# Patient Record
Sex: Female | Born: 1935
Health system: Southern US, Community
[De-identification: ages and names within clinical notes are randomized; demographics above are authoritative.]

## PROBLEM LIST (undated history)

## (undated) DIAGNOSIS — M1990 Unspecified osteoarthritis, unspecified site: Secondary | ICD-10-CM

## (undated) DIAGNOSIS — D649 Anemia, unspecified: Secondary | ICD-10-CM

## (undated) DIAGNOSIS — I10 Essential (primary) hypertension: Secondary | ICD-10-CM

## (undated) DIAGNOSIS — D126 Benign neoplasm of colon, unspecified: Secondary | ICD-10-CM

## (undated) DIAGNOSIS — K219 Gastro-esophageal reflux disease without esophagitis: Secondary | ICD-10-CM

## (undated) DIAGNOSIS — K449 Diaphragmatic hernia without obstruction or gangrene: Secondary | ICD-10-CM

## (undated) DIAGNOSIS — IMO0001 Reserved for inherently not codable concepts without codable children: Secondary | ICD-10-CM

## (undated) DIAGNOSIS — Z8719 Personal history of other diseases of the digestive system: Secondary | ICD-10-CM

## (undated) DIAGNOSIS — Z9889 Other specified postprocedural states: Secondary | ICD-10-CM

## (undated) DIAGNOSIS — E119 Type 2 diabetes mellitus without complications: Secondary | ICD-10-CM

## (undated) DIAGNOSIS — E785 Hyperlipidemia, unspecified: Secondary | ICD-10-CM

## (undated) DIAGNOSIS — Z951 Presence of aortocoronary bypass graft: Secondary | ICD-10-CM

## (undated) DIAGNOSIS — G4700 Insomnia, unspecified: Secondary | ICD-10-CM

## (undated) DIAGNOSIS — I2510 Atherosclerotic heart disease of native coronary artery without angina pectoris: Secondary | ICD-10-CM

## (undated) HISTORY — DX: Presence of aortocoronary bypass graft: Z95.1

## (undated) HISTORY — DX: Diaphragmatic hernia without obstruction or gangrene: K44.9

## (undated) HISTORY — DX: Personal history of other diseases of the digestive system: Z87.19

## (undated) HISTORY — PX: HERNIA REPAIR: SHX51

## (undated) HISTORY — DX: Other specified postprocedural states: Z98.890

## (undated) HISTORY — DX: Unspecified osteoarthritis, unspecified site: M19.90

## (undated) HISTORY — DX: Gastro-esophageal reflux disease without esophagitis: K21.9

## (undated) HISTORY — PX: APPENDECTOMY: SHX54

## (undated) HISTORY — DX: Hyperlipidemia, unspecified: E78.5

## (undated) HISTORY — PX: CATARACT EXTRACTION, BILATERAL: SHX1313

## (undated) HISTORY — PX: ROTATOR CUFF REPAIR: SHX139

## (undated) HISTORY — DX: Benign neoplasm of colon, unspecified: D12.6

## (undated) HISTORY — PX: REPLACEMENT TOTAL KNEE BILATERAL: SUR1225

## (undated) HISTORY — DX: Reserved for inherently not codable concepts without codable children: IMO0001

## (undated) HISTORY — DX: Anemia, unspecified: D64.9

## (undated) HISTORY — DX: Type 2 diabetes mellitus without complications: E11.9

## (undated) HISTORY — DX: Atherosclerotic heart disease of native coronary artery without angina pectoris: I25.10

## (undated) HISTORY — PX: JOINT REPLACEMENT: SHX530

## (undated) HISTORY — PX: OVARIAN CYST REMOVAL: SHX89

## (undated) HISTORY — DX: Essential (primary) hypertension: I10

---

## 1979-04-21 HISTORY — PX: ABDOMINAL HYSTERECTOMY: SHX81

## 2000-08-20 ENCOUNTER — Encounter: Payer: Self-pay | Admitting: Gastroenterology

## 2000-10-11 ENCOUNTER — Encounter: Admission: RE | Admit: 2000-10-11 | Discharge: 2000-11-25 | Payer: Self-pay | Admitting: Internal Medicine

## 2000-10-18 ENCOUNTER — Encounter: Admission: RE | Admit: 2000-10-18 | Discharge: 2000-10-18 | Payer: Self-pay | Admitting: Orthopedic Surgery

## 2000-10-18 ENCOUNTER — Encounter: Payer: Self-pay | Admitting: Orthopedic Surgery

## 2000-10-29 ENCOUNTER — Encounter: Payer: Self-pay | Admitting: Gastroenterology

## 2000-10-29 DIAGNOSIS — K449 Diaphragmatic hernia without obstruction or gangrene: Secondary | ICD-10-CM | POA: Insufficient documentation

## 2000-11-30 ENCOUNTER — Encounter: Admission: RE | Admit: 2000-11-30 | Discharge: 2000-12-16 | Payer: Self-pay | Admitting: Internal Medicine

## 2001-02-01 ENCOUNTER — Ambulatory Visit (HOSPITAL_COMMUNITY): Admission: RE | Admit: 2001-02-01 | Discharge: 2001-02-01 | Payer: Self-pay | Admitting: Internal Medicine

## 2001-02-01 ENCOUNTER — Encounter: Payer: Self-pay | Admitting: Internal Medicine

## 2001-03-08 ENCOUNTER — Encounter: Admission: RE | Admit: 2001-03-08 | Discharge: 2001-04-05 | Payer: Self-pay | Admitting: Internal Medicine

## 2001-09-19 ENCOUNTER — Encounter: Payer: Self-pay | Admitting: Internal Medicine

## 2001-09-19 ENCOUNTER — Encounter: Admission: RE | Admit: 2001-09-19 | Discharge: 2001-09-19 | Payer: Self-pay | Admitting: Internal Medicine

## 2002-02-28 ENCOUNTER — Encounter: Admission: RE | Admit: 2002-02-28 | Discharge: 2002-05-29 | Payer: Self-pay | Admitting: Internal Medicine

## 2002-04-20 ENCOUNTER — Encounter: Payer: Self-pay | Admitting: Internal Medicine

## 2002-04-20 LAB — CONVERTED CEMR LAB: Pap Smear: NORMAL

## 2002-05-03 ENCOUNTER — Encounter (HOSPITAL_BASED_OUTPATIENT_CLINIC_OR_DEPARTMENT_OTHER): Admission: RE | Admit: 2002-05-03 | Discharge: 2002-08-01 | Payer: Self-pay | Admitting: Internal Medicine

## 2002-08-21 ENCOUNTER — Other Ambulatory Visit: Admission: RE | Admit: 2002-08-21 | Discharge: 2002-08-21 | Payer: Self-pay | Admitting: Obstetrics and Gynecology

## 2002-09-19 ENCOUNTER — Encounter: Payer: Self-pay | Admitting: Obstetrics and Gynecology

## 2002-09-19 ENCOUNTER — Encounter: Admission: RE | Admit: 2002-09-19 | Discharge: 2002-09-19 | Payer: Self-pay | Admitting: Obstetrics and Gynecology

## 2002-11-17 ENCOUNTER — Encounter: Admission: RE | Admit: 2002-11-17 | Discharge: 2002-11-17 | Payer: Self-pay | Admitting: Internal Medicine

## 2002-11-17 ENCOUNTER — Encounter: Payer: Self-pay | Admitting: Internal Medicine

## 2003-01-25 ENCOUNTER — Encounter: Admission: RE | Admit: 2003-01-25 | Discharge: 2003-03-20 | Payer: Self-pay | Admitting: Internal Medicine

## 2003-04-30 ENCOUNTER — Encounter: Admission: RE | Admit: 2003-04-30 | Discharge: 2003-07-29 | Payer: Self-pay

## 2003-11-22 ENCOUNTER — Encounter: Admission: RE | Admit: 2003-11-22 | Discharge: 2003-11-22 | Payer: Self-pay | Admitting: Internal Medicine

## 2004-01-24 ENCOUNTER — Encounter: Admission: RE | Admit: 2004-01-24 | Discharge: 2004-04-23 | Payer: Self-pay | Admitting: Family Medicine

## 2004-03-10 ENCOUNTER — Ambulatory Visit: Payer: Self-pay | Admitting: Internal Medicine

## 2004-04-04 ENCOUNTER — Ambulatory Visit: Payer: Self-pay | Admitting: Family Medicine

## 2004-04-10 ENCOUNTER — Ambulatory Visit: Payer: Self-pay | Admitting: Family Medicine

## 2004-05-21 ENCOUNTER — Encounter: Admission: RE | Admit: 2004-05-21 | Discharge: 2004-07-08 | Payer: Self-pay | Admitting: Family Medicine

## 2004-06-05 ENCOUNTER — Ambulatory Visit: Payer: Self-pay | Admitting: Internal Medicine

## 2004-06-05 ENCOUNTER — Encounter: Admission: RE | Admit: 2004-06-05 | Discharge: 2004-06-05 | Payer: Self-pay | Admitting: Internal Medicine

## 2004-06-19 ENCOUNTER — Ambulatory Visit: Payer: Self-pay | Admitting: Internal Medicine

## 2004-08-08 ENCOUNTER — Ambulatory Visit: Payer: Self-pay | Admitting: Internal Medicine

## 2004-08-08 ENCOUNTER — Encounter: Admission: RE | Admit: 2004-08-08 | Discharge: 2004-08-08 | Payer: Self-pay | Admitting: Internal Medicine

## 2004-08-22 ENCOUNTER — Encounter: Admission: RE | Admit: 2004-08-22 | Discharge: 2004-08-22 | Payer: Self-pay | Admitting: Internal Medicine

## 2004-08-22 ENCOUNTER — Ambulatory Visit: Payer: Self-pay | Admitting: Internal Medicine

## 2004-09-08 ENCOUNTER — Ambulatory Visit: Payer: Self-pay | Admitting: Internal Medicine

## 2004-10-06 ENCOUNTER — Ambulatory Visit: Payer: Self-pay | Admitting: Internal Medicine

## 2004-11-19 ENCOUNTER — Ambulatory Visit: Payer: Self-pay | Admitting: Internal Medicine

## 2004-12-08 ENCOUNTER — Encounter: Admission: RE | Admit: 2004-12-08 | Discharge: 2004-12-08 | Payer: Self-pay | Admitting: Internal Medicine

## 2004-12-24 ENCOUNTER — Ambulatory Visit: Payer: Self-pay | Admitting: Internal Medicine

## 2005-03-02 ENCOUNTER — Ambulatory Visit: Payer: Self-pay | Admitting: Internal Medicine

## 2005-03-04 ENCOUNTER — Ambulatory Visit: Payer: Self-pay | Admitting: Internal Medicine

## 2005-06-01 ENCOUNTER — Ambulatory Visit: Payer: Self-pay | Admitting: Internal Medicine

## 2005-06-04 ENCOUNTER — Ambulatory Visit: Payer: Self-pay | Admitting: Internal Medicine

## 2005-06-26 ENCOUNTER — Ambulatory Visit: Payer: Self-pay | Admitting: Internal Medicine

## 2005-06-29 ENCOUNTER — Encounter: Admission: RE | Admit: 2005-06-29 | Discharge: 2005-06-29 | Payer: Self-pay | Admitting: Internal Medicine

## 2005-12-09 ENCOUNTER — Encounter: Admission: RE | Admit: 2005-12-09 | Discharge: 2005-12-09 | Payer: Self-pay | Admitting: Family Medicine

## 2006-12-13 ENCOUNTER — Encounter: Admission: RE | Admit: 2006-12-13 | Discharge: 2006-12-13 | Payer: Self-pay | Admitting: Family Medicine

## 2007-01-20 ENCOUNTER — Encounter: Admission: RE | Admit: 2007-01-20 | Discharge: 2007-01-20 | Payer: Self-pay | Admitting: Family Medicine

## 2007-02-11 ENCOUNTER — Ambulatory Visit: Payer: Self-pay | Admitting: Gastroenterology

## 2007-02-16 ENCOUNTER — Ambulatory Visit: Payer: Self-pay | Admitting: Gastroenterology

## 2007-03-02 ENCOUNTER — Encounter: Payer: Self-pay | Admitting: Gastroenterology

## 2007-03-02 ENCOUNTER — Ambulatory Visit: Payer: Self-pay | Admitting: Gastroenterology

## 2007-03-02 DIAGNOSIS — D126 Benign neoplasm of colon, unspecified: Secondary | ICD-10-CM

## 2007-03-02 DIAGNOSIS — K648 Other hemorrhoids: Secondary | ICD-10-CM | POA: Insufficient documentation

## 2007-03-02 DIAGNOSIS — K5730 Diverticulosis of large intestine without perforation or abscess without bleeding: Secondary | ICD-10-CM | POA: Insufficient documentation

## 2007-03-02 HISTORY — DX: Benign neoplasm of colon, unspecified: D12.6

## 2007-03-02 LAB — HM COLONOSCOPY

## 2007-06-15 DIAGNOSIS — E785 Hyperlipidemia, unspecified: Secondary | ICD-10-CM | POA: Insufficient documentation

## 2007-06-15 DIAGNOSIS — M129 Arthropathy, unspecified: Secondary | ICD-10-CM | POA: Insufficient documentation

## 2007-06-15 DIAGNOSIS — K219 Gastro-esophageal reflux disease without esophagitis: Secondary | ICD-10-CM | POA: Insufficient documentation

## 2007-06-15 DIAGNOSIS — I10 Essential (primary) hypertension: Secondary | ICD-10-CM | POA: Insufficient documentation

## 2007-06-15 DIAGNOSIS — M797 Fibromyalgia: Secondary | ICD-10-CM | POA: Insufficient documentation

## 2007-12-16 ENCOUNTER — Encounter: Admission: RE | Admit: 2007-12-16 | Discharge: 2007-12-16 | Payer: Self-pay | Admitting: Family Medicine

## 2008-04-20 HISTORY — PX: CORONARY ARTERY BYPASS GRAFT: SHX141

## 2008-05-16 ENCOUNTER — Inpatient Hospital Stay (HOSPITAL_COMMUNITY): Admission: EM | Admit: 2008-05-16 | Discharge: 2008-05-26 | Payer: Self-pay | Admitting: Emergency Medicine

## 2008-05-16 ENCOUNTER — Ambulatory Visit: Payer: Self-pay | Admitting: Cardiothoracic Surgery

## 2008-05-17 ENCOUNTER — Ambulatory Visit: Payer: Self-pay | Admitting: Internal Medicine

## 2008-05-17 ENCOUNTER — Encounter (INDEPENDENT_AMBULATORY_CARE_PROVIDER_SITE_OTHER): Payer: Self-pay | Admitting: Internal Medicine

## 2008-05-18 ENCOUNTER — Encounter: Payer: Self-pay | Admitting: Cardiothoracic Surgery

## 2008-06-08 ENCOUNTER — Ambulatory Visit: Payer: Self-pay | Admitting: Internal Medicine

## 2008-06-09 DIAGNOSIS — I2510 Atherosclerotic heart disease of native coronary artery without angina pectoris: Secondary | ICD-10-CM | POA: Insufficient documentation

## 2008-06-09 HISTORY — DX: Atherosclerotic heart disease of native coronary artery without angina pectoris: I25.10

## 2008-06-18 ENCOUNTER — Ambulatory Visit: Payer: Self-pay | Admitting: Cardiothoracic Surgery

## 2008-06-18 ENCOUNTER — Encounter: Admission: RE | Admit: 2008-06-18 | Discharge: 2008-06-18 | Payer: Self-pay | Admitting: Cardiothoracic Surgery

## 2008-06-21 ENCOUNTER — Encounter (HOSPITAL_COMMUNITY): Admission: RE | Admit: 2008-06-21 | Discharge: 2008-09-19 | Payer: Self-pay | Admitting: Internal Medicine

## 2008-06-28 ENCOUNTER — Ambulatory Visit: Payer: Self-pay | Admitting: Internal Medicine

## 2008-07-23 ENCOUNTER — Ambulatory Visit: Payer: Self-pay | Admitting: Internal Medicine

## 2008-07-23 ENCOUNTER — Encounter: Payer: Self-pay | Admitting: Internal Medicine

## 2008-07-23 LAB — CONVERTED CEMR LAB
Cholesterol, target level: 200 mg/dL
HDL goal, serum: 40 mg/dL
LDL Goal: 70 mg/dL

## 2008-08-07 ENCOUNTER — Emergency Department (HOSPITAL_COMMUNITY): Admission: EM | Admit: 2008-08-07 | Discharge: 2008-08-07 | Payer: Self-pay | Admitting: Emergency Medicine

## 2008-08-13 ENCOUNTER — Telehealth: Payer: Self-pay | Admitting: Internal Medicine

## 2008-08-14 ENCOUNTER — Ambulatory Visit: Payer: Self-pay | Admitting: Internal Medicine

## 2008-08-16 LAB — CONVERTED CEMR LAB
AST: 23 units/L (ref 0–37)
Cholesterol: 145 mg/dL (ref 0–200)
HDL: 61 mg/dL (ref >39.00)
LDL Cholesterol: 70 mg/dL (ref 0–99)
Total CHOL/HDL Ratio: 2
Triglycerides: 70 mg/dL (ref 0.0–149.0)
VLDL: 14 mg/dL (ref 0.0–40.0)

## 2008-09-03 ENCOUNTER — Telehealth: Payer: Self-pay | Admitting: Internal Medicine

## 2008-09-10 ENCOUNTER — Encounter: Payer: Self-pay | Admitting: Internal Medicine

## 2008-09-18 ENCOUNTER — Encounter: Payer: Self-pay | Admitting: Internal Medicine

## 2008-09-18 DIAGNOSIS — Z860100 Personal history of colon polyps, unspecified: Secondary | ICD-10-CM | POA: Insufficient documentation

## 2008-09-26 ENCOUNTER — Telehealth: Payer: Self-pay | Admitting: Internal Medicine

## 2008-10-05 ENCOUNTER — Ambulatory Visit: Payer: Self-pay | Admitting: Internal Medicine

## 2008-10-08 LAB — CONVERTED CEMR LAB
BUN: 13 mg/dL (ref 6–23)
Basophils Absolute: 0.1 10*3/uL (ref 0.0–0.1)
Basophils Relative: 0.9 % (ref 0.0–3.0)
CO2: 29 meq/L (ref 19–32)
Calcium: 9.2 mg/dL (ref 8.4–10.5)
Chloride: 107 meq/L (ref 96–112)
Creatinine, Ser: 0.8 mg/dL (ref 0.4–1.2)
Eosinophils Absolute: 0.1 10*3/uL (ref 0.0–0.7)
Eosinophils Relative: 1.3 % (ref 0.0–5.0)
GFR calc non Af Amer: 89.61 mL/min (ref >60)
Glucose, Bld: 97 mg/dL (ref 70–99)
HCT: 27.3 % — ABNORMAL LOW (ref 36.0–46.0)
Hemoglobin: 9.2 g/dL — ABNORMAL LOW (ref 12.0–15.0)
Lymphocytes Relative: 42.8 % (ref 12.0–46.0)
Lymphs Abs: 2.4 10*3/uL (ref 0.7–4.0)
MCHC: 33.7 g/dL (ref 30.0–36.0)
MCV: 85.3 fL (ref 78.0–100.0)
Monocytes Absolute: 0.5 10*3/uL (ref 0.1–1.0)
Monocytes Relative: 9.2 % (ref 3.0–12.0)
Neutro Abs: 2.6 10*3/uL (ref 1.4–7.7)
Neutrophils Relative %: 45.8 % (ref 43.0–77.0)
Platelets: 255 10*3/uL (ref 150.0–400.0)
Potassium: 3.9 meq/L (ref 3.5–5.1)
RBC: 3.2 M/uL — ABNORMAL LOW (ref 3.87–5.11)
RDW: 15.1 % — ABNORMAL HIGH (ref 11.5–14.6)
Sodium: 143 meq/L (ref 135–145)
WBC: 5.7 10*3/uL (ref 4.5–10.5)

## 2008-10-15 ENCOUNTER — Ambulatory Visit: Payer: Self-pay | Admitting: Internal Medicine

## 2008-10-15 ENCOUNTER — Encounter (INDEPENDENT_AMBULATORY_CARE_PROVIDER_SITE_OTHER): Payer: Self-pay | Admitting: *Deleted

## 2008-10-15 DIAGNOSIS — D649 Anemia, unspecified: Secondary | ICD-10-CM | POA: Insufficient documentation

## 2008-10-15 LAB — CONVERTED CEMR LAB
Basophils Absolute: 0 10*3/uL (ref 0.0–0.1)
Basophils Relative: 0.4 % (ref 0.0–3.0)
Eosinophils Absolute: 0.1 10*3/uL (ref 0.0–0.7)
Eosinophils Relative: 2.5 % (ref 0.0–5.0)
Folate: >20 ng/mL
HCT: 32.6 % — ABNORMAL LOW (ref 36.0–46.0)
Hemoglobin: 10.6 g/dL — ABNORMAL LOW (ref 12.0–15.0)
Hgb A1c MFr Bld: 6 % (ref 4.6–6.5)
Iron: 95 ug/dL (ref 42–145)
Lymphocytes Relative: 39.8 % (ref 12.0–46.0)
Lymphs Abs: 2 10*3/uL (ref 0.7–4.0)
MCHC: 32.6 g/dL (ref 30.0–36.0)
MCV: 85.3 fL (ref 78.0–100.0)
Monocytes Absolute: 0.5 10*3/uL (ref 0.1–1.0)
Monocytes Relative: 10.1 % (ref 3.0–12.0)
Neutro Abs: 2.5 10*3/uL (ref 1.4–7.7)
Neutrophils Relative %: 47.2 % (ref 43.0–77.0)
Platelets: 213 10*3/uL (ref 150.0–400.0)
RBC: 3.82 M/uL — ABNORMAL LOW (ref 3.87–5.11)
RDW: 15.1 % — ABNORMAL HIGH (ref 11.5–14.6)
Saturation Ratios: 20.9 % (ref 20.0–50.0)
Transferrin: 324.5 mg/dL (ref 212.0–360.0)
Vitamin B-12: 568 pg/mL (ref 211–911)
WBC: 5.1 10*3/uL (ref 4.5–10.5)

## 2008-11-27 ENCOUNTER — Ambulatory Visit: Payer: Self-pay | Admitting: Internal Medicine

## 2008-11-27 ENCOUNTER — Encounter (INDEPENDENT_AMBULATORY_CARE_PROVIDER_SITE_OTHER): Payer: Self-pay | Admitting: *Deleted

## 2008-11-27 LAB — CONVERTED CEMR LAB
Basophils Absolute: 0.1 10*3/uL (ref 0.0–0.1)
Basophils Relative: 1.7 % (ref 0.0–3.0)
Eosinophils Absolute: 0.1 10*3/uL (ref 0.0–0.7)
Eosinophils Relative: 2.6 % (ref 0.0–5.0)
HCT: 34.1 % — ABNORMAL LOW (ref 36.0–46.0)
Hemoglobin: 11.3 g/dL — ABNORMAL LOW (ref 12.0–15.0)
Lymphocytes Relative: 37.1 % (ref 12.0–46.0)
Lymphs Abs: 2.1 10*3/uL (ref 0.7–4.0)
MCHC: 33.1 g/dL (ref 30.0–36.0)
MCV: 86.7 fL (ref 78.0–100.0)
Monocytes Absolute: 0.4 10*3/uL (ref 0.1–1.0)
Monocytes Relative: 6.9 % (ref 3.0–12.0)
Neutro Abs: 2.9 10*3/uL (ref 1.4–7.7)
Neutrophils Relative %: 51.7 % (ref 43.0–77.0)
Platelets: 189 10*3/uL (ref 150.0–400.0)
RBC: 3.94 M/uL (ref 3.87–5.11)
RDW: 17.4 % — ABNORMAL HIGH (ref 11.5–14.6)
WBC: 5.6 10*3/uL (ref 4.5–10.5)

## 2008-12-05 ENCOUNTER — Telehealth (INDEPENDENT_AMBULATORY_CARE_PROVIDER_SITE_OTHER): Payer: Self-pay | Admitting: *Deleted

## 2008-12-10 ENCOUNTER — Encounter: Payer: Self-pay | Admitting: Internal Medicine

## 2008-12-21 ENCOUNTER — Ambulatory Visit: Payer: Self-pay | Admitting: Internal Medicine

## 2008-12-21 DIAGNOSIS — K922 Gastrointestinal hemorrhage, unspecified: Secondary | ICD-10-CM | POA: Insufficient documentation

## 2008-12-21 LAB — CONVERTED CEMR LAB
Basophils Absolute: 0 10*3/uL (ref 0.0–0.1)
Basophils Relative: 1 % (ref 0.0–3.0)
Eosinophils Absolute: 0.1 10*3/uL (ref 0.0–0.7)
Eosinophils Relative: 2 % (ref 0.0–5.0)
HCT: 38.5 % (ref 36.0–46.0)
Hemoglobin: 12.7 g/dL (ref 12.0–15.0)
Lymphocytes Relative: 39.9 % (ref 12.0–46.0)
Lymphs Abs: 1.9 10*3/uL (ref 0.7–4.0)
MCHC: 33 g/dL (ref 30.0–36.0)
MCV: 89.5 fL (ref 78.0–100.0)
Monocytes Absolute: 0.3 10*3/uL (ref 0.1–1.0)
Monocytes Relative: 7 % (ref 3.0–12.0)
Neutro Abs: 2.4 10*3/uL (ref 1.4–7.7)
Neutrophils Relative %: 50.1 % (ref 43.0–77.0)
Platelets: 191 10*3/uL (ref 150.0–400.0)
RBC: 4.3 M/uL (ref 3.87–5.11)
RDW: 17.2 % — ABNORMAL HIGH (ref 11.5–14.6)
WBC: 4.7 10*3/uL (ref 4.5–10.5)

## 2008-12-25 ENCOUNTER — Encounter: Admission: RE | Admit: 2008-12-25 | Discharge: 2008-12-25 | Payer: Self-pay | Admitting: Orthopedic Surgery

## 2008-12-27 ENCOUNTER — Encounter: Payer: Self-pay | Admitting: Internal Medicine

## 2008-12-27 ENCOUNTER — Ambulatory Visit: Payer: Self-pay | Admitting: Internal Medicine

## 2009-01-02 ENCOUNTER — Telehealth: Payer: Self-pay | Admitting: Internal Medicine

## 2009-01-02 ENCOUNTER — Ambulatory Visit: Payer: Self-pay | Admitting: Internal Medicine

## 2009-01-04 ENCOUNTER — Telehealth: Payer: Self-pay | Admitting: Internal Medicine

## 2009-01-09 ENCOUNTER — Encounter: Admission: RE | Admit: 2009-01-09 | Discharge: 2009-01-09 | Payer: Self-pay | Admitting: Internal Medicine

## 2009-01-09 ENCOUNTER — Encounter: Payer: Self-pay | Admitting: Internal Medicine

## 2009-01-15 ENCOUNTER — Encounter: Payer: Self-pay | Admitting: Internal Medicine

## 2009-01-15 ENCOUNTER — Encounter: Admission: RE | Admit: 2009-01-15 | Discharge: 2009-01-15 | Payer: Self-pay | Admitting: Internal Medicine

## 2009-01-16 ENCOUNTER — Ambulatory Visit: Payer: Self-pay | Admitting: Gastroenterology

## 2009-01-16 DIAGNOSIS — R195 Other fecal abnormalities: Secondary | ICD-10-CM | POA: Insufficient documentation

## 2009-01-17 ENCOUNTER — Ambulatory Visit: Payer: Self-pay | Admitting: Internal Medicine

## 2009-01-17 DIAGNOSIS — E119 Type 2 diabetes mellitus without complications: Secondary | ICD-10-CM

## 2009-01-17 DIAGNOSIS — E1149 Type 2 diabetes mellitus with other diabetic neurological complication: Secondary | ICD-10-CM | POA: Insufficient documentation

## 2009-01-18 ENCOUNTER — Telehealth: Payer: Self-pay | Admitting: Internal Medicine

## 2009-01-24 ENCOUNTER — Telehealth: Payer: Self-pay | Admitting: Gastroenterology

## 2009-01-24 ENCOUNTER — Encounter: Payer: Self-pay | Admitting: Internal Medicine

## 2009-01-25 ENCOUNTER — Ambulatory Visit: Payer: Self-pay | Admitting: Gastroenterology

## 2009-01-25 ENCOUNTER — Encounter: Payer: Self-pay | Admitting: Gastroenterology

## 2009-01-25 ENCOUNTER — Telehealth (INDEPENDENT_AMBULATORY_CARE_PROVIDER_SITE_OTHER): Payer: Self-pay | Admitting: *Deleted

## 2009-01-28 ENCOUNTER — Encounter: Payer: Self-pay | Admitting: Gastroenterology

## 2009-01-30 ENCOUNTER — Telehealth: Payer: Self-pay | Admitting: Gastroenterology

## 2009-02-04 ENCOUNTER — Ambulatory Visit (HOSPITAL_BASED_OUTPATIENT_CLINIC_OR_DEPARTMENT_OTHER): Admission: RE | Admit: 2009-02-04 | Discharge: 2009-02-04 | Payer: Self-pay | Admitting: General Surgery

## 2009-02-04 ENCOUNTER — Encounter (INDEPENDENT_AMBULATORY_CARE_PROVIDER_SITE_OTHER): Payer: Self-pay | Admitting: General Surgery

## 2009-02-06 ENCOUNTER — Telehealth: Payer: Self-pay | Admitting: Internal Medicine

## 2009-02-20 ENCOUNTER — Ambulatory Visit: Payer: Self-pay | Admitting: Internal Medicine

## 2009-02-20 DIAGNOSIS — B351 Tinea unguium: Secondary | ICD-10-CM | POA: Insufficient documentation

## 2009-02-20 DIAGNOSIS — L851 Acquired keratosis [keratoderma] palmaris et plantaris: Secondary | ICD-10-CM | POA: Insufficient documentation

## 2009-02-21 ENCOUNTER — Telehealth: Payer: Self-pay | Admitting: Internal Medicine

## 2009-02-25 ENCOUNTER — Ambulatory Visit: Payer: Self-pay | Admitting: Internal Medicine

## 2009-03-12 ENCOUNTER — Ambulatory Visit: Payer: Self-pay | Admitting: Internal Medicine

## 2009-03-12 ENCOUNTER — Ambulatory Visit: Payer: Self-pay | Admitting: Gastroenterology

## 2009-03-12 DIAGNOSIS — K2990 Gastroduodenitis, unspecified, without bleeding: Secondary | ICD-10-CM

## 2009-03-12 DIAGNOSIS — K2970 Gastritis, unspecified, without bleeding: Secondary | ICD-10-CM | POA: Insufficient documentation

## 2009-03-18 ENCOUNTER — Telehealth: Payer: Self-pay | Admitting: Internal Medicine

## 2009-03-18 LAB — CONVERTED CEMR LAB
AST: 24 units/L (ref 0–37)
BUN: 12 mg/dL (ref 6–23)
CO2: 29 meq/L (ref 19–32)
Calcium: 9.4 mg/dL (ref 8.4–10.5)
Chloride: 107 meq/L (ref 96–112)
Cholesterol: 206 mg/dL — ABNORMAL HIGH (ref 0–200)
Creatinine, Ser: 0.8 mg/dL (ref 0.4–1.2)
Direct LDL: 94.8 mg/dL
GFR calc non Af Amer: 89.51 mL/min (ref >60)
Glucose, Bld: 104 mg/dL — ABNORMAL HIGH (ref 70–99)
HDL: 94.3 mg/dL (ref >39.00)
Potassium: 4.1 meq/L (ref 3.5–5.1)
Sodium: 143 meq/L (ref 135–145)
Total CHOL/HDL Ratio: 2
Triglycerides: 72 mg/dL (ref 0.0–149.0)
VLDL: 14.4 mg/dL (ref 0.0–40.0)

## 2009-04-03 ENCOUNTER — Telehealth (INDEPENDENT_AMBULATORY_CARE_PROVIDER_SITE_OTHER): Payer: Self-pay | Admitting: *Deleted

## 2009-04-03 ENCOUNTER — Telehealth: Payer: Self-pay | Admitting: Internal Medicine

## 2009-04-08 ENCOUNTER — Telehealth: Payer: Self-pay | Admitting: Internal Medicine

## 2009-04-08 ENCOUNTER — Ambulatory Visit: Payer: Self-pay | Admitting: Internal Medicine

## 2009-04-08 DIAGNOSIS — R51 Headache: Secondary | ICD-10-CM | POA: Insufficient documentation

## 2009-04-08 DIAGNOSIS — R519 Headache, unspecified: Secondary | ICD-10-CM | POA: Insufficient documentation

## 2009-04-10 ENCOUNTER — Ambulatory Visit: Payer: Self-pay | Admitting: Internal Medicine

## 2009-04-15 ENCOUNTER — Encounter (INDEPENDENT_AMBULATORY_CARE_PROVIDER_SITE_OTHER): Payer: Self-pay | Admitting: *Deleted

## 2009-04-15 ENCOUNTER — Ambulatory Visit: Payer: Self-pay | Admitting: Internal Medicine

## 2009-04-15 DIAGNOSIS — R002 Palpitations: Secondary | ICD-10-CM | POA: Insufficient documentation

## 2009-04-15 LAB — CONVERTED CEMR LAB
BUN: 14 mg/dL (ref 6–23)
Basophils Absolute: 0 10*3/uL (ref 0.0–0.1)
Basophils Relative: 0.3 % (ref 0.0–3.0)
CO2: 27 meq/L (ref 19–32)
Calcium: 9.5 mg/dL (ref 8.4–10.5)
Chloride: 104 meq/L (ref 96–112)
Creatinine, Ser: 0.8 mg/dL (ref 0.4–1.2)
Eosinophils Absolute: 0.1 10*3/uL (ref 0.0–0.7)
Eosinophils Relative: 1.3 % (ref 0.0–5.0)
GFR calc non Af Amer: 89.49 mL/min (ref >60)
Glucose, Bld: 78 mg/dL (ref 70–99)
HCT: 38.4 % (ref 36.0–46.0)
Hemoglobin: 12.8 g/dL (ref 12.0–15.0)
Lymphocytes Relative: 44.3 % (ref 12.0–46.0)
Lymphs Abs: 2.6 10*3/uL (ref 0.7–4.0)
MCHC: 33.2 g/dL (ref 30.0–36.0)
MCV: 93.9 fL (ref 78.0–100.0)
Monocytes Absolute: 0.3 10*3/uL (ref 0.1–1.0)
Monocytes Relative: 5.1 % (ref 3.0–12.0)
Neutro Abs: 2.8 10*3/uL (ref 1.4–7.7)
Neutrophils Relative %: 49 % (ref 43.0–77.0)
Platelets: 174 10*3/uL (ref 150.0–400.0)
Potassium: 4.6 meq/L (ref 3.5–5.1)
RBC: 4.09 M/uL (ref 3.87–5.11)
RDW: 14.5 % (ref 11.5–14.6)
Sodium: 139 meq/L (ref 135–145)
TSH: 0.76 microintl units/mL (ref 0.35–5.50)
WBC: 5.8 10*3/uL (ref 4.5–10.5)

## 2009-05-09 ENCOUNTER — Ambulatory Visit: Payer: Self-pay | Admitting: Internal Medicine

## 2009-05-27 ENCOUNTER — Ambulatory Visit: Payer: Self-pay | Admitting: Internal Medicine

## 2009-07-29 ENCOUNTER — Ambulatory Visit: Payer: Self-pay | Admitting: Internal Medicine

## 2009-07-30 ENCOUNTER — Telehealth: Payer: Self-pay | Admitting: Internal Medicine

## 2009-08-13 ENCOUNTER — Ambulatory Visit: Payer: Self-pay | Admitting: Internal Medicine

## 2009-08-19 ENCOUNTER — Telehealth (INDEPENDENT_AMBULATORY_CARE_PROVIDER_SITE_OTHER): Payer: Self-pay | Admitting: *Deleted

## 2009-09-18 ENCOUNTER — Ambulatory Visit: Payer: Self-pay | Admitting: Internal Medicine

## 2009-09-18 DIAGNOSIS — R062 Wheezing: Secondary | ICD-10-CM | POA: Insufficient documentation

## 2009-09-18 DIAGNOSIS — G4700 Insomnia, unspecified: Secondary | ICD-10-CM | POA: Insufficient documentation

## 2009-09-23 ENCOUNTER — Ambulatory Visit: Payer: Self-pay | Admitting: Internal Medicine

## 2009-09-30 ENCOUNTER — Ambulatory Visit: Payer: Self-pay | Admitting: Internal Medicine

## 2009-09-30 LAB — CONVERTED CEMR LAB
ALT: 21 units/L (ref 0–35)
AST: 19 units/L (ref 0–37)
Albumin: 3.6 g/dL (ref 3.5–5.2)
Alkaline Phosphatase: 49 units/L (ref 39–117)
Bilirubin, Direct: 0.1 mg/dL (ref 0.0–0.3)
Cholesterol: 251 mg/dL — ABNORMAL HIGH (ref 0–200)
Creatinine,U: 56.2 mg/dL
Direct LDL: 115.3 mg/dL
HDL: 115 mg/dL (ref >39.00)
Hgb A1c MFr Bld: 6.2 % (ref 4.6–6.5)
Microalb Creat Ratio: 2 mg/g (ref 0.0–30.0)
Microalb, Ur: 1.1 mg/dL (ref 0.0–1.9)
Total Bilirubin: 0.5 mg/dL (ref 0.3–1.2)
Total CHOL/HDL Ratio: 2
Total Protein: 6.1 g/dL (ref 6.0–8.3)
Triglycerides: 143 mg/dL (ref 0.0–149.0)
VLDL: 28.6 mg/dL (ref 0.0–40.0)

## 2009-10-15 ENCOUNTER — Telehealth: Payer: Self-pay | Admitting: Internal Medicine

## 2009-10-16 ENCOUNTER — Telehealth: Payer: Self-pay | Admitting: Internal Medicine

## 2009-10-16 ENCOUNTER — Encounter: Payer: Self-pay | Admitting: Internal Medicine

## 2009-11-21 ENCOUNTER — Telehealth: Payer: Self-pay | Admitting: Internal Medicine

## 2009-11-28 ENCOUNTER — Ambulatory Visit: Payer: Self-pay | Admitting: Internal Medicine

## 2009-11-28 DIAGNOSIS — M76899 Other specified enthesopathies of unspecified lower limb, excluding foot: Secondary | ICD-10-CM | POA: Insufficient documentation

## 2009-11-29 ENCOUNTER — Ambulatory Visit: Payer: Self-pay | Admitting: Internal Medicine

## 2009-12-03 ENCOUNTER — Telehealth (INDEPENDENT_AMBULATORY_CARE_PROVIDER_SITE_OTHER): Payer: Self-pay | Admitting: *Deleted

## 2009-12-10 ENCOUNTER — Encounter: Payer: Self-pay | Admitting: Internal Medicine

## 2009-12-13 ENCOUNTER — Encounter: Payer: Self-pay | Admitting: Internal Medicine

## 2009-12-24 ENCOUNTER — Encounter (INDEPENDENT_AMBULATORY_CARE_PROVIDER_SITE_OTHER): Payer: Self-pay | Admitting: *Deleted

## 2010-01-07 ENCOUNTER — Ambulatory Visit: Payer: Self-pay | Admitting: Internal Medicine

## 2010-01-15 ENCOUNTER — Encounter: Payer: Self-pay | Admitting: Internal Medicine

## 2010-01-17 ENCOUNTER — Encounter: Admission: RE | Admit: 2010-01-17 | Discharge: 2010-01-17 | Payer: Self-pay | Admitting: Internal Medicine

## 2010-01-17 LAB — HM MAMMOGRAPHY: HM Mammogram: NEGATIVE

## 2010-02-01 ENCOUNTER — Encounter: Payer: Self-pay | Admitting: Internal Medicine

## 2010-02-18 ENCOUNTER — Telehealth: Payer: Self-pay | Admitting: Internal Medicine

## 2010-02-19 ENCOUNTER — Ambulatory Visit: Payer: Self-pay | Admitting: Gastroenterology

## 2010-02-20 ENCOUNTER — Encounter: Payer: Self-pay | Admitting: Internal Medicine

## 2010-02-24 ENCOUNTER — Encounter: Payer: Self-pay | Admitting: Internal Medicine

## 2010-02-24 LAB — CONVERTED CEMR LAB
Cholesterol: 218 mg/dL — ABNORMAL HIGH (ref 0–200)
HDL: 93 mg/dL (ref >39)
LDL Cholesterol: 102 mg/dL — ABNORMAL HIGH (ref 0–99)
Total CHOL/HDL Ratio: 2.3
Triglycerides: 117 mg/dL (ref <150)
VLDL: 23 mg/dL (ref 0–40)

## 2010-03-03 ENCOUNTER — Telehealth: Payer: Self-pay | Admitting: Internal Medicine

## 2010-03-10 ENCOUNTER — Telehealth: Payer: Self-pay | Admitting: Internal Medicine

## 2010-03-25 ENCOUNTER — Ambulatory Visit: Payer: Self-pay | Admitting: Internal Medicine

## 2010-03-26 LAB — CONVERTED CEMR LAB: Hgb A1c MFr Bld: 6 % (ref 4.6–6.5)

## 2010-03-31 ENCOUNTER — Telehealth: Payer: Self-pay | Admitting: Internal Medicine

## 2010-04-08 ENCOUNTER — Encounter: Payer: Self-pay | Admitting: Internal Medicine

## 2010-04-26 ENCOUNTER — Encounter: Payer: Self-pay | Admitting: Internal Medicine

## 2010-05-11 ENCOUNTER — Encounter: Payer: Self-pay | Admitting: Pulmonary Disease

## 2010-05-20 NOTE — Progress Notes (Signed)
Summary: Rx request  Phone Note Call from Patient Call back at Home Phone [phone]   Caller: Patient Summary of Call: Pt called requesting Rx for Volteran gell. Per pt she has used in the past for Arthritis, Bursitis and Fibromyalgia. Please advise? Initial call taken by: Margaret Pyle, CMA,  March 03, 2010 10:44 AM  Follow-up for Phone Call        ok - med list updated and [facility]x [facility] - thanks Follow-up by: Newt Lukes MD,  March 03, 2010 12:11 PM  Additional Follow-up for Phone Call Additional follow up Details #1::        Pt informed  Additional Follow-up by: Lamar Sprinkles, CMA,  March 03, 2010 12:31 PM    New/Updated Medications: VOLTAREN 1 % GEL (DICLOFENAC SODIUM) apply to affecetd joint four times a day as needed for pain symptoms Prescriptions: VOLTAREN 1 % GEL (DICLOFENAC SODIUM) apply to affecetd joint four times a day as needed for pain symptoms  #1 x 1   Entered and Authorized by:   Newt Lukes MD   Signed by:   Newt Lukes MD on 03/03/2010   Method used:   Electronically to        [facility] ([facility]       [address]       [place], [place]  [address]       Ph: [phone] or [phone]       Fax: [phone]   RxID:   [id number]

## 2010-05-20 NOTE — Assessment & Plan Note (Signed)
Summary: 4-6 MTH FU  STC   Vital Signs:  Patient profile:   77 year old female Height:      60 inches (152.40 cm) Weight:      161.8 pounds (73.55 kg) O2 Sat:      97 % on Room air Temp:     99.1 degrees F (37.28 degrees C) oral Pulse rate:   83 / minute BP sitting:   112 / 72  (left arm) Cuff size:   regular  Vitals Entered By: Orlan Leavens (September 30, 2009 10:26 AM)  O2 Flow:  Room air CC: 4 month follow-up. Pt is c/o ongoing cough. Still coughing up phlegm. Chest tighting up again. She states she stop taking the cough syrup due to side effect made her feel faint. Hoarness Is Patient Diabetic? Yes Did you bring your meter with you today? No Pain Assessment Patient in pain? no        Primary Care Provider:  Newt Lukes, MD  CC:  4 month follow-up. Pt is c/o ongoing cough. Still coughing up phlegm. Chest tighting up again. She states she stop taking the cough syrup due to side effect made her feel faint. Hoarness.  History of Present Illness: seen here x 2 in past 2 weeks by my [employer] for cough/bronchitis and wheeze - unimproved symptoms with zpack, then levaquin or steroids (depomed x 2 + pred pak) now hoarse, no ST or CP - taking PPI as prev rx'd OV and CXR reviewed  also 4-6 mo f/o on chronic issues:  1) HTN - reports compliance with ongoing medical treatment and no changes in medication dose or frequency. denies adverse side effects related to current therapy.   2) dyslipidemia - reports compliance with ongoing medical treatment and no changes in medication dose or frequency. denies adverse side effects related to current therapy.   3) DM2 - reports compliance with ongoing medical treatment and no changes in medication dose or frequency. denies adverse side effects related to current therapy. no hypoglycemia but high readings after steroids use  4) arthritis - no flares in hands or shoulder since participating in water areobics - ?ok to reduce mobic  dose  Clinical Review Panels:  Lipid Management   Cholesterol:  206 (03/12/2009)   LDL (bad choesterol):  70 (08/14/2008)   HDL (good cholesterol):  94.30 (03/12/2009)  Diabetes Management   HgBA1C:  6.0 (10/15/2008)   Creatinine:  0.8 (04/15/2009)   Last Flu Vaccine:  Fluvax 3+ (01/17/2009)  CBC   WBC:  5.8 (04/15/2009)   RBC:  4.09 (04/15/2009)   Hgb:  12.8 (04/15/2009)   Hct:  38.4 (04/15/2009)   Platelets:  174.0 (04/15/2009)   MCV  93.9 (04/15/2009)   MCHC  33.2 (04/15/2009)   RDW  14.5 (04/15/2009)   PMN:  49.0 (04/15/2009)   Lymphs:  44.3 (04/15/2009)   Monos:  5.1 (04/15/2009)   Eosinophils:  1.3 (04/15/2009)   Basophil:  0.3 (04/15/2009)  Complete Metabolic Panel   Glucose:  78 (04/15/2009)   Sodium:  139 (04/15/2009)   Potassium:  4.6 (04/15/2009)   Chloride:  104 (04/15/2009)   CO2:  27 (04/15/2009)   BUN:  14 (04/15/2009)   Creatinine:  0.8 (04/15/2009)   Calcium:  9.5 (04/15/2009)   SGOT (AST):  24 (03/12/2009)   Current Medications (verified): 1)  Norvasc 5 Mg Tabs (Amlodipine Besylate) .Marland Kitchen.. 1 Every Day 2)  Mobic 15 Mg Tabs (Meloxicam) .Marland Kitchen.. 1 Every Day 3)  Simvastatin 40  Mg Tabs (Simvastatin) .... One By Mouth Daily 4)  Metformin Hcl 500 Mg Tabs (Metformin Hcl) .... Take 1 Two Times A Day 5)  Altace 5 Mg Caps (Ramipril) .Marland Kitchen.. 1 Every Day 6)  Fish Oil 1200 Mg Caps (Omega-3 Fatty Acids) .... Take One By Mouth Once Daily 7)  Dexilant 60 Mg Cpdr (Dexlansoprazole) .Marland Kitchen.. 1 By Mouth Once Daily 8)  Multivitamins   Tabs (Multiple Vitamin) .... Daily 9)  Calcium Carbonate   Powd (Calcium Carbonate) .... 600mg  With V It D  Daily 10)  Vitamin C 500 Mg  Tabs (Ascorbic Acid) .... Daily 11)  Vitamin B Complex-C   Caps (B Complex-C) .... Daily 12)  Zinc .... Daily 13)  Selenium .... Daily 14)  Penlac 8 % Soln (Ciclopirox) .... Apply To Affected Nails At Bedtime 15)  Cetirizine Hcl 10 Mg Tabs (Cetirizine Hcl) .... Take 1 By Mouth Once Daily 16)  Tramadol Hcl 50 Mg  Tabs (Tramadol Hcl) .Marland Kitchen.. 1-2 By Mouth Four Times A Day As Needed For Arthritis Pain 17)  Zolpidem Tartrate 5 Mg Tabs (Zolpidem Tartrate) .... 1/2-1 Tab By Mouth At Bedtime As Needed For Sleep 18)  Carvedilol 6.25 Mg Tabs (Carvedilol) .Marland Kitchen.. 1 Tab Two Times A Day 19)  Levaquin 500 Mg Tabs (Levofloxacin) .Marland Kitchen.. 1 By Mouth Once Daily 20)  Proair Hfa 108 (90 Base) Mcg/act Aers (Albuterol Sulfate) .... 2 Puffs Four Times Per Day As Needed Shortness of Breath  Allergies (verified): No Known Drug Allergies  Past History:  Past medical, surgical, family and social histories (including risk factors) reviewed, and no changes noted (except as noted below).  Past Medical History: CORONARY ARTERY DISEASE DYSLIPIDEMIA DIABETES type 2 HYPERTENSION DIVERTICULOSIS, COLON GERD w/ HH H. pylori gastritis treated 02/2009 ARTHRITIS -gout +OA, severe polyarthritis with chronic inflammatory changes Hemorrhoids Tubular adenoma polyp 02/2007 Fibromyalgia  MD roster: [facility] - [facility] (knees), [facility]andler (shoulder)  Past Surgical History: CABG Feb 2010 Inguinal Hernia Repair Hysterectomy (1981) Appendectomy Tubaligation Bilat knee replacements Left RCT Repair Herniated disc (x2)  Ovarian cystectomy Cataract Extraction bilateral   Family History: Reviewed history from 03/12/2009 and no changes required. Mother died of stomach cancer father died of MI age 110 1 of 10 children.  one sister died in 52s of CAD, DM.  One brother died of MI age 66 No FH of Colon Cancer:  Social History: Reviewed history from 09/18/2008 and no changes required. Widowed.  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         also, see HPI above. I have reviewed all other systems and they were negative.   Physical Exam  General:  alert and overweight-appearing.  nontoxic but haorse, very nice  Ears:  bilat tm's not red, sinus nontender Mouth:  pharyngeal min erythema and fair dentition.  no pnd Lungs:  normal respiratory effort, no intercostal retractions or use of accessory muscles; normal breath sounds bilaterally - no crackles and no wheezes.    Heart:  normal rate, regular rhythm, no murmur, and no rub. BLE without edema. normal DP pulses and normal cap refill in all 4 extremities    Psych:  Oriented X3, memory intact for recent and remote, normally interactive, good eye contact, not anxious appearing, not depressed appearing, and not agitated.      Impression & Recommendations:  Problem # 1:  BRONCHITIS-ACUTE (ICD-466.0)  improved but now hoarse with dry copugh - cxr 6/6 reviewed and info shared with pt - complete abx and focus and cough suppression - new erx for teassalon - ok to cont alb as needed  The following medications were removed from the medication list:    Hydrocodone-homatropine 5-1.5 Mg/29ml Syrp (Hydrocodone-homatropine) .Marland Kitchen... 1 tsp by mouth q 6 hrs as needed Her updated medication list for this problem includes:    Levaquin 500 Mg Tabs (Levofloxacin) .Marland Kitchen... 1 by mouth once daily    Proair Hfa 108 (90 Base) Mcg/act Aers (Albuterol sulfate) .Marland Kitchen... 2 puffs four times per day as needed shortness of breath    Tessalon 200 Mg Caps (Benzonatate) .Marland Kitchen... 1 by mouth three times a day x 5 days, then every 8 hours as needed for cough  Orders: Prescription Created Electronically [phone])  Problem # 2:  DIABETES MELLITUS, TYPE II (ICD-250.00)  recent exac by steroids - reassurance and education provided today no change in meds unless abn a1c - check labs now Her updated medication list for this problem includes:    Metformin Hcl 500 Mg Tabs (Metformin hcl) .Marland Kitchen... Take 1 two times a day    Altace 5 Mg Caps (Ramipril) .Marland Kitchen... 1 every day  Orders: TLB-A1C / Hgb A1C (Glycohemoglobin) (83036-A1C) TLB-Microalbumin/Creat Ratio, Urine  (82043-MALB)  Labs Reviewed: Creat: 0.8 (04/15/2009)    Reviewed HgBA1c results: 6.0 (10/15/2008)  Problem # 3:  HYPERLIPIDEMIA (ICD-272.4) check flp and lfts now - Her updated medication list for this problem includes:    Simvastatin 40 Mg Tabs (Simvastatin) ..... One by mouth daily  Orders: TLB-Lipid Panel (80061-LIPID)  Labs Reviewed: SGOT: 24 (03/12/2009)     Lipid Goals: Chol Goal: 200 (07/23/2008)   HDL Goal: 40 (07/23/2008)   LDL Goal: 70 (07/23/2008)   TG Goal: 150 (07/23/2008)  Prior 10 Yr Risk Heart Disease: N/A (07/23/2008)   HDL:94.30 (03/12/2009), 61.00 (08/14/2008)  LDL:70 (08/14/2008)  Chol:206 (03/12/2009), 145 (08/14/2008)  Trig:72.0 (03/12/2009), 70.0 (08/14/2008)  Problem # 4:  ARTHRITIS (ICD-716.90)  s/p eval by  ortho as prev advised to consider shoulder replacment (prior to CABG) last year delayed replacment at this time per pt preference improved pain s/p repeat steroid shot continue max med mgmt of pain wih combo of mobic, tylenol and tramadol as well as heat and exercise reassurance provided - ?periodic steroid injection if pt remains comitted to nonop mgmt of pain (also declines rx for narc offered today) ok to try reduced dose mobic and monitor for flare pain symptoms - new erx done  Time spent with patient 40 minutes, more than 50% of this time was spent counseling patient on recent illness and cough mgmt, chonic med conditions and need for labs and plans to reduce medications as able -  Orders: Prescription Created Electronically [phone])  Complete Medication List: 1)  Norvasc 5 Mg Tabs (Amlodipine besylate) .Marland Kitchen.. 1 every day 2)  Mobic 7.5 Mg Tabs (Meloxicam) .Marland Kitchen.. 1 by mouth once daily 3)  Simvastatin 40 Mg Tabs (Simvastatin) .... One by mouth daily 4)  Metformin Hcl 500 Mg Tabs (Metformin hcl) .... Take 1 two times a day 5)  Altace 5 Mg Caps (Ramipril) .Marland Kitchen.. 1 every day 6)  Fish Oil 1200 Mg Caps (Omega-3 fatty acids) .... Take one by mouth once  daily 7)  Dexilant 60 Mg Cpdr (Dexlansoprazole) .Marland Kitchen.. 1 by mouth once daily 8)  Multivitamins Tabs (Multiple vitamin) .... Daily 9)  Calcium Carbonate Powd (Calcium carbonate) .... 600mg  with v it d  daily 10)  Vitamin C 500 Mg Tabs (Ascorbic acid) .... Daily 11)  Vitamin B Complex-c Caps (B complex-c) .... Daily 12)  Zinc  .... Daily 13)  Selenium  .... Daily 14)  Penlac 8 % Soln (Ciclopirox) .... Apply to affected nails at bedtime 15)  Cetirizine Hcl 10 Mg Tabs (Cetirizine hcl) .... Take 1 by mouth once daily 16)  Tramadol Hcl 50 Mg Tabs (Tramadol hcl) .Marland Kitchen.. 1-2 by mouth four times a day as needed for arthritis pain 17)  Zolpidem Tartrate 5 Mg Tabs (Zolpidem tartrate) .... 1/2-1 tab by mouth at bedtime as needed for sleep 18)  Carvedilol 6.25 Mg Tabs (Carvedilol) .Marland Kitchen.. 1 tab two times a day 19)  Levaquin 500 Mg Tabs (Levofloxacin) .Marland Kitchen.. 1 by mouth once daily 20)  Proair Hfa 108 (90 Base) Mcg/act Aers (Albuterol sulfate) .... 2 puffs four times per day as needed shortness of breath 21)  Tessalon 200 Mg Caps (Benzonatate) .Marland Kitchen.. 1 by mouth three times a day x 5 days, then every 8 hours as needed for cough  Other Orders: TLB-Hepatic/Liver Function Pnl (80076-HEPATIC)  Patient Instructions: 1)  it was good to see you today.  2)  test(s) ordered today - your results will be posted on the phone tree for review in 48-72 hours from the time of test completion; call [phone] and enter your 9 digit MRN (listed above on this page, just below your name); if any changes need to be made or there are abnormal results, you will be contacted directly.  3)  reduce does of mobic as discussed and let us know if you experience any arthritis flares on lower dose treatment - 4)  add tessalon for cough - rest your voice and complete antibiotics as ongoing - the chest xray looked fine 5)  Please schedule a follow-up appointment in 4 months, sooner if problems.  Prescriptions: TESSALON 200 MG CAPS (BENZONATATE) 1  by mouth three times a day x 5 days, then every 8 hours as needed for cough  #30 x 1   Entered and Authorized by:   Newt Lukes MD   Signed by:   Newt Lukes MD on 09/30/2009   Method used:   Electronically to        [facility]       [address]       [place], [place]  [address]       Ph: [phone] or [phone]       Fax: [phone]   RxID:   [id number] MOBIC 7.5 MG TABS (MELOXICAM) 1 by mouth once daily  #30 x 6   Entered and Authorized by:   Newt Lukes MD   Signed by:   Newt Lukes MD on 09/30/2009   Method used:   Electronically to        [facility]       [address]       [place], [place]  [address]       Ph: [phone] or [phone]       Fax: [phone]   RxID:   [id number]

## 2010-05-20 NOTE — Assessment & Plan Note (Signed)
Summary: rov/palps   Visit Type:  Follow-up Primary Provider:  Newt Lukes, MD  CC:  pt was put on amlodipine by Dr Felicity Coyer.  History of Present Illness: [name] is a 77 year old with a history of CAD (s/p CABG in 05/2008), hypertension, dyslipidemia and fibromyalgia.  I saw her in clinic in November.  Since seen, she has had significant problems with her BP.  She was seen by Willey Blade.  Medical therapy was changed.  She also had a head CT that was negative for acute problems (was having headaches) The patient has also complained of palpitations, sometimes waking her from sleep.  Denies dizziness with these.  Last at most a couple minutes.Beacher May to be improving now that her BP is under better control. Notes pains in her L shoulder, arm, chest.  With and without activity.  Does have a history of DJD of this shoulder and rotator cuff problems.  Current Medications (verified): 1)  Simvastatin 40 Mg Tabs (Simvastatin) .... One By Mouth Daily 2)  Rozerem 8 Mg Tabs (Ramelteon) .... As Needed 3)  Metformin Hcl 500 Mg Tabs (Metformin Hcl) .... Take 1 Two Times A Day 4)  Ramipril 5 Mg Caps (Ramipril) .... Take One Capsule By Mouth Daily 5)  Multivitamins   Tabs (Multiple Vitamin) .... Daily 6)  Calcium Carbonate   Powd (Calcium Carbonate) .... 600mg  With V It D  Daily 7)  Vitamin C 500 Mg  Tabs (Ascorbic Acid) .... Daily 8)  Fish Oil 1200 Mg Caps (Omega-3 Fatty Acids) .... Take One By Mouth Once Daily 9)  Vitamin B Complex-C   Caps (B Complex-C) .... Daily 10)  Zinc .... Daily 11)  Selenium .... Daily 12)  Dexilant 60 Mg Cpdr (Dexlansoprazole) .Marland Kitchen.. 1 By Mouth Once Daily 13)  Tramadol Hcl 50 Mg Tabs (Tramadol Hcl) .Marland Kitchen.. 1-2 By Mouth Qid As Needed For Arthritis Pain 14)  Mobic 15 Mg Tabs (Meloxicam) .... Take 1 By Mouth Qd 15)  Penlac 8 % Soln (Ciclopirox) .... Apply To Affected Nails At Bedtime 16)  Cetirizine Hcl 10 Mg Tabs (Cetirizine Hcl) .... Take 1 By Mouth Once Daily 17)   Metoprolol Tartrate 25 Mg Tabs (Metoprolol Tartrate) .... 1/2 By Mouth Two Times A Day 18)  Amlodipine Besylate 5 Mg Tabs (Amlodipine Besylate) .Marland Kitchen.. 1 By Mouth Once Daily  Allergies (verified): No Known Drug Allergies  Past History:  Past Surgical History: Last updated: 03/12/2009 CABG Feb 2010 Inguinal Hernia Repair Hysterectomy (1981) Appendectomy Tubaligation Bilat knee replacements Left RCT Repair Herniated disc (x2) Ovarian cystectomy Cataract Extraction bilateral   Social History: Last updated: 09/18/2008 Widowed.  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 pylori gastritis treated 02/2009 ARTHRITIS gout Hemorrhoids Tubular adenoma polyp 02/2007 Fibromyalgia  Vital Signs:  Patient profile:   77 year old female Height:      59 inches Weight:      155 pounds Pulse rate:   72 / minute BP sitting:   135 / 69  (left arm) Cuff size:   regular  Vitals Entered By: Burnett Kanaris, CNA (May 09, 2009 3:58 PM)  Physical Exam  Additional Exam:  HEENT:  Normocephalic, atraumatic. EOMI, PERRLA.  Neck: JVP is normal. No thyromegaly. No bruits.  Lungs: clear to auscultation. No rales no wheezes.  Chest:  L chest tender on  palpation. Heart: Regular rate and rhythm. Normal S1, S2. No S3.   No significant murmurs. PMI not displaced.  Abdomen:  Supple, nontender. Normal bowel sounds. No masses. No hepatomegaly.  Extremities:   Good distal pulses throughout. No lower extremity edema.  Decreased range of motion and pain with motion of L shoulder. Musculoskeletal :moving all extremities.  Neuro:   alert and oriented x3.    Impression & Recommendations:  Problem # 1:  CAD, NATIVE VESSEL (ICD-414.01) s/p CABG. Patients curr symptoms are from DJD, not cardiac. Continue current  regimen.  Problem # 2:  PALPITATIONS, RECURRENT (ICD-785.1) Palpitations are improving now that bp is better.  If they get worse I will schedule her for an event monitor.  Problem # 3:  HYPERLIPIDEMIA (ICD-272.4) Continue statin. Her updated medication list for this problem includes:    Simvastatin 40 Mg Tabs (Simvastatin) ..... One by mouth daily  Problem # 4:  HYPERTENSION (ICD-401.9) Better control on added regimen.  Continue.  Patient Instructions: 1)  Continue current regimen.  F/U with orthopedics.  Come back to clinic later this spring. Prescriptions: NORVASC 5 MG TABS (AMLODIPINE BESYLATE) 1 every day  #30 x 6   Entered by:   Layne Benton, RN, BSN   Authorized by:   Sherrill Raring, MD, [facility]   Signed by:   Layne Benton, RN, BSN on 05/09/2009   Method used:   Electronically to        [facility]       [address]       [place], [place]  [address]       Ph: [phone] or [phone]       Fax: [phone]   RxID:   [id number] METOPROLOL TARTRATE 25 MG TABS (METOPROLOL TARTRATE) one half a tablet 2 times per day  #30 x 6   Entered by:   Layne Benton, RN, BSN   Authorized by:   Sherrill Raring, MD, [facility]   Signed by:   Layne Benton, RN, BSN on 05/09/2009   Method used:   Electronically to        [facility]       [address]       [place], [place]  [address]       Ph: [phone] or [phone]       Fax: [phone]   RxID:   [id number] MOBIC 15 MG TABS (MELOXICAM) 1 every day  #30 x 6   Entered by:   Layne Benton, RN, BSN   Authorized by:   Sherrill Raring, MD, [facility]   Signed by:   Layne Benton, RN, BSN on 05/09/2009   Method used:   Electronically to        [facility]       [address]       [place], [place]  [address]       Ph: [phone] or [phone]       Fax: [phone]   RxID:   [id number] ALTACE 5 MG CAPS (RAMIPRIL) 1 every day  #30 x 6   Entered by:    Layne Benton, RN, BSN   Authorized by:   Sherrill Raring, MD, [facility]   Signed by:   Layne Benton, RN, BSN on 05/09/2009   Method used:   Electronically to        [facility]       [address]  [place], [place]  [address]       Ph: [phone] or [phone]       Fax: [phone]   RxID:   [id number]

## 2010-05-20 NOTE — Progress Notes (Signed)
Summary: EGD ?'s  Phone Note Call from Patient Call back at Home Phone [phone]   Caller: Patient Call For: Dr. Russella Dar Reason for Call: Talk to Nurse Summary of Call: questions regarding EGD tomorrow Initial call taken by: Vallarie Mare,  January 24, 2009 11:52 AM  Follow-up for Phone Call        Questions regarding diet after procedure and sedation to be given.  Informed what the possibilities would be for eating after procedure and also explained the sedation adn medicines we use.  Verbally stated she understands   Follow-up by: Laverna Peace RN,  January 24, 2009 1:28 PM

## 2010-05-20 NOTE — Assessment & Plan Note (Signed)
Summary: 6-7 MONTH ROV/SL   Visit Type:  Follow-up Primary Provider:  Newt Lukes, MD  CC:  no complaints.  History of Present Illness: [name] is a 77 year old with a history of CAD (s/p CABG in 05/2008), hypertension, dyslipidemia and fibromyalgia.  I saw her in clinic in January Since I saw her last she has done well from a cardiac standpoint.  SHe denies chest pains.  No SOB.  Her biggest complaint is pain in her shoulders.  Current Medications (verified): 1)  Norvasc 5 Mg Tabs (Amlodipine Besylate) .Marland Kitchen.. 1 Every Day 2)  Mobic 7.5 Mg Tabs (Meloxicam) .Marland Kitchen.. 1 By Mouth Once Daily 3)  Simvastatin 40 Mg Tabs (Simvastatin) .... One By Mouth Daily 4)  Metformin Hcl 500 Mg Tabs (Metformin Hcl) .... Take 1 Two Times A Day 5)  Altace 5 Mg Caps (Ramipril) .Marland Kitchen.. 1 Every Day 6)  Fish Oil 1200 Mg Caps (Omega-3 Fatty Acids) .... Take One By Mouth Once Daily 7)  Dexilant 60 Mg Cpdr (Dexlansoprazole) .Marland Kitchen.. 1 By Mouth Once Daily 8)  Multivitamins   Tabs (Multiple Vitamin) .... Daily 9)  Calcium Carbonate   Powd (Calcium Carbonate) .... 600mg  With V It D  Daily 10)  Vitamin C 500 Mg  Tabs (Ascorbic Acid) .... Daily 11)  Vitamin B Complex-C   Caps (B Complex-C) .... Daily 12)  Zinc .... Daily 13)  Penlac 8 % Soln (Ciclopirox) .... Apply To Affected Nails At Bedtime 14)  Cetirizine Hcl 10 Mg Tabs (Cetirizine Hcl) .... Take 1 By Mouth Once Daily 15)  Tramadol Hcl 50 Mg Tabs (Tramadol Hcl) .Marland Kitchen.. 1-2 By Mouth Four Times A Day As Needed For Arthritis Pain 16)  Zolpidem Tartrate 5 Mg Tabs (Zolpidem Tartrate) .... 1/2-1 Tab By Mouth At Bedtime As Needed For Sleep 17)  Carvedilol 6.25 Mg Tabs (Carvedilol) .Marland Kitchen.. 1 Tab Two Times A Day 18)  Prednisone (Pak) 10 Mg Tabs (Prednisone) .... As Directed X 6 Days 19)  Robaxin 500 Mg Tabs (Methocarbamol) .Marland Kitchen.. 1 By Mouth Every 8 Hours As Needed For Muscle Spasm  Allergies (verified): No Known Drug Allergies  Past History:  Past medical, surgical, family and  social histories (including risk factors) reviewed, and no changes noted (except as noted below).  Past Medical History: Reviewed history from 11/28/2009 and no changes required. CORONARY ARTERY DISEASE DYSLIPIDEMIA DIABETES type 2 HYPERTENSION DIVERTICULOSIS, COLON GERD w/ HH H. pylori gastritis treated 02/2009 ARTHRITIS -gout +OA, severe polyarthritis with chronic inflammatory changes Hemorrhoids Tubular adenoma polyp 02/2007   MD roster: [facility] - [facility] (knees), chandler (shoulder)  Past Surgical History: Reviewed history from 09/30/2009 and no changes required. CABG Feb 2010 Inguinal Hernia Repair Hysterectomy (1981) Appendectomy Tubaligation Bilat knee replacements Left RCT Repair Herniated disc (x2)  Ovarian cystectomy Cataract Extraction bilateral   Family History: Reviewed history from 03/12/2009 and no changes required. Mother died of stomach cancer father died of MI age 60 1 of 10 children.  one sister died in 31s of CAD, DM.  One brother died of MI age 76 No FH of Colon Cancer:  Social History: Reviewed history from 09/18/2008 and no changes required. Widowed.  Lives alone 2 sons, 2 daughters Retired Health visitor. No tobacco No ETOH Former Smoker  Vital Signs:  Patient profile:   77 year old female Height:      60 inches Weight:      162 pounds Pulse rate:   70 / minute BP sitting:   121 /  68  (left arm) Cuff size:   regular  Vitals Entered By: Burnett Kanaris, CNA (November 29, 2009 3:24 PM)  Physical Exam  Additional Exam:  Patient is in NAD HEENT:  Normocephalic, atraumatic. EOMI, PERRLA.  Neck: JVP is normal. No thyromegaly. .  Lungs: clear to auscultation. No rales no wheezes.  Heart: Regular rate and rhythm. Normal S1, S2. No S3.   No significant murmurs. PMI not displaced.  Abdomen:  Supple, nontender. Normal bowel sounds. No masses  Extremities:   Good distal pulses throughout. No lower extremity edema.  Musculoskeletal  :moving all extremities.  Neuro:   alert and oriented x3.    EKG  Procedure date:  11/29/2009  Findings:      NSR>  72 bpm.  Occasional PACs.    Impression & Recommendations:  Problem # 1:  CAD, NATIVE VESSEL (ICD-414.01) Doing well.  No signss of active ischemia.  Problem # 2:  HYPERTENSION (ICD-401.9) Adequate control.  Problem # 3:  HYPERLIPIDEMIA (ICD-272.4) Will need to check on dosing.  Keep on same regimen for now. Her updated medication list for this problem includes:    Simvastatin 40 Mg Tabs (Simvastatin) ..... One by mouth daily  Other Orders: EKG w/ Interpretation (93000)  Appended Document: 6-7 MONTH ROV/SL Need to decrease Simvistatin to 20 mg since on Norvasc Check lipid panel and AST in 8 wks.  Appended Document: 6-7 MONTH ROV/SL Called patient...she is aware of above...will come back for labs at [address] mid October.

## 2010-05-20 NOTE — Procedures (Signed)
Summary: Colonoscopy    Colonoscopy  Procedure date:  08/20/2000  Findings:      Results: Hemorrhoids.     Results: Diverticulosis.       Location:  [facility].    Procedures Next Due Date:    Colonoscopy: 08/2010  Colonoscopy  Procedure date:  08/20/2000  Findings:      Results: Hemorrhoids.     Results: Diverticulosis.       Location:   [facility].    Procedures Next Due Date:    Colonoscopy: 08/2010 Patient Name: Susan, Flynn MRN:  Procedure Procedures: Colonoscopy CPT: [address].  Personnel: Endoscopist: Venita Lick. Russella Dar, MD, [facility].  Referred By: Judie Petit. Nicanor Bake, MD.  Exam Location: Exam performed in Outpatient Clinic. Outpatient  Patient Consent: Procedure, Alternatives, Risks and Benefits discussed, consent obtained, from patient.  Indications  Average Risk Screening Routine.  History  Pre-Exam Physical: Performed Aug 20, 2000. Cardio-pulmonary exam, Rectal exam, HEENT exam , Abdominal exam, Extremity exam, Neurological exam, Mental status exam WNL.  Exam Exam: Extent of exam reached: Cecum, extent intended: Cecum.  The cecum was identified by appendiceal orifice and IC valve. Colon retroflexion performed. ASA Classification: II. Tolerance: excellent.  Monitoring: Pulse and BP monitoring, Oximetry used. Supplemental O2 given.  Colon Prep Used Golytely for colon prep. Prep results: good.  Sedation Meds: Fentanyl 75 mcg. Versed 7 mg.  Findings NORMAL EXAM: Cecum to Splenic Flexure.  - DIVERTICULOSIS: Descending Colon to Sigmoid Colon. Not bleeding. ICD9: Diverticulosis: 562.10.  HEMORRHOIDS: Internal. Size: Small. Not bleeding. Not thrombosed. ICD9: Hemorrhoids, Internal: 455.0.   Assessment Abnormal examination, see findings above.  Diagnoses: 562.10: Diverticulosis.  455.0: Hemorrhoids, Internal.   Events  Unplanned Interventions: No intervention was required.  Unplanned Events: There were no complications.  Plans Medication Plan: Continue current medications.  Patient Education: Patient given standard instructions for: Diverticulosis.  Disposition: After procedure patient sent to recovery. After recovery patient sent home.  Scheduling/Referral: EGD, to [employer]. Russella Dar, MD, [facility], ASAP  Colonoscopy, to [employer] T. Russella Dar, MD, [facility], around Aug 21, 2010.  Referring provider, to M. Nicanor Bake, MD, Aug 21, 2000.    This report was created from the original endoscopy report, which was reviewed and signed by the above listed endoscopist.    cc: M. Nicanor Bake, MD

## 2010-05-20 NOTE — Letter (Signed)
Summary: [employer]  [employer]   Imported By: Sherian Rein 02/24/2010 09:22:52  _____________________________________________________________________  External Attachment:    Type:   Image     Comment:   External Document

## 2010-05-20 NOTE — Letter (Signed)
Summary: [facility]  [facility]   Imported By: Roderic Ovens 03/19/2009 10:46:08  _____________________________________________________________________  External Attachment:    Type:   Image     Comment:   External Document

## 2010-05-20 NOTE — Progress Notes (Signed)
Summary: cp  Phone Note Call from Patient Call back at Home Phone [phone]   Caller: Patient Reason for Call: Talk to Nurse Complaint: Chest Pain Summary of Call: Start having pain left side of chest on sat.  pain come and goes/ t. today pain was a littler longer.  no sob/or arm numbness. pain feels like a insectt bite.  Initial call taken by: Omar Person,  Sep 03, 2008 2:10 PM  Follow-up for Phone Call        Called pt. ...she states she has had a few episodes that feel like she has had an insect bite in her chest, not associated with any other symptoms . She states that her pain prior  to CABG surgery was like severe heart burn. She will call back if she has any other symptoms...she is pain free currently. Will let Dr. Tenny Craw know that she called.       Appended Document: cp CP sounds atypical Move f/u to mid to late June.  If CP continues to get worse, patient should call back  Appended Document: cp Called pt. back...changed august apt. to 06/18.

## 2010-05-20 NOTE — Letter (Signed)
Summary: Generic Letter  [facility]  [address] [place], [place] [address]   Phone: [phone]  Fax: [phone]    10/16/2009  [facility] [address] [place], [place]  [address]  To whom it may concern:  The above named patient has been under my care and is now allowed to return to aerobics class.           Sincerely,  Rene Paci, MD

## 2010-05-20 NOTE — Progress Notes (Signed)
Summary: BP Medication  Phone Note Outgoing Call   Summary of Call: BP meds  Initial call taken by: J Cleven Jansma RN  Follow-up for Phone Call        We recieved a request for a refill for Carvedilol  which is not on her medication list. Called patient and she states that she has been taking Carvedilol 6.25mg  2 times per day for over 1 year. She is also taking Metoprolol 12.5 mg two times a day. Advised will discuss medication confusion with Dr.Ross and call her back. Follow-up by: Suzan Garibaldi RN  Additional Follow-up for Phone Call Additional follow up Details #1::        Discussed above with Dr. Tenny Craw...she advised that patient stay on Carvedilol and DC Metoprolol. Called patient back and she is aware of this. Will send in refill for Carvedilol. Additional Follow-up by: J Clela Hagadorn RN    New/Updated Medications: CARVEDILOL 6.25 MG TABS (CARVEDILOL) 1 tab two times a day Prescriptions: CARVEDILOL 6.25 MG TABS (CARVEDILOL) 1 tab two times a day  #60 x 11   Entered by:   Layne Benton, RN, BSN   Authorized by:   Sherrill Raring, MD, [facility]   Signed by:   Layne Benton, RN, BSN on 08/19/2009   Method used:   Electronically to        [facility] (retail)       [address]       [place], [place]  [address]       Ph: [phone] or [phone]       Fax: [phone]   RxID:   [id number]

## 2010-05-20 NOTE — Assessment & Plan Note (Signed)
Summary: NAIL AND SKIN PROBLEM/NWS   Vital Signs:  Patient profile:   77 year old female Height:      58 inches (147.32 cm) Weight:      150.12 pounds (68.24 kg) O2 Sat:      97 % Temp:     98.6 degrees F (37.00 degrees C) oral Pulse rate:   66 / minute BP sitting:   138 / 72  (left arm) Cuff size:   regular  Vitals Entered By: Orlan Leavens (February 20, 2009 1:41 PM) CC: Nail & skin problem/ Need new rx for mobic Is Patient Diabetic? Yes Did you bring your meter with you today? No Pain Assessment Patient in pain? no        Primary Care Provider:  Newt Lukes, MD  CC:  Nail & skin problem/ Need new rx for mobic.  History of Present Illness: here today with complaint of nail problems onset of symptoms was several months ago. course has been gradual  onset and now occurs constantly. problem precipitated by artificial nails earlier in the year - removed in the spring problem associated with cosmetic concern but not associated with bleeding or pain or nail deformity. symptoms improved by nothing - has had regular "cleaning manicures" but seems to be getting worse. no prior hx of same symptoms.  affects all finger nails but not affecting toenails  also c/o dry skin and "ashy" changes on legs - unrelieved with baby oil after shower  Current Medications (verified): 1)  Simvastatin 40 Mg Tabs (Simvastatin) .... One By Mouth Daily 2)  Certerizine 10mg  .... One By Mouth Daily 3)  Carvedilol 6.25 Mg Tabs (Carvedilol) .... One By Mouth Two Times A Day 4)  Rozerem 8 Mg Tabs (Ramelteon) .... As Needed 5)  Metformin Hcl 500 Mg Tabs (Metformin Hcl) .... Take 1 Two Times A Day 6)  Ramipril 5 Mg Caps (Ramipril) .... Take One Capsule By Mouth Daily 7)  Multivitamins   Tabs (Multiple Vitamin) .... Daily 8)  Calcium Carbonate   Powd (Calcium Carbonate) .... 600mg  With V It D  Daily 9)  Vitamin C 500 Mg  Tabs (Ascorbic Acid) .... Daily 10)  Fish Oil   Oil (Fish Oil) .... 1200mg   Daily 11)  Vitamin B Complex-C   Caps (B Complex-C) .... Daily 12)  Zinc .... Daily 13)  Selenium .... Daily 14)  Dexilant 60 Mg Cpdr (Dexlansoprazole) .Marland Kitchen.. 1 By Mouth Once Daily 15)  Tramadol Hcl 50 Mg Tabs (Tramadol Hcl) .Marland Kitchen.. 1-2 By Mouth Qid As Needed For Arthritis Pain 16)  Mobic 15 Mg Tabs (Meloxicam) .... Take 1 By Mouth Qd 17)  Pylera 140-125-125 Mg Caps (Bis Subcit-Metronid-Tetracyc) .... 3 By Mouth Qid  Allergies (verified): No Known Drug Allergies  Past History:  Past Medical History: Reviewed history from 01/17/2009 and no changes required. CORONARY ARTERY DISEASE DYSLIPIDEMIA DIABETES type 2 HYPERTENSION DIVERTICULOSIS, COLON (ICD-562.10) GERD w/ HH ARTHRITIS gout Hemorrhoids Tubular adenoma polyp 02/2007  Review of Systems  The patient denies suspicious skin lesions, difficulty walking, and abnormal bleeding.    Physical Exam  General:  alert, well-developed, well-nourished, and cooperative to examination.    Lungs:  normal respiratory effort, no intercostal retractions or use of accessory muscles; normal breath sounds bilaterally - no crackles and no wheezes.    Heart:  normal rate, regular rhythm, no murmur, and no rub. BLE without edema. Skin:  onchymycosis of all fingernails not affecting base -  dry flaking skin BLE but  not UE - no inflammation or erythema Psych:  Oriented X3, memory intact for recent and remote, normally interactive, good eye contact, not anxious appearing, not depressed appearing, and not agitated.      Impression & Recommendations:  Problem # 1:  DERMATOPHYTOSIS OF NAIL (ICD-110.1)  educated on dx - tx topical no manicures until improved Her updated medication list for this problem includes:    Penlac 8 % Soln (Ciclopirox) .Marland Kitchen... Apply to affected nails at bedtime  Orders: Prescription Created Electronically [phone])  Problem # 2:  DRY SKIN (ICD-701.1) advised on mosturizers as likely to worsen with season change to cold   Complete Medication List: 1)  Simvastatin 40 Mg Tabs (Simvastatin) .... One by mouth daily 2)  Certerizine 10mg   .... One by mouth daily 3)  Carvedilol 6.25 Mg Tabs (Carvedilol) .... One by mouth two times a day 4)  Rozerem 8 Mg Tabs (Ramelteon) .... As needed 5)  Metformin Hcl 500 Mg Tabs (Metformin hcl) .... Take 1 two times a day 6)  Ramipril 5 Mg Caps (Ramipril) .... Take one capsule by mouth daily 7)  Multivitamins Tabs (Multiple vitamin) .... Daily 8)  Calcium Carbonate Powd (Calcium carbonate) .... 600mg  with v it d  daily 9)  Vitamin C 500 Mg Tabs (Ascorbic acid) .... Daily 10)  Fish Oil Oil (Fish oil) .... 1200mg  daily 11)  Vitamin B Complex-c Caps (B complex-c) .... Daily 12)  Zinc  .... Daily 13)  Selenium  .... Daily 14)  Dexilant 60 Mg Cpdr (Dexlansoprazole) .Marland Kitchen.. 1 by mouth once daily 15)  Tramadol Hcl 50 Mg Tabs (Tramadol hcl) .Marland Kitchen.. 1-2 by mouth qid as needed for arthritis pain 16)  Mobic 15 Mg Tabs (Meloxicam) .... Take 1 by mouth qd 17)  Pylera 140-125-125 Mg Caps (Bis subcit-metronid-tetracyc) .... 3 by mouth qid 18)  Penlac 8 % Soln (Ciclopirox) .... Apply to affected nails at bedtime  Patient Instructions: 1)  use the PenLac to your nails - apply at bedtime - do not wash hands for at least 8 hours after application - then clean nails every 7 days with alcohol. do not get manicures or place other nail polish on until after the affected nails have grown out - ok to keep your nails clipped and filed in the meanwhile 2)  your prescription has been electronically submitted to your pharmacy. Please take as directed. Contact our office if you believe you're having problems with the medication.  3)  for your dry skin, use moisturizing lotion on top of baby oil to keep skin from drying after bathing Prescriptions: PENLAC 8 % SOLN (CICLOPIROX) apply to affected nails at bedtime  #1 x 1   Entered and Authorized by:   Newt Lukes MD   Signed by:   Newt Lukes MD on  02/20/2009   Method used:   Electronically to        [facility]       [address]       [place], [place]  [address]       Ph: [phone] or [phone]       Fax: [phone]   RxID:   [id number] MOBIC 15 MG TABS (MELOXICAM) take 1 by mouth qd  #30 x 2   Entered by:   Orlan Leavens   Authorized by:   Newt Lukes MD   Signed by:   Orlan Leavens on 02/20/2009   Method used:   Electronically to  [facility]       [address]       [place], [place]  [address]       Ph: [phone] or [phone]       Fax: [phone]   RxID:   [id number]

## 2010-05-20 NOTE — Letter (Signed)
Summary: Results Follow-up Letter  [facility]  [address] [place], [place] [address]   Phone: [phone]  Fax: [phone]    10/15/2008  [address] [place], [place]  [address]  Dear Ms. Portocarrero,   The following are the results of your recent test(s)10/15/08  Test          Result     CBCD                  Hemoglobin improved since last results Vitamin B12/Folate    Normal IRON                  Normal A1C                   Normal  Will postpone gastroenterology work-up at this time, but will recheck CBC at your next visit. No changes in diabetes management. Attached a copy of your bloodwork for your records.     Sincerely,  Dr. Rene Paci [facility]

## 2010-05-20 NOTE — Progress Notes (Signed)
Summary: Rx req  Phone Note Call from Patient Call back at Home Phone [phone]   Caller: Patient Summary of Call: Pt called requesting all of her medication be transferred to mail order pharmacy per [facility]. Per pt [facility] is the mail order pharmacy. Rx sent. pt informed Initial call taken by: Margaret Pyle, CMA,  October 15, 2009 11:19 AM    Prescriptions: PROAIR HFA 108 (90 BASE) MCG/ACT AERS (ALBUTEROL SULFATE) 2 puffs four times per day as needed shortness of breath  #3 x 1   Entered by:   Margaret Pyle, CMA   Authorized by:   Newt Lukes MD   Signed by:   Margaret Pyle, CMA on 10/15/2009   Method used:   Faxed to ...       [facility])             , [place]         Ph: [phone]       Fax: [phone]   RxID:   [id number] CARVEDILOL 6.25 MG TABS (CARVEDILOL) 1 tab two times a day  #180 x 1   Entered by:   Margaret Pyle, CMA   Authorized by:   Newt Lukes MD   Signed by:   Margaret Pyle, CMA on 10/15/2009   Method used:   Faxed to ...       [facility])             , [place]         Ph: [phone]       Fax: [phone]   RxID:   [id number] DEXILANT 60 MG CPDR (DEXLANSOPRAZOLE) 1 by mouth once daily  #90 x 1   Entered by:   Margaret Pyle, CMA   Authorized by:   Newt Lukes MD   Signed by:   Margaret Pyle, CMA on 10/15/2009   Method used:   Faxed to ...       [facility])             , [place]         Ph: [phone]       Fax: [phone]   RxID:   [id number] ALTACE 5 MG CAPS (RAMIPRIL) 1 every day  #90 x 1   Entered by:   Margaret Pyle, CMA   Authorized by:   Newt Lukes MD   Signed by:   Margaret Pyle, CMA on 10/15/2009   Method used:   Faxed to ...       [facility])             , [place]         Ph: [phone]       Fax: [phone]   RxID:    [id number] METFORMIN HCL 500 MG TABS (METFORMIN HCL) take 1 two times a day  #180 x 1   Entered by:   Margaret Pyle, CMA   Authorized by:   Newt Lukes MD   Signed by:   Margaret Pyle, CMA on 10/15/2009   Method used:   Faxed to ...       [facility])             , [place]         Ph: [phone]       Fax: [phone]   RxID:   [id number] SIMVASTATIN 40 MG TABS (SIMVASTATIN) one by mouth daily  #90 x 1   Entered by:  Margaret Pyle, CMA   Authorized by:   Newt Lukes MD   Signed by:   Margaret Pyle, CMA on 10/15/2009   Method used:   Faxed to ...       [facility])             , [place]         Ph: [phone]       Fax: [phone]   RxID:   [id number] MOBIC 7.5 MG TABS (MELOXICAM) 1 by mouth once daily  #90 x 1   Entered by:   Margaret Pyle, CMA   Authorized by:   Newt Lukes MD   Signed by:   Margaret Pyle, CMA on 10/15/2009   Method used:   Faxed to ...       [facility])             , [place]         Ph: [phone]       Fax: [phone]   RxID:   [id number] NORVASC 5 MG TABS (AMLODIPINE BESYLATE) 1 every day  #90 x 1   Entered by:   Margaret Pyle, CMA   Authorized by:   Newt Lukes MD   Signed by:   Margaret Pyle, CMA on 10/15/2009   Method used:   Faxed to ...       [facility])             , [place]         Ph: [phone]       Fax: [phone]   RxID:   [id number]

## 2010-05-20 NOTE — Procedures (Signed)
Summary: Upper Endoscopy  Patient: [name] Note: All result statuses are Final unless otherwise noted.  Tests: (1) Upper Endoscopy (EGD)   EGD Upper Endoscopy       DONE (C)     [facility]     [address]     [place], [place]  [address]           ENDOSCOPY PROCEDURE REPORT           PATIENT:  [name], [name]  MR#:  [id number]     BIRTHDATE:  [birth year], 78 yrs. old  GENDER:  female           ENDOSCOPIST:  Judie Petit T. Russella Dar, MD, [facility]           PROCEDURE DATE:  01/25/2009     PROCEDURE:  EGD with biopsy     ASA CLASS:  Class II     INDICATIONS:  FOBT + stool, anemia           MEDICATIONS:  Fentanyl 25 mcg IV, Versed 5 mg IV     TOPICAL ANESTHETIC:  Exactacain Spray           DESCRIPTION OF PROCEDURE:   After the risks benefits and     alternatives of the procedure were thoroughly explained, informed     consent was obtained.  The LB [id number] endoscope was     introduced through the mouth and advanced to the second portion of     the duodenum, without limitations.  The instrument was slowly     withdrawn as the mucosa was fully examined.     <<PROCEDUREIMAGES>>           Moderate gastritis was found in the total stomach. It was granular     and erythematous. Multiple biopsies were obtained and sent to     pathology.  Otherwise the examination was normal.   Retroflexed     views revealed a hiatal hernia, small.  The scope was then     withdrawn from the patient and the procedure completed.           COMPLICATIONS:  None           ENDOSCOPIC IMPRESSION:     1) Moderate, diffuse gastritis     2) Small hiatal hernia           RECOMMENDATIONS:     1) await pathology results     2) continue PPI qam long term if NSAIDs needed     3) suspect bleeding from an NSAID ulcer that has healed or     gastritis           Benny Deutschman T. Russella Dar, MD, [facility]           CC:  Etta Grandchild, MD           n.     REVISED:  01/30/2009 09:19 AM     eSIGNED:   Judie Petit T.  Abner Ardis at 01/30/2009 09:19 AM           Ward Chatters, [id number]  Note: An exclamation mark (!) indicates a result that was not dispersed into the flowsheet. Document Creation Date: 01/30/2009 9:19 AM _______________________________________________________________________  (1) Order result status: Final Collection or observation date-time: 01/25/2009 09:48 Requested date-time:  Receipt date-time:  Reported date-time:  Referring Physician:   Ordering Physician: Claudette Head [phone]) Specimen Source:  Source: Launa Grill Order Number: [id number] Lab site:

## 2010-05-20 NOTE — Assessment & Plan Note (Signed)
Summary: chronic arthritis hands/feet/cd   Vital Signs:  Patient profile:   77 year old female Height:      60 inches (152.40 cm) Weight:      169.0 pounds (76.82 kg) O2 Sat:      96 % on Room air Temp:     98.4 degrees F (36.89 degrees C) oral Pulse rate:   81 / minute BP sitting:   120 / 60  (left arm) Cuff size:   regular  Vitals Entered By: Orlan Leavens RMA (March 25, 2010 9:12 AM)  O2 Flow:  Room air CC: chronic arthritis Is Patient Diabetic? Yes Did you bring your meter with you today? No Pain Assessment Patient in pain? yes     Location: shoulders Type: aching Comments Pt req refills on hydrocodone & ambien   Primary Care Provider:  Newt Lukes, MD  CC:  chronic arthritis.  History of Present Illness:   also reviewed priorOV: 1) HTN - reports compliance with ongoing medical treatment and no changes in medication dose or frequency. denies adverse side effects related to current therapy.   2) dyslipidemia - reports compliance with ongoing medical treatment and no changes in medication dose or frequency. denies adverse side effects related to current therapy.   3) DM2 - reports compliance with ongoing medical treatment and no changes in medication dose or frequency. denies adverse side effects related to current therapy. no hypoglycemia but high readings after steroids use  4) diffuse, severe arthritis both inflammatory and DJD, dx never clarified in past - prev followed with deveshwar but not >71yrs - denies any dx or tx of RA or lupus or gout - participating in water areobics - compliant with mobic dose as rx'd - but still with constant pain and mild swelling - hands, wrists, knees and neck - pain also complicated by DJD with cont severe pain in both shoulders. trying water aerobics to help with pain but not (as wishes to not undergo total shoulder replacment as rec by ortho) tried to reduce dose meloxicam  but must take "full strength" - uses hydrocodone  sparing and would like to get 2nd opinion form new rheum  Clinical Review Panels:  Diabetes Management   HgBA1C:  6.2 (09/30/2009)   Creatinine:  0.8 (04/15/2009)   Last Flu Vaccine:  Fluvax 3+ (01/07/2010)  CBC   WBC:  5.8 (04/15/2009)   RBC:  4.09 (04/15/2009)   Hgb:  12.8 (04/15/2009)   Hct:  38.4 (04/15/2009)   Platelets:  174.0 (04/15/2009)   MCV  93.9 (04/15/2009)   MCHC  33.2 (04/15/2009)   RDW  14.5 (04/15/2009)   PMN:  49.0 (04/15/2009)   Lymphs:  44.3 (04/15/2009)   Monos:  5.1 (04/15/2009)   Eosinophils:  1.3 (04/15/2009)   Basophil:  0.3 (04/15/2009)  Complete Metabolic Panel   Glucose:  78 (04/15/2009)   Sodium:  139 (04/15/2009)   Potassium:  4.6 (04/15/2009)   Chloride:  104 (04/15/2009)   CO2:  27 (04/15/2009)   BUN:  14 (04/15/2009)   Creatinine:  0.8 (04/15/2009)   Albumin:  3.6 (09/30/2009)   Total Protein:  6.1 (09/30/2009)   Calcium:  9.5 (04/15/2009)   Total Bili:  0.5 (09/30/2009)   Alk Phos:  49 (09/30/2009)   SGPT (ALT):  21 (09/30/2009)   SGOT (AST):  19 (09/30/2009)   Current Medications (verified): 1)  Norvasc 5 Mg Tabs (Amlodipine Besylate) .Marland Kitchen.. 1 Every Day 2)  Meloxicam 15 Mg Tabs (Meloxicam) .Marland KitchenMarland KitchenMarland Kitchen 1  By Mouth Once Daily 3)  Metformin Hcl 500 Mg Tabs (Metformin Hcl) .... Take 1 Two Times A Day 4)  Altace 5 Mg Caps (Ramipril) .Marland Kitchen.. 1 Every Day 5)  Pure Omega .... Take Two By Mouth Once Daily 6)  Dexilant 60 Mg Cpdr (Dexlansoprazole) .Marland Kitchen.. 1 By Mouth Once Daily 7)  Multivitamins   Tabs (Multiple Vitamin) .... Daily 8)  Calcium Carbonate   Powd (Calcium Carbonate) .... 600mg  With V It D  Daily 9)  Vitamin C 500 Mg  Tabs (Ascorbic Acid) .... Daily 10)  Vitamin B Complex-C   Caps (B Complex-C) .... Daily 11)  Zinc .... Daily 12)  Penlac 8 % Soln (Ciclopirox) .... Apply To Affected Nails At Bedtime 13)  Cetirizine Hcl 10 Mg Tabs (Cetirizine Hcl) .... Take 1 By Mouth Once Daily 14)  Tramadol Hcl 50 Mg Tabs (Tramadol Hcl) .Marland Kitchen.. 1-2 By Mouth Four  Times A Day As Needed For Arthritis Pain 15)  Zolpidem Tartrate 5 Mg Tabs (Zolpidem Tartrate) .... 1/2-1 Tab By Mouth At Bedtime As Needed For Sleep 16)  Carvedilol 6.25 Mg Tabs (Carvedilol) .Marland Kitchen.. 1 Tab Two Times A Day 17)  Robaxin 500 Mg Tabs (Methocarbamol) .Marland Kitchen.. 1 By Mouth Every 8 Hours As Needed For Muscle Spasm 18)  Hydrocodone-Acetaminophen 5-500 Mg Tabs (Hydrocodone-Acetaminophen) .... 1/2-1 By Mouth Every 4 Hours As Needed For Severe Pain Symptoms 19)  Lipitor 40 Mg Tabs (Atorvastatin Calcium) .Marland Kitchen.. 1 Tab At Bedtime  Fill With Generic 20)  Voltaren 1 % Gel (Diclofenac Sodium) .... Apply To Affecetd Joint Four Times A Day As Needed For Pain Symptoms  Allergies (verified): No Known Drug Allergies  Past History:  Past medical, surgical, family and social histories (including risk factors) reviewed, and no changes noted (except as noted below).  Past Medical History: CORONARY ARTERY DISEASE DYSLIPIDEMIA DIABETES type 2 HYPERTENSION DIVERTICULOSIS, COLON GERD w/ HH H. pylori gastritis treated 02/2009 ARTHRITIS -gout +OA, severe polyarthritis with chronic inflammatory changes Hemorrhoids Tubular adenoma polyp 02/2007     MD roster: card- [facility] - [facility] (knees), [facility]ler (shoulder) GI - [facility] rhe[facility] -  Past Surgical History: Reviewed history from 09/30/2009 and no changes required. CABG Feb 2010 Inguinal Hernia Repair Hysterectomy (1981) Appendectomy Tubaligation Bilat knee replacements Left RCT Repair Herniated disc (x2)  Ovarian cystectomy Cataract Extraction bilateral   Family History: Reviewed history from 03/12/2009 and no changes required. Mother died of stomach cancer father died of MI age 30 1 of 10 children.  one sister died in 2s of CAD, DM.  One brother died of MI age 60 No FH of Colon Cancer:  Social History: Reviewed history from 09/18/2008 and no changes required. Widowed.  Lives alone 2 sons, 2 daughters Retired Health visitor. No tobacco No  ETOH Former Smoker  Review of Systems  The patient denies fever, weight loss, chest pain, syncope, and abdominal pain.    Physical Exam  General:  alert and overweight-appearing. nontoxic, very nice  Lungs:  normal respiratory effort, no intercostal retractions or use of accessory muscles; normal breath sounds bilaterally - no crackles and no wheezes.    Heart:  normal rate, regular rhythm, no murmur, and no rub. BLE without edema. Msk:  left and right shoulder with +impingment signs and decreased ROM with lateral abduction (pain if mvmt beyond 80 degrees away from body) - mild pain to palp of shoulder over right biceps groove > left side - also mild synovitis B MCPs R>L at 2/3, no deformity - +myofacial pain  trapezium (B posterior neck)   Impression & Recommendations:  Problem # 1:  ARTHRITIS (ICD-716.90)  Orders: Rheumatology Referral (Rheumatology)  s/p eval by ortho and advised to consider shoulder replacment in 2010 (prior to CABG) pt does not want surg (end stage DJD) but not improved pain s/p repeat steroid shots continue max med mgmt of pain wih combo of mobic, hydorocodoneand tramadol as well as heat and exercise also concern for inflammatory process like RA given migratory swelling of hands (MCP), wrists, knee and neck -  seek new rheum eval - remotely seen by deveshwar and does not wish to return there  Problem # 2:  DIABETES MELLITUS, TYPE II (ICD-250.00)  does not check cbgs regularly - weight gain noted and multiple steroid oinjections for shpoulders in past 6 months check a1c now and adjust metformin if/as needed  Her updated medication list for this problem includes:    Metformin Hcl 500 Mg Tabs (Metformin hcl) .Marland Kitchen... Take 1 two times a day    Altace 5 Mg Caps (Ramipril) .Marland Kitchen... 1 every day  Orders: TLB-A1C / Hgb A1C (Glycohemoglobin) (83036-A1C)  Labs Reviewed: Creat: 0.8 (04/15/2009)    Reviewed HgBA1c results: 6.2 (09/30/2009)  6.0 (10/15/2008)  Complete  Medication List: 1)  Norvasc 5 Mg Tabs (Amlodipine besylate) .Marland Kitchen.. 1 every day 2)  Meloxicam 15 Mg Tabs (Meloxicam) .Marland Kitchen.. 1 by mouth once daily 3)  Metformin Hcl 500 Mg Tabs (Metformin hcl) .... Take 1 two times a day 4)  Altace 5 Mg Caps (Ramipril) .Marland Kitchen.. 1 every day 5)  Pure Omega  .... Take two by mouth once daily 6)  Dexilant 60 Mg Cpdr (Dexlansoprazole) .Marland Kitchen.. 1 by mouth once daily 7)  Multivitamins Tabs (Multiple vitamin) .... Daily 8)  Calcium Carbonate Powd (Calcium carbonate) .... 600mg  with v it d  daily 9)  Vitamin C 500 Mg Tabs (Ascorbic acid) .... Daily 10)  Vitamin B Complex-c Caps (B complex-c) .... Daily 11)  Zinc  .... Daily 12)  Penlac 8 % Soln (Ciclopirox) .... Apply to affected nails at bedtime 13)  Cetirizine Hcl 10 Mg Tabs (Cetirizine hcl) .... Take 1 by mouth once daily 14)  Tramadol Hcl 50 Mg Tabs (Tramadol hcl) .Marland Kitchen.. 1-2 by mouth four times a day as needed for arthritis pain 15)  Zolpidem Tartrate 5 Mg Tabs (Zolpidem tartrate) .... 1/2-1 tab by mouth at bedtime as needed for sleep 16)  Carvedilol 6.25 Mg Tabs (Carvedilol) .Marland Kitchen.. 1 tab two times a day 17)  Robaxin 500 Mg Tabs (Methocarbamol) .Marland Kitchen.. 1 by mouth every 8 hours as needed for muscle spasm 18)  Hydrocodone-acetaminophen 5-500 Mg Tabs (Hydrocodone-acetaminophen) .... 1/2-1 by mouth every 4 hours as needed for severe pain symptoms 19)  Lipitor 40 Mg Tabs (Atorvastatin calcium) .Marland Kitchen.. 1 tab at bedtime  fill with generic 20)  Voltaren 1 % Gel (Diclofenac sodium) .... Apply to affecetd joint four times a day as needed for pain symptoms  Patient Instructions: 1)  it was good to see you today. 2)  test(s) ordered today - your results will be posted on the phone tree for review in 48-72 hours from the time of test completion; call [phone] and enter your 9 digit MRN (listed above on this page, just below your name); if any changes need to be made or there are abnormal results, you will be contacted directly.  3)  refills on  medications as discussed today - cont hydrocodone for pain and robaxin for muscle spasm as needed  4)  we'll make referral to rheumatologist dr. Dareen Piano for your arthritis pain and new opinion. Our office will contact you regarding this appointment once made.  5)  Please schedule a follow-up appointment in 3-4 months, call sooner if problems.  Prescriptions: ROBAXIN 500 MG TABS (METHOCARBAMOL) 1 by mouth every 8 hours as needed for muscle spasm  #60 x 1   Entered and Authorized by:   Newt Lukes MD   Signed by:   Newt Lukes MD on 03/25/2010   Method used:   Printed then faxed to ...       [facility] (mail-order)             , [place]         Ph: [phone]       Fax: [phone]   RxID:   [id number] HYDROCODONE-ACETAMINOPHEN 5-500 MG TABS (HYDROCODONE-ACETAMINOPHEN) 1/2-1 by mouth every 4 hours as needed for severe pain symptoms  #60 x 1   Entered and Authorized by:   Newt Lukes MD   Signed by:   Newt Lukes MD on 03/25/2010   Method used:   Printed then faxed to ...       [facility] (mail-order)             , [place]         Ph: [phone]       Fax: [phone]   RxID:   [id number] ZOLPIDEM TARTRATE 5 MG TABS (ZOLPIDEM TARTRATE) 1/2-1 tab by mouth at bedtime as needed for sleep  #30 x 2   Entered and Authorized by:   Newt Lukes MD   Signed by:   Newt Lukes MD on 03/25/2010   Method used:   Printed then faxed to ...       [facility] (mail-order)             , [place]         Ph: [phone]       Fax: [phone]   RxID:   [id number]    Orders Added: 1)  TLB-A1C / Hgb A1C (Glycohemoglobin) [83036-A1C] 2)  Rheumatology Referral [Rheumatology] 3)  Est. Patient Level IV [80998]

## 2010-05-20 NOTE — Letter (Signed)
Summary: [facility]  [facility]   Imported By: Sherian Rein 02/04/2010 13:11:15  _____________________________________________________________________  External Attachment:    Type:   Image     Comment:   External Document

## 2010-05-20 NOTE — Progress Notes (Signed)
Summary: simvastatin  Phone Note Refill Request Message from:  Fax from Pharmacy on March 18, 2009 3:52 PM  Refills Requested: Medication #1:  SIMVASTATIN 40 MG TABS one by mouth daily  Method Requested: Electronic Initial call taken by: Orlan Leavens,  March 18, 2009 3:52 PM    Prescriptions: SIMVASTATIN 40 MG TABS (SIMVASTATIN) one by mouth daily  #90 x 3   Entered by:   Orlan Leavens   Authorized by:   Newt Lukes MD   Signed by:   Orlan Leavens on 03/18/2009   Method used:   Electronically to        [facility] (retail)       [address]       [place], [place]  [address]       Ph: [phone] or [phone]       Fax: [phone]   RxID:   [id number]

## 2010-05-20 NOTE — Miscellaneous (Signed)
  Clinical Lists Changes  Medications: Removed medication of SIMVASTATIN 40 MG TABS (SIMVASTATIN) one half by mouth daily Added new medication of LIPITOR 40 MG TABS (ATORVASTATIN CALCIUM) 1 tab at bedtime  FILL WITH GENERIC - Signed Rx of LIPITOR 40 MG TABS (ATORVASTATIN CALCIUM) 1 tab at bedtime  FILL WITH GENERIC;  #90 x 3;  Signed;  Entered by: Layne Benton, RN, BSN;  Authorized by: Sherrill Raring, MD, [facility];  Method used: Electronically to [facility]*, , , [place]  , Ph: [phone], Fax: [phone]    Prescriptions: LIPITOR 40 MG TABS (ATORVASTATIN CALCIUM) 1 tab at bedtime  FILL WITH GENERIC  #90 x 3   Entered by:   Layne Benton, RN, BSN   Authorized by:   Sherrill Raring, MD, [facility]   Signed by:   Layne Benton, RN, BSN on 02/24/2010   Method used:   Electronically to        [facility]* (mail-order)             , [place]         Ph: [phone]       Fax: [phone]   RxID:   [id number]

## 2010-05-20 NOTE — Progress Notes (Signed)
Summary: cetirizine  Phone Note Refill Request Message from:  Fax from Pharmacy on November 21, 2009 11:41 AM  Refills Requested: Medication #1:  CETIRIZINE HCL 10 MG TABS take 1 by mouth once daily [facility] [phone]   Method Requested: Fax to Mail Away Pharmacy Initial call taken by: Orlan Leavens RMA,  November 21, 2009 11:42 AM    Prescriptions: CETIRIZINE HCL 10 MG TABS (CETIRIZINE HCL) take 1 by mouth once daily  #90 Tablet x 0   Entered by:   Orlan Leavens RMA   Authorized by:   Newt Lukes MD   Signed by:   Orlan Leavens RMA on 11/21/2009   Method used:   Electronically to        [facility] (mail-order)             , [place]         Ph: [phone]       Fax: [phone]   RxID:   [id number]   Appended Document: cetirizine    Clinical Lists Changes  Medications: Rx of CETIRIZINE HCL 10 MG TABS (CETIRIZINE HCL) take 1 by mouth once daily;  #90 Tablet x 0;  Signed;  Entered by: Orlan Leavens RMA;  Authorized by: Newt Lukes MD;  Method used: Faxed to [facility]*, , , [place]  , Ph: [phone], Fax: [phone]    Prescriptions: CETIRIZINE HCL 10 MG TABS (CETIRIZINE HCL) take 1 by mouth once daily  #90 Tablet x 0   Entered by:   Orlan Leavens RMA   Authorized by:   Newt Lukes MD   Signed by:   Orlan Leavens RMA on 11/21/2009   Method used:   Faxed to ...       [facility] (mail-order)             , [place]         Ph: [phone]       Fax: [phone]   RxID:   [id number]

## 2010-05-20 NOTE — Letter (Signed)
Summary: Office Visit Letter   Gastroenterology  [address] [place], [place] [address]   Phone: [phone]  Fax: [phone]      December 24, 2009 MRN: [id number]   [facility] [address] [place], [place]  [address]   Dear Ms. Aydin,   According to our records, it is time for you to schedule a follow-up office visit with [place] in November.   At your convenience, please call [phone] (option #2)to schedule an office visit. If you have any questions, concerns, or feel that this letter is in error, we would appreciate your call.   Sincerely,  Judie Petit T. Russella Dar, M.D.  [facility] [phone]

## 2010-05-20 NOTE — Letter (Signed)
Summary: Results Follow-up Letter  [facility]  [address] [place], [place] [address]   Phone: [phone]  Fax: [phone]    11/27/2008  [address] [place], [place]  [address]  Dear Ms. Kling,   The following are the results of your recent test 11/27/08  Test     Result     CBCD              Continued improvement in anemia-no need for GI evaluation at this time. Attached a copy of your bloodwork for your records.    Sincerely,  Westley Hummer [facility]

## 2010-05-20 NOTE — Assessment & Plan Note (Signed)
Summary: SHOULDER PAIN/NWS  #   Vital Signs:  Patient profile:   77 year old female Height:      60 inches (152.40 cm) Weight:      161.0 pounds (73.18 kg) O2 Sat:      95 % on Room air Temp:     98.4 degrees F (36.89 degrees C) oral Pulse rate:   70 / minute BP sitting:   122 / 72  (left arm) Cuff size:   regular  Vitals Entered By: Orlan Leavens RMA (November 28, 2009 8:59 AM)  O2 Flow:  Room air CC: (R) shoulder & hip pain Is Patient Diabetic? Yes Did you bring your meter with you today? No Pain Assessment Patient in pain? yes     Location: (R) shoulder & hip Type: aching  Does patient need assistance? Functional Status Self care Ambulation Normal Comments Pt req refill on her Penlac   Primary Care Provider:  Newt Lukes, MD  CC:  (R) shoulder & hip pain.  History of Present Illness: c/o increase in R shoulder pain chronic condition - but worse in past 3 weeks no new injury or trauma trying water aerobics to help with left shoulder (as wishes to not undergo total shoulder replacment) and feels she has "overused and flared up" right side because of same only other joint flare - pain is in in right "sit bone" beneath buttock no med changes  also reviewed priorOV: 1) HTN - reports compliance with ongoing medical treatment and no changes in medication dose or frequency. denies adverse side effects related to current therapy.   2) dyslipidemia - reports compliance with ongoing medical treatment and no changes in medication dose or frequency. denies adverse side effects related to current therapy.   3) DM2 - reports compliance with ongoing medical treatment and no changes in medication dose or frequency. denies adverse side effects related to current therapy. no hypoglycemia but high readings after steroids use  4) diffuse, inflammatory arthritis - see above- participating in water areobics - compliants with mobic dose as rx'd  Current Medications (verified): 1)   Norvasc 5 Mg Tabs (Amlodipine Besylate) .Marland Kitchen.. 1 Every Day 2)  Mobic 7.5 Mg Tabs (Meloxicam) .Marland Kitchen.. 1 By Mouth Once Daily 3)  Simvastatin 40 Mg Tabs (Simvastatin) .... One By Mouth Daily 4)  Metformin Hcl 500 Mg Tabs (Metformin Hcl) .... Take 1 Two Times A Day 5)  Altace 5 Mg Caps (Ramipril) .Marland Kitchen.. 1 Every Day 6)  Fish Oil 1200 Mg Caps (Omega-3 Fatty Acids) .... Take One By Mouth Once Daily 7)  Dexilant 60 Mg Cpdr (Dexlansoprazole) .Marland Kitchen.. 1 By Mouth Once Daily 8)  Multivitamins   Tabs (Multiple Vitamin) .... Daily 9)  Calcium Carbonate   Powd (Calcium Carbonate) .... 600mg  With V It D  Daily 10)  Vitamin C 500 Mg  Tabs (Ascorbic Acid) .... Daily 11)  Vitamin B Complex-C   Caps (B Complex-C) .... Daily 12)  Zinc .... Daily 13)  Penlac 8 % Soln (Ciclopirox) .... Apply To Affected Nails At Bedtime 14)  Cetirizine Hcl 10 Mg Tabs (Cetirizine Hcl) .... Take 1 By Mouth Once Daily 15)  Tramadol Hcl 50 Mg Tabs (Tramadol Hcl) .Marland Kitchen.. 1-2 By Mouth Four Times A Day As Needed For Arthritis Pain 16)  Zolpidem Tartrate 5 Mg Tabs (Zolpidem Tartrate) .... 1/2-1 Tab By Mouth At Bedtime As Needed For Sleep 17)  Carvedilol 6.25 Mg Tabs (Carvedilol) .Marland Kitchen.. 1 Tab Two Times A Day  Allergies (verified): No  Known Drug Allergies  Past History:  Past Medical History: CORONARY ARTERY DISEASE DYSLIPIDEMIA DIABETES type 2 HYPERTENSION DIVERTICULOSIS, COLON GERD w/ HH H. pylori gastritis treated 02/2009 ARTHRITIS -gout +OA, severe polyarthritis with chronic inflammatory changes Hemorrhoids Tubular adenoma polyp 02/2007   MD roster: card- [facility] - graves (knees), chandler (shoulder)  Review of Systems  The patient denies fever, chest pain, and headaches.    Physical Exam  General:  alert and overweight-appearing. nontoxic, very nice  Lungs:  normal respiratory effort, no intercostal retractions or use of accessory muscles; normal breath sounds bilaterally - no crackles and no wheezes.    Heart:  normal rate,  regular rhythm, no murmur, and no rub. BLE without edema. Msk:  left and right shoulder with +impingment signs and decreased ROM with lateral abduction (pain if mvmt beyond 80 degrees away from body) - mild pain to palp of shoulder over right biceps groove > left side Psych:  Oriented X3, memory intact for recent and remote, normally interactive, good eye contact, not anxious appearing, not depressed appearing, and not agitated.      Impression & Recommendations:  Problem # 1:  PAIN IN JOINT, SHOULDER REGION (ICD-719.41) severe DJD on left - suspect same on right - check xray now pt wishes to not undergo total shoulder replacement as rec by ortho - tx with pred pack for acute inflam - ok to cont mobic too also rx muscle relaxant - robaxin refer to PT as per pt request - ok to cont water aerobics as tol  f/u with ortho if not improved to consider intraartic steroid injections as needed  Her updated medication list for this problem includes:    Mobic 7.5 Mg Tabs (Meloxicam) .Marland Kitchen... 1 by mouth once daily    Tramadol Hcl 50 Mg Tabs (Tramadol hcl) .Marland Kitchen... 1-2 by mouth four times a day as needed for arthritis pain    Robaxin 500 Mg Tabs (Methocarbamol) .Marland Kitchen... 1 by mouth every 8 hours as needed for muscle spasm  Orders: T-Shoulder Right (73030TC) Prescription Created Electronically [phone]) Physical Therapy Referral (PT)  Problem # 2:  ENTHESOPATHY OF HIP REGION (ICD-726.5) tx as above Orders: Prescription Created Electronically [phone]) Physical Therapy Referral (PT)  Complete Medication List: 1)  Norvasc 5 Mg Tabs (Amlodipine besylate) .Marland Kitchen.. 1 every day 2)  Mobic 7.5 Mg Tabs (Meloxicam) .Marland Kitchen.. 1 by mouth once daily 3)  Simvastatin 40 Mg Tabs (Simvastatin) .... One by mouth daily 4)  Metformin Hcl 500 Mg Tabs (Metformin hcl) .... Take 1 two times a day 5)  Altace 5 Mg Caps (Ramipril) .Marland Kitchen.. 1 every day 6)  Fish Oil 1200 Mg Caps (Omega-3 fatty acids) .... Take one by mouth once daily 7)  Dexilant  60 Mg Cpdr (Dexlansoprazole) .Marland Kitchen.. 1 by mouth once daily 8)  Multivitamins Tabs (Multiple vitamin) .... Daily 9)  Calcium Carbonate Powd (Calcium carbonate) .... 600mg  with v it d  daily 10)  Vitamin C 500 Mg Tabs (Ascorbic acid) .... Daily 11)  Vitamin B Complex-c Caps (B complex-c) .... Daily 12)  Zinc  .... Daily 13)  Penlac 8 % Soln (Ciclopirox) .... Apply to affected nails at bedtime 14)  Cetirizine Hcl 10 Mg Tabs (Cetirizine hcl) .... Take 1 by mouth once daily 15)  Tramadol Hcl 50 Mg Tabs (Tramadol hcl) .Marland Kitchen.. 1-2 by mouth four times a day as needed for arthritis pain 16)  Zolpidem Tartrate 5 Mg Tabs (Zolpidem tartrate) .... 1/2-1 tab by mouth at bedtime as needed for sleep  17)  Carvedilol 6.25 Mg Tabs (Carvedilol) .Marland Kitchen.. 1 tab two times a day 18)  Prednisone (pak) 10 Mg Tabs (Prednisone) .... As directed x 6 days 19)  Robaxin 500 Mg Tabs (Methocarbamol) .Marland Kitchen.. 1 by mouth every 8 hours as needed for muscle spasm  Patient Instructions: 1)  it was good to see you today. 2)  xray ordered today - your results will be called to you in 48-72 hours from the time of test completion 3)  pred pack for inflammation and robaxin for muscle spasm - your prescriptions have been electronically submitted to your pharmacy. Please take as directed. Contact our office if you believe you're having problems with the medication(s).  4)  we'll make referral to physical therapy. Our office will contact you regarding this appointment once made.  5)  Please schedule a follow-up appointment as needed. (or as previously scheduled) Prescriptions: ROBAXIN 500 MG TABS (METHOCARBAMOL) 1 by mouth every 8 hours as needed for muscle spasm  #40 x 0   Entered and Authorized by:   Newt Lukes MD   Signed by:   Newt Lukes MD on 11/28/2009   Method used:   Electronically to        [id number] ([facility]       [address]       [place], [place]  [address]       Ph: [phone] or [phone]       Fax:  [phone]   RxID:   [id number] PREDNISONE (PAK) 10 MG TABS (PREDNISONE) as directed x 6 days  #1 x 0   Entered and Authorized by:   Newt Lukes MD   Signed by:   Newt Lukes MD on 11/28/2009   Method used:   Electronically to        [id number] (retail)       [address]       [place], [place]  [address]       Ph: [phone] or [phone]       Fax: [phone]   RxID:   [id number] PENLAC 8 % SOLN (CICLOPIROX) apply to affected nails at bedtime  #6.6 Millilit x 0   Entered by:   Orlan Leavens RMA   Authorized by:   Newt Lukes MD   Signed by:   Orlan Leavens RMA on 11/28/2009   Method used:   Electronically to        [id number] ([facility]       [address]       [place], [place]  [address]       Ph: [phone] or [phone]       Fax: [phone]   RxID:   [id number]

## 2010-05-20 NOTE — Progress Notes (Signed)
Summary: BP  Phone Note Other Incoming    Follow-up for Phone Call        Patient states that she went to the [facility]acy because she felt dizzy. They took her BP automatic 192/79 and manual 200/90. She did no take her medication until 10 am. She was at the clinic at  about 1015 am. She walked into our clinic at 1045 am and I took her BP and it was 150/70 ( both arms) with a regular Heart rate of 84. Presently she has no complaints of dizziness or headache. 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   She states that sometimes she has a dry cough and gets a headache and she is concerned that at these times her BP might be high. I advised her to get a home BP cuff but she states that  she can't afford to buy one. She is also concerned that she has occ.palps the last 2 weeks. Advised that I would discuss issues with Dr.Ross tomorrow and call her on the phone but she wanted to see her in clinic so I made her an appointment for 12/16 at 915 am with Dr.Ross. Follow-up by: Suzan Garibaldi RN

## 2010-05-20 NOTE — Progress Notes (Signed)
Summary: cetrizine  ---- Converted from flag ---- ---- 11/21/2009 11:45 AM, Orlan Leavens RMA wrote: Failed eScript: Occupational hygienist;  Internal processing error has occurred. Message id - [phone] [id number]. Dr Name - Felicity Coyer, Raenette Rover. Pharmacy Name - [facility]  Pharmacy: [facility]  Medication: CETIRIZINE HCL 10 MG TABS;  Instructions: take 1 by mouth once daily;  Quantity: 90 Tablet;  Refills: 0;  Entry Date: 2009-11-21;  Transaction ID: [id number];   ------------------------------       Additional Follow-up for Phone Call Additional follow up Details #2::    [facility] Follow-up by: Orlan Leavens RMA,  November 21, 2009 11:48 AM

## 2010-05-20 NOTE — Assessment & Plan Note (Signed)
Summary: cold symptoms/leschber/cd   Vital Signs:  Patient profile:   77 year old female Height:      60 inches Weight:      162 pounds BMI:     31.75 O2 Sat:      98 % on Room air Temp:     98.5 degrees F oral Pulse rate:   71 / minute BP sitting:   132 / 70  (left arm) Cuff size:   regular  Vitals Entered ByZella Ball Ewing (September 18, 2009 1:45 PM)  O2 Flow:  Room air CC: Cough, congestion throat pain/RE   Primary Care Provider:  Newt Lukes, MD  CC:  Cough and congestion throat pain/RE.  History of Present Illness: here with acute onset 3 -4 days onset mild to mod ST, fever, prod cough wtih greenish sputum, with mild wheezing this am as well with hard to take deep breaths,  Pt denies CP, sob, doe, wheezing, orthopnea, pnd, worsening LE edema, palps, dizziness or syncope .  Pt denies new neuro symptoms such as headache, facial or extremity weakness   Does have significant insomnia most nights, increased stress, and denies worsening depressive symptoms, or suicidal ideation, or panic.   Problems Prior to Update: 1)  Palpitations, Recurrent  (ICD-785.1) 2)  Headache  (ICD-784.0) 3)  Gastritis  (ICD-535.50) 4)  Dry Skin  (ICD-701.1) 5)  Dermatophytosis of Nail  (ICD-110.1) 6)  Diabetes Mellitus, Type II  (ICD-250.00) 7)  Hyperlipidemia  (ICD-272.4) 8)  Hypertension  (ICD-401.9) 9)  Arthritis  (ICD-716.90) 10)  Cad, Native Vessel  (ICD-414.01) 11)  Unspecified Hemorrhage of Gastrointestinal Tract  (ICD-578.9) 12)  Fecal Occult Blood  (ICD-792.1) 13)  Unspecified Anemia  (ICD-285.9) 14)  Colonic Polyps, Hx of  (ICD-V12.72) 15)  Hiatal Hernia  (ICD-553.3) 16)  Hemorrhoids, Internal  (ICD-455.0) 17)  Diverticulosis, Colon  (ICD-562.10) 18)  Tubulovillous Adenoma, Colon  (ICD-211.3) 19)  Gerd  (ICD-530.81) 20)  Fibromyalgia  (ICD-729.1)  Medications Prior to Update: 1)  Norvasc 5 Mg Tabs (Amlodipine Besylate) .Marland Kitchen.. 1 Every Day 2)  Mobic 15 Mg Tabs (Meloxicam) .Marland Kitchen.. 1  Every Day 3)  Simvastatin 40 Mg Tabs (Simvastatin) .... One By Mouth Daily 4)  Metformin Hcl 500 Mg Tabs (Metformin Hcl) .... Take 1 Two Times A Day 5)  Altace 5 Mg Caps (Ramipril) .Marland Kitchen.. 1 Every Day 6)  Fish Oil 1200 Mg Caps (Omega-3 Fatty Acids) .... Take One By Mouth Once Daily 7)  Dexilant 60 Mg Cpdr (Dexlansoprazole) .Marland Kitchen.. 1 By Mouth Once Daily 8)  Multivitamins   Tabs (Multiple Vitamin) .... Daily 9)  Calcium Carbonate   Powd (Calcium Carbonate) .... 600mg  With V It D  Daily 10)  Vitamin C 500 Mg  Tabs (Ascorbic Acid) .... Daily 11)  Vitamin B Complex-C   Caps (B Complex-C) .... Daily 12)  Zinc .... Daily 13)  Selenium .... Daily 14)  Penlac 8 % Soln (Ciclopirox) .... Apply To Affected Nails At Bedtime 15)  Cetirizine Hcl 10 Mg Tabs (Cetirizine Hcl) .... Take 1 By Mouth Once Daily 16)  Tramadol Hcl 50 Mg Tabs (Tramadol Hcl) .Marland Kitchen.. 1-2 By Mouth Four Times A Day As Needed For Arthritis Pain 17)  Zolpidem Tartrate 5 Mg Tabs (Zolpidem Tartrate) .... 1/2-1 Tab By Mouth At Bedtime As Needed For Sleep 18)  Carvedilol 6.25 Mg Tabs (Carvedilol) .Marland Kitchen.. 1 Tab Two Times A Day  Current Medications (verified): 1)  Norvasc 5 Mg Tabs (Amlodipine Besylate) .Marland Kitchen.. 1 Every Day 2)  Mobic 15 Mg Tabs (Meloxicam) .Marland Kitchen.. 1 Every Day 3)  Simvastatin 40 Mg Tabs (Simvastatin) .... One By Mouth Daily 4)  Metformin Hcl 500 Mg Tabs (Metformin Hcl) .... Take 1 Two Times A Day 5)  Altace 5 Mg Caps (Ramipril) .Marland Kitchen.. 1 Every Day 6)  Fish Oil 1200 Mg Caps (Omega-3 Fatty Acids) .... Take One By Mouth Once Daily 7)  Dexilant 60 Mg Cpdr (Dexlansoprazole) .Marland Kitchen.. 1 By Mouth Once Daily 8)  Multivitamins   Tabs (Multiple Vitamin) .... Daily 9)  Calcium Carbonate   Powd (Calcium Carbonate) .... 600mg  With V It D  Daily 10)  Vitamin C 500 Mg  Tabs (Ascorbic Acid) .... Daily 11)  Vitamin B Complex-C   Caps (B Complex-C) .... Daily 12)  Zinc .... Daily 13)  Selenium .... Daily 14)  Penlac 8 % Soln (Ciclopirox) .... Apply To Affected Nails  At Bedtime 15)  Cetirizine Hcl 10 Mg Tabs (Cetirizine Hcl) .... Take 1 By Mouth Once Daily 16)  Tramadol Hcl 50 Mg Tabs (Tramadol Hcl) .Marland Kitchen.. 1-2 By Mouth Four Times A Day As Needed For Arthritis Pain 17)  Zolpidem Tartrate 5 Mg Tabs (Zolpidem Tartrate) .... 1/2-1 Tab By Mouth At Bedtime As Needed For Sleep 18)  Carvedilol 6.25 Mg Tabs (Carvedilol) .Marland Kitchen.. 1 Tab Two Times A Day 19)  Azithromycin 250 Mg Tabs (Azithromycin) .... 2po Qd For 1 Day, Then 1po Qd For 4days, Then Stop 20)  Hydrocodone-Homatropine 5-1.5 Mg/84ml Syrp (Hydrocodone-Homatropine) .Marland Kitchen.. 1 Tsp By Mouth Q 6 Hrs As Needed 21)  Prednisone 10 Mg Tabs (Prednisone) .... 3po Qd For 3days, Then 2po Qd For 3days, Then 1po Qd For 3days, Then Stop  Allergies (verified): No Known Drug Allergies  Past History:  Past Medical History: Last updated: 08/13/2009 CORONARY ARTERY DISEASE DYSLIPIDEMIA DIABETES type 2 HYPERTENSION DIVERTICULOSIS, COLON (ICD-562.10) GERD w/ HH H. pylori gastritis treated 02/2009 ARTHRITIS gout Hemorrhoids Tubular adenoma polyp 02/2007 Fibromyalgia  MD rooster: ortho - graves (knees), chandler (shoulder)  Past Surgical History: Last updated: 03/12/2009 CABG Feb 2010 Inguinal Hernia Repair Hysterectomy (1981) Appendectomy Tubaligation Bilat knee replacements Left RCT Repair Herniated disc (x2) Ovarian cystectomy Cataract Extraction bilateral   Social History: Last updated: 09/18/2008 Widowed.  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  , mild ill  Head:  normocephalic and atraumatic.   Eyes:  vision grossly intact, pupils equal, and pupils round.   Ears:  bilat tm's red, sinus nontender Nose:  nasal dischargemucosal pallor and mucosal edema.   Mouth:  pharyngeal erythema  and fair dentition.   Neck:  supple and no masses.   Lungs:  normal respiratory effort, R decreased breath sounds, R wheezes, L decreased breath sounds, and L wheezes.   Heart:  normal rate and regular rhythm.   Extremities:  no edema, no erythema    Impression & Recommendations:  Problem # 1:  BRONCHITIS-ACUTE (ICD-466.0)  Her updated medication list for this problem includes:    Azithromycin 250 Mg Tabs (Azithromycin) .Marland Kitchen... 2po qd for 1 day, then 1po qd for 4days, then stop    Hydrocodone-homatropine 5-1.5 Mg/43ml Syrp (Hydrocodone-homatropine) .Marland Kitchen... 1 tsp by mouth q 6 hrs as needed treat as above, f/u any worsening signs or symptoms   Problem # 2:  WHEEZING (  ICD-786.07) most likely realted to above, for depo shot today, and pred pack  Orders: Admin of Therapeutic Inj  intramuscular or subcutaneous (09811) Depo- Medrol 80mg  (J1040) Depo- Medrol 40mg  (J1030)  Problem # 3:  INSOMNIA-SLEEP DISORDER-UNSPEC (ICD-780.52)  Her updated medication list for this problem includes:    Zolpidem Tartrate 5 Mg Tabs (Zolpidem tartrate) .Marland Kitchen... 1/2-1 tab by mouth at bedtime as needed for sleep treat as above, f/u any worsening signs or symptoms   Problem # 4:  DIABETES MELLITUS, TYPE II (ICD-250.00)  Her updated medication list for this problem includes:    Metformin Hcl 500 Mg Tabs (Metformin hcl) .Marland Kitchen... Take 1 two times a day    Altace 5 Mg Caps (Ramipril) .Marland Kitchen... 1 every day  Labs Reviewed: Creat: 0.8 (04/15/2009)    Reviewed HgBA1c results: 6.0 (10/15/2008) stable overall by hx and exam, ok to continue meds/tx as is, d/w pt - to call for cbg > 200 on the steroid tx  Complete Medication List: 1)  Norvasc 5 Mg Tabs (Amlodipine besylate) .Marland Kitchen.. 1 every day 2)  Mobic 15 Mg Tabs (Meloxicam) .Marland Kitchen.. 1 every day 3)  Simvastatin 40 Mg Tabs (Simvastatin) .... One by mouth daily 4)  Metformin Hcl 500 Mg Tabs (Metformin hcl) .... Take 1 two times a day 5)  Altace 5 Mg Caps (Ramipril) .Marland Kitchen.. 1 every day 6)   Fish Oil 1200 Mg Caps (Omega-3 fatty acids) .... Take one by mouth once daily 7)  Dexilant 60 Mg Cpdr (Dexlansoprazole) .Marland Kitchen.. 1 by mouth once daily 8)  Multivitamins Tabs (Multiple vitamin) .... Daily 9)  Calcium Carbonate Powd (Calcium carbonate) .... 600mg  with v it d  daily 10)  Vitamin C 500 Mg Tabs (Ascorbic acid) .... Daily 11)  Vitamin B Complex-c Caps (B complex-c) .... Daily 12)  Zinc  .... Daily 13)  Selenium  .... Daily 14)  Penlac 8 % Soln (Ciclopirox) .... Apply to affected nails at bedtime 15)  Cetirizine Hcl 10 Mg Tabs (Cetirizine hcl) .... Take 1 by mouth once daily 16)  Tramadol Hcl 50 Mg Tabs (Tramadol hcl) .Marland Kitchen.. 1-2 by mouth four times a day as needed for arthritis pain 17)  Zolpidem Tartrate 5 Mg Tabs (Zolpidem tartrate) .... 1/2-1 tab by mouth at bedtime as needed for sleep 18)  Carvedilol 6.25 Mg Tabs (Carvedilol) .Marland Kitchen.. 1 tab two times a day 19)  Azithromycin 250 Mg Tabs (Azithromycin) .... 2po qd for 1 day, then 1po qd for 4days, then stop 20)  Hydrocodone-homatropine 5-1.5 Mg/38ml Syrp (Hydrocodone-homatropine) .Marland Kitchen.. 1 tsp by mouth q 6 hrs as needed 21)  Prednisone 10 Mg Tabs (Prednisone) .... 3po qd for 3days, then 2po qd for 3days, then 1po qd for 3days, then stop  Patient Instructions: 1)  you had the steroid shot today 2)  Please take all new medications as prescribed 3)  Continue all previous medications as before this visit  4)  Please schedule an appointment with your primary doctor as needed Prescriptions: PREDNISONE 10 MG TABS (PREDNISONE) 3po qd for 3days, then 2po qd for 3days, then 1po qd for 3days, then stop  #18 x 0   Entered and Authorized by:   Corwin Levins MD   Signed by:   Corwin Levins MD on 09/18/2009   Method used:   Print then Give to Patient   RxID:   [id number] HYDROCODONE-HOMATROPINE 5-1.5 MG/5ML SYRP (HYDROCODONE-HOMATROPINE) 1 tsp by mouth q 6 hrs as needed  #6 oz x 1   Entered and  Authorized by:   Corwin Levins MD   Signed by:   Corwin Levins MD on 09/18/2009   Method used:   Print then Give to Patient   RxID:   [id number] AZITHROMYCIN 250 MG TABS (AZITHROMYCIN) 2po qd for 1 day, then 1po qd for 4days, then stop  #6 x 1   Entered and Authorized by:   Corwin Levins MD   Signed by:   Corwin Levins MD on 09/18/2009   Method used:   Print then Give to Patient   RxID:   [id number]    Medication Administration  Injection # 1:    Medication: Depo- Medrol 40mg     Diagnosis: WHEEZING (ICD-786.07)    Route: IM    Site: LUOQ gluteus    Exp Date: 07/19/2012    Lot #: [id number]    Mfr: Pharmacia    Patient tolerated injection without complications    Given by: Margaret Pyle, CMA (September 18, 2009 4:48 PM)  Injection # 2:    Medication: Depo- Medrol 80mg     Diagnosis: WHEEZING (ICD-786.07)    Route: IM    Site: LUOQ gluteus    Exp Date: 07/19/2012    Lot #: [id number]    Mfr: [employer]    Patient tolerated injection without complications    Given by: Margaret Pyle, CMA (September 18, 2009 4:49 PM)  Orders Added: 1)  Admin of Therapeutic Inj  intramuscular or subcutaneous [96372] 2)  Depo- Medrol 80mg  [J1040] 3)  Depo- Medrol 40mg  [J1030] 4)  Est. Patient Level IV [88416]

## 2010-05-20 NOTE — Progress Notes (Signed)
Summary: tramadol  Phone Note Refill Request Message from:  Fax from Pharmacy on July 30, 2009 9:14 AM  Refills Requested: Medication #1:  TRAMADOL HCL 50 MG TABS 1-2 by mouth four times a day as needed for arthritis pain   Last Refilled: 12/25/2008  Method Requested: Electronic Initial call taken by: Orlan Leavens,  July 30, 2009 9:14 AM    Prescriptions: TRAMADOL HCL 50 MG TABS (TRAMADOL HCL) 1-2 by mouth four times a day as needed for arthritis pain  #50 x 1   Entered by:   Orlan Leavens   Authorized by:   Newt Lukes MD   Signed by:   Orlan Leavens on 07/30/2009   Method used:   Electronically to        [facility] ([facility]       [address]       [place], [place]  [address]       Ph: [phone] or [phone]       Fax: [phone]   RxID:   [id number]

## 2010-05-20 NOTE — Letter (Signed)
Summary: [facility] Consult Scheduled Letter  [facility]  [address] [place], [place] [address]   Phone: [phone]  Fax: [phone]      04/15/2009 MRN: [id number]  [facility] [address] [place], [place]  [address]    Dear Ms. Holstine,      We have scheduled an appointment for you.  At the recommendation of Dr.Leschber, we have scheduled you a consult with [facility] (Dr. Tenny Craw) on January 20,2010 at 3:15 pm.Their phone number is [phone] .If this appointment day and time is not convenient for you, please feel free to call the office of the doctor you are being referred to at the number listed above and reschedule the appointment.   [facility], [address],  [place] [address] [place], [place] [address]  Phone:[phone]    Thank you,  Patient Care Coordinator [facility]

## 2010-05-20 NOTE — Progress Notes (Signed)
  Walk in Patient Form Recieved " Patient needs form filled out and faxed to [phone]. please call when done"  will forward to Message Nurse  [facility]  December 05, 2008 1:14 PM     Appended Document:  Pt. aware that surgical clearance for cataract extraction was faxed to [facility] along with her current hospital and office records.

## 2010-05-20 NOTE — Assessment & Plan Note (Signed)
Summary: ARTHRITIS PAIN/NWS  #   Vital Signs:  Patient profile:   77 year old female Height:      59 inches (149.86 cm) Weight:      162.12 pounds (73.69 kg) O2 Sat:      95 % on Room air Temp:     97.3 degrees F (36.28 degrees C) oral Pulse rate:   64 / minute BP sitting:   130 / 60  (right arm) Cuff size:   regular  Vitals Entered By: Orlan Leavens (July 29, 2009 1:32 PM)  O2 Flow:  Room air CC: Arthritis pain Is Patient Diabetic? Yes Did you bring your meter with you today? No Pain Assessment Patient in pain? yes     Location: (L) shoulder Type: aching   Primary Care Provider:  Newt Lukes, MD  CC:  Arthritis pain.  History of Present Illness: here with flare of arthritis pain -  esp in knees and left shoulder - worse with cold weather - no swelling - still taking mobic - participating in water aerobics believes if she could sleep, the pain would be tolerable  Clinical Review Panels:  Diabetes Management   HgBA1C:  6.0 (10/15/2008)   Creatinine:  0.8 (04/15/2009)   Last Flu Vaccine:  Fluvax 3+ (01/17/2009)  CBC   WBC:  5.8 (04/15/2009)   RBC:  4.09 (04/15/2009)   Hgb:  12.8 (04/15/2009)   Hct:  38.4 (04/15/2009)   Platelets:  174.0 (04/15/2009)   MCV  93.9 (04/15/2009)   MCHC  33.2 (04/15/2009)   RDW  14.5 (04/15/2009)   PMN:  49.0 (04/15/2009)   Lymphs:  44.3 (04/15/2009)   Monos:  5.1 (04/15/2009)   Eosinophils:  1.3 (04/15/2009)   Basophil:  0.3 (04/15/2009)  Complete Metabolic Panel   Glucose:  78 (04/15/2009)   Sodium:  139 (04/15/2009)   Potassium:  4.6 (04/15/2009)   Chloride:  104 (04/15/2009)   CO2:  27 (04/15/2009)   BUN:  14 (04/15/2009)   Creatinine:  0.8 (04/15/2009)   Calcium:  9.5 (04/15/2009)   SGOT (AST):  24 (03/12/2009)   Current Medications (verified): 1)  Simvastatin 40 Mg Tabs (Simvastatin) .... One By Mouth Daily 2)  Rozerem 8 Mg Tabs (Ramelteon) .... As Needed 3)  Metformin Hcl 500 Mg Tabs (Metformin Hcl)  .... Take 1 Two Times A Day 4)  Multivitamins   Tabs (Multiple Vitamin) .... Daily 5)  Calcium Carbonate   Powd (Calcium Carbonate) .... 600mg  With V It D  Daily 6)  Vitamin C 500 Mg  Tabs (Ascorbic Acid) .... Daily 7)  Fish Oil 1200 Mg Caps (Omega-3 Fatty Acids) .... Take One By Mouth Once Daily 8)  Vitamin B Complex-C   Caps (B Complex-C) .... Daily 9)  Zinc .... Daily 10)  Selenium .... Daily 11)  Dexilant 60 Mg Cpdr (Dexlansoprazole) .Marland Kitchen.. 1 By Mouth Once Daily 12)  Tramadol Hcl 50 Mg Tabs (Tramadol Hcl) .Marland Kitchen.. 1-2 By Mouth Qid As Needed For Arthritis Pain 13)  Penlac 8 % Soln (Ciclopirox) .... Apply To Affected Nails At Bedtime 14)  Cetirizine Hcl 10 Mg Tabs (Cetirizine Hcl) .... Take 1 By Mouth Once Daily 15)  Altace 5 Mg Caps (Ramipril) .Marland Kitchen.. 1 Every Day 16)  Mobic 15 Mg Tabs (Meloxicam) .Marland Kitchen.. 1 Every Day 17)  Metoprolol Tartrate 25 Mg Tabs (Metoprolol Tartrate) .... One Half A Tablet 2 Times Per Day 18)  Norvasc 5 Mg Tabs (Amlodipine Besylate) .Marland Kitchen.. 1 Every Day  Allergies (verified): No  Known Drug Allergies  Past History:  Past Medical History: CORONARY ARTERY DISEASE DYSLIPIDEMIA DIABETES type 2 HYPERTENSION DIVERTICULOSIS, COLON (ICD-562.10) GERD w/ HH H. pylori gastritis treated 02/2009 ARTHRITIS gout Hemorrhoids Tubular adenoma polyp 02/2007 Fibromyalgia  MD rooster: ortho - graves (knees), wong  Review of Systems  The patient denies fever, chest pain, syncope, and headaches.    Physical Exam  General:  alert, well-developed, well-nourished, and cooperative to examination.    Lungs:  normal respiratory effort, no intercostal retractions or use of accessory muscles; normal breath sounds bilaterally - no crackles and no wheezes.    Heart:  normal rate, regular rhythm, no murmur, and no rub. BLE without edema. Msk:  left shoulder with +impingment signs and decreased ROM with lateral abduction (pain if mvmt beyond 80 degrees away from body) - no pain to palp of  shoulder   Impression & Recommendations:  Problem # 1:  ARTHRITIS (ICD-716.90) to f/u with ortho as prev advised to consider shoulder replacment (prior to CABG) last year max med mgmt of pain wih combo of mobic, tylenol and tramadol as well as heat and exercise reassurance provided - ?repeat steroid injection if pt remains comitted to nonop mgmt of pain (also declines rx for narc offered today)  Complete Medication List: 1)  Norvasc 5 Mg Tabs (Amlodipine besylate) .Marland Kitchen.. 1 every day 2)  Metoprolol Tartrate 25 Mg Tabs (Metoprolol tartrate) .... One half a tablet 2 times per day 3)  Mobic 15 Mg Tabs (Meloxicam) .Marland Kitchen.. 1 every day 4)  Simvastatin 40 Mg Tabs (Simvastatin) .... One by mouth daily 5)  Metformin Hcl 500 Mg Tabs (Metformin hcl) .... Take 1 two times a day 6)  Altace 5 Mg Caps (Ramipril) .Marland Kitchen.. 1 every day 7)  Fish Oil 1200 Mg Caps (Omega-3 fatty acids) .... Take one by mouth once daily 8)  Dexilant 60 Mg Cpdr (Dexlansoprazole) .Marland Kitchen.. 1 by mouth once daily 9)  Multivitamins Tabs (Multiple vitamin) .... Daily 10)  Calcium Carbonate Powd (Calcium carbonate) .... 600mg  with v it d  daily 11)  Vitamin C 500 Mg Tabs (Ascorbic acid) .... Daily 12)  Vitamin B Complex-c Caps (B complex-c) .... Daily 13)  Zinc  .... Daily 14)  Selenium  .... Daily 15)  Penlac 8 % Soln (Ciclopirox) .... Apply to affected nails at bedtime 16)  Cetirizine Hcl 10 Mg Tabs (Cetirizine hcl) .... Take 1 by mouth once daily 17)  Tramadol Hcl 50 Mg Tabs (Tramadol hcl) .Marland Kitchen.. 1-2 by mouth four times a day as needed for arthritis pain 18)  Zolpidem Tartrate 5 Mg Tabs (Zolpidem tartrate) .... 1/2-1 tab by mouth at bedtime as needed for sleep  Patient Instructions: 1)  it was good to see you today. 2)  try combining the mobic once daily with the tramadol and tylenol every 4-6hours as needed for arthritis pain - ok to take all 3 in the same day 3)  also generic ambein to help with sleep - 4)  if continued arthritis pain  despite these medications and eating pad, you should see your orthopedist to discuss another shot in shoulder or possible surgery for pain - 5)  Please schedule a follow-up appointment in 2-6 weeks to review diabetes and other labs, sooner if problems.  Prescriptions: ZOLPIDEM TARTRATE 5 MG TABS (ZOLPIDEM TARTRATE) 1/2-1 tab by mouth at bedtime as needed for sleep  #30 x 1   Entered and Authorized by:   Newt Lukes MD   Signed by:   Vikki Ports  Edsel Petrin MD on 07/29/2009   Method used:   Print then Give to Patient   RxID:   [id number]

## 2010-05-20 NOTE — Progress Notes (Signed)
Summary: Cardiac Clearance for Breast Surgery  Phone Note From Other Clinic    Follow-up for Phone Call        Received fax from [facility] requesting clearance for breast surgery.Marland Kitchenok for clearance, low risk per Dr.Ross..form and last office note faxed to [facility] at [phone]   Follow-up by: J Cadin Luka RN

## 2010-05-20 NOTE — Progress Notes (Signed)
Summary: pt call  Phone Note Call from Patient Call back at Home Phone [phone]   Caller: Patient Summary of Call: pt called requesting a call. returned pt's call left message on machine for pt to return my call  Initial call taken by: Margaret Pyle, CMA,  January 02, 2009 10:22 AM  Follow-up for Phone Call        left message on machine for pt to return my call  Follow-up by: Margaret Pyle, CMA,  January 02, 2009 11:12 AM  Additional Follow-up for Phone Call Additional follow up Details #1::        left message on machine for pt to return my call  closing phone note until further contact form [pt Additional Follow-up by: Margaret Pyle, CMA,  January 02, 2009 3:25 PM

## 2010-05-20 NOTE — Progress Notes (Signed)
Summary: RX REFILL  Medications Added SIMVASTATIN 40 MG TABS (SIMVASTATIN) one by mouth daily RAMIPRIL 5 MG CAPS (RAMIPRIL) one by mouth daily * CERTERIZINE 10MG  one by mouth daily CARVEDILOL 6.25 MG TABS (CARVEDILOL) one by mouth two times a day GLUCOPHAGE 500 MG TABS (METFORMIN HCL) one by mouth two times a day ROZEREM 8 MG TABS (RAMELTEON) as needed ALTACE 2.5 MG CAPS (RAMIPRIL)  ALTACE 2.5 MG CAPS (RAMIPRIL)  AMLODIPINE BESYLATE 10 MG TABS (AMLODIPINE BESYLATE)  CELEBREX 200 MG CAPS (CELECOXIB)  METFORMIN HCL 500 MG TABS (METFORMIN HCL)  PREVACID 30 MG CPDR (LANSOPRAZOLE)  ZYRTEC ALLERGY 10 MG TABS (CETIRIZINE HCL)       Allergies Added: NKDA Phone Note Refill Request Call back at [phone] Message from:  Patient  Refills Requested: Medication #1:  COREG 6.25 MG BID   Supply Requested: 1 month [facility] [address] [place]   Method Requested: Fax to Local Pharmacy Initial call taken by: Burnard Leigh,  August 13, 2008 4:27 PM    New/Updated Medications: ALTACE 2.5 MG CAPS (RAMIPRIL)  ALTACE 2.5 MG CAPS (RAMIPRIL)  AMLODIPINE BESYLATE 10 MG TABS (AMLODIPINE BESYLATE)  CELEBREX 200 MG CAPS (CELECOXIB)  METFORMIN HCL 500 MG TABS (METFORMIN HCL)  PREVACID 30 MG CPDR (LANSOPRAZOLE)  ZYRTEC ALLERGY 10 MG TABS (CETIRIZINE HCL)    Prescriptions: CARVEDILOL 6.25 MG TABS (CARVEDILOL) one by mouth two times a day  #60 x 11   Entered by:   Burnett Kanaris, CMA   Authorized by:   Sherrill Raring, MD, [facility]   Signed by:   Burnett Kanaris, CMA on 08/14/2008   Method used:   Electronically to        [id number] (ret[facility])       [address]       [place], [place]  [address]       Ph: [phone] or [phone]       Fax: [phone]   RxID:   [id number]

## 2010-05-20 NOTE — Progress Notes (Signed)
Summary: dexilant denial  Phone Note From Pharmacy   Caller: [facility] [phone] Reason for Call: Medication not on formulary Summary of Call: Per pharmacy, insurance requires step therapy before covering Dexilant. Pt must have tried and failed omeprazole and nexium. Please advise  [phone] for PA   Initial call taken by: Rock Nephew CMA,  January 04, 2009 9:03 AM  Follow-up for Phone Call        may change to nexium 40mg  once daily - thanks Follow-up by: Newt Lukes MD,  January 04, 2009 2:43 PM  Additional Follow-up for Phone Call Additional follow up Details #1::        [facility] gave new rx for nexium Additional Follow-up by: Orlan Leavens,  January 04, 2009 3:45 PM    New/Updated Medications: NEXIUM 40 MG CPDR (ESOMEPRAZOLE MAGNESIUM) take 1 by mouth qd Prescriptions: NEXIUM 40 MG CPDR (ESOMEPRAZOLE MAGNESIUM) take 1 by mouth qd  #30 x 5   Entered by:   Orlan Leavens   Authorized by:   Newt Lukes MD   Signed by:   Orlan Leavens on 01/04/2009   Method used:   Telephoned to ...       [id number] (retail)       [address]       [place], [place]  [address]       Ph: [phone] or [phone]       Fax: [phone]   RxID:   [id number]

## 2010-05-20 NOTE — Assessment & Plan Note (Signed)
Summary: 2-6 WK FU--STC   Vital Signs:  Patient profile:   77 year old female Height:      59 inches (149.86 cm) Weight:      158.0 pounds (71.82 kg) O2 Sat:      95 % on Room air Temp:     98.6 degrees F (37.00 degrees C) oral Pulse rate:   62 / minute BP sitting:   130 / 72  (left arm) Cuff size:   regular  Vitals Entered By: Orlan Leavens (August 13, 2009 1:39 PM)  O2 Flow:  Room air CC: 2 week follow-up Is Patient Diabetic? Yes Did you bring your meter with you today? No Pain Assessment Patient in pain? no        Primary Care Provider:  Newt Lukes, MD  CC:  2 week follow-up.  History of Present Illness: here for followup arthritis pain - has seen ortho for "second" opinion - dr Ave Filter, prev dr. Luiz Blare  better s/p repeat steroid shot - planning MRI but still hopes to delay any surg "if i can make do without it"- pain present esp in knees and left shoulder - worse with cold weather - no swelling - still taking mobic - also tramadol if needed- participating in water aerobics believes if she could sleep, the pain would be tolerable  Current Medications (verified): 1)  Norvasc 5 Mg Tabs (Amlodipine Besylate) .Marland Kitchen.. 1 Every Day 2)  Metoprolol Tartrate 25 Mg Tabs (Metoprolol Tartrate) .... One Half A Tablet 2 Times Per Day 3)  Mobic 15 Mg Tabs (Meloxicam) .Marland Kitchen.. 1 Every Day 4)  Simvastatin 40 Mg Tabs (Simvastatin) .... One By Mouth Daily 5)  Metformin Hcl 500 Mg Tabs (Metformin Hcl) .... Take 1 Two Times A Day 6)  Altace 5 Mg Caps (Ramipril) .Marland Kitchen.. 1 Every Day 7)  Fish Oil 1200 Mg Caps (Omega-3 Fatty Acids) .... Take One By Mouth Once Daily 8)  Dexilant 60 Mg Cpdr (Dexlansoprazole) .Marland Kitchen.. 1 By Mouth Once Daily 9)  Multivitamins   Tabs (Multiple Vitamin) .... Daily 10)  Calcium Carbonate   Powd (Calcium Carbonate) .... 600mg  With V It D  Daily 11)  Vitamin C 500 Mg  Tabs (Ascorbic Acid) .... Daily 12)  Vitamin B Complex-C   Caps (B Complex-C) .... Daily 13)  Zinc  .... Daily 14)  Selenium .... Daily 15)  Penlac 8 % Soln (Ciclopirox) .... Apply To Affected Nails At Bedtime 16)  Cetirizine Hcl 10 Mg Tabs (Cetirizine Hcl) .... Take 1 By Mouth Once Daily 17)  Tramadol Hcl 50 Mg Tabs (Tramadol Hcl) .Marland Kitchen.. 1-2 By Mouth Four Times A Day As Needed For Arthritis Pain 18)  Zolpidem Tartrate 5 Mg Tabs (Zolpidem Tartrate) .... 1/2-1 Tab By Mouth At Bedtime As Needed For Sleep  Allergies (verified): No Known Drug Allergies  Past History:  Past Medical History: CORONARY ARTERY DISEASE DYSLIPIDEMIA DIABETES type 2 HYPERTENSION DIVERTICULOSIS, COLON (ICD-562.10) GERD w/ HH H. pylori gastritis treated 02/2009 ARTHRITIS gout Hemorrhoids Tubular adenoma polyp 02/2007 Fibromyalgia  MD rooster: ortho - graves (knees), chandler (shoulder)  Review of Systems  The patient denies anorexia, fever, chest pain, and headaches.    Physical Exam  General:  alert, well-developed, well-nourished, and cooperative to examination.    Lungs:  normal respiratory effort, no intercostal retractions or use of accessory muscles; normal breath sounds bilaterally - no crackles and no wheezes.    Heart:  normal rate, regular rhythm, no murmur, and no rub. BLE without edema. Psych:  Oriented X3, memory intact for recent and remote, normally interactive, good eye contact, not anxious appearing, not depressed appearing, and not agitated.      Impression & Recommendations:  Problem # 1:  ARTHRITIS (ICD-716.90) s/p eval by  ortho as prev advised to consider shoulder replacment (prior to CABG) last year delay at this time pending repeat MRI improved s/p repeat steroid shot continue max med mgmt of pain wih combo of mobic, tylenol and tramadol as well as heat and exercise reassurance provided - ?periodic steroid injection if pt remains comitted to nonop mgmt of pain (also declines rx for narc offered today)  Complete Medication List: 1)  Norvasc 5 Mg Tabs (Amlodipine besylate)  .Marland Kitchen.. 1 every day 2)  Metoprolol Tartrate 25 Mg Tabs (Metoprolol tartrate) .... One half a tablet 2 times per day 3)  Mobic 15 Mg Tabs (Meloxicam) .Marland Kitchen.. 1 every day 4)  Simvastatin 40 Mg Tabs (Simvastatin) .... One by mouth daily 5)  Metformin Hcl 500 Mg Tabs (Metformin hcl) .... Take 1 two times a day 6)  Altace 5 Mg Caps (Ramipril) .Marland Kitchen.. 1 every day 7)  Fish Oil 1200 Mg Caps (Omega-3 fatty acids) .... Take one by mouth once daily 8)  Dexilant 60 Mg Cpdr (Dexlansoprazole) .Marland Kitchen.. 1 by mouth once daily 9)  Multivitamins Tabs (Multiple vitamin) .... Daily 10)  Calcium Carbonate Powd (Calcium carbonate) .... 600mg  with v it d  daily 11)  Vitamin C 500 Mg Tabs (Ascorbic acid) .... Daily 12)  Vitamin B Complex-c Caps (B complex-c) .... Daily 13)  Zinc  .... Daily 14)  Selenium  .... Daily 15)  Penlac 8 % Soln (Ciclopirox) .... Apply to affected nails at bedtime 16)  Cetirizine Hcl 10 Mg Tabs (Cetirizine hcl) .... Take 1 by mouth once daily 17)  Tramadol Hcl 50 Mg Tabs (Tramadol hcl) .Marland Kitchen.. 1-2 by mouth four times a day as needed for arthritis pain 18)  Zolpidem Tartrate 5 Mg Tabs (Zolpidem tartrate) .... 1/2-1 tab by mouth at bedtime as needed for sleep  Patient Instructions: 1)  it was good to see you today. 2)  keep doing what you are doing - 3)  Please schedule a follow-up appointment in 2-3 months to review diabetes and oter medical problems, call sooner if problems.

## 2010-05-20 NOTE — Miscellaneous (Signed)
Summary: PT [facility]   PT [facility]   Imported By: Sherian Rein 12/18/2009 08:51:31  _____________________________________________________________________  External Attachment:    Type:   Image     Comment:   External Document

## 2010-05-20 NOTE — Letter (Signed)
Summary: Patient [name] Biopsy Results  [facility]  [address] [place], [place] [address]   Phone: [phone]  Fax: [phone]        January 28, 2009 MRN: [id number]    [facility] [address] [place], [place]  [address]    Dear [name],  I am pleased to inform you that the biopsies taken during your recent endoscopic examination did not show any evidence of cancer upon pathologic examination. The biopsies showed H. pylori gastritis. Complete the medications prescribed for treatment.  Continue with the treatment plan as outlined on the day of your      exam.  Please call us if you are having persistent problems or have questions about your condition that have not been fully answered at this time.  Sincerely,  Susan Dare MD [facility]  This letter has been electronically signed by your physician.  Appended Document: Patient Notice-Endo Biopsy Results letter sent 10.13.10.

## 2010-05-20 NOTE — Progress Notes (Signed)
Summary: lab results  Phone Note Call from Patient Call back at Home Phone [phone]   Caller: Patient Reason for Call: Lab or Test Results Initial call taken by: Judie Grieve,  February 18, 2010 4:27 PM  Follow-up for Phone Call        pt states Dr Tenny Craw decreased simvastatin adn she had labs rechecked on 10/15 wanted to make sure Dr Tenny Craw received them, gave pt results and advised I would forward results to Dr Sandrea Hammond, RN  February 18, 2010 5:05 PM

## 2010-05-20 NOTE — Assessment & Plan Note (Signed)
Summary: 6 mos f/u $50 cd   Vital Signs:  Patient profile:   77 year old female Height:      59 inches (149.86 cm) Weight:      160 pounds (72.73 kg) O2 Sat:      97 % on Room air Temp:     98.5 degrees F (36.94 degrees C) oral Pulse rate:   55 / minute BP sitting:   148 / 70  (left arm) Cuff size:   regular  Vitals Entered By: Orlan Leavens (May 27, 2009 10:10 AM)  O2 Flow:  Room air CC: 6 month follow-up Is Patient Diabetic? Yes Did you bring your meter with you today? No Pain Assessment Patient in pain? no        Primary Care Provider:  Newt Lukes, MD  CC:  6 month follow-up.  History of Present Illness: here for follow up - no med changes - tol all meds well w/o problems  Current Medications (verified): 1)  Simvastatin 40 Mg Tabs (Simvastatin) .... One By Mouth Daily 2)  Rozerem 8 Mg Tabs (Ramelteon) .... As Needed 3)  Metformin Hcl 500 Mg Tabs (Metformin Hcl) .... Take 1 Two Times A Day 4)  Multivitamins   Tabs (Multiple Vitamin) .... Daily 5)  Calcium Carbonate   Powd (Calcium Carbonate) .... 600mg  With V It D  Daily 6)  Vitamin C 500 Mg  Tabs (Ascorbic Acid) .... Daily 7)  Fish Oil 1200 Mg Caps (Omega-3 Fatty Acids) .... Take One By Mouth Once Daily 8)  Vitamin B Complex-C   Caps (B Complex-C) .... Daily 9)  Zinc .... Daily 10)  Selenium .... Daily 11)  Dexilant 60 Mg Cpdr (Dexlansoprazole) .Marland Kitchen.. 1 By Mouth Once Daily 12)  Tramadol Hcl 50 Mg Tabs (Tramadol Hcl) .Marland Kitchen.. 1-2 By Mouth Qid As Needed For Arthritis Pain 13)  Penlac 8 % Soln (Ciclopirox) .... Apply To Affected Nails At Bedtime 14)  Cetirizine Hcl 10 Mg Tabs (Cetirizine Hcl) .... Take 1 By Mouth Once Daily 15)  Altace 5 Mg Caps (Ramipril) .Marland Kitchen.. 1 Every Day 16)  Mobic 15 Mg Tabs (Meloxicam) .Marland Kitchen.. 1 Every Day 17)  Metoprolol Tartrate 25 Mg Tabs (Metoprolol Tartrate) .... One Half A Tablet 2 Times Per Day 18)  Norvasc 5 Mg Tabs (Amlodipine Besylate) .Marland Kitchen.. 1 Every Day  Allergies (verified): No  Known Drug Allergies  Past History:  Past Medical History: Reviewed history from 05/09/2009 and no changes required. CORONARY ARTERY DISEASE DYSLIPIDEMIA DIABETES type 2 HYPERTENSION DIVERTICULOSIS, COLON (ICD-562.10) GERD w/ HH H. pylori gastritis treated 02/2009 ARTHRITIS gout Hemorrhoids Tubular adenoma polyp 02/2007 Fibromyalgia  Review of Systems  The patient denies fever, weight loss, chest pain, syncope, and headaches.    Physical Exam  General:  alert, well-developed, well-nourished, and cooperative to examination.    Lungs:  normal respiratory effort, no intercostal retractions or use of accessory muscles; normal breath sounds bilaterally - no crackles and no wheezes.    Heart:  normal rate, regular rhythm, no murmur, and no rub. BLE without edema. Abdomen:  Bowel sounds positive,abdomen soft and non-tender without masses, organomegaly or hernias noted.   Impression & Recommendations:  Problem # 1:  DIABETES MELLITUS, TYPE II (ICD-250.00)  Her updated medication list for this problem includes:    Metformin Hcl 500 Mg Tabs (Metformin hcl) .Marland Kitchen... Take 1 two times a day    Altace 5 Mg Caps (Ramipril) .Marland Kitchen... 1 every day  Labs Reviewed: Creat: 0.8 (04/15/2009)  Reviewed HgBA1c results: 6.0 (10/15/2008)  Problem # 2:  HYPERTENSION (ICD-401.9)  Her updated medication list for this problem includes:    Altace 5 Mg Caps (Ramipril) .Marland Kitchen... 1 every day    Metoprolol Tartrate 25 Mg Tabs (Metoprolol tartrate) ..... One half a tablet 2 times per day    Norvasc 5 Mg Tabs (Amlodipine besylate) .Marland Kitchen... 1 every day  BP today: 148/70 Prior BP: 135/69 (05/09/2009)  Prior 10 Yr Risk Heart Disease: N/A (07/23/2008)  Labs Reviewed: K+: 4.6 (04/15/2009) Creat: : 0.8 (04/15/2009)   Chol: 206 (03/12/2009)   HDL: 94.30 (03/12/2009)   LDL: 70 (08/14/2008)   TG: 72.0 (03/12/2009)  Problem # 3:  HYPERLIPIDEMIA (ICD-272.4)  Her updated medication list for this problem includes:     Simvastatin 40 Mg Tabs (Simvastatin) ..... One by mouth daily  Labs Reviewed: SGOT: 24 (03/12/2009)     Lipid Goals: Chol Goal: 200 (07/23/2008)   HDL Goal: 40 (07/23/2008)   LDL Goal: 70 (07/23/2008)   TG Goal: 150 (07/23/2008)  Prior 10 Yr Risk Heart Disease: N/A (07/23/2008)   HDL:94.30 (03/12/2009), 61.00 (08/14/2008)  LDL:70 (08/14/2008)  Chol:206 (03/12/2009), 145 (08/14/2008)  Trig:72.0 (03/12/2009), 70.0 (08/14/2008)  Problem # 4:  DERMATOPHYTOSIS OF NAIL (ICD-110.1) Assessment: Improved  Her updated medication list for this problem includes:    Penlac 8 % Soln (Ciclopirox) .Marland Kitchen... Apply to affected nails at bedtime  Complete Medication List: 1)  Simvastatin 40 Mg Tabs (Simvastatin) .... One by mouth daily 2)  Rozerem 8 Mg Tabs (Ramelteon) .... As needed 3)  Metformin Hcl 500 Mg Tabs (Metformin hcl) .... Take 1 two times a day 4)  Multivitamins Tabs (Multiple vitamin) .... Daily 5)  Calcium Carbonate Powd (Calcium carbonate) .... 600mg  with v it d  daily 6)  Vitamin C 500 Mg Tabs (Ascorbic acid) .... Daily 7)  Fish Oil 1200 Mg Caps (Omega-3 fatty acids) .... Take one by mouth once daily 8)  Vitamin B Complex-c Caps (B complex-c) .... Daily 9)  Zinc  .... Daily 10)  Selenium  .... Daily 11)  Dexilant 60 Mg Cpdr (Dexlansoprazole) .Marland Kitchen.. 1 by mouth once daily 12)  Tramadol Hcl 50 Mg Tabs (Tramadol hcl) .Marland Kitchen.. 1-2 by mouth qid as needed for arthritis pain 13)  Penlac 8 % Soln (Ciclopirox) .... Apply to affected nails at bedtime 14)  Cetirizine Hcl 10 Mg Tabs (Cetirizine hcl) .... Take 1 by mouth once daily 15)  Altace 5 Mg Caps (Ramipril) .Marland Kitchen.. 1 every day 16)  Mobic 15 Mg Tabs (Meloxicam) .Marland Kitchen.. 1 every day 17)  Metoprolol Tartrate 25 Mg Tabs (Metoprolol tartrate) .... One half a tablet 2 times per day 18)  Norvasc 5 Mg Tabs (Amlodipine besylate) .Marland Kitchen.. 1 every day  Patient Instructions: 1)  it was good to see you today.  2)  no medication changes - keep doing what you are doing 3)   Please schedule a follow-up appointment in 4-6 months, sooner if problems.

## 2010-05-20 NOTE — Progress Notes (Signed)
Summary: NEW RX'S   Phone Note Call from Patient Call back at [facility] Phone [phone]   Caller: Patient Summary of Call: PT NEEDS NEW RX'S SENT TO [facility] FOR ALL OF HER MEDICINES FOR THE YEAR.  FAX IS [phone] Initial call taken by: Hilarie Fredrickson,  March 10, 2010 3:41 PM    Prescriptions: CARVEDILOL 6.25 MG TABS (CARVEDILOL) 1 tab two times a day  #180 x 1   Entered by:   Margaret Pyle, CMA   Authorized by:   Newt Lukes MD   Signed by:   Margaret Pyle, CMA on 03/10/2010   Method used:   Electronically to        [facility]* (mail-order)             , [place]         Ph: [phone]       Fax: [phone]   RxID:   [id number] CETIRIZINE HCL 10 MG TABS (CETIRIZINE HCL) take 1 by mouth once daily  #90 Tablet x 1   Entered by:   Margaret Pyle, CMA   Authorized by:   Newt Lukes MD   Signed by:   Margaret Pyle, CMA on 03/10/2010   Method used:   Electronically to        [facility]* (mail-order)             , [place]         Ph: [phone]       Fax: [phone]   RxID:   [id number] DEXILANT 60 MG CPDR (DEXLANSOPRAZOLE) 1 by mouth once daily  #90 x 1   Entered by:   Margaret Pyle, CMA   Authorized by:   Newt Lukes MD   Signed by:   Margaret Pyle, CMA on 03/10/2010   Method used:   Electronically to        [facility]* (mail-order)             , [place]         Ph: [phone]       Fax: [phone]   RxID:   [id number] ALTACE 5 MG CAPS (RAMIPRIL) 1 every day  #90 x 1   Entered by:   Margaret Pyle, CMA   Authorized by:   Newt Lukes MD   Signed by:   Margaret Pyle, CMA on 03/10/2010   Method used:   Electronically to        [facility]* (mail-order)             , [place]         Ph: [phone]       Fax: [phone]   RxID:   [id number] METFORMIN HCL 500 MG TABS (METFORMIN HCL) take 1 two times a day  #180 x 1   Entered by:   Margaret Pyle, CMA   Authorized by:   Newt Lukes MD   Signed by:   Margaret Pyle, CMA on 03/10/2010   Method used:   Electronically to        [facility]* (mail-order)             , [place]         Ph: [phone]       Fax: [phone]   RxID:   [id number] NORVASC 5 MG TABS (AMLODIPINE BESYLATE) 1 every day  #90 x 1   Entered by:   Margaret Pyle, CMA   Authorized by:   Newt Lukes MD  Signed by:   Margaret Pyle, CMA on 03/10/2010   Method used:   Electronically to        [facility]* (mail-order)             , [place]         Ph: [phone]       Fax: [phone]   RxID:   [id number]   Appended Document: NEW RX'S  Recent [facility] can not send electronically have to fax...03/17/10@8 :39/LMBPrescriptions: CARVEDILOL 6.25 MG TABS (CARVEDILOL) 1 tab two times a day  #180 x 1   Entered by:   Orlan Leavens RMA   Authorized by:   Newt Lukes MD   Signed by:   Orlan Leavens RMA on 03/17/2010   Method used:   Faxed to ...       [facility]* (mail-order)             , [place]         Ph: [phone]       Fax: [phone]   RxID:   [id number] CETIRIZINE HCL 10 MG TABS (CETIRIZINE HCL) take 1 by mouth once daily  #90 Tablet x 1   Entered by:   Orlan Leavens RMA   Authorized by:   Newt Lukes MD   Signed by:   Orlan Leavens RMA on 03/17/2010   Method used:   Faxed to ...       [facility]* (mail-order)             , [place]         Ph: [phone]       Fax: [phone]   RxID:   [id number] DEXILANT 60 MG CPDR (DEXLANSOPRAZOLE) 1 by mouth once daily  #90 x 1   Entered by:   Orlan Leavens RMA   Authorized by:   Newt Lukes MD   Signed by:   Orlan Leavens RMA on 03/17/2010   Method used:   Faxed to ...       [facility]* (mail-order)             , [place]         Ph: [phone]       Fax: [phone]   RxID:   [id number] ALTACE 5 MG CAPS (RAMIPRIL) 1 every day  #90 x 1   Entered by:   Orlan Leavens RMA   Authorized by:   Newt Lukes MD   Signed by:   Orlan Leavens RMA on 03/17/2010   Method used:   Faxed to ...       [facility]* (mail-order)             , [place]         Ph: [phone]       Fax: [phone]   RxID:   [id number] METFORMIN HCL 500 MG TABS (METFORMIN HCL) take 1 two times a day  #180 x 1   Entered by:   Orlan Leavens RMA   Authorized by:   Newt Lukes MD   Signed by:   Orlan Leavens RMA on 03/17/2010   Method used:   Faxed to ...       [facility]* (mail-order)             , [place]         Ph: [phone]       Fax: [phone]   RxID:   [id number] NORVASC 5 MG TABS (AMLODIPINE BESYLATE) 1 every  day  #90 x 1   Entered by:   Orlan Leavens RMA   Authorized by:   Newt Lukes MD   Signed by:   Orlan Leavens RMA on 03/17/2010   Method used:   Faxed to ...       [facility]* (mail-order)             , [place]         Ph: [phone]       Fax: [phone]   RxID:   [id number]

## 2010-05-20 NOTE — Miscellaneous (Signed)
  Clinical Lists Changes  Observations: Added new observation of CXR RESULTS:   IMPRESSION: Stable chest x-ray.  No active lung disease. (09/23/2009 9:08)      CXR  Procedure date:  09/23/2009  Findings:        IMPRESSION: Stable chest x-ray.  No active lung disease.

## 2010-05-20 NOTE — Progress Notes (Signed)
Summary: PREMEDS FOR DENTAL  Phone Note From Other Clinic Call back at [phone]   Caller: Nurse Summary of Call: PT SCHEDULED FOR CLEANING ON JUNE 30TH DOES SHE NEED PREMEDS? PLEASE Jannett Celestine [phone] Initial call taken by: Edman Circle,  September 26, 2008 10:39 AM  Follow-up for Phone Call        Will ask Dr. Tenny Craw and let dental office know.  Additional Follow-up for Phone Call Additional follow up Details #1::        Patient does not need premedication.      Appended Document: PREMEDS FOR DENTAL See note.  Appended Document: PREMEDS FOR DENTAL LM with dental office no SBE need per Dr. Tenny Craw.

## 2010-05-20 NOTE — Assessment & Plan Note (Signed)
Summary: BLOOD PRESSURE ELAVATED-LB   Vital Signs:  Patient profile:   77 year old female Height:      59 inches Weight:      156 pounds BMI:     31.62 O2 Sat:      95 % on Room air Temp:     98.1 degrees F oral Pulse rate:   57 / minute BP sitting:   150 / 74  (left arm)  O2 Flow:  Room air CC: Pt here to discuss increased blood pressure. Today was 156/82 and 161/91 when checked at home. Pt c/o headace, being dizzy and light headed./kb   Primary Care Provider:  Newt Lukes, MD  CC:  Pt here to discuss increased blood pressure. Today was 156/82 and 161/91 when checked at home. Pt c/o headace and being dizzy and light headed./kb.  History of Present Illness: 1) HA: still with HAs but not as bad as last week - occurs <1/day since last OV  CT results reviewed - symptoms relieved with tylenol no HA at this time  2) cont palpitations and fatigue- (see last OV) seems worse at night - but can occur any time of day  "fluttering" symptoms will last few minutes -  mildly tight but no CP - last episode was 10am today - only episode today - none now today was a/w HA as above - but not always occuring together  3) HTN - still high readings -  some dec with Bbloc but concernd with underlying fatigue - present even prefore being on Bbloc otherwise no adv Se related to current meds   Current Medications (verified): 1)  Simvastatin 40 Mg Tabs (Simvastatin) .... One By Mouth Daily 2)  Rozerem 8 Mg Tabs (Ramelteon) .... As Needed 3)  Metformin Hcl 500 Mg Tabs (Metformin Hcl) .... Take 1 Two Times A Day 4)  Ramipril 5 Mg Caps (Ramipril) .... Take One Capsule By Mouth Daily 5)  Multivitamins   Tabs (Multiple Vitamin) .... Daily 6)  Calcium Carbonate   Powd (Calcium Carbonate) .... 600mg  With V It D  Daily 7)  Vitamin C 500 Mg  Tabs (Ascorbic Acid) .... Daily 8)  Fish Oil 1200 Mg Caps (Omega-3 Fatty Acids) .... Take One By Mouth Once Daily 9)  Vitamin B Complex-C   Caps (B  Complex-C) .... Daily 10)  Zinc .... Daily 11)  Selenium .... Daily 12)  Dexilant 60 Mg Cpdr (Dexlansoprazole) .Marland Kitchen.. 1 By Mouth Once Daily 13)  Tramadol Hcl 50 Mg Tabs (Tramadol Hcl) .Marland Kitchen.. 1-2 By Mouth Qid As Needed For Arthritis Pain 14)  Mobic 15 Mg Tabs (Meloxicam) .... Take 1 By Mouth Qd 15)  Penlac 8 % Soln (Ciclopirox) .... Apply To Affected Nails At Bedtime 16)  Cetirizine Hcl 10 Mg Tabs (Cetirizine Hcl) .... Take 1 By Mouth Once Daily 17)  Metoprolol Tartrate 25 Mg Tabs (Metoprolol Tartrate) .... 1/2 By Mouth Two Times A Day  Allergies (verified): No Known Drug Allergies  Past History:  Past Medical History: Last updated: 03/12/2009 CORONARY ARTERY DISEASE DYSLIPIDEMIA DIABETES type 2 HYPERTENSION DIVERTICULOSIS, COLON (ICD-562.10) GERD w/ HH H. pylori gastritis treated 02/2009 ARTHRITIS gout Hemorrhoids Tubular adenoma polyp 02/2007  Review of Systems  The patient denies anorexia, vision loss, chest pain, syncope, dyspnea on exertion, peripheral edema, abdominal pain, melena, and hematochezia.    Physical Exam  General:  alert, well-developed, well-nourished, and cooperative to examination.    Lungs:  normal respiratory effort, no intercostal retractions or use of  accessory muscles; normal breath sounds bilaterally - no crackles and no wheezes.    Heart:  normal rate, regular rhythm, no murmur, and no rub. BLE without edema. Neurologic:  alert & oriented X3 and cranial nerves II-XII symetrically intact.  strength normal in all extremities, sensation intact to light touch, and gait normal. speech fluent without dysarthria or aphasia; follows commands with good comprehension.  Psych:  Oriented X3, memory intact for recent and remote, normally interactive, good eye contact, not anxious appearing, not depressed appearing, and not agitated.      Impression & Recommendations:  Problem # 1:  HEADACHE (ICD-784.0) neuro exam benign - head ct neg -  ?related to BP fluc or  palpitaions - check labs - reassurance re: neg w/u and exam -  ok to cont tylenol prn Her updated medication list for this problem includes:    Tramadol Hcl 50 Mg Tabs (Tramadol hcl) .Marland Kitchen... 1-2 by mouth qid as needed for arthritis pain    Mobic 15 Mg Tabs (Meloxicam) .Marland Kitchen... Take 1 by mouth qd    Metoprolol Tartrate 25 Mg Tabs (Metoprolol tartrate) .Marland Kitchen... 1/2 by mouth two times a day  Problem # 2:  HYPERTENSION (ICD-401.9) add another agent - and check lytes/Cr avoid inc Bbloc due to fatigue and bradycard Her updated medication list for this problem includes:    Ramipril 5 Mg Caps (Ramipril) .Marland Kitchen... Take one capsule by mouth daily    Metoprolol Tartrate 25 Mg Tabs (Metoprolol tartrate) .Marland Kitchen... 1/2 by mouth two times a day    Amlodipine Besylate 5 Mg Tabs (Amlodipine besylate) .Marland Kitchen... 1 by mouth once daily  Orders: TLB-BMP (Basic Metabolic Panel-BMET) (80048-METABOL) Prescription Created Electronically [phone]) Cardiology Referral (Cardiology)  BP today: 150/74 Prior BP: 168/80 (04/08/2009)  Prior 10 Yr Risk Heart Disease: N/A (07/23/2008)  Labs Reviewed: K+: 4.1 (03/12/2009) Creat: : 0.8 (03/12/2009)   Chol: 206 (03/12/2009)   HDL: 94.30 (03/12/2009)   LDL: 70 (08/14/2008)   TG: 72.0 (03/12/2009)  Problem # 3:  PALPITATIONS, RECURRENT (ICD-785.1) exam normal now - ?PAF or spells of RVR - consider need for holter monitor - but wil have cards eval 1st check labs r/o recurrent anemia, thyroid, lytes/dehydration problems  Her updated medication list for this problem includes:    Metoprolol Tartrate 25 Mg Tabs (Metoprolol tartrate) .Marland Kitchen... 1/2 by mouth two times a day  Orders: TLB-TSH (Thyroid Stimulating Hormone) (84443-TSH) TLB-CBC Platelet - w/Differential (85025-CBCD) TLB-BMP (Basic Metabolic Panel-BMET) (80048-METABOL) Cardiology Referral (Cardiology)  Complete Medication List: 1)  Simvastatin 40 Mg Tabs (Simvastatin) .... One by mouth daily 2)  Rozerem 8 Mg Tabs (Ramelteon) .... As  needed 3)  Metformin Hcl 500 Mg Tabs (Metformin hcl) .... Take 1 two times a day 4)  Ramipril 5 Mg Caps (Ramipril) .... Take one capsule by mouth daily 5)  Multivitamins Tabs (Multiple vitamin) .... Daily 6)  Calcium Carbonate Powd (Calcium carbonate) .... 600mg  with v it d  daily 7)  Vitamin C 500 Mg Tabs (Ascorbic acid) .... Daily 8)  Fish Oil 1200 Mg Caps (Omega-3 fatty acids) .... Take one by mouth once daily 9)  Vitamin B Complex-c Caps (B complex-c) .... Daily 10)  Zinc  .... Daily 11)  Selenium  .... Daily 12)  Dexilant 60 Mg Cpdr (Dexlansoprazole) .Marland Kitchen.. 1 by mouth once daily 13)  Tramadol Hcl 50 Mg Tabs (Tramadol hcl) .Marland Kitchen.. 1-2 by mouth qid as needed for arthritis pain 14)  Mobic 15 Mg Tabs (Meloxicam) .... Take 1 by mouth qd 15)  Penlac 8 % Soln (Ciclopirox) .... Apply to affected nails at bedtime 16)  Cetirizine Hcl 10 Mg Tabs (Cetirizine hcl) .... Take 1 by mouth once daily 17)  Metoprolol Tartrate 25 Mg Tabs (Metoprolol tartrate) .... 1/2 by mouth two times a day 18)  Amlodipine Besylate 5 Mg Tabs (Amlodipine besylate) .Marland Kitchen.. 1 by mouth once daily  Patient Instructions: 1)  will start new agent for your high blood pressure - generic Norvasc - to use in addition to your other medications 2)  your prescription has been electronically submitted to your pharmacy. Please take as directed. Contact our office if you believe you're having problems with the medication(s).  3)  we'll make referral to see dr. Tenny Craw and for consideration of heart monitor (holter). Our office will contact you regarding these appointments once made.  4)  test(s) ordered today - your results will be posted on the phone tree for review in 48-72 hours from the time of test completion; if any changes need to be made or there are abnormal results, you will be contacted directly.  5)  keep followup as scheduled Feb 2011 - call sooner if problems Prescriptions: AMLODIPINE BESYLATE 5 MG TABS (AMLODIPINE BESYLATE) 1 by  mouth once daily  #30 x 2   Entered and Authorized by:   Newt Lukes MD   Signed by:   Newt Lukes MD on 04/15/2009   Method used:   Electronically to        [facility] (retail)       [address]       [place], [place]  [address]       Ph: [phone] or [phone]       Fax: [phone]   RxID:   [id number]

## 2010-05-20 NOTE — Assessment & Plan Note (Signed)
Summary: BACK SHOULDER ALL OVER PAIN--STC   Vital Signs:  Patient profile:   77 year old female Height:      60 inches (152.40 cm) Weight:      164.6 pounds (74.82 kg) O2 Sat:      97 % on Room air Temp:     98.3 degrees F (36.83 degrees C) oral Pulse rate:   86 / minute BP sitting:   140 / 68  (left arm) Cuff size:   regular  Vitals Entered By: Orlan Leavens RMA (January 07, 2010 11:04 AM)  O2 Flow:  Room air CC: ongoing back and shoulder pain Is Patient Diabetic? No Pain Assessment Patient in pain? yes     Location: back & shoulder Type: aching Comments want flu shot   Primary Care Provider:  Newt Lukes, MD  CC:  ongoing back and shoulder pain.  History of Present Illness: c/o increase in R shoulder pain chronic condition - but worse in past 5 weeks no new injury or trauma trying water aerobics to help with left shoulder (as wishes to not undergo total shoulder replacment) and feels she has "overused and flared up" right side because of same only other joint flare - pain is in in right "sit bone" beneath buttock tried to reduce dose meloxicam - would like to resume "full strength"  also reviewed priorOV: 1) HTN - reports compliance with ongoing medical treatment and no changes in medication dose or frequency. denies adverse side effects related to current therapy.   2) dyslipidemia - reports compliance with ongoing medical treatment and no changes in medication dose or frequency. denies adverse side effects related to current therapy.   3) DM2 - reports compliance with ongoing medical treatment and no changes in medication dose or frequency. denies adverse side effects related to current therapy. no hypoglycemia but high readings after steroids use  4) diffuse, inflammatory arthritis - see above- participating in water areobics - compliant with mobic dose as rx'd  Current Medications (verified): 1)  Norvasc 5 Mg Tabs (Amlodipine Besylate) .Marland Kitchen.. 1 Every  Day 2)  Mobic 7.5 Mg Tabs (Meloxicam) .Marland Kitchen.. 1 By Mouth Once Daily 3)  Simvastatin 40 Mg Tabs (Simvastatin) .... One Half By Mouth Daily 4)  Metformin Hcl 500 Mg Tabs (Metformin Hcl) .... Take 1 Two Times A Day 5)  Altace 5 Mg Caps (Ramipril) .Marland Kitchen.. 1 Every Day 6)  Fish Oil 1200 Mg Caps (Omega-3 Fatty Acids) .... Take One By Mouth Once Daily 7)  Dexilant 60 Mg Cpdr (Dexlansoprazole) .Marland Kitchen.. 1 By Mouth Once Daily 8)  Multivitamins   Tabs (Multiple Vitamin) .... Daily 9)  Calcium Carbonate   Powd (Calcium Carbonate) .... 600mg  With V It D  Daily 10)  Vitamin C 500 Mg  Tabs (Ascorbic Acid) .... Daily 11)  Vitamin B Complex-C   Caps (B Complex-C) .... Daily 12)  Zinc .... Daily 13)  Penlac 8 % Soln (Ciclopirox) .... Apply To Affected Nails At Bedtime 14)  Cetirizine Hcl 10 Mg Tabs (Cetirizine Hcl) .... Take 1 By Mouth Once Daily 15)  Tramadol Hcl 50 Mg Tabs (Tramadol Hcl) .Marland Kitchen.. 1-2 By Mouth Four Times A Day As Needed For Arthritis Pain 16)  Zolpidem Tartrate 5 Mg Tabs (Zolpidem Tartrate) .... 1/2-1 Tab By Mouth At Bedtime As Needed For Sleep 17)  Carvedilol 6.25 Mg Tabs (Carvedilol) .Marland Kitchen.. 1 Tab Two Times A Day 18)  Robaxin 500 Mg Tabs (Methocarbamol) .Marland Kitchen.. 1 By Mouth Every 8 Hours As Needed For  Muscle Spasm  Allergies (verified): No Known Drug Allergies  Past History:  Past Medical History: CORONARY ARTERY DISEASE DYSLIPIDEMIA DIABETES type 2 HYPERTENSION DIVERTICULOSIS, COLON GERD w/ HH H. pylori gastritis treated 02/2009 ARTHRITIS -gout +OA, severe polyarthritis with chronic inflammatory changes Hemorrhoids Tubular adenoma polyp 02/2007     MD roster: card- [facility] - graves (knees), chandler (shoulder)  Review of Systems  The patient denies fever, weight loss, chest pain, and peripheral edema.    Physical Exam  General:  alert and overweight-appearing. nontoxic, very nice  Lungs:  normal respiratory effort, no intercostal retractions or use of accessory muscles; normal breath  sounds bilaterally - no crackles and no wheezes.    Heart:  normal rate, regular rhythm, no murmur, and no rub. BLE without edema. Msk:  left and right shoulder with +impingment signs and decreased ROM with lateral abduction (pain if mvmt beyond 80 degrees away from body) - mild pain to palp of shoulder over right biceps groove > left side   Impression & Recommendations:  Problem # 1:  PAIN IN JOINT, SHOULDER REGION (ICD-719.41)  adv DJD R>L shoulder followup w/ ortho (chandler) for steroid shots as planned - pt declines shoulder replacement increase meloxicam back to 15mg  (wean to 1/2 dose may have precipitated flare) also pred pack for gen antiinflam - also cont PT vicodin for severe breakthru symptoms  Her updated medication list for this problem includes:    Meloxicam 15 Mg Tabs (Meloxicam) .Marland Kitchen... 1 by mouth once daily    Tramadol Hcl 50 Mg Tabs (Tramadol hcl) .Marland Kitchen... 1-2 by mouth four times a day as needed for arthritis pain    Robaxin 500 Mg Tabs (Methocarbamol) .Marland Kitchen... 1 by mouth every 8 hours as needed for muscle spasm    Hydrocodone-acetaminophen 5-500 Mg Tabs (Hydrocodone-acetaminophen) .Marland Kitchen... 1/2-1 by mouth every 4 hours as needed for severe pain symptoms  Orders: Prescription Created Electronically [phone])  Problem # 2:  ENTHESOPATHY OF HIP REGION (ICD-726.5)  see above  Orders: Prescription Created Electronically [phone])  Complete Medication List: 1)  Norvasc 5 Mg Tabs (Amlodipine besylate) .Marland Kitchen.. 1 every day 2)  Meloxicam 15 Mg Tabs (Meloxicam) .Marland Kitchen.. 1 by mouth once daily 3)  Simvastatin 40 Mg Tabs (Simvastatin) .... One half by mouth daily 4)  Metformin Hcl 500 Mg Tabs (Metformin hcl) .... Take 1 two times a day 5)  Altace 5 Mg Caps (Ramipril) .Marland Kitchen.. 1 every day 6)  Fish Oil 1200 Mg Caps (Omega-3 fatty acids) .... Take one by mouth once daily 7)  Dexilant 60 Mg Cpdr (Dexlansoprazole) .Marland Kitchen.. 1 by mouth once daily 8)  Multivitamins Tabs (Multiple vitamin) .... Daily 9)   Calcium Carbonate Powd (Calcium carbonate) .... 600mg  with v it d  daily 10)  Vitamin C 500 Mg Tabs (Ascorbic acid) .... Daily 11)  Vitamin B Complex-c Caps (B complex-c) .... Daily 12)  Zinc  .... Daily 13)  Penlac 8 % Soln (Ciclopirox) .... Apply to affected nails at bedtime 14)  Cetirizine Hcl 10 Mg Tabs (Cetirizine hcl) .... Take 1 by mouth once daily 15)  Tramadol Hcl 50 Mg Tabs (Tramadol hcl) .Marland Kitchen.. 1-2 by mouth four times a day as needed for arthritis pain 16)  Zolpidem Tartrate 5 Mg Tabs (Zolpidem tartrate) .... 1/2-1 tab by mouth at bedtime as needed for sleep 17)  Carvedilol 6.25 Mg Tabs (Carvedilol) .Marland Kitchen.. 1 tab two times a day 18)  Robaxin 500 Mg Tabs (Methocarbamol) .Marland Kitchen.. 1 by mouth every 8 hours as needed for  muscle spasm 19)  Prednisone (pak) 10 Mg Tabs (Prednisone) .... As directed x 6 days 20)  Hydrocodone-acetaminophen 5-500 Mg Tabs (Hydrocodone-acetaminophen) .... 1/2-1 by mouth every 4 hours as needed for severe pain symptoms  Other Orders: Flu Vaccine 24yrs + MEDICARE PATIENTS (J4782) Administration Flu vaccine - MCR (N5621) Flu Vaccine Consent Questions     Do you have a history of severe allergic reactions to this vaccine? no    Any prior history of allergic reactions to egg and/or gelatin? no    Do you have a sensitivity to the preservative Thimersol? no    Do you have a past history of Guillan-Barre Syndrome? no    Do you currently have an acute febrile illness? no    Have you ever had a severe reaction to latex? no    Vaccine information given and explained to patient? yes    Are you currently pregnant? no    Lot Number:[id number]   Exp Date:10/18/2010   Site Given  Left Deltoid IM  Patient Instructions: 1)  it was good to see you today. 2)  increase dose meloxicam back to 15mg  once daily 3)  pred pack for inflammation and robaxin for muscle spasm - your prescriptions have been electronically submitted to your pharmacy. Please take as directed. Contact our office if  you believe you're having problems with the medication(s).  4)  also hydrocodone to use for severe pain symptoms as discussed - presciption given to you today 5)  continue physical therapy and followup with dr. Ave Filter as discussed 6)  Please schedule a follow-up appointment as needed. (or as previously scheduled) Prescriptions: ROBAXIN 500 MG TABS (METHOCARBAMOL) 1 by mouth every 8 hours as needed for muscle spasm  #40 x 0   Entered by:   Orlan Leavens RMA   Authorized by:   Newt Lukes MD   Signed by:   Orlan Leavens RMA on 01/07/2010   Method used:   Electronically to        [facility] (retail)       [address]       [place], [place]  [address]       Ph: [phone] or [phone]       Fax: [phone]   RxID:   [id number] MELOXICAM 15 MG TABS (MELOXICAM) 1 by mouth once daily  #90 x 3   Entered by:   Orlan Leavens RMA   Authorized by:   Newt Lukes MD   Signed by:   Orlan Leavens RMA on 01/07/2010   Method used:   Faxed to ...       [facility]* (mail-order)             , [place]         Ph: [phone]       Fax: [phone]   RxID:   [id number] HYDROCODONE-ACETAMINOPHEN 5-500 MG TABS (HYDROCODONE-ACETAMINOPHEN) 1/2-1 by mouth every 4 hours as needed for severe pain symptoms  #30 x 1   Entered and Authorized by:   Newt Lukes MD   Signed by:   Newt Lukes MD on 01/07/2010   Method used:   Print then Give to Patient   RxID:   [id number] PREDNISONE (PAK) 10 MG TABS (PREDNISONE) as directed x 6 days  #1 x 0   Entered and Authorized by:   Newt Lukes MD   Signed by:   Newt Lukes MD on 01/07/2010   Method used:   Electronically  to        [facility] (retail)       [address]       [place], [place]  [address]       Ph: [phone] or [phone]       Fax: [phone]   RxID:   [id number] MELOXICAM 15 MG TABS (MELOXICAM) 1 by mouth once daily  #90 x 3   Entered and Authorized by:   Newt Lukes MD    Signed by:   Newt Lukes MD on 01/07/2010   Method used:   Electronically to        [facility]* (mail-order)             , [place]         Ph: [phone]       Fax: [phone]   RxID:   [id number]    .lbmedflu

## 2010-05-20 NOTE — Assessment & Plan Note (Signed)
Summary: 6 WK FU  $50  STC   Vital Signs:  Patient profile:   77 year old female Height:      58 inches (147.32 cm) Weight:      154.0 pounds (70 kg) O2 Sat:      96 % Temp:     99.0 degrees F (37.22 degrees C) oral Pulse rate:   67 / minute BP sitting:   130 / 74  (left arm) Cuff size:   regular  Vitals Entered By: Orlan Leavens (November 27, 2008 10:20 AM) CC: 6 WEEK FOLLOW-UP Is Patient Diabetic? Yes  Pain Assessment Patient in pain? no        Primary Care Provider:  Newt Lukes MD  CC:  6 WEEK FOLLOW-UP.  History of Present Illness: follow up anemia - no signs or symptoms bleeding - denies BRBPR or melena no abd pain no fatigue or SOB no chest pain still taking Mobic + ASA 81 for CAD but no other NSAIDs  DM - going well with diet and exercise no problems with meds -  reports 100% compiacne with meds  HTN - 100% reported compliacne no HA, vision changes no CP or LE swelling  Current Medications (verified): 1)  Simvastatin 40 Mg Tabs (Simvastatin) .... One By Mouth Daily 2)  Certerizine 10mg  .... One By Mouth Daily 3)  Carvedilol 6.25 Mg Tabs (Carvedilol) .... One By Mouth Two Times A Day 4)  Rozerem 8 Mg Tabs (Ramelteon) .... As Needed 5)  Metformin Hcl 500 Mg Tabs (Metformin Hcl) .... Take 1 Two Times A Day 6)  Ramipril 5 Mg Caps (Ramipril) .... Take One Capsule By Mouth Daily 7)  Mobic 15 Mg Tabs (Meloxicam) .... Take As Directed 8)  Aspirin 81 Mg Tbec (Aspirin) .... Take One Tablet By Mouth Daily 9)  Multivitamins   Tabs (Multiple Vitamin) .... Daily 10)  Calcium Carbonate   Powd (Calcium Carbonate) .... 600mg  With V It D  Daily 11)  Vitamin C 500 Mg  Tabs (Ascorbic Acid) .... Daily 12)  Fish Oil   Oil (Fish Oil) .... 1200mg  Daily 13)  Vitamin B Complex-C   Caps (B Complex-C) .... Daily 14)  Zinc .... Daily 15)  Selenium .... Daily  Allergies (verified): No Known Drug Allergies  Past History:  Past Medical History: CORONARY ARTERY DISEASE  DYSLIPIDEMIA DIABETES HYPERTENSION DIVERTICULOSIS, COLON (ICD-562.10) TUBULOVILLOUS ADENOMA, COLON (ICD-211.3) GERD w/ HH FIBROMYALGIA (ICD-729.1) ARTHRITIS gout anemia NOS  Review of Systems  The patient denies anorexia, fever, weight loss, syncope, abdominal pain, melena, hematochezia, and severe indigestion/heartburn.    Physical Exam  General:  alert, well-developed, well-nourished, and cooperative to examination.    Eyes:  vision grossly intact; pupils equal, round and reactive to light.  conjunctiva and lids normal.    Ears:  R ear normal and L ear normal.  Hearing B intact, grossly normal Mouth:  teeth and gums in good repair; mucous membranes moist, without lesions or ulcers. oropharynx clear without exudate, erythema. no pallor Lungs:  normal respiratory effort, no intercostal retractions or use of accessory muscles; normal breath sounds bilaterally - no crackles and no wheezes.    Heart:  normal rate, regular rhythm, no murmur, and no rub. BLE without edema.   Impression & Recommendations:  Problem # 1:  UNSPECIFIED ANEMIA (ICD-285.9) improved Hgb last visit check - reveiwed labs asymptomatic  reck today and consider GI eval given GI hx problems if not improved  Orders: TLB-CBC Platelet - w/Differential (85025-CBCD)  Hgb: 10.6 (10/15/2008) - prev 9.2 in 6/10   Hct: 32.6 (10/15/2008)   Platelets: 213.0 (10/15/2008) RBC: 3.82 (10/15/2008)   RDW: 15.1 (10/15/2008)   WBC: 5.1 (10/15/2008) MCV: 85.3 (10/15/2008)   MCHC: 32.6 (10/15/2008) Iron: 95 (10/15/2008)   % Sat: 20.9 (10/15/2008) B12: 568 (10/15/2008)   Folate: >20.0 ng/mL (10/15/2008)     Problem # 2:  DM (ICD-250.00) well controlled - cont same tx Her updated medication list for this problem includes:    Metformin Hcl 500 Mg Tabs (Metformin hcl) .Marland Kitchen... Take 1 two times a day    Ramipril 5 Mg Caps (Ramipril) .Marland Kitchen... Take one capsule by mouth daily    Aspirin 81 Mg Tbec (Aspirin) .Marland Kitchen... Take one tablet by mouth  daily  Labs Reviewed: Creat: 0.8 (10/05/2008)    Reviewed HgBA1c results: 6.0 (10/15/2008)  Problem # 3:  HYPERTENSION (ICD-401.9) cont same Her updated medication list for this problem includes:    Carvedilol 6.25 Mg Tabs (Carvedilol) ..... One by mouth two times a day    Ramipril 5 Mg Caps (Ramipril) .Marland Kitchen... Take one capsule by mouth daily  BP today: 130/74 Prior BP: 134/62 (10/15/2008)  Prior 10 Yr Risk Heart Disease: N/A (07/23/2008)  Labs Reviewed: K+: 3.9 (10/05/2008) Creat: : 0.8 (10/05/2008)   Chol: 145 (08/14/2008)   HDL: 61.00 (08/14/2008)   LDL: 70 (08/14/2008)   TG: 70.0 (08/14/2008)  Complete Medication List: 1)  Simvastatin 40 Mg Tabs (Simvastatin) .... One by mouth daily 2)  Certerizine 10mg   .... One by mouth daily 3)  Carvedilol 6.25 Mg Tabs (Carvedilol) .... One by mouth two times a day 4)  Rozerem 8 Mg Tabs (Ramelteon) .... As needed 5)  Metformin Hcl 500 Mg Tabs (Metformin hcl) .... Take 1 two times a day 6)  Ramipril 5 Mg Caps (Ramipril) .... Take one capsule by mouth daily 7)  Mobic 15 Mg Tabs (Meloxicam) .... Take as directed 8)  Aspirin 81 Mg Tbec (Aspirin) .... Take one tablet by mouth daily 9)  Multivitamins Tabs (Multiple vitamin) .... Daily 10)  Calcium Carbonate Powd (Calcium carbonate) .... 600mg  with v it d  daily 11)  Vitamin C 500 Mg Tabs (Ascorbic acid) .... Daily 12)  Fish Oil Oil (Fish oil) .... 1200mg  daily 13)  Vitamin B Complex-c Caps (B complex-c) .... Daily 14)  Zinc  .... Daily 15)  Selenium  .... Daily  Patient Instructions: 1)  we will check your labs again to follow up in anemia - you will be notified of these results 2)  continue all medications as you are doing - no changes 3)  Please schedule a follow-up appointment in 4-6 months, sooner if there are any problems.

## 2010-05-20 NOTE — Assessment & Plan Note (Signed)
Summary: med problem/$50/cd   Vital Signs:  Patient profile:   77 year old female Height:      58 inches Weight:      150 pounds BMI:     31.46 Temp:     97.6 degrees F oral Pulse rate:   60 / minute Pulse rhythm:   regular Resp:     16 per minute BP sitting:   148 / 80  (left arm) Cuff size:   large  Vitals Entered By: Rock Nephew CMA (December 27, 2008 8:36 AM) CC: 2wk follow-up visit Is Patient Diabetic? Yes   Primary Care Provider:  Newt Lukes MD  CC:  2wk follow-up visit.  History of Present Illness: She returns with c/o pain in both shoulders and plans to have rotator cuff repair soon with Dr. Luiz Blare. She has only taken tramadol one tab. two times a day with slight relief in pain. She knows not to take nsaids. The heartburn and melena have  resolved.  Clinical Review Panels:  CBC   WBC:  4.7 (12/21/2008)   RBC:  4.30 (12/21/2008)   Hgb:  12.7 (12/21/2008)   Hct:  38.5 (12/21/2008)   Platelets:  191.0 (12/21/2008)   MCV  89.5 (12/21/2008)   MCHC  33.0 (12/21/2008)   RDW  17.2 H % (12/21/2008)   PMN:  50.1 (12/21/2008)   Lymphs:  39.9 (12/21/2008)   Monos:  7.0 (12/21/2008)   Eosinophils:  2.0 (12/21/2008)   Basophil:  1.0 (12/21/2008)   Current Medications (verified): 1)  Simvastatin 40 Mg Tabs (Simvastatin) .... One By Mouth Daily 2)  Certerizine 10mg  .... One By Mouth Daily 3)  Carvedilol 6.25 Mg Tabs (Carvedilol) .... One By Mouth Two Times A Day 4)  Rozerem 8 Mg Tabs (Ramelteon) .... As Needed 5)  Metformin Hcl 500 Mg Tabs (Metformin Hcl) .... Take 1 Two Times A Day 6)  Ramipril 5 Mg Caps (Ramipril) .... Take One Capsule By Mouth Daily 7)  Multivitamins   Tabs (Multiple Vitamin) .... Daily 8)  Calcium Carbonate   Powd (Calcium Carbonate) .... 600mg  With V It D  Daily 9)  Vitamin C 500 Mg  Tabs (Ascorbic Acid) .... Daily 10)  Fish Oil   Oil (Fish Oil) .... 1200mg  Daily 11)  Vitamin B Complex-C   Caps (B Complex-C) .... Daily 12)  Zinc ....  Daily 13)  Selenium .... Daily 14)  Dexilant 60 Mg Cpdr (Dexlansoprazole) .... Once Daily For Acid Reflux 15)  Tramadol Hcl 50 Mg Tabs (Tramadol Hcl) .Marland Kitchen.. 1-2 By Mouth Qid As Needed For Arthritis Pain  Allergies (verified): No Known Drug Allergies  Past History:  Past Medical History: Reviewed history from 11/27/2008 and no changes required. 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 CABG 05/2008 Inguinal Hernia Repair Hysterectomy (1981) Appendectomy Tubaligation Bilat knee replacements Left RCT Repair Herniated disc (x2) Ovarian cystectomy  Family History: Reviewed history from 06/09/2008 and no changes required. Mother died of stomach cancer father died of MI age 18 1 of 10 children.  one sister died in 79s of CAD, DM.  One brother died of MI age 52  Social History: Reviewed history from 09/18/2008 and no changes required. Widowed.  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   Heme:  Denies abnormal bruising, bleeding, enlarge lymph nodes, fevers, pallor, and skin discoloration.  Physical Exam  General:  alert, well-developed, well-nourished, and cooperative to examination.    Mouth:  Oral mucosa and oropharynx without lesions or exudates.  Teeth in good repair. Lungs:  Normal respiratory effort, chest expands symmetrically. Lungs are clear to auscultation, no crackles or wheezes. Heart:  Normal rate and regular rhythm. S1 and S2 normal without gallop, murmur, click, rub or other extra sounds. Abdomen:  Bowel sounds  positive,abdomen soft and non-tender without masses, organomegaly or hernias noted. Msk:  normal ROM, no joint tenderness, no joint swelling, and no joint warmth.   Extremities:  No clubbing, cyanosis, edema, or deformity noted with normal full range of motion of all joints.   Skin:  Intact without suspicious lesions or rashes Psych:  Cognition and judgment appear intact. Alert and cooperative with normal attention span and concentration. No apparent delusions, illusions, hallucinations   Impression & Recommendations:  Problem # 1:  UNSPECIFIED HEMORRHAGE OF GASTROINTESTINAL TRACT (ICD-578.9) Assessment Improved  Problem # 2:  ARTHRITIS (ICD-716.90) Assessment: Deteriorated I advised her that she can take more tramadol and she agrees to try that. she will avoid nsaids for now.  Problem # 3:  UNSPECIFIED ANEMIA (ICD-285.9) Assessment: Improved  Complete Medication List: 1)  Simvastatin 40 Mg Tabs (Simvastatin) .... One by mouth daily 2)  Certerizine 10mg   .... One by mouth daily 3)  Carvedilol 6.25 Mg Tabs (Carvedilol) .... One by mouth two times a day 4)  Rozerem 8 Mg Tabs (Ramelteon) .... As needed 5)  Metformin Hcl 500 Mg Tabs (Metformin hcl) .... Take 1 two times a day 6)  Ramipril 5 Mg Caps (Ramipril) .... Take one capsule by mouth daily 7)  Multivitamins Tabs (Multiple vitamin) .... Daily 8)  Calcium Carbonate Powd (Calcium carbonate) .... 600mg  with v it d  daily 9)  Vitamin C 500 Mg Tabs (Ascorbic acid) .... Daily 10)  Fish Oil Oil (Fish oil) .... 1200mg  daily 11)  Vitamin B Complex-c Caps (B complex-c) .... Daily 12)  Zinc  .... Daily 13)  Selenium  .... Daily 14)  Dexilant 60 Mg Cpdr (Dexlansoprazole) .... Once daily for acid reflux 15)  Tramadol Hcl 50 Mg Tabs (Tramadol hcl) .Marland Kitchen.. 1-2 by mouth qid as needed for arthritis pain  Patient Instructions: 1)  Please schedule a follow-up appointment in 2 weeks. 2)  Avoid foods high in acid (tomatoes, citrus juices, spicy  foods). Avoid eating within two hours of lying down or before exercising. Do not over eat; try smaller more frequent meals. Elevate head of bed twelve inches when sleeping. 3)  Take 650-1000mg  of Tylenol every 4-6 hours as needed for relief of pain or comfort of fever AVOID taking more than 4000mg   in a 24 hour period (can cause liver damage in higher doses).

## 2010-05-20 NOTE — Progress Notes (Signed)
Summary: pt wants sooner appt/lg  Phone Note Call from Patient Call back at Home Phone [phone]   Caller: Patient Reason for Call: Talk to Nurse, Talk to Doctor Summary of Call: pt was suppose to be seen Thursday 12/16 but we had bad weather and next avail is Febuary and pt wants to be seen prior to that...not only is she having b/p issues she is having heart palpitations Initial call taken by: Omer Jack,  April 08, 2009 8:16 AM  Follow-up for Phone Call        Called patient back and offered her an appointment with Dr.Rasheed Welty the next day she  is in the office which is 12/22 but she refused to accept  the appointment because she states she was planning on seeing Dr. Felicity Coyer today. Will let Dr.Kalli Greenfield know. Follow-up by: Suzan Garibaldi RN

## 2010-05-20 NOTE — Progress Notes (Signed)
Summary: Note request  Phone Note Call from Patient Call back at Home Phone [phone]   Summary of Call: Patient left message on triage that she needs note stating that she has been under doctors care, but is now free to return to aerobics class.  Initial call taken by: Lucious Groves,  October 16, 2009 2:09 PM  Follow-up for Phone Call        ok to generate such note for me and i will sign - thanks Follow-up by: Newt Lukes MD,  October 16, 2009 2:11 PM  Additional Follow-up for Phone Call Additional follow up Details #1::        Patient notified we will call her when note is ready for pickup. Additional Follow-up by: Lucious Groves,  October 16, 2009 2:39 PM    Additional Follow-up for Phone Call Additional follow up Details #2::    pt notified note is ready for pick up Follow-up by: Brenton Grills MA,  October 16, 2009 3:08 PM

## 2010-05-20 NOTE — Miscellaneous (Signed)
  Clinical Lists Changes  Medications: Changed medication from SIMVASTATIN 40 MG TABS (SIMVASTATIN) one by mouth daily to SIMVASTATIN 40 MG TABS (SIMVASTATIN) one half by mouth daily

## 2010-05-20 NOTE — Assessment & Plan Note (Signed)
Summary: heartburn/leschber/cd   Vital Signs:  Patient profile:   77 year old female Height:      58 inches Weight:      150 pounds BMI:     31.46 O2 Sat:      98 % on Room air Temp:     98.4 degrees F oral Pulse rate:   85 / minute Pulse rhythm:   regular Resp:     16 per minute BP sitting:   142 / 82  (left arm) Cuff size:   large  Vitals Entered By: Rock Nephew CMA (December 21, 2008 10:20 AM)  Nutrition Counseling: Patient's BMI is greater than 25 and therefore counseled on weight management options.  O2 Flow:  Room air CC: heartburn, Hypertension Management   Primary Care Provider:  Newt Lukes MD  CC:  heartburn and Hypertension Management.  History of Present Illness: New to me c/o severe heartburn for one month and 4 day hx. of melena, fatigue, epigastric burning. She stopped taking Prevacid one month ago due to the expense.  She started Mobic one month ago for arthritis. She has lost 20 pounds since her heart surgery.  Hypertension History:      She denies chest pain, palpitations, dyspnea with exertion, peripheral edema, neurologic problems, syncope, and side effects from treatment.  She notes no problems with any antihypertensive medication side effects.        Positive major cardiovascular risk factors include female age 58 years old or older, diabetes, hyperlipidemia, and hypertension.  Negative major cardiovascular risk factors include non-tobacco-user status.        Positive history for target organ damage include ASHD (either angina/prior MI/prior CABG).  Further assessment for target organ damage reveals no history of cardiac end-organ damage (CHF/LVH), stroke/TIA, peripheral vascular disease, renal insufficiency, or hypertensive retinopathy.     Allergies (verified): No Known Drug Allergies  Past History:  Past Medical History: Reviewed history from 11/27/2008 and no changes required. 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 CABG 05/2008 Inguinal Hernia Repair Hysterectomy (1981) Appendectomy Tubaligation Bilat knee replacements Left RCT Repair Herniated disc (x2) Ovarian cystectomy  Family History: Reviewed history from 06/09/2008 and no changes required. Mother died of stomach cancer father died of MI age 76 1 of 10 children.  one sister died in 11s of CAD, DM.  One brother died of MI age 83  Social History: Reviewed history from 09/18/2008 and no changes required. Widowed.  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  The patient denies fever, hemoptysis, hematochezia, hematuria, and abnormal bleeding.    Physical Exam  General:  alert, well-developed, well-nourished, and cooperative to examination.    Eyes:  pale mm, no icterus Mouth:  Oral mucosa and oropharynx without lesions or exudates.  Teeth in good repair. Neck:  No deformities, masses, or tenderness noted. Chest Wall:  sternotomy scar.   Lungs:  Normal respiratory effort, chest expands symmetrically. Lungs are clear to auscultation, no crackles or wheezes. Heart:  Normal rate and regular rhythm. S1 and S2 normal without gallop, murmur, click, rub or other extra sounds. Abdomen:  Bowel sounds positive,abdomen soft and non-tender without masses, organomegaly or  hernias noted. Rectal:  normal sphincter tone, no masses, no tenderness, no fissures, no fistulae, no perianal rash, stool positive for occult blood, and external hemorrhoid(s).   Msk:  normal ROM, no joint tenderness, no joint swelling, and no joint warmth.   Pulses:  R and L  carotid,radial,femoral,dorsalis pedis and posterior tibial pulses are full and equal bilaterally Extremities:  No clubbing, cyanosis, edema, or deformity noted with normal full range of motion of all joints.   Neurologic:  No cranial nerve deficits noted. Station and gait are normal. Plantar reflexes are down-going bilaterally. DTRs are symmetrical throughout. Sensory, motor and coordinative functions appear intact. Skin:  Intact without suspicious lesions or rashes Cervical Nodes:  No lymphadenopathy noted Psych:  Cognition and judgment appear intact. Alert and cooperative with normal attention span and concentration. No apparent delusions, illusions, hallucinations   Impression & Recommendations:  Problem # 1:  UNSPECIFIED ANEMIA (ICD-285.9) the anemia is better so I don't think she needs hospitalization at this time Orders: Venipuncture (54098) TLB-CBC Platelet - w/Differential (85025-CBCD) DRE (G0102) Hemoccult Guaiac-1 spec.(in office) (82270)  Problem # 2:  UNSPECIFIED HEMORRHAGE OF GASTROINTESTINAL TRACT (ICD-578.9) Assessment: New stop all nsaids and start dexilant, GI referral to consider EGD Orders: Gastroenterology Referral (GI) DRE (J1914) Hemoccult Guaiac-1 spec.(in office) (82270)  Problem # 3:  FIBROMYALGIA (ICD-729.1) Assessment: Deteriorated  The following medications were removed from the medication list:    Mobic 15 Mg Tabs (Meloxicam) .Marland Kitchen... Take as directed    Aspirin 81 Mg Tbec (Aspirin) .Marland Kitchen... Take one tablet by mouth daily Her updated medication list for this problem includes:    Tramadol Hcl 50 Mg Tabs (Tramadol hcl) .Marland Kitchen... 1-2 by mouth qid as needed for arthritis pain  Complete Medication List: 1)  Simvastatin 40 Mg Tabs (Simvastatin) .... One by mouth daily 2)  Certerizine 10mg   .... One by mouth daily 3)  Carvedilol 6.25 Mg Tabs (Carvedilol) .... One by mouth two times a day 4)  Rozerem 8 Mg Tabs (Ramelteon) .... As needed 5)  Metformin Hcl 500 Mg Tabs  (Metformin hcl) .... Take 1 two times a day 6)  Ramipril 5 Mg Caps (Ramipril) .... Take one capsule by mouth daily 7)  Multivitamins Tabs (Multiple vitamin) .... Daily 8)  Calcium Carbonate Powd (Calcium carbonate) .... 600mg  with v it d  daily 9)  Vitamin C 500 Mg Tabs (Ascorbic acid) .... Daily 10)  Fish Oil Oil (Fish oil) .... 1200mg  daily 11)  Vitamin B Complex-c Caps (B complex-c) .... Daily 12)  Zinc  .... Daily 13)  Selenium  .... Daily 14)  Dexilant 60 Mg Cpdr (Dexlansoprazole) .... Once daily for acid reflux 15)  Tramadol Hcl 50 Mg Tabs (Tramadol hcl) .Marland Kitchen.. 1-2 by mouth qid as needed for arthritis pain  Hypertension Assessment/Plan:      The patient's hypertensive risk group is category C: Target organ damage and/or diabetes.  Today's blood pressure is 142/82.  Her blood pressure goal is < 130/80.  Patient Instructions: 1)  Please schedule a follow-up appointment in 2 weeks. Prescriptions: TRAMADOL HCL 50 MG TABS (TRAMADOL HCL) 1-2 by mouth QID as needed for arthritis pain  #100 x 5   Entered and Authorized by:   Etta Grandchild MD   Signed by:   Etta Grandchild MD on 12/21/2008   Method used:   Print then Give to Patient   RxID:   [id number] DEXILANT 60 MG CPDR (DEXLANSOPRAZOLE) once daily for acid reflux  #50 x 0  Entered and Authorized by:   Etta Grandchild MD   Signed by:   Etta Grandchild MD on 12/21/2008   Method used:   Samples Given   RxID:   [id number]

## 2010-05-20 NOTE — Assessment & Plan Note (Signed)
Summary: headaches/cd   Vital Signs:  Patient profile:   77 year old female Height:      59 inches (149.86 cm) Weight:      155.0 pounds (70.45 kg) O2 Sat:      97 % on Room air Temp:     98.3 degrees F (36.83 degrees C) oral Pulse rate:   68 / minute BP sitting:   168 / 80  (left arm) Cuff size:   regular  Vitals Entered By: Orlan Leavens (April 08, 2009 3:30 PM)  O2 Flow:  Room air CC: Headaches & having trouble sleeping Is Patient Diabetic? Yes Did you bring your meter with you today? No Pain Assessment Patient in pain? no        Primary Care Provider:  Newt Lukes, MD  CC:  Headaches & having trouble sleeping.  History of Present Illness: here today with complaint of "not feeling well". onset of symptoms was 3 weeks ago. course has been gradual onset and now occurs in progressive worsening pattern. problem precipitated by new frontal headaches - never has HA before 3 weeks ago - now daily symptom characterized as pressure and stabbing right above her eyes -   assoc with palpitations, then light headed feeling and then neck ache to both shoulders problem associated with fatigue and high BP readings at home but not associated with fever, weakness, vison changes or CP. symptoms improved by use of tylenol "but it won;t go away for good". no prior hx of same symptoms - never has HA or palpitations before Ha at present is mild 1-2/10 and no palpitations in last 24h "can't predict when they come and go".   Current Medications (verified): 1)  Simvastatin 40 Mg Tabs (Simvastatin) .... One By Mouth Daily 2)  Rozerem 8 Mg Tabs (Ramelteon) .... As Needed 3)  Metformin Hcl 500 Mg Tabs (Metformin Hcl) .... Take 1 Two Times A Day 4)  Ramipril 5 Mg Caps (Ramipril) .... Take One Capsule By Mouth Daily 5)  Multivitamins   Tabs (Multiple Vitamin) .... Daily 6)  Calcium Carbonate   Powd (Calcium Carbonate) .... 600mg  With V It D  Daily 7)  Vitamin C 500 Mg  Tabs (Ascorbic  Acid) .... Daily 8)  Fish Oil 1200 Mg Caps (Omega-3 Fatty Acids) .... Take One By Mouth Once Daily 9)  Vitamin B Complex-C   Caps (B Complex-C) .... Daily 10)  Zinc .... Daily 11)  Selenium .... Daily 12)  Dexilant 60 Mg Cpdr (Dexlansoprazole) .Marland Kitchen.. 1 By Mouth Once Daily 13)  Tramadol Hcl 50 Mg Tabs (Tramadol Hcl) .Marland Kitchen.. 1-2 By Mouth Qid As Needed For Arthritis Pain 14)  Mobic 15 Mg Tabs (Meloxicam) .... Take 1 By Mouth Qd 15)  Penlac 8 % Soln (Ciclopirox) .... Apply To Affected Nails At Bedtime 16)  Cetirizine Hcl 10 Mg Tabs (Cetirizine Hcl) .... Take 1 By Mouth Once Daily  Allergies (verified): No Known Drug Allergies  Past History:  Past Medical History: Last updated: 03/12/2009 CORONARY ARTERY DISEASE DYSLIPIDEMIA DIABETES type 2 HYPERTENSION DIVERTICULOSIS, COLON (ICD-562.10) GERD w/ HH H. pylori gastritis treated 02/2009 ARTHRITIS gout Hemorrhoids Tubular adenoma polyp 02/2007  Review of Systems       The patient complains of headaches.  The patient denies fever, vision loss, decreased hearing, chest pain, syncope, severe indigestion/heartburn, muscle weakness, and suspicious skin lesions.    Physical Exam  General:  alert, well-developed, well-nourished, and cooperative to examination.    Eyes:  vision grossly intact; pupils  equal, round and reactive to light.  conjunctiva and lids normal.    Ears:  R ear normal and L ear normal.  Hearing B intact, grossly normal Mouth:  Oral mucosa and oropharynx without lesions or exudates.  Teeth in good repair. Lungs:  normal respiratory effort, no intercostal retractions or use of accessory muscles; normal breath sounds bilaterally - no crackles and no wheezes.    Heart:  normal rate, regular rhythm, no murmur, and no rub. BLE without edema. Neurologic:  alert & oriented X3 and cranial nerves II-XII symetrically intact.  strength normal in all extremities, sensation intact to light touch, and gait normal. speech fluent without  dysarthria or aphasia; follows commands with good comprehension.  Psych:  Oriented X3, memory intact for recent and remote, normally interactive, good eye contact, not anxious appearing, not depressed appearing, and not agitated.      Impression & Recommendations:  Problem # 1:  HEADACHE (ICD-784.0) neuro exam benign -  but given new symptoms will check head ct -  ok to cont tylenol and try low dose Bbloc as for other CV issues of concern - see below - reassurance provided  Her updated medication list for this problem includes:    Tramadol Hcl 50 Mg Tabs (Tramadol hcl) .Marland Kitchen... 1-2 by mouth qid as needed for arthritis pain    Mobic 15 Mg Tabs (Meloxicam) .Marland Kitchen... Take 1 by mouth qd    Metoprolol Tartrate 25 Mg Tabs (Metoprolol tartrate) .Marland Kitchen... 1/2 by mouth two times a day  Orders: Misc. Referral (Misc. Ref) Prescription Created Electronically [phone])  Problem # 2:  HYPERTENSION (ICD-401.9)  BP has been ranging higher than ususal -  add low dose Bbloc esp with hx poss palpitations (?tachyarrythmia - may need event/holter monitor) but watch to avoid brady cardia Time spent with patient 25 minutes, more than 50% of this time was spent counseling patient on possible causes for HA and palpitations and reasurance re: normal exam findings today as well as ongoing med eval as needed  Her updated medication list for this problem includes:    Ramipril 5 Mg Caps (Ramipril) .Marland Kitchen... Take one capsule by mouth daily    Metoprolol Tartrate 25 Mg Tabs (Metoprolol tartrate) .Marland Kitchen... 1/2 by mouth two times a day  BP today: 168/80 Prior BP: 170/80 (03/12/2009)  Prior 10 Yr Risk Heart Disease: N/A (07/23/2008)  Labs Reviewed: K+: 4.1 (03/12/2009) Creat: : 0.8 (03/12/2009)   Chol: 206 (03/12/2009)   HDL: 94.30 (03/12/2009)   LDL: 70 (08/14/2008)   TG: 72.0 (03/12/2009)  Orders: Prescription Created Electronically [phone])  Complete Medication List: 1)  Simvastatin 40 Mg Tabs (Simvastatin) .... One by mouth  daily 2)  Rozerem 8 Mg Tabs (Ramelteon) .... As needed 3)  Metformin Hcl 500 Mg Tabs (Metformin hcl) .... Take 1 two times a day 4)  Ramipril 5 Mg Caps (Ramipril) .... Take one capsule by mouth daily 5)  Multivitamins Tabs (Multiple vitamin) .... Daily 6)  Calcium Carbonate Powd (Calcium carbonate) .... 600mg  with v it d  daily 7)  Vitamin C 500 Mg Tabs (Ascorbic acid) .... Daily 8)  Fish Oil 1200 Mg Caps (Omega-3 fatty acids) .... Take one by mouth once daily 9)  Vitamin B Complex-c Caps (B complex-c) .... Daily 10)  Zinc  .... Daily 11)  Selenium  .... Daily 12)  Dexilant 60 Mg Cpdr (Dexlansoprazole) .Marland Kitchen.. 1 by mouth once daily 13)  Tramadol Hcl 50 Mg Tabs (Tramadol hcl) .Marland Kitchen.. 1-2 by mouth qid as needed  for arthritis pain 14)  Mobic 15 Mg Tabs (Meloxicam) .... Take 1 by mouth qd 15)  Penlac 8 % Soln (Ciclopirox) .... Apply to affected nails at bedtime 16)  Cetirizine Hcl 10 Mg Tabs (Cetirizine hcl) .... Take 1 by mouth once daily 17)  Metoprolol Tartrate 25 Mg Tabs (Metoprolol tartrate) .... 1/2 by mouth two times a day  Patient Instructions: 1)  will start new medication for blood pressure and heart rate (beta blocker) - hopefully this will help your symptoms  2)  we'll make referral to get head ct scan. Our office will contact you regarding this appointment once made.  3)  continue tylenol for your headaches as needed  - or if your symptoms continue to worsen (pain, fever, etc), or if you are unable take anything by mouth (pills, fluids, etc), you should go to the emergency room for further evaluation and treatment.  4)  if continued racing heart beats despite this new medication, you should call here or cardiology to arrange for a event monitor 5)  keep followup as scheduled in Feb 2011, sooner if needed Prescriptions: METOPROLOL TARTRATE 25 MG TABS (METOPROLOL TARTRATE) 1/2 by mouth two times a day  #30 x 2   Entered and Authorized by:   Newt Lukes MD   Signed by:   Newt Lukes MD on 04/08/2009   Method used:   Electronically to        [id number] (retail)       [address]       [place], [place]  [address]       Ph: [phone] or [phone]       Fax: [phone]   RxID:   [id number]

## 2010-05-20 NOTE — Procedures (Signed)
Summary: Gastroenterology EGD  Gastroenterology EGD   Imported By: Darcey Nora RN 06/24/2007 11:20:01  _____________________________________________________________________  External Attachment:    Type:   Image     Comment:   External Document

## 2010-05-20 NOTE — Progress Notes (Signed)
----   Converted from flag ---- ---- 11/29/2009 8:27 AM, Newt Lukes MD wrote: ok to see another therapist prior to that time - thanks  ---- 11/28/2009 9:59 AM, Dagoberto Reef wrote: Dr Felicity Coyer, Junious Dresser is out of office for two wks, can pt wait until 12/16/09 @ 230pm or does she need to see someone else ?  ---- 11/28/2009 9:36 AM, Newt Lukes MD wrote: The following orders have been entered for this patient and placed on Admin Hold:  Type:     Referral       Code:   PT Description:   Physical Therapy Referral Order Date:   11/28/2009   Authorized By:   Newt Lukes MD Order #:   [id number] Clinical Notes:   connie (only) at [facility] - eval and tx - shoulders, hip and buttock pain ------------------------------

## 2010-05-20 NOTE — Assessment & Plan Note (Signed)
Summary: 1 YR F/U--[facility].   History of Present Illness Visit Type: Follow-up Visit Primary GI MD: Elie Goody MD [facility] Primary Provider: Newt Lukes, MD Requesting Provider: NA Chief Complaint: Patient following up on her gerd and states that she is doing good and denies any GI complaints. History of Present Illness:   This is a return office visit for GERD. Reflux symptoms are under very good control on Dexilant. She has stable diabetes, hypertension, and coronary artery disease. She has no lower gastrointestinal complaints.   GI Review of Systems      Denies abdominal pain, acid reflux, belching, bloating, chest pain, dysphagia with liquids, dysphagia with solids, heartburn, loss of appetite, nausea, vomiting, vomiting blood, weight loss, and  weight gain.        Denies anal fissure, black tarry stools, change in bowel habit, constipation, diarrhea, diverticulosis, fecal incontinence, heme positive stool, hemorrhoids, irritable bowel syndrome, jaundice, light color stool, liver problems, rectal bleeding, and  rectal pain.   Current Medications (verified): 1)  Norvasc 5 Mg Tabs (Amlodipine Besylate) .Marland Kitchen.. 1 Every Day 2)  Meloxicam 15 Mg Tabs (Meloxicam) .Marland Kitchen.. 1 By Mouth Once Daily 3)  Simvastatin 40 Mg Tabs (Simvastatin) .... One Half By Mouth Daily 4)  Metformin Hcl 500 Mg Tabs (Metformin Hcl) .... Take 1 Two Times A Day 5)  Altace 5 Mg Caps (Ramipril) .Marland Kitchen.. 1 Every Day 6)  Pure Omega .... Take Two By Mouth Once Daily 7)  Dexilant 60 Mg Cpdr (Dexlansoprazole) .Marland Kitchen.. 1 By Mouth Once Daily 8)  Multivitamins   Tabs (Multiple Vitamin) .... Daily 9)  Calcium Carbonate   Powd (Calcium Carbonate) .... 600mg  With V It D  Daily 10)  Vitamin C 500 Mg  Tabs (Ascorbic Acid) .... Daily 11)  Vitamin B Complex-C   Caps (B Complex-C) .... Daily 12)  Zinc .... Daily 13)  Penlac 8 % Soln (Ciclopirox) .... Apply To Affected Nails At Bedtime 14)  Cetirizine Hcl 10 Mg Tabs (Cetirizine Hcl) ....  Take 1 By Mouth Once Daily 15)  Tramadol Hcl 50 Mg Tabs (Tramadol Hcl) .Marland Kitchen.. 1-2 By Mouth Four Times A Day As Needed For Arthritis Pain 16)  Zolpidem Tartrate 5 Mg Tabs (Zolpidem Tartrate) .... 1/2-1 Tab By Mouth At Bedtime As Needed For Sleep 17)  Carvedilol 6.25 Mg Tabs (Carvedilol) .Marland Kitchen.. 1 Tab Two Times A Day 18)  Robaxin 500 Mg Tabs (Methocarbamol) .Marland Kitchen.. 1 By Mouth Every 8 Hours As Needed For Muscle Spasm 19)  Hydrocodone-Acetaminophen 5-500 Mg Tabs (Hydrocodone-Acetaminophen) .... 1/2-1 By Mouth Every 4 Hours As Needed For Severe Pain Symptoms  Allergies (verified): No Known Drug Allergies  Past History:  Past Medical History: Reviewed history from 01/07/2010 and no changes required. CORONARY ARTERY DISEASE DYSLIPIDEMIA DIABETES type 2 HYPERTENSION DIVERTICULOSIS, COLON GERD w/ HH H. pylori gastritis treated 02/2009 ARTHRITIS -gout +OA, severe polyarthritis with chronic inflammatory changes Hemorrhoids Tubular adenoma polyp 02/2007     MD roster: [facility] (knees), [facility] (shoulder)  Past Surgical History: Reviewed history from 09/30/2009 and no changes required. CABG Feb 2010 Inguinal Hernia Repair Hysterectomy (1981) Appendectomy Tubaligation Bilat knee replacements Left RCT Repair Herniated disc (x2)  Ovarian cystectomy Cataract Extraction bilateral   Family History: Reviewed history from 03/12/2009 and no changes required. Mother died of stomach cancer father died of MI age 38 1 of 10 children.  one sister died in 70s of CAD, DM.  One brother died of MI age 56 No FH of Colon Cancer:  Social History: Reviewed history from 09/18/2008 and no changes required. Widowed.  Lives alone 2 sons, 2 daughters Retired Health visitor. No tobacco No ETOH Former Smoker  Review of Systems       The pertinent positives and negatives are noted as above and in the HPI. All other ROS were reviewed and were negative.   Vital Signs:  Patient profile:    77 year old female Height:      60 inches Weight:      168.2 pounds BMI:     32.97 Pulse rate:   72 / minute Pulse rhythm:   regular BP sitting:   122 / 68  (left arm) Cuff size:   regular  Vitals Entered By: Harlow Mares CMA Duncan Dull) (February 19, 2010 2:06 PM)  Physical Exam  General:  Well developed, well nourished, no acute distress. Head:  Normocephalic and atraumatic. Eyes:  PERRLA, no icterus. Ears:  Normal auditory acuity. Mouth:  No deformity or lesions, dentition normal. Lungs:  Clear throughout to auscultation. Heart:  Regular rate and rhythm; no murmurs, rubs,  or bruits. Abdomen:  Soft, nontender and nondistended. No masses, hepatosplenomegaly or hernias noted. Normal bowel sounds. Psych:  Alert and cooperative. Normal mood and affect.  Impression & Recommendations:  Problem # 1:  GERD (ICD-530.81) Continue Dexilant 60 mg q.a.m. and standard antireflux measures. Ongoing follow up with her primary physician. Return office visit with me as needed.  Problem # 2:  COLONIC POLYPS, HX OF (ICD-V12.72) Adenomatous colon polyps diagnosed in November 2008. Surveillance colonoscopy in November 2013 if her health status is appropriate.  Problem # 3:  DIVERTICULOSIS, COLON (ICD-562.10) Long-term high fiber diet with adequate water intake.  Patient Instructions: 1)  Please continue current medications.  2)  Please schedule a follow-up appointment as needed.  3)  Copy sent to : Newt Lukes, MD 4)  The medication list was reviewed and reconciled.  All changed / newly prescribed medications were explained.  A complete medication list was provided to the patient / caregiver.

## 2010-05-20 NOTE — Progress Notes (Signed)
Summary: cetirizine & metformin  Phone Note Refill Request Message from:  Fax from Pharmacy on April 03, 2009 12:32 PM  Refills Requested: Medication #1:  METFORMIN HCL 500 MG TABS take 1 two times a day   Last Refilled: 12/19/2008  Medication #2:  CERTERIZINE 10MG  one by mouth daily   Last Refilled: 03/13/2009  Method Requested: Electronic Initial call taken by: Orlan Leavens,  April 03, 2009 12:35 PM    New/Updated Medications: CETIRIZINE HCL 10 MG TABS (CETIRIZINE HCL) take 1 by mouth once daily Prescriptions: CETIRIZINE HCL 10 MG TABS (CETIRIZINE HCL) take 1 by mouth once daily  #90 x 1   Entered by:   Orlan Leavens   Authorized by:   Newt Lukes MD   Signed by:   Orlan Leavens on 04/03/2009   Method used:   Electronically to        [id number] ([facility]       [address]       [place], [place]  [address]       Ph: [phone] or [phone]       Fax: [phone]   RxID:   [id number] METFORMIN HCL 500 MG TABS (METFORMIN HCL) take 1 two times a day  #180 x 3   Entered by:   Orlan Leavens   Authorized by:   Newt Lukes MD   Signed by:   Orlan Leavens on 04/03/2009   Method used:   Electronically to        [id number] ([facility]ail)       [address]       [place], [place]  [address]       Ph: [phone] or [phone]       Fax: [phone]   RxID:   [id number]

## 2010-05-20 NOTE — Assessment & Plan Note (Signed)
Summary: STILL CONGESTED/[facility]   Vital Signs:  Patient profile:   77 year old female Height:      60 inches Weight:      160 pounds BMI:     31.36 O2 Sat:      95 % on Room air Temp:     98 degrees F oral Pulse rate:   78 / minute BP sitting:   124 / 72  (left arm) Cuff size:   regular  Vitals Entered ByZella Ball Ewing (September 23, 2009 2:31 PM)  O2 Flow:  Room air CC: Congested, cough, sore throat, chest tightness/RE   Primary Care Provider:  Newt Lukes, MD  CC:  Congested, cough, sore throat, and chest tightness/RE.  History of Present Illness: here unfortunately not significantly improved and mildly worse with breathing, wheezing and mild sob, although not functionally limiting;  still hoarse, mild  prod cough, ST, fever.  Pt denies CP, sob, wheezing, orthopnea, pnd, worsening LE edema, palps, dizziness or syncope .  Pt denies new neuro symptoms such as headache, facial or extremity weakness Daughter is psychiatrist who suggested inhaler.  Pt denies polydipsia, polyuria, or low sugar symptoms such as shakiness improved with eating.  Overall good compliance with meds, trying to follow low chol, DM diet, wt stable, little excercise however   Problems Prior to Update: 1)  Insomnia-sleep Disorder-unspec  (ICD-780.52) 2)  Bronchitis-acute  (ICD-466.0) 3)  Wheezing  (ICD-786.07) 4)  Palpitations, Recurrent  (ICD-785.1) 5)  Headache  (ICD-784.0) 6)  Gastritis  (ICD-535.50) 7)  Dry Skin  (ICD-701.1) 8)  Dermatophytosis of Nail  (ICD-110.1) 9)  Diabetes Mellitus, Type II  (ICD-250.00) 10)  Hyperlipidemia  (ICD-272.4) 11)  Hypertension  (ICD-401.9) 12)  Arthritis  (ICD-716.90) 13)  Cad, Native Vessel  (ICD-414.01) 14)  Unspecified Hemorrhage of Gastrointestinal Tract  (ICD-578.9) 15)  Fecal Occult Blood  (ICD-792.1) 16)  Unspecified Anemia  (ICD-285.9) 17)  Colonic Polyps, Hx of  (ICD-V12.72) 18)  Hiatal Hernia  (ICD-553.3) 19)  Hemorrhoids, Internal  (ICD-455.0) 20)   Diverticulosis, Colon  (ICD-562.10) 21)  Tubulovillous Adenoma, Colon  (ICD-211.3) 22)  Gerd  (ICD-530.81) 23)  Fibromyalgia  (ICD-729.1)  Medications Prior to Update: 1)  Norvasc 5 Mg Tabs (Amlodipine Besylate) .Marland Kitchen.. 1 Every Day 2)  Mobic 15 Mg Tabs (Meloxicam) .Marland Kitchen.. 1 Every Day 3)  Simvastatin 40 Mg Tabs (Simvastatin) .... One By Mouth Daily 4)  Metformin Hcl 500 Mg Tabs (Metformin Hcl) .... Take 1 Two Times A Day 5)  Altace 5 Mg Caps (Ramipril) .Marland Kitchen.. 1 Every Day 6)  Fish Oil 1200 Mg Caps (Omega-3 Fatty Acids) .... Take One By Mouth Once Daily 7)  Dexilant 60 Mg Cpdr (Dexlansoprazole) .Marland Kitchen.. 1 By Mouth Once Daily 8)  Multivitamins   Tabs (Multiple Vitamin) .... Daily 9)  Calcium Carbonate   Powd (Calcium Carbonate) .... 600mg  With V It D  Daily 10)  Vitamin C 500 Mg  Tabs (Ascorbic Acid) .... Daily 11)  Vitamin B Complex-C   Caps (B Complex-C) .... Daily 12)  Zinc .... Daily 13)  Selenium .... Daily 14)  Penlac 8 % Soln (Ciclopirox) .... Apply To Affected Nails At Bedtime 15)  Cetirizine Hcl 10 Mg Tabs (Cetirizine Hcl) .... Take 1 By Mouth Once Daily 16)  Tramadol Hcl 50 Mg Tabs (Tramadol Hcl) .Marland Kitchen.. 1-2 By Mouth Four Times A Day As Needed For Arthritis Pain 17)  Zolpidem Tartrate 5 Mg Tabs (Zolpidem Tartrate) .... 1/2-1 Tab By Mouth At Bedtime As  Needed For Sleep 18)  Carvedilol 6.25 Mg Tabs (Carvedilol) .Marland Kitchen.. 1 Tab Two Times A Day 19)  Azithromycin 250 Mg Tabs (Azithromycin) .... 2po Qd For 1 Day, Then 1po Qd For 4days, Then Stop 20)  Hydrocodone-Homatropine 5-1.5 Mg/21ml Syrp (Hydrocodone-Homatropine) .Marland Kitchen.. 1 Tsp By Mouth Q 6 Hrs As Needed 21)  Prednisone 10 Mg Tabs (Prednisone) .... 3po Qd For 3days, Then 2po Qd For 3days, Then 1po Qd For 3days, Then Stop  Current Medications (verified): 1)  Norvasc 5 Mg Tabs (Amlodipine Besylate) .Marland Kitchen.. 1 Every Day 2)  Mobic 15 Mg Tabs (Meloxicam) .Marland Kitchen.. 1 Every Day 3)  Simvastatin 40 Mg Tabs (Simvastatin) .... One By Mouth Daily 4)  Metformin Hcl 500 Mg  Tabs (Metformin Hcl) .... Take 1 Two Times A Day 5)  Altace 5 Mg Caps (Ramipril) .Marland Kitchen.. 1 Every Day 6)  Fish Oil 1200 Mg Caps (Omega-3 Fatty Acids) .... Take One By Mouth Once Daily 7)  Dexilant 60 Mg Cpdr (Dexlansoprazole) .Marland Kitchen.. 1 By Mouth Once Daily 8)  Multivitamins   Tabs (Multiple Vitamin) .... Daily 9)  Calcium Carbonate   Powd (Calcium Carbonate) .... 600mg  With V It D  Daily 10)  Vitamin C 500 Mg  Tabs (Ascorbic Acid) .... Daily 11)  Vitamin B Complex-C   Caps (B Complex-C) .... Daily 12)  Zinc .... Daily 13)  Selenium .... Daily 14)  Penlac 8 % Soln (Ciclopirox) .... Apply To Affected Nails At Bedtime 15)  Cetirizine Hcl 10 Mg Tabs (Cetirizine Hcl) .... Take 1 By Mouth Once Daily 16)  Tramadol Hcl 50 Mg Tabs (Tramadol Hcl) .Marland Kitchen.. 1-2 By Mouth Four Times A Day As Needed For Arthritis Pain 17)  Zolpidem Tartrate 5 Mg Tabs (Zolpidem Tartrate) .... 1/2-1 Tab By Mouth At Bedtime As Needed For Sleep 18)  Carvedilol 6.25 Mg Tabs (Carvedilol) .Marland Kitchen.. 1 Tab Two Times A Day 19)  Levaquin 500 Mg Tabs (Levofloxacin) .Marland Kitchen.. 1 By Mouth Once Daily 20)  Hydrocodone-Homatropine 5-1.5 Mg/53ml Syrp (Hydrocodone-Homatropine) .Marland Kitchen.. 1 Tsp By Mouth Q 6 Hrs As Needed 21)  Prednisone 10 Mg Tabs (Prednisone) .... 3po Qd For 3days, Then 2po Qd For 3days, Then 1po Qd For 3days, Then Stop 22)  Proair Hfa 108 (90 Base) Mcg/act Aers (Albuterol Sulfate) .... 2 Puffs Four Times Per Day As Needed Shortness of Breath  Allergies (verified): No Known Drug Allergies  Past History:  Past Medical History: Last updated: 08/13/2009 CORONARY ARTERY DISEASE DYSLIPIDEMIA DIABETES type 2 HYPERTENSION DIVERTICULOSIS, COLON (ICD-562.10) GERD w/ HH H. pylori gastritis treated 02/2009 ARTHRITIS gout Hemorrhoids Tubular adenoma polyp 02/2007 Fibromyalgia  MD rooster: ortho - [facility] (knees), [facility]ler (shoulder)  Past Surgical History: Last updated: 03/12/2009 CABG Feb 2010 Inguinal Hernia Repair Hysterectomy  (1981) Appendectomy Tubaligation Bilat knee replacements Left RCT Repair Herniated disc (x2) Ovarian cystectomy Cataract Extraction bilateral   Social History: Last updated: 09/18/2008 Widowed.  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  , mild ill , very nice dry wit Head:  normocephalic and atraumatic.   Eyes:  vision grossly intact, pupils equal, and pupils round.   Ears:  bilat tm's red, sinus nontender Nose:  nasal dischargemucosal pallor and mucosal edema.   Mouth:  pharyngeal erythema and fair dentition.   Neck:  supple and  no masses.   Lungs:  normal respiratory effort, R decreased breath sounds, R wheezes, L decreased breath sounds, and L wheezes.   Heart:  normal rate and regular rhythm.   Extremities:  no edema, no erythema    Impression & Recommendations:  Problem # 1:  BRONCHITIS-ACUTE (ICD-466.0)  Her updated medication list for this problem includes:    Levaquin 500 Mg Tabs (Levofloxacin) .Marland Kitchen... 1 by mouth once daily    Hydrocodone-homatropine 5-1.5 Mg/32ml Syrp (Hydrocodone-homatropine) .Marland Kitchen... 1 tsp by mouth q 6 hrs as needed    Proair Hfa 108 (90 Base) Mcg/act Aers (Albuterol sulfate) .Marland Kitchen... 2 puffs four times per day as needed shortness of breath cant r/o pna, not improved - change to levaquin, check cxr, o/w treat as above, f/u any worsening signs or symptoms   Orders: Depo- Medrol 40mg  (J1030) Depo- Medrol 80mg  (J1040) Admin of Therapeutic Inj  intramuscular or subcutaneous (16109) T-2 View CXR, Same Day (71020.5TC)  Problem # 2:  WHEEZING (ICD-786.07) ? mild worse, for repeat depomedrol today, finish the prednisone, add inhaler as needed   Problem # 3:  DIABETES MELLITUS, TYPE II (ICD-250.00)  Her updated medication list for this  problem includes:    Metformin Hcl 500 Mg Tabs (Metformin hcl) .Marland Kitchen... Take 1 two times a day    Altace 5 Mg Caps (Ramipril) .Marland Kitchen... 1 every day  Labs Reviewed: Creat: 0.8 (04/15/2009)    Reviewed HgBA1c results: 6.0 (10/15/2008) stable overall by hx and exam, ok to continue meds/tx as is  - pt to call for onset polys or cbg> 200  Complete Medication List: 1)  Norvasc 5 Mg Tabs (Amlodipine besylate) .Marland Kitchen.. 1 every day 2)  Mobic 15 Mg Tabs (Meloxicam) .Marland Kitchen.. 1 every day 3)  Simvastatin 40 Mg Tabs (Simvastatin) .... One by mouth daily 4)  Metformin Hcl 500 Mg Tabs (Metformin hcl) .... Take 1 two times a day 5)  Altace 5 Mg Caps (Ramipril) .Marland Kitchen.. 1 every day 6)  Fish Oil 1200 Mg Caps (Omega-3 fatty acids) .... Take one by mouth once daily 7)  Dexilant 60 Mg Cpdr (Dexlansoprazole) .Marland Kitchen.. 1 by mouth once daily 8)  Multivitamins Tabs (Multiple vitamin) .... Daily 9)  Calcium Carbonate Powd (Calcium carbonate) .... 600mg  with v it d  daily 10)  Vitamin C 500 Mg Tabs (Ascorbic acid) .... Daily 11)  Vitamin B Complex-c Caps (B complex-c) .... Daily 12)  Zinc  .... Daily 13)  Selenium  .... Daily 14)  Penlac 8 % Soln (Ciclopirox) .... Apply to affected nails at bedtime 15)  Cetirizine Hcl 10 Mg Tabs (Cetirizine hcl) .... Take 1 by mouth once daily 16)  Tramadol Hcl 50 Mg Tabs (Tramadol hcl) .Marland Kitchen.. 1-2 by mouth four times a day as needed for arthritis pain 17)  Zolpidem Tartrate 5 Mg Tabs (Zolpidem tartrate) .... 1/2-1 tab by mouth at bedtime as needed for sleep 18)  Carvedilol 6.25 Mg Tabs (Carvedilol) .Marland Kitchen.. 1 tab two times a day 19)  Levaquin 500 Mg Tabs (Levofloxacin) .Marland Kitchen.. 1 by mouth once daily 20)  Hydrocodone-homatropine 5-1.5 Mg/55ml Syrp (Hydrocodone-homatropine) .Marland Kitchen.. 1 tsp by mouth q 6 hrs as needed 21)  Prednisone 10 Mg Tabs (Prednisone) .... 3po qd for 3days, then 2po qd for 3days, then 1po qd for 3days, then stop 22)  Proair Hfa 108 (90 Base) Mcg/act Aers (Albuterol sulfate) .... 2 puffs four times  per day as needed shortness of breath  Patient Instructions: 1)  you had the steroid shot today 2)  Please take all new medications as prescribed - the levaquin, cough med refill, and the inhaler as needed 3)  Continue all previous medications as before this visit  - to finish the prednisone you have 4)  Please go to Radiology in the basement level for your X-Ray today  5)  please call the number on the blue card for results which should be available later today 6)  Please schedule an appointment with your primary doctor as needed Prescriptions: PROAIR HFA 108 (90 BASE) MCG/ACT AERS (ALBUTEROL SULFATE) 2 puffs four times per day as needed shortness of breath  #1 x 2   Entered and Authorized by:   Corwin Levins MD   Signed by:   Corwin Levins MD on 09/23/2009   Method used:   Print then Give to Patient   RxID:   [id number] HYDROCODONE-HOMATROPINE 5-1.5 MG/5ML SYRP (HYDROCODONE-HOMATROPINE) 1 tsp by mouth q 6 hrs as needed  #6oz x 1   Entered and Authorized by:   Corwin Levins MD   Signed by:   Corwin Levins MD on 09/23/2009   Method used:   Print then Give to Patient   RxID:   [id number] LEVAQUIN 500 MG TABS (LEVOFLOXACIN) 1 by mouth once daily  #10 x 0   Entered and Authorized by:   Corwin Levins MD   Signed by:   Corwin Levins MD on 09/23/2009   Method used:   Print then Give to Patient   RxID:   [id number]    Medication Administration  Injection # 1:    Medication: Depo- Medrol 40mg     Diagnosis: BRONCHITIS-ACUTE (ICD-466.0)    Route: IM    Site: RUOQ gluteus    Exp Date: 07/2012    Lot #: [id number]    Mfr: [employer]    Given by: Zella Ball Ewing (September 23, 2009 3:10 PM)  Injection # 2:    Medication: Depo- Medrol 80mg     Diagnosis: BRONCHITIS-ACUTE (ICD-466.0)    Route: IM    Site: RUOQ gluteus    Exp Date: 07/2012    Lot #: [id number]    Mfr: [employer]    Given by: Zella Ball Ewing (September 23, 2009 3:10 PM)  Orders Added: 1)  Depo- Medrol 40mg  [J1030] 2)  Depo- Medrol  80mg  [J1040] 3)  Admin of Therapeutic Inj  intramuscular or subcutaneous [96372] 4)  T-2 View CXR, Same Day [71020.5TC] 5)  Est. Patient Level IV [53664]

## 2010-05-20 NOTE — Assessment & Plan Note (Signed)
Summary: [facility]   Visit Type:  Follow-up Primary Provider:  Dr Corliss Skains   History of Present Illness: I saw her last she was complaining of some left chest pain, shoulder pain that was pleuriticfelt was more musculoskeletal. I asked her to contact Dr. Gavin Pound sure who follows her for her arthritis and fibromyalgia. She has not been seen since.  In the interval she says her shoulder is that, her she's complaining mainly of low back pain. She uses Darvocet.  Last Monday she began cardiac rehabilitation. She is enjoying it she denies chest pain breathing is okay actually great.  Although today she said, she would've stayed in bed. Tissue was achy. She has been walking shopping doing okay  I saw her last she was complaining of some left chest pain, shoulder pain that was pleuriticfelt was more musculoskeletal. I asked her to contact Dr. Gavin Pound sure who follows her for her arthritis and fibromyalgia. She has not been seen since.  In the interval she says her shoulder is that, her she's complaining mainly of low back pain. She uses Darvocet.  Last Monday she began cardiac rehabilitation. She is enjoying it she denies chest pain breathing is okay actually great.  Although today she said, she would've stayed in bed. Tissue was achy. She has been walking shopping doing okay     Problems Prior to Update: 1)  Cad, Native Vessel  (ICD-414.01) 2)  Hyperlipidemia  (ICD-272.4) 3)  Hiatal Hernia  (ICD-553.3) 4)  Dm  (ICD-250.00) 5)  Hypertension  (ICD-401.9) 6)  Hemorrhoids, Internal  (ICD-455.0) 7)  Diverticulosis, Colon  (ICD-562.10) 8)  Tubulovillous Adenoma, Colon  (ICD-211.3) 9)  Gerd  (ICD-530.81) 10)  Fibromyalgia  (ICD-729.1) 11)  Arthritis  (ICD-716.90)  Current Medications (verified): 1)  Simvastatin 40 Mg Tabs (Simvastatin) .... One By Mouth Daily 2)  Ramipril 5 Mg Caps (Ramipril) .... One By Mouth Daily 3)  Certerizine 10mg  .... One By Mouth Daily 4)  Carvedilol  6.25 Mg Tabs (Carvedilol) .... One By Mouth Two Times A Day 5)  Glucophage 500 Mg Tabs (Metformin Hcl) .... One By Mouth Two Times A Day 6)  Rozerem 8 Mg Tabs (Ramelteon) .... As Needed  Past History:  Past Medical History:    CORONARY ARTERY DISEASE    DYSLIPIDEMIA    DIABETES(ICD 250.00)    HYPERTENSION(ICD 401.9)    HIATAL HERNIA (ICD-553.3)    HEMORRHOIDS, INTERNAL (ICD-455.0)    DIVERTICULOSIS, COLON (ICD-562.10)    TUBULOVILLOUS ADENOMA, COLON (ICD-211.3)    GERD (ICD-530.81)    FIBROMYALGIA (ICD-729.1)    ARTHRITIS (ICD-716.90)     (06-13-08)  Family History:    Mother died of stomach cancer    father died of MI age 76    1 of 10 children.  one sister died in 63s of CAD, DM.  One brother died of MI age 51 (June 13, 2008)  Risk Factors:    Alcohol Use: N/A    >5 drinks/d w/in last 3 months: N/A    Caffeine Use: N/A    Diet: N/A    Exercise: N/A  Family History:    Reviewed history from Jun 13, 2008 and no changes required:       Mother died of stomach cancer       father died of MI age 64       1 of 10 children.  one sister died in 7s of CAD, DM.  One brother died of MI age 57  Social History:    Reviewed history  from 06/09/2008 and no changes required:       Widowed.  Lives alone       2 sons, 2 daughters       Retired Health visitor.       No tobacco       No ETOH  Vital Signs:  Patient profile:   77 year old female Height:      58 inches Weight:      159 pounds BMI:     33.35 Pulse rate:   79 / minute Resp:     18 per minute BP sitting:   118 / 75  (right arm)  Vitals Entered By: Marrion Coy, CNA (July 23, 2008 11:51 AM)  Physical Exam  General:  Well developed, well nourished, in no acute distress. Head:  normocephalic and atraumatic Mouth:  Teeth, gums and palate normal. Oral mucosa normal. Neck:  Neck supple, no JVD. No masses, thyromegaly or abnormal cervical nodes. Lungs:  Clear bilaterally to auscultation and percussion. Heart:   Non-displaced PMI, chest non-tender; regular rate and rhythm, S1, S2 without murmurs, rubs or gallops. Carotid upstroke normal, no bruit. Normal abdominal aortic size, no bruits. Femorals normal pulses, no bruits. Pedals normal pulses. No edema, no varicosities. Abdomen:  Bowel sounds positive; abdomen soft and non-tender without masses, organomegaly, or hernias noted. No hepatosplenomegaly. Pulses:  pulses normal in all 4 extremities Extremities:  No clubbing or cyanosis.   Impression & Recommendations:  Problem # 1:  CAD, NATIVE VESSEL (ICD-414.01) 2 months s/p CABG. Doing well. Continue meds. Continue rehab Her updated medication list for this problem includes:    Ramipril 5 Mg Caps (Ramipril) ..... One by mouth daily    Carvedilol 6.25 Mg Tabs (Carvedilol) ..... One by mouth two times a day  Problem # 2:  HYPERLIPIDEMIA (ICD-272.4) Will set up for fasting lipids, AST. Her updated medication list for this problem includes:    Simvastatin 40 Mg Tabs (Simvastatin) ..... One by mouth daily  Problem # 3:  HYPERTENSION (ICD-401.9) Good control Her updated medication list for this problem includes:    Ramipril 5 Mg Caps (Ramipril) ..... One by mouth daily    Carvedilol 6.25 Mg Tabs (Carvedilol) ..... One by mouth two times a day  Problem # 4:  FIBROMYALGIA (ICD-729.1) F/U Dr. Corliss Skains this week.  Patient Instructions: 1)  Continue current regimen. 2)  F/U August. 3)  Fasting labs.

## 2010-05-22 NOTE — Progress Notes (Signed)
Summary: question on meds  Phone Note Call from Patient Call back at Home Phone [phone]   Caller: Patient Reason for Call: Talk to Nurse Summary of Call: question on meds Initial call taken by: Roe Coombs,  March 31, 2010 4:18 PM  Follow-up for Phone Call        pt has not gotten prescription for lipitor yet--pt will stop simvastatin and will start Lipitor 40mg  daily(see lab append dated 02/22/10)--she will return for fasting lipid profile/AST in  8 weeks-05/28/10--pt requested prescription locally--    Prescriptions: LIPITOR 40 MG TABS (ATORVASTATIN CALCIUM) 1 tab at bedtime  FILL WITH GENERIC  #30 x 3   Entered by:   Katina Dung, RN, BSN   Authorized by:   Sherrill Raring, MD, [facility]   Signed by:   Katina Dung, RN, BSN on 03/31/2010   Method used:   Electronically to        [id number] (retail)       [address]       [place], [place]  [address]       Ph: [phone] or [phone]       Fax: [phone]   RxID:   [id number]    Current Medications (verified): 1)  Norvasc 5 Mg Tabs (Amlodipine Besylate) .Marland Kitchen.. 1 Every Day 2)  Meloxicam 15 Mg Tabs (Meloxicam) .Marland Kitchen.. 1 By Mouth Once Daily 3)  Metformin Hcl 500 Mg Tabs (Metformin Hcl) .... Take 1 Two Times A Day 4)  Altace 5 Mg Caps (Ramipril) .Marland Kitchen.. 1 Every Day 5)  Pure Omega .... Take Two By Mouth Once Daily 6)  Dexilant 60 Mg Cpdr (Dexlansoprazole) .Marland Kitchen.. 1 By Mouth Once Daily 7)  Multivitamins   Tabs (Multiple Vitamin) .... Daily 8)  Calcium Carbonate   Powd (Calcium Carbonate) .... 600mg  With V It D  Daily 9)  Vitamin C 500 Mg  Tabs (Ascorbic Acid) .... Daily 10)  Vitamin B Complex-C   Caps (B Complex-C) .... Daily 11)  Zinc .... Daily 12)  Penlac 8 % Soln (Ciclopirox) .... Apply To Affected Nails At Bedtime 13)  Cetirizine Hcl 10 Mg Tabs (Cetirizine Hcl) .... Take 1 By Mouth Once Daily 14)  Tramadol Hcl 50 Mg Tabs (Tramadol Hcl) .Marland Kitchen.. 1-2 By Mouth Four Times A Day As Needed For Arthritis Pain 15)   Zolpidem Tartrate 5 Mg Tabs (Zolpidem Tartrate) .... 1/2-1 Tab By Mouth At Bedtime As Needed For Sleep 16)  Carvedilol 6.25 Mg Tabs (Carvedilol) .Marland Kitchen.. 1 Tab Two Times A Day 17)  Robaxin 500 Mg Tabs (Methocarbamol) .Marland Kitchen.. 1 By Mouth Every 8 Hours As Needed For Muscle Spasm 18)  Hydrocodone-Acetaminophen 5-500 Mg Tabs (Hydrocodone-Acetaminophen) .... 1/2-1 By Mouth Every 4 Hours As Needed For Severe Pain Symptoms 19)  Lipitor 40 Mg Tabs (Atorvastatin Calcium) .Marland Kitchen.. 1 Tab At Bedtime  Fill With Generic 20)  Voltaren 1 % Gel (Diclofenac Sodium) .... Apply To Affecetd Joint Four Times A Day As Needed For Pain Symptoms  Allergies: No Known Drug Allergies

## 2010-05-22 NOTE — Consult Note (Signed)
Summary: [facility]   Imported By: Sherian Rein 04/24/2010 12:33:38  _____________________________________________________________________  External Attachment:    Type:   Image     Comment:   External Document

## 2010-05-22 NOTE — Letter (Signed)
Summary: [employer]ability [facility]ac[employer]  Disability Placard   Imported By: L[employer] 04/17/2010 10:42:58  _____________________________________________________________________  External Attachment:    Type:   Image     Comment:   External Document

## 2010-05-28 ENCOUNTER — Other Ambulatory Visit: Payer: Self-pay

## 2010-05-28 NOTE — Letter (Signed)
Summary: [facility]   Imported By: Lennie Odor 05/20/2010 15:15:20  _____________________________________________________________________  External Attachment:    Type:   Image     Comment:   External Document

## 2010-06-04 ENCOUNTER — Other Ambulatory Visit: Payer: Medicare Other

## 2010-06-04 ENCOUNTER — Other Ambulatory Visit: Payer: Self-pay | Admitting: Internal Medicine

## 2010-06-04 ENCOUNTER — Encounter (INDEPENDENT_AMBULATORY_CARE_PROVIDER_SITE_OTHER): Payer: Self-pay | Admitting: *Deleted

## 2010-06-04 ENCOUNTER — Encounter: Payer: Self-pay | Admitting: Internal Medicine

## 2010-06-04 DIAGNOSIS — E7800 Pure hypercholesterolemia, unspecified: Secondary | ICD-10-CM

## 2010-06-04 LAB — LIPID PANEL
Cholesterol: 168 mg/dL (ref 0–200)
HDL: 84.7 mg/dL (ref >39.00)
LDL Cholesterol: 70 mg/dL (ref 0–99)
Total CHOL/HDL Ratio: 2
Triglycerides: 69 mg/dL (ref 0.0–149.0)
VLDL: 13.8 mg/dL (ref 0.0–40.0)

## 2010-06-04 LAB — AST: AST: 24 U/L (ref 0–37)

## 2010-06-09 ENCOUNTER — Telehealth: Payer: Self-pay | Admitting: Internal Medicine

## 2010-06-17 NOTE — Progress Notes (Signed)
Summary: pt rtn call to get lab results  Phone Note Call from Patient Call back at Home Phone [phone]   Caller: Patient Reason for Call: Talk to Nurse, Talk to Doctor, Lab or Test Results Summary of Call: pt rtn call from last week to get lab results Initial call taken by: Omer Jack,  June 09, 2010 11:09 AM  Follow-up for Phone Call        Called patient with lab results.  Layne Benton, RN, BSN  June 09, 2010 11:21 AM     Prescriptions: LIPITOR 40 MG TABS (ATORVASTATIN CALCIUM) 1 tab at bedtime  FILL WITH GENERIC  #90 x 3   Entered by:   Layne Benton, RN, BSN   Authorized by:   Sherrill Raring, MD, [facility]   Signed by:   Layne Benton, RN, BSN on 06/09/2010   Method used:   Faxed to ...       [facility])             , [place]         Ph: [phone]       Fax: [phone]   RxID:   [id number]

## 2010-06-17 NOTE — Medication Information (Signed)
Summary: [facility]   Imported By: Lester Greenb[employer] 06/09/2010 10:05:36  _____________________________________________________________________  External Attachment:    Type:   Image     Comment:   External Document

## 2010-06-24 ENCOUNTER — Telehealth: Payer: Self-pay | Admitting: Internal Medicine

## 2010-06-24 ENCOUNTER — Ambulatory Visit (INDEPENDENT_AMBULATORY_CARE_PROVIDER_SITE_OTHER): Payer: Medicare Other | Admitting: Internal Medicine

## 2010-06-24 ENCOUNTER — Encounter: Payer: Self-pay | Admitting: Internal Medicine

## 2010-06-24 DIAGNOSIS — E119 Type 2 diabetes mellitus without complications: Secondary | ICD-10-CM

## 2010-06-24 DIAGNOSIS — E785 Hyperlipidemia, unspecified: Secondary | ICD-10-CM

## 2010-06-24 DIAGNOSIS — M25519 Pain in unspecified shoulder: Secondary | ICD-10-CM

## 2010-07-01 NOTE — Assessment & Plan Note (Signed)
Summary: 3 MTH FU--STC   Vital Signs:  Patient profile:   77 year old female Weight:      164.4 pounds (74.73 kg) O2 Sat:      97 % Temp:     98.6 degrees F (37.00 degrees C) oral Pulse rate:   71 / minute BP sitting:   128 / 62  (left arm) Cuff size:   regular  Vitals Entered By: Orlan Leavens RMA (June 24, 2010 10:50 AM) CC: 3 month follow-up Is Patient Diabetic? Yes Did you bring your meter with you today? No Pain Assessment Patient in pain? no        Primary Care Provider:  Newt Lukes, MD  CC:  3 month follow-up.  History of Present Illness: here for f/u  1) HTN - reports compliance with ongoing medical treatment and no changes in medication dose or frequency. denies adverse side effects related to current therapy.   2) dyslipidemia - reports compliance with ongoing medical treatment and no changes in medication dose or frequency. denies adverse side effects related to current therapy.   3) DM2 - reports compliance with ongoing medical treatment and no changes in medication dose or frequency. denies adverse side effects related to current therapy. no hypoglycemia but high readings after steroids use  4) diffuse, severe arthritis both inflammatory and DJD, dx never clarified in past - follows with rheum - denies any dx or tx of RA or lupus or gout - participating in water areobics - compliant with mobic dose as rx'd - but still with constant pain and mild swelling - hands, wrists, knees and neck - pain also complicated by DJD with cont severe pain in both shoulders. trying water aerobics to help with pain but not (as wishes to not undergo total shoulder replacment as rec by ortho) tried to reduce dose meloxicam  but must take "full strength" - uses hydrocodone sparingly  Clinical Review Panels:  Prevention   Last Mammogram:  ASSESSMENT: Negative - BI-RADS 1^MM DIGITAL SCREENING (01/17/2010)   Last Pap Smear:  Normal (04/20/2002)   Last Colonoscopy:  Location:   [facility].  Results: Polyp.  Results: Hemorrhoids.     Results: Diverticulosis.        (03/02/2007)  Lipid Management   Cholesterol:  168 (06/04/2010)   LDL (bad choesterol):  70 (06/04/2010)   HDL (good cholesterol):  84.70 (06/04/2010)  Diabetes Management   HgBA1C:  6.0 (03/25/2010)   Creatinine:  0.8 (04/15/2009)   Last Flu Vaccine:  Fluvax 3+ (01/07/2010)  CBC   WBC:  5.8 (04/15/2009)   RBC:  4.09 (04/15/2009)   Hgb:  12.8 (04/15/2009)   Hct:  38.4 (04/15/2009)   Platelets:  174.0 (04/15/2009)   MCV  93.9 (04/15/2009)   MCHC  33.2 (04/15/2009)   RDW  14.5 (04/15/2009)   PMN:  49.0 (04/15/2009)   Lymphs:  44.3 (04/15/2009)   Monos:  5.1 (04/15/2009)   Eosinophils:  1.3 (04/15/2009)   Basophil:  0.3 (04/15/2009)  Complete Metabolic Panel   Glucose:  78 (04/15/2009)   Sodium:  139 (04/15/2009)   Potassium:  4.6 (04/15/2009)   Chloride:  104 (04/15/2009)   CO2:  27 (04/15/2009)   BUN:  14 (04/15/2009)   Creatinine:  0.8 (04/15/2009)   Albumin:  3.6 (09/30/2009)   Total Protein:  6.1 (09/30/2009)   Calcium:  9.5 (04/15/2009)   Total Bili:  0.5 (09/30/2009)   Alk Phos:  49 (09/30/2009)   SGPT (ALT):  21 (09/30/2009)   SGOT (AST):  24 (06/04/2010)   Current Medications (verified): 1)  Norvasc 5 Mg Tabs (Amlodipine Besylate) .Marland Kitchen.. 1 Every Day 2)  Metformin Hcl 500 Mg Tabs (Metformin Hcl) .... Take 1 Two Times A Day 3)  Altace 5 Mg Caps (Ramipril) .Marland Kitchen.. 1 Every Day 4)  Pure Omega .... Take Two By Mouth Once Daily 5)  Dexilant 60 Mg Cpdr (Dexlansoprazole) .Marland Kitchen.. 1 By Mouth Once Daily 6)  Multivitamins   Tabs (Multiple Vitamin) .... Daily 7)  Calcium Carbonate   Powd (Calcium Carbonate) .... 600mg  With V It D  Daily 8)  Vitamin C 500 Mg  Tabs (Ascorbic Acid) .... Daily 9)  Vitamin B Complex-C   Caps (B Complex-C) .... Daily 10)  Zinc .... Daily 11)  Penlac 8 % Soln (Ciclopirox) .... Apply To Affected Nails At Bedtime 12)  Cetirizine Hcl 10 Mg Tabs  (Cetirizine Hcl) .... Take 1 By Mouth Once Daily 13)  Tramadol Hcl 50 Mg Tabs (Tramadol Hcl) .Marland Kitchen.. 1-2 By Mouth Four Times A Day As Needed For Arthritis Pain 14)  Zolpidem Tartrate 5 Mg Tabs (Zolpidem Tartrate) .... 1/2-1 Tab By Mouth At Bedtime As Needed For Sleep 15)  Carvedilol 6.25 Mg Tabs (Carvedilol) .Marland Kitchen.. 1 Tab Two Times A Day 16)  Robaxin 500 Mg Tabs (Methocarbamol) .Marland Kitchen.. 1 By Mouth Every 8 Hours As Needed For Muscle Spasm 17)  Hydrocodone-Acetaminophen 5-500 Mg Tabs (Hydrocodone-Acetaminophen) .... 1/2-1 By Mouth Every 4 Hours As Needed For Severe Pain Symptoms 18)  Lipitor 40 Mg Tabs (Atorvastatin Calcium) .Marland Kitchen.. 1 Tab At Bedtime  Fill With Generic 19)  Voltaren 1 % Gel (Diclofenac Sodium) .... Apply To Affecetd Joint Four Times A Day As Needed For Pain Symptoms  Allergies (verified): No Known Drug Allergies  Past History:  Past Medical History: CORONARY ARTERY DISEASE DYSLIPIDEMIA DIABETES type 2 HYPERTENSION DIVERTICULOSIS, COLON GERD w/ HH H. pylori gastritis treated 02/2009 ARTHRITIS -gout +OA, severe polyarthritis with chronic inflammatory changes Hemorrhoids Tubular adenoma polyp 02/2007     MD roster: card- [facility] - graves (knees), chandler (shoulder) GI - stark rheum -anderson  Review of Systems  The patient denies weight gain, chest pain, syncope, and peripheral edema.    Physical Exam  General:  alert and overweight-appearing. nontoxic, very nice  Lungs:  normal respiratory effort, no intercostal retractions or use of accessory muscles; normal breath sounds bilaterally - no crackles and no wheezes.    Heart:  normal rate, regular rhythm, no murmur, and no rub. BLE without edema.   Impression & Recommendations:  Problem # 1:  DIABETES MELLITUS, TYPE II (ICD-250.00)  Her updated medication list for this problem includes:    Metformin Hcl 500 Mg Tabs (Metformin hcl) .Marland Kitchen... Take 1 two times a day    Altace 5 Mg Caps (Ramipril) .Marland Kitchen... 1 every day  does  not check cbgs regularly - weight gain noted and multiple steroid injections for shoulders in past 6 months  Labs Reviewed: Creat: 0.8 (04/15/2009)    Reviewed HgBA1c results: 6.0 (03/25/2010)  6.2 (09/30/2009)  Problem # 2:  PAIN IN JOINT, SHOULDER REGION (ICD-719.41)  The following medications were removed from the medication list:    Meloxicam 15 Mg Tabs (Meloxicam) .Marland Kitchen... 1 by mouth once daily Her updated medication list for this problem includes:    Tramadol Hcl 50 Mg Tabs (Tramadol hcl) .Marland Kitchen... 1-2 by mouth four times a day as needed for arthritis pain    Robaxin 500 Mg Tabs (Methocarbamol) .Marland KitchenMarland KitchenMarland KitchenMarland Kitchen  1 by mouth every 8 hours as needed for muscle spasm    Hydrocodone-acetaminophen 5-500 Mg Tabs (Hydrocodone-acetaminophen) .Marland Kitchen... 1/2-1 by mouth every 4 hours as needed for severe pain symptoms  adv DJD R>L shoulder followup w/ ortho (chandler) for steroid shots as planned - pt declines shoulder replacement conte meloxicam 15mg  (wean to 1/2 dose may have precipitated flare) also cont PT and rheum vicodin for severe breakthru symptoms   Problem # 3:  HYPERLIPIDEMIA (ICD-272.4)  Her updated medication list for this problem includes:    Lipitor 40 Mg Tabs (Atorvastatin calcium) .Marland Kitchen... 1 tab at bedtime  fill with generic  Labs Reviewed: SGOT: 24 (06/04/2010)   SGPT: 21 (09/30/2009)  Lipid Goals: Chol Goal: 200 (07/23/2008)   HDL Goal: 40 (07/23/2008)   LDL Goal: 70 (07/23/2008)   TG Goal: 150 (07/23/2008)  Prior 10 Yr Risk Heart Disease: N/A (07/23/2008)   HDL:84.70 (06/04/2010), 93 (02/01/2010)  LDL:70 (06/04/2010), 102 (04/54/0981)  Chol:168 (06/04/2010), 218 (02/01/2010)  Trig:69.0 (06/04/2010), 117 (02/01/2010)  Complete Medication List: 1)  Norvasc 5 Mg Tabs (Amlodipine besylate) .Marland Kitchen.. 1 every day 2)  Metformin Hcl 500 Mg Tabs (Metformin hcl) .... Take 1 two times a day 3)  Altace 5 Mg Caps (Ramipril) .Marland Kitchen.. 1 every day 4)  Pure Omega  .... Take two by mouth once daily 5)  Dexilant  60 Mg Cpdr (Dexlansoprazole) .Marland Kitchen.. 1 by mouth once daily 6)  Multivitamins Tabs (Multiple vitamin) .... Daily 7)  Calcium Carbonate Powd (Calcium carbonate) .... 600mg  with v it d  daily 8)  Vitamin C 500 Mg Tabs (Ascorbic acid) .... Daily 9)  Vitamin B Complex-c Caps (B complex-c) .... Daily 10)  Zinc  .... Daily 11)  Penlac 8 % Soln (Ciclopirox) .... Apply to affected nails at bedtime 12)  Cetirizine Hcl 10 Mg Tabs (Cetirizine hcl) .... Take 1 by mouth once daily 13)  Tramadol Hcl 50 Mg Tabs (Tramadol hcl) .Marland Kitchen.. 1-2 by mouth four times a day as needed for arthritis pain 14)  Zolpidem Tartrate 5 Mg Tabs (Zolpidem tartrate) .... 1/2-1 tab by mouth at bedtime as needed for sleep 15)  Carvedilol 6.25 Mg Tabs (Carvedilol) .Marland Kitchen.. 1 tab two times a day 16)  Robaxin 500 Mg Tabs (Methocarbamol) .Marland Kitchen.. 1 by mouth every 8 hours as needed for muscle spasm 17)  Hydrocodone-acetaminophen 5-500 Mg Tabs (Hydrocodone-acetaminophen) .... 1/2-1 by mouth every 4 hours as needed for severe pain symptoms 18)  Lipitor 40 Mg Tabs (Atorvastatin calcium) .Marland Kitchen.. 1 tab at bedtime  fill with generic 19)  Voltaren 1 % Gel (Diclofenac sodium) .... Apply to affecetd joint four times a day as needed for pain symptoms 20)  Freestyle Lite Test Strp (Glucose blood) .... Use as directed two times a day  Patient Instructions: 1)  it was good to see you today. 2)  medical history and medicines reviewed - no changes by me today 3)  let your diabetes supply company know that we are happy to refill your supplies as needed  4)  Please schedule a follow-up appointment in 3-4 months to check labs for diabetes, call sooner if problems.    Orders Added: 1)  Est. Patient Level III [19147]

## 2010-07-01 NOTE — Progress Notes (Signed)
Summary: FYI  Phone Note Call from Patient Call back at [facility] Phone [phone]   Caller: Patient Details for Reason: FYI Summary of Call: Pt called back to let you know that her monitor that she needs strips for is Free Style Lite. Initial call taken by: Burnard [facility]),  June 24, 2010 12:57 PM    New/Updated Medications: FREESTYLE LITE TEST  STRP (GLUCOSE BLOOD) use as directed two times a day Prescriptions: FREESTYLE LITE TEST  STRP (GLUCOSE BLOOD) use as directed two times a day  #200 x 3   Entered by:   Margaret Pyle, CMA   Authorized by:   Newt Lukes MD   Signed by:   Margaret Pyle, CMA on 06/24/2010   Method used:   Electronically to        [id number] (retail)       [address]       [place], [place]  [address]       Ph: [phone] or [phone]       Fax: [phone]   RxID:   [id number]

## 2010-07-10 ENCOUNTER — Ambulatory Visit: Payer: Medicare Other | Attending: Rheumatology | Admitting: Rehabilitative and Restorative Service Providers"

## 2010-07-10 DIAGNOSIS — M25519 Pain in unspecified shoulder: Secondary | ICD-10-CM | POA: Insufficient documentation

## 2010-07-10 DIAGNOSIS — M542 Cervicalgia: Secondary | ICD-10-CM | POA: Insufficient documentation

## 2010-07-10 DIAGNOSIS — IMO0001 Reserved for inherently not codable concepts without codable children: Secondary | ICD-10-CM | POA: Insufficient documentation

## 2010-07-10 DIAGNOSIS — M6281 Muscle weakness (generalized): Secondary | ICD-10-CM | POA: Insufficient documentation

## 2010-07-10 DIAGNOSIS — M2560 Stiffness of unspecified joint, not elsewhere classified: Secondary | ICD-10-CM | POA: Insufficient documentation

## 2010-07-16 ENCOUNTER — Ambulatory Visit: Payer: Medicare Other | Attending: Internal Medicine | Admitting: Rehabilitative and Restorative Service Providers"

## 2010-07-16 DIAGNOSIS — M2569 Stiffness of other specified joint, not elsewhere classified: Secondary | ICD-10-CM | POA: Insufficient documentation

## 2010-07-16 DIAGNOSIS — IMO0001 Reserved for inherently not codable concepts without codable children: Secondary | ICD-10-CM | POA: Insufficient documentation

## 2010-07-16 DIAGNOSIS — M25519 Pain in unspecified shoulder: Secondary | ICD-10-CM | POA: Insufficient documentation

## 2010-07-16 DIAGNOSIS — M6281 Muscle weakness (generalized): Secondary | ICD-10-CM | POA: Insufficient documentation

## 2010-07-16 DIAGNOSIS — M542 Cervicalgia: Secondary | ICD-10-CM | POA: Insufficient documentation

## 2010-07-19 ENCOUNTER — Encounter: Payer: Self-pay | Admitting: Internal Medicine

## 2010-07-22 ENCOUNTER — Ambulatory Visit: Payer: Medicare Other | Attending: Rheumatology | Admitting: Rehabilitative and Restorative Service Providers"

## 2010-07-22 DIAGNOSIS — M542 Cervicalgia: Secondary | ICD-10-CM | POA: Insufficient documentation

## 2010-07-22 DIAGNOSIS — M25519 Pain in unspecified shoulder: Secondary | ICD-10-CM | POA: Insufficient documentation

## 2010-07-22 DIAGNOSIS — M6281 Muscle weakness (generalized): Secondary | ICD-10-CM | POA: Insufficient documentation

## 2010-07-22 DIAGNOSIS — IMO0001 Reserved for inherently not codable concepts without codable children: Secondary | ICD-10-CM | POA: Insufficient documentation

## 2010-07-22 DIAGNOSIS — M2560 Stiffness of unspecified joint, not elsewhere classified: Secondary | ICD-10-CM | POA: Insufficient documentation

## 2010-07-23 ENCOUNTER — Ambulatory Visit (INDEPENDENT_AMBULATORY_CARE_PROVIDER_SITE_OTHER): Payer: Medicare Other | Admitting: Internal Medicine

## 2010-07-23 ENCOUNTER — Encounter: Payer: Self-pay | Admitting: Internal Medicine

## 2010-07-23 ENCOUNTER — Other Ambulatory Visit (INDEPENDENT_AMBULATORY_CARE_PROVIDER_SITE_OTHER): Payer: Medicare Other

## 2010-07-23 ENCOUNTER — Ambulatory Visit: Payer: Medicare Other

## 2010-07-23 VITALS — BP 120/62 | HR 68 | Temp 98.6°F | Ht 60.0 in | Wt 164.1 lb

## 2010-07-23 DIAGNOSIS — R413 Other amnesia: Secondary | ICD-10-CM

## 2010-07-23 LAB — CBC WITH DIFFERENTIAL/PLATELET
Basophils Absolute: 0.1 10*3/uL (ref 0.0–0.1)
Basophils Relative: 0.9 % (ref 0.0–3.0)
Eosinophils Absolute: 0.1 10*3/uL (ref 0.0–0.7)
Eosinophils Relative: 1.6 % (ref 0.0–5.0)
HCT: 35.8 % — ABNORMAL LOW (ref 36.0–46.0)
Hemoglobin: 11.9 g/dL — ABNORMAL LOW (ref 12.0–15.0)
Lymphocytes Relative: 34.9 % (ref 12.0–46.0)
Lymphs Abs: 2.4 10*3/uL (ref 0.7–4.0)
MCHC: 33.2 g/dL (ref 30.0–36.0)
MCV: 90.8 fl (ref 78.0–100.0)
Monocytes Absolute: 0.5 10*3/uL (ref 0.1–1.0)
Monocytes Relative: 7.3 % (ref 3.0–12.0)
Neutro Abs: 3.7 10*3/uL (ref 1.4–7.7)
Neutrophils Relative %: 55.3 % (ref 43.0–77.0)
Platelets: 223 10*3/uL (ref 150.0–400.0)
RBC: 3.94 Mil/uL (ref 3.87–5.11)
RDW: 14.3 % (ref 11.5–14.6)
WBC: 6.8 10*3/uL (ref 4.5–10.5)

## 2010-07-23 LAB — BASIC METABOLIC PANEL
BUN: 15 mg/dL (ref 6–23)
CO2: 31 mEq/L (ref 19–32)
Calcium: 9.8 mg/dL (ref 8.4–10.5)
Chloride: 100 mEq/L (ref 96–112)
Creatinine, Ser: 1 mg/dL (ref 0.4–1.2)
GFR: 66.63 mL/min (ref >60.00)
Glucose, Bld: 107 mg/dL — ABNORMAL HIGH (ref 70–99)
Potassium: 4.8 mEq/L (ref 3.5–5.1)
Sodium: 138 mEq/L (ref 135–145)

## 2010-07-23 LAB — VITAMIN B12: Vitamin B-12: 869 pg/mL (ref 211–911)

## 2010-07-23 NOTE — Patient Instructions (Signed)
It was good to see you today. Test(s) ordered today. Your results will be called to you after review (48-72hours after test completion). If any changes need to be made, you will be notified at that time. we'll make referral for MRI brain. Our office will contact you regarding appointment(s) once made. You will then be notified of results once report reviewed - if no other abnormalities, will start Aricept as discussed Please schedule followup in  6-8 weeks to review symptoms and medications, call sooner if problems.

## 2010-07-23 NOTE — Assessment & Plan Note (Signed)
Exam benign, no evidence for depression Check tsh and b12 and other labs; also mri brain  If normal, will start aricept - explained same to pt who understands and agrees

## 2010-07-23 NOTE — Progress Notes (Signed)
Subjective:    Patient ID: [name], female    DOB: 11-28-1930, 77 y.o.   MRN: [id number]  HPI here for concern about memory loss exac by situational stress of death of sister 2010-06-30 Noted also my dtr and friends- No $ errors or driving errors but poor concentration and short term recall  Also reviewed chronic medical issues: HTN - reports compliance with ongoing medical treatment and no changes in medication dose or frequency. denies adverse side effects related to current therapy.   dyslipidemia - reports compliance with ongoing medical treatment and no changes in medication dose or frequency. denies adverse side effects related to current therapy.   DM2 - reports compliance with ongoing medical treatment and no changes in medication dose or frequency. denies adverse side effects related to current therapy. no hypoglycemia but high readings after steroids use  diffuse, severe arthritis both inflammatory and DJD, dx never clarified in past - follows with rheum - denies any dx or tx of RA or lupus or gout - participating in water areobics - mobic changed to naprosyn - tolerable but constant pain and mild swelling in hands, wrists, knees and neck - pain also complicated by DJD with cont severe pain in both shoulders. trying water aerobics to help with pain but not (as wishes to not undergo total shoulder replacment as rec by ortho)  - uses hydrocodone sparingly  Past Medical History  Diagnosis Date  . S/P CABG x 1   . S/P inguinal hernia repair   . 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 DIABETES MELLITUS, TYPE II 01/17/2009  . HYPERLIPIDEMIA 06/15/2007  . UNSPECIFIED ANEMIA 10/15/2008  . HYPERTENSION 06/15/2007  . CAD, NATIVE VESSEL 06/09/2008  . GERD 06/15/2007  . HIATAL HERNIA 10/29/2000  . DIVERTICULOSIS, 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  . FIBROMYALGIA 06/15/2007  . COLONIC POLYPS, HX OF 09/18/2008  . OA (osteoarthritis)     Review of Systems  Constitutional: Negative for fever.    Respiratory: Negative for shortness of breath.   Cardiovascular: Negative for chest pain.  Neurological: Negative for speech difficulty, weakness and headaches.  Psychiatric/Behavioral: Positive for decreased concentration. Negative for dysphoric mood.       Objective:   Physical Exam  Constitutional: She is oriented to person, place, and time.  Neurological: She is alert and oriented to person, place, and time. She has normal strength. She is not disoriented. She displays no tremor. No cranial nerve deficit or sensory deficit. She displays a negative Romberg sign. Coordination and gait normal.  Psychiatric: She has a normal mood and affect. Her behavior is normal. Judgment and thought content normal. Her mood appears not anxious. Her affect is not inappropriate. Her speech is not tangential and not slurred. She is not agitated, not aggressive, not slowed, not withdrawn and not actively hallucinating. Thought content is not delusional. Cognition and memory are not impaired. She does not express impulsivity or inappropriate judgment. She does not exhibit a depressed mood. She expresses no suicidal plans. She exhibits normal recent memory and normal remote memory. She is attentive.   Neck: Normal range of motion. Neck supple. No JVD present. No thyromegaly present.  Cardiovascular: Normal rate, regular rhythm and normal heart sounds.  No murmur heard. Pulmonary/Chest: Effort normal and breath sounds normal. No respiratory distress. She has no wheezes.  BP 120/62  Pulse 68  Temp(Src) 98.6 F (37 C) (Oral)  Ht 5' (1.524 m)  Wt 164 lb 1.9 oz (74.444 kg)  BMI 32.05 kg/m2  SpO2 96%  Lab Results  Component Value Date   TSH 0.76 04/15/2009   Lab Results  Component Value Date   VITAMINB12 568 10/15/2008   Assessment & Plan:  See problem list. Medications and labs reviewed today.

## 2010-07-24 ENCOUNTER — Encounter: Payer: Medicare Other | Admitting: Rehabilitative and Restorative Service Providers"

## 2010-07-24 ENCOUNTER — Telehealth: Payer: Self-pay | Admitting: Internal Medicine

## 2010-07-24 LAB — DIFFERENTIAL
Basophils Absolute: 0 10*3/uL (ref 0.0–0.1)
Basophils Relative: 0 % (ref 0–1)
Eosinophils Absolute: 0.1 10*3/uL (ref 0.0–0.7)
Eosinophils Relative: 1 % (ref 0–5)
Lymphocytes Relative: 29 % (ref 12–46)
Lymphs Abs: 1.7 10*3/uL (ref 0.7–4.0)
Monocytes Absolute: 0.5 10*3/uL (ref 0.1–1.0)
Monocytes Relative: 9 % (ref 3–12)
Neutro Abs: 3.4 10*3/uL (ref 1.7–7.7)
Neutrophils Relative %: 60 % (ref 43–77)

## 2010-07-24 LAB — COMPREHENSIVE METABOLIC PANEL
ALT: 16 U/L (ref 0–35)
AST: 22 U/L (ref 0–37)
Albumin: 3.7 g/dL (ref 3.5–5.2)
Alkaline Phosphatase: 64 U/L (ref 39–117)
BUN: 13 mg/dL (ref 6–23)
CO2: 29 mEq/L (ref 19–32)
Calcium: 9.6 mg/dL (ref 8.4–10.5)
Chloride: 104 mEq/L (ref 96–112)
Creatinine, Ser: 0.91 mg/dL (ref 0.4–1.2)
GFR calc Af Amer: >60 mL/min (ref >60)
GFR calc non Af Amer: >60 mL/min (ref >60)
Glucose, Bld: 105 mg/dL — ABNORMAL HIGH (ref 70–99)
Potassium: 4.8 mEq/L (ref 3.5–5.1)
Sodium: 140 mEq/L (ref 135–145)
Total Bilirubin: 0.7 mg/dL (ref 0.3–1.2)
Total Protein: 6.6 g/dL (ref 6.0–8.3)

## 2010-07-24 LAB — CBC
HCT: 37 % (ref 36.0–46.0)
Hemoglobin: 12.5 g/dL (ref 12.0–15.0)
MCHC: 33.8 g/dL (ref 30.0–36.0)
MCV: 87.7 fL (ref 78.0–100.0)
Platelets: 157 10*3/uL (ref 150–400)
RBC: 4.22 MIL/uL (ref 3.87–5.11)
RDW: 16.6 % — ABNORMAL HIGH (ref 11.5–15.5)
WBC: 5.7 10*3/uL (ref 4.0–10.5)

## 2010-07-24 LAB — GLUCOSE, CAPILLARY
Glucose-Capillary: 101 mg/dL — ABNORMAL HIGH (ref 70–99)
Glucose-Capillary: 91 mg/dL (ref 70–99)
Glucose-Capillary: 108 mg/dL — ABNORMAL HIGH (ref 70–99)
Glucose-Capillary: 121 mg/dL — ABNORMAL HIGH (ref 70–99)

## 2010-07-24 LAB — TSH: TSH: 0.88 u[IU]/mL (ref 0.35–5.50)

## 2010-07-24 NOTE — Telephone Encounter (Signed)
Please call patient - normal lab results. No medication changes recommended until after we review MRI results - will call again at that time. Thanks.

## 2010-07-24 NOTE — Telephone Encounter (Signed)
Pt return call back, gave results concerning labs and md recommendations...07/24/10@2 :31pm/LMB

## 2010-07-24 NOTE — Telephone Encounter (Signed)
Called pt no ansew LMOM RTC concerning labs...07/24/10@2 :10pm/LMB

## 2010-07-29 ENCOUNTER — Ambulatory Visit: Payer: Medicare Other | Admitting: Rehabilitative and Restorative Service Providers"

## 2010-07-30 ENCOUNTER — Encounter: Payer: Medicare Other | Admitting: Rehabilitative and Restorative Service Providers"

## 2010-07-30 LAB — POCT I-STAT, CHEM 8
BUN: 13 mg/dL (ref 6–23)
Calcium, Ion: 1.13 mmol/L (ref 1.12–1.32)
Chloride: 97 mEq/L (ref 96–112)
Creatinine, Ser: 0.8 mg/dL (ref 0.4–1.2)
Glucose, Bld: 163 mg/dL — ABNORMAL HIGH (ref 70–99)
HCT: 32 % — ABNORMAL LOW (ref 36.0–46.0)
Hemoglobin: 10.9 g/dL — ABNORMAL LOW (ref 12.0–15.0)
Potassium: 4 mEq/L (ref 3.5–5.1)
Sodium: 131 mEq/L — ABNORMAL LOW (ref 135–145)
TCO2: 26 mmol/L (ref 0–100)

## 2010-07-30 LAB — GLUCOSE, CAPILLARY: Glucose-Capillary: 117 mg/dL — ABNORMAL HIGH (ref 70–99)

## 2010-07-31 LAB — GLUCOSE, CAPILLARY
Glucose-Capillary: 100 mg/dL — ABNORMAL HIGH (ref 70–99)
Glucose-Capillary: 114 mg/dL — ABNORMAL HIGH (ref 70–99)
Glucose-Capillary: 119 mg/dL — ABNORMAL HIGH (ref 70–99)
Glucose-Capillary: 70 mg/dL (ref 70–99)
Glucose-Capillary: 124 mg/dL — ABNORMAL HIGH (ref 70–99)

## 2010-08-04 LAB — BASIC METABOLIC PANEL
BUN: 10 mg/dL (ref 6–23)
BUN: 13 mg/dL (ref 6–23)
BUN: 8 mg/dL (ref 6–23)
BUN: 8 mg/dL (ref 6–23)
CO2: 23 mEq/L (ref 19–32)
CO2: 23 mEq/L (ref 19–32)
CO2: 24 mEq/L (ref 19–32)
CO2: 25 mEq/L (ref 19–32)
Calcium: 8.4 mg/dL (ref 8.4–10.5)
Calcium: 8.4 mg/dL (ref 8.4–10.5)
Calcium: 9.1 mg/dL (ref 8.4–10.5)
Calcium: 9.3 mg/dL (ref 8.4–10.5)
Chloride: 100 mEq/L (ref 96–112)
Chloride: 103 mEq/L (ref 96–112)
Chloride: 105 mEq/L (ref 96–112)
Chloride: 106 mEq/L (ref 96–112)
Creatinine, Ser: 0.8 mg/dL (ref 0.4–1.2)
Creatinine, Ser: 0.87 mg/dL (ref 0.4–1.2)
Creatinine, Ser: 0.88 mg/dL (ref 0.4–1.2)
Creatinine, Ser: 0.92 mg/dL (ref 0.4–1.2)
GFR calc Af Amer: >60 mL/min (ref >60)
GFR calc Af Amer: >60 mL/min (ref >60)
GFR calc Af Amer: >60 mL/min (ref >60)
GFR calc Af Amer: >60 mL/min (ref >60)
GFR calc non Af Amer: 60 mL/min — ABNORMAL LOW (ref >60)
GFR calc non Af Amer: >60 mL/min (ref >60)
GFR calc non Af Amer: >60 mL/min (ref >60)
GFR calc non Af Amer: >60 mL/min (ref >60)
Glucose, Bld: 120 mg/dL — ABNORMAL HIGH (ref 70–99)
Glucose, Bld: 129 mg/dL — ABNORMAL HIGH (ref 70–99)
Glucose, Bld: 141 mg/dL — ABNORMAL HIGH (ref 70–99)
Glucose, Bld: 174 mg/dL — ABNORMAL HIGH (ref 70–99)
Potassium: 3.4 mEq/L — ABNORMAL LOW (ref 3.5–5.1)
Potassium: 3.8 mEq/L (ref 3.5–5.1)
Potassium: 3.8 mEq/L (ref 3.5–5.1)
Potassium: 3.8 mEq/L (ref 3.5–5.1)
Sodium: 134 mEq/L — ABNORMAL LOW (ref 135–145)
Sodium: 134 mEq/L — ABNORMAL LOW (ref 135–145)
Sodium: 138 mEq/L (ref 135–145)
Sodium: 139 mEq/L (ref 135–145)

## 2010-08-04 LAB — CK TOTAL AND CKMB (NOT AT ARMC)
CK, MB: 3.1 ng/mL (ref 0.3–4.0)
CK, MB: 3.9 ng/mL (ref 0.3–4.0)
CK, MB: 6.6 ng/mL — ABNORMAL HIGH (ref 0.3–4.0)
Relative Index: 2.7 — ABNORMAL HIGH (ref 0.0–2.5)
Relative Index: 3.1 — ABNORMAL HIGH (ref 0.0–2.5)
Relative Index: 4.7 — ABNORMAL HIGH (ref 0.0–2.5)
Total CK: 114 U/L (ref 7–177)
Total CK: 126 U/L (ref 7–177)
Total CK: 141 U/L (ref 7–177)

## 2010-08-04 LAB — LIPID PANEL
Cholesterol: 204 mg/dL — ABNORMAL HIGH (ref 0–200)
Cholesterol: 225 mg/dL — ABNORMAL HIGH (ref 0–200)
HDL: 92 mg/dL (ref >39)
HDL: 97 mg/dL (ref >39)
LDL Cholesterol: 115 mg/dL — ABNORMAL HIGH (ref 0–99)
LDL Cholesterol: 98 mg/dL (ref 0–99)
Total CHOL/HDL Ratio: 2.2 RATIO
Total CHOL/HDL Ratio: 2.3 RATIO
Triglycerides: 65 mg/dL (ref <150)
Triglycerides: 70 mg/dL (ref <150)
VLDL: 13 mg/dL (ref 0–40)
VLDL: 14 mg/dL (ref 0–40)

## 2010-08-04 LAB — POCT CARDIAC MARKERS
CKMB, poc: 4.2 ng/mL (ref 1.0–8.0)
CKMB, poc: 5.8 ng/mL (ref 1.0–8.0)
Myoglobin, poc: 109 ng/mL (ref 12–200)
Myoglobin, poc: 89.7 ng/mL (ref 12–200)
Troponin i, poc: 0.11 ng/mL — ABNORMAL HIGH (ref 0.00–0.09)
Troponin i, poc: 0.17 ng/mL — ABNORMAL HIGH (ref 0.00–0.09)

## 2010-08-04 LAB — URINALYSIS, ROUTINE W REFLEX MICROSCOPIC
Bilirubin Urine: NEGATIVE
Bilirubin Urine: NEGATIVE
Glucose, UA: NEGATIVE mg/dL
Glucose, UA: NEGATIVE mg/dL
Hgb urine dipstick: NEGATIVE
Ketones, ur: NEGATIVE mg/dL
Ketones, ur: NEGATIVE mg/dL
Nitrite: NEGATIVE
Nitrite: NEGATIVE
Protein, ur: NEGATIVE mg/dL
Protein, ur: NEGATIVE mg/dL
Specific Gravity, Urine: 1.009 (ref 1.005–1.030)
Specific Gravity, Urine: 1.01 (ref 1.005–1.030)
Urobilinogen, UA: 0.2 mg/dL (ref 0.0–1.0)
Urobilinogen, UA: 0.2 mg/dL (ref 0.0–1.0)
pH: 5.5 (ref 5.0–8.0)
pH: 6 (ref 5.0–8.0)

## 2010-08-04 LAB — CBC
HCT: 33 % — ABNORMAL LOW (ref 36.0–46.0)
HCT: 33.7 % — ABNORMAL LOW (ref 36.0–46.0)
HCT: 35 % — ABNORMAL LOW (ref 36.0–46.0)
HCT: 39.5 % (ref 36.0–46.0)
HCT: 39.6 % (ref 36.0–46.0)
HCT: 41.2 % (ref 36.0–46.0)
Hemoglobin: 11.1 g/dL — ABNORMAL LOW (ref 12.0–15.0)
Hemoglobin: 11.1 g/dL — ABNORMAL LOW (ref 12.0–15.0)
Hemoglobin: 11.6 g/dL — ABNORMAL LOW (ref 12.0–15.0)
Hemoglobin: 13.1 g/dL (ref 12.0–15.0)
Hemoglobin: 13.2 g/dL (ref 12.0–15.0)
Hemoglobin: 13.5 g/dL (ref 12.0–15.0)
MCHC: 32.8 g/dL (ref 30.0–36.0)
MCHC: 33 g/dL (ref 30.0–36.0)
MCHC: 33.1 g/dL (ref 30.0–36.0)
MCHC: 33.1 g/dL (ref 30.0–36.0)
MCHC: 33.3 g/dL (ref 30.0–36.0)
MCHC: 33.6 g/dL (ref 30.0–36.0)
MCV: 91.3 fL (ref 78.0–100.0)
MCV: 92.3 fL (ref 78.0–100.0)
MCV: 92.3 fL (ref 78.0–100.0)
MCV: 92.4 fL (ref 78.0–100.0)
MCV: 92.6 fL (ref 78.0–100.0)
MCV: 92.8 fL (ref 78.0–100.0)
Platelets: 176 10*3/uL (ref 150–400)
Platelets: 182 10*3/uL (ref 150–400)
Platelets: 185 10*3/uL (ref 150–400)
Platelets: 225 10*3/uL (ref 150–400)
Platelets: 230 10*3/uL (ref 150–400)
Platelets: 239 10*3/uL (ref 150–400)
RBC: 3.57 MIL/uL — ABNORMAL LOW (ref 3.87–5.11)
RBC: 3.65 MIL/uL — ABNORMAL LOW (ref 3.87–5.11)
RBC: 3.79 MIL/uL — ABNORMAL LOW (ref 3.87–5.11)
RBC: 4.28 MIL/uL (ref 3.87–5.11)
RBC: 4.34 MIL/uL (ref 3.87–5.11)
RBC: 4.44 MIL/uL (ref 3.87–5.11)
RDW: 13.7 % (ref 11.5–15.5)
RDW: 13.7 % (ref 11.5–15.5)
RDW: 13.8 % (ref 11.5–15.5)
RDW: 13.9 % (ref 11.5–15.5)
RDW: 14 % (ref 11.5–15.5)
RDW: 14.2 % (ref 11.5–15.5)
WBC: 10.4 10*3/uL (ref 4.0–10.5)
WBC: 10.6 10*3/uL — ABNORMAL HIGH (ref 4.0–10.5)
WBC: 7 10*3/uL (ref 4.0–10.5)
WBC: 7.7 10*3/uL (ref 4.0–10.5)
WBC: 7.8 10*3/uL (ref 4.0–10.5)
WBC: 7.9 10*3/uL (ref 4.0–10.5)

## 2010-08-04 LAB — GLUCOSE, CAPILLARY
Glucose-Capillary: 108 mg/dL — ABNORMAL HIGH (ref 70–99)
Glucose-Capillary: 108 mg/dL — ABNORMAL HIGH (ref 70–99)
Glucose-Capillary: 110 mg/dL — ABNORMAL HIGH (ref 70–99)
Glucose-Capillary: 111 mg/dL — ABNORMAL HIGH (ref 70–99)
Glucose-Capillary: 125 mg/dL — ABNORMAL HIGH (ref 70–99)
Glucose-Capillary: 127 mg/dL — ABNORMAL HIGH (ref 70–99)
Glucose-Capillary: 131 mg/dL — ABNORMAL HIGH (ref 70–99)
Glucose-Capillary: 137 mg/dL — ABNORMAL HIGH (ref 70–99)
Glucose-Capillary: 146 mg/dL — ABNORMAL HIGH (ref 70–99)
Glucose-Capillary: 147 mg/dL — ABNORMAL HIGH (ref 70–99)
Glucose-Capillary: 151 mg/dL — ABNORMAL HIGH (ref 70–99)
Glucose-Capillary: 152 mg/dL — ABNORMAL HIGH (ref 70–99)
Glucose-Capillary: 156 mg/dL — ABNORMAL HIGH (ref 70–99)
Glucose-Capillary: 157 mg/dL — ABNORMAL HIGH (ref 70–99)
Glucose-Capillary: 172 mg/dL — ABNORMAL HIGH (ref 70–99)
Glucose-Capillary: 172 mg/dL — ABNORMAL HIGH (ref 70–99)
Glucose-Capillary: 179 mg/dL — ABNORMAL HIGH (ref 70–99)

## 2010-08-04 LAB — HEMOGLOBIN A1C
Hgb A1c MFr Bld: 6.3 % — ABNORMAL HIGH (ref 4.6–6.1)
Mean Plasma Glucose: 134 mg/dL

## 2010-08-04 LAB — COMPREHENSIVE METABOLIC PANEL
ALT: 15 U/L (ref 0–35)
ALT: 43 U/L — ABNORMAL HIGH (ref 0–35)
AST: 20 U/L (ref 0–37)
AST: 23 U/L (ref 0–37)
Albumin: 2.9 g/dL — ABNORMAL LOW (ref 3.5–5.2)
Albumin: 3.7 g/dL (ref 3.5–5.2)
Alkaline Phosphatase: 54 U/L (ref 39–117)
Alkaline Phosphatase: 67 U/L (ref 39–117)
BUN: 11 mg/dL (ref 6–23)
BUN: 9 mg/dL (ref 6–23)
CO2: 23 mEq/L (ref 19–32)
CO2: 25 mEq/L (ref 19–32)
Calcium: 8.5 mg/dL (ref 8.4–10.5)
Calcium: 9.3 mg/dL (ref 8.4–10.5)
Chloride: 104 mEq/L (ref 96–112)
Chloride: 105 mEq/L (ref 96–112)
Creatinine, Ser: 0.69 mg/dL (ref 0.4–1.2)
Creatinine, Ser: 0.95 mg/dL (ref 0.4–1.2)
GFR calc Af Amer: >60 mL/min (ref >60)
GFR calc Af Amer: >60 mL/min (ref >60)
GFR calc non Af Amer: 57 mL/min — ABNORMAL LOW (ref >60)
GFR calc non Af Amer: >60 mL/min (ref >60)
Glucose, Bld: 161 mg/dL — ABNORMAL HIGH (ref 70–99)
Glucose, Bld: 97 mg/dL (ref 70–99)
Potassium: 3.7 mEq/L (ref 3.5–5.1)
Potassium: 4.1 mEq/L (ref 3.5–5.1)
Sodium: 135 mEq/L (ref 135–145)
Sodium: 139 mEq/L (ref 135–145)
Total Bilirubin: 0.6 mg/dL (ref 0.3–1.2)
Total Bilirubin: 0.7 mg/dL (ref 0.3–1.2)
Total Protein: 6.3 g/dL (ref 6.0–8.3)
Total Protein: 7.1 g/dL (ref 6.0–8.3)

## 2010-08-04 LAB — DIFFERENTIAL
Basophils Absolute: 0.1 10*3/uL (ref 0.0–0.1)
Basophils Relative: 1 % (ref 0–1)
Eosinophils Absolute: 0.1 10*3/uL (ref 0.0–0.7)
Eosinophils Relative: 1 % (ref 0–5)
Lymphocytes Relative: 33 % (ref 12–46)
Lymphs Abs: 2.3 10*3/uL (ref 0.7–4.0)
Monocytes Absolute: 0.4 10*3/uL (ref 0.1–1.0)
Monocytes Relative: 6 % (ref 3–12)
Neutro Abs: 4.1 10*3/uL (ref 1.7–7.7)
Neutrophils Relative %: 59 % (ref 43–77)

## 2010-08-04 LAB — RAPID URINE DRUG SCREEN, HOSP PERFORMED
Amphetamines: NOT DETECTED
Barbiturates: NOT DETECTED
Benzodiazepines: NOT DETECTED
Cocaine: NOT DETECTED
Opiates: NOT DETECTED
Tetrahydrocannabinol: NOT DETECTED

## 2010-08-04 LAB — URINE CULTURE: Colony Count: 60000

## 2010-08-04 LAB — PHOSPHORUS
Phosphorus: 3.5 mg/dL (ref 2.3–4.6)
Phosphorus: 3.9 mg/dL (ref 2.3–4.6)

## 2010-08-04 LAB — CULTURE, BLOOD (ROUTINE X 2)
Culture: NO GROWTH
Culture: NO GROWTH

## 2010-08-04 LAB — URINE MICROSCOPIC-ADD ON

## 2010-08-04 LAB — CALCIUM: Calcium: 9.3 mg/dL (ref 8.4–10.5)

## 2010-08-04 LAB — MAGNESIUM: Magnesium: 1.9 mg/dL (ref 1.5–2.5)

## 2010-08-04 LAB — TROPONIN I
Troponin I: 0.2 ng/mL — ABNORMAL HIGH (ref 0.00–0.06)
Troponin I: 0.39 ng/mL — ABNORMAL HIGH (ref 0.00–0.06)

## 2010-08-04 LAB — HEPARIN LEVEL (UNFRACTIONATED)
Heparin Unfractionated: 0.28 IU/mL — ABNORMAL LOW (ref 0.30–0.70)
Heparin Unfractionated: 0.32 IU/mL (ref 0.30–0.70)
Heparin Unfractionated: 0.38 IU/mL (ref 0.30–0.70)
Heparin Unfractionated: 0.58 IU/mL (ref 0.30–0.70)

## 2010-08-04 LAB — PROTIME-INR
INR: 0.9 (ref 0.00–1.49)
Prothrombin Time: 12.3 seconds (ref 11.6–15.2)

## 2010-08-04 LAB — URIC ACID: Uric Acid, Serum: 5 mg/dL (ref 2.4–7.0)

## 2010-08-04 LAB — BRAIN NATRIURETIC PEPTIDE: Pro B Natriuretic peptide (BNP): 72 pg/mL (ref 0.0–100.0)

## 2010-08-04 LAB — APTT: aPTT: 28 seconds (ref 24–37)

## 2010-08-04 LAB — TSH: TSH: 1.2 u[IU]/mL (ref 0.350–4.500)

## 2010-08-04 LAB — HOMOCYSTEINE: Homocysteine: 6.7 umol/L (ref 4.0–15.4)

## 2010-08-05 ENCOUNTER — Ambulatory Visit: Payer: Medicare Other | Admitting: Rehabilitative and Restorative Service Providers"

## 2010-08-05 LAB — POCT I-STAT 3, ART BLOOD GAS (G3+)
Acid-base deficit: 1 mmol/L (ref 0.0–2.0)
Acid-base deficit: 13 mmol/L — ABNORMAL HIGH (ref 0.0–2.0)
Acid-base deficit: 5 mmol/L — ABNORMAL HIGH (ref 0.0–2.0)
Acid-base deficit: 5 mmol/L — ABNORMAL HIGH (ref 0.0–2.0)
Bicarbonate: 11.2 mEq/L — ABNORMAL LOW (ref 20.0–24.0)
Bicarbonate: 19.3 mEq/L — ABNORMAL LOW (ref 20.0–24.0)
Bicarbonate: 19.9 mEq/L — ABNORMAL LOW (ref 20.0–24.0)
Bicarbonate: 24 mEq/L (ref 20.0–24.0)
Bicarbonate: 24.3 mEq/L — ABNORMAL HIGH (ref 20.0–24.0)
O2 Saturation: 100 %
O2 Saturation: 89 %
O2 Saturation: 94 %
O2 Saturation: 94 %
O2 Saturation: 96 %
Patient temperature: 35.9
Patient temperature: 37.1
Patient temperature: 37.3
Patient temperature: 37.4
TCO2: 12 mmol/L (ref 0–100)
TCO2: 20 mmol/L (ref 0–100)
TCO2: 21 mmol/L (ref 0–100)
TCO2: 25 mmol/L (ref 0–100)
TCO2: 25 mmol/L (ref 0–100)
pCO2 arterial: 21.8 mmHg — ABNORMAL LOW (ref 35.0–45.0)
pCO2 arterial: 32.7 mmHg — ABNORMAL LOW (ref 35.0–45.0)
pCO2 arterial: 33.6 mmHg — ABNORMAL LOW (ref 35.0–45.0)
pCO2 arterial: 38.3 mmHg (ref 35.0–45.0)
pCO2 arterial: 38.3 mmHg (ref 35.0–45.0)
pH, Arterial: 7.32 — ABNORMAL LOW (ref 7.350–7.400)
pH, Arterial: 7.368 (ref 7.350–7.400)
pH, Arterial: 7.387 (ref 7.350–7.400)
pH, Arterial: 7.406 — ABNORMAL HIGH (ref 7.350–7.400)
pH, Arterial: 7.411 — ABNORMAL HIGH (ref 7.350–7.400)
pO2, Arterial: 339 mmHg — ABNORMAL HIGH (ref 80.0–100.0)
pO2, Arterial: 53 mmHg — ABNORMAL LOW (ref 80.0–100.0)
pO2, Arterial: 74 mmHg — ABNORMAL LOW (ref 80.0–100.0)
pO2, Arterial: 75 mmHg — ABNORMAL LOW (ref 80.0–100.0)
pO2, Arterial: 82 mmHg (ref 80.0–100.0)

## 2010-08-05 LAB — BASIC METABOLIC PANEL
BUN: 10 mg/dL (ref 6–23)
BUN: 10 mg/dL (ref 6–23)
BUN: 12 mg/dL (ref 6–23)
BUN: 7 mg/dL (ref 6–23)
CO2: 23 mEq/L (ref 19–32)
CO2: 25 mEq/L (ref 19–32)
CO2: 27 mEq/L (ref 19–32)
CO2: 29 mEq/L (ref 19–32)
Calcium: 7.3 mg/dL — ABNORMAL LOW (ref 8.4–10.5)
Calcium: 8.1 mg/dL — ABNORMAL LOW (ref 8.4–10.5)
Calcium: 8.5 mg/dL (ref 8.4–10.5)
Calcium: 8.5 mg/dL (ref 8.4–10.5)
Chloride: 101 mEq/L (ref 96–112)
Chloride: 101 mEq/L (ref 96–112)
Chloride: 102 mEq/L (ref 96–112)
Chloride: 105 mEq/L (ref 96–112)
Creatinine, Ser: 0.72 mg/dL (ref 0.4–1.2)
Creatinine, Ser: 0.75 mg/dL (ref 0.4–1.2)
Creatinine, Ser: 0.78 mg/dL (ref 0.4–1.2)
Creatinine, Ser: 0.89 mg/dL (ref 0.4–1.2)
GFR calc Af Amer: >60 mL/min (ref >60)
GFR calc Af Amer: >60 mL/min (ref >60)
GFR calc Af Amer: >60 mL/min (ref >60)
GFR calc Af Amer: >60 mL/min (ref >60)
GFR calc non Af Amer: >60 mL/min (ref >60)
GFR calc non Af Amer: >60 mL/min (ref >60)
GFR calc non Af Amer: >60 mL/min (ref >60)
GFR calc non Af Amer: >60 mL/min (ref >60)
Glucose, Bld: 112 mg/dL — ABNORMAL HIGH (ref 70–99)
Glucose, Bld: 141 mg/dL — ABNORMAL HIGH (ref 70–99)
Glucose, Bld: 177 mg/dL — ABNORMAL HIGH (ref 70–99)
Glucose, Bld: 79 mg/dL (ref 70–99)
Potassium: 3.4 mEq/L — ABNORMAL LOW (ref 3.5–5.1)
Potassium: 3.8 mEq/L (ref 3.5–5.1)
Potassium: 3.9 mEq/L (ref 3.5–5.1)
Potassium: 4 mEq/L (ref 3.5–5.1)
Sodium: 133 mEq/L — ABNORMAL LOW (ref 135–145)
Sodium: 134 mEq/L — ABNORMAL LOW (ref 135–145)
Sodium: 138 mEq/L (ref 135–145)
Sodium: 138 mEq/L (ref 135–145)

## 2010-08-05 LAB — CBC
HCT: 27.5 % — ABNORMAL LOW (ref 36.0–46.0)
HCT: 28.7 % — ABNORMAL LOW (ref 36.0–46.0)
HCT: 29.2 % — ABNORMAL LOW (ref 36.0–46.0)
HCT: 30.4 % — ABNORMAL LOW (ref 36.0–46.0)
HCT: 32.3 % — ABNORMAL LOW (ref 36.0–46.0)
HCT: 32.8 % — ABNORMAL LOW (ref 36.0–46.0)
HCT: 34.7 % — ABNORMAL LOW (ref 36.0–46.0)
HCT: 36.2 % (ref 36.0–46.0)
Hemoglobin: 10 g/dL — ABNORMAL LOW (ref 12.0–15.0)
Hemoglobin: 10.3 g/dL — ABNORMAL LOW (ref 12.0–15.0)
Hemoglobin: 10.9 g/dL — ABNORMAL LOW (ref 12.0–15.0)
Hemoglobin: 11.1 g/dL — ABNORMAL LOW (ref 12.0–15.0)
Hemoglobin: 11.4 g/dL — ABNORMAL LOW (ref 12.0–15.0)
Hemoglobin: 12.1 g/dL (ref 12.0–15.0)
Hemoglobin: 9 g/dL — ABNORMAL LOW (ref 12.0–15.0)
Hemoglobin: 9.8 g/dL — ABNORMAL LOW (ref 12.0–15.0)
MCHC: 32.6 g/dL (ref 30.0–36.0)
MCHC: 32.8 g/dL (ref 30.0–36.0)
MCHC: 33.3 g/dL (ref 30.0–36.0)
MCHC: 33.7 g/dL (ref 30.0–36.0)
MCHC: 33.8 g/dL (ref 30.0–36.0)
MCHC: 34 g/dL (ref 30.0–36.0)
MCHC: 34.3 g/dL (ref 30.0–36.0)
MCHC: 34.3 g/dL (ref 30.0–36.0)
MCV: 90.8 fL (ref 78.0–100.0)
MCV: 91.5 fL (ref 78.0–100.0)
MCV: 91.8 fL (ref 78.0–100.0)
MCV: 91.8 fL (ref 78.0–100.0)
MCV: 92.1 fL (ref 78.0–100.0)
MCV: 92.2 fL (ref 78.0–100.0)
MCV: 92.3 fL (ref 78.0–100.0)
MCV: 92.9 fL (ref 78.0–100.0)
Platelets: 125 10*3/uL — ABNORMAL LOW (ref 150–400)
Platelets: 132 10*3/uL — ABNORMAL LOW (ref 150–400)
Platelets: 137 10*3/uL — ABNORMAL LOW (ref 150–400)
Platelets: 138 10*3/uL — ABNORMAL LOW (ref 150–400)
Platelets: 141 10*3/uL — ABNORMAL LOW (ref 150–400)
Platelets: 163 10*3/uL (ref 150–400)
Platelets: 176 10*3/uL (ref 150–400)
Platelets: 193 10*3/uL (ref 150–400)
RBC: 2.98 MIL/uL — ABNORMAL LOW (ref 3.87–5.11)
RBC: 3.11 MIL/uL — ABNORMAL LOW (ref 3.87–5.11)
RBC: 3.22 MIL/uL — ABNORMAL LOW (ref 3.87–5.11)
RBC: 3.32 MIL/uL — ABNORMAL LOW (ref 3.87–5.11)
RBC: 3.52 MIL/uL — ABNORMAL LOW (ref 3.87–5.11)
RBC: 3.56 MIL/uL — ABNORMAL LOW (ref 3.87–5.11)
RBC: 3.74 MIL/uL — ABNORMAL LOW (ref 3.87–5.11)
RBC: 3.94 MIL/uL (ref 3.87–5.11)
RDW: 13.5 % (ref 11.5–15.5)
RDW: 13.7 % (ref 11.5–15.5)
RDW: 13.9 % (ref 11.5–15.5)
RDW: 13.9 % (ref 11.5–15.5)
RDW: 14.1 % (ref 11.5–15.5)
RDW: 14.2 % (ref 11.5–15.5)
RDW: 14.2 % (ref 11.5–15.5)
RDW: 14.2 % (ref 11.5–15.5)
WBC: 10 10*3/uL (ref 4.0–10.5)
WBC: 10.8 10*3/uL — ABNORMAL HIGH (ref 4.0–10.5)
WBC: 11.6 10*3/uL — ABNORMAL HIGH (ref 4.0–10.5)
WBC: 14.4 10*3/uL — ABNORMAL HIGH (ref 4.0–10.5)
WBC: 15 10*3/uL — ABNORMAL HIGH (ref 4.0–10.5)
WBC: 7.8 10*3/uL (ref 4.0–10.5)
WBC: 9.2 10*3/uL (ref 4.0–10.5)
WBC: 9.2 10*3/uL (ref 4.0–10.5)

## 2010-08-05 LAB — GLUCOSE, CAPILLARY
Glucose-Capillary: 110 mg/dL — ABNORMAL HIGH (ref 70–99)
Glucose-Capillary: 118 mg/dL — ABNORMAL HIGH (ref 70–99)
Glucose-Capillary: 118 mg/dL — ABNORMAL HIGH (ref 70–99)
Glucose-Capillary: 132 mg/dL — ABNORMAL HIGH (ref 70–99)
Glucose-Capillary: 137 mg/dL — ABNORMAL HIGH (ref 70–99)
Glucose-Capillary: 139 mg/dL — ABNORMAL HIGH (ref 70–99)
Glucose-Capillary: 140 mg/dL — ABNORMAL HIGH (ref 70–99)
Glucose-Capillary: 142 mg/dL — ABNORMAL HIGH (ref 70–99)
Glucose-Capillary: 143 mg/dL — ABNORMAL HIGH (ref 70–99)
Glucose-Capillary: 149 mg/dL — ABNORMAL HIGH (ref 70–99)
Glucose-Capillary: 149 mg/dL — ABNORMAL HIGH (ref 70–99)
Glucose-Capillary: 150 mg/dL — ABNORMAL HIGH (ref 70–99)
Glucose-Capillary: 150 mg/dL — ABNORMAL HIGH (ref 70–99)
Glucose-Capillary: 155 mg/dL — ABNORMAL HIGH (ref 70–99)
Glucose-Capillary: 156 mg/dL — ABNORMAL HIGH (ref 70–99)
Glucose-Capillary: 161 mg/dL — ABNORMAL HIGH (ref 70–99)
Glucose-Capillary: 162 mg/dL — ABNORMAL HIGH (ref 70–99)
Glucose-Capillary: 166 mg/dL — ABNORMAL HIGH (ref 70–99)
Glucose-Capillary: 170 mg/dL — ABNORMAL HIGH (ref 70–99)
Glucose-Capillary: 180 mg/dL — ABNORMAL HIGH (ref 70–99)
Glucose-Capillary: 206 mg/dL — ABNORMAL HIGH (ref 70–99)
Glucose-Capillary: 212 mg/dL — ABNORMAL HIGH (ref 70–99)
Glucose-Capillary: 214 mg/dL — ABNORMAL HIGH (ref 70–99)
Glucose-Capillary: 73 mg/dL (ref 70–99)
Glucose-Capillary: 78 mg/dL (ref 70–99)
Glucose-Capillary: 85 mg/dL (ref 70–99)
Glucose-Capillary: 93 mg/dL (ref 70–99)
Glucose-Capillary: 98 mg/dL (ref 70–99)

## 2010-08-05 LAB — POCT I-STAT 4, (NA,K, GLUC, HGB,HCT)
Glucose, Bld: 136 mg/dL — ABNORMAL HIGH (ref 70–99)
Glucose, Bld: 138 mg/dL — ABNORMAL HIGH (ref 70–99)
Glucose, Bld: 143 mg/dL — ABNORMAL HIGH (ref 70–99)
Glucose, Bld: 150 mg/dL — ABNORMAL HIGH (ref 70–99)
Glucose, Bld: 164 mg/dL — ABNORMAL HIGH (ref 70–99)
Glucose, Bld: 167 mg/dL — ABNORMAL HIGH (ref 70–99)
Glucose, Bld: 197 mg/dL — ABNORMAL HIGH (ref 70–99)
HCT: 22 % — ABNORMAL LOW (ref 36.0–46.0)
HCT: 23 % — ABNORMAL LOW (ref 36.0–46.0)
HCT: 23 % — ABNORMAL LOW (ref 36.0–46.0)
HCT: 25 % — ABNORMAL LOW (ref 36.0–46.0)
HCT: 28 % — ABNORMAL LOW (ref 36.0–46.0)
HCT: 35 % — ABNORMAL LOW (ref 36.0–46.0)
HCT: 40 % (ref 36.0–46.0)
Hemoglobin: 11.9 g/dL — ABNORMAL LOW (ref 12.0–15.0)
Hemoglobin: 13.6 g/dL (ref 12.0–15.0)
Hemoglobin: 7.5 g/dL — CL (ref 12.0–15.0)
Hemoglobin: 7.8 g/dL — CL (ref 12.0–15.0)
Hemoglobin: 7.8 g/dL — CL (ref 12.0–15.0)
Hemoglobin: 8.5 g/dL — ABNORMAL LOW (ref 12.0–15.0)
Hemoglobin: 9.5 g/dL — ABNORMAL LOW (ref 12.0–15.0)
Potassium: 3.3 mEq/L — ABNORMAL LOW (ref 3.5–5.1)
Potassium: 3.5 mEq/L (ref 3.5–5.1)
Potassium: 3.6 mEq/L (ref 3.5–5.1)
Potassium: 3.9 mEq/L (ref 3.5–5.1)
Potassium: 4.1 mEq/L (ref 3.5–5.1)
Potassium: 4.5 mEq/L (ref 3.5–5.1)
Potassium: 4.9 mEq/L (ref 3.5–5.1)
Sodium: 136 mEq/L (ref 135–145)
Sodium: 136 mEq/L (ref 135–145)
Sodium: 136 mEq/L (ref 135–145)
Sodium: 138 mEq/L (ref 135–145)
Sodium: 138 mEq/L (ref 135–145)
Sodium: 139 mEq/L (ref 135–145)
Sodium: 139 mEq/L (ref 135–145)

## 2010-08-05 LAB — BLOOD GAS, ARTERIAL
Acid-base deficit: 0.4 mmol/L (ref 0.0–2.0)
Bicarbonate: 23.5 mEq/L (ref 20.0–24.0)
FIO2: 0.21 %
O2 Saturation: 96.7 %
Patient temperature: 98.6
TCO2: 24.7 mmol/L (ref 0–100)
pCO2 arterial: 36.9 mmHg (ref 35.0–45.0)
pH, Arterial: 7.421 — ABNORMAL HIGH (ref 7.350–7.400)
pO2, Arterial: 80.5 mmHg (ref 80.0–100.0)

## 2010-08-05 LAB — COMPREHENSIVE METABOLIC PANEL
ALT: 17 U/L (ref 0–35)
AST: 18 U/L (ref 0–37)
Albumin: 2.4 g/dL — ABNORMAL LOW (ref 3.5–5.2)
Alkaline Phosphatase: 64 U/L (ref 39–117)
BUN: 9 mg/dL (ref 6–23)
CO2: 21 mEq/L (ref 19–32)
Calcium: 8.3 mg/dL — ABNORMAL LOW (ref 8.4–10.5)
Chloride: 105 mEq/L (ref 96–112)
Creatinine, Ser: 0.9 mg/dL (ref 0.4–1.2)
GFR calc Af Amer: >60 mL/min (ref >60)
GFR calc non Af Amer: >60 mL/min (ref >60)
Glucose, Bld: 136 mg/dL — ABNORMAL HIGH (ref 70–99)
Potassium: 3.6 mEq/L (ref 3.5–5.1)
Sodium: 133 mEq/L — ABNORMAL LOW (ref 135–145)
Total Bilirubin: 0.7 mg/dL (ref 0.3–1.2)
Total Protein: 6.1 g/dL (ref 6.0–8.3)

## 2010-08-05 LAB — PROTIME-INR
INR: 1.2 (ref 0.00–1.49)
INR: 1.4 (ref 0.00–1.49)
Prothrombin Time: 15.2 seconds (ref 11.6–15.2)
Prothrombin Time: 17.3 seconds — ABNORMAL HIGH (ref 11.6–15.2)

## 2010-08-05 LAB — MAGNESIUM
Magnesium: 2.2 mg/dL (ref 1.5–2.5)
Magnesium: 2.3 mg/dL (ref 1.5–2.5)
Magnesium: 2.4 mg/dL (ref 1.5–2.5)

## 2010-08-05 LAB — TYPE AND SCREEN
ABO/RH(D): O POS
Antibody Screen: NEGATIVE

## 2010-08-05 LAB — PLATELET COUNT: Platelets: 117 10*3/uL — ABNORMAL LOW (ref 150–400)

## 2010-08-05 LAB — CREATININE, SERUM
Creatinine, Ser: 0.48 mg/dL (ref 0.4–1.2)
Creatinine, Ser: 0.78 mg/dL (ref 0.4–1.2)
GFR calc Af Amer: >60 mL/min (ref >60)
GFR calc Af Amer: >60 mL/min (ref >60)
GFR calc non Af Amer: >60 mL/min (ref >60)
GFR calc non Af Amer: >60 mL/min (ref >60)

## 2010-08-05 LAB — HEMOGLOBIN AND HEMATOCRIT, BLOOD
HCT: 18.7 % — ABNORMAL LOW (ref 36.0–46.0)
Hemoglobin: 6.2 g/dL — CL (ref 12.0–15.0)

## 2010-08-05 LAB — APTT
aPTT: 33 seconds (ref 24–37)
aPTT: 67 seconds — ABNORMAL HIGH (ref 24–37)

## 2010-08-05 LAB — HEPARIN LEVEL (UNFRACTIONATED)
Heparin Unfractionated: 0.22 IU/mL — ABNORMAL LOW (ref 0.30–0.70)
Heparin Unfractionated: 0.35 IU/mL (ref 0.30–0.70)

## 2010-08-05 LAB — ABO/RH: ABO/RH(D): O POS

## 2010-08-06 ENCOUNTER — Ambulatory Visit
Admission: RE | Admit: 2010-08-06 | Discharge: 2010-08-06 | Disposition: A | Discharge status: Home / Self Care | Payer: Medicare Other | Source: Ambulatory Visit | Attending: Internal Medicine | Admitting: Internal Medicine

## 2010-08-06 DIAGNOSIS — R413 Other amnesia: Secondary | ICD-10-CM

## 2010-08-07 ENCOUNTER — Ambulatory Visit: Payer: Medicare Other | Admitting: Rehabilitative and Restorative Service Providers"

## 2010-08-07 ENCOUNTER — Telehealth: Payer: Self-pay

## 2010-08-07 NOTE — Telephone Encounter (Signed)
Pt advised of results. 

## 2010-08-07 NOTE — Telephone Encounter (Signed)
Message copied by Margaret Pyle on Thu Aug 07, 2010  2:32 PM ------      Message from: [name]      Created: Wed Aug 06, 2010  6:09 PM       Lucy to call pt - MRI neg for stroke or other reason for memory loss  - no change in tx at this time

## 2010-08-12 ENCOUNTER — Ambulatory Visit: Payer: Medicare Other | Admitting: Physical Therapy

## 2010-08-14 ENCOUNTER — Ambulatory Visit: Payer: Medicare Other | Admitting: Physical Therapy

## 2010-08-19 ENCOUNTER — Ambulatory Visit: Payer: Medicare Other | Attending: Rheumatology | Admitting: Rehabilitative and Restorative Service Providers"

## 2010-08-19 DIAGNOSIS — IMO0001 Reserved for inherently not codable concepts without codable children: Secondary | ICD-10-CM | POA: Insufficient documentation

## 2010-08-19 DIAGNOSIS — M2560 Stiffness of unspecified joint, not elsewhere classified: Secondary | ICD-10-CM | POA: Insufficient documentation

## 2010-08-19 DIAGNOSIS — M25519 Pain in unspecified shoulder: Secondary | ICD-10-CM | POA: Insufficient documentation

## 2010-08-19 DIAGNOSIS — M542 Cervicalgia: Secondary | ICD-10-CM | POA: Insufficient documentation

## 2010-08-19 DIAGNOSIS — M6281 Muscle weakness (generalized): Secondary | ICD-10-CM | POA: Insufficient documentation

## 2010-08-21 ENCOUNTER — Ambulatory Visit: Payer: Medicare Other | Admitting: Rehabilitative and Restorative Service Providers"

## 2010-08-26 ENCOUNTER — Ambulatory Visit: Payer: Medicare Other | Admitting: Physical Therapy

## 2010-08-28 ENCOUNTER — Encounter: Payer: Self-pay | Admitting: Internal Medicine

## 2010-08-28 ENCOUNTER — Ambulatory Visit (INDEPENDENT_AMBULATORY_CARE_PROVIDER_SITE_OTHER): Payer: Medicare Other | Admitting: Internal Medicine

## 2010-08-28 VITALS — BP 141/69 | HR 65 | Resp 18 | Ht 59.0 in | Wt 168.0 lb

## 2010-08-28 DIAGNOSIS — I2510 Atherosclerotic heart disease of native coronary artery without angina pectoris: Secondary | ICD-10-CM

## 2010-08-28 DIAGNOSIS — E785 Hyperlipidemia, unspecified: Secondary | ICD-10-CM

## 2010-08-28 DIAGNOSIS — I10 Essential (primary) hypertension: Secondary | ICD-10-CM

## 2010-08-28 NOTE — Assessment & Plan Note (Signed)
Adequate control. 

## 2010-08-28 NOTE — Patient Instructions (Signed)
Your physician wants you to follow-up in: January 2013 You will receive a reminder letter in the mail two months in advance. If you don't receive a letter, please call our office to schedule the follow-up appointment.  

## 2010-08-28 NOTE — Assessment & Plan Note (Signed)
Doing well.  No changes.  Stay active.

## 2010-08-28 NOTE — Assessment & Plan Note (Signed)
Continue statin.  Last lipids good.

## 2010-08-28 NOTE — Progress Notes (Signed)
HPIMs. [name] is a 77 year old with a history of CAD (s/p CABG in 05/2008), hypertension, dyslipidemia and fibromyalgia.   Since I saw her she has done well from a cardiac standpoint.  No CP.  NO SOB.  Her biggest problem is her arthritis.  Today she is having a good day.   No Known Allergies  Current Outpatient Prescriptions  Medication Sig Dispense Refill  . amLODipine (NORVASC) 5 MG tablet Take 5 mg by mouth daily.        . Ascorbic Acid (VITAMIN C) 500 MG tablet Take 500 mg by mouth daily.        Marland Kitchen atorvastatin (LIPITOR) 40 MG tablet Take 40 mg by mouth at bedtime.        . B Complex Vitamins (VITAMIN B COMPLEX) TABS Take 1 tablet by mouth daily.        . calcium carbonate (OS-CAL) 600 MG TABS Take 600 mg by mouth daily. With Vit D       . carvedilol (COREG) 6.25 MG tablet Take 6.25 mg by mouth 2 (two) times daily with a meal.       . cetirizine (ZYRTEC) 10 MG tablet Take 10 mg by mouth daily.        Marland Kitchen Dexlansoprazole (DEXILANT) 60 MG capsule Take 60 mg by mouth daily.        . diclofenac sodium (VOLTAREN) 1 % GEL Apply topically as needed.        . metFORMIN (GLUMETZA) 500 MG (MOD) 24 hr tablet Take 500 mg by mouth 2 (two) times daily.        . Multiple Vitamin (MULTIVITAMIN) tablet Take 1 tablet by mouth daily.        . naproxen (NAPROSYN) 500 MG tablet Take 500 mg by mouth 2 (two) times daily with a meal.        . omega-3 acid ethyl esters (LOVAZA) 1 G capsule Take by mouth 2 (two) times daily.       . ramipril (ALTACE) 5 MG capsule Take 5 mg by mouth daily.        Marland Kitchen zolpidem (AMBIEN) 5 MG tablet Take 5 mg by mouth at bedtime as needed.        Marland Kitchen DISCONTD: ciclopirox (PENLAC) 8 % solution Apply topically at bedtime. Apply over nail and surrounding skin. Apply daily over previous coat. After seven (7) days, may remove with alcohol and continue cycle.       Marland Kitchen DISCONTD: HYDROcodone-acetaminophen (VICODIN) 5-500 MG per tablet Take 1 tablet by mouth. 1/2-1 tablet by mouth every 4 hours as  needed for severe pain       . DISCONTD: methocarbamol (ROBAXIN) 500 MG tablet Take 500 mg by mouth 4 (four) times daily as needed.        Marland Kitchen DISCONTD: Multiple Vitamins-Minerals (ZINC PO) Take by mouth daily.        Marland Kitchen DISCONTD: traMADol (ULTRAM) 50 MG tablet Take 50 mg by mouth. 1-2 tablets four times daily as needed for Arthritis pain         Past Medical History  Diagnosis Date  . S/P CABG x 1   . S/P inguinal hernia repair   . 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 DIABETES MELLITUS, TYPE II 01/17/2009  . HYPERLIPIDEMIA 06/15/2007  . UNSPECIFIED ANEMIA 10/15/2008  . HYPERTENSION 06/15/2007  . CAD, NATIVE VESSEL 06/09/2008  . GERD 06/15/2007  . HIATAL HERNIA 10/29/2000  . DIVERTICULOSIS, 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  . FIBROMYALGIA 06/15/2007  . COLONIC  POLYPS, HX OF 09/18/2008  . OA (osteoarthritis)     Past Surgical History  Procedure Date  . Hernia repair   . Abdominal hysterectomy 1981  . Coronary artery bypass graft 2010  . Appendectomy   . Tubal ligation   . Replacement total knee bilateral   . Rotator cuff repair     Left  . Ovarian cyst removal   . Cataract extraction, bilateral     Family History  Problem Relation Age of Onset  . Cancer Mother     Stomach  . Heart disease Father 33    MI  . Diabetes Sister   . Heart disease Sister   . Heart attack Brother     History   Social History  . Marital Status: Widowed    Spouse Name: N/A    Number of Children: N/A  . Years of Education: N/A   Occupational History  . Not on file.   Social History Main Topics  . Smoking status: Former Games developer  . Smokeless tobacco: Not on file   Comment: Widows, lives alone. 2 sons. 2 daughters. Retired Health visitor  . Alcohol Use: No  . Drug Use: No  . Sexually Active:    Other Topics Concern  . Not on file   Social History Narrative  . No narrative on file    Review of Systems:  All systems reviewed.  They are negative to the above problem except as previously  stated.  Vital Signs: BP 141/69  Pulse 65  Resp 18  Ht 4\' 11"  (1.499 m)  Wt 168 lb (76.204 kg)  BMI 33.93 kg/m2  Physical Exam Patient is in NAD   HEENT:  Normocephalic, atraumatic. EOMI, PERRLA.  Neck: JVP is normal. No thyromegaly. No bruits.  Lungs: clear to auscultation. No rales no wheezes.  Heart: Regular rate and rhythm. Normal S1, S2. No S3.   No significant murmurs. PMI not displaced.  Abdomen:  Supple, nontender. Normal bowel sounds. No masses. No hepatomegaly.  Extremities:   Good distal pulses throughout. No lower extremity edema.  Musculoskeletal :moving all extremities.  Neuro:   alert and oriented x3.  CN II-XII grossly intact.  EKG:  NSR.  65 bpm  Assessment and Plan:

## 2010-09-02 ENCOUNTER — Ambulatory Visit: Payer: Medicare Other | Admitting: Rehabilitative and Restorative Service Providers"

## 2010-09-02 NOTE — Assessment & Plan Note (Signed)
[name]                            CARDIOLOGY OFFICE NOTE   NAME:[name], [name]                     MRN:          [id number]  DATE:06/28/2008                            DOB:          08-18-1930    IDENTIFICATION:  [name] is a 77 year old woman who I just saw in  clinic.  She has a history of CAD is now status post CABG back on  February 2.  I saw her in clinic on 20th and she has also seen Dr.  Josie Flynn for her first postop check on March 1st.   She comes in today because she is now at home and she says she has  developed left chest pain radiating to her shoulder.  Pain is pleuritic.  She denies any cough productive of yellow sputum.  She brings up some  clear sputum.  She also is complaining of lower back pain as well.   CURRENT MEDICINES:  1. Simvastatin 40.  2. Metformin 500 b.i.d.  3. Ramipril 5.  4. Rozerem p.r.n.  5. Aspirin 325.  6. Coreg 6.25 b.i.d.   PHYSICAL EXAMINATION:  GENERAL:  On exam, the patient is in no distress  at rest.  VITAL SIGNS:  Blood pressure today is up a little low at 150/88, pulse  is 92 and regular, weight 162 up 1 pound.  Note, her pressure when I saw  her last was 114 and [name] office was 113 systolic.  LUNGS:  Clear without rales or wheezes.  CARDIAC:  Regular rate and rhythm.  S1 and S2.  No S3.  No significant  murmurs.  CHEST:  Mild tenderness in the left side, though different from the pain  she is having, able to bring about some of the pain though with moving  her left shoulder.  BACK:  Tender in low back.  EXTREMITIES:  No edema.   IMPRESSION:  Chest pain.  I am not convinced the pain is cardiac at all  was pleuritic in nature when I moved her shoulder which she actually has  in a somewhat frozen state.  Her pain gets much worse.  She does have a  history of fibromyalgia and osteoarthritis.  I think this is being  exacerbated may be at home she is trying to do more strain things.   PLAN:  1. I would like her to continue on her current medical regimen.  I      will call [name] office to try to get her an appointment      tomorrow to get seen.  I will keep her current appointment      otherwise.  2. CAD again as noted.  Continue regimen.  3. Dyslipidemia.  We will need to continue agents and followup lipids.     Susan Riffle, MD, [facility]  Electronically Signed    PVR/MedQ  DD: 06/28/2008  DT: 06/29/2008  Job #: [id number]

## 2010-09-02 NOTE — Assessment & Plan Note (Signed)
OFFICE VISIT   [name], [name]  DOB:  June 15, 1930                                        June 18, 2008  CHART #:  [id number]   The patient comes in today for postoperative followup.  She is status  post CABG x3 on May 22, 2008.  Her postoperative course was  uneventful and she was discharged home in good condition.  Following her  discharge, she has made slow and steady improvement.  She is currently  staying with her daughter and is progressing well.  Her appetite is  still a little sluggish, but she is eating fairly well.  Her breathing  has remained stable and she has not required any pain medication other  than Tylenol.  She is ambulating 3-4 times a day at least 15 minutes per  session.  She saw Dr. Tenny Craw last week and was given a good report.  None  of her medications have changed.  Overall, she feels that she is  progressing nicely.   PHYSICAL EXAMINATION:  VITAL SIGNS:  Blood pressure is 113/70, pulse is  86, respirations 18, and O2 sat 92% on room air.  CHEST:  Her incisions have all healed well.  Her sternum is stable.  HEART:  Regular rate and rhythm without murmurs, rubs, or gallops.  LUNGS:  Clear.  EXTREMITIES:  Without edema.   Chest x-ray shows minimal effusions and significantly improved  atelectasis.   ASSESSMENT AND PLAN:  The patient is recovering well, status post  coronary artery bypass graft.  She is released at this point to begin  driving.  She also plans to return home to living independently and I  feel that this will be fine.  Her children are close by and can help  taking care  of her if needed.  We will see her back as needed for followup.  She  plans to begin cardiac rehabilitation this week.   Sheliah Plane, MD  Electronically Signed   GC/MEDQ  D:  06/18/2008  T:  06/18/2008  Job:  [id number]   cc:   Pricilla Riffle, MD, [facility]

## 2010-09-02 NOTE — Assessment & Plan Note (Signed)
[facility]                         GASTROENTEROLOGY OFFICE NOTE   NAME:[name], [name]                     MRN:          [id number]  DATE:02/11/2007                            DOB:          Jun 22, 1930    REASON FOR CONSULT:  Worsening chronic constipation with lower abdominal  pain and weight loss.   HISTORY OF PRESENT ILLNESS:  [name] is a 76 year old African-  American female that I have evaluated in the past.  I saw her in 2003  for constipation, lower abdominal cramping, and intermittent dysphagia.  She was found to have GERD with a hiatal hernia, diverticulosis,  internal hemorrhoids, and constipation.  She states she has had a  lifelong problem with constipation and it has worsened substantially  over the past four months.  She has been taking increasing doses of  Tramadol to manage arthritis symptoms for the past several months.  She  notes significant gas and bloating.  Her heartburn and reflux symptoms  are well-controlled on Prevacid.  She has noted a slight decrease in  appetite and has noted about an 18-pound weight loss over the past four  months.  She has a sister with colon polyps and a niece and nephew with  Crohn's disease.  No family history of colon cancer.  She notes no  change in her stool caliber, melena, hematochezia, nausea, or vomiting.   CURRENT MEDICATIONS:  Listed on the chart updated and reviewed.   ALLERGIES:  None known.   PAST MEDICAL HISTORY:  1. Hypertension.  2. Diabetes mellitus.  3. Hyperlipidemia.  4. Arthritis.  5. Fibromyalgia.  6. Diverticulosis.  7. Internal hemorrhoids.  8. GERD.  9. Hiatal hernia.  10.Status post inguinal hernia repair.  11.Status post hysterectomy.  12.Status post appendectomy.  13.Status post bilateral tubal ligation.  14.Status post bilateral knee replacements.  15.Status post shoulder surgery.   SOCIAL HISTORY/REVIEW OF SYSTEMS:  Per the handwritten form.   PHYSICAL EXAMINATION:  Overweight, no acute distress.  Height 4 feet 11 inches, weight 158.8 pounds, blood pressure 132/60,  pulse 100 and regular.  HEENT:  Anicteric sclerae.  Oropharynx clear.  CHEST:  Clear to auscultation bilaterally.  CARDIAC:  Regular rate and rhythm without murmurs appreciated.  ABDOMEN:  Soft with minimal lower abdominal tenderness to deep  palpation.  No rebound or guarding.  No palpable organomegaly, masses,  or hernias.  Normal active bowel sounds.  RECTAL:  Deferred to time of colonoscopy.  EXTREMITIES:  Without clubbing, cyanosis, or edema.  NEUROLOGIC:  Alert and oriented x3.  Grossly nonfocal.   LABORATORY DATA:  CBC from January 17, 2007 was normal.  Comprehensive  metabolic panel from December 31, 2006 normal.   ASSESSMENT/PLAN:  1. Worsening chronic constipation with lower abdominal pain and      unexplained weight loss.  She will continue Colace 1-2 daily and      increase her fiber and fluid intake.  She will attempt to decrease      Tramadol usage.  Begin MiraLax 2-4 times a day, titrated for      adequate bowel movements.  Need to  exclude colorectal neoplasms.      Risks, benefits, and alternatives to colonoscopy with possible      biopsy and possible polypectomy discussed with the patient.  She      consents to proceed.  This will be scheduled electively.  If no      significant findings are noted at colonoscopy and her symptoms do      not adequately respond to the above measures, will plan to proceed      with a CT scan of the abdomen and pelvis for further evaluation.  2. Gastroesophageal reflux disease with a hiatal hernia.  Maintain      standard antireflux measures in Prevacid 30 mg p.o. q.a.m.  3. Diverticulosis.  Long term high fiber diet with adequate fluid      intake.     [name]. Russella Dar, MD, [facility]  Electronically Signed    MTS/MedQ  DD: 02/11/2007  DT: 02/13/2007  Job #: [id number]   cc:   Tammy R. Collins [place], M.D.

## 2010-09-02 NOTE — Consult Note (Signed)
NAMELEELOO, SILVERTHORNE              ACCOUNT NO.:  [id number]   MEDICAL RECORD NO.:  [id number]          PATIENT TYPE:  INP   LOCATION:  3733                         FACILITY:  [facility]   PHYSICIAN:  Pricilla Riffle, MD, FACCDATE OF BIRTH:  [birth year]   DATE OF CONSULTATION:  05/17/2008  DATE OF DISCHARGE:                                 CONSULTATION   PRIMARY CARDIOLOGIST:  Pricilla Riffle, MD, [facility]   PRIMARY MEDICAL DOCTOR:  Tammy R. Collins Scotland, MD   CHIEF COMPLAINT:  Chest pain.   HISTORY OF PRESENT ILLNESS:  For approximately 1 week prior to admission  on May 16, 2008, the patient had lower left-sided squeezing chest  pain with exertion.  Pain did not radiate nor was associated with  shortness of breath, nausea, vomiting, diaphoresis, or dizziness.  The  patient did experience palpitations (heart pounding) and felt fatigued  during these episodes.  Pain would resolve with rest after approximately  20 minutes.  On the day of admission, however, the patient had 10/10  chest pain that lasted for approximately 6 hours.  The significant  increase in severity and duration of pain prompted her daughter to  called EMS.  Pain had resolved by the time she was evaluated in the  emergency department prior to receiving any medications.   PAST MEDICAL HISTORY:  1. Diabetes mellitus.  2. Hypertension.  3. Hyperlipidemia.  4. Arthritis.  5. Fibromyalgia.  6. Diverticulosis.  7. GERD.  8. Hiatal hernia.  9. Internal hemorrhoids.   SOCIAL HISTORY:  The patient lives in [place] by herself.  She is  retired.  She has a remote history of 15-pack year smoking greater than  30 years ago.  She consumes less than 1 alcoholic beverage per week.  No  illicit drugs.  She takes some herbal supplements, fish oil, vitamin D,  and calcium as well as multivitamins.  She limits the amount of sugar  and salt in her diet and exercises regularly between 3-4 times per week.   FAMILY HISTORY:  Mother is  deceased, ? age and stomach cancer.  Father  deceased at age 53 from myocardial infarction.  She has 3 sisters living  with coronary artery disease and 1 brother living who had a myocardial  infarction ? Age.   REVIEW OF SYSTEMS:  As described in HPI.  Also had one episode of  unilateral headache during one of her chest pain episodes that lasted  for a portion of the duration of the chest pain.  This was the first of  such type headaches that she has ever experienced.  The patient also  reports coughing over the last week or so, minimally productive along  with a burning chest pain significantly different from pain described in  HPI and also different from her normal GERD sometimes.  The patient has  significant increase in stress lately and has felt especially depressed.  However, has suicidal ideation.  All other systems were reviewed and  were negative.   ALLERGIES:  NKDA.   MEDICATIONS:  1. Amlodipine 10 mg p.o. daily.  2. Aspirin 325 mg  p.o. daily.  3. Lovenox 75 mg subcu injection q.12 h.  4. Metoprolol 12.5 mg p.o. q.12 h.  5. Nitro paste 1 inch q.6 h.  6. Protonix 40 mg p.o. daily.  7. Ramipril 2.5 mg p.o. daily.  8. Simvastatin 40 mg p.o. daily and p.r.n. medications for nausea and      pain.   PHYSICAL EXAMINATION:  VITAL SIGNS:  Temperature 98.1 degrees  Fahrenheit, pulse 68, respiratory rate 20, BP 140/82, and O2 saturation  was 97% on room air.  GENERAL:  She was alert and oriented x3 in no apparent distress.  The  patient could move and speak easily during the exam without respiratory  distress.  HEENT:  Her head was normocephalic, atraumatic, and pupils were equal,  round, and reactive to light.  Nares were patent without discharge.  Dentition was good.  Oropharynx was without erythema or exudate.  NECK:  Supple without lymphadenopathy, no thyromegaly, no significant  JVD, and no bruits.  CARDIAC:  Heart rate was regular with audible S1 and S2.  No clicks,   rubs, murmurs, or gallops.  Pulses were 2+ and equal in both upper and  lower extremities bilaterally.  LUNGS:  Clear to auscultation bilaterally.  SKIN:  No rashes, lesions, or petechiae.  ABDOMEN:  Soft, nontender, and normal abdominal bowel sounds.  No  rebound or guarding.  No hepatosplenomegaly.  No pulsations.  EXTREMITIES:  No clubbing, cyanosis, or edema.  MUSCULOSKELETAL:  No joint deformity effusions.  No spinal or CVA  tenderness.  NEUROLOGIC:  Cranial II-XII were grossly intact.  Strength 5/5 in all  extremities and axial groups with normal sensation throughout and normal  cerebellar function.   CHEST X-RAY:  No active lung disease.   EKG:  Sinus rhythm with a rate of 71, minimal J point elevation in V2,  V3-V5, and no other acute ST-T wave changes.  No Q-waves, left axis, PR  164, QRS 84, QTC 443, meets criteria for LVH.  No significant changes  from EKG performed on May 16, 2008.   LABORATORY DATA:  WBC 7.9, HGB 13.1, hematocrit HCT 39.5, and PLT 225.  Sodium 138, potassium 3.8, chloride 105, CO2 24, BUN 10, creatinine  0.80, glucose 129, and calcium 9.1.  First set of cardiac markers  minimally elevated with CK-MB 4.2 and troponin-I 0.17.  Second set of  cardiac markers up trending CK 141.  CK-MB 6.6 and troponin-I 0.39.  Third set ? trend due do missing troponin-I, CK 126, and CK-MB 3.9.   ASSESSMENT ADN PLAN:  1. This is a 77 year old African American female with a history of      diabetes mellitus, hypertension, and hyperlipidemia with a family      history of coronary artery disease presenting with 1-week history      of chest pain associated with exertion.  Yesterday was the worst      spell 10/10 pain, negative for clear EKG and cardiac enzymes      minimally positive.  With the above findings, we would recommend      left heart cath to determine the significance of probable coronary      artery disease.  Risks and benefits were explained in detail to the       patient and the patient was agreeable to the procedure to plan for      tomorrow.  2. Hypertension.  We would recommend decreasing clonidine to 0.1 mg      daily and to control  blood pressure with beta-blocker instead.      Increasing Lopressor 25 mg p.o. b.i.d. and Altace to 2.5 mg p.o.      daily.  3. Hyperlipidemia ?  How well this is controlled, we will check in the      a.m.      Jarrett Ables, [facility]      Pricilla Riffle, MD, [facility]  Electronically Signed    MS/MEDQ  D:  05/17/2008  T:  05/18/2008  Job:  [id number]

## 2010-09-02 NOTE — Discharge Summary (Signed)
NAMEBRYLEIGH, [name]              ACCOUNT NO.:  [id number]   MEDICAL RECORD NO.:  [id number]          PATIENT TYPE:  INP   LOCATION:  2025                         FACILITY:  [facility]   PHYSICIAN:  Sheliah Plane, MD    DATE OF BIRTH:  [birth year]   DATE OF ADMISSION:  05/16/2008  DATE OF DISCHARGE:                               DISCHARGE SUMMARY   FINAL DIAGNOSIS:  Critical coronary artery stenosis.   IN-HOSPITAL DIAGNOSES:  1. Acute blood loss anemia postoperatively.  2. Volume overload postoperatively.   SECONDARY DIAGNOSES:  1. Diabetes mellitus.  2. Hypertension.  3. Hyperlipidemia.  4. History of arthritis.  5. Questionable diagnosis of fibromyalgia.  6. Diverticulosis.  7. Gastroesophageal reflux disease.  8. Status post back surgery.  9. Status post bilateral knee replacements.  10.Status post left shoulder surgery after injury.   IN-HOSPITAL OPERATIONS AND PROCEDURES:  1. Cardiac catheterization with selective coronary angiography, left      heart catheterization, left ventriculogram, Star[employer] common      femoral artery closure.  2. Coronary artery bypass grafting x3 with the left internal mammary      to left anterior descending coronary artery, reverse saphenous vein      graft to first obtuse marginal, reverse saphenous vein graft to      posterior descending coronary artery with right leg endovein      harvesting.   HISTORY AND PHYSICAL AND HOSPITAL COURSE:  The patient is a 77 year old  female who presents with new-onset unstable angina symptoms with having  chest pain with every minimal exertion.  She was evaluated by Dr. Eden Emms  and underwent cardiac catheterization by Dr. Gala Romney.  This revealed  preserved LV function with severe three-vessel coronary artery disease  with greater than 85% proximal right coronary artery disease, proximal  circumflex coronary artery disease, LAD disease of greater than 80%.  Following cardiac catheterization, Dr. Tyrone Sage  was consulted.  Dr.  Tyrone Sage saw and evaluated the patient.  He discussed the patient  undergoing coronary artery bypass grafting.  Risks and benefits were  discussed with the patient.  The patient acknowledged understanding and  agreed to proceed.  Surgery was scheduled for May 22, 2008.  Preoperatively, the patient underwent bilateral carotid duplex  ultrasound which showed no significant ICA stenosis.  The patient  remained stable preoperatively.  For details of the patient's past  medical history and physical exam, please see dictated H&P.   The patient was taken to the operating room on May 22, 2008 where  she underwent coronary artery bypass grafting x3 using a left internal  mammary artery to left anterior descending coronary artery, reverse  saphenous vein graft to first obtuse marginal, reverse saphenous vein  graft to posterior descending coronary artery with right leg endovein  harvesting.  The patient tolerated this procedure well and was  transferred back to the Intensive Care Unit in stable condition.  Postoperatively, the patient was noted to be hemodynamically stable.  She was extubated evening of surgery.  Post extubation, the patient was  noted to be alert and oriented x4, neuro intact.  The patient's  postoperative course was pretty much unremarkable.  Postoperatively, all  drips were weaned and discontinued.  Blood pressure was noted to be  stable.  She was noted to be in normal sinus rhythm.  Chest x-ray on day  #1 was clear.  The patient had minimal drainage from chest tubes and  chest tubes were discontinued in normal fashion.  The patient was  started on Lasix postoperatively for volume overload.  Daily weights  were monitored.  Her hemoglobin and hematocrit were noted to be slightly  low on postop day #1 at 9.0 and 27.5, this was followed.  While in the  Intensive Care Unit, the patient was tolerating diet well and was  restarted on her metformin.  Lasix  was discontinued.  The patient was  working with cardiac rehab in the Intensive Care Unit.  She was felt to  be stable and transferred to 2000 on postop day #2.  The patient  continued to progress well.  Heart rate and blood pressure remained  stable.  She remained in normal rhythm.  The patient remained afebrile.  Repeat chest x-ray remained stable.  She was using her incentive  spirometer and able to be weaned off oxygen sating greater than 90% on  room air.  Daily weights continued to be monitored and the patient is  currently 3.9 kg above her preoperative weight.  The patient's  hemoglobin and hematocrit remained stable.  She continued to work well  with cardiac rehab, ambulating with assistance.  All incisions were  noted to be clean, dry, and intact and healing well.  The patient was  tolerating diet well.  No nausea  or vomiting noted prior to discharge  home.  Blood sugars were followed and stable on the patient's metformin.   On postop day #3, May 25, 2008, the patient is noted to be afebrile.  Vitals stable.  Most recent lab work showed white blood cell count 10.0,  hemoglobin is 10.0, hematocrit 29.2, and platelet count 176.  Sodium of  138, potassium 3.9, chloride of 102, bicarbonate 29, BUN of 10,  creatinine of 0.75, and glucose of 141.  PA and lateral chest x-ray  showed bilateral atelectasis, but stable.  The patient is felt to be  ready for discharge home within the next 24-48 hours pending she  remained stable.   FOLLOWUP APPOINTMENTS:  A followup appointment has been arranged with  Dr. Tyrone Sage for June 18, 2008, at 1:15 p.m.  The patient will need to  obtain PA and lateral chest x-ray 30 minutes prior to this appointment.  The patient will need to follow up with Dr. Eden Emms in 2 weeks.  She will  need to contact his office to make these arrangements.   ACTIVITY:  The patient is instructed no driving until released to do so,  no lifting over 10 pounds.  She is  told to ambulate 3-4 times per day,  progress as tolerated and continue her breathing exercises.   DIET:  The patient is educated on diet to be low fat, low salt as well  as diabetic diet.   INCISIONAL CARE:  The patient is told to shower, washing her incisions  using soap and water.  She is to contact the office if she develops any  drainage or opening from any of her incision sites.   DISCHARGE MEDICATIONS:  1. Simvastatin 40 mg daily.  2. Metformin 500 mg b.i.d.  3. Ramipril 5 mg daily.  4. Wellbutrin daily.  5. Rozerem 8  mg at night.  6. Aspirin 325 mg daily.  7. Lasix 40 mg daily x5 days.  8. Potassium chloride 20 mEq daily x5 days.  9. Coreg 6.25 mg b.i.d.  10.Oxycodone 5 mg 1-2 tabs q.4-6 h. p.r.n.      Theda Belfast, [place]      Sheliah Plane, MD  Electronically Signed    KMD/MEDQ  D:  05/25/2008  T:  05/25/2008  Job:  [id number]   cc:   Pricilla Riffle, MD, [facility]

## 2010-09-02 NOTE — Assessment & Plan Note (Signed)
[facility]                            CARDIOLOGY OFFICE NOTE   NAME:[name]                     MRN:          [id number]  DATE:06/09/2008                            DOB:          1930-05-25    IDENTIFICATION:  [name] is a 77 year old woman who I saw in [facility].  We were consulted initially.  The patient was admitted  on January 27 because of chest pain.  She had slight elevation of her  troponins 0.17 peak.  With her risk factor, she underwent cardiac  catheterization.  This was done on January 29 that showed a 80% mid LAD  lesion, 50% distal circumflex, had a 99% mid lesion, 80% OM1 lesion.  The RCA was dominant.  There was an 80% calcified proximal lesion.  LVEF  was 50%.  An echocardiogram was also done during this admission that  showed normal LV systolic function.   The patient underwent coronary artery bypass grafting by Dr. Tyrone Sage  (LIMA to LAD; SVG to OM1; SVG to PDA).  She was discharged home about 5  days later.   Since discharge, she has been doing okay.  She denies fevers, chills.  No significant chest pain.  She is slowly increasing her activity.   ALLERGIES:  None.   MEDICATIONS:  1. Simvastatin 40.  2. Metformin 500 b.i.d.  3. Ramipril 5.  4. Wellbutrin question dose daily.  5. Rozerem 8 mg nightly.  6. Aspirin 325.  7. Coreg 6.25 b.i.d. (the patient's finished Lasix and potassium).   PAST MEDICAL HISTORY:  1. CAD.  2. Dyslipidemia.  3. Diabetes.  4. GU reflux.  5. Arthritis.  6. History of diverticulosis.   PAST SURGICAL HISTORY:  Back surgery, bilateral knee replacements, left  shoulder surgery.   PHYSICAL EXAMINATION:  GENERAL:  On exam, the patient is in no distress.  VITAL SIGNS:  Blood pressure is 114/62, pulse was 85 and regular, weight  161.  NECK:  JVP is normal.  No bruits.  LUNGS:  Clear with cough.  CARDIAC:  Regular rate and rhythm, S1, S2.  No S3.  No murmurs.  CHEST:   Scar healing well.  ABDOMEN:  Supple, nontender.  Normal bowel sounds.  EXTREMITIES:  No significant edema.   A 12-lead EKG shows normal sinus rhythm 85 beats per minute.  T-wave  inversion in leads V1-V3.   IMPRESSION:  [name] is a 77 year old woman now status post coronary  artery bypass grafting.  She is healing very well and looks good today.   1. I would recommend continuing her on her current medicines for now.      We will follow her blood pressure and make dose adjustments as      tolerated.  She denies dizziness.  2. Dyslipidemia.  We will need to have her lipids checked in a few      months and aggressively follow.  3. Diabetes on oral agents followed by primary.  The patient would      like a referral to primary.  I have given her some names.  4.  I will see the patient back in 2 months.  She is due to see Dr.      Tyrone Sage in 2 weeks.  I will also put in referral for cardiac      rehab.     Pricilla Riffle, MD, [facility]  Electronically Signed    PVR/MedQ  DD: 06/09/2008  DT: 06/09/2008  Job #: [id number]   cc:   Sheliah Plane, MD

## 2010-09-02 NOTE — H&P (Signed)
[name], [name]              ACCOUNT NO.:  [id number]   MEDICAL RECORD NO.:  [id number]          PATIENT TYPE:  INP   LOCATION:  3733                         FACILITY:  [facility]   PHYSICIAN:  Carlena Hurl, MDDATE OF BIRTH:  March 12, 1931   DATE OF ADMISSION:  05/16/2008  DATE OF DISCHARGE:                              HISTORY & PHYSICAL   CHIEF COMPLAINT:  On and off chest pain for the past 1 week.   HISTORY OF PRESENT ILLNESS:  This is a 77 year old very pleasant African  American female who has a past medical history significant for diabetes,  hypertension, and hyperlipidemia, who was brought here to the ER by her  daughter because of a 1-week history of chest pain.  The whole history  was obtained from the patient herself.  According to her, she started  having this chest pain about 1 week ago.  She first noticed her chest  pain after she was at a swimming pool.  She says that she usually  exercises very regularly, and she swims as well as she lifts weights and  uses a treadmill 3-4 times a week but about 1 week ago, when she was at  a swimming pool, after about a few minutes, she noted a chest pain,  which she said it was just tightness and kind of burning type of pain  and which went away after she took rest and over the past couple of days  the same thing happened.  Whenever she tries to exercise, she is getting  this chest tightness and relief with rest.  This patient never had any  cardiac problems in the past, never had any cardiac workup.  As she was  having some cough along with this chest tightness, she went to see her  primary care physician where she was given a Z-Pak, and she said that  her cough did improve, and her pain also improved a little bit after  finishing that Z-Pak.  But again, for the past 1 day, she continued to  have this chest pain whenever she exerts herself.  That is why, they  have decided to come to the ER.  There is no associated nausea  or  vomiting.  No diaphoresis, and there is no history of headache,  dizziness.  No abdominal pain.  No diarrhea.  No weight loss.   PAST MEDICAL HISTORY:  Significant for:  1. Diabetes.  2. Hypertension.  3. Hyperlipidemia.  4. Arthritis.  5. Fibromyalgia.  6. Diverticulosis.  7. GERD.  8. Hiatal hernia.  9. Internal hemorrhoids.   PAST SURGICAL HISTORY:  Significant for bilateral knee replacements.  She had hysterectomy, appendectomy, inguinal hernial repair, bilateral  tubal ligation, and rotator cuff repair of her left shoulder.   ALLERGIES:  No known drug allergies.   SOCIAL HISTORY:  The patient lives alone and she is pretty active and  takes care of herself.  Denies alcohol, IV drug abuse, and smoking.   FAMILY HISTORY:  Significant for diabetes and her mother had some kind  of stomach cancer and her nieces had history of breast cancer.  MEDICATIONS AT HOME:  1. Simvastatin 40 mg p.o. 1 time a day.  2. Metformin 500 mg p.o. 2 times a day.  3. Cetirizine 10 mg p.o. 1 time a day.  4. Prevacid 30 mg p.o. 1 time a day.  5. Clonidine 0.5 mg 2 times a day.  6. Ramipril 2.5 mg p.o. 1 time a day.  7. Amlodipine 10 mg p.o. 1 time a day.   REVIEW OF SYSTEMS:  Significant for on and off chest pain for the past 1  week and no fever.  No weight loss.  Rest of the systems not  significant.   PHYSICAL EXAMINATION:  GENERAL:  This is a 77 year old, very pleasant  lady who is lying comfortably on the bed without any acute severe chest  pain and no severe shortness of breath.  VITAL SIGNS:  When she came into the ER, blood pressure 112/54, pulse  rate of 73, respirations 16, and temperature 97.9.  The patient is  saturating 100% on room air.  HEENT:  Head is atraumatic and normocephalic.  Pupils round and  reactive.  NECK:  Supple.  No JVD noted.  No goiter appreciated.  LUNGS:  Clear to auscultation bilaterally.  CVS:  S1 and S2 heard with low-pitch systolic murmur with no  radiation.  No gallops.  No rubs appreciated.  ABDOMEN:  Soft.  Bowel sounds present.  Nontender and nondistended.  EXTREMITIES:  No pedal edema noted.  No cyanosis and pulses palpable  bilaterally.  CNS:  Awake, alert, and oriented x3.  No focal neurological deficits  noted.   LABORATORY DATA:  WBC of 7.0, hemoglobin 13.2, hematocrit 39.6, and  platelets of 230.  Sodium 139, potassium 4.1, chloride 105, bicarb 25,  glucose 97, BUN 11, and creatinine 0.69.  LFT, AST, ALT, and alk phos  are within normal limits.  Calcium of 9.3 and albumin of 3.7.  First set  of troponin I is 0.11 and second set of troponin, 2 hours later is 0.17.  First set of CK-MB is 5.8 and second set is 4.2.  Myoglobin is 89.7.  Urinalysis shows wbc 3-6 and rest of urinalysis is pretty benign except  for leukocyte esterase of moderate, but nitrite is negative.  The chest  x-ray showed no active cardiopulmonary disease.   ASSESSMENT AND PLAN:  This is a 77 year old African American female with  a significant past medical history of diabetes, hypertension, and  hyperlipidemia, who is coming in with on and off chest pain, which is  exertional and there is minimal elevation of troponins.  1. Stable angina.  At this time, as the patient gives the typical      history of anginal-type of chest pain, which comes on with exertion      and relieves with rest and so far, there is a very insignificant      elevation of troponins and EKG has also no significant ST-T      changes.  So we are going to get the third set of cardiac enzymes      as well and before discharge, the patient will be getting a 2-D      echo as well as a stress test because of her significant risk      factors, and she will be started on aspirin 325 mg 1 time a day and      we will risk stratify for the rest of her medical conditions, that      is hyperlipidemia, high  blood pressure, and diabetes.  2. History of diabetes.  The patient will be continued on  her home      medication of metformin.  3. High blood pressure.  The patient's blood pressure has been stable      here.  So, continue her home dose of medication.  4. Hyperlipidemia.  The patient will be continued on her medications.  5. History of gastroesophageal reflux disease.  We will give her      Protonix 40 mg p.o. 1 time a day.  6. Deep vein thrombosis prophylaxis.  The patient will be started on      Lovenox 40 mg subcu 1 time a day.   DISPOSITION:  To home once the patient is clinically stable.      Carlena Hurl, MD  Electronically Signed     JD/MEDQ  D:  05/16/2008  T:  05/17/2008  Job:  [id number]

## 2010-09-02 NOTE — Op Note (Signed)
[name], [name]              ACCOUNT NO.:  [id number]   MEDICAL RECORD NO.:  [id number]          PATIENT TYPE:  INP   LOCATION:  2015                         FACILITY:  [facility]   PHYSICIAN:  Sheliah Plane, MD    DATE OF BIRTH:  04/24/1930   DATE OF PROCEDURE:  05/22/2008  DATE OF DISCHARGE:                               OPERATIVE REPORT   PREOPERATIVE DIAGNOSIS:  Critical coronary artery stenosis.   POSTOPERATIVE DIAGNOSIS:  Critical coronary artery stenosis.   SURGICAL PROCEDURES:  Coronary artery bypass grafting x3 with the left  internal mammary to the left anterior descending coronary artery,  reverse saphenous vein graft to the first obtuse marginal, reverse  saphenous vein graft to the posterior descending coronary artery with  right leg endovein harvesting.   SURGEON:  Sheliah Plane, MD   FIRST ASSISTANT:  Coral Ceo, PA   BRIEF HISTORY:  The patient is a 77 year old female who presents with  new onset of unstable anginal symptoms with having chest pain with very  minimal exertion.  She was evaluated by Dr. Eden Emms and underwent cardiac  catheterization by Dr. Gala Romney, which revealed preserved LV function  with severe three-vessel coronary artery disease with greater than 85%  proximal right coronary artery disease, proximal circumflex coronary  artery disease, LAD disease of greater than 80%.  Because of the  patient's severe three-vessel disease not amendable to angioplasty,  coronary artery bypass grafting was recommended.  The patient agreed and  signed informed consent.   DESCRIPTION OF PROCEDURE:  With Swan-Ganz and arterial line monitors in  place, the patient underwent general endotracheal anesthesia without  incident.  Skin of chest and legs was prepped with Betadine and draped  in the usual sterile manner.  Using the [employer] endovein harvesting  system, vein was harvested from the right thigh and was of good quality  and caliber.  Median sternotomy  was performed.  Left internal mammary  artery was dissected down as pedicle graft.  The distal artery was  divided, had a good free flow.  Pericardium was opened.  Overall  ventricular function appeared preserved.  The patient was systemically  heparinized.  Ascending aorta was cannulated.  The right atrium was  cannulated.  The patient was placed on cardiopulmonary bypass 2.4 liters  per minute per meter square.  Sites of anastomosis were selected and  dissected out of the epicardium.  The patient's body temperature was  cooled to 32 degrees.  Aortic crossclamp was applied.  A 500 mL of cold  blood potassium cardioplegia was administered with diastolic arrest of  the heart.  Myocardial septal temperature was monitored throughout the  crossclamp.  The heart was elevated and the first obtuse marginal  coronary artery was easily identified and opened and admitted a 1.5 mm  probe.  Distal branch of the vessel was very small less than 1 mm and  was thought too small to bypass.  The first obtuse marginal artery was  opened and a 1.5-mm probe passed distally.  Using a running 8-0 Prolene,  left internal mammary artery was anastomosed to left anterior descending  coronary artery.  Attention was then turned to the posterior descending  coronary artery, which was opened and admitted a 1.5-mm probe.  Using a  running 7-0 Prolene, distal anastomosis was performed.  Attention was  then turned to the left anterior descending coronary artery between the  mid and distal portion of the vessel.  The artery was opened and  admitted a 1.5-mm probe.  Using running 8-0 Prolene, left internal  mammary artery was anastomosed to the left anterior descending coronary  artery.  With the distal vessels, anastomosis completed with crossclamp  still in place, two punch aortotomies were performed and each of the two  vein grafts were anastomosed to the ascending aorta.  Air was evacuated  from the grafts and the aortic  crossclamp was removed with total  crossclamp time was 62 minutes.  Sites anastomosed were inspected free  of bleeding.  The patient was then ventilated and weaned from  cardiopulmonary bypass without difficulty.  She remained hemodynamically  stable.  She was decannulated in usual fashion.  Protamine sulfate was  administered with operative field hemostatic.  Two atrial and two  ventricular pacing wires were applied.  Graft markers were applied.  Left pleural tube and a Blake mediastinal drain were left in place.  Sternum was closed with #6 stainless steel wire.  Fascia was closed with  interrupted 0 Vicryl running, 3-0 Vicryl in subcutaneous tissue, 4-0  subcuticular stitch in skin edges.  Dry dressings were applied.  Sponge  and needle count was reported as correct at the completion of the  procedure.  The patient tolerated the procedure without obvious  complication and was transferred to surgical intensive care unit for  further postoperative care.      Sheliah Plane, MD  Electronically Signed     EG/MEDQ  D:  05/24/2008  T:  05/25/2008  Job:  [id number]   cc:   Pricilla Riffle, MD, [facility]

## 2010-09-02 NOTE — Consult Note (Signed)
NAMEJARA, [name]              ACCOUNT NO.:  [id number]   MEDICAL RECORD NO.:  [id number]          PATIENT TYPE:  INP   LOCATION:  3733                         FACILITY:  [facility]   PHYSICIAN:  Sheliah Plane, MD    DATE OF BIRTH:  Feb 19, 1931   DATE OF CONSULTATION:  DATE OF DISCHARGE:                                 CONSULTATION   REQUESTING PHYSICIAN:  Bevelyn Buckles. Bensimhon, MD   FOLLOWUP CARDIOLOGIST:  Bevelyn Buckles. Bensimhon, MD   PRIMARY CARE PHYSICIAN:  Tammy R. Collins Scotland, MD   REASON FOR CONSULTATION:  The patient with coronary artery disease.   HISTORY OF PRESENT ILLNESS:  The patient is a 77 year old African  American female who has a known history of hypertension, diabetes,  hyperlipidemia who is brought to the emergency room on May 16, 2008,  by her daughter who is a psychiatrist because of chest pain.  She noted  that she does exercise regularly and over the past 2-3 weeks began  having increasing episodes of substernal chest burning with exertion  which over this time period it become increasingly frequent and more  severe.  She had no previous cardiac problems.  Earlier in the week, she  was seen in primary care office and was given a Z-PAK, however, this did  not seem to help the pain and she came to the emergency room.  On  admission her CK-MB was 5.8, then 4.2.  First troponin was 0.11 and  second was 0.17.  She noticed no prior history of myocardial infraction,  angioplasty, or previous cardiac evaluation.  She also has a history of  hypertension, hyperlipidemia, 6 years of diabetes, is unaware of her  hemoglobin A1c.  She is remote smoker, but quit in 1970s.  She does have  a family history of both diabetes and cardiac disease.  She has had no  previous stroke, denies claudication.   PAST MEDICAL HISTORY:  Significant for,  1. Diabetes.  2. Hypertension.  3. Hyperlipidemia.  4. History of arthritis.  5. Questionable diagnosis of fibromyalgia.  The patient's  chart notes      this, but she does not mention it.  6. History of diverticulosis.  7. GERD.   PAST SURGICAL HISTORY:  1. Back surgery.  2. Bilateral knee replacement.  3. Left shoulder surgery after injury.   SOCIAL AND FAMILY HISTORY:  The patient is widowed, lives alone.  She is  a retired [employer] information [employer] manager.  She denies alcohol use.   FAMILY HISTORY:  She is 1 of 10 children.  One sister died in her 21s of  diabetes and heart disease, one brother died of myocardial infraction  and diabetes at age 33.  Mother died of stomach cancer, father at age 96  of myocardial infraction.   ALLERGIES:  She has no known drug allergies.   MEDICATIONS:  1. Amlodipine 10 mg a day.  2. Aspirin 325 mg a day.  3. Lovenox.  4. Metoprolol 12.5 mg q.12 h.  5. Nitropaste 1 inch q.6 h.  6. Protonix 40 mg a day.  7. Ramipril 2.5 mg a day.  8. Simvastatin 40 mg a day.   CARDIAC REVIEW OF SYSTEMS:  Positive for palpitations, chest pain,  exertion, shortness of breath.  No nocturnal angina.  She denies  orthopnea, presyncope, syncope, resting shortness of breath, or lower  extremity edema.   GENERAL REVIEW OF SYSTEMS:  Denies constitutional symptoms.  Denies  hemoptysis.  Does have exertional shortness of breath.  She had no  change in bowel habits but is bothered by chronic constipation.  She has  had history of colonoscopy done in the past.  She noticed her  pneumococcal and flu vaccinations are up to date.  She does have  musculoskeletal arthritic pains and has had both knee replaced.   PHYSICAL EXAMINATION:  VITAL SIGNS:  She is afebrile.  Blood pressure  140/80, pulse is 68, respiratory rate is 20.  She is 4 feet 11 inches  tall, 107 pounds.  GENERAL:  The patient is awake, alert, neurologically intact.  Does  complain of headache, seems she had nitroglycerin paste on.  NECK:  She has no carotid bruits.  LUNGS:  Clear bilaterally.  CARDIAC:  Regular rate and rhythm without murmur,  gallop.  ABDOMEN:  Benign without palpable mass.  She has right groin closure  device in and without evidence of hematoma.  EXTREMITIES:  She does have some tenderness along the left pretibial  leg.  There is no definite obvious varicose veins.  She has +1 DP and PT  pulses bilaterally.   LABORATORY DATA:  Laboratory findings are reviewed.  Hematocrit is 39.5,  hemoglobin 13.0, white count 7.9 on admission.  Creatinine is 0.8 on  admission.  First troponin 0.17.   Cardiac catheterization films are reviewed and discussed with Dr.  Gala Romney.  The patient has 17% LAD lesion, 19% circumflex lesion, 80%  first OM lesion, 80% proximal LAD with 50% ejection fraction.   PLAN:  The patient with subendocardial myocardial infraction with  positive enzymes and progressive unstable anginal symptoms with diabetes  and significant 3-vessel disease.  The patient would best be served with  coronary  artery bypass grafting.  The risks of surgery including death,  infection, stroke, myocardial infraction, bleeding, blood transfusion  all discussed with the patient, her daughter, and son-in-law in detail.  She will stay in the hospital until proceed with the surgery early next  week.  Currently, we will make arrangements for February 2, Tuesday.      Sheliah Plane, MD  Electronically Signed     EG/MEDQ  D:  05/18/2008  T:  05/19/2008  Job:  [id number]   cc:   Bevelyn Buckles. Bensimhon, MD  Tammy R. Collins Scotland, M.D.  Pricilla Riffle, MD, [facility]

## 2010-09-02 NOTE — Cardiovascular Report (Signed)
NAMEKOLINA, KUBE              ACCOUNT NO.:  [id number]   MEDICAL RECORD NO.:  [id number]          PATIENT TYPE:  INP   LOCATION:  3733                         FACILITY:  [facility]   PHYSICIAN:  Bevelyn Buckles. Bensimhon, MDDATE OF BIRTH:  [birth year]   DATE OF PROCEDURE:  05/18/2008  DATE OF DISCHARGE:                            CARDIAC CATHETERIZATION   PATIENT IDENTIFICATION:  Ms. Janvrin is a very pleasant 77 year old  woman with history of diabetes x 6 years, hypertension, hyperlipidemia  and fibromyalgia.  She has been having exertional chest pain for several  months.  She was admitted with very severe chest pain.  Cardiac enzymes  were mildly positive, consistent with non-ST elevation myocardial  infarction.  She is brought to the catheterization lab for diagnostic  angiography.   PROCEDURES PERFORMED:  1. Selective coronary angiography.  2. Left heart catheterization.  3. Left ventriculogram.  4. StarClose common femoral artery closure.   DESCRIPTION OF PROCEDURE:  The risks and indication of the  catheterization were explained.  Consent was signed and placed on the  chart.  A 5-French arterial sheath was placed in the right femoral  artery using a modified Seldinger technique.  Standard catheters,  including JL-4 and JR-4 and angled pigtail, were used for procedure.  All catheter exchanges made over a wire.  There were no apparent  complications.   Central aortic pressure was 146/78 with a mean of 109.  LV pressure  162/1 with an EDP of 10.  No aortic stenosis.   Left main was normal.   The LAD was a heavily calcified vessel.  It gave off three tiny  diagonals.  There is a 40% lesion in the proximal portion, an 80% lesion  in the midportion, and a 50% lesion following that in the midportion.   Left circumflex was also calcified.  It gave off a large, branching OM-  1, a small OM-2 in the mid AV groove circumflex.  At the takeoff of the  OM-1 there was a 99% calcified  lesion.  In the body of the OM-1 there is  an 80% lesion.  There was moderate diffuse disease throughout the distal  AV groove circumflex and OM-2.   Right coronary artery was a dominant vessel.  It gave off an RV branch  and acute marginal in the PDA.  There was an 80% calcified proximal  lesion, 40% tubular lesion throughout the midsection.   Left ventriculogram showed ejection fraction of approximately 50%.   ASSESSMENT:  1. Three-vessel coronary artery disease with calcified vessels.  2. Low-normal left ventricular ejection fraction.  3. Diabetes.   PLAN:  Will be for cardiothoracic surgery referral for evaluation for  possible bypass surgery.   DISPOSITION:  We will restart heparin 8 hours after her sheath pull.      Bevelyn Buckles. Bensimhon, MD  Electronically Signed     DRB/MEDQ  D:  05/18/2008  T:  05/18/2008  Job:  [id number]

## 2010-09-03 ENCOUNTER — Ambulatory Visit (INDEPENDENT_AMBULATORY_CARE_PROVIDER_SITE_OTHER): Payer: Medicare Other | Admitting: Internal Medicine

## 2010-09-03 ENCOUNTER — Encounter: Payer: Self-pay | Admitting: Internal Medicine

## 2010-09-03 DIAGNOSIS — R413 Other amnesia: Secondary | ICD-10-CM

## 2010-09-03 DIAGNOSIS — M25519 Pain in unspecified shoulder: Secondary | ICD-10-CM

## 2010-09-03 DIAGNOSIS — E119 Type 2 diabetes mellitus without complications: Secondary | ICD-10-CM

## 2010-09-03 DIAGNOSIS — I10 Essential (primary) hypertension: Secondary | ICD-10-CM

## 2010-09-03 NOTE — Patient Instructions (Signed)
It was good to see you today. We have reviewed your prior records including labs and tests today - your brain is ok! Good job with the physical therapy progress - keep it up! Please keep scheduled follow up as planned (3-4 months), call sooner if problems.

## 2010-09-03 NOTE — Progress Notes (Signed)
Subjective:    Patient ID: [name], female    DOB: Mar 01, 1931, 77 y.o.   MRN: [id number]  HPI  here for follow up memory loss concerns exac by situational stress of death of sister 2010/06/21 Noted also by her dtr and friends- No $ errors or driving errors but poor concentration and short term recall Labs and MRI brain 07/2010 reviewed - no signficant abn - age appropriate atrophy  HTN - reports compliance with ongoing medical treatment and no changes in medication dose or frequency. denies adverse side effects related to current therapy.   dyslipidemia - reports compliance with ongoing medical treatment and no changes in medication dose or frequency. denies adverse side effects related to current therapy.   DM2 - reports compliance with ongoing medical treatment and no changes in medication dose or frequency. denies adverse side effects related to current therapy. no hypoglycemia or hyperglycemia  diffuse, severe arthritis both inflammatory and DJD, dx never clarified in past - follows with rheum - denies any dx or tx of RA or lupus or gout - participating in water areobics and PT- mobic changed to naprosyn - tolerable but constant pain with mild swelling in hands, wrists, knees and neck - also pain in both shoulders, declines shoulder replacment as rec by ortho.  uses hydrocodone sparingly  Past Medical History  Diagnosis Date  . S/P CABG x 1   . S/P inguinal hernia repair   . 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 DIABETES MELLITUS, TYPE II 01/17/2009  . HYPERLIPIDEMIA 06/15/2007  . UNSPECIFIED ANEMIA 10/15/2008  . HYPERTENSION 06/15/2007  . CAD, NATIVE VESSEL 06/09/2008  . GERD 06/15/2007  . HIATAL HERNIA 10/29/2000  . DIVERTICULOSIS, 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  . FIBROMYALGIA 06/15/2007  . COLONIC POLYPS, HX OF 09/18/2008  . OA (osteoarthritis)     Review of Systems  Constitutional: Negative for fever.  Respiratory: Negative for shortness of breath.   Cardiovascular: Negative for  chest pain.  Neurological: Negative for speech difficulty, weakness and headaches.  Psychiatric/Behavioral: Negative for hallucinations, confusion, dysphoric mood and decreased concentration.       Objective:   Physical Exam  Constitutional: She is oriented to person, place, and time.  Neurological: She is alert and oriented to person, place, and time. She has normal strength. She is not disoriented. She displays no tremor. No cranial nerve deficit or sensory deficit. She displays a negative Romberg sign. Coordination and gait normal.  Psychiatric: She has a normal mood and affect. Her behavior is normal. Judgment and thought content normal. Her mood appears not anxious. Her affect is not inappropriate. Her speech is not tangential and not slurred. She is not agitated, not aggressive, not slowed, not withdrawn and not actively hallucinating. Thought content is not delusional. Cognition and memory are not impaired. She does not express impulsivity or inappropriate judgment. She does not exhibit a depressed mood. She expresses no suicidal plans. She exhibits normal recent memory and normal remote memory. She is attentive.   Neck: Normal range of motion. Neck supple. No JVD present. No thyromegaly present.  Cardiovascular: Normal rate, regular rhythm and normal heart sounds.  No murmur heard. Pulmonary/Chest: Effort normal and breath sounds normal. No respiratory distress. She has no wheezes.  BP 118/62  Pulse 63  Temp(Src) 98 F (36.7 C) (Oral)  Resp 16  Wt 166 lb (75.297 kg)  SpO2 97%    Lab Results  Component Value Date   TSH 0.88 07/23/2010   Lab Results  Component Value Date  [id number] 07/23/2010   Assessment & Plan:  See problem list. Medications and labs reviewed today. Time spent with pt today 25 minutes, greater than 50% time spent counseling patient on arthritis, memory loss concerns and medication review. Also review of prior records including MRI 07/2010

## 2010-09-04 ENCOUNTER — Ambulatory Visit: Payer: Medicare Other | Admitting: Rehabilitative and Restorative Service Providers"

## 2010-09-07 ENCOUNTER — Encounter: Payer: Self-pay | Admitting: Internal Medicine

## 2010-09-07 NOTE — Assessment & Plan Note (Signed)
End stage DJD of shoulders and inflamm arthropathy of hands/wrists - but improved with NSAIDs and PT -  continue activity and swimming as tolerated with NSAID therapy - F/u rheum and ortho as planned/as needed

## 2010-09-07 NOTE — Assessment & Plan Note (Signed)
Fair control - continue ongoing medical therapy as ongoing

## 2010-09-07 NOTE — Assessment & Plan Note (Signed)
The current medical regimen is effective;  continue present plan and medications. Lab Results  Component Value Date   HGBA1C 6.0 03/25/2010

## 2010-09-07 NOTE — Assessment & Plan Note (Signed)
Exam remains benign, symptoms improved - no evidence for depression 07/2010 results of tsh and b12 and other labs + mri brain reviewed - no abn Consider aricept - pt understands but declines at this time as situational grief improving (death of sister) and symptoms improved as well -

## 2010-09-09 ENCOUNTER — Ambulatory Visit: Payer: Medicare Other | Admitting: Physical Therapy

## 2010-09-11 ENCOUNTER — Encounter: Payer: Medicare Other | Admitting: Physical Therapy

## 2010-09-14 ENCOUNTER — Emergency Department (HOSPITAL_COMMUNITY)
Admission: EM | Admit: 2010-09-14 | Discharge: 2010-09-14 | Disposition: A | Discharge status: Home / Self Care | Payer: Medicare Other | Attending: Emergency Medicine | Admitting: Emergency Medicine

## 2010-09-14 DIAGNOSIS — IMO0001 Reserved for inherently not codable concepts without codable children: Secondary | ICD-10-CM | POA: Insufficient documentation

## 2010-09-14 DIAGNOSIS — E7800 Pure hypercholesterolemia, unspecified: Secondary | ICD-10-CM | POA: Insufficient documentation

## 2010-09-14 DIAGNOSIS — Z79899 Other long term (current) drug therapy: Secondary | ICD-10-CM | POA: Insufficient documentation

## 2010-09-14 DIAGNOSIS — R252 Cramp and spasm: Secondary | ICD-10-CM | POA: Insufficient documentation

## 2010-09-14 DIAGNOSIS — E119 Type 2 diabetes mellitus without complications: Secondary | ICD-10-CM | POA: Insufficient documentation

## 2010-09-14 DIAGNOSIS — I10 Essential (primary) hypertension: Secondary | ICD-10-CM | POA: Insufficient documentation

## 2010-09-14 LAB — POCT I-STAT, CHEM 8
BUN: 15 mg/dL (ref 6–23)
Calcium, Ion: 1.19 mmol/L (ref 1.12–1.32)
Chloride: 104 mEq/L (ref 96–112)
Creatinine, Ser: 1.1 mg/dL (ref 0.4–1.2)
Glucose, Bld: 99 mg/dL (ref 70–99)
HCT: 32 % — ABNORMAL LOW (ref 36.0–46.0)
Hemoglobin: 10.9 g/dL — ABNORMAL LOW (ref 12.0–15.0)
Potassium: 4.3 mEq/L (ref 3.5–5.1)
Sodium: 138 mEq/L (ref 135–145)
TCO2: 26 mmol/L (ref 0–100)

## 2010-09-16 ENCOUNTER — Encounter: Payer: Medicare Other | Admitting: Physical Therapy

## 2010-09-18 ENCOUNTER — Encounter: Payer: Medicare Other | Admitting: Physical Therapy

## 2010-09-23 ENCOUNTER — Ambulatory Visit: Payer: Medicare Other | Admitting: Internal Medicine

## 2010-11-03 ENCOUNTER — Ambulatory Visit
Admission: RE | Admit: 2010-11-03 | Discharge: 2010-11-03 | Disposition: A | Discharge status: Home / Self Care | Payer: Medicare Other | Source: Ambulatory Visit | Attending: Orthopedic Surgery | Admitting: Orthopedic Surgery

## 2010-11-03 ENCOUNTER — Other Ambulatory Visit: Payer: Self-pay | Admitting: Orthopedic Surgery

## 2010-11-03 ENCOUNTER — Telehealth: Payer: Self-pay | Admitting: *Deleted

## 2010-11-03 DIAGNOSIS — M75101 Unspecified rotator cuff tear or rupture of right shoulder, not specified as traumatic: Secondary | ICD-10-CM

## 2010-11-03 NOTE — Telephone Encounter (Signed)
Faxed surgical clearance request to Darl Pikes at Dr.Chandler's office at [phone]. I left her a message to make sure that she receives this and for her to call back to confirm.

## 2010-11-05 ENCOUNTER — Ambulatory Visit (INDEPENDENT_AMBULATORY_CARE_PROVIDER_SITE_OTHER): Payer: Medicare Other | Admitting: Internal Medicine

## 2010-11-05 ENCOUNTER — Encounter: Payer: Self-pay | Admitting: Internal Medicine

## 2010-11-05 VITALS — BP 110/72 | HR 70 | Temp 98.5°F | Ht 60.0 in | Wt 162.6 lb

## 2010-11-05 DIAGNOSIS — I10 Essential (primary) hypertension: Secondary | ICD-10-CM

## 2010-11-05 DIAGNOSIS — E119 Type 2 diabetes mellitus without complications: Secondary | ICD-10-CM

## 2010-11-05 DIAGNOSIS — Z01818 Encounter for other preprocedural examination: Secondary | ICD-10-CM

## 2010-11-05 NOTE — Progress Notes (Signed)
Subjective:    Patient ID: [name], female    DOB: 1930/12/13, 77 y.o.   MRN: [id number]  HPI  here for preop clearance - requested by Dr. Ave Filter - R shoulder replacement 7/1912no CP or SOB or edema - no DOE  Note hx CAD and DM; no CKD pt able to walk 2 blocks without fatigue (but limited by pain in knees)  Has exhausted all conservative med tx for arthritis and phys therapy - see below  Also reviewed chronic medical issues: HTN - reports compliance with ongoing medical treatment and no changes in medication dose or frequency. denies adverse side effects related to current therapy.   dyslipidemia - reports compliance with ongoing medical treatment and no changes in medication dose or frequency. denies adverse side effects related to current therapy.   DM2 - reports compliance with ongoing medical treatment and no changes in medication dose or frequency. denies adverse side effects related to current therapy. no hypoglycemia or hyperglycemia  diffuse, severe arthritis both inflammatory and DJD, dx never clarified in past - follows with rheum and ortho - denies any dx or tx of RA or lupus or gout - participating in water areobics and PT- mobic changed to naprosyn - tolerable but constant pain with mild swelling in hands, wrists, knees and neck - also pain in both shoulders, has finally consented to shoulder replacment as rec by ortho.  uses hydrocodone sparingly  Past Medical History  Diagnosis Date  . S/P CABG x 1   . S/P inguinal hernia repair   . 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 CAD, NATIVE VESSEL 06/09/2008  . UNSPECIFIED ANEMIA   . DIABETES MELLITUS, TYPE II   . GERD   . HYPERLIPIDEMIA   . HYPERTENSION   . HIATAL HERNIA   . FIBROMYALGIA   . OA (osteoarthritis)     severe, shoulders, hands - inflammatory, ?RA   Family History  Problem Relation Age of Onset  . Cancer Mother     Stomach  . Heart disease Father 60    MI  . Diabetes Sister   . Heart  disease Sister   . Heart attack Brother    History  Substance Use Topics  . Smoking status: Former Games developer  . Smokeless tobacco: Not on file   Comment: Widows, lives alone. 2 sons. 2 daughters. Retired Health visitor  . Alcohol Use: No    Review of Systems  Constitutional: Negative for fever.  Respiratory: Negative for shortness of breath.   Cardiovascular: Negative for chest pain.  Neurological: Negative for speech difficulty, weakness and headaches.  Psychiatric/Behavioral: Negative for hallucinations, confusion, dysphoric mood and decreased concentration.  No other specific complaints in a complete review of systems (except as listed in HPI above).      Objective:   Physical Exam  BP 110/72  Pulse 70  Temp(Src) 98.5 F (36.9 C) (Oral)  Ht 5' (1.524 m)  Wt 162 lb 9.6 oz (73.755 kg)  BMI 31.76 kg/m2  SpO2 97% Physical Exam  Constitutional: She is overweight; oriented to person, place, and time. She appears well-developed and well-nourished. No distress.  HENT: Head: Normocephalic and atraumatic. Ears; B TMs ok, no erythema or effusion; Nose: Nose normal.  Mouth/Throat: Oropharynx is clear and moist. No oropharyngeal exudate.  Eyes: Conjunctivae and EOM are normal. Pupils are equal, round, and reactive to light. No scleral icterus.  Neck: Normal range of motion. Neck supple. No JVD present. No thyromegaly present.  Cardiovascular: Normal rate, regular rhythm  and normal heart sounds.  No murmur heard. No BLE edema. Pulmonary/Chest: Effort normal and breath sounds normal. No respiratory distress. She has no wheezes.  Abdominal: Soft. Bowel sounds are normal. She exhibits no distension. There is no tenderness.  Musculoskeletal: Limited shoulder range of motion B, R>L, no active/hot joint effusions. No gross deformities Neurological: She is alert and oriented to person, place, and time. No cranial nerve deficit. Coordination normal.  Skin: Skin is warm and dry. No rash noted. No  erythema.  Psychiatric: She has a normal mood and affect. Her behavior is normal. Judgment and thought content normal.  Lab Results  Component Value Date   WBC 6.8 07/23/2010   HGB 10.9* 09/14/2010   HCT 32.0* 09/14/2010   PLT 223.0 07/23/2010   CHOL 168 06/04/2010   TRIG 69.0 06/04/2010   HDL 84.70 06/04/2010   LDLDIRECT 115.3 09/30/2009   ALT 21 09/30/2009   AST 24 06/04/2010   NA 138 09/14/2010   K 4.3 09/14/2010   CL 104 09/14/2010   CREATININE 1.10 09/14/2010   BUN 15 09/14/2010   CO2 31 07/23/2010   TSH 0.88 07/23/2010   INR 1.4 05/22/2008   HGBA1C 6.0 03/25/2010   MICROALBUR 1.1 09/30/2009    Lab Results  Component Value Date   VITAMINB12 869 07/23/2010    EKG: Sinus @ 69 - no ischemic changes  Assessment & Plan:  Preop eval - This patient has been evaluated and it is felt that the surgical risk is low and outweighed by the potential benefit of the surgery. Therefore, medically clear to proceed when scheduling allows. Time spent with pt today 30 minutes, greater than 50% time spent counseling patient on arthritis and surgery, diabetes, CAD hx and medication review. Also review of prior records and completion of preop forms for ortho

## 2010-11-05 NOTE — Patient Instructions (Signed)
It was good to see you today. You have been evaluated and it is felt that your surgical risk is low and outweighed by the potential benefit of the surgery. Therefore, you are medically clear to proceed  - good luck!

## 2010-11-06 ENCOUNTER — Telehealth: Payer: Self-pay | Admitting: Internal Medicine

## 2010-11-06 ENCOUNTER — Inpatient Hospital Stay (HOSPITAL_COMMUNITY): Payer: Medicare Other

## 2010-11-06 ENCOUNTER — Inpatient Hospital Stay (HOSPITAL_COMMUNITY)
Admission: RE | Admit: 2010-11-06 | Discharge: 2010-11-10 | DRG: 484 | Disposition: A | Discharge status: Skilled Nursing Facility | Payer: Medicare Other | Source: Ambulatory Visit | Attending: Orthopedic Surgery | Admitting: Orthopedic Surgery

## 2010-11-06 DIAGNOSIS — M12819 Other specific arthropathies, not elsewhere classified, unspecified shoulder: Principal | ICD-10-CM | POA: Diagnosis present

## 2010-11-06 DIAGNOSIS — Z96659 Presence of unspecified artificial knee joint: Secondary | ICD-10-CM

## 2010-11-06 DIAGNOSIS — K219 Gastro-esophageal reflux disease without esophagitis: Secondary | ICD-10-CM | POA: Diagnosis present

## 2010-11-06 DIAGNOSIS — Z87891 Personal history of nicotine dependence: Secondary | ICD-10-CM

## 2010-11-06 DIAGNOSIS — Z951 Presence of aortocoronary bypass graft: Secondary | ICD-10-CM

## 2010-11-06 DIAGNOSIS — E119 Type 2 diabetes mellitus without complications: Secondary | ICD-10-CM | POA: Diagnosis present

## 2010-11-06 DIAGNOSIS — I2510 Atherosclerotic heart disease of native coronary artery without angina pectoris: Secondary | ICD-10-CM | POA: Diagnosis present

## 2010-11-06 DIAGNOSIS — M719 Bursopathy, unspecified: Secondary | ICD-10-CM | POA: Diagnosis present

## 2010-11-06 DIAGNOSIS — M67919 Unspecified disorder of synovium and tendon, unspecified shoulder: Secondary | ICD-10-CM | POA: Diagnosis present

## 2010-11-06 LAB — SURGICAL PCR SCREEN
MRSA, PCR: NEGATIVE
Staphylococcus aureus: NEGATIVE

## 2010-11-06 LAB — GLUCOSE, CAPILLARY
Glucose-Capillary: 118 mg/dL — ABNORMAL HIGH (ref 70–99)
Glucose-Capillary: 118 mg/dL — ABNORMAL HIGH (ref 70–99)
Glucose-Capillary: 146 mg/dL — ABNORMAL HIGH (ref 70–99)

## 2010-11-06 LAB — CBC
HCT: 30.4 % — ABNORMAL LOW (ref 36.0–46.0)
Hemoglobin: 9.7 g/dL — ABNORMAL LOW (ref 12.0–15.0)
MCH: 26.1 pg (ref 26.0–34.0)
MCHC: 31.9 g/dL (ref 30.0–36.0)
MCV: 81.9 fL (ref 78.0–100.0)
Platelets: 216 10*3/uL (ref 150–400)
RBC: 3.71 MIL/uL — ABNORMAL LOW (ref 3.87–5.11)
RDW: 15.4 % (ref 11.5–15.5)
WBC: 5.8 10*3/uL (ref 4.0–10.5)

## 2010-11-06 LAB — PROTIME-INR
INR: 0.99 (ref 0.00–1.49)
Prothrombin Time: 13.3 seconds (ref 11.6–15.2)

## 2010-11-06 LAB — TYPE AND SCREEN
ABO/RH(D): O POS
Antibody Screen: NEGATIVE

## 2010-11-06 LAB — BASIC METABOLIC PANEL
BUN: 14 mg/dL (ref 6–23)
CO2: 25 mEq/L (ref 19–32)
Calcium: 9.5 mg/dL (ref 8.4–10.5)
Chloride: 103 mEq/L (ref 96–112)
Creatinine, Ser: 0.66 mg/dL (ref 0.50–1.10)
GFR calc Af Amer: >60 mL/min (ref >60)
GFR calc non Af Amer: >60 mL/min (ref >60)
Glucose, Bld: 116 mg/dL — ABNORMAL HIGH (ref 70–99)
Potassium: 4.2 mEq/L (ref 3.5–5.1)
Sodium: 139 mEq/L (ref 135–145)

## 2010-11-06 NOTE — Telephone Encounter (Signed)
Faxed LOV to [facility] at [facility] Short Stay ([phone]).

## 2010-11-07 LAB — BASIC METABOLIC PANEL
BUN: 10 mg/dL (ref 6–23)
CO2: 27 mEq/L (ref 19–32)
Calcium: 8.5 mg/dL (ref 8.4–10.5)
Chloride: 102 mEq/L (ref 96–112)
Creatinine, Ser: 0.65 mg/dL (ref 0.50–1.10)
GFR calc Af Amer: >60 mL/min (ref >60)
GFR calc non Af Amer: >60 mL/min (ref >60)
Glucose, Bld: 152 mg/dL — ABNORMAL HIGH (ref 70–99)
Potassium: 4 mEq/L (ref 3.5–5.1)
Sodium: 137 mEq/L (ref 135–145)

## 2010-11-07 LAB — GLUCOSE, CAPILLARY
Glucose-Capillary: 161 mg/dL — ABNORMAL HIGH (ref 70–99)
Glucose-Capillary: 131 mg/dL — ABNORMAL HIGH (ref 70–99)
Glucose-Capillary: 123 mg/dL — ABNORMAL HIGH (ref 70–99)
Glucose-Capillary: 155 mg/dL — ABNORMAL HIGH (ref 70–99)

## 2010-11-07 LAB — CBC
HCT: 26.7 % — ABNORMAL LOW (ref 36.0–46.0)
Hemoglobin: 8.5 g/dL — ABNORMAL LOW (ref 12.0–15.0)
MCH: 25.8 pg — ABNORMAL LOW (ref 26.0–34.0)
MCHC: 31.8 g/dL (ref 30.0–36.0)
MCV: 81.2 fL (ref 78.0–100.0)
Platelets: 188 10*3/uL (ref 150–400)
RBC: 3.29 MIL/uL — ABNORMAL LOW (ref 3.87–5.11)
RDW: 15.2 % (ref 11.5–15.5)
WBC: 7.8 10*3/uL (ref 4.0–10.5)

## 2010-11-08 LAB — CBC
HCT: 24 % — ABNORMAL LOW (ref 36.0–46.0)
Hemoglobin: 7.7 g/dL — ABNORMAL LOW (ref 12.0–15.0)
MCH: 26.2 pg (ref 26.0–34.0)
MCHC: 32.1 g/dL (ref 30.0–36.0)
MCV: 81.6 fL (ref 78.0–100.0)
Platelets: 160 10*3/uL (ref 150–400)
RBC: 2.94 MIL/uL — ABNORMAL LOW (ref 3.87–5.11)
RDW: 15.3 % (ref 11.5–15.5)
WBC: 7.6 10*3/uL (ref 4.0–10.5)

## 2010-11-08 LAB — GLUCOSE, CAPILLARY
Glucose-Capillary: 114 mg/dL — ABNORMAL HIGH (ref 70–99)
Glucose-Capillary: 165 mg/dL — ABNORMAL HIGH (ref 70–99)
Glucose-Capillary: 110 mg/dL — ABNORMAL HIGH (ref 70–99)
Glucose-Capillary: 137 mg/dL — ABNORMAL HIGH (ref 70–99)

## 2010-11-08 LAB — BASIC METABOLIC PANEL
BUN: 11 mg/dL (ref 6–23)
CO2: 26 mEq/L (ref 19–32)
Calcium: 8.2 mg/dL — ABNORMAL LOW (ref 8.4–10.5)
Chloride: 104 mEq/L (ref 96–112)
Creatinine, Ser: 0.72 mg/dL (ref 0.50–1.10)
GFR calc Af Amer: >60 mL/min (ref >60)
GFR calc non Af Amer: >60 mL/min (ref >60)
Glucose, Bld: 122 mg/dL — ABNORMAL HIGH (ref 70–99)
Potassium: 3.5 mEq/L (ref 3.5–5.1)
Sodium: 139 mEq/L (ref 135–145)

## 2010-11-09 LAB — GLUCOSE, CAPILLARY
Glucose-Capillary: 120 mg/dL — ABNORMAL HIGH (ref 70–99)
Glucose-Capillary: 126 mg/dL — ABNORMAL HIGH (ref 70–99)
Glucose-Capillary: 124 mg/dL — ABNORMAL HIGH (ref 70–99)
Glucose-Capillary: 89 mg/dL (ref 70–99)

## 2010-11-10 LAB — GLUCOSE, CAPILLARY
Glucose-Capillary: 115 mg/dL — ABNORMAL HIGH (ref 70–99)
Glucose-Capillary: 134 mg/dL — ABNORMAL HIGH (ref 70–99)

## 2010-11-11 NOTE — Op Note (Signed)
NAMECHERYLEE, [name] NO.:  [id number]  MEDICAL RECORD NO.:  [id number]  LOCATION:  5041                         FACILITY:  [facility]  PHYSICIAN:  Jones Broom, MD    DATE OF BIRTH:  Aug 09, 1930  DATE OF PROCEDURE:  11/06/2010 DATE OF DISCHARGE:                              OPERATIVE REPORT   PREOPERATIVE DIAGNOSIS:  Right shoulder rotator cuff tear arthropathy.  POSTOPERATIVE DIAGNOSIS:  Right shoulder rotator cuff tear arthropathy.  PROCEDURE PERFORMED:  Right reverse total shoulder arthroplasty.  ATTENDING SURGEON:  Jones Broom, MD  ASSISTANT:  Skip Mayer, PA-C  COMPLICATIONS:  None.  DRAINS:  One medium Hemovac.  SPECIMENS:  None.  ESTIMATED BLOOD LOSS:  250 mL.  INDICATIONS FOR SURGERY:  The patient is a 77 year old female with a long history of bilateral shoulder pain.  She has a known rotator cuff tear arthropathy on both sides.  Her right has become more symptomatic than the left and she has failed conservative management in the form of injections, therapy, medications, and activity modification.  She felt she could no longer live with her shoulder in her current condition and wished to go forward with surgery for pain control and to try to increase her function.  She understood risks, benefits, and alternatives to surgery including but not limited to risk of bleeding, infection, damage to neurovascular structures, risk of fracture, stiffness, dislocation, and potential need for future surgery.  She understood all of these and elected to go ahead with surgery.  OPERATIVE FINDINGS:  Examination under anesthesia demonstrated full range of motion with excessive external rotation to about 100 degrees. No laxity or instability was noted.  A DePuy cemented reverse total shoulder arthroplasty was placed with a size 10 stem, a 38 glenosphere, and a 6-mm poly insert.  The base plate fixation was good with superior, inferior, and posterior  screws.  No anterior screw was possible given the bony anatomy.  PROCEDURE:  The patient was identified in the preoperative holding area where I personally marked the operative site after verifying site, side, and procedure with the patient.  She had an interscalene block given by the attending anesthesiologist and was taken back to the operating room where she was transferred to the operative table.  All extremities were carefully padded in position after general anesthesia was induced.  She was placed in a beach-chair position and the right upper extremity was prepped and draped in a standard sterile fashion.  An approximately 10- cm incision was made from the tip of the coracoid process to the center point of the humerus at the level of the axilla.  Dissection was carried down through subcutaneous tissues to the level of the cephalic vein which was taken laterally with the deltoid.  The pectoralis major was retracted medially.  The subdeltoid space was developed and the lateral edge of the conjoined tendon was identified.  The undersurface of conjoined tendon was palpated and the musculocutaneous nerve was not in the field.  Retractor was placed underneath the conjoined and second retractor was placed lateral into the deltoid.  The circumflex humeral artery and vessels were identified and clamped and coagulated.  The biceps tendon was then identified  and traced into the joint through the groove.  The bicipital groove was noted, but she was noted to have no subscapularis at all on the lesser tuberosity.  The biceps was cut and tenodesed to soft tissues at the level of the pectoralis major.  The joint was then gently externally rotated while the capsule was released from the humeral neck around to just beyond the 6 o'clock position.  At this point, the joint was dislocated and the humeral head was presented into the wound.  The excessive osteophyte formation was removed with a large  rongeur and the intramedullary canal was then entered with sequential reamers up to a 10-mm reamer which was felt to be the appropriate size.  The cutting guide was used to make the appropriate head cut and the head was saved potentially bone grafting.  The cut protector was then placed and the glenoid was exposed with the arm in an abducted extended position.  The anterior and posterior labrum were completely excised and the capsule was released circumferentially to allow for exposure of the glenoid for preparation.  The guidepin was placed using the guide in slight inferior angulation and the reamer was placed over the guidepin to ream down to concentric service.  Superior hand reamer was used as well.  The central drill hole was then made and stayed within the glenoid vault.  The Metaglene was then impacted with excellent press fit.  The superior and inferior screws were then drilled, measured, and filled with appropriate-sized locking screw alternatively tightening top and bottom to bring the Metaglene down tightly against the bone.  Posterior screw was then placed with a nonlocking 18-mm screw with good fixation and the anterior screw hole was not filled given the lack of good bone in this position.  The 38-mm glenosphere was then impacted over the [facility] taper and tightened down with the screw.  The humerus was then again exposed and the humerus was prepared for implantation of the size 10 component.  Cement restrictor was placed and the canal was cleaned and dried.  The size 10 stem was cemented into place.  The size 6 trial was felt to be the most appropriate soft tissue tensioning with excellent stability and excellent range of motion.  Therefore, the size 6 polyethylene component was implanted and taken through full range of motion with excellent stability and motion.  The joint was then copiously irrigated with pulse lavage and the wound was then closed.  There was no soft tissue  closure of the subscapularis to capsule as there was no significant tissue to close.  Skin was closed with 2-0 Vicryl in a deep dermal layer and 4-0 Monocryl for skin closure.  Steri-Strips were applied.  I did use a medium Hemovac drain underneath the deltoid prior to closure.  Sterile dressings were then applied as well as a sling.  The patient was allowed to awaken from general anesthesia, transferred to stretcher, and taken to recovery room in stable condition.  POSTOPERATIVE PLAN:  She will be kept in the hospital postoperatively for pain control and therapy.  She will be transitioned to a skilled nursing facility as she does not have adequate help at home.     Jones Broom, MD     JC/MEDQ  D:  11/06/2010  T:  11/07/2010  Job:  [id number]  Electronically Signed by Jones Broom  on 11/11/2010 03:58:05 PM

## 2010-11-11 NOTE — Discharge Summary (Signed)
  [name], [name] NO.:  [id number]  MEDICAL RECORD NO.:  [id number]  LOCATION:  5041                         FACILITY:  [facility]  PHYSICIAN:  Jones Broom, MD    DATE OF BIRTH:  1930-08-24  DATE OF ADMISSION:  11/06/2010 DATE OF DISCHARGE:  11/10/2010                              DISCHARGE SUMMARY   FINAL DIAGNOSES: 1. Right shoulder rotator cuff tear arthropathy. 2. Coronary artery disease. 3. Diabetes.  PRINCIPAL PROCEDURE:  November 06, 2010, right shoulder reverse total shoulder arthroplasty.  HOSPITAL COURSE:  The patient was admitted on November 06, 2010, after undergoing right total shoulder arthroplasty and recovered well on the floor postoperatively.  Drain was discontinued on postoperative day #1 and her pain was initially controlled with IV and subsequently oral pain medications.  She remained stable with stable vital signs throughout her hospital course and her pain was well controlled.  She remained neurovascularly intact.  She had physical and occupational therapy and was set up for transition to a skilled nursing facility given the fact that she has minimal social support at home.  On November 10, 2010, her incision was clean, dry and intact.  She was in stable condition with stable vital signs and was discharged to skilled nursing facility.  DISCHARGE INSTRUCTIONS:  She will have aspirin for DVT prophylaxis 325 mg p.o. twice daily and Vicodin for pain control.  She will resume all of her prehospital medications as well.  She will have dry dressing as needed only for her right shoulder, and otherwise can leave it open to air with the Steri-Strips intact.  She can shower postoperative day #5, but not soak the wound.  She will have gentle hand, wrist, and elbow motion only and otherwise will remain in her sling with the right upper extremity until followup.  She will work with the therapist on ambulation and gait training as well as ADLs and her  sling.  She will follow up with me 10-14 days postoperatively for wound check and can call or return sooner with any problems or concerns at [phone].     Jones Broom, MD     JC/MEDQ  D:  11/10/2010  T:  11/10/2010  Job:  [id number]  Electronically Signed by Jones Broom  on 11/11/2010 03:58:08 PM

## 2010-12-03 ENCOUNTER — Ambulatory Visit: Payer: Medicare Other | Admitting: Internal Medicine

## 2010-12-03 DIAGNOSIS — Z0289 Encounter for other administrative examinations: Secondary | ICD-10-CM

## 2010-12-08 ENCOUNTER — Encounter: Payer: Self-pay | Admitting: Internal Medicine

## 2010-12-08 ENCOUNTER — Ambulatory Visit (INDEPENDENT_AMBULATORY_CARE_PROVIDER_SITE_OTHER): Payer: Medicare Other | Admitting: Internal Medicine

## 2010-12-08 VITALS — BP 132/70 | HR 68 | Temp 98.6°F | Ht 62.0 in | Wt 160.8 lb

## 2010-12-08 DIAGNOSIS — D62 Acute posthemorrhagic anemia: Secondary | ICD-10-CM

## 2010-12-08 DIAGNOSIS — E119 Type 2 diabetes mellitus without complications: Secondary | ICD-10-CM

## 2010-12-08 DIAGNOSIS — E785 Hyperlipidemia, unspecified: Secondary | ICD-10-CM

## 2010-12-08 DIAGNOSIS — I10 Essential (primary) hypertension: Secondary | ICD-10-CM

## 2010-12-08 MED ORDER — RAMIPRIL 5 MG PO CAPS: 5.0000 mg | ORAL_CAPSULE | Freq: Every day | ORAL | Status: DC

## 2010-12-08 MED ORDER — AMLODIPINE BESYLATE 5 MG PO TABS: 5.0000 mg | ORAL_TABLET | Freq: Every day | ORAL | Status: DC

## 2010-12-08 MED ORDER — CETIRIZINE HCL 10 MG PO TABS: 10.0000 mg | ORAL_TABLET | Freq: Every day | ORAL | Status: DC

## 2010-12-08 MED ORDER — CARVEDILOL 6.25 MG PO TABS: 6.2500 mg | ORAL_TABLET | Freq: Two times a day (BID) | ORAL | Status: DC

## 2010-12-08 MED ORDER — DEXLANSOPRAZOLE 60 MG PO CPDR: 60.0000 mg | DELAYED_RELEASE_CAPSULE | Freq: Every day | ORAL | Status: DC

## 2010-12-08 MED ORDER — POLYSACCHARIDE IRON COMPLEX 150 MG PO CAPS: 150.0000 mg | ORAL_CAPSULE | Freq: Two times a day (BID) | ORAL | Status: DC

## 2010-12-08 NOTE — Assessment & Plan Note (Signed)
The current medical regimen (metformin) is effective;  continue present plan and medications. Check a1c with next labs Lab Results  Component Value Date   HGBA1C 6.0 03/25/2010

## 2010-12-08 NOTE — Progress Notes (Signed)
Subjective:    Patient ID: [name], female    DOB: 11/09/1930, 77 y.o.   MRN: [id number]  HPI  here for anemia - status post  R shoulder replacement 10/1910 On iron since surgery due to anemia -  no CP or SOB or edema - no DOE  No NSAIDs, melena or BRBPR  Also reviewed chronic medical issues: HTN - reports compliance with ongoing medical treatment and no changes in medication dose or frequency. denies adverse side effects related to current therapy.   dyslipidemia - on lipitor - reports compliance with ongoing medical treatment and no changes in medication dose or frequency. denies adverse side effects related to current therapy.   DM2 - reports compliance with ongoing medical treatment and no changes in medication dose or frequency. denies adverse side effects related to current therapy. no hypoglycemia or hyperglycemia  diffuse, severe arthritis both inflammatory and DJD, dx never clarified in past but suspect RA - follows with rheum and ortho - participating in water areobics and PT - tolerable but constant pain with mild swelling in hands, wrists, knees and neck - also pain in both shoulders, but R improved s/p Total joint 10/2010 replacment as rec by ortho.  uses hydrocodone sparingly  Past Medical History  Diagnosis Date  . S/P CABG x 1   . S/P inguinal hernia repair   . 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 CAD, NATIVE VESSEL 06/09/2008  . UNSPECIFIED ANEMIA   . DIABETES MELLITUS, TYPE II   . GERD   . HYPERLIPIDEMIA   . HYPERTENSION   . HIATAL HERNIA   . FIBROMYALGIA   . OA (osteoarthritis)     severe, shoulders, hands - inflammatory, ?RA    Review of Systems  Respiratory: Negative for shortness of breath.   Cardiovascular: Negative for chest pain.  Gastrointestinal: Negative for abdominal pain and blood in stool.  Neurological: Negative for weakness.  Psychiatric/Behavioral: Negative for dysphoric mood.      Objective:   Physical Exam   BP 132/70   Pulse 68  Temp(Src) 98.6 F (37 C) (Oral)  Ht 5\' 2"  (1.575 m)  Wt 160 lb 12.8 oz (72.938 kg)  BMI 29.41 kg/m2  SpO2 96% Constitutional: She is overweight; oriented to person, place, and time. She appears well-developed and well-nourished. No distress.  Cardiovascular: Normal rate, regular rhythm and normal heart sounds.  No murmur heard. No BLE edema. Pulmonary/Chest: Effort normal and breath sounds normal. No respiratory distress. She has no wheezes. Abd: SNTND, +BS Musculoskeletal: Limited shoulder range of motion L, much improved on R; No gross deformities Neurological: She is alert and oriented to person, place, and time. No cranial nerve deficit. Coordination normal.  Skin: Skin is warm and dry. No rash noted. No erythema. well approx insicion on R anterior shoulder Psychiatric: She has a normal mood and affect. Her behavior is normal. Judgment and thought content normal.  Lab Results  Component Value Date   WBC 7.6 11/08/2010   HGB 7.7* 11/08/2010   HCT 24.0* 11/08/2010   PLT 160 11/08/2010   CHOL 168 06/04/2010   TRIG 69.0 06/04/2010   HDL 84.70 06/04/2010   LDLDIRECT 115.3 09/30/2009   ALT 21 09/30/2009   AST 24 06/04/2010   NA 139 11/08/2010   K 3.5 11/08/2010   CL 104 11/08/2010   CREATININE 0.72 11/08/2010   BUN 11 11/08/2010   CO2 26 11/08/2010   TSH 0.88 07/23/2010   INR 0.99 11/06/2010   HGBA1C 6.0 03/25/2010  MICROALBUR 1.1 09/30/2009    Lab Results  Component Value Date   VITAMINB12 869 07/23/2010   Lab Results  Component Value Date   IRON 95 10/15/2008    Assessment & Plan:   Anemia, periop loss - 285.1 - labs reviewed, Hgb up to 9.1 11/27/10 on recheck with ortho - no hx for ongoing loss -  continue iron bid (erx done) and recheck here in 2 weeks (lab entered)  Also see problem list. Medications and labs reviewed today.

## 2010-12-08 NOTE — Assessment & Plan Note (Signed)
Last lipids reviewed - at goal -  continue Lipitor as ongoing (hx CAD/CABG)

## 2010-12-08 NOTE — Assessment & Plan Note (Signed)
Fair control - continue ongoing medical therapy as ongoing  BP Readings from Last 3 Encounters:  12/08/10 132/70  11/05/10 110/72  09/03/10 118/62

## 2010-12-08 NOTE — Patient Instructions (Addendum)
It was good to see you today. I am glad your shoulder surgery went so well! Continue iron pills 2x/day - Your prescription(s) have been submitted to your pharmacy. Please take as directed and contact our office if you believe you are having problem(s) with the medication(s). Return for lab only check in 2 week to monitor the anemia - Your results will be called to you after review (48-72hours after test completion). If any changes need to be made, you will be notified at that time. Please schedule followup in 3-4 months, call sooner if problems.

## 2010-12-09 MED ORDER — CETIRIZINE HCL 10 MG PO TABS: 10.0000 mg | ORAL_TABLET | Freq: Every day | ORAL | Status: DC

## 2010-12-09 NOTE — Progress Notes (Signed)
Addended by: Deatra James on: 12/09/2010 11:49 AM   Modules accepted: Orders

## 2010-12-17 ENCOUNTER — Other Ambulatory Visit: Payer: Self-pay | Admitting: *Deleted

## 2010-12-18 ENCOUNTER — Encounter: Payer: Self-pay | Admitting: Internal Medicine

## 2010-12-18 ENCOUNTER — Ambulatory Visit (INDEPENDENT_AMBULATORY_CARE_PROVIDER_SITE_OTHER): Payer: Medicare Other | Admitting: Internal Medicine

## 2010-12-18 VITALS — BP 132/70 | HR 71 | Temp 98.1°F | Ht 60.0 in

## 2010-12-18 DIAGNOSIS — L299 Pruritus, unspecified: Secondary | ICD-10-CM

## 2010-12-18 MED ORDER — TRIAMCINOLONE ACETONIDE 0.1 % EX CREA: TOPICAL_CREAM | Freq: Two times a day (BID) | CUTANEOUS | Status: DC

## 2010-12-18 MED ORDER — ZOLPIDEM TARTRATE 5 MG PO TABS: 5.0000 mg | ORAL_TABLET | Freq: Every evening | ORAL | Status: DC | PRN

## 2010-12-18 MED ORDER — CARVEDILOL 6.25 MG PO TABS: 6.2500 mg | ORAL_TABLET | Freq: Two times a day (BID) | ORAL | Status: DC

## 2010-12-18 MED ORDER — RAMIPRIL 5 MG PO CAPS: 5.0000 mg | ORAL_CAPSULE | Freq: Every day | ORAL | Status: DC

## 2010-12-18 MED ORDER — CETIRIZINE HCL 10 MG PO TABS: 10.0000 mg | ORAL_TABLET | Freq: Every day | ORAL | Status: DC

## 2010-12-18 MED ORDER — AMLODIPINE BESYLATE 5 MG PO TABS: 5.0000 mg | ORAL_TABLET | Freq: Every day | ORAL | Status: DC

## 2010-12-18 NOTE — Progress Notes (Signed)
  Subjective:    Patient ID: [name], female    DOB: 07-Sep-1930, 77 y.o.   MRN: [id number]  HPI  complains of itch - no rash Onset 2 days ago, but improved in last 12h No rash, no insect bite - ?med effect (now corrected)  Review of Systems  Constitutional: Negative for fever.  Skin: Negative for color change and wound.       Objective:   Physical Exam BP 132/70  Pulse 71  Temp(Src) 98.1 F (36.7 C) (Oral)  Ht 5' (1.524 m)  SpO2 96% Gen: NAD, nontoxic Skin - no rash or abnormality over affected skin but +excoriation evidence CV: RRR Lung CTA B  Lab Results  Component Value Date   WBC 7.6 11/08/2010   HGB 7.7* 11/08/2010   HCT 24.0* 11/08/2010   PLT 160 11/08/2010   CHOL 168 06/04/2010   TRIG 69.0 06/04/2010   HDL 84.70 06/04/2010   LDLDIRECT 115.3 09/30/2009   ALT 21 09/30/2009   AST 24 06/04/2010   NA 139 11/08/2010   K 3.5 11/08/2010   CL 104 11/08/2010   CREATININE 0.72 11/08/2010   BUN 11 11/08/2010   CO2 26 11/08/2010   TSH 0.88 07/23/2010   INR 0.99 11/06/2010   HGBA1C 6.0 03/25/2010   MICROALBUR 1.1 09/30/2009       Assessment & Plan:  Itch - ?dry skin or med reaction - improved - empiric steroid topical cream to use as needed - erx done

## 2010-12-18 NOTE — Patient Instructions (Signed)
It was good to see you today. Use "itch cream" to affected areas as needed - Your prescription(s) have been submitted to your pharmacy. Please take as directed and contact our office if you believe you are having problem(s) with the medication(s). We have corrected your other medical concerns - call if other problems

## 2010-12-24 ENCOUNTER — Other Ambulatory Visit (INDEPENDENT_AMBULATORY_CARE_PROVIDER_SITE_OTHER): Payer: Medicare Other

## 2010-12-24 DIAGNOSIS — D62 Acute posthemorrhagic anemia: Secondary | ICD-10-CM

## 2010-12-24 DIAGNOSIS — E119 Type 2 diabetes mellitus without complications: Secondary | ICD-10-CM

## 2010-12-24 LAB — CBC WITH DIFFERENTIAL/PLATELET
Basophils Absolute: 0 10*3/uL (ref 0.0–0.1)
Basophils Relative: 0.5 % (ref 0.0–3.0)
Eosinophils Absolute: 0.1 10*3/uL (ref 0.0–0.7)
Eosinophils Relative: 2.4 % (ref 0.0–5.0)
HCT: 31.4 % — ABNORMAL LOW (ref 36.0–46.0)
Hemoglobin: 9.9 g/dL — ABNORMAL LOW (ref 12.0–15.0)
Lymphocytes Relative: 41.4 % (ref 12.0–46.0)
Lymphs Abs: 2.3 10*3/uL (ref 0.7–4.0)
MCHC: 31.5 g/dL (ref 30.0–36.0)
MCV: 80.5 fl (ref 78.0–100.0)
Monocytes Absolute: 0.6 10*3/uL (ref 0.1–1.0)
Monocytes Relative: 11.2 % (ref 3.0–12.0)
Neutro Abs: 2.5 10*3/uL (ref 1.4–7.7)
Neutrophils Relative %: 44.5 % (ref 43.0–77.0)
Platelets: 261 10*3/uL (ref 150.0–400.0)
RBC: 3.9 Mil/uL (ref 3.87–5.11)
RDW: 17.3 % — ABNORMAL HIGH (ref 11.5–14.6)
WBC: 5.6 10*3/uL (ref 4.5–10.5)

## 2010-12-24 LAB — HEMOGLOBIN A1C: Hgb A1c MFr Bld: 6 % (ref 4.6–6.5)

## 2011-01-08 ENCOUNTER — Telehealth: Payer: Self-pay | Admitting: *Deleted

## 2011-01-08 MED ORDER — DEXLANSOPRAZOLE 60 MG PO CPDR: 60.0000 mg | DELAYED_RELEASE_CAPSULE | Freq: Every day | ORAL | Status: DC

## 2011-01-08 MED ORDER — POLYSACCHARIDE IRON COMPLEX 150 MG PO CAPS: 150.0000 mg | ORAL_CAPSULE | Freq: Two times a day (BID) | ORAL | Status: DC

## 2011-01-08 NOTE — Telephone Encounter (Signed)
Pt left note need dexliant rx sent to mail service. Called pt to verify mail service. Sending med to [facility]...01/08/11@2 :34pm/LMB

## 2011-01-29 ENCOUNTER — Other Ambulatory Visit: Payer: Self-pay | Admitting: Internal Medicine

## 2011-01-29 DIAGNOSIS — Z1231 Encounter for screening mammogram for malignant neoplasm of breast: Secondary | ICD-10-CM

## 2011-02-10 ENCOUNTER — Encounter: Payer: Self-pay | Admitting: Internal Medicine

## 2011-02-10 ENCOUNTER — Ambulatory Visit: Payer: BLUE CROSS/BLUE SHIELD | Admitting: Internal Medicine

## 2011-02-10 ENCOUNTER — Ambulatory Visit (INDEPENDENT_AMBULATORY_CARE_PROVIDER_SITE_OTHER): Payer: Medicare Other | Admitting: Internal Medicine

## 2011-02-10 ENCOUNTER — Other Ambulatory Visit (INDEPENDENT_AMBULATORY_CARE_PROVIDER_SITE_OTHER): Payer: Medicare Other

## 2011-02-10 VITALS — BP 142/72 | HR 88 | Temp 98.5°F | Wt 152.4 lb

## 2011-02-10 DIAGNOSIS — I10 Essential (primary) hypertension: Secondary | ICD-10-CM

## 2011-02-10 DIAGNOSIS — D62 Acute posthemorrhagic anemia: Secondary | ICD-10-CM

## 2011-02-10 DIAGNOSIS — M25519 Pain in unspecified shoulder: Secondary | ICD-10-CM

## 2011-02-10 DIAGNOSIS — E785 Hyperlipidemia, unspecified: Secondary | ICD-10-CM

## 2011-02-10 LAB — CBC WITH DIFFERENTIAL/PLATELET
Basophils Absolute: 0.1 10*3/uL (ref 0.0–0.1)
Basophils Relative: 0.9 % (ref 0.0–3.0)
Eosinophils Absolute: 0 10*3/uL (ref 0.0–0.7)
Eosinophils Relative: 0.3 % (ref 0.0–5.0)
HCT: 31.3 % — ABNORMAL LOW (ref 36.0–46.0)
Hemoglobin: 10 g/dL — ABNORMAL LOW (ref 12.0–15.0)
Lymphocytes Relative: 33.4 % (ref 12.0–46.0)
Lymphs Abs: 2.3 10*3/uL (ref 0.7–4.0)
MCHC: 32.1 g/dL (ref 30.0–36.0)
MCV: 78.6 fl (ref 78.0–100.0)
Monocytes Absolute: 0.4 10*3/uL (ref 0.1–1.0)
Monocytes Relative: 6.4 % (ref 3.0–12.0)
Neutro Abs: 4 10*3/uL (ref 1.4–7.7)
Neutrophils Relative %: 59 % (ref 43.0–77.0)
Platelets: 263 10*3/uL (ref 150.0–400.0)
RBC: 3.98 Mil/uL (ref 3.87–5.11)
RDW: 18.5 % — ABNORMAL HIGH (ref 11.5–14.6)
WBC: 6.9 10*3/uL (ref 4.5–10.5)

## 2011-02-10 LAB — IBC PANEL
Iron: 23 ug/dL — ABNORMAL LOW (ref 42–145)
Saturation Ratios: 4.9 % — ABNORMAL LOW (ref 20.0–50.0)
Transferrin: 332.3 mg/dL (ref 212.0–360.0)

## 2011-02-10 MED ORDER — POLYSACCHARIDE IRON COMPLEX 150 MG PO CAPS: 150.0000 mg | ORAL_CAPSULE | Freq: Two times a day (BID) | ORAL | Status: DC

## 2011-02-10 NOTE — Assessment & Plan Note (Signed)
Last lipids reviewed - at goal -  continue Lipitor as ongoing (hx CAD/CABG) 

## 2011-02-10 NOTE — Progress Notes (Signed)
Subjective:    Patient ID: [name], female    DOB: 01-03-1931, 77 y.o.   MRN: [id number]  HPI  here for follow up -reviewed chronic medical issues:  anemia - ABL status post  R shoulder replacement 10/1910 no CP or SOB or edema - no DOE  No NSAIDs, melena or BRBPR complains of low energy and mild fatigue .abd and L flank pain last week x 3 days, but resolved with dc of miralax - no pain today  HTN - reports compliance with ongoing medical treatment and no changes in medication dose or frequency. denies adverse side effects related to current therapy.   dyslipidemia - on lipitor - reports compliance with ongoing medical treatment and no changes in medication dose or frequency. denies adverse side effects related to current therapy.   DM2 - reports compliance with ongoing medical treatment and no changes in medication dose or frequency. denies adverse side effects related to current therapy. no hypoglycemia or hyperglycemia  diffuse, severe arthritis both inflammatory and DJD, dx never clarified in past but suspect RA - follows with rheum and ortho - participating in water areobics and PT - tolerable but constant pain with mild swelling in hands, wrists, knees and neck - also pain in both shoulders, but R improved s/p total joint 10/2010 replacment as rec by ortho.  uses hydrocodone sparingly  Past Medical History  Diagnosis Date  . S/P CABG x 1   . S/P inguinal hernia repair   . 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 CAD, NATIVE VESSEL 06/09/2008  . UNSPECIFIED ANEMIA   . DIABETES MELLITUS, TYPE II   . GERD   . HYPERLIPIDEMIA   . HYPERTENSION   . HIATAL HERNIA   . FIBROMYALGIA   . OA (osteoarthritis)     severe, shoulders, hands - inflammatory, ?RA    Review of Systems  Constitutional: Positive for fatigue. Negative for fever and unexpected weight change.  Respiratory: Negative for cough and shortness of breath.   Cardiovascular: Negative for chest pain.    Gastrointestinal: Negative for constipation and blood in stool.  Genitourinary: Negative for dysuria.  Neurological: Negative for weakness.  Psychiatric/Behavioral: Negative for dysphoric mood.      Objective:   Physical Exam   BP 142/72  Pulse 88  Temp(Src) 98.5 F (36.9 C) (Oral)  Wt 152 lb 6.4 oz (69.128 kg)  SpO2 97% Constitutional: She is overweight; appears well-developed and well-nourished. No distress.  Cardiovascular: Normal rate, regular rhythm and normal heart sounds.  No murmur heard. No BLE edema. Pulmonary/Chest: Effort normal and breath sounds normal. No respiratory distress. She has no wheezes. Abd: SNTND, +BS Musculoskeletal: Limited shoulder range of motion L, much improved on R; No gross deformities Neurological: She is alert and oriented to person, place, and time. No cranial nerve deficit. Coordination normal.  Skin: Skin is warm and dry. No rash noted. No erythema.  Psychiatric: She has a normal mood and affect. Her behavior is normal. Judgment and thought content normal.  Lab Results  Component Value Date   WBC 5.6 12/24/2010   HGB 9.9* 12/24/2010   HCT 31.4* 12/24/2010   PLT 261.0 12/24/2010   CHOL 168 06/04/2010   TRIG 69.0 06/04/2010   HDL 84.70 06/04/2010   LDLDIRECT 115.3 09/30/2009   ALT 21 09/30/2009   AST 24 06/04/2010   NA 139 11/08/2010   K 3.5 11/08/2010   CL 104 11/08/2010   CREATININE 0.72 11/08/2010   BUN 11 11/08/2010   CO2  26 11/08/2010   TSH 0.88 07/23/2010   INR 0.99 11/06/2010   HGBA1C 6.0 12/24/2010   MICROALBUR 1.1 09/30/2009    Lab Results  Component Value Date   VITAMINB12 869 07/23/2010   Lab Results  Component Value Date   IRON 95 10/15/2008    Assessment & Plan:   Anemia, periop loss - 285.1 - labs reviewed, Hgb up to 9.1 11/27/10 on recheck with ortho, then 9.9 12/2010 no hx for ongoing loss -  continue iron bid (erx done)  Also see problem list. Medications and labs reviewed today.

## 2011-02-10 NOTE — Patient Instructions (Signed)
It was good to see you today. Continue iron pills 2x/day - Your prescription(s) have been submitted to your pharmacy. Please take as directed and contact our office if you believe you are having problem(s) with the medication(s). Labs ordered today - Your results will be called to you after review (48-72hours after test completion). If any changes need to be made, you will be notified at that time. Please keep scheduled followup ifor diabetes mellitus and blood pressure check, call sooner if problems.

## 2011-02-10 NOTE — Assessment & Plan Note (Signed)
Fair control - continue ongoing medical therapy as ongoing  BP Readings from Last 3 Encounters:  02/10/11 142/72  12/18/10 132/70  12/08/10 132/70

## 2011-02-10 NOTE — Assessment & Plan Note (Signed)
End stage DJD of shoulders and inflamm arthropathy of hands/wrists - ?RA S/p R total shoulder 10/2010 improved with NSAIDs and PT -  F/u rheum and ortho as planned/as needed

## 2011-02-11 ENCOUNTER — Telehealth: Payer: Self-pay | Admitting: *Deleted

## 2011-02-11 MED ORDER — POLYSACCHARIDE IRON COMPLEX 150 MG PO CAPS: 150.0000 mg | ORAL_CAPSULE | Freq: Two times a day (BID) | ORAL | Status: DC

## 2011-02-11 NOTE — Telephone Encounter (Signed)
Notified pt with labs needing nu-iron sent to [facility]...02/11/11@1 :33pm/LMB

## 2011-02-19 ENCOUNTER — Ambulatory Visit
Admission: RE | Admit: 2011-02-19 | Discharge: 2011-02-19 | Disposition: A | Discharge status: Home / Self Care | Payer: Medicare Other | Source: Ambulatory Visit | Attending: Internal Medicine | Admitting: Internal Medicine

## 2011-02-19 ENCOUNTER — Ambulatory Visit: Payer: BLUE CROSS/BLUE SHIELD

## 2011-02-19 DIAGNOSIS — Z1231 Encounter for screening mammogram for malignant neoplasm of breast: Secondary | ICD-10-CM

## 2011-02-24 ENCOUNTER — Other Ambulatory Visit: Payer: Self-pay | Admitting: *Deleted

## 2011-02-24 MED ORDER — METFORMIN HCL ER (MOD) 500 MG PO TB24: 500.0000 mg | ORAL_TABLET | Freq: Two times a day (BID) | ORAL | Status: DC

## 2011-02-24 MED ORDER — CETIRIZINE HCL 10 MG PO TABS: 10.0000 mg | ORAL_TABLET | Freq: Every day | ORAL | Status: DC

## 2011-03-03 ENCOUNTER — Telehealth: Payer: Self-pay | Admitting: *Deleted

## 2011-03-03 MED ORDER — CETIRIZINE HCL 10 MG PO TABS: 10.0000 mg | ORAL_TABLET | Freq: Every day | ORAL | Status: DC

## 2011-03-03 MED ORDER — METFORMIN HCL ER (MOD) 500 MG PO TB24: 500.0000 mg | ORAL_TABLET | Freq: Two times a day (BID) | ORAL | Status: DC

## 2011-03-03 NOTE — Telephone Encounter (Signed)
Pt is requesting 90 day on her metformin & zyrtec. Can call back @ [phone], or fax to [phone]. Ref # [id number]

## 2011-03-03 NOTE — Telephone Encounter (Signed)
Faxed script back to mail order services...03/03/11@11 :02am/LMB

## 2011-03-16 ENCOUNTER — Other Ambulatory Visit: Payer: Self-pay | Admitting: *Deleted

## 2011-03-16 MED ORDER — POLYSACCHARIDE IRON COMPLEX 150 MG PO CAPS: 150.0000 mg | ORAL_CAPSULE | Freq: Two times a day (BID) | ORAL | Status: DC

## 2011-03-30 ENCOUNTER — Telehealth: Payer: Self-pay | Admitting: Internal Medicine

## 2011-03-30 NOTE — Telephone Encounter (Signed)
Called patient. She states that she has arthritis in her hands,shoulders,and knees and does not want to take pain medication any more. She would like to go back on [facility] per Dr.Graves but wants to know if Dr.Ross is OK with this. Has appointment with Dr.Ross in 10month. Advised I will ask Dr.Ross and call her back at the end of the week. Patient verbalized understanding.

## 2011-03-30 NOTE — Telephone Encounter (Signed)
Patient has an appointment for 04/29/10 and wants to be seen sooner. Her rheumatologist took her off of the antiinflammatory med and wants to go back on it. Patient wants to make sure that it is ok to go back considering her cardiac history.

## 2011-04-03 NOTE — Telephone Encounter (Signed)
LMOM with MD recommendations.

## 2011-04-03 NOTE — Telephone Encounter (Signed)
OK to try Mobic.  With degree of pain, debility need to try so she can retain mobility. Use lowest dose possible.  Better than other NSAIDS. Call back in a few weeks with response

## 2011-04-28 DIAGNOSIS — M25569 Pain in unspecified knee: Secondary | ICD-10-CM | POA: Diagnosis not present

## 2011-04-30 ENCOUNTER — Ambulatory Visit (INDEPENDENT_AMBULATORY_CARE_PROVIDER_SITE_OTHER): Payer: Medicare Other | Admitting: Internal Medicine

## 2011-04-30 ENCOUNTER — Encounter: Payer: Self-pay | Admitting: Internal Medicine

## 2011-04-30 VITALS — BP 138/64 | HR 70 | Resp 18 | Ht 60.0 in | Wt 157.8 lb

## 2011-04-30 DIAGNOSIS — I2510 Atherosclerotic heart disease of native coronary artery without angina pectoris: Secondary | ICD-10-CM

## 2011-04-30 DIAGNOSIS — I10 Essential (primary) hypertension: Secondary | ICD-10-CM | POA: Diagnosis not present

## 2011-04-30 DIAGNOSIS — E785 Hyperlipidemia, unspecified: Secondary | ICD-10-CM | POA: Diagnosis not present

## 2011-04-30 NOTE — Assessment & Plan Note (Signed)
No symptoms to suggest angina.  Patient is at low risk to proceed with any planned orthopedic surgeries.

## 2011-04-30 NOTE — Progress Notes (Signed)
HPI Patient is a 78 year old with a history of CAD (s/p CABG in 2010), HTN, dyslipidemia and fibromyalgia.  I saw her in May.  Lipid panel last feb LDL was 70, HDL was 85. Since seen she has done well from a cardiac standpoint.  No chest pain.  Has L shoulder pain due to DJD.  Planning to have surgery next summer.    No Known Allergies  Current Outpatient Prescriptions  Medication Sig Dispense Refill  . amLODipine (NORVASC) 5 MG tablet Take 1 tablet (5 mg total) by mouth daily.  90 tablet  1  . Ascorbic Acid (VITAMIN C) 500 MG tablet Take 500 mg by mouth daily.        Marland Kitchen atorvastatin (LIPITOR) 40 MG tablet Take 40 mg by mouth at bedtime.        . calcium carbonate (OS-CAL) 600 MG TABS Take 600 mg by mouth daily. With Vit D       . carvedilol (COREG) 6.25 MG tablet Take 1 tablet (6.25 mg total) by mouth 2 (two) times daily with a meal.  180 tablet  1  . cetirizine (ZYRTEC) 10 MG tablet Take 1 tablet (10 mg total) by mouth daily.  90 tablet  1  . dexlansoprazole (DEXILANT) 60 MG capsule Take 1 capsule (60 mg total) by mouth daily.  90 capsule  1  . iron polysaccharides (NU-IRON) 150 MG capsule Take 1 capsule (150 mg total) by mouth 2 (two) times daily.  60 capsule  5  . metFORMIN (GLUMETZA) 500 MG (MOD) 24 hr tablet Take 1 tablet (500 mg total) by mouth 2 (two) times daily.  180 tablet  1  . Multiple Vitamin (MULTIVITAMIN) tablet Take 1 tablet by mouth daily.        . ramipril (ALTACE) 5 MG capsule Take 1 capsule (5 mg total) by mouth daily.  90 capsule  1  . zolpidem (AMBIEN) 5 MG tablet Take 1 tablet (5 mg total) by mouth at bedtime as needed.  90 tablet  0  . omega-3 acid ethyl esters (LOVAZA) 1 G capsule Take by mouth 2 (two) times daily.         Past Medical History  Diagnosis Date  . S/P CABG x 1   . S/P inguinal hernia repair   . 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 CAD, NATIVE VESSEL 06/09/2008  . UNSPECIFIED ANEMIA   . DIABETES MELLITUS, TYPE II   . GERD   . HYPERLIPIDEMIA    . HYPERTENSION   . HIATAL HERNIA   . FIBROMYALGIA   . OA (osteoarthritis)     severe, shoulders, hands - inflammatory, ?RA    Past Surgical History  Procedure Date  . Hernia repair   . Abdominal hysterectomy 1981  . Coronary artery bypass graft 2010  . Appendectomy   . Tubal ligation   . Replacement total knee bilateral   . Rotator cuff repair     Left  . Ovarian cyst removal   . Cataract extraction, bilateral     Family History  Problem Relation Age of Onset  . Cancer Mother     Stomach  . Heart disease Father 34    MI  . Diabetes Sister   . Heart disease Sister   . Heart attack Brother     History   Social History  . Marital Status: Widowed    Spouse Name: N/A    Number of Children: N/A  . Years of Education: N/A   Occupational  History  . Not on file.   Social History Main Topics  . Smoking status: Former Games developer  . Smokeless tobacco: Not on file   Comment: Widows, lives alone. 2 sons. 2 daughters. Retired Health visitor  . Alcohol Use: No  . Drug Use: No  . Sexually Active: Not on file   Other Topics Concern  . Not on file   Social History Narrative  . No narrative on file    Review of Systems:  All systems reviewed.  They are negative to the above problem except as previously stated.  Vital Signs: BP 138/64  Pulse 70  Resp 18  Ht 5' (1.524 m)  Wt 157 lb 12.8 oz (71.578 kg)  BMI 30.82 kg/m2  Physical Exam  HEENT:  Normocephalic, atraumatic. EOMI, PERRLA.  Neck: JVP is normal. No thyromegaly. No bruits.  Lungs: clear to auscultation. No rales no wheezes.  Heart: Regular rate and rhythm. Normal S1, S2. No S3.   No significant murmurs. PMI not displaced.  Abdomen:  Supple, nontender. Normal bowel sounds. No masses. No hepatomegaly.  Extremities:   Good distal pulses throughout. No lower extremity edema.  Musculoskeletal :moving all extremities.  Neuro:   alert and oriented x3.  CN II-XII grossly intact.  EKG:  SR.  64.  Low  voltage.   Assessment and Plan:

## 2011-04-30 NOTE — Assessment & Plan Note (Signed)
Excellent control on last check.

## 2011-04-30 NOTE — Assessment & Plan Note (Signed)
Good controll 

## 2011-05-06 DIAGNOSIS — M19019 Primary osteoarthritis, unspecified shoulder: Secondary | ICD-10-CM | POA: Diagnosis not present

## 2011-05-06 DIAGNOSIS — M75120 Complete rotator cuff tear or rupture of unspecified shoulder, not specified as traumatic: Secondary | ICD-10-CM | POA: Diagnosis not present

## 2011-05-19 DIAGNOSIS — M25569 Pain in unspecified knee: Secondary | ICD-10-CM | POA: Diagnosis not present

## 2011-05-19 DIAGNOSIS — M25559 Pain in unspecified hip: Secondary | ICD-10-CM | POA: Diagnosis not present

## 2011-05-19 DIAGNOSIS — M545 Low back pain, unspecified: Secondary | ICD-10-CM | POA: Diagnosis not present

## 2011-05-19 DIAGNOSIS — M76899 Other specified enthesopathies of unspecified lower limb, excluding foot: Secondary | ICD-10-CM | POA: Diagnosis not present

## 2011-05-28 DIAGNOSIS — M5137 Other intervertebral disc degeneration, lumbosacral region: Secondary | ICD-10-CM | POA: Diagnosis not present

## 2011-06-02 DIAGNOSIS — M5137 Other intervertebral disc degeneration, lumbosacral region: Secondary | ICD-10-CM | POA: Diagnosis not present

## 2011-06-02 DIAGNOSIS — M48061 Spinal stenosis, lumbar region without neurogenic claudication: Secondary | ICD-10-CM | POA: Diagnosis not present

## 2011-06-02 DIAGNOSIS — M25519 Pain in unspecified shoulder: Secondary | ICD-10-CM | POA: Diagnosis not present

## 2011-06-08 DIAGNOSIS — M47817 Spondylosis without myelopathy or radiculopathy, lumbosacral region: Secondary | ICD-10-CM | POA: Diagnosis not present

## 2011-06-16 DIAGNOSIS — M47817 Spondylosis without myelopathy or radiculopathy, lumbosacral region: Secondary | ICD-10-CM | POA: Diagnosis not present

## 2011-07-01 ENCOUNTER — Other Ambulatory Visit: Payer: Self-pay | Admitting: *Deleted

## 2011-07-01 MED ORDER — AMLODIPINE BESYLATE 5 MG PO TABS: 5.0000 mg | ORAL_TABLET | Freq: Every day | ORAL | Status: DC

## 2011-07-01 MED ORDER — METFORMIN HCL ER (MOD) 500 MG PO TB24: 500.0000 mg | ORAL_TABLET | Freq: Two times a day (BID) | ORAL | Status: DC

## 2011-07-01 MED ORDER — CARVEDILOL 6.25 MG PO TABS: 6.2500 mg | ORAL_TABLET | Freq: Two times a day (BID) | ORAL | Status: DC

## 2011-07-01 MED ORDER — DEXLANSOPRAZOLE 60 MG PO CPDR: 60.0000 mg | DELAYED_RELEASE_CAPSULE | Freq: Every day | ORAL | Status: DC

## 2011-07-03 ENCOUNTER — Ambulatory Visit (INDEPENDENT_AMBULATORY_CARE_PROVIDER_SITE_OTHER): Payer: Medicare Other | Admitting: Internal Medicine

## 2011-07-03 ENCOUNTER — Other Ambulatory Visit (INDEPENDENT_AMBULATORY_CARE_PROVIDER_SITE_OTHER): Payer: Medicare Other

## 2011-07-03 ENCOUNTER — Encounter: Payer: Self-pay | Admitting: Internal Medicine

## 2011-07-03 ENCOUNTER — Other Ambulatory Visit: Payer: Self-pay | Admitting: *Deleted

## 2011-07-03 VITALS — BP 132/62 | HR 76 | Temp 98.6°F | Wt 158.4 lb

## 2011-07-03 DIAGNOSIS — L723 Sebaceous cyst: Secondary | ICD-10-CM

## 2011-07-03 DIAGNOSIS — F4321 Adjustment disorder with depressed mood: Secondary | ICD-10-CM | POA: Diagnosis not present

## 2011-07-03 DIAGNOSIS — E785 Hyperlipidemia, unspecified: Secondary | ICD-10-CM | POA: Diagnosis not present

## 2011-07-03 DIAGNOSIS — E119 Type 2 diabetes mellitus without complications: Secondary | ICD-10-CM | POA: Diagnosis not present

## 2011-07-03 LAB — LIPID PANEL
Cholesterol: 180 mg/dL (ref 0–200)
HDL: 108.3 mg/dL (ref >39.00)
LDL Cholesterol: 61 mg/dL (ref 0–99)
Total CHOL/HDL Ratio: 2
Triglycerides: 55 mg/dL (ref 0.0–149.0)
VLDL: 11 mg/dL (ref 0.0–40.0)

## 2011-07-03 LAB — HEMOGLOBIN A1C: Hgb A1c MFr Bld: 6.2 % (ref 4.6–6.5)

## 2011-07-03 MED ORDER — CETIRIZINE HCL 10 MG PO TABS: 10.0000 mg | ORAL_TABLET | Freq: Every day | ORAL | Status: DC

## 2011-07-03 MED ORDER — CICLOPIROX 8 % EX SOLN: Freq: Every day | CUTANEOUS | Status: DC

## 2011-07-03 MED ORDER — RAMIPRIL 5 MG PO CAPS: 5.0000 mg | ORAL_CAPSULE | Freq: Every day | ORAL | Status: DC

## 2011-07-03 MED ORDER — ATORVASTATIN CALCIUM 40 MG PO TABS: 40.0000 mg | ORAL_TABLET | Freq: Every day | ORAL | Status: DC

## 2011-07-03 MED ORDER — ZOLPIDEM TARTRATE 5 MG PO TABS: 5.0000 mg | ORAL_TABLET | Freq: Every evening | ORAL | Status: DC | PRN

## 2011-07-03 MED ORDER — ALPRAZOLAM 0.25 MG PO TABS: 0.2500 mg | ORAL_TABLET | Freq: Three times a day (TID) | ORAL | Status: DC | PRN

## 2011-07-03 NOTE — Assessment & Plan Note (Signed)
Last lipids reviewed - at goal - check annually continue Lipitor as ongoing (hx CAD/CABG) 

## 2011-07-03 NOTE — Assessment & Plan Note (Signed)
On metformin -The current medical regimen (metformin) is effective;  continue present plan and medications. Check a1c -adjust as needed Lab Results  Component Value Date   HGBA1C 6.0 12/24/2010

## 2011-07-03 NOTE — Patient Instructions (Signed)
It was good to see you today. Use xanax as needed for anxiety/nerves and Ambien as need for sleep - Your prescription(s) have been submitted to your pharmacy. Please take as directed and contact our office if you believe you are having problem(s) with the medication(s). Test(s) ordered today. Your results will be called to you after review (48-72hours after test completion). If any changes need to be made, you will be notified at that time. we'll make referral to plastic surg for your chest cyst. Our office will contact you regarding appointment(s) once made. Please schedule followup in 4-6 months, call sooner if problems.

## 2011-07-03 NOTE — Progress Notes (Signed)
Subjective:    Patient ID: [name], female    DOB: 1930-09-14, 78 y.o.   MRN: [id number]  HPI  here for follow up - Reports death of son 06-Jul-2011, age 14yo, heart and kidney issues - very sad and tearful about same Also ?cyst on chest - not painful or change in size but wants removed  Also reviewed chronic medical issues:  anemia - ABL status post  R shoulder replacement 10/1910 No NSAIDs, melena or BRBPR complains of low energy and mild fatigue  HTN - reports compliance with ongoing medical treatment and no changes in medication dose or frequency. denies adverse side effects related to current therapy.   dyslipidemia - on lipitor - reports compliance with ongoing medical treatment and no changes in medication dose or frequency. denies adverse side effects related to current therapy.   DM2 - reports compliance with ongoing medical treatment and no changes in medication dose or frequency. denies adverse side effects related to current therapy. no hypoglycemia or hyperglycemia  diffuse, severe arthritis both inflammatory and DJD, dx never clarified in past but suspect RA - follows with rheum and ortho - participating in water areobics and PT - tolerable but constant pain with mild swelling in hands, wrists, knees and neck - also pain in both shoulders, but R improved s/p total joint 10/2010 replacment as rec by ortho.  uses hydrocodone sparingly  Past Medical History  Diagnosis Date  . S/P CABG x 1   . S/P inguinal hernia repair   . 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 CAD, NATIVE VESSEL 06/09/2008  . UNSPECIFIED ANEMIA   . DIABETES MELLITUS, TYPE II   . GERD   . HYPERLIPIDEMIA   . HYPERTENSION   . HIATAL HERNIA   . FIBROMYALGIA   . OA (osteoarthritis)     severe, shoulders, hands - inflammatory, ?RA    Review of Systems  Constitutional: Positive for fatigue. Negative for fever and unexpected weight change.  Respiratory: Negative for cough and shortness of breath.     Cardiovascular: Negative for chest pain.  Psychiatric/Behavioral: Positive for sleep disturbance, dysphoric mood and decreased concentration. Negative for confusion and self-injury.      Objective:   Physical Exam   BP 132/62  Pulse 76  Temp(Src) 98.6 F (37 C) (Oral)  Wt 158 lb 6.4 oz (71.85 kg)  SpO2 97% Constitutional: She is overweight; appears well-developed and well-nourished. sad.  Cardiovascular: Normal rate, regular rhythm and normal heart sounds.  No murmur heard. No BLE edema. Pulmonary/Chest: Effort normal and breath sounds normal. No respiratory distress. She has no wheezes. Abd: SNTND, +BS Musculoskeletal: Limited shoulder range of motion L, much improved on R; No gross deformities Neurological: She is alert and oriented to person, place, and time. No cranial nerve deficit. Coordination normal.  Skin: freely mobile fluid cyst 3mm round, non tender to R of sternotomy scar - Skin is warm and dry. No rash noted. No erythema.  Psychiatric: She has appropriately sad mood and tearful affect. Her behavior is normal. Judgment and thought content normal.  Lab Results  Component Value Date   WBC 6.9 02/10/2011   HGB 10.0* 02/10/2011   HCT 31.3* 02/10/2011   PLT 263.0 02/10/2011   CHOL 168 06/04/2010   TRIG 69.0 06/04/2010   HDL 84.70 06/04/2010   LDLDIRECT 115.3 09/30/2009   ALT 21 09/30/2009   AST 24 06/04/2010   NA 139 11/08/2010   K 3.5 11/08/2010   CL 104 11/08/2010   CREATININE  0.72 11/08/2010   BUN 11 11/08/2010   CO2 26 11/08/2010   TSH 0.88 07/23/2010   INR 0.99 11/06/2010   HGBA1C 6.0 12/24/2010   MICROALBUR 1.1 09/30/2009    Lab Results  Component Value Date   VITAMINB12 869 07/23/2010   Lab Results  Component Value Date   IRON 23* 02/10/2011    Assessment & Plan:   Grief reaction, situational stress/depression - prn xanax and refills on ambien - support offered  subcutaneous cyst at sternotomy scar - reassured re: benign nature of same but pt would like removed  - will refer to plastic surg  Also see problem list. Medications and labs reviewed today.

## 2011-07-03 NOTE — Progress Notes (Signed)
Addended by: Deatra James on: 07/03/2011 11:25 AM   Modules accepted: Orders

## 2011-07-06 DIAGNOSIS — M47817 Spondylosis without myelopathy or radiculopathy, lumbosacral region: Secondary | ICD-10-CM | POA: Diagnosis not present

## 2011-07-07 ENCOUNTER — Other Ambulatory Visit: Payer: Self-pay | Admitting: Internal Medicine

## 2011-07-07 DIAGNOSIS — L989 Disorder of the skin and subcutaneous tissue, unspecified: Secondary | ICD-10-CM

## 2011-07-08 ENCOUNTER — Other Ambulatory Visit: Payer: Self-pay | Admitting: *Deleted

## 2011-07-13 DIAGNOSIS — D485 Neoplasm of uncertain behavior of skin: Secondary | ICD-10-CM | POA: Diagnosis not present

## 2011-07-13 DIAGNOSIS — L723 Sebaceous cyst: Secondary | ICD-10-CM | POA: Diagnosis not present

## 2011-07-15 DIAGNOSIS — M5137 Other intervertebral disc degeneration, lumbosacral region: Secondary | ICD-10-CM | POA: Diagnosis not present

## 2011-07-15 DIAGNOSIS — M545 Low back pain, unspecified: Secondary | ICD-10-CM | POA: Diagnosis not present

## 2011-07-15 DIAGNOSIS — M19019 Primary osteoarthritis, unspecified shoulder: Secondary | ICD-10-CM | POA: Diagnosis not present

## 2011-07-21 DIAGNOSIS — M5137 Other intervertebral disc degeneration, lumbosacral region: Secondary | ICD-10-CM | POA: Diagnosis not present

## 2011-07-23 DIAGNOSIS — M5137 Other intervertebral disc degeneration, lumbosacral region: Secondary | ICD-10-CM | POA: Diagnosis not present

## 2011-07-28 DIAGNOSIS — M25519 Pain in unspecified shoulder: Secondary | ICD-10-CM | POA: Diagnosis not present

## 2011-07-28 DIAGNOSIS — M19019 Primary osteoarthritis, unspecified shoulder: Secondary | ICD-10-CM | POA: Diagnosis not present

## 2011-07-28 DIAGNOSIS — M5137 Other intervertebral disc degeneration, lumbosacral region: Secondary | ICD-10-CM | POA: Diagnosis not present

## 2011-07-29 ENCOUNTER — Ambulatory Visit
Admission: RE | Admit: 2011-07-29 | Discharge: 2011-07-29 | Disposition: A | Discharge status: Home / Self Care | Payer: Medicare Other | Source: Ambulatory Visit | Attending: Orthopedic Surgery | Admitting: Orthopedic Surgery

## 2011-07-29 ENCOUNTER — Other Ambulatory Visit: Payer: Self-pay | Admitting: Orthopedic Surgery

## 2011-07-29 DIAGNOSIS — M7989 Other specified soft tissue disorders: Secondary | ICD-10-CM | POA: Diagnosis not present

## 2011-07-29 DIAGNOSIS — R609 Edema, unspecified: Secondary | ICD-10-CM

## 2011-07-29 DIAGNOSIS — R52 Pain, unspecified: Secondary | ICD-10-CM

## 2011-07-29 DIAGNOSIS — M25519 Pain in unspecified shoulder: Secondary | ICD-10-CM | POA: Diagnosis not present

## 2011-07-29 DIAGNOSIS — M19019 Primary osteoarthritis, unspecified shoulder: Secondary | ICD-10-CM | POA: Diagnosis not present

## 2011-08-04 DIAGNOSIS — M25519 Pain in unspecified shoulder: Secondary | ICD-10-CM | POA: Diagnosis not present

## 2011-08-04 DIAGNOSIS — M19019 Primary osteoarthritis, unspecified shoulder: Secondary | ICD-10-CM | POA: Diagnosis not present

## 2011-08-11 DIAGNOSIS — M5137 Other intervertebral disc degeneration, lumbosacral region: Secondary | ICD-10-CM | POA: Diagnosis not present

## 2011-08-13 DIAGNOSIS — M5137 Other intervertebral disc degeneration, lumbosacral region: Secondary | ICD-10-CM | POA: Diagnosis not present

## 2011-08-17 DIAGNOSIS — M545 Low back pain, unspecified: Secondary | ICD-10-CM | POA: Diagnosis not present

## 2011-08-19 DIAGNOSIS — M19019 Primary osteoarthritis, unspecified shoulder: Secondary | ICD-10-CM | POA: Diagnosis not present

## 2011-08-25 ENCOUNTER — Encounter (HOSPITAL_COMMUNITY): Payer: Self-pay | Admitting: Pharmacy Technician

## 2011-08-31 DIAGNOSIS — L723 Sebaceous cyst: Secondary | ICD-10-CM | POA: Diagnosis not present

## 2011-08-31 DIAGNOSIS — L708 Other acne: Secondary | ICD-10-CM | POA: Diagnosis not present

## 2011-09-01 ENCOUNTER — Encounter (HOSPITAL_COMMUNITY)
Admission: RE | Admit: 2011-09-01 | Discharge: 2011-09-01 | Disposition: A | Discharge status: Home / Self Care | Payer: Medicare Other | Source: Ambulatory Visit | Attending: Orthopedic Surgery | Admitting: Orthopedic Surgery

## 2011-09-01 ENCOUNTER — Encounter (HOSPITAL_COMMUNITY): Payer: Self-pay

## 2011-09-01 ENCOUNTER — Other Ambulatory Visit: Payer: Self-pay | Admitting: Orthopedic Surgery

## 2011-09-01 HISTORY — DX: Insomnia, unspecified: G47.00

## 2011-09-01 LAB — BASIC METABOLIC PANEL
BUN: 9 mg/dL (ref 6–23)
CO2: 24 mEq/L (ref 19–32)
Calcium: 9.8 mg/dL (ref 8.4–10.5)
Chloride: 102 mEq/L (ref 96–112)
Creatinine, Ser: 0.64 mg/dL (ref 0.50–1.10)
GFR calc Af Amer: >90 mL/min (ref >90)
GFR calc non Af Amer: 83 mL/min — ABNORMAL LOW (ref >90)
Glucose, Bld: 106 mg/dL — ABNORMAL HIGH (ref 70–99)
Potassium: 4.9 mEq/L (ref 3.5–5.1)
Sodium: 137 mEq/L (ref 135–145)

## 2011-09-01 LAB — CBC
HCT: 37 % (ref 36.0–46.0)
Hemoglobin: 11.9 g/dL — ABNORMAL LOW (ref 12.0–15.0)
MCH: 27.4 pg (ref 26.0–34.0)
MCHC: 32.2 g/dL (ref 30.0–36.0)
MCV: 85.1 fL (ref 78.0–100.0)
Platelets: 206 10*3/uL (ref 150–400)
RBC: 4.35 MIL/uL (ref 3.87–5.11)
RDW: 16.4 % — ABNORMAL HIGH (ref 11.5–15.5)
WBC: 4.8 10*3/uL (ref 4.0–10.5)

## 2011-09-01 LAB — TYPE AND SCREEN
ABO/RH(D): O POS
Antibody Screen: NEGATIVE

## 2011-09-01 LAB — SURGICAL PCR SCREEN
MRSA, PCR: NEGATIVE
Staphylococcus aureus: NEGATIVE

## 2011-09-01 NOTE — Progress Notes (Signed)
I called Dr. Vito Berger requested orders for [name].

## 2011-09-01 NOTE — Pre-Procedure Instructions (Signed)
20 [name]  09/01/2011   Your procedure is scheduled on: Tuesday Sep 08, 2011.  Report to [facility] at 0530 AM.  Call this number if you have problems the morning of surgery: [phone]   Remember:   Do not eat food:After Midnight.  May have clear liquids: up to 4 Hours before arrival until 0130 am.  Clear liquids include soda, tea, black coffee, apple or grape juice, broth.  Take these medicines the morning of surgery with A SIP OF WATER: Amlodipine (Norvasc), Carvedilol (Coreg), and Dexlansoprazole (Dexilant)   Do not wear jewelry, make-up or nail polish.  Do not wear lotions, powders, or perfumes. You may wear deodorant.  Do not shave 48 hours prior to surgery. Men may shave face and neck.  Do not bring valuables to the hospital.  Contacts, dentures or bridgework may not be worn into surgery.  Leave suitcase in the car. After surgery it may be brought to your room.  For patients admitted to the hospital, checkout time is 11:00 AM the day of discharge.   Patients discharged the day of surgery will not be allowed to drive home.  Name and phone number of your driver:   Special Instructions: CHG Shower Use Special Wash: 1/2 bottle night before surgery and 1/2 bottle morning of surgery.   Please read over the following fact sheets that you were given: Pain Booklet, Coughing and Deep Breathing, Blood Transfusion Information, Total Joint Packet, MRSA Information and Surgical Site Infection Prevention

## 2011-09-08 ENCOUNTER — Ambulatory Visit (HOSPITAL_COMMUNITY): Payer: Medicare Other | Admitting: Certified Registered"

## 2011-09-08 ENCOUNTER — Encounter (HOSPITAL_COMMUNITY): Payer: Self-pay | Admitting: Certified Registered"

## 2011-09-08 ENCOUNTER — Inpatient Hospital Stay (HOSPITAL_COMMUNITY): Payer: Medicare Other

## 2011-09-08 ENCOUNTER — Encounter (HOSPITAL_COMMUNITY): Payer: Self-pay | Admitting: Orthopedic Surgery

## 2011-09-08 ENCOUNTER — Inpatient Hospital Stay (HOSPITAL_COMMUNITY)
Admission: RE | Admit: 2011-09-08 | Discharge: 2011-09-11 | DRG: 484 | Disposition: A | Discharge status: Skilled Nursing Facility | Payer: Medicare Other | Source: Ambulatory Visit | Attending: Orthopedic Surgery | Admitting: Orthopedic Surgery

## 2011-09-08 ENCOUNTER — Encounter (HOSPITAL_COMMUNITY): Payer: Self-pay | Admitting: *Deleted

## 2011-09-08 ENCOUNTER — Encounter (HOSPITAL_COMMUNITY)
Admission: RE | Disposition: A | Discharge status: Skilled Nursing Facility | Payer: Self-pay | Source: Ambulatory Visit | Attending: Orthopedic Surgery

## 2011-09-08 DIAGNOSIS — G8918 Other acute postprocedural pain: Secondary | ICD-10-CM | POA: Diagnosis not present

## 2011-09-08 DIAGNOSIS — Z96659 Presence of unspecified artificial knee joint: Secondary | ICD-10-CM

## 2011-09-08 DIAGNOSIS — I10 Essential (primary) hypertension: Secondary | ICD-10-CM | POA: Diagnosis not present

## 2011-09-08 DIAGNOSIS — I2510 Atherosclerotic heart disease of native coronary artery without angina pectoris: Secondary | ICD-10-CM | POA: Diagnosis not present

## 2011-09-08 DIAGNOSIS — E119 Type 2 diabetes mellitus without complications: Secondary | ICD-10-CM | POA: Diagnosis present

## 2011-09-08 DIAGNOSIS — IMO0001 Reserved for inherently not codable concepts without codable children: Secondary | ICD-10-CM | POA: Diagnosis present

## 2011-09-08 DIAGNOSIS — M719 Bursopathy, unspecified: Secondary | ICD-10-CM | POA: Diagnosis present

## 2011-09-08 DIAGNOSIS — M19012 Primary osteoarthritis, left shoulder: Secondary | ICD-10-CM

## 2011-09-08 DIAGNOSIS — Z96619 Presence of unspecified artificial shoulder joint: Secondary | ICD-10-CM | POA: Diagnosis not present

## 2011-09-08 DIAGNOSIS — M67919 Unspecified disorder of synovium and tendon, unspecified shoulder: Secondary | ICD-10-CM | POA: Diagnosis not present

## 2011-09-08 DIAGNOSIS — Z5189 Encounter for other specified aftercare: Secondary | ICD-10-CM | POA: Diagnosis not present

## 2011-09-08 DIAGNOSIS — Z951 Presence of aortocoronary bypass graft: Secondary | ICD-10-CM

## 2011-09-08 DIAGNOSIS — D649 Anemia, unspecified: Secondary | ICD-10-CM | POA: Diagnosis not present

## 2011-09-08 DIAGNOSIS — M19019 Primary osteoarthritis, unspecified shoulder: Principal | ICD-10-CM | POA: Diagnosis present

## 2011-09-08 DIAGNOSIS — Z471 Aftercare following joint replacement surgery: Secondary | ICD-10-CM | POA: Diagnosis not present

## 2011-09-08 DIAGNOSIS — E785 Hyperlipidemia, unspecified: Secondary | ICD-10-CM | POA: Diagnosis present

## 2011-09-08 HISTORY — PX: REVERSE SHOULDER ARTHROPLASTY: SHX5054

## 2011-09-08 LAB — GLUCOSE, CAPILLARY: Glucose-Capillary: 128 mg/dL — ABNORMAL HIGH (ref 70–99)

## 2011-09-08 SURGERY — ARTHROPLASTY, SHOULDER, TOTAL, REVERSE
Anesthesia: Choice | Site: Shoulder | Laterality: Left | Wound class: Clean

## 2011-09-08 MED ORDER — MEPERIDINE HCL 25 MG/ML IJ SOLN: 6.2500 mg | INTRAMUSCULAR | Status: DC | PRN

## 2011-09-08 MED ORDER — POLYETHYLENE GLYCOL 3350 17 G PO PACK
17.0000 g | PACK | Freq: Every day | ORAL | Status: DC
  Administered 2011-09-09 – 2011-09-11 (×3): 17 g via ORAL
  Filled 2011-09-08 (×3): qty 1

## 2011-09-08 MED ORDER — PHENOL 1.4 % MT LIQD: 1.0000 | OROMUCOSAL | Status: DC | PRN

## 2011-09-08 MED ORDER — LIDOCAINE HCL (CARDIAC) 20 MG/ML IV SOLN
INTRAVENOUS | Status: DC | PRN
  Administered 2011-09-08: 40 mg via INTRAVENOUS

## 2011-09-08 MED ORDER — MENTHOL 3 MG MT LOZG: 1.0000 | LOZENGE | OROMUCOSAL | Status: DC | PRN

## 2011-09-08 MED ORDER — PANTOPRAZOLE SODIUM 40 MG PO TBEC
40.0000 mg | DELAYED_RELEASE_TABLET | Freq: Every day | ORAL | Status: DC
  Administered 2011-09-09 – 2011-09-11 (×3): 40 mg via ORAL
  Filled 2011-09-08 (×3): qty 1

## 2011-09-08 MED ORDER — ADULT MULTIVITAMIN W/MINERALS CH
1.0000 | ORAL_TABLET | Freq: Every day | ORAL | Status: DC
  Administered 2011-09-09 – 2011-09-11 (×3): 1 via ORAL
  Filled 2011-09-08 (×3): qty 1

## 2011-09-08 MED ORDER — POLYSACCHARIDE IRON COMPLEX 150 MG PO CAPS
150.0000 mg | ORAL_CAPSULE | Freq: Two times a day (BID) | ORAL | Status: DC
  Administered 2011-09-08 – 2011-09-11 (×6): 150 mg via ORAL
  Filled 2011-09-08 (×8): qty 1

## 2011-09-08 MED ORDER — MELOXICAM 7.5 MG PO TABS
7.5000 mg | ORAL_TABLET | Freq: Every day | ORAL | Status: DC
  Administered 2011-09-08 – 2011-09-11 (×4): 7.5 mg via ORAL
  Filled 2011-09-08 (×4): qty 1

## 2011-09-08 MED ORDER — ONDANSETRON HCL 4 MG PO TABS: 4.0000 mg | ORAL_TABLET | Freq: Four times a day (QID) | ORAL | Status: DC | PRN

## 2011-09-08 MED ORDER — PROMETHAZINE HCL 25 MG/ML IJ SOLN: 6.2500 mg | INTRAMUSCULAR | Status: DC | PRN

## 2011-09-08 MED ORDER — RAMIPRIL 5 MG PO TABS: 5.0000 mg | ORAL_TABLET | Freq: Every day | ORAL | Status: DC

## 2011-09-08 MED ORDER — FENTANYL CITRATE 0.05 MG/ML IJ SOLN
INTRAMUSCULAR | Status: DC | PRN
  Administered 2011-09-08: 50 ug via INTRAVENOUS
  Administered 2011-09-08: 200 ug via INTRAVENOUS

## 2011-09-08 MED ORDER — ATORVASTATIN CALCIUM 40 MG PO TABS
40.0000 mg | ORAL_TABLET | Freq: Every day | ORAL | Status: DC
  Administered 2011-09-09 – 2011-09-10 (×2): 40 mg via ORAL
  Filled 2011-09-08 (×5): qty 1

## 2011-09-08 MED ORDER — ONDANSETRON HCL 4 MG/2ML IJ SOLN
INTRAMUSCULAR | Status: DC | PRN
  Administered 2011-09-08: 4 mg via INTRAVENOUS

## 2011-09-08 MED ORDER — ONDANSETRON HCL 4 MG/2ML IJ SOLN: 4.0000 mg | Freq: Four times a day (QID) | INTRAMUSCULAR | Status: DC | PRN

## 2011-09-08 MED ORDER — PROPOFOL 10 MG/ML IV EMUL
INTRAVENOUS | Status: DC | PRN
  Administered 2011-09-08: 10 mg via INTRAVENOUS
  Administered 2011-09-08: 100 mg via INTRAVENOUS
  Administered 2011-09-08: 30 mg via INTRAVENOUS

## 2011-09-08 MED ORDER — RAMIPRIL 5 MG PO CAPS
5.0000 mg | ORAL_CAPSULE | Freq: Every day | ORAL | Status: DC
  Administered 2011-09-08 – 2011-09-11 (×4): 5 mg via ORAL
  Filled 2011-09-08 (×5): qty 1

## 2011-09-08 MED ORDER — MIDAZOLAM HCL 5 MG/5ML IJ SOLN
INTRAMUSCULAR | Status: DC | PRN
  Administered 2011-09-08: 1 mg via INTRAVENOUS

## 2011-09-08 MED ORDER — METFORMIN HCL ER 500 MG PO TB24
500.0000 mg | ORAL_TABLET | Freq: Two times a day (BID) | ORAL | Status: DC
Start: 2011-09-08 — End: 2011-09-11
  Administered 2011-09-08 – 2011-09-11 (×6): 500 mg via ORAL
  Filled 2011-09-08 (×9): qty 1

## 2011-09-08 MED ORDER — ACETAMINOPHEN 650 MG RE SUPP: 650.0000 mg | Freq: Four times a day (QID) | RECTAL | Status: DC | PRN

## 2011-09-08 MED ORDER — AMLODIPINE BESYLATE 5 MG PO TABS
5.0000 mg | ORAL_TABLET | Freq: Every day | ORAL | Status: DC
  Administered 2011-09-09 – 2011-09-11 (×3): 5 mg via ORAL
  Filled 2011-09-08 (×3): qty 1

## 2011-09-08 MED ORDER — SENNA 8.6 MG PO TABS
1.0000 | ORAL_TABLET | Freq: Every day | ORAL | Status: DC
  Administered 2011-09-08 – 2011-09-11 (×4): 8.6 mg via ORAL
  Filled 2011-09-08 (×4): qty 1

## 2011-09-08 MED ORDER — BUPIVACAINE-EPINEPHRINE PF 0.5-1:200000 % IJ SOLN
INTRAMUSCULAR | Status: DC | PRN
  Administered 2011-09-08: 30 mL

## 2011-09-08 MED ORDER — SODIUM CHLORIDE 0.9 % IR SOLN
Status: DC | PRN
  Administered 2011-09-08: 1000 mL

## 2011-09-08 MED ORDER — LACTATED RINGERS IV SOLN
INTRAVENOUS | Status: DC | PRN
  Administered 2011-09-08: 07:00:00 via INTRAVENOUS

## 2011-09-08 MED ORDER — ACETAMINOPHEN 10 MG/ML IV SOLN
INTRAVENOUS | Status: AC
  Filled 2011-09-08: qty 100

## 2011-09-08 MED ORDER — MIDAZOLAM HCL 2 MG/2ML IJ SOLN
0.5000 mg | Freq: Once | INTRAMUSCULAR | Status: DC | PRN
Start: 2011-09-08 — End: 2011-09-08

## 2011-09-08 MED ORDER — CEFAZOLIN SODIUM 1-5 GM-% IV SOLN
1.0000 g | Freq: Four times a day (QID) | INTRAVENOUS | Status: AC
  Administered 2011-09-08 – 2011-09-09 (×3): 1 g via INTRAVENOUS
  Filled 2011-09-08 (×3): qty 50

## 2011-09-08 MED ORDER — OXYCODONE-ACETAMINOPHEN 5-325 MG PO TABS
1.0000 | ORAL_TABLET | ORAL | Status: DC | PRN
  Administered 2011-09-08: 1 via ORAL
  Administered 2011-09-08 – 2011-09-10 (×8): 2 via ORAL
  Administered 2011-09-11 (×2): 1 via ORAL
  Filled 2011-09-08: qty 1
  Filled 2011-09-08 (×2): qty 2
  Filled 2011-09-08 (×3): qty 1
  Filled 2011-09-08 (×6): qty 2

## 2011-09-08 MED ORDER — CEFAZOLIN SODIUM 1-5 GM-% IV SOLN
INTRAVENOUS | Status: DC | PRN
  Administered 2011-09-08: 1 g via INTRAVENOUS

## 2011-09-08 MED ORDER — ACETAMINOPHEN 10 MG/ML IV SOLN
INTRAVENOUS | Status: DC | PRN
  Administered 2011-09-08: 1000 mg via INTRAVENOUS

## 2011-09-08 MED ORDER — ACETAMINOPHEN 325 MG PO TABS: 650.0000 mg | ORAL_TABLET | Freq: Four times a day (QID) | ORAL | Status: DC | PRN

## 2011-09-08 MED ORDER — ROCURONIUM BROMIDE 100 MG/10ML IV SOLN
INTRAVENOUS | Status: DC | PRN
  Administered 2011-09-08: 50 mg via INTRAVENOUS

## 2011-09-08 MED ORDER — METOCLOPRAMIDE HCL 5 MG/ML IJ SOLN: 5.0000 mg | Freq: Three times a day (TID) | INTRAMUSCULAR | Status: DC | PRN

## 2011-09-08 MED ORDER — CARVEDILOL 6.25 MG PO TABS
6.2500 mg | ORAL_TABLET | Freq: Two times a day (BID) | ORAL | Status: DC
  Administered 2011-09-08 – 2011-09-11 (×5): 6.25 mg via ORAL
  Filled 2011-09-08 (×9): qty 1

## 2011-09-08 MED ORDER — MORPHINE SULFATE 2 MG/ML IJ SOLN
2.0000 mg | INTRAMUSCULAR | Status: DC | PRN
  Administered 2011-09-09: 2 mg via INTRAVENOUS
  Filled 2011-09-08: qty 1

## 2011-09-08 MED ORDER — CEFAZOLIN SODIUM 1-5 GM-% IV SOLN
INTRAVENOUS | Status: AC
  Filled 2011-09-08: qty 50

## 2011-09-08 MED ORDER — METOCLOPRAMIDE HCL 5 MG PO TABS
5.0000 mg | ORAL_TABLET | Freq: Three times a day (TID) | ORAL | Status: DC | PRN
  Filled 2011-09-08: qty 2

## 2011-09-08 MED ORDER — FENTANYL CITRATE 0.05 MG/ML IJ SOLN: 25.0000 ug | INTRAMUSCULAR | Status: DC | PRN

## 2011-09-08 MED ORDER — ZOLPIDEM TARTRATE 5 MG PO TABS
5.0000 mg | ORAL_TABLET | Freq: Every evening | ORAL | Status: DC | PRN
  Administered 2011-09-08 – 2011-09-10 (×3): 5 mg via ORAL
  Filled 2011-09-08 (×3): qty 1

## 2011-09-08 MED ORDER — ALUM & MAG HYDROXIDE-SIMETH 200-200-20 MG/5ML PO SUSP: 30.0000 mL | ORAL | Status: DC | PRN

## 2011-09-08 SURGICAL SUPPLY — 79 items
BIT DRILL 170X2.5X (BIT) IMPLANT
BIT DRL 170X2.5X (BIT) ×1
BLADE SAW SAG 73X25 THK (BLADE) ×1
BLADE SAW SGTL 73X25 THK (BLADE) ×1 IMPLANT
BOWL SMART MIX CTS (DISPOSABLE) IMPLANT
CEMENT BONE DEPUY (Cement) ×4 IMPLANT
CEMENT RESTRICTOR DEPUY SZ 3 (Cement) ×1 IMPLANT
CHLORAPREP W/TINT 26ML (MISCELLANEOUS) ×2 IMPLANT
CLOTH BEACON ORANGE TIMEOUT ST (SAFETY) ×2 IMPLANT
CLSR STERI-STRIP ANTIMIC 1/2X4 (GAUZE/BANDAGES/DRESSINGS) ×1 IMPLANT
CO AXIAL FAN SPRAY TIP SOFT SH (MISCELLANEOUS) ×1 IMPLANT
COVER SURGICAL LIGHT HANDLE (MISCELLANEOUS) ×2 IMPLANT
DRAPE INCISE IOBAN 66X45 STRL (DRAPES) ×2 IMPLANT
DRAPE SURG 17X23 STRL (DRAPES) ×2 IMPLANT
DRAPE U-SHAPE 47X51 STRL (DRAPES) ×2 IMPLANT
DRILL 2.5 (BIT) ×2
DRSG ADAPTIC 3X8 NADH LF (GAUZE/BANDAGES/DRESSINGS) ×1 IMPLANT
DRSG MEPILEX BORDER 4X8 (GAUZE/BANDAGES/DRESSINGS) ×1 IMPLANT
DRSG PAD ABDOMINAL 8X10 ST (GAUZE/BANDAGES/DRESSINGS) ×2 IMPLANT
ELECT REM PT RETURN 9FT ADLT (ELECTROSURGICAL) ×2
ELECTRODE REM PT RTRN 9FT ADLT (ELECTROSURGICAL) ×1 IMPLANT
EVACUATOR 1/8 PVC DRAIN (DRAIN) ×1 IMPLANT
GLOVE BIO SURGEON STRL SZ7 (GLOVE) ×2 IMPLANT
GLOVE BIO SURGEON STRL SZ7.5 (GLOVE) ×1 IMPLANT
GLOVE BIOGEL M 7.0 STRL (GLOVE) ×1 IMPLANT
GLOVE BIOGEL PI IND STRL 7.5 (GLOVE) IMPLANT
GLOVE BIOGEL PI IND STRL 8 (GLOVE) ×1 IMPLANT
GLOVE BIOGEL PI INDICATOR 7.5 (GLOVE) ×1
GLOVE BIOGEL PI INDICATOR 8 (GLOVE)
GLOVE BIOGEL PI ORTHO PRO SZ7 (GLOVE) ×1
GLOVE PI ORTHO PRO STRL SZ7 (GLOVE) IMPLANT
GLOVE SURG SS PI 7.0 STRL IVOR (GLOVE) ×1 IMPLANT
GOWN PREVENTION PLUS LG XLONG (DISPOSABLE) ×1 IMPLANT
GOWN PREVENTION PLUS XLARGE (GOWN DISPOSABLE) ×1 IMPLANT
GOWN STRL NON-REIN LRG LVL3 (GOWN DISPOSABLE) ×2 IMPLANT
HANDPIECE INTERPULSE COAX TIP (DISPOSABLE) ×4
HEMOSTAT SURGICEL 2X14 (HEMOSTASIS) IMPLANT
HOOD PEEL AWAY FACE SHEILD DIS (HOOD) ×4 IMPLANT
KIT BASIN OR (CUSTOM PROCEDURE TRAY) ×2 IMPLANT
KIT ROOM TURNOVER OR (KITS) ×2 IMPLANT
MANIFOLD NEPTUNE II (INSTRUMENTS) ×2 IMPLANT
NDL HYPO 25GX1X1/2 BEV (NEEDLE) ×1 IMPLANT
NDL MAYO TROCAR (NEEDLE) ×1 IMPLANT
NEEDLE HYPO 25GX1X1/2 BEV (NEEDLE) IMPLANT
NEEDLE MAYO TROCAR (NEEDLE) ×2 IMPLANT
NOZZLE PRISM 8.5MM (MISCELLANEOUS) ×2 IMPLANT
NS IRRIG 1000ML POUR BTL (IV SOLUTION) ×2 IMPLANT
PACK SHOULDER (CUSTOM PROCEDURE TRAY) ×2 IMPLANT
PAD ARMBOARD 7.5X6 YLW CONV (MISCELLANEOUS) ×4 IMPLANT
PIN GUIDE 1.2 (PIN) IMPLANT
PIN GUIDE GLENOPHERE 1.5MX300M (PIN) IMPLANT
PIN METAGLENE 2.5 (PIN) IMPLANT
RETRIEVER SUT HEWSON (MISCELLANEOUS) IMPLANT
SET HNDPC FAN SPRY TIP SCT (DISPOSABLE) ×1 IMPLANT
SLING ARM IMMOBILIZER LRG (SOFTGOODS) ×1 IMPLANT
SLING ARM IMMOBILIZER MED (SOFTGOODS) ×1 IMPLANT
SPONGE GAUZE 4X4 12PLY (GAUZE/BANDAGES/DRESSINGS) ×1 IMPLANT
SPONGE LAP 18X18 X RAY DECT (DISPOSABLE) ×2 IMPLANT
SPONGE LAP 4X18 X RAY DECT (DISPOSABLE) ×2 IMPLANT
STRIP CLOSURE SKIN 1/2X4 (GAUZE/BANDAGES/DRESSINGS) ×1 IMPLANT
SUCTION FRAZIER TIP 10 FR DISP (SUCTIONS) ×2 IMPLANT
SUPPORT WRAP ARM LG (MISCELLANEOUS) ×2 IMPLANT
SUT ETHIBOND 2 OS 4 DA (SUTURE) ×3 IMPLANT
SUT FIBERWIRE #2 38 T-5 BLUE (SUTURE) ×4
SUT MNCRL AB 4-0 PS2 18 (SUTURE) ×2 IMPLANT
SUT SILK 2 0 TIES 17X18 (SUTURE) ×2
SUT SILK 2-0 18XBRD TIE BLK (SUTURE) ×1 IMPLANT
SUT VIC AB 0 CTB1 27 (SUTURE) ×2 IMPLANT
SUT VIC AB 2-0 CT1 27 (SUTURE) ×2
SUT VIC AB 2-0 CT1 TAPERPNT 27 (SUTURE) ×2 IMPLANT
SUTURE FIBERWR #2 38 T-5 BLUE (SUTURE) ×4 IMPLANT
SYR CONTROL 10ML LL (SYRINGE) ×1 IMPLANT
SYRINGE TOOMEY DISP (SYRINGE) ×2 IMPLANT
TAPE CLOTH SURG 6X10 WHT LF (GAUZE/BANDAGES/DRESSINGS) ×1 IMPLANT
TOWEL OR 17X24 6PK STRL BLUE (TOWEL DISPOSABLE) ×1 IMPLANT
TOWEL OR 17X26 10 PK STRL BLUE (TOWEL DISPOSABLE) ×1 IMPLANT
TRAY FOLEY CATH 14FR (SET/KITS/TRAYS/PACK) IMPLANT
WATER STERILE IRR 1000ML POUR (IV SOLUTION) ×2 IMPLANT
YANKAUER SUCT BULB TIP NO VENT (SUCTIONS) ×2 IMPLANT

## 2011-09-08 NOTE — Anesthesia Preprocedure Evaluation (Addendum)
Anesthesia Evaluation  Patient identified by MRN, date of birth, ID band Patient awake    Reviewed: Allergy & Precautions, H&P , NPO status , Patient's Chart, lab work & pertinent test results, reviewed documented beta blocker date and time   Airway Mallampati: II TM Distance: >3 FB Neck ROM: Full    Dental No notable dental hx. (+) Dental Advisory Given   Pulmonary neg pulmonary ROS,  breath sounds clear to auscultation  Pulmonary exam normal       Cardiovascular hypertension, Pt. on medications and Pt. on home beta blockers + CAD (stable s/p CABG '10) and + CABG (2010) - Valvular Problems/MurmursRhythm:Regular Rate:Normal  '10 ECHO normal LVF, EF 50%   Neuro/Psych PSYCHIATRIC DISORDERS Depression negative neurological ROS     GI/Hepatic Neg liver ROS, GERD-  Medicated and Controlled,  Endo/Other  Diabetes mellitus- (glu 128), Well Controlled, Type 2, Oral Hypoglycemic Agents  Renal/GU negative Renal ROS     Musculoskeletal  (+) Arthritis -,   Abdominal   Peds  Hematology negative hematology ROS (+)   Anesthesia Other Findings   Reproductive/Obstetrics                          Anesthesia Physical Anesthesia Plan  ASA: III  Anesthesia Plan: General   Post-op Pain Management:    Induction: Intravenous  Airway Management Planned: Oral ETT  Additional Equipment:   Intra-op Plan:   Post-operative Plan: Extubation in OR  Informed Consent: I have reviewed the patients History and Physical, chart, labs and discussed the procedure including the risks, benefits and alternatives for the proposed anesthesia with the patient or authorized representative who has indicated his/her understanding and acceptance.   Dental advisory given  Plan Discussed with: CRNA, Anesthesiologist and Surgeon  Anesthesia Plan Comments: (Plan routine monitors, GETA with interscalene block for post op analgesia)        Anesthesia Quick Evaluation

## 2011-09-08 NOTE — Progress Notes (Signed)
UR COMPLETED  

## 2011-09-08 NOTE — Clinical Social Work Psychosocial (Signed)
     Clinical Social Work Department BRIEF PSYCHOSOCIAL ASSESSMENT 09/08/2011  Patient:  [name], [name]     Account Number:  [id number]     Admit date:  09/08/2011  Clinical Social Worker:  Dennison Bulla  Date/Time:  09/08/2011 02:45 PM  Referred by:  Physician  Date Referred:  09/08/2011 Referred for  SNF Placement   Other Referral:   Interview type:  Patient Other interview type:   Granddaughter present as well    PSYCHOSOCIAL DATA Living Status:  FAMILY Admitted from facility:   Level of care:   Primary support name:  Charmaine Primary support relationship to patient:  FAMILY Degree of support available:   Strong    CURRENT CONCERNS Current Concerns  Post-Acute Placement   Other Concerns:    SOCIAL WORK ASSESSMENT / PLAN CSW received referral from MD to assist with SNF placement. CSW reviewed chart which stated patient would need SNF after surgery. CSW met with patient and granddaughter at bedside.    CSW introduced myself and explained role at hospital. CSW explained MD recommendations and patient reported she had been at [facility] in the past and prefers to return at dc. Patient reports she has already been in contact with SNF and has plans to complete paperwork. CSW received permission to fax information to SNF. CSW provided family with SNF list and patient still agreeable to [facility].    Patient has existing pasarr number. CSW completed FL2 and sent information to SNF. CSW will continue to follow.   Assessment/plan status:  Psychosocial Support/Ongoing Assessment of Needs Other assessment/ plan:   Information/referral to community resources:   SNF list    PATIENTS/FAMILYS RESPONSE TO PLAN OF CARE: Patient was alert and oriented. Patient was pleasant and receptive to CSW visit. Patient agreeable to granddaughter participating in CSW assessment. Patient desires [facility] [employer] [employer].

## 2011-09-08 NOTE — H&P (Signed)
[name] is an 78 y.o. female.   Chief Complaint: L shoulder pain /dysfunction HPI: Endstage L rotator cuff tear arthropathy limiting quality of life, failed non operative treatment.  Past Medical History  Diagnosis Date  . S/P CABG x 1   . S/P inguinal hernia repair   . 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 CAD, NATIVE VESSEL 06/09/2008  . UNSPECIFIED ANEMIA   . DIABETES MELLITUS, TYPE II   . GERD   . HYPERLIPIDEMIA   . HYPERTENSION   . HIATAL HERNIA   . FIBROMYALGIA   . OA (osteoarthritis)     severe, shoulders, hands - inflammatory, ?RA  . Complication of anesthesia     Doesn't recognize family after and confusion after surgery.  . Gout   . Heart murmur   . Constipation   . Depression   . Insomnia     Past Surgical History  Procedure Date  . Hernia repair   . Abdominal hysterectomy 1981  . Coronary artery bypass graft 2010  . Appendectomy   . Replacement total knee bilateral   . Rotator cuff repair     Left  . Ovarian cyst removal   . Cataract extraction, bilateral   . Joint replacement     Family History  Problem Relation Age of Onset  . Cancer Mother     Stomach  . Heart disease Father 64    MI  . Diabetes Sister   . Heart disease Sister   . Heart attack Brother   . Anesthesia problems Neg Hx    Social History:  reports that she has quit smoking. She does not have any smokeless tobacco history on file. She reports that she does not drink alcohol or use illicit drugs.  Allergies: No Known Allergies  Medications Prior to Admission  Medication Sig Dispense Refill  . amLODipine (NORVASC) 5 MG tablet Take 5 mg by mouth daily.      Marland Kitchen atorvastatin (LIPITOR) 40 MG tablet Take 40 mg by mouth daily.      . carvedilol (COREG) 6.25 MG tablet Take 6.25 mg by mouth 2 (two) times daily with a meal.      . cetirizine (ZYRTEC) 10 MG tablet Take 10 mg by mouth daily.      . ciclopirox (PENLAC) 8 % solution Apply 1 application topically 3 times/day as  needed-between meals & bedtime. For nail fungus/Apply over nail and surrounding skin. Apply daily over previous coat. After seven (7) days, may remove with alcohol and continue cycle.      Marland Kitchen dexlansoprazole (DEXILANT) 60 MG capsule Take 60 mg by mouth daily.      . iron polysaccharides (NIFEREX) 150 MG capsule Take 150 mg by mouth 2 (two) times daily.      . meloxicam (MOBIC) 7.5 MG tablet Take 7.5 mg by mouth daily.      . metFORMIN (GLUMETZA) 500 MG (MOD) 24 hr tablet Take 500 mg by mouth 2 (two) times daily with a meal.      . Multiple Vitamin (MULITIVITAMIN WITH MINERALS) TABS Take 1 tablet by mouth daily.      . polyethylene glycol (MIRALAX / GLYCOLAX) packet Take 17 g by mouth daily.      . ramipril (ALTACE) 5 MG tablet Take 5 mg by mouth daily.      Marland Kitchen senna (SENOKOT) 8.6 MG TABS Take 1 tablet by mouth.      . zolpidem (AMBIEN) 5 MG tablet Take 5 mg by mouth at bedtime  as needed. For sleep        Results for orders placed during the hospital encounter of 09/08/11 (from the past 48 hour(s))  GLUCOSE, CAPILLARY     Status: Abnormal   Collection Time   09/08/11  6:23 AM      Component Value Range Comment   Glucose-Capillary 128 (*) 70 - 99 (mg/dL)    No results found.  Review of Systems  All other systems reviewed and are negative.    Blood pressure 159/83, pulse 63, temperature 97.8 F (36.6 C), temperature source Oral, resp. rate 18, SpO2 98.00%. Physical Exam  Constitutional: She is oriented to person, place, and time. She appears well-developed and well-nourished.  HENT:  Head: Atraumatic.  Eyes: EOM are normal.  Neck: Neck supple.  Cardiovascular: Intact distal pulses.   Respiratory: Effort normal.  GI: Soft.  Musculoskeletal:       Left shoulder: She exhibits decreased range of motion, tenderness, pain and decreased strength.  Neurological: She is alert and oriented to person, place, and time.  Skin: Skin is warm and dry.  Psychiatric: She has a normal mood and  affect.     Assessment/Plan L shoulder RCTA Plan for reverse TSA Risks / benefits of surgery discussed Consent on chart  NPO for OR Preop antibiotics Will need SNF post op  Mable Paris 09/08/2011, 7:17 AM

## 2011-09-08 NOTE — Anesthesia Procedure Notes (Signed)
Anesthesia Regional Block:  Interscalene brachial plexus block  Pre-Anesthetic Checklist: ,, timeout performed, Correct Patient, Correct Site, Correct Laterality, Correct Procedure, Correct Position, site marked, Risks and benefits discussed,  Surgical consent,  Pre-op evaluation,  At surgeon's request and post-op pain management  Laterality: Left  Prep: chloraprep       Needles:  Injection technique: Single-shot  Needle Type: Stimulator Needle - 40     Needle Length: 4cm  Needle Gauge: 22 and 22 G    Additional Needles:  Procedures: nerve stimulator Interscalene brachial plexus block  Nerve Stimulator or Paresthesia:  Response: forearm twitch, 0.45 mA, 0.1 ms,   Additional Responses:   Narrative:  Start time: 09/08/2011 7:15 AM End time: 09/08/2011 7:21 AM Injection made incrementally with aspirations every 5 mL.  Performed by: Personally  Anesthesiologist: Sandford Craze, MD  Additional Notes: Pt identified in Holding room.  Monitors applied. Working IV access confirmed. Sterile prep L neck.  #22ga PNS to forearm twitch at 0.23mA threshold.  30cc 0.5% Bupivacaine with 1:200k epi injected incrementally after negative test dose.  Patient asymptomatic, VSS, no heme aspirated, tolerated well.   Sandford Craze, MD

## 2011-09-08 NOTE — Anesthesia Postprocedure Evaluation (Signed)
  Anesthesia Post-op Note  Patient: [name]  Procedure(s) Performed: Procedure(s) (LRB): REVERSE SHOULDER ARTHROPLASTY (Left)  Patient Location: PACU  Anesthesia Type: GA combined with regional for post-op pain  Level of Consciousness: awake, alert  and oriented  Airway and Oxygen Therapy: Patient Spontanous Breathing and Patient connected to nasal cannula oxygen  Post-op Pain: none  Post-op Assessment: Post-op Vital signs reviewed, Patient's Cardiovascular Status Stable, Respiratory Function Stable, Patent Airway, No signs of Nausea or vomiting and Pain level controlled  Post-op Vital Signs: Reviewed and stable  Complications: No apparent anesthesia complications

## 2011-09-08 NOTE — Clinical Social Work Placement (Addendum)
    Clinical Social Work Department CLINICAL SOCIAL WORK PLACEMENT NOTE 09/08/2011  Patient:  [name], [name]  Account Number:  [id number] Admit date:  09/08/2011  Clinical Social Worker:  Unk Lightning, LCSW  Date/time:  09/08/2011 02:45 PM  Clinical Social Work is seeking post-discharge placement for this patient at the following level of care:   SKILLED NURSING   (*CSW will update this form in Epic as items are completed)   09/08/2011  Patient/family provided with [facility] Department of Clinical Social Work's list of facilities offering this level of care within the geographic area requested by the patient (or if unable, by the patient's family).  09/08/2011  Patient/family informed of their freedom to choose among providers that offer the needed level of care, that participate in Medicare, Medicaid or managed care program needed by the patient, have an available bed and are willing to accept the patient.  09/08/2011  Patient/family informed of [facility]' ownership interest in [facility], as well as of the fact that they are under no obligation to receive care at this facility.  PASARR submitted to EDS on 11/07/2010 PASARR number received from EDS on 11/07/2010  FL2 transmitted to all facilities in geographic area requested by pt/family on  09/08/2011 FL2 transmitted to all facilities within larger geographic area on   Patient informed that his/her managed care company has contracts with or will negotiate with  certain facilities, including the following:     Patient/family informed of bed offers received: 09/09/2011   Patient chooses bed at [facility] Physician recommends and patient chooses bed at    Patient to be transferred to [facility] on  09/11/2011 (JS) Patient to be transferred to facility by  Patient daughter (JS)  The following physician request were entered in Epic:   Additional Comments:  Macario Golds, LCSWA [phone]

## 2011-09-08 NOTE — Op Note (Signed)
Procedure(s): REVERSE SHOULDER ARTHROPLASTY Procedure Note  [name] female 78 y.o. 09/08/2011  Procedure(s) and Anesthesia Type:    * REVERSE SHOULDER ARTHROPLASTY - Choice  Surgeon(s) and Role:    * Mable Paris, MD - Primary   Indications:  78 y.o. female  With endstage left shoulder arthritis with irrepairable rotator cuff tear. Pain and dysfunction interfered with quality of life and nonoperative treatment with activity modification, NSAIDS and injections failed.     Surgeon: Mable Paris   Assistants: Skip Mayer PA-C (Blair's assistance was essential in retraction and positioning during the procedure)  Anesthesia: General endotracheal anesthesia and interscalene regional    Procedure Detail  REVERSE SHOULDER ARTHROPLASTY   Estimated Blood Loss:  200 mL         Drains: 1 medium hemovac  Blood Given: none          Specimens: none        Complications:  * No complications entered in OR log *         Disposition: PACU - hemodynamically stable.         Condition: stable      OPERATIVE FINDINGS:  A DePuy cemented reverse total shoulder arthroplasty was placed with a  size 10 stem, a 38 glenosphere, and a 3-mm poly insert. The base plate  fixation was excellent but only the superior and inferior screws were able to be placed. The central peg hole did penetrate medially. There was still an excellent press-fit.  PROCEDURE: The patient was identified in the preoperative holding area  where I personally marked the operative site after verifying site, side,  and procedure with the patient. An interscalene block given by  the attending anesthesiologist in the holding area and the patient was taken back to the operating room where all extremities were  carefully padded in position after general anesthesia was induced. She  was placed in a beach-chair position and the operative upper extremity was  prepped and draped in a standard  sterile fashion. An approximately 10-  cm incision was made from the tip of the coracoid process to the center  point of the humerus at the level of the axilla. Dissection was carried  down through subcutaneous tissues to the level of the cephalic vein  which was taken laterally with the deltoid. The pectoralis major was  retracted medially. The subdeltoid space was developed and the lateral  edge of the conjoined tendon was identified. The undersurface of  conjoined tendon was palpated and the musculocutaneous nerve was not in  the field. Retractor was placed underneath the conjoined and second  retractor was placed lateral into the deltoid. The circumflex humeral  artery and vessels were identified and clamped and coagulated. The  biceps tendon was already ruptured.  The subscapularis was torn.  The  joint was then gently externally rotated while the capsule was released  from the humeral neck around to just beyond the 6 o'clock position. At  this point, the joint was dislocated and the humeral head was presented  into the wound. The excessive osteophyte formation was removed with a  large rongeur and the intramedullary canal was then entered with  sequential reamers up to a 10-mm reamer which was felt to be the  appropriate size. There was significant deformity of the proximal humerus with flattening of the humeral head. The cutting guide was used to make the appropriate  head cut . The proximal humerus was prepared with the appropriate reamers and the  trial implant was placed. the glenoid was exposed with the arm in an  abducted extended position. The anterior and posterior labrum were  completely excised and the capsule was released circumferentially to  allow for exposure of the glenoid for preparation. The guidepin was  placed using the guide and the reamer was  placed over the guidepin to ream down to concentric surface. Superior  hand reamer was used as well. The central drill hole  was then made and  Did penetrate the glenoid vault.  The Metaglene was then impacted with  excellent press fit. The superior and inferior screws were then  drilled, measured, and filled with appropriate-sized locking screw  alternatively tightening top and bottom to bring the Metaglene down  tightly against the bone. Both screws had excellent fixation Posterior and anterior screws were not able to be placed. The  glenosphere was then impacted over the [facility] taper and tightened down  with the screw. The humerus was then again exposed and the humerus was  prepared for implantation of the component. Cement restrictor  was placed and the canal was cleaned and dried. The stem was  cemented into place. The poly trials were tested and the above implant was felt to be the most appropriate soft tissue tensioning with excellent stability and  excellent range of motion. Therefore, the polyethylene component  was implanted and taken through full range of motion with excellent  stability and motion. A medium Hemovac drain was placed out underneath the deltoid prior to closure. The joint was then copiously irrigated with pulse  lavage and the wound was then closed. The subscapularis was not repaired.  Skin was closed with 2-0 Vicryl in a deep dermal layer and 4-0  Monocryl for skin closure. Steri-Strips were applied.terile  dressings were then applied as well as a sling. The patient was allowed  to awaken from general anesthesia, transferred to stretcher, and taken  to recovery room in stable condition.   POSTOPERATIVE PLAN: She will be kept in the hospital postoperatively  for pain control and therapy. She'll need to be transferred to a SNF postoperatively given her lack of help at home.

## 2011-09-08 NOTE — Transfer of Care (Signed)
Immediate Anesthesia Transfer of Care Note  Patient: [name]  Procedure(s) Performed: Procedure(s) (LRB): REVERSE SHOULDER ARTHROPLASTY (Left)  Patient Location: PACU  Anesthesia Type: GA combined with regional for post-op pain  Level of Consciousness: awake, alert  and oriented  Airway & Oxygen Therapy: Patient Spontanous Breathing and Patient connected to nasal cannula oxygen  Post-op Assessment: Report given to PACU RN, Post -op Vital signs reviewed and stable and Patient moving all extremities  Post vital signs: Reviewed and stable  Complications: No apparent anesthesia complications

## 2011-09-08 NOTE — Preoperative (Signed)
Beta Blockers   Reason not to administer Beta Blockers:Coreg at 0500 hrs 08 Sep 2011

## 2011-09-09 ENCOUNTER — Encounter (HOSPITAL_COMMUNITY): Payer: Self-pay | Admitting: Orthopedic Surgery

## 2011-09-09 LAB — BASIC METABOLIC PANEL
BUN: 15 mg/dL (ref 6–23)
CO2: 25 mEq/L (ref 19–32)
Calcium: 8.1 mg/dL — ABNORMAL LOW (ref 8.4–10.5)
Chloride: 99 mEq/L (ref 96–112)
Creatinine, Ser: 0.96 mg/dL (ref 0.50–1.10)
GFR calc Af Amer: 64 mL/min — ABNORMAL LOW (ref >90)
GFR calc non Af Amer: 55 mL/min — ABNORMAL LOW (ref >90)
Glucose, Bld: 132 mg/dL — ABNORMAL HIGH (ref 70–99)
Potassium: 3.6 mEq/L (ref 3.5–5.1)
Sodium: 134 mEq/L — ABNORMAL LOW (ref 135–145)

## 2011-09-09 LAB — CBC
HCT: 28.5 % — ABNORMAL LOW (ref 36.0–46.0)
Hemoglobin: 9.3 g/dL — ABNORMAL LOW (ref 12.0–15.0)
MCH: 27.9 pg (ref 26.0–34.0)
MCHC: 32.6 g/dL (ref 30.0–36.0)
MCV: 85.6 fL (ref 78.0–100.0)
Platelets: 131 10*3/uL — ABNORMAL LOW (ref 150–400)
RBC: 3.33 MIL/uL — ABNORMAL LOW (ref 3.87–5.11)
RDW: 16.2 % — ABNORMAL HIGH (ref 11.5–15.5)
WBC: 7.3 10*3/uL (ref 4.0–10.5)

## 2011-09-09 NOTE — Progress Notes (Signed)
Clinical Social Work  CSW spoke with [employer] who is agreeable to patient admitting to facility. CSW informed patient of SNF decision and patient is still agreeable. CSW will continue to follow to assist with dc needs.  [place], [place] [phone] (Coverage for Lovette Cliche)

## 2011-09-09 NOTE — Progress Notes (Signed)
PATIENT ID: MARKALA SITTS  MRN: [id number]  DOB/AGE:  [birth year] / 76 y.o.  1 Day Post-Op Procedure(s) (LRB): REVERSE SHOULDER ARTHROPLASTY (Left)  Subjective: Pain is moderate.  No c/o chest pain or SOB.   Block wore off overnight.   Objective: Vital signs in last 24 hours: Temp:  [96.8 F (36 C)-99.8 F (37.7 C)] 99.8 F (37.7 C) (05/22 0606) Pulse Rate:  [62-93] 76  (05/22 0606) Resp:  [18] 18  (05/22 0606) BP: (104-129)/(40-70) 105/62 mmHg (05/22 0606) SpO2:  [92 %-97 %] 95 % (05/22 0606) Weight:  [71.578 kg (157 lb 12.8 oz)] 71.578 kg (157 lb 12.8 oz) (05/21 1324)  Intake/Output from previous day: 05/21 0701 - 05/22 0700 In: 1380 [P.O.:480; I.V.:850; IV Piggyback:50] Out: 250 [Drains:100; Blood:150] Intake/Output this shift: Total I/O In: 240 [P.O.:240] Out: -   No results found for this basename: HGB:5 in the last 72 hours No results found for this basename: WBC:2,RBC:2,HCT:2,PLT:2 in the last 72 hours No results found for this basename: NA:2,K:2,CL:2,CO2:2,BUN:2,CREATININE:2,GLUCOSE:2,CALCIUM:2 in the last 72 hours No results found for this basename: LABPT:2,INR:2 in the last 72 hours  Physical Exam: Neurovascular intact Intact pulses distally Incision: dressing C/D/I   Assessment/Plan: 1 Day Post-Op Procedure(s) (LRB): REVERSE SHOULDER ARTHROPLASTY (Left)   Advance diet Up with therapy  VTE prophylaxis: intermittent pneumatic compression boots PT/OT today Drain d/c'd Plan SNF Friday.   Mable Paris 09/09/2011, 6:41 AM

## 2011-09-09 NOTE — Evaluation (Signed)
Occupational Therapy Evaluation Patient Details Name: MONE COMMISSO MRN: [id number] DOB: 29-Aug-1930 Today's Date: 09/09/2011 Time:  -     OT Assessment / Plan / Recommendation Clinical Impression  Pt s/p right reverse total shoulder and presents with generalized weakness, pain, and overall decreased independence with ADLs. Will benefit from skilled OT in the acute setting to maximize I with ADL and ADL mobility, LUE exercises and sling management prior to d/c. Plan is to d/c to ST-SNF prior to returning home    OT Assessment  Patient needs continued OT Services    Follow Up Recommendations  Skilled nursing facility    Barriers to Discharge Decreased caregiver support    Equipment Recommendations  Defer to next venue    Recommendations for Other Services    Frequency  Min 2X/week    Precautions / Restrictions Precautions Precautions: Fall (mild) Restrictions Weight Bearing Restrictions: Yes   Pertinent Vitals/Pain Pt reports 5/10 shoulder pain with activity; pre-medicated    ADL  Eating/Feeding: Set up;Supervision/safety Where Assessed - Eating/Feeding: Edge of bed Grooming: Simulated;Minimal assistance Where Assessed - Grooming: Unsupported standing Upper Body Bathing: Simulated;Minimal assistance Where Assessed - Upper Body Bathing: Unsupported sitting Lower Body Bathing: Simulated;Moderate assistance Where Assessed - Lower Body Bathing: Unsupported sit to stand Lower Body Dressing: Simulated;Moderate assistance Where Assessed - Lower Body Dressing: Unsupported sit to stand Toilet Transfer: Simulated;Minimal assistance (simulated to/from bed) Toilet Transfer Method: Sit to stand Toileting - Clothing Manipulation and Hygiene: Simulated;Minimal assistance Where Assessed - Glass blower/designer Manipulation and Hygiene: Standing Equipment Used: Gait belt Transfers/Ambulation Related to ADLs: Min guard A with ambulation greater than 161ft ADL Comments: Pt doing very well;  expect good progress    OT Diagnosis: Generalized weakness;Acute pain  OT Problem List: Decreased strength;Decreased range of motion;Decreased activity tolerance;Impaired balance (sitting and/or standing);Decreased knowledge of use of DME or AE;Decreased knowledge of precautions;Pain;Impaired UE functional use OT Treatment Interventions: Self-care/ADL training;Therapeutic exercise;DME and/or AE instruction;Therapeutic activities;Patient/family education;Balance training   OT Goals Acute Rehab OT Goals OT Goal Formulation: With patient Time For Goal Achievement: 09/23/11 Potential to Achieve Goals: Good ADL Goals Pt Will Transfer to Toilet: Independently;Ambulation;Regular height toilet ADL Goal: Toilet Transfer - Progress: Goal set today Pt Will Perform Toileting - Hygiene: with modified independence;Sitting on 3-in-1 or toilet ADL Goal: Toileting - Hygiene - Progress: Goal set today Additional ADL Goal #1: Pt will I'ly verbalize/demonstrate UB ADL adaptations. ADL Goal: Additional Goal #1 - Progress: Goal set today Additional ADL Goal #2: Pt will I'ly verbalize/demonstrate LUE sling management. ADL Goal: Additional Goal #2 - Progress: Goal set today Arm Goals Pt Will Perform AROM: Independently;3 sets;10 reps;Left upper extremity Arm Goal: AROM - Progress: Goal set today  Visit Information  Last OT Received On: 09/09/11 Assistance Needed: +1    Subjective Data      Prior Functioning  Home Living Lives With: Alone Available Help at Discharge: Available PRN/intermittently;Family Type of Home: House Home Access: Stairs to enter [employer] of Steps: 3 Entrance Stairs-Rails: Right Home Layout: One level Bathroom Shower/Tub: Walk-in shower;Door [employer] Toilet: Standard Home Adaptive Equipment: Built-in shower seat;Straight cane Additional Comments: could stay with family for short period of time if needed Prior Function Level of Independence: Independent Able to  Take Stairs?: Reciprically Driving: Yes Vocation: Retired Musician: No difficulties Dominant Hand: Right    Cognition  Overall Cognitive Status: Appears within functional limits for tasks assessed/performed Arousal/Alertness: Awake/alert Orientation Level: Oriented X4 / Intact Behavior During Session: [facility] for tasks performed  Extremity/Trunk Assessment Right Upper Extremity Assessment RUE ROM/Strength/Tone: Within functional levels RUE Sensation: WFL - Light Touch RUE Coordination: WFL - gross/fine motor Left Upper Extremity Assessment LUE ROM/Strength/Tone: Unable to fully assess;Due to pain;Due to precautions LUE Sensation: WFL - Light Touch LUE Coordination:  (gross not assess; FM- limited by pain) Right Lower Extremity Assessment RLE ROM/Strength/Tone: Within functional levels Left Lower Extremity Assessment LLE ROM/Strength/Tone: Within functional levels Trunk Assessment Trunk Assessment: Normal   Mobility Bed Mobility Bed Mobility: Sit to Supine;Sitting - Scoot to Edge of Bed;Supine to Sit Supine to Sit: 4: Min assist;With rails;HOB flat Sitting - Scoot to [place] of Bed: 5: Supervision Sit to Supine: 4: Min assist Details for Bed Mobility Assistance: pt able to scoot posteriorly on the bed and complete the pivot, but needed very minimal assist to get her feet up on the bed and then get positioned; vc's for general technique Transfers Sit to Stand: 4: Min assist Stand to Sit: 4: Min guard Details for Transfer Assistance: vc's to reinforce R UE use only; otherwise stand by for safety only   Exercise Shoulder Exercises Elbow Flexion: AAROM;10 reps;Left;Seated Elbow Extension: AAROM;Left;10 reps;Seated Wrist Flexion: AROM;10 reps;Left;Seated Wrist Extension: AROM;Left;10 reps;Seated Digit Composite Flexion: AROM;10 reps;Left;Seated Composite Extension: AROM;Left;10 reps;Seated  Balance    End of Session OT - End of Session Equipment Utilized During  Treatment: Gait belt Activity Tolerance: Patient tolerated treatment well Patient left: in bed;with call bell/phone within reach Nurse Communication: Mobility status   Hortencia Martire 09/09/2011, 1:50 PM

## 2011-09-09 NOTE — Evaluation (Signed)
Physical Therapy Evaluation Patient Details Name: [name] MRN: [id number] DOB: 01/04/1931 Today's Date: 09/09/2011 Time: 4098-1191 PT Time Calculation (min): 23 min  PT Assessment / Plan / Recommendation Clinical Impression  pt admitted with failed conservaqtive management of the L shd, s/p rev. shd arthroplasty.  Presently, mobility hindered by  inability to use L UE and general weakness making abscense of one limb more difficult to mobilize.  Rec PT at SNF and f/u OP PT.    PT Assessment  Patient needs continued PT services    Follow Up Recommendations  Skilled nursing facility;Other (comment) (then OPPT  after SNF)    Barriers to Discharge Decreased caregiver support      lEquipment Recommendations  Defer to next venue    Recommendations for Other Services     Frequency Min 3X/week    Precautions / Restrictions Precautions Precautions: Fall (mild) Restrictions Weight Bearing Restrictions: Yes   Pertinent Vitals/Pain       Mobility  Bed Mobility Sitting - Scoot to Edge of Bed: 5: Supervision Sit to Supine: 4: Min assist Details for Bed Mobility Assistance: pt able to scoot posteriorly on the bed and complete the pivot, but needed very minimal assist to get her feet up on the bed and then get positioned; vc's for general technique Transfers Transfers: Sit to Stand;Stand to Sit Sit to Stand: 4: Min assist Stand to Sit: 4: Min guard Details for Transfer Assistance: vc's to reinforce R UE use only; otherwise stand by for safety only Ambulation/Gait Ambulation/Gait Assistance: 4: Min guard Ambulation Distance (Feet): 240 Feet Assistive device: None Ambulation/Gait Assistance Details: gait characterized by short step length, decr time on the L,  shorter , mildly more effortful advancement of R LE Gait Pattern: Step-through pattern;Decreased step length - right;Decreased stance time - left;Decreased stride length;Narrow base of support Stairs: No Wheelchair  Mobility Wheelchair Mobility: No    Exercises     PT Diagnosis: Difficulty walking;Generalized weakness;Acute pain  PT Problem List: Decreased strength;Decreased range of motion;Decreased activity tolerance;Decreased balance;Decreased mobility;Pain PT Treatment Interventions: Gait training;Functional mobility training;Therapeutic activities;Balance training;Patient/family education   PT Goals Acute Rehab PT Goals PT Goal Formulation: With patient Time For Goal Achievement: 09/09/11 Potential to Achieve Goals: Good Pt will go Supine/Side to Sit: Independently PT Goal: Supine/Side to Sit - Progress: Goal set today Pt will go Sit to Stand: Independently PT Goal: Sit to Stand - Progress: Goal set today Pt will Ambulate: >150 feet;with modified independence;with least restrictive assistive device  Visit Information  Last PT Received On: 09/09/11 Assistance Needed: +1 PT/OT Co-Evaluation/Treatment: Yes    Subjective Data  Patient Stated Goal: back home eventually  and able to be independent   Prior Functioning  Home Living Lives With: Alone Available Help at Discharge: Available PRN/intermittently;Family Type of Home: House Home Access: Stairs to enter [employer] of Steps: 3 Entrance Stairs-Rails: Right Home Layout: One level Bathroom Shower/Tub: Walk-in shower;Door [employer] Toilet: Standard Home Adaptive Equipment: Built-in shower seat;Straight cane Additional Comments: could stay with family for short period of time if needed Prior Function Level of Independence: Independent Able to Take Stairs?: Reciprically Driving: Yes Vocation: Retired Musician: No difficulties Dominant Hand: Right    Cognition  Overall Cognitive Status: Appears within functional limits for tasks assessed/performed Arousal/Alertness: Awake/alert Orientation Level: Oriented X4 / Intact Behavior During Session: [facility] for tasks performed    Extremity/Trunk Assessment  Right Lower Extremity Assessment RLE ROM/Strength/Tone: Within functional levels Left Lower Extremity Assessment LLE ROM/Strength/Tone: Within functional  levels Trunk Assessment Trunk Assessment: Normal   Balance    End of Session PT - End of Session Equipment Utilized During Treatment:  (L sling) Activity Tolerance: Patient tolerated treatment well Patient left: in chair;with call bell/phone within reach Nurse Communication: Mobility status   Justinn Welter, Eliseo Gum 09/09/2011, 1:43 PM  09/09/2011  Lowrys Bing, PT [phone] (pager)

## 2011-09-10 MED ORDER — ASPIRIN EC 325 MG PO TBEC
325.0000 mg | DELAYED_RELEASE_TABLET | Freq: Every day | ORAL | Status: DC
  Administered 2011-09-10 – 2011-09-11 (×2): 325 mg via ORAL
  Filled 2011-09-10 (×2): qty 1

## 2011-09-10 NOTE — Progress Notes (Signed)
PATIENT ID: [name]  MRN: [id number]  DOB/AGE:  Nov 05, 1930 / 78 y.o.  2 Days Post-Op Procedure(s) (LRB): REVERSE SHOULDER ARTHROPLASTY (Left)  Subjective: Pain is mild.  No c/o chest pain or SOB.   Doing well.    Objective: Vital signs in last 24 hours: Temp:  [98 F (36.7 C)-99.3 F (37.4 C)] 99.3 F (37.4 C) (05/23 0516) Pulse Rate:  [62-86] 86  (05/23 0516) Resp:  [18] 18  (05/23 0516) BP: (85-127)/(51-67) 127/67 mmHg (05/23 0516) SpO2:  [97 %-98 %] 97 % (05/23 0516)  Intake/Output from previous day: 05/22 0701 - 05/23 0700 In: 960 [P.O.:960] Out: -  Intake/Output this shift:     Basename 09/09/11 0640  HGB 9.3*    Basename 09/09/11 0640  WBC 7.3  RBC 3.33*  HCT 28.5*  PLT 131*    Basename 09/09/11 0640  NA 134*  K 3.6  CL 99  CO2 25  BUN 15  CREATININE 0.96  GLUCOSE 132*  CALCIUM 8.1*   No results found for this basename: LABPT:2,INR:2 in the last 72 hours  Physical Exam: Neurovascular intact Incision: dressing C/D/I No cellulitis present  Assessment/Plan: 2 Days Post-Op Procedure(s) (LRB): REVERSE SHOULDER ARTHROPLASTY (Left)   Up with therapy Plan for discharge tomorrow to SNF   [facility] 09/10/2011, 8:06 AM

## 2011-09-11 DIAGNOSIS — M19012 Primary osteoarthritis, left shoulder: Secondary | ICD-10-CM

## 2011-09-11 DIAGNOSIS — I10 Essential (primary) hypertension: Secondary | ICD-10-CM | POA: Diagnosis not present

## 2011-09-11 DIAGNOSIS — Z96619 Presence of unspecified artificial shoulder joint: Secondary | ICD-10-CM | POA: Diagnosis not present

## 2011-09-11 DIAGNOSIS — Z951 Presence of aortocoronary bypass graft: Secondary | ICD-10-CM | POA: Diagnosis not present

## 2011-09-11 DIAGNOSIS — M19019 Primary osteoarthritis, unspecified shoulder: Secondary | ICD-10-CM | POA: Diagnosis not present

## 2011-09-11 DIAGNOSIS — I2510 Atherosclerotic heart disease of native coronary artery without angina pectoris: Secondary | ICD-10-CM | POA: Diagnosis not present

## 2011-09-11 DIAGNOSIS — Z5189 Encounter for other specified aftercare: Secondary | ICD-10-CM | POA: Diagnosis not present

## 2011-09-11 DIAGNOSIS — D649 Anemia, unspecified: Secondary | ICD-10-CM | POA: Diagnosis not present

## 2011-09-11 DIAGNOSIS — IMO0001 Reserved for inherently not codable concepts without codable children: Secondary | ICD-10-CM | POA: Diagnosis not present

## 2011-09-11 DIAGNOSIS — E785 Hyperlipidemia, unspecified: Secondary | ICD-10-CM | POA: Diagnosis not present

## 2011-09-11 DIAGNOSIS — E119 Type 2 diabetes mellitus without complications: Secondary | ICD-10-CM | POA: Diagnosis not present

## 2011-09-11 LAB — GLUCOSE, CAPILLARY: Glucose-Capillary: 206 mg/dL — ABNORMAL HIGH (ref 70–99)

## 2011-09-11 MED ORDER — ASPIRIN 325 MG PO TBEC: 325.0000 mg | DELAYED_RELEASE_TABLET | Freq: Every day | ORAL | Status: DC

## 2011-09-11 MED ORDER — OXYCODONE-ACETAMINOPHEN 5-325 MG PO TABS: 1.0000 | ORAL_TABLET | ORAL | Status: DC | PRN

## 2011-09-11 MED ORDER — OXYCODONE-ACETAMINOPHEN 5-325 MG PO TABS: 1.0000 | ORAL_TABLET | ORAL | Status: AC | PRN

## 2011-09-11 NOTE — Progress Notes (Signed)
Physical Therapy Treatment Patient Details Name: SAHER DAVEE MRN: [id number] DOB: [birth year] Today's Date: 09/11/2011 Time: 7425-9563 PT Time Calculation (min): 10 min  PT Assessment / Plan / Recommendation Comments on Treatment Session  pt presents with Reverse Shoulder L.  pt very motivated and notes plan is to D/C to SNF at 1pm today.      Follow Up Recommendations  Skilled nursing facility    Barriers to Discharge        Equipment Recommendations  Defer to next venue    Recommendations for Other Services    Frequency Min 3X/week   Plan Discharge plan remains appropriate;Frequency remains appropriate    Precautions / Restrictions Precautions Precautions: Fall Restrictions Weight Bearing Restrictions: Yes LUE Weight Bearing: Non weight bearing   Pertinent Vitals/Pain     Mobility  Bed Mobility Bed Mobility: Not assessed Transfers Transfers: Sit to Stand;Stand to Sit Sit to Stand: 4: Min guard;From chair/3-in-1;With upper extremity assist Stand to Sit: 4: Min guard;With upper extremity assist;To bed Details for Transfer Assistance: Mod verbal cues for not using left UE during transfers. Ambulation/Gait Ambulation/Gait Assistance: 4: Min guard Ambulation Distance (Feet): 250 Feet Assistive device: None Ambulation/Gait Assistance Details: pt moves slow with short step length and slightly wider BOS.   Gait Pattern: Step-through pattern;Decreased stride length;Wide base of support Stairs: No Wheelchair Mobility Wheelchair Mobility: No        PT Goals Acute Rehab PT Goals Time For Goal Achievement: 09/16/11 PT Goal: Sit to Stand - Progress: Progressing toward goal PT Goal: Ambulate - Progress: Progressing toward goal  Visit Information  Last PT Received On: 09/11/11 Assistance Needed: +1    Subjective Data  Subjective: Now, I want to try to do as much as I can on my own.     Cognition  Overall Cognitive Status: Appears within functional limits for  tasks assessed/performed Arousal/Alertness: Awake/alert Orientation Level: Oriented X4 / Intact Behavior During Session: [facility] for tasks performed    Balance  Balance Balance Assessed: No  End of Session PT - End of Session Equipment Utilized During Treatment:  (L sling) Activity Tolerance: Patient tolerated treatment well Patient left: in bed;with call bell/phone within reach (sitting EOB with OT) Nurse Communication: Mobility status    RitenourAlison Murray, Malcolm [phone] 09/11/2011, 10:45 AM

## 2011-09-11 NOTE — Progress Notes (Signed)
PATIENT ID: [name]  MRN: [id number]  DOB/AGE:  1931/04/15 / 78 y.o.  3 Days Post-Op Procedure(s) (LRB): REVERSE SHOULDER ARTHROPLASTY (Left)  Subjective: Pain is mild.  No c/o chest pain or SOB.   Ready for d/c.  No BM yet   Objective: Vital signs in last 24 hours: Temp:  [97.6 F (36.4 C)-98.3 F (36.8 C)] 98.3 F (36.8 C) (05/24 4098) Pulse Rate:  [58-78] 78  (05/24 0608) Resp:  [16-20] 18  (05/24 1191) BP: (88-115)/(50-67) 103/60 mmHg (05/24 0608) SpO2:  [96 %-99 %] 98 % (05/24 0608)  Intake/Output from previous day: 05/23 0701 - 05/24 0700 In: 720 [P.O.:720] Out: -  Intake/Output this shift:     Basename 09/09/11 0640  HGB 9.3*    Basename 09/09/11 0640  WBC 7.3  RBC 3.33*  HCT 28.5*  PLT 131*    Basename 09/09/11 0640  NA 134*  K 3.6  CL 99  CO2 25  BUN 15  CREATININE 0.96  GLUCOSE 132*  CALCIUM 8.1*   No results found for this basename: LABPT:2,INR:2 in the last 72 hours  Physical Exam: Neurovascular intact Incision: no drainage Compartment soft  Assessment/Plan: 3 Days Post-Op Procedure(s) (LRB): REVERSE SHOULDER ARTHROPLASTY (Left)   Discharge to SNF Senna/miralax F/u 10 days.  Mable Paris 09/11/2011, 7:28 AM

## 2011-09-11 NOTE — Care Management Note (Signed)
    Page 1 of 1   09/11/2011     3:35:32 PM   CARE MANAGEMENT NOTE 09/11/2011  Patient:  [name], [name]   Account Number:  [id number]  Date Initiated:  09/11/2011  Documentation initiated by:  Anette Guarneri  Subjective/Objective Assessment:   POD#3 s/p rotator cuff repair  lives alone, request SNF short term     Action/Plan:   CSW referral   Anticipated DC Date:  09/11/2011   Anticipated DC Plan:  SKILLED NURSING FACILITY  In-house referral  Clinical Social Worker      DC Planning Services  CM consult      Choice offered to / List presented to:             Status of service:  Completed, signed off Medicare Important Message given?   (If response is "NO", the following Medicare IM given date fields will be blank) Date Medicare IM given:   Date Additional Medicare IM given:    Discharge Disposition:  SKILLED NURSING FACILITY  Per UR Regulation:  Reviewed for med. necessity/level of care/duration of stay  If discussed at Long Length of Stay Meetings, dates discussed:    Comments:

## 2011-09-11 NOTE — Clinical Social Work Note (Signed)
Clinical Social Worker facilitated patient discharge including contacting patient family and facility.  Patient and facility have confirmed that patient is able to admit today to [facility].  Patient daughter has been notified and would like to transport patient by car.  RN to call report.  Clinical Social Worker will sign off for now as social work intervention is no longer needed. Please consult [place] again if new need arises.  [address] [place], [place] [phone]

## 2011-09-11 NOTE — Discharge Summary (Signed)
Patient ID: [name] MRN: [id number] DOB/AGE: 1931-03-06 78 y.o.  Admit date: 09/08/2011 Discharge date: 09/11/2011  Admission Diagnoses:  Principal Problem:  *Arthritis of shoulder region, left   Discharge Diagnoses:  Same  Past Medical History  Diagnosis Date  . S/P CABG x 1   . S/P inguinal hernia repair   . 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 CAD, NATIVE VESSEL 06/09/2008  . UNSPECIFIED ANEMIA   . DIABETES MELLITUS, TYPE II   . GERD   . HYPERLIPIDEMIA   . HYPERTENSION   . HIATAL HERNIA   . FIBROMYALGIA   . OA (osteoarthritis)     severe, shoulders, hands - inflammatory, ?RA  . Complication of anesthesia     Doesn't recognize family after and confusion after surgery.  . Gout   . Heart murmur   . Constipation   . Depression   . Insomnia     Surgeries: Procedure(s): REVERSE SHOULDER ARTHROPLASTY on 09/08/2011   Consultants:    Discharged Condition: Improved  Hospital Course: [name] is an 78 y.o. female who was admitted 09/08/2011 for operative treatment ofArthritis of shoulder region, left. Patient has severe unremitting pain that affects sleep, daily activities, and work/hobbies. After pre-op clearance the patient was taken to the operating room on 09/08/2011 and underwent  Procedure(s): REVERSE SHOULDER ARTHROPLASTY.    Patient was given perioperative antibiotics: Anti-infectives     Start     Dose/Rate Route Frequency Ordered Stop   09/08/11 1600   ceFAZolin (ANCEF) IVPB 1 g/50 mL premix        1 g 100 mL/hr over 30 Minutes Intravenous Every 6 hours 09/08/11 1323 09/09/11 0419           Patient was given sequential compression devices, early ambulation, and aspirin to prevent DVT.  Dressing d/c'd POD 3.  Wound c/d/i.  Patient benefited maximally from hospital stay and there were no complications.    Recent vital signs: Patient Vitals for the past 24 hrs:  BP Temp Pulse Resp SpO2  09/11/11 0608 103/60 mmHg 98.3 F (36.8 C) 78   18  98 %  09/10/11 2106 114/66 mmHg 98 F (36.7 C) 61  20  96 %  09/10/11 1443 88/50 mmHg 97.6 F (36.4 C) 58  16  99 %  09/10/11 1041 115/67 mmHg - 61  - -     Recent laboratory studies:  [facility] 09/09/11 0640  WBC 7.3  HGB 9.3*  HCT 28.5*  PLT 131*  NA 134*  K 3.6  CL 99  CO2 25  BUN 15  CREATININE 0.96  GLUCOSE 132*  INR --  CALCIUM 8.1*     Discharge Medications:   Medication List  As of 09/11/2011  7:33 AM   TAKE these medications         amLODipine 5 MG tablet   Commonly known as: NORVASC   Take 5 mg by mouth daily.      aspirin 325 MG EC tablet   Take 1 tablet (325 mg total) by mouth daily.      atorvastatin 40 MG tablet   Commonly known as: LIPITOR   Take 40 mg by mouth daily.      carvedilol 6.25 MG tablet   Commonly known as: COREG   Take 6.25 mg by mouth 2 (two) times daily with a meal.      cetirizine 10 MG tablet   Commonly known as: ZYRTEC   Take 10 mg by mouth daily.  ciclopirox 8 % solution   Commonly known as: PENLAC   Apply 1 application topically 3 times/day as needed-between meals & bedtime. For nail fungus/Apply over nail and surrounding skin. Apply daily over previous coat. After seven (7) days, may remove with alcohol and continue cycle.      dexlansoprazole 60 MG capsule   Commonly known as: DEXILANT   Take 60 mg by mouth daily.      iron polysaccharides 150 MG capsule   Commonly known as: NIFEREX   Take 150 mg by mouth 2 (two) times daily.      meloxicam 7.5 MG tablet   Commonly known as: MOBIC   Take 7.5 mg by mouth daily.      metFORMIN 500 MG (MOD) 24 hr tablet   Commonly known as: GLUMETZA   Take 500 mg by mouth 2 (two) times daily with a meal.      mulitivitamin with minerals Tabs   Take 1 tablet by mouth daily.      oxyCODONE-acetaminophen 5-325 MG per tablet   Commonly known as: PERCOCET   Take 1-2 tablets by mouth every 4 (four) hours as needed.      polyethylene glycol packet   Commonly known as:  MIRALAX / GLYCOLAX   Take 17 g by mouth daily.      ramipril 5 MG tablet   Commonly known as: ALTACE   Take 5 mg by mouth daily.      senna 8.6 MG Tabs   Commonly known as: SENOKOT   Take 1 tablet by mouth.      zolpidem 5 MG tablet   Commonly known as: AMBIEN   Take 5 mg by mouth at bedtime as needed. For sleep            Diagnostic Studies: Dg Shoulder Left Port  09/08/2011  *RADIOLOGY REPORT*  Clinical Data: Postop left shoulder replacement.  PORTABLE LEFT SHOULDER - 2+ VIEW  Comparison: CT 07/29/2011.  Findings: 1202 hours.  The patient is status post left shoulder reverse arthroplasty.  Hardware appears well positioned.  There is no evidence of acute fracture or dislocation in a single plane. Surgical drain is in place.  There is mild soft tissue emphysema lateral to the proximal humerus.  IMPRESSION: No demonstrated complication following left shoulder reverse arthroplasty.  Original Report Authenticated By: Gerrianne Scale, M.D.    Disposition: 03-Skilled Nursing Facility  Discharge Orders    Future Orders Please Complete By Expires   Diet - low sodium heart healthy      Call MD / Call 911      Comments:   If you experience chest pain or shortness of breath, CALL 911 and be transported to the hospital emergency room.  If you develope a fever above 101 F, pus (white drainage) or increased drainage or redness at the wound, or calf pain, call your surgeon's office.   Constipation Prevention      Comments:   Drink plenty of fluids.  Prune juice may be helpful.  You may use a stool softener, such as Colace (over the counter) 100 mg twice a day.  Use MiraLax (over the counter) for constipation as needed.   Increase activity slowly as tolerated      Driving restrictions      Comments:   No driving for 6 weeks      Follow-up Information    Follow up with Mable Paris, MD in 2 weeks.   Contact information:   [facility] [address] [place] [address] [phone]          Hand/wrist/ elbow motion only, no shoulder ROM yet.  Otherwise in sling at all times.  Dry dressing only as needed.   Signed: Mable Paris 09/11/2011, 7:33 AM

## 2011-09-11 NOTE — Progress Notes (Signed)
Report called to [facility] Norlene Duel, RN

## 2011-09-11 NOTE — Progress Notes (Signed)
Occupational Therapy Treatment Patient Details Name: [name] MRN: [id number] DOB: [birth year] Today's Date: 09/11/2011 Time: 0850-0909 OT Time Calculation (min): 19 min  OT Assessment / Plan / Recommendation Comments on Treatment Session Pt. progressing very well and anticipates D/C to ST-SNF today    Follow Up Recommendations  Skilled nursing facility       Equipment Recommendations  Defer to next venue       Frequency Min 2X/week   Plan Discharge plan remains appropriate    Precautions / Restrictions Precautions Precautions: Fall Restrictions Weight Bearing Restrictions: Yes   Pertinent Vitals/Pain 7/10 in shoulder    ADL  Upper Body Dressing: Performed;Minimal assistance (donning sling) Where Assessed - Upper Body Dressing: Unsupported sitting Toilet Transfer: Simulated;Min guard Toilet Transfer Method: Sit to Barista: Other (comment) (recliner) Equipment Used: Gait belt Transfers/Ambulation Related to ADLs: Min guard A with ambulation greater than 16ft ADL Comments: Pt. educated on sling don/doff and techniques for completing UB ADLs      OT Goals Acute Rehab OT Goals OT Goal Formulation: With patient Time For Goal Achievement: 09/23/11 Potential to Achieve Goals: Good ADL Goals Pt Will Transfer to Toilet: Independently;Ambulation;Regular height toilet ADL Goal: Toilet Transfer - Progress: Progressing toward goals Pt Will Perform Toileting - Hygiene: with modified independence;Sitting on 3-in-1 or toilet ADL Goal: Toileting - Hygiene - Progress: Progressing toward goals Additional ADL Goal #1: Pt will I'ly verbalize/demonstrate UB ADL adaptations. ADL Goal: Additional Goal #1 - Progress: Progressing toward goals Additional ADL Goal #2: Pt will I'ly verbalize/demonstrate LUE sling management. ADL Goal: Additional Goal #2 - Progress: Progressing toward goals Arm Goals Pt Will Perform AROM: Independently;3 sets;10 reps;Left upper  extremity Arm Goal: AROM - Progress: Progressing toward goal  Visit Information  Last OT Received On: 09/11/11 Assistance Needed: +1          Cognition  Overall Cognitive Status: Appears within functional limits for tasks assessed/performed Arousal/Alertness: Awake/alert Orientation Level: Oriented X4 / Intact Behavior During Session: [facility] for tasks performed    Mobility Bed Mobility Bed Mobility: Not assessed Transfers Transfers: Sit to Stand;Stand to Sit Sit to Stand: 4: Min guard;From chair/3-in-1;With upper extremity assist Stand to Sit: 4: Min guard Details for Transfer Assistance: Mod verbal cues for not using left UE during transfers.   Exercises Shoulder Exercises Elbow Flexion: AAROM;10 reps;Left;Seated Elbow Extension: AAROM;Left;10 reps;Seated Wrist Flexion: AROM;10 reps;Left;Seated Wrist Extension: AROM;Left;10 reps;Seated Digit Composite Flexion: AROM;10 reps;Left;Seated Composite Extension: AROM;Left;10 reps;Seated     End of Session OT - End of Session Equipment Utilized During Treatment: Gait belt Activity Tolerance: Patient tolerated treatment well Patient left: in chair;with call bell/phone within reach Nurse Communication: Mobility status   Cassandria Anger, OTR/L Pager [phone] 09/11/2011, 9:39 AM

## 2011-09-11 NOTE — Discharge Instructions (Signed)
Discharge Instructions after Reverse Total Shoulder Arthroplasty ° ° °A sling has been provided for you. You are to where this at all times, even while sleeping, until your first post operative visit with Dr. Osinachi Navarrette. °Use ice on the shoulder intermittently over the first 48 hours after surgery.  °Pain medicine has been prescribed for you.  °Use your medicine liberally over the first 48 hours, and then you can begin to taper your use. You may take Extra Strength Tylenol or Tylenol only in place of the pain pills. DO NOT take ANY nonsteroidal anti-inflammatory pain medications: Advil, Motrin, Ibuprofen, Aleve, Naproxen or Naprosyn.  °Take one aspirin a day for 2 weeks after surgery, unless you have an aspirin sensitivity/allergy or asthma.  °You may remove your dressing after two days  °You may shower 5 days after surgery. The incisions CANNOT get wet prior to 5 days. Simply allow the water to wash over the site and then pat dry. Do not rub the incisions. Make sure your axilla (armpit) is completely dry after showering. ° ° ° °Please call [phone] during normal business hours or [phone] after hours for any problems. Including the following: ° °- excessive redness of the incisions °- drainage for more than 4 days °- fever of more than 101.5 F ° °*Please note that pain medications will not be refilled after hours or on weekends. ° ° ° ° °

## 2011-09-16 DIAGNOSIS — I2510 Atherosclerotic heart disease of native coronary artery without angina pectoris: Secondary | ICD-10-CM | POA: Diagnosis not present

## 2011-09-16 DIAGNOSIS — E119 Type 2 diabetes mellitus without complications: Secondary | ICD-10-CM | POA: Diagnosis not present

## 2011-09-16 DIAGNOSIS — M19019 Primary osteoarthritis, unspecified shoulder: Secondary | ICD-10-CM | POA: Diagnosis not present

## 2011-09-16 DIAGNOSIS — I10 Essential (primary) hypertension: Secondary | ICD-10-CM | POA: Diagnosis not present

## 2011-09-16 DIAGNOSIS — E785 Hyperlipidemia, unspecified: Secondary | ICD-10-CM | POA: Diagnosis not present

## 2011-09-19 DIAGNOSIS — Z951 Presence of aortocoronary bypass graft: Secondary | ICD-10-CM | POA: Diagnosis not present

## 2011-09-19 DIAGNOSIS — I10 Essential (primary) hypertension: Secondary | ICD-10-CM | POA: Diagnosis not present

## 2011-09-19 DIAGNOSIS — D649 Anemia, unspecified: Secondary | ICD-10-CM | POA: Diagnosis not present

## 2011-09-19 DIAGNOSIS — Z96619 Presence of unspecified artificial shoulder joint: Secondary | ICD-10-CM | POA: Diagnosis not present

## 2011-09-19 DIAGNOSIS — I2510 Atherosclerotic heart disease of native coronary artery without angina pectoris: Secondary | ICD-10-CM | POA: Diagnosis not present

## 2011-09-19 DIAGNOSIS — IMO0001 Reserved for inherently not codable concepts without codable children: Secondary | ICD-10-CM | POA: Diagnosis not present

## 2011-09-19 DIAGNOSIS — Z5189 Encounter for other specified aftercare: Secondary | ICD-10-CM | POA: Diagnosis not present

## 2011-09-19 DIAGNOSIS — E119 Type 2 diabetes mellitus without complications: Secondary | ICD-10-CM | POA: Diagnosis not present

## 2011-09-19 DIAGNOSIS — M19019 Primary osteoarthritis, unspecified shoulder: Secondary | ICD-10-CM | POA: Diagnosis not present

## 2011-09-23 DIAGNOSIS — M19019 Primary osteoarthritis, unspecified shoulder: Secondary | ICD-10-CM | POA: Diagnosis not present

## 2011-09-29 DIAGNOSIS — Z96619 Presence of unspecified artificial shoulder joint: Secondary | ICD-10-CM | POA: Diagnosis not present

## 2011-09-29 DIAGNOSIS — Z471 Aftercare following joint replacement surgery: Secondary | ICD-10-CM | POA: Diagnosis not present

## 2011-09-29 DIAGNOSIS — M6281 Muscle weakness (generalized): Secondary | ICD-10-CM | POA: Diagnosis not present

## 2011-09-29 DIAGNOSIS — E119 Type 2 diabetes mellitus without complications: Secondary | ICD-10-CM | POA: Diagnosis not present

## 2011-09-29 DIAGNOSIS — I10 Essential (primary) hypertension: Secondary | ICD-10-CM | POA: Diagnosis not present

## 2011-09-29 DIAGNOSIS — Z5189 Encounter for other specified aftercare: Secondary | ICD-10-CM | POA: Diagnosis not present

## 2011-10-02 DIAGNOSIS — I10 Essential (primary) hypertension: Secondary | ICD-10-CM | POA: Diagnosis not present

## 2011-10-02 DIAGNOSIS — M6281 Muscle weakness (generalized): Secondary | ICD-10-CM | POA: Diagnosis not present

## 2011-10-02 DIAGNOSIS — Z471 Aftercare following joint replacement surgery: Secondary | ICD-10-CM | POA: Diagnosis not present

## 2011-10-02 DIAGNOSIS — E119 Type 2 diabetes mellitus without complications: Secondary | ICD-10-CM | POA: Diagnosis not present

## 2011-10-02 DIAGNOSIS — Z96619 Presence of unspecified artificial shoulder joint: Secondary | ICD-10-CM | POA: Diagnosis not present

## 2011-10-02 DIAGNOSIS — Z5189 Encounter for other specified aftercare: Secondary | ICD-10-CM | POA: Diagnosis not present

## 2011-10-06 DIAGNOSIS — I10 Essential (primary) hypertension: Secondary | ICD-10-CM | POA: Diagnosis not present

## 2011-10-06 DIAGNOSIS — M6281 Muscle weakness (generalized): Secondary | ICD-10-CM | POA: Diagnosis not present

## 2011-10-06 DIAGNOSIS — Z96619 Presence of unspecified artificial shoulder joint: Secondary | ICD-10-CM | POA: Diagnosis not present

## 2011-10-06 DIAGNOSIS — Z471 Aftercare following joint replacement surgery: Secondary | ICD-10-CM | POA: Diagnosis not present

## 2011-10-06 DIAGNOSIS — Z5189 Encounter for other specified aftercare: Secondary | ICD-10-CM | POA: Diagnosis not present

## 2011-10-06 DIAGNOSIS — E119 Type 2 diabetes mellitus without complications: Secondary | ICD-10-CM | POA: Diagnosis not present

## 2011-10-08 DIAGNOSIS — Z5189 Encounter for other specified aftercare: Secondary | ICD-10-CM | POA: Diagnosis not present

## 2011-10-08 DIAGNOSIS — Z96619 Presence of unspecified artificial shoulder joint: Secondary | ICD-10-CM | POA: Diagnosis not present

## 2011-10-08 DIAGNOSIS — Z471 Aftercare following joint replacement surgery: Secondary | ICD-10-CM | POA: Diagnosis not present

## 2011-10-08 DIAGNOSIS — M6281 Muscle weakness (generalized): Secondary | ICD-10-CM | POA: Diagnosis not present

## 2011-10-08 DIAGNOSIS — I10 Essential (primary) hypertension: Secondary | ICD-10-CM | POA: Diagnosis not present

## 2011-10-08 DIAGNOSIS — E119 Type 2 diabetes mellitus without complications: Secondary | ICD-10-CM | POA: Diagnosis not present

## 2011-10-09 DIAGNOSIS — I10 Essential (primary) hypertension: Secondary | ICD-10-CM | POA: Diagnosis not present

## 2011-10-09 DIAGNOSIS — Z471 Aftercare following joint replacement surgery: Secondary | ICD-10-CM | POA: Diagnosis not present

## 2011-10-09 DIAGNOSIS — E119 Type 2 diabetes mellitus without complications: Secondary | ICD-10-CM | POA: Diagnosis not present

## 2011-10-09 DIAGNOSIS — Z5189 Encounter for other specified aftercare: Secondary | ICD-10-CM | POA: Diagnosis not present

## 2011-10-09 DIAGNOSIS — Z96619 Presence of unspecified artificial shoulder joint: Secondary | ICD-10-CM | POA: Diagnosis not present

## 2011-10-09 DIAGNOSIS — M6281 Muscle weakness (generalized): Secondary | ICD-10-CM | POA: Diagnosis not present

## 2011-10-12 ENCOUNTER — Ambulatory Visit (INDEPENDENT_AMBULATORY_CARE_PROVIDER_SITE_OTHER): Payer: Medicare Other | Admitting: Internal Medicine

## 2011-10-12 ENCOUNTER — Encounter: Payer: Self-pay | Admitting: Internal Medicine

## 2011-10-12 VITALS — BP 152/70 | HR 79 | Temp 98.4°F | Ht 60.0 in | Wt 153.8 lb

## 2011-10-12 DIAGNOSIS — Z471 Aftercare following joint replacement surgery: Secondary | ICD-10-CM | POA: Diagnosis not present

## 2011-10-12 DIAGNOSIS — Z96619 Presence of unspecified artificial shoulder joint: Secondary | ICD-10-CM | POA: Diagnosis not present

## 2011-10-12 DIAGNOSIS — M25579 Pain in unspecified ankle and joints of unspecified foot: Secondary | ICD-10-CM | POA: Diagnosis not present

## 2011-10-12 DIAGNOSIS — Z5189 Encounter for other specified aftercare: Secondary | ICD-10-CM | POA: Diagnosis not present

## 2011-10-12 DIAGNOSIS — M6281 Muscle weakness (generalized): Secondary | ICD-10-CM | POA: Diagnosis not present

## 2011-10-12 DIAGNOSIS — M25571 Pain in right ankle and joints of right foot: Secondary | ICD-10-CM

## 2011-10-12 DIAGNOSIS — I10 Essential (primary) hypertension: Secondary | ICD-10-CM | POA: Diagnosis not present

## 2011-10-12 DIAGNOSIS — M25519 Pain in unspecified shoulder: Secondary | ICD-10-CM | POA: Diagnosis not present

## 2011-10-12 DIAGNOSIS — E119 Type 2 diabetes mellitus without complications: Secondary | ICD-10-CM | POA: Diagnosis not present

## 2011-10-12 MED ORDER — ASPIRIN EC 81 MG PO TBEC: 81.0000 mg | DELAYED_RELEASE_TABLET | Freq: Every day | ORAL | Status: DC

## 2011-10-12 NOTE — Progress Notes (Signed)
Subjective:    Patient ID: [name], female    DOB: October 05, 1930, 78 y.o.   MRN: [id number]  HPI  here for arthritis pain flare - Located R ankle - Onset 5 days ago Precipitated by being off meloxicam x4 weeks since L total shoulder replacement (on ASA so ortho stopped other NSAIDs)  reviewed chronic medical issues:  anemia - ABL status post  R total shoulder replacement 10/2010 and L TSR 08/2011 no chest pain, shortness of breath or edema -  No melena or BRBPR  HTN - reports compliance with ongoing medical treatment and no changes in medication dose or frequency. denies adverse side effects related to current therapy.   dyslipidemia - on lipitor - reports compliance with ongoing medical treatment and no changes in medication dose or frequency. denies adverse side effects related to current therapy.   DM2 - reports compliance with ongoing medical treatment and no changes in medication dose or frequency. denies adverse side effects related to current therapy. no hypoglycemia or hyperglycemia  diffuse, severe arthritis both inflammatory and DJD, dx never clarified in past but suspected RA - follows with rheum and ortho - participating in water areobics and PT - tolerable but constant pain with mild swelling in hands, wrists, knees and neck - also pain in both shoulders, but R improved s/p total joint 10/2010 replacment as rec by ortho. And L total shoulder replacement 08/2011- uses hydrocodone sparingly  Past Medical History  Diagnosis Date  . S/P CABG x 1   . S/P inguinal hernia repair   . 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 CAD, NATIVE VESSEL 06/09/2008  . UNSPECIFIED ANEMIA   . DIABETES MELLITUS, TYPE II   . GERD   . HYPERLIPIDEMIA   . HYPERTENSION   . HIATAL HERNIA   . FIBROMYALGIA   . OA (osteoarthritis)     severe, shoulders, hands - inflammatory, ?RA  . Complication of anesthesia     Doesn't recognize family after and confusion after surgery.  . Gout   . Heart  murmur   . Constipation   . Depression   . Insomnia     Review of Systems  Constitutional: Positive for fatigue. Negative for fever and unexpected weight change.  Respiratory: Negative for cough and shortness of breath.   Cardiovascular: Negative for chest pain.  Gastrointestinal: Negative for constipation and blood in stool.  Genitourinary: Negative for dysuria.  Neurological: Negative for weakness.  Psychiatric/Behavioral: Negative for dysphoric mood.      Objective:   Physical Exam   BP 152/70  Pulse 79  Temp 98.4 F (36.9 C) (Oral)  Ht 5' (1.524 m)  Wt 153 lb 12.8 oz (69.763 kg)  BMI 30.04 kg/m2  SpO2 98% Constitutional: She is overweight; appears well-developed and well-nourished. No distress.  Cardiovascular: Normal rate, regular rhythm and normal heart sounds.  No murmur heard. No BLE edema. Pulmonary/Chest: Effort normal and breath sounds normal. No respiratory distress. She has no wheezes. Abd: SNTND, +BS Musculoskeletal: Limited shoulder range of motion L, much improved on R; No gross deformities - no joint effusions in R nkle with FROM but tender at insertion Achilles  Neurological: She is alert and oriented to person, place, and time. No cranial nerve deficit. Coordination normal.  Skin: Skin is warm and dry. No rash noted. No erythema.  Psychiatric: She has a normal mood and affect. Her behavior is normal. Judgment and thought content normal.  Lab Results  Component Value Date   WBC 7.3 09/09/2011  HGB 9.3* 09/09/2011   HCT 28.5* 09/09/2011   PLT 131* 09/09/2011   CHOL 180 07/03/2011   TRIG 55.0 07/03/2011   HDL 108.30 07/03/2011   LDLDIRECT 115.3 09/30/2009   ALT 21 09/30/2009   AST 24 06/04/2010   NA 134* 09/09/2011   K 3.6 09/09/2011   CL 99 09/09/2011   CREATININE 0.96 09/09/2011   BUN 15 09/09/2011   CO2 25 09/09/2011   TSH 0.88 07/23/2010   INR 0.99 11/06/2010   HGBA1C 6.2 07/03/2011   MICROALBUR 1.1 09/30/2009    Lab Results  Component Value Date    VITAMINB12 869 07/23/2010   Lab Results  Component Value Date   IRON 23* 02/10/2011    Assessment & Plan:   see problem list. Medications and labs reviewed today.  S/p R total shoulder 08/2011 - Flare of inflammatory polyarthropathy (suspect RA)  Discussed again GI and bleeding risk of combining NSAIDs - pt understands risks and agrees Feel risk<benefit given disabling joint pain flares when off meloxicam -  will stop ASA as soon as ortho feels approp (presumeably for DVT prophylaxis)

## 2011-10-12 NOTE — Patient Instructions (Addendum)
It was good to see you today. I am glad your 2nd shoulder surgery went so well! Ok to take meloxicam in addition to ongoing baby aspirin - there is a increased risk of stomach upset and bleeding on this combination, but we feel the risk is worth the benefit given your arthritis pain symptoms when not taking the meloxicam Please stop the aspirin as soon as Dr Ave Filter feels it is ok to do so Please keep scheduled followup as planned (3-4 months), call sooner if problems.

## 2011-10-13 DIAGNOSIS — I10 Essential (primary) hypertension: Secondary | ICD-10-CM | POA: Diagnosis not present

## 2011-10-13 DIAGNOSIS — E119 Type 2 diabetes mellitus without complications: Secondary | ICD-10-CM | POA: Diagnosis not present

## 2011-10-13 DIAGNOSIS — Z96619 Presence of unspecified artificial shoulder joint: Secondary | ICD-10-CM | POA: Diagnosis not present

## 2011-10-13 DIAGNOSIS — M6281 Muscle weakness (generalized): Secondary | ICD-10-CM | POA: Diagnosis not present

## 2011-10-13 DIAGNOSIS — Z5189 Encounter for other specified aftercare: Secondary | ICD-10-CM | POA: Diagnosis not present

## 2011-10-13 DIAGNOSIS — Z471 Aftercare following joint replacement surgery: Secondary | ICD-10-CM | POA: Diagnosis not present

## 2011-10-15 DIAGNOSIS — Z5189 Encounter for other specified aftercare: Secondary | ICD-10-CM | POA: Diagnosis not present

## 2011-10-15 DIAGNOSIS — M6281 Muscle weakness (generalized): Secondary | ICD-10-CM | POA: Diagnosis not present

## 2011-10-15 DIAGNOSIS — I10 Essential (primary) hypertension: Secondary | ICD-10-CM | POA: Diagnosis not present

## 2011-10-15 DIAGNOSIS — E119 Type 2 diabetes mellitus without complications: Secondary | ICD-10-CM | POA: Diagnosis not present

## 2011-10-15 DIAGNOSIS — Z471 Aftercare following joint replacement surgery: Secondary | ICD-10-CM | POA: Diagnosis not present

## 2011-10-15 DIAGNOSIS — Z96619 Presence of unspecified artificial shoulder joint: Secondary | ICD-10-CM | POA: Diagnosis not present

## 2011-10-28 DIAGNOSIS — Z96619 Presence of unspecified artificial shoulder joint: Secondary | ICD-10-CM | POA: Diagnosis not present

## 2011-10-28 DIAGNOSIS — M25519 Pain in unspecified shoulder: Secondary | ICD-10-CM | POA: Diagnosis not present

## 2011-11-12 ENCOUNTER — Other Ambulatory Visit: Payer: Self-pay | Admitting: *Deleted

## 2011-11-12 MED ORDER — MELOXICAM 7.5 MG PO TABS: 7.5000 mg | ORAL_TABLET | Freq: Every day | ORAL | Status: DC

## 2011-11-17 DIAGNOSIS — L723 Sebaceous cyst: Secondary | ICD-10-CM | POA: Diagnosis not present

## 2011-11-18 ENCOUNTER — Other Ambulatory Visit: Payer: Self-pay | Admitting: Internal Medicine

## 2011-12-01 DIAGNOSIS — M19019 Primary osteoarthritis, unspecified shoulder: Secondary | ICD-10-CM | POA: Diagnosis not present

## 2011-12-02 DIAGNOSIS — M19019 Primary osteoarthritis, unspecified shoulder: Secondary | ICD-10-CM | POA: Diagnosis not present

## 2011-12-26 ENCOUNTER — Other Ambulatory Visit: Payer: Self-pay | Admitting: Internal Medicine

## 2012-01-04 ENCOUNTER — Ambulatory Visit (INDEPENDENT_AMBULATORY_CARE_PROVIDER_SITE_OTHER): Payer: Medicare Other | Admitting: Internal Medicine

## 2012-01-04 ENCOUNTER — Encounter: Payer: Self-pay | Admitting: Internal Medicine

## 2012-01-04 ENCOUNTER — Other Ambulatory Visit (INDEPENDENT_AMBULATORY_CARE_PROVIDER_SITE_OTHER): Payer: Medicare Other

## 2012-01-04 VITALS — BP 140/72 | HR 64 | Temp 97.9°F | Resp 16 | Wt 157.0 lb

## 2012-01-04 DIAGNOSIS — D649 Anemia, unspecified: Secondary | ICD-10-CM

## 2012-01-04 DIAGNOSIS — E119 Type 2 diabetes mellitus without complications: Secondary | ICD-10-CM

## 2012-01-04 DIAGNOSIS — R42 Dizziness and giddiness: Secondary | ICD-10-CM

## 2012-01-04 DIAGNOSIS — I10 Essential (primary) hypertension: Secondary | ICD-10-CM | POA: Diagnosis not present

## 2012-01-04 LAB — CBC WITH DIFFERENTIAL/PLATELET
Basophils Absolute: 0.1 10*3/uL (ref 0.0–0.1)
Basophils Relative: 1 % (ref 0.0–3.0)
Eosinophils Absolute: 0.2 10*3/uL (ref 0.0–0.7)
Eosinophils Relative: 3.1 % (ref 0.0–5.0)
HCT: 35.3 % — ABNORMAL LOW (ref 36.0–46.0)
Hemoglobin: 11.2 g/dL — ABNORMAL LOW (ref 12.0–15.0)
Lymphocytes Relative: 35.4 % (ref 12.0–46.0)
Lymphs Abs: 2.1 10*3/uL (ref 0.7–4.0)
MCHC: 31.7 g/dL (ref 30.0–36.0)
MCV: 87.2 fl (ref 78.0–100.0)
Monocytes Absolute: 0.5 10*3/uL (ref 0.1–1.0)
Monocytes Relative: 8.4 % (ref 3.0–12.0)
Neutro Abs: 3.1 10*3/uL (ref 1.4–7.7)
Neutrophils Relative %: 52.1 % (ref 43.0–77.0)
Platelets: 219 10*3/uL (ref 150.0–400.0)
RBC: 4.05 Mil/uL (ref 3.87–5.11)
RDW: 16.5 % — ABNORMAL HIGH (ref 11.5–14.6)
WBC: 5.9 10*3/uL (ref 4.5–10.5)

## 2012-01-04 MED ORDER — MECLIZINE HCL 25 MG PO TABS: 25.0000 mg | ORAL_TABLET | Freq: Three times a day (TID) | ORAL | Status: DC | PRN

## 2012-01-04 MED ORDER — METFORMIN HCL ER 500 MG PO TB24: 500.0000 mg | ORAL_TABLET | Freq: Every day | ORAL | Status: DC

## 2012-01-04 NOTE — Patient Instructions (Signed)
It was good to see you today. Test(s) ordered today. Your results will be called to you after review (48-72hours after test completion). If any changes need to be made, you will be notified at that time. Use meclizine for vertigo symptoms as needed - Your prescription(s) have been submitted to your pharmacy. Please take as directed and contact our office if you believe you are having problem(s) with the medication(s). Other medications reviewed and updated (changed Glumeta to generic metformin)- no other changes Please scheduled followup in 3-4 months for diabetes mellitus check, call sooner if problems. Vertigo Vertigo means you feel like you or your surroundings are moving when they are not. Vertigo can be dangerous if it occurs when you are at work, driving, or performing difficult activities.   CAUSES   Vertigo occurs when there is a conflict of signals sent to your brain from the visual and sensory systems in your body. There are many different causes of vertigo, including:  Infections, especially in the inner ear.   A bad reaction to a drug or misuse of alcohol and medicines.   Withdrawal from drugs or alcohol.   Rapidly changing positions, such as lying down or rolling over in bed.   A migraine headache.   Decreased blood flow to the brain.   Increased pressure in the brain from a head injury, infection, tumor, or bleeding.  SYMPTOMS   You may feel as though the world is spinning around or you are falling to the ground. Because your balance is upset, vertigo can cause nausea and vomiting. You may have involuntary eye movements (nystagmus). DIAGNOSIS   Vertigo is usually diagnosed by physical exam. If the cause of your vertigo is unknown, your caregiver may perform imaging tests, such as an MRI scan (magnetic resonance imaging). TREATMENT   Most cases of vertigo resolve on their own, without treatment. Depending on the cause, your caregiver may prescribe certain medicines. If your  vertigo is related to body position issues, your caregiver may recommend movements or procedures to correct the problem. In rare cases, if your vertigo is caused by certain inner ear problems, you may need surgery. HOME CARE INSTRUCTIONS    Follow your caregiver's instructions.   Avoid driving.   Avoid operating heavy machinery.   Avoid performing any tasks that would be dangerous to you or others during a vertigo episode.   Tell your caregiver if you notice that certain medicines seem to be causing your vertigo. Some of the medicines used to treat vertigo episodes can actually make them worse in some people.  SEEK IMMEDIATE MEDICAL CARE IF:    Your medicines do not relieve your vertigo or are making it worse.   You develop problems with talking, walking, weakness, or using your arms, hands, or legs.   You develop severe headaches.   Your nausea or vomiting continues or gets worse.   You develop visual changes.   A family member notices behavioral changes.   Your condition gets worse.  MAKE SURE YOU:  Understand these instructions.   Will watch your condition.   Will get help right away if you are not doing well or get worse.  Document Released: 01/14/2005 Document Revised: 03/26/2011 Document Reviewed: 10/23/2010 [facility] Patient Information 2012 [place], [place].

## 2012-01-04 NOTE — Progress Notes (Signed)
Subjective:    Patient ID: [name], female    DOB: [birth year], 78 y.o.   MRN: [id number]  HPI  here for dizzy feeling Onset >24h ago - No history of same Noted same when rising from bed to the bathroom last night Room spinning sensation, no weakness Symptoms gradually improved through the course of the day, then recurrent last evening Exacerbated by position change in beds Denies palpitations, headache, vision change Denies medication changes or recent trauma  Also reviewed chronic medical issues:  anemia - ABL status post  R total shoulder replacement 10/2010 and L TSR 08/2011 no chest pain, shortness of breath or edema -  No melena or BRBPR  hypertension - reports compliance with ongoing medical treatment and no changes in medication dose or frequency. denies adverse side effects related to current therapy.   dyslipidemia - on lipitor - reports compliance with ongoing medical treatment and no changes in medication dose or frequency. denies adverse side effects related to current therapy.   DM2 - reports compliance with ongoing medical treatment and no changes in medication dose or frequency. denies adverse side effects related to current therapy. no hypoglycemia or hyperglycemia  diffuse, severe arthritis both inflammatory and DJD, dx never clarified in past but suspected RA - follows with rheum and ortho - participating in water areobics and PT - tolerable but constant pain with mild swelling in hands, wrists, knees and neck - also pain in both shoulders, but R improved s/p total joint 10/2010 replacment as rec by ortho. And L total shoulder replacement 08/2011- uses hydrocodone sparingly  Past Medical History  Diagnosis Date  . S/P CABG x 1   . S/P inguinal hernia repair   . 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 CAD, NATIVE VESSEL 06/09/2008  . UNSPECIFIED ANEMIA   . DIABETES MELLITUS, TYPE II   . GERD   . HYPERLIPIDEMIA   . HYPERTENSION   . HIATAL HERNIA   .  FIBROMYALGIA   . OA (osteoarthritis)     severe, shoulders, hands - inflammatory, ?RA  . Gout   . Insomnia     Review of Systems  Constitutional: Positive for fatigue. Negative for fever and unexpected weight change.  Respiratory: Negative for cough and shortness of breath.   Cardiovascular: Negative for chest pain.  Gastrointestinal: Negative for constipation and blood in stool.  Genitourinary: Negative for dysuria.  Neurological: Negative for weakness.  Psychiatric/Behavioral: Negative for dysphoric mood.      Objective:   Physical Exam   BP 140/72  Pulse 64  Temp 97.9 F (36.6 C) (Oral)  Resp 16  Wt 157 lb (71.215 kg)  SpO2 97% Constitutional: She is overweight; appears well-developed and well-nourished. No distress.  HENT: Normocephalic atraumatic. Sinuses nontender to palpation. Tympanic membranes clear without effusion or erythema. Nose normal. Oropharynx clear and moist, no exudate Eyes: PERRL, no icterus or conjunctivitis. Vision grossly intact Cardiovascular: Normal rate, regular rhythm and normal heart sounds.  No murmur heard. No BLE edema. Pulmonary/Chest: Effort normal and breath sounds normal. No respiratory distress. She has no wheezes. Neurological: She is alert and oriented to person, place, and time. No cranial nerve deficit. Coordination normal.  Skin: Skin is warm and dry. No rash noted. No erythema.  Psychiatric: She has a normal mood and affect. Her behavior is normal. Judgment and thought content normal.  Lab Results  Component Value Date   WBC 7.3 09/09/2011   HGB 9.3* 09/09/2011   HCT 28.5* 09/09/2011   PLT  131* 09/09/2011   CHOL 180 07/03/2011   TRIG 55.0 07/03/2011   HDL 108.30 07/03/2011   LDLDIRECT 115.3 09/30/2009   ALT 21 09/30/2009   AST 24 06/04/2010   NA 134* 09/09/2011   K 3.6 09/09/2011   CL 99 09/09/2011   CREATININE 0.96 09/09/2011   BUN 15 09/09/2011   CO2 25 09/09/2011   TSH 0.88 07/23/2010   INR 0.99 11/06/2010   HGBA1C 6.2 07/03/2011    MICROALBUR 1.1 09/30/2009    Lab Results  Component Value Date   VITAMINB12 869 07/23/2010   Lab Results  Component Value Date   IRON 23* 02/10/2011    Assessment & Plan:  Dizzy - neuro and ENT exam benign. Suspect positional vertigo  Check screening labs rule out anemia for glycemic problems. Hemodynamic stable and no palpitations or other cardiac symptoms Trial meclizine when necessary -erx done  Also see problem list. Medications and labs reviewed today.

## 2012-01-04 NOTE — Assessment & Plan Note (Signed)
  Discussed again GI and bleeding risk of chronic NSAIDs - pt understands risks and agrees Feel risk<benefit given disabling joint pain flares when off meloxicam -  Continue iron

## 2012-01-04 NOTE — Assessment & Plan Note (Signed)
On metformin -The current medical regimen (metformin) is effective;  continue present plan and medications. Check a1c -adjust as needed Lab Results  Component Value Date   HGBA1C 6.2 07/03/2011   

## 2012-01-04 NOTE — Assessment & Plan Note (Signed)
Fair control - continue ongoing medical therapy as ongoing  BP Readings from Last 3 Encounters:  01/04/12 140/72  10/12/11 152/70  09/11/11 114/68

## 2012-01-11 ENCOUNTER — Ambulatory Visit: Payer: Medicare Other | Admitting: Internal Medicine

## 2012-01-12 ENCOUNTER — Other Ambulatory Visit: Payer: Self-pay | Admitting: Internal Medicine

## 2012-01-12 DIAGNOSIS — Z1231 Encounter for screening mammogram for malignant neoplasm of breast: Secondary | ICD-10-CM

## 2012-01-12 MED ORDER — METFORMIN HCL ER (OSM) 500 MG PO TB24
500.0000 mg | ORAL_TABLET | Freq: Two times a day (BID) | ORAL | Status: DC
Start: 2012-01-12 — End: 2012-01-12

## 2012-01-21 ENCOUNTER — Other Ambulatory Visit: Payer: Self-pay | Admitting: Internal Medicine

## 2012-01-28 ENCOUNTER — Ambulatory Visit (INDEPENDENT_AMBULATORY_CARE_PROVIDER_SITE_OTHER): Payer: Medicare Other | Admitting: Internal Medicine

## 2012-01-28 ENCOUNTER — Encounter: Payer: Self-pay | Admitting: Internal Medicine

## 2012-01-28 VITALS — BP 150/72 | HR 77 | Ht <= 58 in | Wt 159.0 lb

## 2012-01-28 DIAGNOSIS — I10 Essential (primary) hypertension: Secondary | ICD-10-CM

## 2012-01-28 DIAGNOSIS — I2510 Atherosclerotic heart disease of native coronary artery without angina pectoris: Secondary | ICD-10-CM

## 2012-01-28 DIAGNOSIS — I25810 Atherosclerosis of coronary artery bypass graft(s) without angina pectoris: Secondary | ICD-10-CM | POA: Diagnosis not present

## 2012-01-28 DIAGNOSIS — E785 Hyperlipidemia, unspecified: Secondary | ICD-10-CM

## 2012-01-28 NOTE — Progress Notes (Signed)
HPI Patient is a 78 year old with a history of CAD (s/p CABG in 2010), HTN, HL and fibromyalgia.  I saw her in Jan.  LDL in march was 61 Had shoulder surgery in May.  Since seen she done very well.  Denies CP.  Denies SOB Is doing water aerobics regularly  No Known Allergies  Current Outpatient Prescriptions  Medication Sig Dispense Refill  . amLODipine (NORVASC) 5 MG tablet Take 5 mg by mouth daily.      Marland Kitchen aspirin EC 81 MG tablet Take 1 tablet (81 mg total) by mouth daily.  150 tablet  2  . atorvastatin (LIPITOR) 40 MG tablet Take 40 mg by mouth daily.      . carvedilol (COREG) 6.25 MG tablet Take 6.25 mg by mouth 2 (two) times daily with a meal.      . cetirizine (ZYRTEC) 10 MG tablet TAKE 1 TABLET BY MOUTH DAILY  90 tablet  1  . ciclopirox (PENLAC) 8 % solution APPLY TOPICALLY AT BEDTIME. APPLY OVER NAIL AND SURROUNDING SKIN. APPLY DAILY OVER PREVIOUS COAT. AFTER 7 DAYS, MAY REMOVE WITH ALCOHOL AND   6.6 mL  2  . DEXILANT 60 MG capsule TAKE 1 CAPSULE BY MOUTH DAILY  90 capsule  1  . FERREX 150 150 MG capsule TAKE 1 CAPSULE (150 MG TOTAL) BY MOUTH 2 (TWO) TIMES DAILY.  60 capsule  5  . meclizine (ANTIVERT) 25 MG tablet Take 1 tablet (25 mg total) by mouth 3 (three) times daily as needed for dizziness.  30 tablet  0  . meloxicam (MOBIC) 7.5 MG tablet Take 1 tablet (7.5 mg total) by mouth daily.  30 tablet  3  . metFORMIN (GLUCOPHAGE XR) 500 MG 24 hr tablet Take 1 tablet (500 mg total) by mouth daily with breakfast.  90 tablet  3  . Multiple Vitamin (MULITIVITAMIN WITH MINERALS) TABS Take 1 tablet by mouth daily.      . polyethylene glycol (MIRALAX / GLYCOLAX) packet Take 17 g by mouth daily.      . ramipril (ALTACE) 5 MG tablet Take 5 mg by mouth daily.      Marland Kitchen senna (SENOKOT) 8.6 MG TABS Take 1 tablet by mouth.      . zolpidem (AMBIEN) 5 MG tablet Take 5 mg by mouth at bedtime as needed. For sleep      . DISCONTD: aspirin EC 325 MG EC tablet Take 1 tablet (325 mg total) by mouth daily.   30 tablet  0    Past Medical History  Diagnosis Date  . S/P CABG x 1   . S/P inguinal hernia repair   . 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 CAD, NATIVE VESSEL 06/09/2008  . UNSPECIFIED ANEMIA   . DIABETES MELLITUS, TYPE II   . GERD   . HYPERLIPIDEMIA   . HYPERTENSION   . HIATAL HERNIA   . FIBROMYALGIA   . OA (osteoarthritis)     severe, shoulders, hands - inflammatory, ?RA  . Gout   . Insomnia     Past Surgical History  Procedure Date  . Hernia repair   . Abdominal hysterectomy 1981  . Coronary artery bypass graft 2010  . Appendectomy   . Replacement total knee bilateral   . Rotator cuff repair     Left  . Ovarian cyst removal   . Cataract extraction, bilateral   . Joint replacement   . Reverse shoulder arthroplasty 09/08/2011    Procedure: REVERSE SHOULDER ARTHROPLASTY;  Surgeon:  Mable Paris, MD;  Location: [facility] OR;  Service: Orthopedics;  Laterality: Left;  left reverse total shoulder arthroplasty    Family History  Problem Relation Age of Onset  . Cancer Mother     Stomach  . Heart disease Father 93    MI  . Diabetes Sister   . Heart disease Sister   . Heart attack Brother   . Anesthesia problems Neg Hx     History   Social History  . Marital Status: Widowed    Spouse Name: N/A    Number of Children: N/A  . Years of Education: N/A   Occupational History  . Not on file.   Social History Main Topics  . Smoking status: Former Games developer  . Smokeless tobacco: Not on file   Comment: Widows, lives alone. 2 sons. 2 daughters. Retired Health visitor  . Alcohol Use: No  . Drug Use: No  . Sexually Active: Not on file   Other Topics Concern  . Not on file   Social History Narrative  . No narrative on file    Review of Systems:  All systems reviewed.  They are negative to the above problem except as previously stated.  Vital Signs:   Physical Exam  HEENT:  Normocephalic, atraumatic. EOMI, PERRLA.  Neck: JVP is normal.  No bruits.    Lungs: clear to auscultation. No rales no wheezes.  Heart: Regular rate and rhythm. Normal S1, S2. No S3.   No significant murmurs. PMI not displaced.  Abdomen:  Supple, nontender. Normal bowel sounds. No masses. No hepatomegaly.  Extremities:   Good distal pulses throughout. No lower extremity edema.  Musculoskeletal :moving all extremities.  Neuro:   alert and oriented x3.  CN II-XII grossly intact.  EKG  SR  77.   Assessment and Plan:  1.  CAD.  Doing well  No symptoms of angina.  2.  HTN  Adequate control    3.  Hyperlipidemia.  Continue statin.

## 2012-01-28 NOTE — Patient Instructions (Addendum)
Your physician recommends that you continue on your current medications as directed. Please refer to the Current Medication list given to you today. Your physician wants you to follow-up in: 10 months with Dr. Ross.  You will receive a reminder letter in the mail two months in advance. If you don't receive a letter, please call our office to schedule the follow-up appointment.  

## 2012-02-02 DIAGNOSIS — Z23 Encounter for immunization: Secondary | ICD-10-CM | POA: Diagnosis not present

## 2012-02-08 DIAGNOSIS — M545 Low back pain, unspecified: Secondary | ICD-10-CM | POA: Diagnosis not present

## 2012-02-08 DIAGNOSIS — M25559 Pain in unspecified hip: Secondary | ICD-10-CM | POA: Diagnosis not present

## 2012-02-08 DIAGNOSIS — M549 Dorsalgia, unspecified: Secondary | ICD-10-CM | POA: Diagnosis not present

## 2012-02-09 ENCOUNTER — Encounter: Payer: Self-pay | Admitting: Gastroenterology

## 2012-02-12 ENCOUNTER — Encounter: Payer: Self-pay | Admitting: Gastroenterology

## 2012-02-22 ENCOUNTER — Ambulatory Visit: Payer: Medicare Other

## 2012-02-23 ENCOUNTER — Ambulatory Visit
Admission: RE | Admit: 2012-02-23 | Discharge: 2012-02-23 | Disposition: A | Discharge status: Home / Self Care | Payer: Medicare Other | Source: Ambulatory Visit | Attending: Internal Medicine | Admitting: Internal Medicine

## 2012-02-23 DIAGNOSIS — Z1231 Encounter for screening mammogram for malignant neoplasm of breast: Secondary | ICD-10-CM

## 2012-03-03 ENCOUNTER — Other Ambulatory Visit: Payer: Self-pay | Admitting: *Deleted

## 2012-03-03 MED ORDER — DEXLANSOPRAZOLE 60 MG PO CPDR: 60.0000 mg | DELAYED_RELEASE_CAPSULE | Freq: Every day | ORAL | Status: DC

## 2012-03-09 DIAGNOSIS — M25559 Pain in unspecified hip: Secondary | ICD-10-CM | POA: Diagnosis not present

## 2012-03-11 ENCOUNTER — Encounter: Payer: Self-pay | Admitting: Gastroenterology

## 2012-03-21 ENCOUNTER — Other Ambulatory Visit: Payer: Self-pay | Admitting: *Deleted

## 2012-03-21 MED ORDER — AMLODIPINE BESYLATE 5 MG PO TABS: 5.0000 mg | ORAL_TABLET | Freq: Every day | ORAL | Status: DC

## 2012-03-21 MED ORDER — CARVEDILOL 6.25 MG PO TABS: 6.2500 mg | ORAL_TABLET | Freq: Two times a day (BID) | ORAL | Status: DC

## 2012-03-21 NOTE — Telephone Encounter (Signed)
R'cd fax from [facility] for refill of Carvedilol and Norvasc.

## 2012-03-22 ENCOUNTER — Other Ambulatory Visit: Payer: Self-pay

## 2012-03-22 ENCOUNTER — Other Ambulatory Visit: Payer: Self-pay | Admitting: Internal Medicine

## 2012-03-22 MED ORDER — RAMIPRIL 5 MG PO TABS: 5.0000 mg | ORAL_TABLET | Freq: Every day | ORAL | Status: DC

## 2012-03-24 ENCOUNTER — Encounter: Payer: Medicare Other | Admitting: Gastroenterology

## 2012-03-25 DIAGNOSIS — E119 Type 2 diabetes mellitus without complications: Secondary | ICD-10-CM | POA: Diagnosis not present

## 2012-03-25 LAB — HM DIABETES EYE EXAM

## 2012-03-28 ENCOUNTER — Encounter: Payer: Self-pay | Admitting: Internal Medicine

## 2012-04-04 ENCOUNTER — Other Ambulatory Visit: Payer: Self-pay | Admitting: Internal Medicine

## 2012-04-07 ENCOUNTER — Other Ambulatory Visit: Payer: Self-pay | Admitting: *Deleted

## 2012-04-07 MED ORDER — CICLOPIROX 8 % EX SOLN: CUTANEOUS | Status: DC

## 2012-04-26 ENCOUNTER — Ambulatory Visit: Payer: Medicare Other | Admitting: *Deleted

## 2012-04-26 ENCOUNTER — Other Ambulatory Visit: Payer: Self-pay | Admitting: *Deleted

## 2012-04-26 VITALS — Ht <= 58 in | Wt 163.8 lb

## 2012-04-26 DIAGNOSIS — Z1211 Encounter for screening for malignant neoplasm of colon: Secondary | ICD-10-CM

## 2012-04-26 MED ORDER — CETIRIZINE HCL 10 MG PO TABS: 10.0000 mg | ORAL_TABLET | Freq: Every day | ORAL | Status: DC

## 2012-04-26 MED ORDER — MOVIPREP 100 G PO SOLR: ORAL | Status: DC

## 2012-05-02 ENCOUNTER — Other Ambulatory Visit: Payer: Self-pay | Admitting: *Deleted

## 2012-05-02 DIAGNOSIS — M549 Dorsalgia, unspecified: Secondary | ICD-10-CM | POA: Diagnosis not present

## 2012-05-02 DIAGNOSIS — M5430 Sciatica, unspecified side: Secondary | ICD-10-CM | POA: Diagnosis not present

## 2012-05-02 DIAGNOSIS — M25579 Pain in unspecified ankle and joints of unspecified foot: Secondary | ICD-10-CM | POA: Diagnosis not present

## 2012-05-02 MED ORDER — CETIRIZINE HCL 10 MG PO TABS: 10.0000 mg | ORAL_TABLET | Freq: Every day | ORAL | Status: DC

## 2012-05-05 ENCOUNTER — Ambulatory Visit (AMBULATORY_SURGERY_CENTER): Payer: Medicare Other | Admitting: Gastroenterology

## 2012-05-05 ENCOUNTER — Encounter: Payer: Self-pay | Admitting: Gastroenterology

## 2012-05-05 ENCOUNTER — Ambulatory Visit: Payer: Medicare Other | Admitting: Internal Medicine

## 2012-05-05 VITALS — BP 154/77 | HR 61 | Temp 98.8°F | Resp 16 | Ht <= 58 in | Wt 163.0 lb

## 2012-05-05 DIAGNOSIS — Z860100 Personal history of colon polyps, unspecified: Secondary | ICD-10-CM

## 2012-05-05 DIAGNOSIS — I10 Essential (primary) hypertension: Secondary | ICD-10-CM | POA: Diagnosis not present

## 2012-05-05 DIAGNOSIS — E119 Type 2 diabetes mellitus without complications: Secondary | ICD-10-CM | POA: Diagnosis not present

## 2012-05-05 DIAGNOSIS — Z1211 Encounter for screening for malignant neoplasm of colon: Secondary | ICD-10-CM

## 2012-05-05 LAB — GLUCOSE, CAPILLARY
Glucose-Capillary: 129 mg/dL — ABNORMAL HIGH (ref 70–99)
Glucose-Capillary: 117 mg/dL — ABNORMAL HIGH (ref 70–99)

## 2012-05-05 MED ORDER — SODIUM CHLORIDE 0.9 % IV SOLN: 500.0000 mL | INTRAVENOUS | Status: DC

## 2012-05-05 NOTE — Progress Notes (Signed)
Patient did not experience any of the following events: a burn prior to discharge; a fall within the facility; wrong site/side/patient/procedure/implant event; or a hospital transfer or hospital admission upon discharge from the facility. (G8907) Patient did not have preoperative order for IV antibiotic SSI prophylaxis. (G8918)  

## 2012-05-05 NOTE — Patient Instructions (Addendum)
Impressions/recommendations:  Diverticulosis (handout given) Hemorrhoids (handout given)  High Fiber Diet (handout given) with liberal fluid intake.  No repeat colonoscopy for routine colon cancer screening.  YOU HAD AN ENDOSCOPIC PROCEDURE TODAY AT THE [facility]: Refer to the procedure report that was given to you for any specific questions about what was found during the examination.  If the procedure report does not answer your questions, please call your gastroenterologist to clarify.  If you requested that your care partner not be given the details of your procedure findings, then the procedure report has been included in a sealed envelope for you to review at your convenience later.  YOU SHOULD EXPECT: Some feelings of bloating in the abdomen. Passage of more gas than usual.  Walking can help get rid of the air that was put into your GI tract during the procedure and reduce the bloating. If you had a lower endoscopy (such as a colonoscopy or flexible sigmoidoscopy) you may notice spotting of blood in your stool or on the toilet paper. If you underwent a bowel prep for your procedure, then you may not have a normal bowel movement for a few days.  DIET: Your first meal following the procedure should be a light meal and then it is ok to progress to your normal diet.  A half-sandwich or bowl of soup is an example of a good first meal.  Heavy or fried foods are harder to digest and may make you feel nauseous or bloated.  Likewise meals heavy in dairy and vegetables can cause extra gas to form and this can also increase the bloating.  Drink plenty of fluids but you should avoid alcoholic beverages for 24 hours.  ACTIVITY: Your care partner should take you home directly after the procedure.  You should plan to take it easy, moving slowly for the rest of the day.  You can resume normal activity the day after the procedure however you should NOT DRIVE or use heavy machinery for 24 hours  (because of the sedation medicines used during the test).    SYMPTOMS TO REPORT IMMEDIATELY: A gastroenterologist can be reached at any hour.  During normal business hours, 8:30 AM to 5:00 PM Monday through Friday, call [phone].  After hours and on weekends, please call the GI answering service at [phone] who will take a message and have the physician on call contact you.   Following lower endoscopy (colonoscopy or flexible sigmoidoscopy):  Excessive amounts of blood in the stool  Significant tenderness or worsening of abdominal pains  Swelling of the abdomen that is new, acute  Fever of 100F or higher   FOLLOW UP: If any biopsies were taken you will be contacted by phone or by letter within the next 1-3 weeks.  Call your gastroenterologist if you have not heard about the biopsies in 3 weeks.  Our staff will call the home number listed on your records the next business day following your procedure to check on you and address any questions or concerns that you may have at that time regarding the information given to you following your procedure. This is a courtesy call and so if there is no answer at the home number and we have not heard from you through the emergency physician on call, we will assume that you have returned to your regular daily activities without incident.  SIGNATURES/CONFIDENTIALITY: You and/or your care partner have signed paperwork which will be entered into your electronic medical record.  These  signatures attest to the fact that that the information above on your After Visit Summary has been reviewed and is understood.  Full responsibility of the confidentiality of this discharge information lies with you and/or your care-partner.

## 2012-05-05 NOTE — Op Note (Signed)
[facility] [address] [place], [address]   COLONOSCOPY PROCEDURE REPORT  PATIENT: [name], [name]  MR#: [id number] BIRTHDATE: 01/14/1931 , 81  yrs. old GENDER: Female ENDOSCOPIST: Meryl Dare, MD, [facility] PROCEDURE DATE:  05/05/2012 PROCEDURE:   Colonoscopy, screening ASA CLASS:   Class II INDICATIONS:Patient's personal history of adenomatous colon polyps.  MEDICATIONS: MAC sedation, administered by CRNA and propofol (Diprivan) 170mg  IV DESCRIPTION OF PROCEDURE:   After the risks benefits and alternatives of the procedure were thoroughly explained, informed consent was obtained.  A digital rectal exam revealed no abnormalities of the rectum.   The LB [id number]-[id number]  endoscope was introduced through the anus and advanced to the cecum, which was identified by both the appendix and ileocecal valve. No adverse events experienced.   The quality of the prep was good, using MoviPrep  The instrument was then slowly withdrawn as the colon was fully examined.  COLON FINDINGS: Moderate diverticulosis was noted in the sigmoid colon.   The colon was otherwise normal.  There was no diverticulosis, inflammation, polyps or cancers unless previously stated.  Retroflexed views revealed small internal hemorrhoids. The time to cecum=3 minutes 12 seconds.  Withdrawal time=9 minutes 06 seconds.  The scope was withdrawn and the procedure completed. COMPLICATIONS: There were no complications.  ENDOSCOPIC IMPRESSION: 1.   Moderate diverticulosis was noted in the sigmoid colon 2.   Small internal hemorrhoids  RECOMMENDATIONS: 1.  High fiber diet with liberal fluid intake. 2.  Given your age, you will not need another colonoscopy for colon cancer screening or polyp surveillance.  These types of tests usually stop around the age 25.   eSigned:  Meryl Dare, MD, [facility] 05/05/2012 10:38 AM

## 2012-05-06 ENCOUNTER — Telehealth: Payer: Self-pay | Admitting: *Deleted

## 2012-05-06 NOTE — Telephone Encounter (Signed)
  Follow up Call-  Call back number 05/05/2012  Post procedure Call Back phone  # [phone]  Permission to leave phone message Yes     Patient questions:  Do you have a fever, pain , or abdominal swelling? no Pain Score  0 *  Have you tolerated food without any problems? yes  Have you been able to return to your normal activities? yes  Do you have any questions about your discharge instructions: Diet   no Medications  no Follow up visit  no  Do you have questions or concerns about your Care? no  Actions: * If pain score is 4 or above: No action needed, pain <4.

## 2012-05-31 ENCOUNTER — Other Ambulatory Visit: Payer: Self-pay | Admitting: Internal Medicine

## 2012-06-06 ENCOUNTER — Other Ambulatory Visit: Payer: Self-pay | Admitting: *Deleted

## 2012-06-06 MED ORDER — ATORVASTATIN CALCIUM 40 MG PO TABS: 40.0000 mg | ORAL_TABLET | Freq: Every day | ORAL | Status: DC

## 2012-06-06 MED ORDER — DEXLANSOPRAZOLE 60 MG PO CPDR: 60.0000 mg | DELAYED_RELEASE_CAPSULE | Freq: Every day | ORAL | Status: DC

## 2012-06-06 MED ORDER — CARVEDILOL 6.25 MG PO TABS: ORAL_TABLET | ORAL | Status: DC

## 2012-06-06 MED ORDER — AMLODIPINE BESYLATE 5 MG PO TABS: 5.0000 mg | ORAL_TABLET | Freq: Every day | ORAL | Status: DC

## 2012-06-06 MED ORDER — METFORMIN HCL ER 500 MG PO TB24: 500.0000 mg | ORAL_TABLET | Freq: Every day | ORAL | Status: DC

## 2012-06-06 MED ORDER — RAMIPRIL 5 MG PO TABS: 5.0000 mg | ORAL_TABLET | Freq: Every day | ORAL | Status: DC

## 2012-06-06 NOTE — Telephone Encounter (Signed)
Received fax from [facility] for refills on medicaltion. Called pt to verify. She states since insurance change no longer using [facility]. Will update records. Fax forms back to [employer]...Raechel Chute

## 2012-06-17 ENCOUNTER — Other Ambulatory Visit: Payer: Self-pay | Admitting: Internal Medicine

## 2012-07-04 ENCOUNTER — Other Ambulatory Visit: Payer: Self-pay | Admitting: *Deleted

## 2012-07-04 MED ORDER — POLYSACCHARIDE IRON COMPLEX 150 MG PO CAPS: ORAL_CAPSULE | ORAL | Status: DC

## 2012-07-04 MED ORDER — METFORMIN HCL ER 500 MG PO TB24: 500.0000 mg | ORAL_TABLET | Freq: Every day | ORAL | Status: DC

## 2012-07-06 ENCOUNTER — Other Ambulatory Visit (INDEPENDENT_AMBULATORY_CARE_PROVIDER_SITE_OTHER): Payer: Medicare Other

## 2012-07-06 ENCOUNTER — Encounter: Payer: Self-pay | Admitting: Internal Medicine

## 2012-07-06 ENCOUNTER — Ambulatory Visit (INDEPENDENT_AMBULATORY_CARE_PROVIDER_SITE_OTHER): Payer: Medicare Other | Admitting: Internal Medicine

## 2012-07-06 ENCOUNTER — Other Ambulatory Visit: Payer: Medicare Other

## 2012-07-06 VITALS — BP 132/72 | HR 63 | Temp 97.9°F | Wt 163.0 lb

## 2012-07-06 DIAGNOSIS — R002 Palpitations: Secondary | ICD-10-CM | POA: Diagnosis not present

## 2012-07-06 DIAGNOSIS — E119 Type 2 diabetes mellitus without complications: Secondary | ICD-10-CM | POA: Diagnosis not present

## 2012-07-06 DIAGNOSIS — R413 Other amnesia: Secondary | ICD-10-CM

## 2012-07-06 LAB — CBC WITH DIFFERENTIAL/PLATELET
Basophils Absolute: 0 10*3/uL (ref 0.0–0.1)
Basophils Relative: 0.5 % (ref 0.0–3.0)
Eosinophils Absolute: 0.3 10*3/uL (ref 0.0–0.7)
Eosinophils Relative: 5.1 % — ABNORMAL HIGH (ref 0.0–5.0)
HCT: 39.1 % (ref 36.0–46.0)
Hemoglobin: 12.9 g/dL (ref 12.0–15.0)
Lymphocytes Relative: 35.4 % (ref 12.0–46.0)
Lymphs Abs: 2.1 10*3/uL (ref 0.7–4.0)
MCHC: 32.9 g/dL (ref 30.0–36.0)
MCV: 88.2 fl (ref 78.0–100.0)
Monocytes Absolute: 0.6 10*3/uL (ref 0.1–1.0)
Monocytes Relative: 10.6 % (ref 3.0–12.0)
Neutro Abs: 2.9 10*3/uL (ref 1.4–7.7)
Neutrophils Relative %: 48.4 % (ref 43.0–77.0)
Platelets: 184 10*3/uL (ref 150.0–400.0)
RBC: 4.44 Mil/uL (ref 3.87–5.11)
RDW: 17.9 % — ABNORMAL HIGH (ref 11.5–14.6)
WBC: 5.9 10*3/uL (ref 4.5–10.5)

## 2012-07-06 LAB — HEMOGLOBIN A1C: Hgb A1c MFr Bld: 6 % (ref 4.6–6.5)

## 2012-07-06 LAB — HEPATIC FUNCTION PANEL
ALT: 20 U/L (ref 0–35)
AST: 27 U/L (ref 0–37)
Albumin: 3.9 g/dL (ref 3.5–5.2)
Alkaline Phosphatase: 64 U/L (ref 39–117)
Bilirubin, Direct: 0.1 mg/dL (ref 0.0–0.3)
Total Bilirubin: 0.7 mg/dL (ref 0.3–1.2)
Total Protein: 6.9 g/dL (ref 6.0–8.3)

## 2012-07-06 LAB — BASIC METABOLIC PANEL
BUN: 12 mg/dL (ref 6–23)
CO2: 26 mEq/L (ref 19–32)
Calcium: 9.2 mg/dL (ref 8.4–10.5)
Chloride: 105 mEq/L (ref 96–112)
Creatinine, Ser: 0.9 mg/dL (ref 0.4–1.2)
GFR: 77.46 mL/min (ref >60.00)
Glucose, Bld: 102 mg/dL — ABNORMAL HIGH (ref 70–99)
Potassium: 4 mEq/L (ref 3.5–5.1)
Sodium: 139 mEq/L (ref 135–145)

## 2012-07-06 LAB — TSH: TSH: 0.92 u[IU]/mL (ref 0.35–5.50)

## 2012-07-06 MED ORDER — ALPRAZOLAM 0.25 MG PO TABS: 0.2500 mg | ORAL_TABLET | Freq: Three times a day (TID) | ORAL | Status: DC | PRN

## 2012-07-06 NOTE — Addendum Note (Signed)
Addended by: Rene Paci A on: 07/06/2012 08:00 PM   Modules accepted: Orders

## 2012-07-06 NOTE — Progress Notes (Signed)
Subjective:    Patient ID: [name], female    DOB: [birth year], 79 y.o.   MRN: [id number]  HPI  See CC Concerned about palpitations   Also concerned about memory changes - slowly progressive over past few months  Past Medical History  Diagnosis Date  . S/P CABG x 1   . S/P inguinal hernia repair   . 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 CAD, NATIVE VESSEL 06/09/2008  . UNSPECIFIED ANEMIA   . DIABETES MELLITUS, TYPE II   . GERD   . HYPERLIPIDEMIA   . HYPERTENSION   . HIATAL HERNIA   . FIBROMYALGIA   . OA (osteoarthritis)     severe, shoulders, hands - inflammatory, ?RA  . Gout   . Insomnia     Review of Systems  Constitutional: Positive for fatigue. Negative for fever and unexpected weight change.  Respiratory: Negative for cough, shortness of breath and wheezing.   Cardiovascular: Positive for palpitations. Negative for chest pain and leg swelling.  Neurological: Negative for dizziness, tremors, facial asymmetry, weakness, light-headedness, numbness and headaches.  Psychiatric/Behavioral: Positive for sleep disturbance, dysphoric mood and decreased concentration. Negative for suicidal ideas, hallucinations, behavioral problems, confusion and self-injury. The patient is nervous/anxious. The patient is not hyperactive.        Objective:   Physical Exam BP 132/72  Pulse 63  Temp(Src) 97.9 F (36.6 C) (Oral)  Wt 163 lb (73.936 kg)  BMI 34.08 kg/m2  SpO2 95% Wt Readings from Last 3 Encounters:  07/06/12 163 lb (73.936 kg)  05/05/12 163 lb (73.936 kg)  04/26/12 163 lb 12.8 oz (74.299 kg)   Constitutional: She appears well-developed and well-nourished. No distress, but mildly emotional and dysphoric speaking of son's death 07-13-11.   Neck: Normal range of motion. Neck supple. No JVD present. No thyromegaly present.  Cardiovascular: Normal rate, regular rhythm and normal heart sounds.  No murmur heard. No BLE edema. Pulmonary/Chest: Effort normal and breath  sounds normal. No respiratory distress. She has no wheezes.  Psychiatric: She has a mildly depressed mood and occ tearful affect. Her behavior is normal. Judgment and thought content normal. MMSE 29/30   Lab Results  Component Value Date   WBC 5.9 01/04/2012   HGB 11.2* 01/04/2012   HCT 35.3* 01/04/2012   PLT 219.0 01/04/2012   GLUCOSE 132* 09/09/2011   CHOL 180 07/03/2011   TRIG 55.0 07/03/2011   HDL 108.30 07/03/2011   LDLDIRECT 115.3 09/30/2009   LDLCALC 61 07/03/2011   ALT 21 09/30/2009   AST 24 06/04/2010   NA 134* 09/09/2011   K 3.6 09/09/2011   CL 99 09/09/2011   CREATININE 0.96 09/09/2011   BUN 15 09/09/2011   CO2 25 09/09/2011   TSH 0.88 07/23/2010   INR 0.99 11/06/2010   HGBA1C 6.2 07/03/2011   MICROALBUR 1.1 09/30/2009      Assessment & Plan:   Memory loss - overlap on hx with mild depression and anxiety symptoms, anniversary of son's death acknowledged - check screening labs - reassurance provided as MMSE 29/30 today- consider starting low dose SSRI if no other lab abn identified  palpitations - suspect anxiety related as symptoms are situational in last 2 weeks related to emotional stress of son's death 1 year ago - no medication changes or stimulants - no chest pain or shortness of breath - check screening labs as above and consider holter if persisting without other lab abn idetified - advised ok to use xanax prn as rx'd last year  for same symptoms at time of his death

## 2012-07-06 NOTE — Assessment & Plan Note (Signed)
On metformin -The current medical regimen (metformin) is effective;  continue present plan and medications. Check a1c -adjust as needed Lab Results  Component Value Date   HGBA1C 6.2 07/03/2011

## 2012-07-06 NOTE — Assessment & Plan Note (Signed)
Exam remains benign, symptoms improved - suspect mild depression (see above) 07/2010 results of tsh and b12 and other labs + mri brain reviewed - no abn Recheck labs now

## 2012-07-06 NOTE — Patient Instructions (Signed)
It was good to see you today. We have reviewed your prior records including labs and tests today Continue to use xanax as needed for anxiety/nerves and Ambien as need for sleep - Your prescription(s) have been submitted to your pharmacy. Please take as directed and contact our office if you believe you are having problem(s) with the medication(s). Test(s) ordered today. Your results will be released to MyChart (or called to you) after review, usually within 72hours after test completion. If any changes need to be made, you will be notified at that same time. Please schedule followup in 4-6 weeks to review symptoms and medications, call sooner if problems. Palpitations  A palpitation is the feeling that your heartbeat is irregular or is faster than normal. It may feel like your heart is fluttering or skipping a beat. Palpitations are usually not a serious problem. However, in some cases, you may need further medical evaluation. CAUSES  Palpitations can be caused by:  Smoking.  Caffeine or other stimulants, such as diet pills or energy drinks.  Alcohol.  Stress and anxiety.  Strenuous physical activity.  Fatigue.  Certain medicines.  Heart disease, especially if you have a history of arrhythmias. This includes atrial fibrillation, atrial flutter, or supraventricular tachycardia.  An improperly working pacemaker or defibrillator. DIAGNOSIS  To find the cause of your palpitations, your caregiver will take your history and perform a physical exam. Tests may also be done, including:  Electrocardiography (ECG). This test records the heart's electrical activity.  Cardiac monitoring. This allows your caregiver to monitor your heart rate and rhythm in real time.  Holter monitor. This is a portable device that records your heartbeat and can help diagnose heart arrhythmias. It allows your caregiver to track your heart activity for several days, if needed.  Stress tests by exercise or by  giving medicine that makes the heart beat faster. TREATMENT  Treatment of palpitations depends on the cause of your symptoms and can vary greatly. Most cases of palpitations do not require any treatment other than time, relaxation, and monitoring your symptoms. Other causes, such as atrial fibrillation, atrial flutter, or supraventricular tachycardia, usually require further treatment. HOME CARE INSTRUCTIONS   Avoid:  Caffeinated coffee, tea, soft drinks, diet pills, and energy drinks.  Chocolate.  Alcohol.  Stop smoking if you smoke.  Reduce your stress and anxiety. Things that can help you relax include:  A method that measures bodily functions so you can learn to control them (biofeedback).  Yoga.  Meditation.  Physical activity such as swimming, jogging, or walking.  Get plenty of rest and sleep. SEEK MEDICAL CARE IF:   You continue to have a fast or irregular heartbeat beyond 24 hours.  Your palpitations occur more often. SEEK IMMEDIATE MEDICAL CARE IF:  You develop chest pain or shortness of breath.  You have a severe headache.  You feel dizzy, or you faint. MAKE SURE YOU:  Understand these instructions.  Will watch your condition.  Will get help right away if you are not doing well or get worse. Document Released: 04/03/2000 Document Revised: 10/06/2011 Document Reviewed: 06/05/2011 [facility] Patient Information 2013 [place], [place].

## 2012-07-07 ENCOUNTER — Other Ambulatory Visit: Payer: Self-pay | Admitting: *Deleted

## 2012-07-07 NOTE — Telephone Encounter (Signed)
MD seen pt yesterday faxing renewal on her alprazolam to [facility]...[name]

## 2012-07-11 ENCOUNTER — Other Ambulatory Visit: Payer: Self-pay | Admitting: *Deleted

## 2012-07-11 DIAGNOSIS — I158 Other secondary hypertension: Secondary | ICD-10-CM | POA: Diagnosis not present

## 2012-07-11 DIAGNOSIS — S61209A Unspecified open wound of unspecified finger without damage to nail, initial encounter: Secondary | ICD-10-CM | POA: Diagnosis not present

## 2012-07-11 MED ORDER — ALPRAZOLAM 0.25 MG PO TABS: 0.2500 mg | ORAL_TABLET | Freq: Three times a day (TID) | ORAL | Status: AC | PRN

## 2012-07-11 MED ORDER — ZOLPIDEM TARTRATE 5 MG PO TABS: 5.0000 mg | ORAL_TABLET | Freq: Every evening | ORAL | Status: DC | PRN

## 2012-07-11 NOTE — Telephone Encounter (Signed)
Faxed script bck to [facility].../lmb 

## 2012-08-16 DIAGNOSIS — M545 Low back pain, unspecified: Secondary | ICD-10-CM | POA: Diagnosis not present

## 2012-08-16 DIAGNOSIS — M549 Dorsalgia, unspecified: Secondary | ICD-10-CM | POA: Diagnosis not present

## 2012-08-26 DIAGNOSIS — M19019 Primary osteoarthritis, unspecified shoulder: Secondary | ICD-10-CM | POA: Diagnosis not present

## 2012-08-29 ENCOUNTER — Telehealth: Payer: Self-pay | Admitting: Internal Medicine

## 2012-08-29 NOTE — Telephone Encounter (Signed)
Called pt no answer LMOM RTC.../lmb 

## 2012-08-29 NOTE — Telephone Encounter (Signed)
The patient called and is hoping to discuss her medications with the nurse.  Her callback number is [phone]

## 2012-08-30 NOTE — Telephone Encounter (Signed)
Called pt again still no answer LMOM RTC.../lmb 

## 2012-08-30 NOTE — Telephone Encounter (Signed)
Notified pt with md response.../lmb 

## 2012-08-30 NOTE — Telephone Encounter (Signed)
Pt return call bck wanting to know does she have to continue taking the ferrex. Saw her md that did the surgery on her shoulder hemoglobin  was normal but was told to ask PCP if she need to continue...Raechel Chute

## 2012-08-30 NOTE — Telephone Encounter (Signed)
Ok to stop iron (ferrix) since hemoglobin is reportedly normal -  We will check next OV thanks

## 2012-09-05 ENCOUNTER — Ambulatory Visit (INDEPENDENT_AMBULATORY_CARE_PROVIDER_SITE_OTHER): Payer: Medicare Other | Admitting: Internal Medicine

## 2012-09-05 ENCOUNTER — Encounter: Payer: Self-pay | Admitting: Internal Medicine

## 2012-09-05 VITALS — BP 148/72 | HR 60 | Temp 98.2°F | Wt 162.0 lb

## 2012-09-05 DIAGNOSIS — I10 Essential (primary) hypertension: Secondary | ICD-10-CM

## 2012-09-05 DIAGNOSIS — E785 Hyperlipidemia, unspecified: Secondary | ICD-10-CM

## 2012-09-05 DIAGNOSIS — M545 Low back pain, unspecified: Secondary | ICD-10-CM | POA: Diagnosis not present

## 2012-09-05 DIAGNOSIS — E119 Type 2 diabetes mellitus without complications: Secondary | ICD-10-CM | POA: Diagnosis not present

## 2012-09-05 DIAGNOSIS — M5430 Sciatica, unspecified side: Secondary | ICD-10-CM | POA: Diagnosis not present

## 2012-09-05 DIAGNOSIS — G8929 Other chronic pain: Secondary | ICD-10-CM

## 2012-09-05 DIAGNOSIS — M5431 Sciatica, right side: Secondary | ICD-10-CM

## 2012-09-05 DIAGNOSIS — IMO0001 Reserved for inherently not codable concepts without codable children: Secondary | ICD-10-CM | POA: Diagnosis not present

## 2012-09-05 NOTE — Assessment & Plan Note (Signed)
Last lipids reviewed - at goal - check annually continue Lipitor as ongoing (hx CAD/CABG) 

## 2012-09-05 NOTE — Assessment & Plan Note (Signed)
On metformin -The current medical regimen (metformin) is effective;  continue present plan and medications. Check a1c q51mo -adjust as needed Lab Results  Component Value Date   HGBA1C 6.0 07/06/2012

## 2012-09-05 NOTE — Assessment & Plan Note (Signed)
Diffuse arthritic pains complicated by myalgias Takes daily low-dose meloxicam for same Supportive care and physical therapy as ongoing

## 2012-09-05 NOTE — Patient Instructions (Signed)
It was good to see you today. We have reviewed your prior records including labs and tests today We have completed the form for your back brace approval today - this will be faxed to your medical provider as requested Medications reviewed, no additional changes today Keep followup as planned for diabetes, please call sooner if problems

## 2012-09-05 NOTE — Progress Notes (Signed)
  Subjective:    Patient ID: [name], female    DOB: [birth year], 79 y.o.   MRN: [id number]  HPI  Requests form for medical back brace to be completed  Past Medical History  Diagnosis Date  . S/P CABG x 1   . S/P inguinal hernia repair   . 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 CAD, NATIVE VESSEL 06/09/2008  . UNSPECIFIED ANEMIA   . DIABETES MELLITUS, TYPE II   . GERD   . HYPERLIPIDEMIA   . HYPERTENSION   . HIATAL HERNIA   . FIBROMYALGIA   . OA (osteoarthritis)     severe, shoulders, hands - inflammatory, ?RA  . Gout   . Insomnia     Review of Systems  Constitutional: Negative for fever and fatigue.  Respiratory: Negative for cough and shortness of breath.   Cardiovascular: Negative for chest pain and leg swelling.  Musculoskeletal: Positive for myalgias and back pain. Negative for joint swelling.       Objective:   Physical Exam BP 148/72  Pulse 60  Temp(Src) 98.2 F (36.8 C) (Oral)  Wt 162 lb (73.483 kg)  BMI 33.87 kg/m2  SpO2 96% Wt Readings from Last 3 Encounters:  09/05/12 162 lb (73.483 kg)  07/06/12 163 lb (73.936 kg)  05/05/12 163 lb (73.936 kg)   Constitutional: She appears well-developed and well-nourished. No distress.  Neck: Normal range of motion. Neck supple. No JVD present. No thyromegaly present.  Cardiovascular: Normal rate, regular rhythm and normal heart sounds.  No murmur heard. No BLE edema. Pulmonary/Chest: Effort normal and breath sounds normal. No respiratory distress. She has no wheezes.  Musculoskeletal: Back: full range of motion of thoracic and lumbar spine. Non tender to palpation. Negative straight leg raise. DTR's are symmetrically intact. Sensation intact in all dermatomes of the lower extremities. Full strength to manual muscle testing. patient is able to heel toe walk without difficulty but ambulates with antalgic gait. Skin: Skin is warm and dry. No rash noted. No erythema.  Psychiatric: She has a normal mood and  affect. Her behavior is normal. Judgment and thought content normal.   Lab Results  Component Value Date   WBC 5.9 07/06/2012   HGB 12.9 07/06/2012   HCT 39.1 07/06/2012   PLT 184.0 07/06/2012   GLUCOSE 102* 07/06/2012   CHOL 180 07/03/2011   TRIG 55.0 07/03/2011   HDL 108.30 07/03/2011   LDLDIRECT 115.3 09/30/2009   LDLCALC 61 07/03/2011   ALT 20 07/06/2012   AST 27 07/06/2012   NA 139 07/06/2012   K 4.0 07/06/2012   CL 105 07/06/2012   CREATININE 0.9 07/06/2012   BUN 12 07/06/2012   CO2 26 07/06/2012   TSH 0.92 07/06/2012   INR 0.99 11/06/2010   HGBA1C 6.0 07/06/2012   MICROALBUR 1.1 09/30/2009        Assessment & Plan:   Chronic low back pain - follows with ortho [facility] for same -  frequent ESI q3-15mo  for right side sciatica and for hip pain Completed form for medical back brace equipment today (has used same since 1990s) Continue meloxicam daily and follow up PT/ortho as planned

## 2012-09-05 NOTE — Assessment & Plan Note (Signed)
Fair control - continue ongoing medical therapy as ongoing  BP Readings from Last 3 Encounters:  09/05/12 148/72  07/06/12 132/72  05/05/12 154/77

## 2012-09-15 ENCOUNTER — Other Ambulatory Visit: Payer: Self-pay | Admitting: Internal Medicine

## 2012-09-19 ENCOUNTER — Other Ambulatory Visit: Payer: Self-pay | Admitting: Internal Medicine

## 2012-09-23 ENCOUNTER — Other Ambulatory Visit: Payer: Self-pay | Admitting: Internal Medicine

## 2012-09-27 ENCOUNTER — Telehealth: Payer: Self-pay | Admitting: *Deleted

## 2012-09-27 MED ORDER — ZOSTER VACCINE LIVE 19400 UNT/0.65ML ~~LOC~~ SOLR: 0.6500 mL | Freq: Once | SUBCUTANEOUS | Status: DC

## 2012-09-27 NOTE — Telephone Encounter (Signed)
Received fax pt is wanting rx for zostavax sent to [facility]...lmb

## 2012-10-09 ENCOUNTER — Other Ambulatory Visit: Payer: Self-pay | Admitting: Internal Medicine

## 2012-10-10 ENCOUNTER — Other Ambulatory Visit: Payer: Self-pay | Admitting: Internal Medicine

## 2012-10-10 DIAGNOSIS — M25559 Pain in unspecified hip: Secondary | ICD-10-CM | POA: Diagnosis not present

## 2012-10-10 DIAGNOSIS — M549 Dorsalgia, unspecified: Secondary | ICD-10-CM | POA: Diagnosis not present

## 2012-10-10 DIAGNOSIS — M47817 Spondylosis without myelopathy or radiculopathy, lumbosacral region: Secondary | ICD-10-CM | POA: Diagnosis not present

## 2012-10-12 ENCOUNTER — Encounter: Payer: Self-pay | Admitting: Internal Medicine

## 2012-10-12 ENCOUNTER — Ambulatory Visit (INDEPENDENT_AMBULATORY_CARE_PROVIDER_SITE_OTHER): Payer: Medicare Other | Admitting: Internal Medicine

## 2012-10-12 VITALS — BP 142/80 | HR 56 | Temp 98.2°F | Wt 163.4 lb

## 2012-10-12 DIAGNOSIS — L608 Other nail disorders: Secondary | ICD-10-CM | POA: Diagnosis not present

## 2012-10-12 DIAGNOSIS — L609 Nail disorder, unspecified: Secondary | ICD-10-CM

## 2012-10-12 MED ORDER — MELOXICAM 7.5 MG PO TABS: 7.5000 mg | ORAL_TABLET | Freq: Every day | ORAL | Status: DC

## 2012-10-12 NOTE — Progress Notes (Signed)
  Subjective:    Patient ID: [name], female    DOB: Jun 03, 1930, 79 y.o.   MRN: [id number]  HPI  Here for concerns about nail problems See PA student note  Past Medical History  Diagnosis Date  . S/P CABG x 1   . S/P inguinal hernia repair   . 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 CAD, NATIVE VESSEL 06/09/2008  . UNSPECIFIED ANEMIA   . DIABETES MELLITUS, TYPE II   . GERD   . HYPERLIPIDEMIA   . HYPERTENSION   . HIATAL HERNIA   . FIBROMYALGIA   . OA (osteoarthritis)     severe, shoulders, hands - inflammatory, ?RA  . Gout   . Insomnia     Review of Systems  Respiratory: Negative for cough and shortness of breath.   Skin: Negative for rash and wound.       Objective:   Physical Exam BP 142/80  Pulse 56  Temp(Src) 98.2 F (36.8 C) (Oral)  Wt 163 lb 6.4 oz (74.118 kg)  BMI 34.16 kg/m2  SpO2 97% Wt Readings from Last 3 Encounters:  10/12/12 163 lb 6.4 oz (74.118 kg)  09/05/12 162 lb (73.483 kg)  07/06/12 163 lb (73.936 kg)   Geb: NAD, nontoxic Skin: nails with brittle changes at tips of R>L index fingernail - long ridge - no discoloration, no erythema or infection  Lab Results  Component Value Date   WBC 5.9 07/06/2012   HGB 12.9 07/06/2012   HCT 39.1 07/06/2012   PLT 184.0 07/06/2012   GLUCOSE 102* 07/06/2012   CHOL 180 07/03/2011   TRIG 55.0 07/03/2011   HDL 108.30 07/03/2011   LDLDIRECT 115.3 09/30/2009   LDLCALC 61 07/03/2011   ALT 20 07/06/2012   AST 27 07/06/2012   NA 139 07/06/2012   K 4.0 07/06/2012   CL 105 07/06/2012   CREATININE 0.9 07/06/2012   BUN 12 07/06/2012   CO2 26 07/06/2012   TSH 0.92 07/06/2012   INR 0.99 11/06/2010   HGBA1C 6.0 07/06/2012   MICROALBUR 1.1 09/30/2009   Iron/TIBC/Ferritin    Component Value Date/Time   IRON 23* 02/10/2011 1151        Assessment & Plan:   Nail changes - The patient is reassured that these symptoms do not appear to represent a serious or threatening condition. Supportive care, OTC supplements -  review of recent labs do indicate medical cause for nail changes - suspect related to aging and underlying inflammatory arthritis (controlled)  Time spent with pt/student today 15 minutes, greater than 50% time spent counseling patient on nail changes and medication review. Also review of prior records

## 2012-10-12 NOTE — Progress Notes (Signed)
  Subjective:    Patient ID: [name], female    DOB: April 11, 1931, 79 y.o.   MRN: [id number]  HPI  Patient presents today for a complaint of "finger nail changes". Patient reports nail weakness, peeling and impeded growth for the last several years. Has stopped using nail polish, saw no relief with a prescription nail polish and has seen no results with adding a hair, skin and nails multivitamin to her diet. Denies similar changes to toenails, intolerance to heat and cold, or recent trauma to nails or hands.   Review of Systems HEENT: denies similar changes in toe nails and notes thickening of toe nails, reports skin as being "great" since she began her hair/skin/nails multivitamin. Notes thinning hair but denies hair breakage. Denied cold or heat intolerance.   Cardio: denies chest pain, chest tightness, palpitations Pulm: denies shortness of breath, cough GI: denies changes in bowel or bladder habits, hematuria, hematochezia Neuro: denies paresthesias in hands or feet, or focal weakness MSK: reports arthritic type finger pain bilaterally, however states it is improved since the last visit. Denies pain or coolness in fingers.      Objective:   Physical Exam General: well developed, well nourished, in no acute distress. HEENT: skin warm and moist, hair thinning consistent with aging,   Cardio: RRR, no MGR Pulm: clear to A&P Neuro: no focal motor or sensory deficits MSK: full ROM in bilateral hands, +2 capillary refills and +radial 2 pulses bilaterally. Noted minor DIP thickening consistent with arthritic changes. Nails were noted to be thin and plyable with visible peeling and breakage. Longitudinal grooving noted, no discoloration, affects all nails uniformly. Toe nails hypertrophied, painted with polish so unable to visualize.         Assessment & Plan:

## 2012-10-12 NOTE — Patient Instructions (Signed)
It was good to see you today. We have reviewed your prior records including labs and tests today Your nail changes are normal - no evidence for medical problem causing this change - Continue the ongoing treatment as you are doing: avoid manicures or nail procedures, avoid prolonged exposure to water such as bathing and is watching. Avoid nail polish and continue your nail supplement with biotin. Followup as needed, call if problems

## 2012-10-13 DIAGNOSIS — M545 Low back pain, unspecified: Secondary | ICD-10-CM | POA: Diagnosis not present

## 2012-10-18 DIAGNOSIS — M25559 Pain in unspecified hip: Secondary | ICD-10-CM | POA: Diagnosis not present

## 2012-10-18 DIAGNOSIS — M545 Low back pain, unspecified: Secondary | ICD-10-CM | POA: Diagnosis not present

## 2012-10-24 DIAGNOSIS — M545 Low back pain, unspecified: Secondary | ICD-10-CM | POA: Diagnosis not present

## 2012-10-26 DIAGNOSIS — M545 Low back pain, unspecified: Secondary | ICD-10-CM | POA: Diagnosis not present

## 2012-10-31 DIAGNOSIS — M545 Low back pain, unspecified: Secondary | ICD-10-CM | POA: Diagnosis not present

## 2012-11-02 DIAGNOSIS — M545 Low back pain, unspecified: Secondary | ICD-10-CM | POA: Diagnosis not present

## 2012-11-02 DIAGNOSIS — M25559 Pain in unspecified hip: Secondary | ICD-10-CM | POA: Diagnosis not present

## 2012-11-09 DIAGNOSIS — M545 Low back pain, unspecified: Secondary | ICD-10-CM | POA: Diagnosis not present

## 2012-11-14 DIAGNOSIS — M545 Low back pain, unspecified: Secondary | ICD-10-CM | POA: Diagnosis not present

## 2012-11-22 DIAGNOSIS — M21619 Bunion of unspecified foot: Secondary | ICD-10-CM | POA: Diagnosis not present

## 2012-11-22 DIAGNOSIS — M19079 Primary osteoarthritis, unspecified ankle and foot: Secondary | ICD-10-CM | POA: Diagnosis not present

## 2012-11-22 DIAGNOSIS — M25569 Pain in unspecified knee: Secondary | ICD-10-CM | POA: Diagnosis not present

## 2012-11-23 ENCOUNTER — Other Ambulatory Visit: Payer: Self-pay

## 2012-12-04 ENCOUNTER — Other Ambulatory Visit: Payer: Self-pay | Admitting: Internal Medicine

## 2012-12-20 DIAGNOSIS — M5126 Other intervertebral disc displacement, lumbar region: Secondary | ICD-10-CM | POA: Diagnosis not present

## 2012-12-22 ENCOUNTER — Other Ambulatory Visit (INDEPENDENT_AMBULATORY_CARE_PROVIDER_SITE_OTHER): Payer: Medicare Other

## 2012-12-22 ENCOUNTER — Encounter: Payer: Self-pay | Admitting: Internal Medicine

## 2012-12-22 ENCOUNTER — Ambulatory Visit (INDEPENDENT_AMBULATORY_CARE_PROVIDER_SITE_OTHER): Payer: Medicare Other | Admitting: Internal Medicine

## 2012-12-22 VITALS — BP 150/90 | HR 94 | Temp 98.8°F | Wt 163.2 lb

## 2012-12-22 DIAGNOSIS — E119 Type 2 diabetes mellitus without complications: Secondary | ICD-10-CM | POA: Diagnosis not present

## 2012-12-22 DIAGNOSIS — Z23 Encounter for immunization: Secondary | ICD-10-CM | POA: Diagnosis not present

## 2012-12-22 DIAGNOSIS — M129 Arthropathy, unspecified: Secondary | ICD-10-CM

## 2012-12-22 DIAGNOSIS — I2510 Atherosclerotic heart disease of native coronary artery without angina pectoris: Secondary | ICD-10-CM

## 2012-12-22 LAB — LIPID PANEL
Cholesterol: 182 mg/dL (ref 0–200)
HDL: 97.5 mg/dL (ref >39.00)
LDL Cholesterol: 74 mg/dL (ref 0–99)
Total CHOL/HDL Ratio: 2
Triglycerides: 51 mg/dL (ref 0.0–149.0)
VLDL: 10.2 mg/dL (ref 0.0–40.0)

## 2012-12-22 LAB — MICROALBUMIN / CREATININE URINE RATIO
Creatinine,U: 51.7 mg/dL
Microalb Creat Ratio: 6.6 mg/g (ref 0.0–30.0)
Microalb, Ur: 3.4 mg/dL — ABNORMAL HIGH (ref 0.0–1.9)

## 2012-12-22 LAB — HEMOGLOBIN A1C: Hgb A1c MFr Bld: 6 % (ref 4.6–6.5)

## 2012-12-22 MED ORDER — ZOLPIDEM TARTRATE 5 MG PO TABS: 5.0000 mg | ORAL_TABLET | Freq: Every evening | ORAL | Status: DC | PRN

## 2012-12-22 NOTE — Patient Instructions (Signed)
It was good to see you today. Medications reviewed and updated, no changes recommended at this time. Refill on medication(s) as discussed today. Test(s) ordered today. Your results will be released to MyChart (or called to you) after review, usually within 72hours after test completion. If any changes need to be made, you will be notified at that same time. Flu shot and pneumonia shot provided today Followup in 3 months for blood pressure and sugar check, call sooner if problems

## 2012-12-22 NOTE — Assessment & Plan Note (Signed)
End stage DJD of shoulders and inflamm arthropathy of hands/wrists - ?RA S/p R total shoulder 10/2010 improved with NSAIDs and PT -  Currently on 6d pred taper and tylenol #3 prn F/u rheum and ortho as planned/as needed

## 2012-12-22 NOTE — Assessment & Plan Note (Signed)
On metformin -The current medical regimen (metformin) is effective;  continue present plan and medications. Check a1c q76mo -adjust as needed Lab Results  Component Value Date   HGBA1C 6.0 07/06/2012

## 2012-12-22 NOTE — Assessment & Plan Note (Signed)
No angina - on med mgmt for same Check lipids - follow up cards prn (ross)

## 2012-12-22 NOTE — Progress Notes (Signed)
  Subjective:    Patient ID: [name], female    DOB: 24-Jul-1930, 79 y.o.   MRN: [id number]  HPI  Here for follow up - reviewed chronic medical issues and interval medical events  On Pred for inflammatory arthritis flare  Past Medical History  Diagnosis Date  . S/P CABG x 1   . S/P inguinal hernia repair   . 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 CAD, NATIVE VESSEL 06/09/2008  . UNSPECIFIED ANEMIA   . DIABETES MELLITUS, TYPE II   . GERD   . HYPERLIPIDEMIA   . HYPERTENSION   . HIATAL HERNIA   . FIBROMYALGIA   . OA (osteoarthritis)     severe, shoulders, hands - inflammatory, ?RA  . Gout   . Insomnia     Review of Systems  Constitutional: Negative for fever and fatigue.  Respiratory: Negative for cough and shortness of breath.   Cardiovascular: Negative for chest pain and leg swelling.  Musculoskeletal: Positive for myalgias and back pain. Negative for joint swelling.       Objective:   Physical Exam BP 150/90  Pulse 94  Temp(Src) 98.8 F (37.1 C) (Oral)  Wt 163 lb 4 oz (74.05 kg)  BMI 34.13 kg/m2  SpO2 97% Wt Readings from Last 3 Encounters:  12/22/12 163 lb 4 oz (74.05 kg)  10/12/12 163 lb 6.4 oz (74.118 kg)  09/05/12 162 lb (73.483 kg)   Constitutional: She appears well-developed and well-nourished. No distress.  Neck: Normal range of motion. Neck supple. No JVD present. No thyromegaly present.  Cardiovascular: Normal rate, regular rhythm and normal heart sounds.  No murmur heard. No BLE edema. Pulmonary/Chest: Effort normal and breath sounds normal. No respiratory distress. She has no wheezes.  MSkel: Skin: Skin is warm and dry. No rash noted. No erythema.  Psychiatric: She has a normal mood and affect. Her behavior is normal. Judgment and thought content normal.   Lab Results  Component Value Date   WBC 5.9 07/06/2012   HGB 12.9 07/06/2012   HCT 39.1 07/06/2012   PLT 184.0 07/06/2012   GLUCOSE 102* 07/06/2012   CHOL 180 07/03/2011   TRIG 55.0  07/03/2011   HDL 108.30 07/03/2011   LDLDIRECT 115.3 09/30/2009   LDLCALC 61 07/03/2011   ALT 20 07/06/2012   AST 27 07/06/2012   NA 139 07/06/2012   K 4.0 07/06/2012   CL 105 07/06/2012   CREATININE 0.9 07/06/2012   BUN 12 07/06/2012   CO2 26 07/06/2012   TSH 0.92 07/06/2012   INR 0.99 11/06/2010   HGBA1C 6.0 07/06/2012   MICROALBUR 1.1 09/30/2009        Assessment & Plan:   See problem list. Medications and labs reviewed today.

## 2012-12-26 ENCOUNTER — Ambulatory Visit: Payer: Medicare Other | Admitting: Internal Medicine

## 2012-12-30 ENCOUNTER — Telehealth: Payer: Self-pay | Admitting: Internal Medicine

## 2012-12-30 DIAGNOSIS — E785 Hyperlipidemia, unspecified: Secondary | ICD-10-CM

## 2012-12-30 DIAGNOSIS — E119 Type 2 diabetes mellitus without complications: Secondary | ICD-10-CM

## 2012-12-30 DIAGNOSIS — I10 Essential (primary) hypertension: Secondary | ICD-10-CM

## 2012-12-30 DIAGNOSIS — I2510 Atherosclerotic heart disease of native coronary artery without angina pectoris: Secondary | ICD-10-CM

## 2012-12-30 NOTE — Telephone Encounter (Signed)
Called pt no answer LMOM with md response.../lmb 

## 2012-12-30 NOTE — Telephone Encounter (Signed)
The patient called and is hoping to get a referral to a nutrition counselor.  She didn't stated the reason for this referral.  Her callback number is [phone]   Thanks!

## 2012-12-30 NOTE — Telephone Encounter (Signed)
Refer done.

## 2013-01-03 DIAGNOSIS — M5126 Other intervertebral disc displacement, lumbar region: Secondary | ICD-10-CM | POA: Diagnosis not present

## 2013-01-03 DIAGNOSIS — M25559 Pain in unspecified hip: Secondary | ICD-10-CM | POA: Diagnosis not present

## 2013-01-04 ENCOUNTER — Ambulatory Visit: Payer: Medicare Other | Admitting: Internal Medicine

## 2013-01-17 ENCOUNTER — Encounter: Payer: Self-pay | Admitting: Family Medicine

## 2013-01-17 ENCOUNTER — Ambulatory Visit (INDEPENDENT_AMBULATORY_CARE_PROVIDER_SITE_OTHER): Payer: Medicare Other | Admitting: Family Medicine

## 2013-01-17 ENCOUNTER — Ambulatory Visit: Payer: Medicare Other | Admitting: Internal Medicine

## 2013-01-17 VITALS — BP 158/80 | HR 84 | Wt 167.0 lb

## 2013-01-17 DIAGNOSIS — M76899 Other specified enthesopathies of unspecified lower limb, excluding foot: Secondary | ICD-10-CM | POA: Diagnosis not present

## 2013-01-17 DIAGNOSIS — M7061 Trochanteric bursitis, right hip: Secondary | ICD-10-CM

## 2013-01-17 DIAGNOSIS — G8929 Other chronic pain: Secondary | ICD-10-CM

## 2013-01-17 DIAGNOSIS — M545 Low back pain, unspecified: Secondary | ICD-10-CM

## 2013-01-17 MED ORDER — GABAPENTIN 100 MG PO CAPS
100.0000 mg | ORAL_CAPSULE | Freq: Every day | ORAL | Status: DC
Start: 2013-01-17 — End: 2013-05-08

## 2013-01-17 NOTE — Assessment & Plan Note (Signed)
Patient does have chronic low back pain but I do not think patient's radiation down the legs is a true radiculopathy and more of the hamstring tightness at this time. Patient given home exercise program, as well as gabapentin to start at night. Patient will try these interventions and come back again in 3 weeks. If she continues to have pain we'll consider getting repeat x-rays of her lumbar spine. Depending on the findings that could help guide medical management.

## 2013-01-17 NOTE — Assessment & Plan Note (Signed)
Injected his described above. Patient tolerated the procedure well and did have pain immediately resolved. Patient given home exercise program focusing on hip abductor strength as well as core strengthening. Discussed proper shoe wear Patient and will come back again in 3 weeks for further evaluation.

## 2013-01-17 NOTE — Patient Instructions (Signed)
Very nice to meet you You are going to be one of my favorite Two new exercises packets.  Try them most days of the week.  I will add a little medication to help at night Come back and see me again in 3 weeks.

## 2013-01-17 NOTE — Progress Notes (Signed)
CC: Right hip and back pain  HPI: Patient is a very pleasant 79 year old female who is coming in with right hip pain and back pain. Patient does have a past medical history significant for diabetes, and multiple surgeries including bilateral knee replacement, and bilateral reverse shoulder arthroplasty. Patient does have known osteoarthritis of the hips bilaterally as well as her back. Patient does state that she did have a microdiscectomy x2 multiple years ago. Patient is also over the course of the last couple years undergone epidural steroid injections. Patient states that she is having some mild exacerbation with pain mostly in the center lumbar spine that does radiate down the posterior aspect of both legs. Patient denies any bowel and bladder incontinence, denies any weakness, and denies any constant numbness. Patient has had this pain intermittently for quite some time and states that this might be worse than usual but she cannot tell because her hip is hurting her more. Severity 7/10 patient has been doing her stretches which have been minimally beneficial.  Patient is having significant amount of pain of right lateral hip. Patient has seen Dr. Luiz Blare previously and did have an injection in the right hip approximately one year ago. Patient states that this pain seems to be localized in very tender to palpation. Patient has had to adjust her sleeping position. Patient describes it is more of a dull aching sensation that can be very severe and sharp when pushed or leg is in the right position. Patient denies any groin pain. Patient is a severity of 8/10. Patient has tried some mild stretches which has been minimally beneficial.   Past medical, surgical, family and social history reviewed. Medications reviewed all in the electronic medical record.   Review of Systems: No headache, visual changes, nausea, vomiting, diarrhea, constipation, dizziness, abdominal pain, skin rash, fevers, chills, night  sweats, weight loss, swollen lymph nodes, body aches, joint swelling, muscle aches, chest pain, shortness of breath, mood changes.   Objective:    Blood pressure 158/80, pulse 84, weight 167 lb (75.751 kg), SpO2 98.00%.   General: No apparent distress alert and oriented x3 mood and affect normal, dressed appropriately.  HEENT: Pupils equal, extraocular movements intact Respiratory: Patient's speak in full sentences and does not appear short of breath Cardiovascular: No lower extremity edema, non tender, no erythema Skin: Warm dry intact with no signs of infection or rash on extremities or on axial skeleton. Abdomen: Soft nontender Neuro: Cranial nerves II through XII are intact, neurovascularly intact in all extremities with 2+ DTRs and 2+ pulses. Lymph: No lymphadenopathy of posterior or anterior cervical chain or axillae bilaterally.  Gait antalgic ambulating with a cane MSK: Non tender with full range of motion and good stability and symmetric strength and tone of shoulders, elbows, wrist,  knee and ankles bilaterally.  Back Exam:  Inspection: Unremarkable  Motion: Flexion 45 deg, Extension 25 deg, Side Bending to 25 deg bilaterally,  Rotation to 45 deg bilaterally  SLR laying: Negative  XSLR laying: Negative  Palpable tenderness: Bilateral paraspinal musculature of the lumbar spine no spinous process tenderness. FABER: Unable to do due to the knee replacement. Sensory change: Gross sensation intact to all lumbar and sacral dermatomes.  Reflexes: 2+ at both patellar tendons, 2+ at achilles tendons, Babinski's downgoing.  Strength at foot  Plantar-flexion: 5/5 Dorsi-flexion: 5/5 Eversion: 5/5 Inversion: 5/5  Leg strength  Quad: 5/5 Hamstring: 5/5 Hip flexor: 5/5 Hip abductors: 5/5  Hip: Right ROM IR: 15 Deg, with mild groin  ER: 35 Deg, Flexion: 100 Deg, Extension: 100 Deg, Abduction: 45 Deg, Adduction: 45 Deg Strength IR: 5/5, ER: 5/5, Flexion: 5/5, Extension: 5/5, Abduction: 5/5,  Adduction: 5/5 Pelvic alignment unremarkable to inspection and palpation. Greater trochanter severely tender to palpation No tenderness over piriformis  Mild SI joint tenderness and normal  SI movement.   Procedure: Real-time Ultrasound Guided Injection of right greater trochanteric bursa Device: GE Logiq E  Ultrasound guided injection is preferred based studies that show increased duration, increased effect, greater accuracy, decreased procedural pain, increased response rate with ultrasound guided versus blind injection.  Verbal informed consent obtained.  Time-out conducted.  Noted no overlying erythema, induration, or other signs of local infection.  Skin prepped in a sterile fashion.  Local anesthesia: Topical Ethyl chloride.  With sterile technique and under real time ultrasound guidance:  Anterior capsule visualized, needle visualized going to t calcific bursa superficial to the greater trochanteric area. Pictures taken. Patient did have injection of 3 cc of 1% lidocaine, 3 cc of 0.5% Marcaine, and 1 cc of Kenalog 40 mg/dL. Completed without difficulty  Pain immediately resolved suggesting accurate placement of the medication.  Advised to call if fevers/chills, erythema, induration, drainage, or persistent bleeding.  Images permanently stored and available for review in the ultrasound unit.  Impression: Technically successful ultrasound guided injection.   Impression and Recommendations:     This case required medical decision making of moderate complexity.

## 2013-01-28 ENCOUNTER — Other Ambulatory Visit: Payer: Self-pay | Admitting: Internal Medicine

## 2013-02-01 ENCOUNTER — Encounter: Payer: Medicare Other | Attending: Internal Medicine | Admitting: Dietician

## 2013-02-01 VITALS — Ht 59.0 in | Wt 164.1 lb

## 2013-02-01 DIAGNOSIS — E119 Type 2 diabetes mellitus without complications: Secondary | ICD-10-CM

## 2013-02-01 DIAGNOSIS — Z713 Dietary counseling and surveillance: Secondary | ICD-10-CM | POA: Insufficient documentation

## 2013-02-02 ENCOUNTER — Encounter: Payer: Self-pay | Admitting: Dietician

## 2013-02-02 NOTE — Progress Notes (Signed)
Patient was seen on 02/01/13 for the first of a series of three diabetes self-management courses at the [facility].  Current HbA1c: 6.0 on 9/4  The following learning objectives were met by the patient during this class:  Describe diabetes  State some common risk factors for diabetes  Defines the role of glucose and insulin  Identifies type of diabetes and pathophysiology  Describe the relationship between diabetes and cardiovascular risk  State the members of the Healthcare Team  States the rationale for glucose monitoring  State when to test glucose  State their individual Target Range  State the importance of logging glucose readings  Describe how to interpret glucose readings  Identifies A1C target  Explain the correlation between A1c and eAG values  State symptoms and treatment of high blood glucose  State symptoms and treatment of low blood glucose  Explain proper technique for glucose testing  Identifies proper sharps disposal  Handouts given during class include:  Living Well with Diabetes book  Carb Counting and Meal Planning book  Meal Plan Card  Carbohydrate guide  Meal planning worksheet  Low Sodium Flavoring Tips  The diabetes portion plate  Low Carbohydrate Snack Suggestions  A1c to eAG Conversion Chart  Diabetes Medications  Stress Management  Diabetes Recommended Care Schedule  Diabetes Success Plan  Core Class Satisfaction Survey  Your patient has identified their diabetes care support plan as:  [facility]  Staff  Follow-Up Plan:  Attend core 2

## 2013-02-02 NOTE — Patient Instructions (Signed)
Goals:  Monitor glucose levels as instructed by your doctor 

## 2013-02-07 ENCOUNTER — Ambulatory Visit (INDEPENDENT_AMBULATORY_CARE_PROVIDER_SITE_OTHER): Payer: Medicare Other | Admitting: Family Medicine

## 2013-02-07 ENCOUNTER — Encounter: Payer: Self-pay | Admitting: Family Medicine

## 2013-02-07 VITALS — BP 144/72 | HR 64

## 2013-02-07 DIAGNOSIS — G8929 Other chronic pain: Secondary | ICD-10-CM | POA: Diagnosis not present

## 2013-02-07 DIAGNOSIS — M545 Low back pain, unspecified: Secondary | ICD-10-CM | POA: Diagnosis not present

## 2013-02-07 DIAGNOSIS — M7061 Trochanteric bursitis, right hip: Secondary | ICD-10-CM

## 2013-02-07 DIAGNOSIS — M76899 Other specified enthesopathies of unspecified lower limb, excluding foot: Secondary | ICD-10-CM

## 2013-02-07 NOTE — Progress Notes (Signed)
CC: Right hip and back pain follow up  HPI: Patient is a very pleasant 79 year old female who is coming in with right hip pain and back pain follow up.  Patient was seen previously and was diagnosed with a greater trochanteric bursitis. Patient did have an ultrasound-guided injection of the greater trochanteric area and did have resolution of pain at that time. Patient states since then as well as doing home exercises she is 90% better. Patient states she did have pain for the first 3 days and then it seemed to completely resolve. Patient states that she is sleeping better at night as well as ambulating without any significant pain. Patient is doing 3/5 exercises on a regular basis. Patient is taking the Neurontin at night without any side effects. Patient is very happy with her results so far.   Patient does have a past medical history significant for diabetes, and multiple surgeries including bilateral knee replacement, and bilateral reverse shoulder arthroplasty. Patient does have known osteoarthritis of the hips bilaterally as well as her back. Patient does state that she did have a microdiscectomy x2 multiple years ago. Patient is also over the course of the last couple years undergone epidural steroid injections.    Past medical, surgical, family and social history reviewed. Medications reviewed all in the electronic medical record.   Review of Systems: No headache, visual changes, nausea, vomiting, diarrhea, constipation, dizziness, abdominal pain, skin rash, fevers, chills, night sweats, weight loss, swollen lymph nodes, body aches, joint swelling, muscle aches, chest pain, shortness of breath, mood changes.   Objective:    Blood pressure 144/72, pulse 64, SpO2 97.00%.   General: No apparent distress alert and oriented x3 mood and affect normal, dressed appropriately.  HEENT: Pupils equal, extraocular movements intact Respiratory: Patient's speak in full sentences and does not appear  short of breath Cardiovascular: No lower extremity edema, non tender, no erythema Skin: Warm dry intact with no signs of infection or rash on extremities or on axial skeleton. Abdomen: Soft nontender Neuro: Cranial nerves II through XII are intact, neurovascularly intact in all extremities with 2+ DTRs and 2+ pulses. Lymph: No lymphadenopathy of posterior or anterior cervical chain or axillae bilaterally.  Gait antalgic ambulating with a cane MSK: Non tender with full range of motion and good stability and symmetric strength and tone of shoulders, elbows, wrist,  knee and ankles bilaterally.  Back Exam:  Inspection: Unremarkable  Motion: Flexion 45 deg, Extension 25 deg, Side Bending to 25 deg bilaterally,  Rotation to 45 deg bilaterally  SLR laying: Negative  XSLR laying: Negative  Palpable tenderness: Bilateral paraspinal musculature of the lumbar spine no spinous process tenderness. FABER: Unable to do due to the knee replacement. Sensory change: Gross sensation intact to all lumbar and sacral dermatomes.  Reflexes: 2+ at both patellar tendons, 2+ at achilles tendons, Babinski's downgoing.  Strength at foot  Plantar-flexion: 5/5 Dorsi-flexion: 5/5 Eversion: 5/5 Inversion: 5/5  Leg strength  Quad: 5/5 Hamstring: 5/5 Hip flexor: 5/5 Hip abductors: 5/5  Hip: Right ROM IR: 15 Deg, with mild groin ER: 35 Deg, Flexion: 100 Deg, Extension: 100 Deg, Abduction: 45 Deg, Adduction: 45 Deg Strength IR: 5/5, ER: 5/5, Flexion: 5/5, Extension: 5/5, Abduction: 5/5, Adduction: 5/5 Pelvic alignment unremarkable to inspection and palpation. Greater trochanter nontender No tenderness over piriformis  Mild SI joint tenderness and normal  SI movement.      Impression and Recommendations:     This case required medical decision making of moderate complexity.

## 2013-02-07 NOTE — Assessment & Plan Note (Signed)
Resolved at this time, continued exercises 3 times a week. Discuss continue Neurontin for likely some mild neuropathic pain secondary to her arthritis Follow up in 6 weeks.

## 2013-02-07 NOTE — Patient Instructions (Addendum)
I am glad you are doing so much better Continue exercises 3 times a week Continue the neurontin at 100mg  at night.  For your arthritis here are some suggestions.  Take tylenol 650 mg three times a day is the best evidence based medicine we have for arthritis.  Aleve 1-2 tabs twice a day with food or ibuprofen 600mg  twice daily with food can be added to tylenol.  Glucosamine sulfate 750mg  twice a day is a supplement that has been shown to help moderate to severe arthritis. Vitamin D 1000IU daily Fish oil 3 grams daily.  Capsaicin topically up to four times a day may also help with pain. Controlling your weight is important.  Heat or ice 20 minutes at a time 3-4 times a day as needed to help with pain. Water aerobics and cycling with low resistance are the best two types of exercise for arthritis. Come back and see me in 6 weeks.

## 2013-02-07 NOTE — Assessment & Plan Note (Signed)
This is secondary to her osteoarthritic changes. Patient also has a history of 2 microdiscectomy's.  Discuss over-the-counter treatments and continuing to work on core exercises. Patient will try these interventions and then come back again in 6 weeks for further evaluation.

## 2013-02-08 ENCOUNTER — Other Ambulatory Visit: Payer: Self-pay | Admitting: Internal Medicine

## 2013-02-10 ENCOUNTER — Other Ambulatory Visit: Payer: Self-pay | Admitting: Internal Medicine

## 2013-02-13 ENCOUNTER — Encounter: Payer: Self-pay | Admitting: Internal Medicine

## 2013-02-13 ENCOUNTER — Ambulatory Visit (INDEPENDENT_AMBULATORY_CARE_PROVIDER_SITE_OTHER): Payer: Medicare Other | Admitting: Internal Medicine

## 2013-02-13 VITALS — BP 140/74 | HR 58 | Ht <= 58 in | Wt 163.0 lb

## 2013-02-13 DIAGNOSIS — I25810 Atherosclerosis of coronary artery bypass graft(s) without angina pectoris: Secondary | ICD-10-CM | POA: Diagnosis not present

## 2013-02-13 DIAGNOSIS — I10 Essential (primary) hypertension: Secondary | ICD-10-CM

## 2013-02-13 DIAGNOSIS — E785 Hyperlipidemia, unspecified: Secondary | ICD-10-CM

## 2013-02-13 NOTE — Progress Notes (Signed)
HPI Patient is a 79 year old with a history of CAD (s/p CABG in 2010), HTN, HL and fibromyalgia.  I saw OCt 2013.  Since seen she done very well.  Denies CP.  Denies SOB Is doing water aerobics regularly  No Known Allergies  Current Outpatient Prescriptions  Medication Sig Dispense Refill  . ALL DAY ALLERGY 10 MG tablet TAKE 1 TABLET (10 MG TOTAL) BY MOUTH DAILY.  30 tablet  5  . ALPRAZolam (XANAX) 0.25 MG tablet Take 1 by mouth three times a day as needed      . amLODipine (NORVASC) 5 MG tablet TAKE 1 TABLET DAILY  90 tablet  1  . aspirin 81 MG tablet Take 81 mg by mouth daily.      Marland Kitchen atorvastatin (LIPITOR) 40 MG tablet TAKE 1 TABLET DAILY  90 tablet  1  . carvedilol (COREG) 6.25 MG tablet TAKE 1 TABLET TWICE A DAY  WITH MEALS  180 tablet  1  . cetirizine (ZYRTEC) 10 MG tablet Take 1 tablet (10 mg total) by mouth daily.  90 tablet  1  . DEXILANT 60 MG capsule TAKE 1 CAPSULE DAILY  90 capsule  1  . gabapentin (NEURONTIN) 100 MG capsule Take 1 capsule (100 mg total) by mouth at bedtime.  30 capsule  1  . meloxicam (MOBIC) 7.5 MG tablet TAKE 1 TABLET (7.5 MG TOTAL) BY MOUTH DAILY.  30 tablet  1  . metFORMIN (GLUCOPHAGE XR) 500 MG 24 hr tablet Take 1 tablet (500 mg total) by mouth daily with breakfast.  90 tablet  1  . Multiple Vitamin (MULITIVITAMIN WITH MINERALS) TABS Take 1 tablet by mouth daily.      . polyethylene glycol (MIRALAX / GLYCOLAX) packet Take 17 g by mouth daily.      . ramipril (ALTACE) 5 MG tablet Take 1 tablet (5 mg total) by mouth daily.  90 tablet  1  . senna (SENOKOT) 8.6 MG TABS Take 1 tablet by mouth.      . zolpidem (AMBIEN) 5 MG tablet Take 1 tablet (5 mg total) by mouth at bedtime as needed. For sleep  30 tablet  5   No current facility-administered medications for this visit.    Past Medical History  Diagnosis Date  . S/P CABG x 1   . S/P inguinal hernia repair   . TUBULOVILLOUS ADENOMA, 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    colo 04/2012 - no polyps - no further screening  planned (age >101)  . CAD, NATIVE VESSEL 06/09/2008  . UNSPECIFIED ANEMIA   . DIABETES MELLITUS, TYPE II   . GERD   . HYPERLIPIDEMIA   . HYPERTENSION   . HIATAL HERNIA   . FIBROMYALGIA   . OA (osteoarthritis)     severe, shoulders, hands - inflammatory, ?RA  . Gout   . Insomnia     Past Surgical History  Procedure Laterality Date  . Hernia repair    . Abdominal hysterectomy  1981  . Coronary artery bypass graft  2010  . Appendectomy    . Replacement total knee bilateral    . Rotator cuff repair      Left  . Ovarian cyst removal    . Cataract extraction, bilateral    . Joint replacement    . Reverse shoulder arthroplasty  09/08/2011    Procedure: REVERSE SHOULDER ARTHROPLASTY;  Surgeon: Mable Paris, MD;  Location: [facility] OR;  Service: Orthopedics;  Laterality: Left;  left reverse total shoulder arthroplasty  Family History  Problem Relation Age of Onset  . Cancer Mother     Stomach  . Stomach cancer Mother 76  . Heart disease Father 58    MI  . Diabetes Sister   . Heart disease Sister   . Heart attack Brother   . Anesthesia problems Neg Hx   . Colon cancer Neg Hx     History   Social History  . Marital Status: Widowed    Spouse Name: N/A    Number of Children: N/A  . Years of Education: N/A   Occupational History  . Not on file.   Social History Main Topics  . Smoking status: Former Smoker    Quit date: 04/26/1970  . Smokeless tobacco: Never Used     Comment: Widows, lives alone. 2 sons. 2 daughters. Retired Health visitor  . Alcohol Use: 0.6 oz/week    1 Glasses of wine per week  . Drug Use: No  . Sexual Activity: Not on file   Other Topics Concern  . Not on file   Social History Narrative  . No narrative on file    Review of Systems:  All systems reviewed.  They are negative to the above problem except as previously stated.  Vital Signs:   Physical Exam  HEENT:  Normocephalic, atraumatic. EOMI, PERRLA.  Neck: JVP is normal.  No  bruits.  Lungs: clear to auscultation. No rales no wheezes.  Heart: Regular rate and rhythm. Normal S1, S2. No S3.   No significant murmurs. PMI not displaced.  Abdomen:  Supple, nontender. Normal bowel sounds. No masses. No hepatomegaly.  Extremities:   Good distal pulses throughout. No lower extremity edema.  Musculoskeletal :moving all extremities.  Neuro:   alert and oriented x3.  CN II-XII grossly intact.  EKG  SR  58.   Assessment and Plan:  1.  CAD.  Doing well  No symptoms of angina.  2.  HTN  Adequate control    3.  Hyperlipidemia.  Continue statin.  Very good control

## 2013-02-26 ENCOUNTER — Other Ambulatory Visit: Payer: Self-pay | Admitting: Internal Medicine

## 2013-02-28 ENCOUNTER — Encounter: Payer: Medicare Other | Attending: Internal Medicine | Admitting: Dietician

## 2013-02-28 ENCOUNTER — Encounter: Payer: Self-pay | Admitting: *Deleted

## 2013-02-28 VITALS — Ht 59.0 in | Wt 164.8 lb

## 2013-02-28 DIAGNOSIS — E119 Type 2 diabetes mellitus without complications: Secondary | ICD-10-CM | POA: Diagnosis not present

## 2013-02-28 DIAGNOSIS — Z713 Dietary counseling and surveillance: Secondary | ICD-10-CM | POA: Diagnosis not present

## 2013-02-28 NOTE — Progress Notes (Deleted)
Subjective:     Patient ID: [name], female   DOB: Nov 01, 1930, 79 y.o.   MRN: [id number]  HPI   Review of Systems     Objective:  Physical Exam     Assessment:    ***    Plan:     ***

## 2013-02-28 NOTE — Progress Notes (Signed)
  Medical Nutrition Therapy:  Appt start time: 1130 end time:  1200.  Assessment:  Primary concerns today: [name] is here today since she would like to lose weight to help with arthritis, inflammation, and manage diabetes. She would like to lose 10-15 pounds.   Preferred Learning Style:   No preference indicated   Learning Readiness:   Ready  MEDICATIONS: see list   DIETARY INTAKE:  24-hr recall:  B ( AM): blending fruits and vegetables 2 servings: (1/2 apple, 1/2 banana, handful of berries, spinach, scoop protein powder, 1/4 cup almond, water or Ensure) with boiled egg OR cereal with whole milke Snk ( AM): almond butter with crackers with Ensure sometimes L ( PM): tomato based soup with bread and cheese with tomato with coffee  Snk ( PM): protein bar D ( PM): green beans and greens or cabbage with chicken or pork chop with potatoes or sweet potatoes Snk ( PM): Ensure Beverages: 3 cups of coffee with creamer with splenda, tea at night, try to get 8 oz water  Usual physical activity: 3 x week go to the [employer] energy needs: 1400 calories 158 g carbohydrates 105 g protein 39 g fat  Progress Towards Goal(s):  In progress.   Nutritional Diagnosis:  Coqui-3.3 Overweight/obesity As related to history energy dense food choices.  As evidenced by BMI of 33.3    Intervention:  Nutrition couseling provided: Plan: Buy cabbage that is already chopped up (by the bagged lettuce). Don't have Ensure unless you are not feeling well and can't eat.  Switch to 2% milk from whole milk. Aim to get 30 minutes of exercise 5 x week.  Add vegetables to lunch and dinner if you are still hungry. (Frozen vegetables are just as good as fresh).  Have protein with carbohydrates for snacks.   Teaching Method Utilized:  Visual Auditory  Handouts given during visit include:  15 g CHO Snack  Barriers to learning/adherence to lifestyle change: none  Demonstrated degree of understanding via:   Teach Back   Monitoring/Evaluation:  Dietary intake, exercise, and body weight in 2 week(s).

## 2013-02-28 NOTE — Patient Instructions (Addendum)
Buy cabbage that is already chopped up (by the bagged lettuce). Don't have Ensure unless you are not feeling well and can't eat.  Switch to 2% milk from whole milk. Aim to get 30 minutes of exercise 5 x week.  Add vegetables to lunch and dinner if you are still hungry. (Frozen vegetables are just as good as fresh).  Have protein with carbohydrates for snacks.

## 2013-03-08 ENCOUNTER — Ambulatory Visit: Payer: Medicare Other | Admitting: Dietician

## 2013-03-22 ENCOUNTER — Ambulatory Visit: Payer: Medicare Other | Admitting: Internal Medicine

## 2013-03-22 ENCOUNTER — Encounter: Payer: Self-pay | Admitting: Family Medicine

## 2013-03-22 ENCOUNTER — Ambulatory Visit (INDEPENDENT_AMBULATORY_CARE_PROVIDER_SITE_OTHER): Payer: Medicare Other | Admitting: Family Medicine

## 2013-03-22 VITALS — BP 162/86 | HR 82 | Wt 167.0 lb

## 2013-03-22 DIAGNOSIS — M533 Sacrococcygeal disorders, not elsewhere classified: Secondary | ICD-10-CM

## 2013-03-22 NOTE — Assessment & Plan Note (Signed)
I do believe the patient probably has some sacroiliac dysfunction and osteoarthritic changes. Patient's previous x-rays have not shown anything significant at last once her from 2006. Patient was given home exercise program that I think will be beneficial. Patient was offered formal physical therapy which she declined. Patient will try this intervention as well as everything else stated in patient instructions. Patient does not improve she'll come back in 3 weeks. At that time I like to do an ultrasound-guided injection of the right sacroiliac joint.

## 2013-03-22 NOTE — Patient Instructions (Signed)
It is great to see you.  You made my day Try these exercises.  Show your trainer as well.  You have sacroilliac dysfunction.  Sacroiliac Joint Mobilization and Rehab 1. Work on pretzel stretching, shoulder back and leg draped in front. 3-5 sets, 30 sec.. 2. hip abductor rotations. standing, hip flexion and rotation outward then inward. 3 sets, 15 reps. when can do comfortably, add ankle weights starting at 2 pounds.  3. cross over stretching - shoulder back to ground, same side leg crossover. 3-5 sets for 30 min..  4. rolling up and back knees to chest and rocking. 5. sacral tilt - 5 sets, hold for 5-10 seconds  Come back 3 weeks.

## 2013-03-22 NOTE — Progress Notes (Signed)
CC: Right hip and back pain follow up  HPI: Patient is a very pleasant 79 year old female who is coming in with right hip pain and back pain follow up.  Patient was seen previously and had almost near complete resolution of pain. Patient states that she is starting to have some pain again they'll mostly being that back this time. Patient states that this seems to be right-sided and very localized. Patient points to right above hurts sacroiliac joint. Patient states it hurts with any type of movement at this time. Patient rates the pain a 2/10. States it is more of a burning sensation. Patient feels that something is potentially grinding. Patient denies any significant radiation but states that the lateral aspect of her hip is starting to hurt more secondary to this pain.   Patient does have a past medical history significant for diabetes, and multiple surgeries including bilateral knee replacement, and bilateral reverse shoulder arthroplasty. Patient does have known osteoarthritis of the hips bilaterally as well as her back. Patient does state that she did have a microdiscectomy x2 multiple years ago. Patient is also over the course of the last couple years undergone epidural steroid injections.    Past medical, surgical, family and social history reviewed. Medications reviewed all in the electronic medical record.   Review of Systems: No headache, visual changes, nausea, vomiting, diarrhea, constipation, dizziness, abdominal pain, skin rash, fevers, chills, night sweats, weight loss, swollen lymph nodes, body aches, joint swelling, muscle aches, chest pain, shortness of breath, mood changes.   Objective:    Blood pressure 162/86, pulse 82, weight 167 lb (75.751 kg), SpO2 98.00%.   General: No apparent distress alert and oriented x3 mood and affect normal, dressed appropriately.  HEENT: Pupils equal, extraocular movements intact Respiratory: Patient's speak in full sentences and does not appear  short of breath Cardiovascular: No lower extremity edema, non tender, no erythema Skin: Warm dry intact with no signs of infection or rash on extremities or on axial skeleton. Abdomen: Soft nontender Neuro: Cranial nerves II through XII are intact, neurovascularly intact in all extremities with 2+ DTRs and 2+ pulses. Lymph: No lymphadenopathy of posterior or anterior cervical chain or axillae bilaterally.  Gait antalgic ambulating with a cane MSK: Non tender with full range of motion and good stability and symmetric strength and tone of shoulders, elbows, wrist,  knee and ankles bilaterally.  Back Exam:  Inspection: Unremarkable  Motion: Flexion 45 deg, Extension 25 deg, Side Bending to 25 deg bilaterally,  Rotation to 45 deg bilaterally  SLR laying: Negative  XSLR laying: Negative  Palpable tenderness: Bilateral paraspinal musculature of the lumbar spine no spinous process tenderness. Significant pain over the right SI joint FABER: Positive on right but difficult to do secondary to an bilateral knee replacements Sensory change: Gross sensation intact to all lumbar and sacral dermatomes.  Reflexes: 2+ at both patellar tendons, 2+ at achilles tendons, Babinski's downgoing.  Strength at foot  Plantar-flexion: 5/5 Dorsi-flexion: 5/5 Eversion: 5/5 Inversion: 5/5  Leg strength  Quad: 5/5 Hamstring: 5/5 Hip flexor: 5/5 Hip abductors: 5/5  Hip: Right ROM IR: 15 Deg, with mild groin ER: 35 Deg, Flexion: 100 Deg, Extension: 100 Deg, Abduction: 45 Deg, Adduction: 45 Deg Strength IR: 5/5, ER: 5/5, Flexion: 5/5, Extension: 5/5, Abduction: 5/5, Adduction: 5/5 Pelvic alignment unremarkable to inspection and palpation. Greater trochanter nontender No tenderness over piriformis  Severe SI joint tenderness and normal  SI movement.     Impression and Recommendations:  This case required medical decision making of moderate complexity.

## 2013-03-22 NOTE — Progress Notes (Signed)
Pre-visit discussion using our clinic review tool. No additional management support is needed unless otherwise documented below in the visit note.  

## 2013-03-23 ENCOUNTER — Other Ambulatory Visit: Payer: Self-pay | Admitting: Family Medicine

## 2013-03-24 ENCOUNTER — Ambulatory Visit (INDEPENDENT_AMBULATORY_CARE_PROVIDER_SITE_OTHER): Payer: Medicare Other | Admitting: Internal Medicine

## 2013-03-24 ENCOUNTER — Encounter: Payer: Self-pay | Admitting: Internal Medicine

## 2013-03-24 VITALS — BP 142/80 | HR 66 | Temp 98.8°F | Wt 162.8 lb

## 2013-03-24 DIAGNOSIS — I10 Essential (primary) hypertension: Secondary | ICD-10-CM | POA: Diagnosis not present

## 2013-03-24 DIAGNOSIS — IMO0001 Reserved for inherently not codable concepts without codable children: Secondary | ICD-10-CM | POA: Diagnosis not present

## 2013-03-24 DIAGNOSIS — E119 Type 2 diabetes mellitus without complications: Secondary | ICD-10-CM

## 2013-03-24 MED ORDER — POLYETHYLENE GLYCOL 3350 17 G PO PACK: 17.0000 g | PACK | Freq: Two times a day (BID) | ORAL | Status: DC

## 2013-03-24 NOTE — Assessment & Plan Note (Signed)
Diffuse arthritic pains complicated by myalgias Takes daily low-dose meloxicam for same Supportive care and physical therapy as ongoing Working with sports med Katrinka Blazing) - interval hx reviewed

## 2013-03-24 NOTE — Progress Notes (Signed)
Pre-visit discussion using our clinic review tool. No additional management support is needed unless otherwise documented below in the visit note.  

## 2013-03-24 NOTE — Assessment & Plan Note (Signed)
Fair control - continue ongoing medical therapy as ongoing  BP Readings from Last 3 Encounters:  03/24/13 142/80  03/22/13 162/86  02/13/13 140/74

## 2013-03-24 NOTE — Patient Instructions (Addendum)
It was good to see you today.  We have reviewed your prior records including labs and tests today  Medications reviewed and updated, increase Miralax powder to 2x/day No other changes  Keep working with Dr Katrinka Blazing as ongoing  Please schedule followup in 6 months, call sooner if problems.   Diabetes and Standards of Medical Care  Diabetes is complicated. You may find that your diabetes team includes a dietitian, nurse, diabetes educator, eye doctor, and more. To help everyone know what is going on and to help you get the care you deserve, the following schedule of care was developed to help keep you on track. Below are the tests, exams, vaccines, medicines, education, and plans you will need. HbA1c test This test shows how well you have controlled your glucose over the past 2 3 months. It is used to see if your diabetes management plan needs to be adjusted.   It is performed at least 2 times a year if you are meeting treatment goals.  It is performed 4 times a year if therapy has changed or if you are not meeting treatment goals. Blood pressure test  This test is performed at every routine medical visit. The goal is less than 140/90 mmHg for most people, but 130/80 mmHg in some cases. Ask your health care provider about your goal. Dental exam  Follow up with the dentist regularly. Eye exam  If you are diagnosed with type 1 diabetes as a child, get an exam upon reaching the age of 10 years or older and have had diabetes for 3 5 years. Yearly eye exams are recommended after that initial eye exam.  If you are diagnosed with type 1 diabetes as an adult, get an exam within 5 years of diagnosis and then yearly.  If you are diagnosed with type 2 diabetes, get an exam as soon as possible after the diagnosis and then yearly. Foot care exam  Visual foot exams are performed at every routine medical visit. The exams check for cuts, injuries, or other problems with the feet.  A comprehensive  foot exam should be done yearly. This includes visual inspection as well as assessing foot pulses and testing for loss of sensation.  Check your feet nightly for cuts, injuries, or other problems with your feet. Tell your health care provider if anything is not healing. Kidney function test (urine microalbumin)  This test is performed once a year.  Type 1 diabetes: The first test is performed 5 years after diagnosis.  Type 2 diabetes: The first test is performed at the time of diagnosis.  A serum creatinine and estimated glomerular filtration rate (eGFR) test is done once a year to assess the level of chronic kidney disease (CKD), if present. Lipid profile (cholesterol, HDL, LDL, triglycerides)  Performed every 5 years for most people.  The goal for LDL is less than 100 mg/dL. If you are at high risk, the goal is less than 70 mg/dL.  The goal for HDL is 40 mg/dL 50 mg/dL for men and 50 mg/dL 60 mg/dL for women. An HDL cholesterol of 60 mg/dL or higher gives some protection against heart disease.  The goal for triglycerides is less than 150 mg/dL. Influenza vaccine, pneumococcal vaccine, and hepatitis B vaccine  The influenza vaccine is recommended yearly.  The pneumococcal vaccine is generally given once in a lifetime. However, there are some instances when another vaccination is recommended. Check with your health care provider.  The hepatitis B vaccine is also recommended  for adults with diabetes. Diabetes self-management education  Education is recommended at diagnosis and ongoing as needed. Treatment plan  Your treatment plan is reviewed at every medical visit. Document Released: 02/01/2009 Document Revised: 12/07/2012 Document Reviewed: 09/06/2012 [facility] Patient Information 2014 [place], [place].

## 2013-03-24 NOTE — Assessment & Plan Note (Signed)
On metformin - The current medical regimen (metformin) is effective;  continue present plan and medications. Check a1c q39mo -adjust as needed Lab Results  Component Value Date   HGBA1C 6.0 12/22/2012

## 2013-03-24 NOTE — Progress Notes (Signed)
  Subjective:    Patient ID: [name], female    DOB: 04-12-1931, 79 y.o.   MRN: [id number]  HPI  Here for follow up - reviewed chronic medical issues and interval medical events   Past Medical History  Diagnosis Date  . S/P CABG x 1   . S/P inguinal hernia repair   . TUBULOVILLOUS ADENOMA, 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    colo 04/2012 - no polyps - no further screening planned (age >59)  . CAD, NATIVE VESSEL 06/09/2008  . UNSPECIFIED ANEMIA   . DIABETES MELLITUS, TYPE II   . GERD   . HYPERLIPIDEMIA   . HYPERTENSION   . HIATAL HERNIA   . FIBROMYALGIA   . OA (osteoarthritis)     severe, shoulders, hands - inflammatory, ?RA  . Gout   . Insomnia     Review of Systems  Constitutional: Negative for fever and fatigue.  Respiratory: Negative for cough and shortness of breath.   Cardiovascular: Negative for chest pain and leg swelling.  Musculoskeletal: Positive for back pain and myalgias. Negative for joint swelling.       Objective:   Physical Exam BP 142/80  Pulse 66  Temp(Src) 98.8 F (37.1 C) (Oral)  Wt 162 lb 12.8 oz (73.846 kg)  SpO2 97% Wt Readings from Last 3 Encounters:  03/24/13 162 lb 12.8 oz (73.846 kg)  03/22/13 167 lb (75.751 kg)  02/28/13 164 lb 12.8 oz (74.753 kg)   Constitutional: She appears well-developed and well-nourished. No distress.  Neck: Normal range of motion. Neck supple. No JVD present. No thyromegaly present.  Cardiovascular: Normal rate, regular rhythm and normal heart sounds.  No murmur heard. No BLE edema. Pulmonary/Chest: Effort normal and breath sounds normal. No respiratory distress. She has no wheezes.  MSkel: no synovitis  Skin: Skin is warm and dry. No rash noted. No erythema.  Psychiatric: She has a normal mood and affect. Her behavior is normal. Judgment and thought content normal.   Lab Results  Component Value Date   WBC 5.9 07/06/2012   HGB 12.9 07/06/2012   HCT 39.1 07/06/2012   PLT 184.0 07/06/2012   GLUCOSE 102* 07/06/2012   CHOL 182 12/22/2012   TRIG 51.0 12/22/2012   HDL 97.50 12/22/2012   LDLDIRECT 115.3 09/30/2009   LDLCALC 74 12/22/2012   ALT 20 07/06/2012   AST 27 07/06/2012   NA 139 07/06/2012   K 4.0 07/06/2012   CL 105 07/06/2012   CREATININE 0.9 07/06/2012   BUN 12 07/06/2012   CO2 26 07/06/2012   TSH 0.92 07/06/2012   INR 0.99 11/06/2010   HGBA1C 6.0 12/22/2012   MICROALBUR 3.4* 12/22/2012        Assessment & Plan:   See problem list. Medications and labs reviewed today.  Time spent with pt today 25 minutes, greater than 50% time spent counseling patient on diabetes, arthralgias and medication review. Also review of prior records

## 2013-03-30 ENCOUNTER — Encounter: Payer: Medicare Other | Attending: Internal Medicine | Admitting: Dietician

## 2013-03-30 DIAGNOSIS — Z713 Dietary counseling and surveillance: Secondary | ICD-10-CM | POA: Insufficient documentation

## 2013-03-30 DIAGNOSIS — E119 Type 2 diabetes mellitus without complications: Secondary | ICD-10-CM | POA: Insufficient documentation

## 2013-03-30 NOTE — Patient Instructions (Addendum)
Buy cabbage (coleslaw mix) that is already chopped up (by the bagged lettuce). Continue drinking 2% milk instead of whole.  Aim to get 30 minutes of exercise 5 x week.  Add vegetables to lunch and dinner if you are still hungry. (Frozen vegetables are just as good as fresh).  Have protein with carbohydrates for snacks.  Work on drinking water. Leave a glass of water by your bed have a full glass first thing in the morning.

## 2013-03-30 NOTE — Progress Notes (Signed)
  Medical Nutrition Therapy:  Appt start time: 300 end time:  345.  Assessment:  Primary concerns today: [name] is here today for a follow up for weight loss. Lost about 1 pound the last visit. Has had trouble with constipation which is causing her pain in back and legs in the morning.     Wt Readings from Last 3 Encounters:  03/30/13 163 lb 9.6 oz (74.208 kg)  03/24/13 162 lb 12.8 oz (73.846 kg)  03/22/13 167 lb (75.751 kg)   Ht Readings from Last 3 Encounters:  03/30/13 4\' 11"  (1.499 m)  02/28/13 4\' 11"  (1.499 m)  02/13/13 4\' 10"  (1.473 m)   Body mass index is 33.03 kg/(m^2). @BMIFA @ Normalized weight-for-age data available only for age 69 to 20 years. Normalized stature-for-age data available only for age 69 to 20 years.   Preferred Learning Style:   No preference indicated   Learning Readiness:   Ready  MEDICATIONS: see list   DIETARY INTAKE:  24-hr recall:  B ( AM): blending fruits and vegetables 2 servings: (1/2 apple, 1/2 banana, handful of berries, spinach, scoop protein powder, 1/4 cup almond, water or Ensure) with boiled egg OR cereal (Raisin Bran, Fiber One with brain flakes or Cheerios with almonds) with 1 or 2% milk Snk ( AM): almond butter with crackers  L ( PM): tomato based soup with bread and cheese with tomato with coffee  Snk ( PM): protein bar D ( PM): green beans and greens or cabbage with chicken or pork chop with potatoes or sweet potatoes or salad with meat Snk ( PM): banana with tea or ginger snaps  Beverages: 3 cups of coffee with creamer with splenda, tea at night, try to get 8 oz water  Usual physical activity: not able to exercise as much as before, but went to [employer] 1 x week   Estimated energy needs: 1400 calories 158 g carbohydrates 105 g protein 39 g fat  Progress Towards Goal(s):  In progress.   Nutritional Diagnosis:  [place]-3.3 Overweight/obesity As related to history energy dense food choices.  As evidenced by BMI of 33.3     Intervention:  Nutrition couseling provided: Plan: Buy cabbage (coleslaw mix) that is already chopped up (by the bagged lettuce). Continue drinking 2% milk instead of whole.  Aim to get 30 minutes of exercise 5 x week.  Add vegetables to lunch and dinner if you are still hungry. (Frozen vegetables are just as good as fresh).  Have protein with carbohydrates for snacks.  Work on drinking water. Leave a glass of water by your bed have a full glass first thing in the morning.   Teaching Method Utilized:  Visual Auditory  Handouts given during visit include:  15 g CHO Snack  Barriers to learning/adherence to lifestyle change: none  Demonstrated degree of understanding via:  Teach Back   Monitoring/Evaluation:  Dietary intake, exercise, and body weight in 4 week(s).

## 2013-04-06 ENCOUNTER — Ambulatory Visit (INDEPENDENT_AMBULATORY_CARE_PROVIDER_SITE_OTHER): Payer: Medicare Other | Admitting: Family Medicine

## 2013-04-06 ENCOUNTER — Encounter: Payer: Self-pay | Admitting: Family Medicine

## 2013-04-06 VITALS — BP 148/82 | HR 60

## 2013-04-06 DIAGNOSIS — G8929 Other chronic pain: Secondary | ICD-10-CM | POA: Diagnosis not present

## 2013-04-06 DIAGNOSIS — M545 Low back pain, unspecified: Secondary | ICD-10-CM | POA: Diagnosis not present

## 2013-04-06 DIAGNOSIS — M533 Sacrococcygeal disorders, not elsewhere classified: Secondary | ICD-10-CM

## 2013-04-06 NOTE — Patient Instructions (Signed)
Good to see you Try the four P- Pears, prunes, plums and peaches.  Continue the exercises We will get MRI to look to see if anything has changed.  Come back again in 3 weeks or after you get MRI make an appointment and we will go over it.

## 2013-04-06 NOTE — Assessment & Plan Note (Signed)
Patient continues to have chronic low back pain. We did discuss that changes see if constipation is potentially having a template. Patient though is starting to have more radicular symptoms down the legs with some weakness which makes the concern for potential spinal stenosis. I do feel an MRI would be beneficial. Patient does have a history of 2 micro-discectomies previously. No effusion noted. I would like patient to get this MRI and we will evaluate further. In the interim she'll continue the same medications and exercises.

## 2013-04-06 NOTE — Assessment & Plan Note (Signed)
Injected today patient tolerated the procedure very well and did have good resolution of pain. Encourage her to continue the exercises. We'll get an MRI of the lumbar spine to rule out spinal stenosis. Patient likely had her SI joints looked as well. Patient come back after the MRI to discuss further treatment options.

## 2013-04-06 NOTE — Progress Notes (Signed)
CC: Worsening right back pain  HPI: Patient is a very pleasant 79 year old female with a history of 2 microdiscectomy his multiple years ago coming in with worsening pain. Patient has been constipated recently and states that this seems to be somewhat associated with it. Patient also states that it seems that her pain is becoming more centralized her back and seems to be somewhat radiating down both legs. Patient states that she is feeling more tired with walking as well. Patient still is able to localize over the right SI joint being the most critical pain but now having more diffuse pain over the lumbar spine. Patient denies ever feeling like she is going to pass out or falls secondary to weakness in her legs but states that it's really more difficult to even stand such as at church. Patient denies any other bowel or bladder changes except for the constipation. Patient is having this done with with her primary care provider.   Patient does have a past medical history significant for diabetes, and multiple surgeries including bilateral knee replacement, and bilateral reverse shoulder arthroplasty. Patient does have known osteoarthritis of the hips bilaterally as well as her back. Patient does state that she did have a microdiscectomy x2 multiple years ago. Patient is also over the course of the last couple years undergone epidural steroid injections.    Past medical, surgical, family and social history reviewed. Medications reviewed all in the electronic medical record.   Review of Systems: No headache, visual changes, nausea, vomiting, diarrhea, constipation, dizziness, abdominal pain, skin rash, fevers, chills, night sweats, weight loss, swollen lymph nodes, body aches, joint swelling, muscle aches, chest pain, shortness of breath, mood changes.   Objective:    Blood pressure 148/82, pulse 60, SpO2 98.00%.   General: No apparent distress alert and oriented x3 mood and affect normal, dressed  appropriately.  HEENT: Pupils equal, extraocular movements intact Respiratory: Patient's speak in full sentences and does not appear short of breath Cardiovascular: No lower extremity edema, non tender, no erythema Skin: Warm dry intact with no signs of infection or rash on extremities or on axial skeleton. Abdomen: Soft nontender Neuro: Cranial nerves II through XII are intact, neurovascularly intact in all extremities with 2+ DTRs and 2+ pulses. Lymph: No lymphadenopathy of posterior or anterior cervical chain or axillae bilaterally.  Gait antalgic ambulating with a cane MSK: Non tender with full range of motion and good stability and symmetric strength and tone of shoulders, elbows, wrist,  knee and ankles bilaterally.  Back Exam:  Inspection: Unremarkable  Motion: Flexion 45 deg, Extension 25 deg, Side Bending to 25 deg bilaterally,  Rotation to 45 deg bilaterally  SLR laying: Negative  XSLR laying: Negative  Palpable tenderness: Bilateral paraspinal musculature of the lumbar spine no spinous process tenderness. Significant pain over the right SI joint FABER: Positive on right but difficult to do secondary to an bilateral knee replacements Sensory change: Gross sensation intact to all lumbar and sacral dermatomes.  Reflexes: 2+ at both patellar tendons, 2+ at achilles tendons, Babinski's downgoing.  Strength at foot  Plantar-flexion: 5/5 Dorsi-flexion: 5/5 Eversion: 5/5 Inversion: 5/5  Leg strength  Quad: 5/5 Hamstring: 5/5 Hip flexor: 5/5 Hip abductors: 5/5  Hip: Right ROM IR: 15 Deg, with mild groin ER: 35 Deg, Flexion: 100 Deg, Extension: 100 Deg, Abduction: 45 Deg, Adduction: 45 Deg Strength IR: 5/5, ER: 5/5, Flexion: 5/5, Extension: 5/5, Abduction: 5/5, Adduction: 5/5 Pelvic alignment unremarkable to inspection and palpation. Greater trochanter nontender No tenderness  over piriformis  Severe SI joint tenderness and normal  SI movement.  After verbal consent patient was  prepped with 2 alcohol swabs and with a 23-gauge 3 inch needle was injected into the right SI joint under ultrasound guidance with 1 cc of 0.5% Marcaine and 1 cc of Kenalog 40 mg/dL. Patient did have good resolution of pain.   Impression and Recommendations:     This case required medical decision making of moderate complexity.

## 2013-04-06 NOTE — Progress Notes (Signed)
Pre-visit discussion using our clinic review tool. No additional management support is needed unless otherwise documented below in the visit note.  

## 2013-04-11 ENCOUNTER — Ambulatory Visit: Payer: Medicare Other | Admitting: Family Medicine

## 2013-04-19 ENCOUNTER — Ambulatory Visit
Admission: RE | Admit: 2013-04-19 | Discharge: 2013-04-19 | Disposition: A | Discharge status: Home / Self Care | Payer: Medicare Other | Source: Ambulatory Visit | Attending: Family Medicine | Admitting: Family Medicine

## 2013-04-19 DIAGNOSIS — M545 Low back pain, unspecified: Secondary | ICD-10-CM

## 2013-04-19 DIAGNOSIS — M47817 Spondylosis without myelopathy or radiculopathy, lumbosacral region: Secondary | ICD-10-CM | POA: Diagnosis not present

## 2013-04-19 DIAGNOSIS — G8929 Other chronic pain: Secondary | ICD-10-CM

## 2013-04-19 DIAGNOSIS — M48061 Spinal stenosis, lumbar region without neurogenic claudication: Secondary | ICD-10-CM | POA: Diagnosis not present

## 2013-04-19 DIAGNOSIS — M5126 Other intervertebral disc displacement, lumbar region: Secondary | ICD-10-CM | POA: Diagnosis not present

## 2013-04-22 ENCOUNTER — Other Ambulatory Visit: Payer: Self-pay | Admitting: Family Medicine

## 2013-04-24 ENCOUNTER — Ambulatory Visit (INDEPENDENT_AMBULATORY_CARE_PROVIDER_SITE_OTHER): Payer: Medicare Other | Admitting: Family Medicine

## 2013-04-24 ENCOUNTER — Encounter: Payer: Self-pay | Admitting: Family Medicine

## 2013-04-24 VITALS — BP 132/78 | HR 68

## 2013-04-24 DIAGNOSIS — M48061 Spinal stenosis, lumbar region without neurogenic claudication: Secondary | ICD-10-CM

## 2013-04-24 DIAGNOSIS — G8929 Other chronic pain: Secondary | ICD-10-CM

## 2013-04-24 DIAGNOSIS — M545 Low back pain, unspecified: Secondary | ICD-10-CM

## 2013-04-24 DIAGNOSIS — M4716 Other spondylosis with myelopathy, lumbar region: Secondary | ICD-10-CM | POA: Insufficient documentation

## 2013-04-24 NOTE — Progress Notes (Signed)
Pre-visit discussion using our clinic review tool. No additional management support is needed unless otherwise documented below in the visit note.  

## 2013-04-24 NOTE — Assessment & Plan Note (Signed)
Patient does have spinal stenosis at multiple levels as well as spondylolisthesis.  Patient will start formal physical therapy for balance and coordination and see if any strengthening can help her leg. Patient knows if she has any worsening weakness or more numbness she will call immediately. Patient has had epidurals in the past and would consider doing them again if necessary but would like to hold for now. She will continue the Neurontin. Patient will come back again in 4 weeks. Spent greater than 25 minutes with patient face-to-face and had greater than 50% of counseling including as described above in assessment and plan.

## 2013-04-24 NOTE — Patient Instructions (Signed)
We will send you to physical therapy for balance and coordination with your spinal stenosis.  Continue the exercises 3 times a week If you have worsening pain or weakness or numbness please call or come in and we will schedule an epidural injection to try.  Continue the medicines including the gabapentin.  Come back in 4 weeks after the physical therapy has been doing well.

## 2013-04-24 NOTE — Progress Notes (Signed)
CC: Followup back pain  HPI: Patient is a very pleasant 80 year old female with a history of 2 microdiscectomy his multiple years ago coming in with followup of low back pain. Patient does have an exacerbation we did get an MRI and she is here to go over the results. Patient states since that time she's been doing the exercises and has been doing fairly well. Patient does have some mild weakness of the legs bilaterally. She does not know if this is from her back or from her bilateral knee replacements. Patient denies any numbness but from time to time on her left posterior aspect of her leg she can time to have some feelings of needle pricking.  MRI was reviewed by me today. MRI do show that she has anterior listhesis of L4 on L5. In addition to this patient does have spinal stenosis at the L4-L5 as well as L5-S1 regions.  Patient does have a past medical history significant for diabetes, and multiple surgeries including bilateral knee replacement, and bilateral reverse shoulder arthroplasty. Patient does have known osteoarthritis of the hips bilaterally as well as her back. Patient does state that she did have a microdiscectomy x2 multiple years ago. Patient is also over the course of the last couple years undergone epidural steroid injections.    Past medical, surgical, family and social history reviewed. Medications reviewed all in the electronic medical record.   Review of Systems: No headache, visual changes, nausea, vomiting, diarrhea, constipation, dizziness, abdominal pain, skin rash, fevers, chills, night sweats, weight loss, swollen lymph nodes, body aches, joint swelling, muscle aches, chest pain, shortness of breath, mood changes.   Objective:    Blood pressure 132/78, pulse 68, SpO2 99.00%.   General: No apparent distress alert and oriented x3 mood and affect normal, dressed appropriately.  HEENT: Pupils equal, extraocular movements intact Respiratory: Patient's speak in full  sentences and does not appear short of breath Cardiovascular: No lower extremity edema, non tender, no erythema Skin: Warm dry intact with no signs of infection or rash on extremities or on axial skeleton. Abdomen: Soft nontender Neuro: Cranial nerves II through XII are intact, neurovascularly intact in all extremities with 2+ DTRs and 2+ pulses. Lymph: No lymphadenopathy of posterior or anterior cervical chain or axillae bilaterally.  Gait antalgic ambulating with a cane MSK: Non tender with full range of motion and good stability and symmetric strength and tone of shoulders, elbows, wrist,  knee and ankles bilaterally.  Back Exam:  Inspection: Unremarkable  Motion: Flexion 45 deg, Extension 25 deg, Side Bending to 25 deg bilaterally,  Rotation to 45 deg bilaterally  SLR laying: Negative  XSLR laying: Negative  Palpable tenderness: Bilateral paraspinal musculature of the lumbar spine no spinous process tenderness. Significant pain over the right SI joint FABER: Positive on right but difficult to do secondary to an bilateral knee replacements Sensory change: Gross sensation intact to all lumbar and sacral dermatomes.  Reflexes: 2+ at both patellar tendons, 2+ at achilles tendons, Babinski's downgoing.  Strength at foot  Plantar-flexion: 5/5 Dorsi-flexion: 5/5 Eversion: 5/5 Inversion: 5/5  Leg strength  Quad: 5/5 Hamstring: 5/5 Hip flexor: 5/5 Hip abductors: 5/5  Hip: Right ROM IR: 15 Deg, with mild groin ER: 35 Deg, Flexion: 100 Deg, Extension: 100 Deg, Abduction: 45 Deg, Adduction: 45 Deg Strength IR: 5/5, ER: 5/5, Flexion: 5/5, Extension: 5/5, Abduction: 5/5, Adduction: 5/5 Pelvic alignment unremarkable to inspection and palpation. Greater trochanter nontender No tenderness over piriformis    Impression and Recommendations:  This case required medical decision making of moderate complexity.

## 2013-04-27 ENCOUNTER — Encounter: Payer: Medicare Other | Attending: Internal Medicine | Admitting: Dietician

## 2013-04-27 VITALS — Ht 59.0 in | Wt 162.0 lb

## 2013-04-27 DIAGNOSIS — K5900 Constipation, unspecified: Secondary | ICD-10-CM | POA: Insufficient documentation

## 2013-04-27 DIAGNOSIS — Z713 Dietary counseling and surveillance: Secondary | ICD-10-CM | POA: Insufficient documentation

## 2013-04-27 DIAGNOSIS — Z6833 Body mass index (BMI) 33.0-33.9, adult: Secondary | ICD-10-CM | POA: Diagnosis not present

## 2013-04-27 DIAGNOSIS — E669 Obesity, unspecified: Secondary | ICD-10-CM | POA: Insufficient documentation

## 2013-04-27 NOTE — Progress Notes (Signed)
  Medical Nutrition Therapy:  Appt start time: 330 end time:  415.  Assessment:  Primary concerns today: [name] is here today for a follow up for weight loss. Lost one lb since last visit. Still having problems with constipation. Drinking a lot more water than before. Adding more vegetables to have a lunch and dinner.   Wt Readings from Last 3 Encounters:  04/27/13 162 lb (73.483 kg)  03/30/13 163 lb 9.6 oz (74.208 kg)  03/24/13 162 lb 12.8 oz (73.846 kg)   Ht Readings from Last 3 Encounters:  04/27/13 4\' 11"  (1.499 m)  03/30/13 4\' 11"  (1.499 m)  02/28/13 4\' 11"  (1.499 m)   Body mass index is 32.7 kg/(m^2). @BMIFA @ Normalized weight-for-age data available only for age 96 to 16 years. Normalized stature-for-age data available only for age 96 to 22 years.  Preferred Learning Style:   No preference indicated   Learning Readiness:   Ready  MEDICATIONS: see list   DIETARY INTAKE:  24-hr recall:  B ( AM): blending fruits and vegetables 2 servings: (1/2 apple, 1/2 banana, handful of berries, spinach, scoop protein powder, 1/4 cup almond, water) with boiled egg OR cereal (Raisin Bran, Fiber One with brain flakes or Cheerios with almonds) with 1 or 2% milk Snk ( AM): almond butter with crackers  L ( PM): tomato based soup with bread and cheese with tomato with coffee  Snk ( PM): protein bar D ( PM): green beans and greens or cabbage with chicken or pork chop with potatoes or sweet potatoes or salad with meat Snk ( PM): banana with tea or ginger snaps  Beverages: 3 cups of coffee with creamer with honey and splenda, tea at night, 2 during the night 8 oz water, and 4 x 8 oz   Usual physical activity: [employer] 3 x week   Estimated energy needs: 1400 calories 158 g carbohydrates 105 g protein 39 g fat  Progress Towards Goal(s):  In progress.   Nutritional Diagnosis:  [place]-3.3 Overweight/obesity As related to history energy dense food choices.  As evidenced by BMI of 33.3     Intervention:  Nutrition couseling provided:  Plan: Look for cabbage (coleslaw mix) that is already chopped up (by the bagged lettuce). Consider drinking 1% milk instead 2%.   Continue to get 30 minutes of exercise 3 x week.  Add vegetables to lunch and dinner if you are still hungry. (Frozen vegetables are just as good as fresh).  Continue to have protein with carbohydrates for snacks.  Bake carrots and other vegetables in the oven with oil.  Continue drinking more water.    Teaching Method Utilized:  Visual Auditory  Barriers to learning/adherence to lifestyle change: none  Demonstrated degree of understanding via:  Teach Back   Monitoring/Evaluation:  Dietary intake, exercise, and body weight in 1 month(s).

## 2013-04-27 NOTE — Patient Instructions (Addendum)
Look for cabbage (coleslaw mix) that is already chopped up (by the bagged lettuce). Consider drinking 1% milk instead 2%.   Continue to get 30 minutes of exercise 3 x week.  Add vegetables to lunch and dinner if you are still hungry. (Frozen vegetables are just as good as fresh).  Continue to have protein with carbohydrates for snacks.  Bake carrots and other vegetables in the oven with oil.  Continue drinking more water.

## 2013-05-01 ENCOUNTER — Ambulatory Visit: Payer: Medicare Other | Attending: Family Medicine

## 2013-05-01 DIAGNOSIS — Z96619 Presence of unspecified artificial shoulder joint: Secondary | ICD-10-CM | POA: Insufficient documentation

## 2013-05-01 DIAGNOSIS — M5380 Other specified dorsopathies, site unspecified: Secondary | ICD-10-CM | POA: Diagnosis not present

## 2013-05-01 DIAGNOSIS — E119 Type 2 diabetes mellitus without complications: Secondary | ICD-10-CM | POA: Insufficient documentation

## 2013-05-01 DIAGNOSIS — R5381 Other malaise: Secondary | ICD-10-CM | POA: Insufficient documentation

## 2013-05-01 DIAGNOSIS — M545 Low back pain, unspecified: Secondary | ICD-10-CM | POA: Diagnosis not present

## 2013-05-01 DIAGNOSIS — IMO0001 Reserved for inherently not codable concepts without codable children: Secondary | ICD-10-CM | POA: Diagnosis not present

## 2013-05-01 DIAGNOSIS — Z951 Presence of aortocoronary bypass graft: Secondary | ICD-10-CM | POA: Insufficient documentation

## 2013-05-01 DIAGNOSIS — Z96659 Presence of unspecified artificial knee joint: Secondary | ICD-10-CM | POA: Diagnosis not present

## 2013-05-01 DIAGNOSIS — I10 Essential (primary) hypertension: Secondary | ICD-10-CM | POA: Insufficient documentation

## 2013-05-03 ENCOUNTER — Ambulatory Visit: Payer: Medicare Other | Admitting: Physical Therapy

## 2013-05-03 DIAGNOSIS — I10 Essential (primary) hypertension: Secondary | ICD-10-CM | POA: Diagnosis not present

## 2013-05-03 DIAGNOSIS — E119 Type 2 diabetes mellitus without complications: Secondary | ICD-10-CM | POA: Diagnosis not present

## 2013-05-03 DIAGNOSIS — IMO0001 Reserved for inherently not codable concepts without codable children: Secondary | ICD-10-CM | POA: Diagnosis not present

## 2013-05-03 DIAGNOSIS — M5380 Other specified dorsopathies, site unspecified: Secondary | ICD-10-CM | POA: Diagnosis not present

## 2013-05-03 DIAGNOSIS — R5381 Other malaise: Secondary | ICD-10-CM | POA: Diagnosis not present

## 2013-05-03 DIAGNOSIS — M545 Low back pain, unspecified: Secondary | ICD-10-CM | POA: Diagnosis not present

## 2013-05-04 ENCOUNTER — Ambulatory Visit: Payer: Medicare Other

## 2013-05-08 ENCOUNTER — Ambulatory Visit (INDEPENDENT_AMBULATORY_CARE_PROVIDER_SITE_OTHER): Payer: Medicare Other | Admitting: Family Medicine

## 2013-05-08 ENCOUNTER — Encounter: Payer: Self-pay | Admitting: Family Medicine

## 2013-05-08 VITALS — BP 128/76 | HR 85 | Temp 98.3°F | Resp 16 | Wt 161.1 lb

## 2013-05-08 DIAGNOSIS — M545 Low back pain, unspecified: Secondary | ICD-10-CM | POA: Diagnosis not present

## 2013-05-08 DIAGNOSIS — G8929 Other chronic pain: Secondary | ICD-10-CM | POA: Diagnosis not present

## 2013-05-08 DIAGNOSIS — M48061 Spinal stenosis, lumbar region without neurogenic claudication: Secondary | ICD-10-CM

## 2013-05-08 MED ORDER — GABAPENTIN 100 MG PO CAPS: 200.0000 mg | ORAL_CAPSULE | Freq: Every day | ORAL | Status: DC

## 2013-05-08 NOTE — Assessment & Plan Note (Signed)
Patient does have spinal stenosis at L4-L5 and L5-S1. Patient has exhausted most conservative therapy but will continue with physical therapy for now. In addition this patient will also increase her gabapentin to 200 mg at night. Patient will be scheduled for an epidural steroid injection to see if this gives her some improvement. The patient to follow up 2 weeks after injection for further evaluation.

## 2013-05-08 NOTE — Patient Instructions (Signed)
It is always wonderful to see you Try the epidural.  They will call you with the appointment.  Increase gabapentin to 2 pills at night.  Continue physical therapy  Come back 2 weeks after you have the epidural to tell me how you are doing.

## 2013-05-08 NOTE — Progress Notes (Signed)
CC: Followup back pain  HPI: Patient is a very pleasant 80 year old female with a history of 2 microdiscectomy his multiple years ago coming in with followup of low back pain. Patient was to start formal physical therapy after her last visit. Patient was to follow up in 4 weeks but is coming back early secondary to having worsening pain at night. Patient states his pain is so severe that it is waking her up at 1 AM. Patient states that she has significant tightness in the morning and it takes quite some time to loosen up with heating pads before she can do any activity. Patient has missed church the last week and secondary to the pain. No new symptoms do seem to be worsening.   MRI was reviewed by me today. MRI do show that she has anterior listhesis of L4 on L5. In addition to this patient does have spinal stenosis at the L4-L5 as well as L5-S1 regions.  Patient does have a past medical history significant for diabetes, and multiple surgeries including bilateral knee replacement, and bilateral reverse shoulder arthroplasty. Patient does have known osteoarthritis of the hips bilaterally as well as her back. Patient does state that she did have a microdiscectomy x2 multiple years ago. Patient is also over the course of the last couple years undergone epidural steroid injections.    Past medical, surgical, family and social history reviewed. Medications reviewed all in the electronic medical record.   Review of Systems: No headache, visual changes, nausea, vomiting, diarrhea, constipation, dizziness, abdominal pain, skin rash, fevers, chills, night sweats, weight loss, swollen lymph nodes, body aches, joint swelling, muscle aches, chest pain, shortness of breath, mood changes.   Objective:    Blood pressure 128/76, pulse 85, temperature 98.3 F (36.8 C), temperature source Oral, resp. rate 16, weight 161 lb 1.3 oz (73.065 kg), SpO2 96.00%.   General: No apparent distress alert and oriented x3 mood  and affect normal, dressed appropriately.  HEENT: Pupils equal, extraocular movements intact Respiratory: Patient's speak in full sentences and does not appear short of breath Cardiovascular: No lower extremity edema, non tender, no erythema Skin: Warm dry intact with no signs of infection or rash on extremities or on axial skeleton. Abdomen: Soft nontender Neuro: Cranial nerves II through XII are intact, neurovascularly intact in all extremities with 2+ DTRs and 2+ pulses. Lymph: No lymphadenopathy of posterior or anterior cervical chain or axillae bilaterally.  Gait antalgic ambulating with a cane MSK: Non tender with full range of motion and good stability and symmetric strength and tone of shoulders, elbows, wrist,  knee and ankles bilaterally.  Back Exam:  Inspection: Unremarkable  Motion: Flexion 35 deg, Extension 25 deg, Side Bending to 25 deg bilaterally,  Rotation to 45 deg bilaterally  SLR laying: Negative  XSLR laying: Negative  Palpable tenderness: Bilateral paraspinal musculature of the lumbar spine no spinous process tenderness. Significant pain over the right SI joint FABER: Positive on right but difficult to do secondary to an bilateral knee replacements Sensory change: Gross sensation intact to all lumbar and sacral dermatomes.  Reflexes: 2+ at both patellar tendons, 2+ at achilles tendons, Babinski's downgoing.  Strength at foot  Plantar-flexion: 5/5 Dorsi-flexion: 5/5 Eversion: 5/5 Inversion: 5/5  Leg strength  Quad: 5/5 Hamstring: 5/5 Hip flexor: 5/5 Hip abductors: 5/5  Hip: Right ROM IR: 15 Deg, with mild groin ER: 35 Deg, Flexion: 100 Deg, Extension: 100 Deg, Abduction: 45 Deg, Adduction: 45 Deg Strength IR: 5/5, ER: 5/5, Flexion: 5/5, Extension:  5/5, Abduction: 5/5, Adduction: 5/5 Pelvic alignment unremarkable to inspection and palpation. Greater trochanter nontender No tenderness over piriformis  Exam is not different from previous exam.   Impression and  Recommendations:     This case required medical decision making of moderate complexity.

## 2013-05-08 NOTE — Progress Notes (Signed)
Pre-visit discussion using our clinic review tool. No additional management support is needed unless otherwise documented below in the visit note.  

## 2013-05-09 ENCOUNTER — Ambulatory Visit: Payer: Medicare Other

## 2013-05-09 ENCOUNTER — Telehealth: Payer: Self-pay | Admitting: Internal Medicine

## 2013-05-09 NOTE — Telephone Encounter (Signed)
Patient called stating medicare has discontinued DEXILANT 60 MG capsule  And will not pay for this ... Patient was told to call here and see what else can  Be taken  Please advise

## 2013-05-10 ENCOUNTER — Telehealth: Payer: Self-pay | Admitting: *Deleted

## 2013-05-10 MED ORDER — OMEPRAZOLE 40 MG PO CPDR: 40.0000 mg | DELAYED_RELEASE_CAPSULE | Freq: Every day | ORAL | Status: DC

## 2013-05-10 MED ORDER — ESOMEPRAZOLE MAGNESIUM 40 MG PO CPDR: 40.0000 mg | DELAYED_RELEASE_CAPSULE | Freq: Every day | ORAL | Status: DC

## 2013-05-10 NOTE — Telephone Encounter (Signed)
Called pt inform her about the med change for nexium. Pt states she has 3 insurance and if nexium would go to mail service they would probably cover. Inform pt will send nexium to [facility], and if we get a denial from them will send the omeprazole...[name]

## 2013-05-10 NOTE — Telephone Encounter (Signed)
Change to generic omeprazole 40 mg -erx done

## 2013-05-10 NOTE — Telephone Encounter (Signed)
Will change to generic PPI

## 2013-05-10 NOTE — Telephone Encounter (Signed)
Received fax stating medicare prescription drug coverage for nexium is not covered...[name]

## 2013-05-10 NOTE — Telephone Encounter (Signed)
Called pt no answer LMOM with md response.../lmb 

## 2013-05-16 ENCOUNTER — Ambulatory Visit: Payer: Medicare Other

## 2013-05-16 ENCOUNTER — Telehealth: Payer: Self-pay | Admitting: Internal Medicine

## 2013-05-16 ENCOUNTER — Other Ambulatory Visit: Payer: Self-pay | Admitting: Family Medicine

## 2013-05-16 DIAGNOSIS — I10 Essential (primary) hypertension: Secondary | ICD-10-CM | POA: Diagnosis not present

## 2013-05-16 DIAGNOSIS — M5380 Other specified dorsopathies, site unspecified: Secondary | ICD-10-CM | POA: Diagnosis not present

## 2013-05-16 DIAGNOSIS — M545 Low back pain, unspecified: Secondary | ICD-10-CM | POA: Diagnosis not present

## 2013-05-16 DIAGNOSIS — M48061 Spinal stenosis, lumbar region without neurogenic claudication: Secondary | ICD-10-CM

## 2013-05-16 DIAGNOSIS — IMO0001 Reserved for inherently not codable concepts without codable children: Secondary | ICD-10-CM | POA: Diagnosis not present

## 2013-05-16 DIAGNOSIS — G8929 Other chronic pain: Secondary | ICD-10-CM

## 2013-05-16 DIAGNOSIS — R5381 Other malaise: Secondary | ICD-10-CM | POA: Diagnosis not present

## 2013-05-16 DIAGNOSIS — E119 Type 2 diabetes mellitus without complications: Secondary | ICD-10-CM | POA: Diagnosis not present

## 2013-05-16 NOTE — Telephone Encounter (Signed)
Spoke with pt advised of MDs message 

## 2013-05-16 NOTE — Telephone Encounter (Signed)
If in pain I think a good idea will put in for epidural.  L4-L5 as well as L5-S1.

## 2013-05-16 NOTE — Telephone Encounter (Signed)
Pt called request epidural injection. Please advise. Pt is in pain

## 2013-05-18 ENCOUNTER — Other Ambulatory Visit: Payer: Self-pay | Admitting: Family Medicine

## 2013-05-18 ENCOUNTER — Other Ambulatory Visit: Payer: Self-pay | Admitting: *Deleted

## 2013-05-18 ENCOUNTER — Ambulatory Visit: Payer: Medicare Other

## 2013-05-18 DIAGNOSIS — M545 Low back pain, unspecified: Secondary | ICD-10-CM

## 2013-05-18 DIAGNOSIS — G8929 Other chronic pain: Secondary | ICD-10-CM

## 2013-05-22 ENCOUNTER — Encounter: Payer: Self-pay | Admitting: Internal Medicine

## 2013-05-22 ENCOUNTER — Ambulatory Visit (INDEPENDENT_AMBULATORY_CARE_PROVIDER_SITE_OTHER): Payer: Medicare Other | Admitting: Internal Medicine

## 2013-05-22 VITALS — BP 142/68 | HR 69 | Temp 98.9°F | Wt 158.1 lb

## 2013-05-22 DIAGNOSIS — I10 Essential (primary) hypertension: Secondary | ICD-10-CM

## 2013-05-22 DIAGNOSIS — M48061 Spinal stenosis, lumbar region without neurogenic claudication: Secondary | ICD-10-CM

## 2013-05-22 DIAGNOSIS — M5430 Sciatica, unspecified side: Secondary | ICD-10-CM

## 2013-05-22 MED ORDER — HYDROCODONE-ACETAMINOPHEN 5-325 MG PO TABS: 1.0000 | ORAL_TABLET | Freq: Every evening | ORAL | Status: DC | PRN

## 2013-05-22 NOTE — Patient Instructions (Signed)
It was good to see you today.  We have reviewed your prior records including labs and tests today  we'll make referral to spine specialist for further evaluation and treatment of your back and leg pain/problems . Our office will contact you regarding appointment(s) once made.  Medications reviewed and updated. In addition to daily Mobic, and use Norco at bedtime as needed for pain -no other changes recommended  Your prescription(s) have been Given to you to submit to your pharmacy. Please take as directed and contact our office if you believe you are having problem(s) with the medication(s).  followup in 6 weeks for routine review ( diabetes, blood pressure, etc.), please call sooner if problems

## 2013-05-22 NOTE — Assessment & Plan Note (Signed)
Significant spinal stenosis at L4-5 and L5-S1 on MRI December 2014 Has exhausted conservative therapy with sports medicine and unimproved with physical therapy Refer to spine specialist (saullo or cohen) for further evaluation and treatment of this problem Continue nocturnal gabapentin with addition of narcotic as needed

## 2013-05-22 NOTE — Assessment & Plan Note (Signed)
Fair control - continue ongoing medical therapy as ongoing  BP Readings from Last 3 Encounters:  05/22/13 142/68  05/08/13 128/76  04/24/13 132/78

## 2013-05-22 NOTE — Progress Notes (Signed)
Subjective:    Patient ID: [name], female    DOB: [birth year], 80 y.o.   MRN: [id number]  HPI  Here for follow up -  reviewed chronic medical issues and interval medical events   Past Medical History  Diagnosis Date  . S/P CABG x 1   . S/P inguinal hernia repair   . 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 CAD, NATIVE VESSEL 06/09/2008  . UNSPECIFIED ANEMIA   . DIABETES MELLITUS, TYPE II   . GERD   . HYPERLIPIDEMIA   . HYPERTENSION   . HIATAL HERNIA   . FIBROMYALGIA   . OA (osteoarthritis)     severe, shoulders, hands - inflammatory, ?RA  . Gout   . Insomnia     Review of Systems  Constitutional: Negative for fever and fatigue.  Respiratory: Negative for cough and shortness of breath.   Cardiovascular: Negative for chest pain and leg swelling.  Musculoskeletal: Positive for back pain and myalgias. Negative for joint swelling.       Objective:   Physical Exam BP 142/68  Pulse 69  Temp(Src) 98.9 F (37.2 C) (Oral)  Wt 158 lb 1.9 oz (71.723 kg)  SpO2 96% Wt Readings from Last 3 Encounters:  05/22/13 158 lb 1.9 oz (71.723 kg)  05/08/13 161 lb 1.3 oz (73.065 kg)  04/27/13 162 lb (73.483 kg)   Constitutional: She appears well-developed and well-nourished. No distress.  Neck: Normal range of motion. Neck supple. No JVD present. No thyromegaly present.  Cardiovascular: Normal rate, regular rhythm and normal heart sounds.  No murmur heard. No BLE edema. Pulmonary/Chest: Effort normal and breath sounds normal. No respiratory distress. She has no wheezes.  MSkel: chronic R knee enlargement s/p remote TKR. Back: Back: full range of motion of thoracic and lumbar spine. Non tender to palpation. Positive R ipsilateral straight leg raise. DTR's are symmetrically intact. Sensation intact in all dermatomes of the lower extremities. Full strength to manual muscle testing. patient is able to heel toe walk  without difficulty and ambulates with antalgic gait. Skin: Skin is warm and dry. No rash noted. No erythema.  Psychiatric: She has a normal mood and affect. Her behavior is normal. Judgment and thought content normal.   Lab Results  Component Value Date   WBC 5.9 07/06/2012   HGB 12.9 07/06/2012   HCT 39.1 07/06/2012   PLT 184.0 07/06/2012   GLUCOSE 102* 07/06/2012   CHOL 182 12/22/2012   TRIG 51.0 12/22/2012   HDL 97.50 12/22/2012   LDLDIRECT 115.3 09/30/2009   LDLCALC 74 12/22/2012   ALT 20 07/06/2012   AST 27 07/06/2012   NA 139 07/06/2012   K 4.0 07/06/2012   CL 105 07/06/2012   CREATININE 0.9 07/06/2012   BUN 12 07/06/2012   CO2 26 07/06/2012   TSH 0.92 07/06/2012   INR 0.99 11/06/2010   HGBA1C 6.0 12/22/2012   MICROALBUR 3.4* 12/22/2012    Mr Lumbar Spine Wo Contrast  04/19/2013   CLINICAL DATA:  Low back and bilateral leg pain.  EXAM: MRI LUMBAR SPINE WITHOUT CONTRAST  TECHNIQUE: Multiplanar, multisequence MR imaging was performed. No intravenous contrast was administered.  COMPARISON:  05/28/2011  FINDINGS: There is no abnormality at L3-4 or above. The discs are unremarkable. The canal and foramina are widely patent. The distal cord and conus are normal. There is mild facet degeneration at L3-4 because of the exaggerated lordosis, but  no slippage or edematous change.  L4-5: There is chronic facet arthropathy with anterolisthesis of 1 cm. The disc is chronically degenerated with complete loss of height. There is severe multifactorial stenosis at this level. The foramina are stenotic as well.  L5-S1: There is bilateral facet arthropathy with 5 mm of anterolisthesis. The disc is degenerated show shallow broad-based protrusion. There is stenosis of the lateral recesses and foramina at this level that could cause neural compression.  Compared to the previous study, no change is appreciated.  IMPRESSION: Exaggerated lordosis.  Mild facet degeneration L3-4 without slippage or stenosis.  Advanced chronic facet  arthropathy at L4-5 with 1 mm of anterolisthesis. Chronic disc degeneration with complete loss of disc height. Stenosis of the canal and lateral recesses stress that stenosis of the canal and neural foramina could cause neural compression.  Advanced chronic facet arthropathy at L5-S1 with 5 mm of anterolisthesis. Broad-based protrusion of disc material. Stenosis of the lateral recesses and foramina that could cause neural compression.  No apparent change since the study of February 2013.   Electronically Signed   By: Nelson Chimes M.D.   On: 04/19/2013 14:30      Assessment & Plan:   See problem list. Medications and labs reviewed today.  Time spent with pt today 25 minutes, greater than 50% time spent counseling patient on LBP with RLE radiation and medication review. Also review of prior records

## 2013-05-22 NOTE — Progress Notes (Signed)
Pre-visit discussion using our clinic review tool. No additional management support is needed unless otherwise documented below in the visit note.  

## 2013-05-22 NOTE — Assessment & Plan Note (Signed)
RLE pain symptoms ongoing >6 weeks, unresponsive to conservative care at direction of sports med Will refer to orthospine out to consider other injection such as epidural or facet injection versus laminectomy for spinal stenosis, see below Narcotic pain medication for nocturnal management, continue anti-inflammatory as ongoing

## 2013-05-23 ENCOUNTER — Telehealth: Payer: Self-pay | Admitting: Internal Medicine

## 2013-05-23 ENCOUNTER — Ambulatory Visit: Payer: Medicare Other

## 2013-05-23 NOTE — Telephone Encounter (Signed)
Relevant patient education assigned to patient using Emmi. ° °

## 2013-05-24 ENCOUNTER — Other Ambulatory Visit: Payer: Self-pay | Admitting: Internal Medicine

## 2013-05-25 ENCOUNTER — Ambulatory Visit: Payer: Medicare Other | Admitting: Physical Therapy

## 2013-05-26 ENCOUNTER — Telehealth: Payer: Self-pay | Admitting: Internal Medicine

## 2013-05-26 DIAGNOSIS — M961 Postlaminectomy syndrome, not elsewhere classified: Secondary | ICD-10-CM | POA: Diagnosis not present

## 2013-05-26 DIAGNOSIS — M5430 Sciatica, unspecified side: Secondary | ICD-10-CM | POA: Insufficient documentation

## 2013-05-26 DIAGNOSIS — R739 Hyperglycemia, unspecified: Secondary | ICD-10-CM

## 2013-05-26 DIAGNOSIS — M48061 Spinal stenosis, lumbar region without neurogenic claudication: Secondary | ICD-10-CM | POA: Insufficient documentation

## 2013-05-26 NOTE — Telephone Encounter (Signed)
Order place for A1c in our lab. We will contact her with these results and fax to Dr. Maia Petties once these are reviewed

## 2013-05-26 NOTE — Telephone Encounter (Addendum)
Patient states that Dr. Maia Petties her spine doctor is wanting her A1c level checked and sent to them at Fax#: [phone]. Patient states that she was told that the results could not be more than 7 days old.  They advised that she call our office.  Patient is asking for a call back when an order is put in so that she can come to the lab to have it checked.

## 2013-05-29 ENCOUNTER — Other Ambulatory Visit (INDEPENDENT_AMBULATORY_CARE_PROVIDER_SITE_OTHER): Payer: Medicare Other

## 2013-05-29 DIAGNOSIS — R7309 Other abnormal glucose: Secondary | ICD-10-CM | POA: Diagnosis not present

## 2013-05-29 DIAGNOSIS — R739 Hyperglycemia, unspecified: Secondary | ICD-10-CM

## 2013-05-29 LAB — HEMOGLOBIN A1C: Hgb A1c MFr Bld: 6.2 % (ref 4.6–6.5)

## 2013-05-29 NOTE — Telephone Encounter (Signed)
Notified pt with md response.../lmb 

## 2013-05-30 ENCOUNTER — Ambulatory Visit: Payer: Medicare Other | Admitting: Physical Therapy

## 2013-06-01 ENCOUNTER — Ambulatory Visit: Payer: Medicare Other

## 2013-06-09 ENCOUNTER — Other Ambulatory Visit (INDEPENDENT_AMBULATORY_CARE_PROVIDER_SITE_OTHER): Payer: Medicare Other

## 2013-06-09 ENCOUNTER — Ambulatory Visit (INDEPENDENT_AMBULATORY_CARE_PROVIDER_SITE_OTHER): Payer: Medicare Other | Admitting: Internal Medicine

## 2013-06-09 ENCOUNTER — Ambulatory Visit (INDEPENDENT_AMBULATORY_CARE_PROVIDER_SITE_OTHER)
Admission: RE | Admit: 2013-06-09 | Discharge: 2013-06-09 | Disposition: A | Discharge status: Home / Self Care | Payer: Medicare Other | Source: Ambulatory Visit | Attending: Internal Medicine | Admitting: Internal Medicine

## 2013-06-09 ENCOUNTER — Encounter: Payer: Self-pay | Admitting: Internal Medicine

## 2013-06-09 VITALS — BP 122/70 | HR 59 | Temp 98.2°F | Wt 158.0 lb

## 2013-06-09 DIAGNOSIS — M48061 Spinal stenosis, lumbar region without neurogenic claudication: Secondary | ICD-10-CM | POA: Diagnosis not present

## 2013-06-09 DIAGNOSIS — R059 Cough, unspecified: Secondary | ICD-10-CM

## 2013-06-09 DIAGNOSIS — F32A Depression, unspecified: Secondary | ICD-10-CM

## 2013-06-09 DIAGNOSIS — F3289 Other specified depressive episodes: Secondary | ICD-10-CM | POA: Diagnosis not present

## 2013-06-09 DIAGNOSIS — R05 Cough: Secondary | ICD-10-CM | POA: Diagnosis not present

## 2013-06-09 DIAGNOSIS — J40 Bronchitis, not specified as acute or chronic: Secondary | ICD-10-CM | POA: Diagnosis not present

## 2013-06-09 DIAGNOSIS — R4182 Altered mental status, unspecified: Secondary | ICD-10-CM | POA: Diagnosis not present

## 2013-06-09 DIAGNOSIS — F329 Major depressive disorder, single episode, unspecified: Secondary | ICD-10-CM | POA: Diagnosis not present

## 2013-06-09 DIAGNOSIS — N390 Urinary tract infection, site not specified: Secondary | ICD-10-CM

## 2013-06-09 LAB — HEPATIC FUNCTION PANEL
ALT: 18 U/L (ref 0–35)
AST: 27 U/L (ref 0–37)
Albumin: 3.5 g/dL (ref 3.5–5.2)
Alkaline Phosphatase: 60 U/L (ref 39–117)
Bilirubin, Direct: 0.1 mg/dL (ref 0.0–0.3)
Total Bilirubin: 0.7 mg/dL (ref 0.3–1.2)
Total Protein: 6.8 g/dL (ref 6.0–8.3)

## 2013-06-09 LAB — TSH: TSH: 0.77 u[IU]/mL (ref 0.35–5.50)

## 2013-06-09 LAB — BASIC METABOLIC PANEL
BUN: 6 mg/dL (ref 6–23)
CO2: 28 mEq/L (ref 19–32)
Calcium: 9.1 mg/dL (ref 8.4–10.5)
Chloride: 96 mEq/L (ref 96–112)
Creatinine, Ser: 0.8 mg/dL (ref 0.4–1.2)
GFR: 88.54 mL/min (ref >60.00)
Glucose, Bld: 95 mg/dL (ref 70–99)
Potassium: 4.5 mEq/L (ref 3.5–5.1)
Sodium: 131 mEq/L — ABNORMAL LOW (ref 135–145)

## 2013-06-09 LAB — URINALYSIS, ROUTINE W REFLEX MICROSCOPIC
Bilirubin Urine: NEGATIVE
Hgb urine dipstick: NEGATIVE
Ketones, ur: NEGATIVE
Nitrite: NEGATIVE
Specific Gravity, Urine: <=1.005 — AB (ref 1.000–1.030)
Total Protein, Urine: NEGATIVE
Urine Glucose: NEGATIVE
Urobilinogen, UA: 0.2 (ref 0.0–1.0)
pH: 6.5 (ref 5.0–8.0)

## 2013-06-09 LAB — CBC WITH DIFFERENTIAL/PLATELET
Basophils Absolute: 0 10*3/uL (ref 0.0–0.1)
Basophils Relative: 0.5 % (ref 0.0–3.0)
Eosinophils Absolute: 0.2 10*3/uL (ref 0.0–0.7)
Eosinophils Relative: 2.9 % (ref 0.0–5.0)
HCT: 31.4 % — ABNORMAL LOW (ref 36.0–46.0)
Hemoglobin: 9.7 g/dL — ABNORMAL LOW (ref 12.0–15.0)
Lymphocytes Relative: 32.4 % (ref 12.0–46.0)
Lymphs Abs: 2.2 10*3/uL (ref 0.7–4.0)
MCHC: 30.9 g/dL (ref 30.0–36.0)
MCV: 79.9 fl (ref 78.0–100.0)
Monocytes Absolute: 0.6 10*3/uL (ref 0.1–1.0)
Monocytes Relative: 8.8 % (ref 3.0–12.0)
Neutro Abs: 3.8 10*3/uL (ref 1.4–7.7)
Neutrophils Relative %: 55.4 % (ref 43.0–77.0)
Platelets: 314 10*3/uL (ref 150.0–400.0)
RBC: 3.93 Mil/uL (ref 3.87–5.11)
RDW: 17.5 % — ABNORMAL HIGH (ref 11.5–14.6)
WBC: 6.9 10*3/uL (ref 4.5–10.5)

## 2013-06-09 MED ORDER — SERTRALINE HCL 25 MG PO TABS: 25.0000 mg | ORAL_TABLET | Freq: Every day | ORAL | Status: DC

## 2013-06-09 NOTE — Progress Notes (Signed)
Subjective:    Patient ID: [name], female    DOB: [birth year], 80 y.o.   MRN: [id number]  HPI  Patient is here for altered mental status as reported by daughter. Daughter at side notes patient has been tremendously stressed at home with concerns about personal health, finance and family issues. Recently began low-dose scheduled work for back/leg pain which has not improved pain symptoms -upcoming injection for same. Daughter describes patient status as confused, forgetful, "drugged" since beginning Lyrica on top of chronic medications hydrocodone and alprazolam. Denies other medication changes.  Reviewed chronic medical issues and interval medical events  Past Medical History  Diagnosis Date  . S/P CABG x 1   . S/P inguinal hernia repair   . TUBULOVILLOUS ADENOMA, 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    colo 04/2012 - no polyps - no further screening planned (age >22)  . CAD, NATIVE VESSEL 06/09/2008  . UNSPECIFIED ANEMIA   . DIABETES MELLITUS, TYPE II   . GERD   . HYPERLIPIDEMIA   . HYPERTENSION   . HIATAL HERNIA   . FIBROMYALGIA   . OA (osteoarthritis)     severe, shoulders, hands - inflammatory, ?RA  . Gout   . Insomnia     Review of Systems  Constitutional: Positive for fatigue. Negative for fever, activity change, appetite change and unexpected weight change.  HENT: Negative for sinus pressure and sneezing.   Eyes: Negative for visual disturbance.  Respiratory: Positive for cough (dry x 72h). Negative for chest tightness and shortness of breath.   Cardiovascular: Negative for chest pain, palpitations and leg swelling.  Gastrointestinal: Negative for nausea, vomiting, abdominal pain and diarrhea.  Musculoskeletal: Positive for arthralgias and back pain (upcoming injection for RLE lumbar radicular sx (chronic pain, stable)).  Psychiatric/Behavioral: Positive for sleep disturbance, dysphoric mood and decreased concentration. Negative for suicidal ideas, hallucinations, confusion,  self-injury and agitation. The patient is nervous/anxious.        Objective:   Physical Exam  BP 122/70  Pulse 59  Temp(Src) 98.2 F (36.8 C) (Oral)  Wt 158 lb (71.668 kg)  SpO2 94% Wt Readings from Last 3 Encounters:  06/09/13 158 lb (71.668 kg)  05/22/13 158 lb 1.9 oz (71.723 kg)  05/08/13 161 lb 1.3 oz (73.065 kg)    Constitutional: She appears well-developed and well-nourished. "drugged" like, "slow" interaction (verbal), daughter at side (reports pt took xanax 1 hour ago exacerbating current mental state. No acute distress.  HENT: normocephalic atraumatic. Sinuses nontender. Oropharynx clear without erythema or exudate  Eyes: PERRL, EOMI. No conjunctivitis or injection or icterus. Uncorrected vision grossly intact Neck: Normal range of motion. Neck supple. No JVD present. No thyromegaly present.  Cardiovascular: Normal rate, regular rhythm and normal heart sounds.  No murmur heard. No BLE edema. Pulmonary/Chest: Effort normal and breath sounds normal. No respiratory distress. She has no wheezes.  Abdomen: SNTND, +BS Neurologic: awake and oriented x3. Speech is "thick" but coherent/fluent. Generalized motor with slight ataxia but no unilateral deficit. Cranial nerves II through XII symmetrically intact. Gait cautious but independent.  Psychiatric: She has a normal mood and affect. Her behavior is normal. Judgment and thought content normal.   Lab Results  Component Value Date   WBC 5.9 07/06/2012   HGB 12.9 07/06/2012   HCT 39.1 07/06/2012   PLT 184.0 07/06/2012   GLUCOSE 102* 07/06/2012   CHOL 182 12/22/2012   TRIG 51.0 12/22/2012   HDL 97.50 12/22/2012   LDLDIRECT 115.3 09/30/2009   LDLCALC 74 12/22/2012  ALT 20 07/06/2012   AST 27 07/06/2012   NA 139 07/06/2012   K 4.0 07/06/2012   CL 105 07/06/2012   CREATININE 0.9 07/06/2012   BUN 12 07/06/2012   CO2 26 07/06/2012   TSH 0.92 07/06/2012   INR 0.99 11/06/2010   HGBA1C 6.2 05/29/2013   MICROALBUR 3.4* 12/22/2012    Mr Lumbar Spine  Wo Contrast  04/19/2013   CLINICAL DATA:  Low back and bilateral leg pain.  EXAM: MRI LUMBAR SPINE WITHOUT CONTRAST  TECHNIQUE: Multiplanar, multisequence MR imaging was performed. No intravenous contrast was administered.  COMPARISON:  05/28/2011  FINDINGS: There is no abnormality at L3-4 or above. The discs are unremarkable. The canal and foramina are widely patent. The distal cord and conus are normal. There is mild facet degeneration at L3-4 because of the exaggerated lordosis, but no slippage or edematous change.  L4-5: There is chronic facet arthropathy with anterolisthesis of 1 cm. The disc is chronically degenerated with complete loss of height. There is severe multifactorial stenosis at this level. The foramina are stenotic as well.  L5-S1: There is bilateral facet arthropathy with 5 mm of anterolisthesis. The disc is degenerated show shallow broad-based protrusion. There is stenosis of the lateral recesses and foramina at this level that could cause neural compression.  Compared to the previous study, no change is appreciated.  IMPRESSION: Exaggerated lordosis.  Mild facet degeneration L3-4 without slippage or stenosis.  Advanced chronic facet arthropathy at L4-5 with 1 mm of anterolisthesis. Chronic disc degeneration with complete loss of disc height. Stenosis of the canal and lateral recesses stress that stenosis of the canal and neural foramina could cause neural compression.  Advanced chronic facet arthropathy at L5-S1 with 5 mm of anterolisthesis. Broad-based protrusion of disc material. Stenosis of the lateral recesses and foramina that could cause neural compression.  No apparent change since the study of February 2013.   Electronically Signed   By: Nelson Chimes M.D.   On: 04/19/2013 14:30       Assessment & Plan:   Altered mental status. Onset with initiation of Lyrica. Suspect medication effect combined and potentiated by chronic centrally acting medications such as hydrocodone and  alprazolam. Will check today for other metabolic or potential infectious etiology contributing to same. Patient advised to discontinue Lyrica as likely causing side effects without benefit of pain relief. Reviewed MRI 2012 brain without acute change, explain differential with patient and daughter and reviewed step process for further evaluation if mental status not returned to normal with medication changes if other workup unremarkable. Daughter expresses understanding and will call if mental status unimproved in next few days, sooner if worse  Cough. Dry. Suspect allergic component rather than infection but given altered mental status, check chest x-ray today. Advised over-the-counter medications as needed pending further clarification of diagnosis  Problem List Items Addressed This Visit   Spinal stenosis of lumbar region     Significant spinal stenosis at L4-5 and L5-S1 on MRI December 2014 Has exhausted conservative therapy with sports medicine and unimproved with physical therapy follow up with spine specialist (saullo) for further evaluation and treatment of this problem As planned for ESI, reviewed today Discontinue Lyrica but okay to continue nocturnal gabapentin with addition of narcotic as needed     Other Visit Diagnoses   Altered mental status    -  Primary    Relevant Orders       Basic metabolic panel (Completed)       CBC  with Differential (Completed)       Hepatic function panel (Completed)       TSH (Completed)       Urinalysis, Routine w reflex microscopic (Completed)    Cough        Relevant Orders       DG Chest 2 View (Completed)      Depression. See problem list for further details. Initiate sertraline at this time. Prescription done. Followup 4- 6 weeksreview and titration as needed, sooner if problems. Patient/daughter agree and understand

## 2013-06-09 NOTE — Progress Notes (Signed)
Pre-visit discussion using our clinic review tool. No additional management support is needed unless otherwise documented below in the visit note.  

## 2013-06-09 NOTE — Patient Instructions (Signed)
It was good to see you today.  We have reviewed your prior records including labs and tests today  Test(s) ordered today. Your results will be released to [place] (or called to you) after review, usually within 72hours after test completion. If any changes need to be made, you will be notified at that same time.  Medications reviewed and updated Stop Lyrica Start sertraline no other changes recommended at this time.  Please schedule followup in 4-6 weeks, call sooner if problems/unimproved.

## 2013-06-10 DIAGNOSIS — F329 Major depressive disorder, single episode, unspecified: Secondary | ICD-10-CM | POA: Insufficient documentation

## 2013-06-10 DIAGNOSIS — F32A Depression, unspecified: Secondary | ICD-10-CM | POA: Insufficient documentation

## 2013-06-10 MED ORDER — CIPROFLOXACIN HCL 250 MG PO TABS: 250.0000 mg | ORAL_TABLET | Freq: Two times a day (BID) | ORAL | Status: DC

## 2013-06-10 NOTE — Assessment & Plan Note (Signed)
chronic mild depression, Exacerbated by  personal medical issues and family/social stressors. Daughter who is psychologist request to begin low-dose medication to help patient's symptoms at this time. We reviewed possible overlap of depression with "pseudodementia" - history of similar reaction Aug 19, 2010 following death of the patient's sister prior results of tsh and b12 and other labs + mri brain reviewed - no abn Recheck labs now as for acute mental status changes Initiate low-dose sertraline, followup in 4-6 weeks for titration as needed

## 2013-06-10 NOTE — Assessment & Plan Note (Signed)
Significant spinal stenosis at L4-5 and L5-S1 on MRI December 2014 Has exhausted conservative therapy with sports medicine and unimproved with physical therapy follow up with spine specialist (saullo) for further evaluation and treatment of this problem As planned for ESI, reviewed today Discontinue Lyrica but okay to continue nocturnal gabapentin with addition of narcotic as needed

## 2013-06-10 NOTE — Addendum Note (Signed)
Addended by: Gwendolyn Grant A on: 06/10/2013 10:30 AM   Modules accepted: Orders

## 2013-06-14 ENCOUNTER — Ambulatory Visit: Payer: Medicare Other | Admitting: Dietician

## 2013-06-22 DIAGNOSIS — M47817 Spondylosis without myelopathy or radiculopathy, lumbosacral region: Secondary | ICD-10-CM | POA: Diagnosis not present

## 2013-06-22 DIAGNOSIS — M545 Low back pain, unspecified: Secondary | ICD-10-CM | POA: Diagnosis not present

## 2013-06-22 DIAGNOSIS — Q762 Congenital spondylolisthesis: Secondary | ICD-10-CM | POA: Diagnosis not present

## 2013-06-27 DIAGNOSIS — B351 Tinea unguium: Secondary | ICD-10-CM | POA: Diagnosis not present

## 2013-06-27 DIAGNOSIS — E1149 Type 2 diabetes mellitus with other diabetic neurological complication: Secondary | ICD-10-CM | POA: Diagnosis not present

## 2013-06-28 ENCOUNTER — Other Ambulatory Visit: Payer: Self-pay | Admitting: Internal Medicine

## 2013-06-28 ENCOUNTER — Ambulatory Visit: Payer: Medicare Other | Admitting: Internal Medicine

## 2013-07-05 ENCOUNTER — Other Ambulatory Visit: Payer: Self-pay | Admitting: Family Medicine

## 2013-07-06 NOTE — Telephone Encounter (Signed)
Refill done.  

## 2013-07-10 DIAGNOSIS — M545 Low back pain, unspecified: Secondary | ICD-10-CM | POA: Diagnosis not present

## 2013-07-10 DIAGNOSIS — Q762 Congenital spondylolisthesis: Secondary | ICD-10-CM | POA: Diagnosis not present

## 2013-07-10 DIAGNOSIS — M47817 Spondylosis without myelopathy or radiculopathy, lumbosacral region: Secondary | ICD-10-CM | POA: Diagnosis not present

## 2013-07-13 DIAGNOSIS — W57XXXA Bitten or stung by nonvenomous insect and other nonvenomous arthropods, initial encounter: Secondary | ICD-10-CM | POA: Diagnosis not present

## 2013-07-13 DIAGNOSIS — T148 Other injury of unspecified body region: Secondary | ICD-10-CM | POA: Diagnosis not present

## 2013-07-19 DIAGNOSIS — M545 Low back pain, unspecified: Secondary | ICD-10-CM | POA: Diagnosis not present

## 2013-07-19 DIAGNOSIS — Q762 Congenital spondylolisthesis: Secondary | ICD-10-CM | POA: Diagnosis not present

## 2013-07-19 DIAGNOSIS — M47817 Spondylosis without myelopathy or radiculopathy, lumbosacral region: Secondary | ICD-10-CM | POA: Diagnosis not present

## 2013-07-25 DIAGNOSIS — B351 Tinea unguium: Secondary | ICD-10-CM | POA: Diagnosis not present

## 2013-07-25 DIAGNOSIS — M79609 Pain in unspecified limb: Secondary | ICD-10-CM | POA: Diagnosis not present

## 2013-07-27 ENCOUNTER — Encounter: Payer: Medicare Other | Attending: Internal Medicine | Admitting: Dietician

## 2013-07-27 VITALS — Ht 59.0 in | Wt 154.4 lb

## 2013-07-27 DIAGNOSIS — Z6833 Body mass index (BMI) 33.0-33.9, adult: Secondary | ICD-10-CM | POA: Diagnosis not present

## 2013-07-27 DIAGNOSIS — Z713 Dietary counseling and surveillance: Secondary | ICD-10-CM | POA: Diagnosis not present

## 2013-07-27 DIAGNOSIS — E669 Obesity, unspecified: Secondary | ICD-10-CM | POA: Insufficient documentation

## 2013-07-27 DIAGNOSIS — K5900 Constipation, unspecified: Secondary | ICD-10-CM | POA: Insufficient documentation

## 2013-07-27 NOTE — Progress Notes (Signed)
  Medical Nutrition Therapy:  Appt start time: 0938 end time:  100.  Assessment:  Primary concerns today: Neisha is here today for a follow up for weight loss. Having a lot of back pain and had some injections which are not working. Lost 8 lbs sit last visit in January. Has not been able to exercise d/t back pain. Has been having more fruits and vegetables. Still having problems with constipation. Increasing water intake.   Wt Readings from Last 3 Encounters:  07/27/13 154 lb 6.4 oz (70.035 kg)  06/09/13 158 lb (71.668 kg)  05/22/13 158 lb 1.9 oz (71.723 kg)   Ht Readings from Last 3 Encounters:  07/27/13 4\' 11"  (1.499 m)  04/27/13 4\' 11"  (1.499 m)  03/30/13 4\' 11"  (1.499 m)   Body mass index is 31.17 kg/(m^2). @BMIFA @ Normalized weight-for-age data available only for age 44 to 57 years. Normalized stature-for-age data available only for age 44 to 60 years.  Preferred Learning Style:   No preference indicated   Learning Readiness:   Ready  MEDICATIONS: see list   DIETARY INTAKE:  24-hr recall:  B ( AM): blending fruits and vegetables 2 servings: (1/2 apple, 1/2 banana, handful of berries, spinach, scoop protein powder, 1/4 cup almond, water) with boiled egg OR cereal (Raisin Bran, Fiber One with brain flakes or Cheerios with almonds) with 1 or 2% milk Snk ( AM): almond butter with crackers  L ( PM): tomato based soup with bread and cheese with tomato with coffee  Snk ( PM): protein bar D ( PM): green beans and greens or cabbage with chicken or pork chop with potatoes or sweet potatoes or salad with meat Snk ( PM): banana with tea or ginger snaps  Beverages: 3 cups of coffee with creamer with honey and splenda, tea at night, 2 during the night 8 oz water, and 4 x 8 oz   Usual physical activity: unable to exercise d/t back pain  Estimated energy needs: 1400 calories 158 g carbohydrates 105 g protein 39 g fat  Progress Towards Goal(s):  In progress.   Nutritional  Diagnosis:  [place]-3.3 Overweight/obesity As related to history energy dense food choices.  As evidenced by BMI of 33.3    Intervention:  Nutrition couseling provided:  Plan: Continue eating fruits, vegetables, and drinking a lot of water. Exercise when you are able to in the water or in the gym when the pain is better.     Teaching Method Utilized:  Visual Auditory  Barriers to learning/adherence to lifestyle change: none  Demonstrated degree of understanding via:  Teach Back   Monitoring/Evaluation:  Dietary intake, exercise, and body weight prn

## 2013-07-27 NOTE — Patient Instructions (Addendum)
Continue eating fruits, vegetables, and drinking a lot of water. Exercise when you are able to in the water or in the gym when the pain is better.

## 2013-07-28 ENCOUNTER — Other Ambulatory Visit: Payer: Self-pay | Admitting: Internal Medicine

## 2013-08-01 ENCOUNTER — Other Ambulatory Visit: Payer: Self-pay | Admitting: Internal Medicine

## 2013-08-02 NOTE — Telephone Encounter (Signed)
Faxed script back to [facility].../lmb 

## 2013-08-09 DIAGNOSIS — M79609 Pain in unspecified limb: Secondary | ICD-10-CM | POA: Diagnosis not present

## 2013-08-11 DIAGNOSIS — IMO0002 Reserved for concepts with insufficient information to code with codable children: Secondary | ICD-10-CM | POA: Diagnosis not present

## 2013-08-17 DIAGNOSIS — M79609 Pain in unspecified limb: Secondary | ICD-10-CM | POA: Diagnosis not present

## 2013-08-22 DIAGNOSIS — M79609 Pain in unspecified limb: Secondary | ICD-10-CM | POA: Diagnosis not present

## 2013-08-22 DIAGNOSIS — B351 Tinea unguium: Secondary | ICD-10-CM | POA: Diagnosis not present

## 2013-08-29 DIAGNOSIS — Q762 Congenital spondylolisthesis: Secondary | ICD-10-CM | POA: Diagnosis not present

## 2013-08-29 DIAGNOSIS — M5430 Sciatica, unspecified side: Secondary | ICD-10-CM | POA: Diagnosis not present

## 2013-08-29 DIAGNOSIS — M545 Low back pain, unspecified: Secondary | ICD-10-CM | POA: Diagnosis not present

## 2013-09-12 DIAGNOSIS — B351 Tinea unguium: Secondary | ICD-10-CM | POA: Diagnosis not present

## 2013-09-12 DIAGNOSIS — M79609 Pain in unspecified limb: Secondary | ICD-10-CM | POA: Diagnosis not present

## 2013-09-19 DIAGNOSIS — M545 Low back pain, unspecified: Secondary | ICD-10-CM | POA: Diagnosis not present

## 2013-09-19 DIAGNOSIS — M5430 Sciatica, unspecified side: Secondary | ICD-10-CM | POA: Diagnosis not present

## 2013-09-19 DIAGNOSIS — Q762 Congenital spondylolisthesis: Secondary | ICD-10-CM | POA: Diagnosis not present

## 2013-09-28 ENCOUNTER — Other Ambulatory Visit (INDEPENDENT_AMBULATORY_CARE_PROVIDER_SITE_OTHER): Payer: Medicare Other

## 2013-09-28 ENCOUNTER — Ambulatory Visit (INDEPENDENT_AMBULATORY_CARE_PROVIDER_SITE_OTHER): Payer: Medicare Other | Admitting: Internal Medicine

## 2013-09-28 ENCOUNTER — Encounter: Payer: Self-pay | Admitting: Internal Medicine

## 2013-09-28 VITALS — BP 112/62 | HR 59 | Temp 98.3°F | Wt 147.2 lb

## 2013-09-28 DIAGNOSIS — I10 Essential (primary) hypertension: Secondary | ICD-10-CM | POA: Diagnosis not present

## 2013-09-28 DIAGNOSIS — D649 Anemia, unspecified: Secondary | ICD-10-CM

## 2013-09-28 DIAGNOSIS — E119 Type 2 diabetes mellitus without complications: Secondary | ICD-10-CM

## 2013-09-28 LAB — BASIC METABOLIC PANEL
BUN: 7 mg/dL (ref 6–23)
CO2: 27 mEq/L (ref 19–32)
Calcium: 9.6 mg/dL (ref 8.4–10.5)
Chloride: 103 mEq/L (ref 96–112)
Creatinine, Ser: 0.7 mg/dL (ref 0.4–1.2)
GFR: 112.42 mL/min (ref >60.00)
Glucose, Bld: 110 mg/dL — ABNORMAL HIGH (ref 70–99)
Potassium: 4.2 mEq/L (ref 3.5–5.1)
Sodium: 138 mEq/L (ref 135–145)

## 2013-09-28 LAB — CBC WITH DIFFERENTIAL/PLATELET
Basophils Absolute: 0 10*3/uL (ref 0.0–0.1)
Basophils Relative: 0.1 % (ref 0.0–3.0)
Eosinophils Absolute: 0.2 10*3/uL (ref 0.0–0.7)
Eosinophils Relative: 2.9 % (ref 0.0–5.0)
HCT: 31.8 % — ABNORMAL LOW (ref 36.0–46.0)
Hemoglobin: 10.1 g/dL — ABNORMAL LOW (ref 12.0–15.0)
Lymphocytes Relative: 30.1 % (ref 12.0–46.0)
Lymphs Abs: 1.7 10*3/uL (ref 0.7–4.0)
MCHC: 31.7 g/dL (ref 30.0–36.0)
MCV: 77.2 fl — ABNORMAL LOW (ref 78.0–100.0)
Monocytes Absolute: 0.4 10*3/uL (ref 0.1–1.0)
Monocytes Relative: 7.9 % (ref 3.0–12.0)
Neutro Abs: 3.3 10*3/uL (ref 1.4–7.7)
Neutrophils Relative %: 59 % (ref 43.0–77.0)
Platelets: 289 10*3/uL (ref 150.0–400.0)
RBC: 4.12 Mil/uL (ref 3.87–5.11)
RDW: 18 % — ABNORMAL HIGH (ref 11.5–15.5)
WBC: 5.5 10*3/uL (ref 4.0–10.5)

## 2013-09-28 LAB — FERRITIN: Ferritin: 14.4 ng/mL (ref 10.0–291.0)

## 2013-09-28 LAB — HEMOGLOBIN A1C: Hgb A1c MFr Bld: 6.1 % (ref 4.6–6.5)

## 2013-09-28 NOTE — Progress Notes (Signed)
Pre visit review using our clinic review tool, if applicable. No additional management support is needed unless otherwise documented below in the visit note. 

## 2013-09-28 NOTE — Assessment & Plan Note (Signed)
Fair control - continue ongoing medical therapy as ongoing  BP Readings from Last 3 Encounters:  09/28/13 112/62  06/09/13 122/70  05/22/13 142/68

## 2013-09-28 NOTE — Patient Instructions (Signed)
It was good to see you today.  We have reviewed your prior records including labs and tests today  Test(s) ordered today. Your results will be released to [place] (or called to you) after review, usually within 72hours after test completion. If any changes need to be made, you will be notified at that same time.  Medications reviewed and updated, no changes recommended at this time. Refill on medication(s) as discussed today.  Please schedule followup in 6 months, call sooner if problems.

## 2013-09-28 NOTE — Assessment & Plan Note (Signed)
On metformin - The current medical regimen (metformin) is effective;  continue present plan and medications. Check a1c q51mo -adjust as needed Lab Results  Component Value Date   HGBA1C 6.2 05/29/2013

## 2013-09-28 NOTE — Progress Notes (Signed)
Subjective:    Patient ID: [name], female    DOB: 1931/01/25, 80 y.o.   MRN: [id number]  HPI  Patient is here for follow up  Reviewed chronic medical issues and interval medical events  Past Medical History  Diagnosis Date  . S/P CABG x 1   . S/P inguinal hernia repair   . TUBULOVILLOUS ADENOMA, 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    colo 04/2012 - no polyps - no further screening planned (age >70)  . CAD, NATIVE VESSEL 06/09/2008  . UNSPECIFIED ANEMIA   . DIABETES MELLITUS, TYPE II   . GERD   . HYPERLIPIDEMIA   . HYPERTENSION   . HIATAL HERNIA   . FIBROMYALGIA   . OA (osteoarthritis)     severe, shoulders, hands - inflammatory, ?RA  . Gout   . Insomnia     Review of Systems  Constitutional: Positive for appetite change and fatigue. Negative for fever and unexpected weight change (20# since 12/2012, s/p nutritionist input).  Respiratory: Negative for cough and shortness of breath.   Cardiovascular: Negative for chest pain and leg swelling.  Gastrointestinal: Positive for constipation (chronic, stopped iron because of same).  Musculoskeletal: Positive for arthralgias and back pain. Negative for joint swelling.       Objective:   Physical Exam  BP 112/62  Pulse 59  Temp(Src) 98.3 F (36.8 C) (Oral)  Wt 147 lb 3.2 oz (66.769 kg)  SpO2 96% Wt Readings from Last 3 Encounters:  09/28/13 147 lb 3.2 oz (66.769 kg)  07/27/13 154 lb 6.4 oz (70.035 kg)  06/09/13 158 lb (71.668 kg)   Constitutional: She appears well-developed and well-nourished. No distress.  Neck: Normal range of motion. Neck supple. No JVD present. No thyromegaly present.  Cardiovascular: Normal rate, regular rhythm and normal heart sounds.  No murmur heard. No BLE edema. Pulmonary/Chest: Effort normal and breath sounds normal. No respiratory distress. She has no wheezes.  Psychiatric: She has a normal mood and affect. Her behavior is normal. Judgment and thought content normal.   Lab Results  Component Value  Date   WBC 6.9 06/09/2013   HGB 9.7* 06/09/2013   HCT 31.4* 06/09/2013   PLT 314.0 06/09/2013   GLUCOSE 95 06/09/2013   CHOL 182 12/22/2012   TRIG 51.0 12/22/2012   HDL 97.50 12/22/2012   LDLDIRECT 115.3 09/30/2009   LDLCALC 74 12/22/2012   ALT 18 06/09/2013   AST 27 06/09/2013   NA 131* 06/09/2013   K 4.5 06/09/2013   CL 96 06/09/2013   CREATININE 0.8 06/09/2013   BUN 6 06/09/2013   CO2 28 06/09/2013   TSH 0.77 06/09/2013   INR 0.99 11/06/2010   HGBA1C 6.2 05/29/2013   MICROALBUR 3.4* 12/22/2012    Dg Chest 2 View  06/09/2013   CLINICAL DATA:  Cough, congestion, former smoker, history TB, diabetes, coronary artery disease post CABG, hypertension  EXAM: CHEST  2 VIEW  COMPARISON:  11/06/2010  FINDINGS: Normal heart size post CABG.  Calcification in mild elongation of thoracic aorta.  Mediastinal contours and pulmonary vascularity otherwise normal.  Minimal linear scarring left base.  Bronchitic changes without infiltrate, pleural effusion or pneumothorax.  Bilateral shoulder prostheses.  IMPRESSION: Post CABG.  Bronchitic changes with minimal left base scarring.   Electronically Signed   By: Lavonia Dana M.D.   On: 06/09/2013 17:32       Assessment & Plan:   Problem List Items Addressed This Visit   DIABETES MELLITUS, TYPE II - Primary  On metformin - The current medical regimen (metformin) is effective;  continue present plan and medications. Check a1c q57mo -adjust as needed Lab Results  Component Value Date   HGBA1C 6.2 05/29/2013      Relevant Orders      Hemoglobin A1c      Basic metabolic panel   HYPERTENSION      Fair control - continue ongoing medical therapy as ongoing  BP Readings from Last 3 Encounters:  09/28/13 112/62  06/09/13 122/70  05/22/13 142/68      UNSPECIFIED ANEMIA     Colo 04/2012 with diverticulosis, no mass/polyp Recheck CBC with ferritin now Given weight loss (intentional? Reports no appetite but trying to eat less), if progressive decline or iron def,  consider EGD Changed nexium to omeprazole because of cost and takes PPI daily Discussed again GI/bleeding risk of chronic NSAIDs -  pt understands risks and wishes to continue same Feel risk<benefit given disabling joint pain flares when off meloxicam -  Not on iron because of constipation     Relevant Orders      CBC with Differential      Ferritin

## 2013-09-28 NOTE — Assessment & Plan Note (Signed)
Colo 04/2012 with diverticulosis, no mass/polyp Recheck CBC with ferritin now Given weight loss (intentional? Reports no appetite but trying to eat less), if progressive decline or iron def, consider EGD Changed nexium to omeprazole because of cost and takes PPI daily Discussed again GI/bleeding risk of chronic NSAIDs -  pt understands risks and wishes to continue same Feel risk<benefit given disabling joint pain flares when off meloxicam -  Not on iron because of constipation

## 2013-10-17 ENCOUNTER — Other Ambulatory Visit: Payer: Self-pay | Admitting: *Deleted

## 2013-10-17 MED ORDER — CARVEDILOL 6.25 MG PO TABS: ORAL_TABLET | ORAL | Status: DC

## 2013-10-17 MED ORDER — ATORVASTATIN CALCIUM 40 MG PO TABS: ORAL_TABLET | ORAL | Status: DC

## 2013-10-17 MED ORDER — RAMIPRIL 5 MG PO CAPS: 5.0000 mg | ORAL_CAPSULE | Freq: Every day | ORAL | Status: DC

## 2013-10-17 NOTE — Telephone Encounter (Signed)
Left msg on triage needing med sent to mail serive. Called pt to verify which med no answer LMOM RTC...[name]

## 2013-10-17 NOTE — Telephone Encounter (Signed)
Pt return call abck she is needing ramipril, generic coreg, and lipitor. Inform pt will send to [facility]...Johny Chess

## 2013-10-18 DIAGNOSIS — E1139 Type 2 diabetes mellitus with other diabetic ophthalmic complication: Secondary | ICD-10-CM | POA: Diagnosis not present

## 2013-10-20 ENCOUNTER — Other Ambulatory Visit: Payer: Self-pay | Admitting: Family Medicine

## 2013-10-20 ENCOUNTER — Other Ambulatory Visit: Payer: Self-pay | Admitting: Internal Medicine

## 2013-10-23 ENCOUNTER — Other Ambulatory Visit: Payer: Self-pay

## 2013-10-23 MED ORDER — ALPRAZOLAM 0.25 MG PO TABS: 0.2500 mg | ORAL_TABLET | Freq: Three times a day (TID) | ORAL | Status: DC | PRN

## 2013-10-23 NOTE — Telephone Encounter (Signed)
Refill done.  

## 2013-10-23 NOTE — Telephone Encounter (Signed)
Faxed script back to [facility].../lmb 

## 2013-11-04 ENCOUNTER — Emergency Department (HOSPITAL_COMMUNITY)
Admission: EM | Admit: 2013-11-04 | Discharge: 2013-11-05 | Disposition: A | Discharge status: Home / Self Care | Payer: Medicare Other | Attending: Emergency Medicine | Admitting: Emergency Medicine

## 2013-11-04 ENCOUNTER — Encounter (HOSPITAL_COMMUNITY): Payer: Self-pay | Admitting: Emergency Medicine

## 2013-11-04 DIAGNOSIS — IMO0001 Reserved for inherently not codable concepts without codable children: Secondary | ICD-10-CM | POA: Insufficient documentation

## 2013-11-04 DIAGNOSIS — M545 Low back pain, unspecified: Secondary | ICD-10-CM | POA: Diagnosis not present

## 2013-11-04 DIAGNOSIS — IMO0002 Reserved for concepts with insufficient information to code with codable children: Secondary | ICD-10-CM | POA: Diagnosis not present

## 2013-11-04 DIAGNOSIS — I2510 Atherosclerotic heart disease of native coronary artery without angina pectoris: Secondary | ICD-10-CM | POA: Diagnosis not present

## 2013-11-04 DIAGNOSIS — M1990 Unspecified osteoarthritis, unspecified site: Secondary | ICD-10-CM | POA: Diagnosis not present

## 2013-11-04 DIAGNOSIS — E119 Type 2 diabetes mellitus without complications: Secondary | ICD-10-CM | POA: Diagnosis not present

## 2013-11-04 DIAGNOSIS — M4800 Spinal stenosis, site unspecified: Secondary | ICD-10-CM | POA: Diagnosis not present

## 2013-11-04 DIAGNOSIS — M109 Gout, unspecified: Secondary | ICD-10-CM | POA: Insufficient documentation

## 2013-11-04 DIAGNOSIS — Z85038 Personal history of other malignant neoplasm of large intestine: Secondary | ICD-10-CM | POA: Insufficient documentation

## 2013-11-04 DIAGNOSIS — Z791 Long term (current) use of non-steroidal anti-inflammatories (NSAID): Secondary | ICD-10-CM | POA: Insufficient documentation

## 2013-11-04 DIAGNOSIS — Z79899 Other long term (current) drug therapy: Secondary | ICD-10-CM | POA: Diagnosis not present

## 2013-11-04 DIAGNOSIS — Z951 Presence of aortocoronary bypass graft: Secondary | ICD-10-CM | POA: Diagnosis not present

## 2013-11-04 DIAGNOSIS — K219 Gastro-esophageal reflux disease without esophagitis: Secondary | ICD-10-CM | POA: Insufficient documentation

## 2013-11-04 DIAGNOSIS — G8929 Other chronic pain: Secondary | ICD-10-CM | POA: Diagnosis not present

## 2013-11-04 DIAGNOSIS — Z7982 Long term (current) use of aspirin: Secondary | ICD-10-CM | POA: Diagnosis not present

## 2013-11-04 DIAGNOSIS — Z87891 Personal history of nicotine dependence: Secondary | ICD-10-CM | POA: Diagnosis not present

## 2013-11-04 DIAGNOSIS — D649 Anemia, unspecified: Secondary | ICD-10-CM | POA: Diagnosis not present

## 2013-11-04 DIAGNOSIS — Z9889 Other specified postprocedural states: Secondary | ICD-10-CM | POA: Insufficient documentation

## 2013-11-04 DIAGNOSIS — M549 Dorsalgia, unspecified: Secondary | ICD-10-CM | POA: Diagnosis not present

## 2013-11-04 DIAGNOSIS — I10 Essential (primary) hypertension: Secondary | ICD-10-CM | POA: Insufficient documentation

## 2013-11-04 LAB — BASIC METABOLIC PANEL
Anion gap: 17 — ABNORMAL HIGH (ref 5–15)
BUN: 9 mg/dL (ref 6–23)
CO2: 24 mEq/L (ref 19–32)
Calcium: 8.7 mg/dL (ref 8.4–10.5)
Chloride: 95 mEq/L — ABNORMAL LOW (ref 96–112)
Creatinine, Ser: 0.69 mg/dL (ref 0.50–1.10)
GFR calc Af Amer: >90 mL/min (ref >90)
GFR calc non Af Amer: 79 mL/min — ABNORMAL LOW (ref >90)
Glucose, Bld: 107 mg/dL — ABNORMAL HIGH (ref 70–99)
Potassium: 3.5 mEq/L — ABNORMAL LOW (ref 3.7–5.3)
Sodium: 136 mEq/L — ABNORMAL LOW (ref 137–147)

## 2013-11-04 LAB — CBC
HCT: 29.5 % — ABNORMAL LOW (ref 36.0–46.0)
Hemoglobin: 8.9 g/dL — ABNORMAL LOW (ref 12.0–15.0)
MCH: 23.2 pg — ABNORMAL LOW (ref 26.0–34.0)
MCHC: 30.2 g/dL (ref 30.0–36.0)
MCV: 77 fL — ABNORMAL LOW (ref 78.0–100.0)
Platelets: 222 10*3/uL (ref 150–400)
RBC: 3.83 MIL/uL — ABNORMAL LOW (ref 3.87–5.11)
RDW: 17.6 % — ABNORMAL HIGH (ref 11.5–15.5)
WBC: 10.4 10*3/uL (ref 4.0–10.5)

## 2013-11-04 MED ORDER — OXYCODONE-ACETAMINOPHEN 5-325 MG PO TABS
1.0000 | ORAL_TABLET | Freq: Once | ORAL | Status: AC
  Administered 2013-11-04: 1 via ORAL
  Filled 2013-11-04: qty 1

## 2013-11-04 MED ORDER — KETOROLAC TROMETHAMINE 10 MG PO TABS: 10.0000 mg | ORAL_TABLET | Freq: Four times a day (QID) | ORAL | Status: DC | PRN

## 2013-11-04 MED ORDER — HYDROCODONE-ACETAMINOPHEN 5-325 MG PO TABS: 1.0000 | ORAL_TABLET | ORAL | Status: DC | PRN

## 2013-11-04 MED ORDER — KETOROLAC TROMETHAMINE 15 MG/ML IJ SOLN: 15.0000 mg | Freq: Once | INTRAMUSCULAR | Status: DC

## 2013-11-04 MED ORDER — ONDANSETRON 4 MG PO TBDP
8.0000 mg | ORAL_TABLET | Freq: Once | ORAL | Status: AC
  Administered 2013-11-04: 8 mg via ORAL
  Filled 2013-11-04: qty 2

## 2013-11-04 MED ORDER — KETOROLAC TROMETHAMINE 15 MG/ML IJ SOLN
15.0000 mg | Freq: Once | INTRAMUSCULAR | Status: AC
  Administered 2013-11-04: 15 mg via INTRAMUSCULAR
  Filled 2013-11-04: qty 1

## 2013-11-04 NOTE — ED Notes (Signed)
The pt had a tramadol earlier today which made her drowsy.  Her daughters at the bedside are not aware that she has been working in her flower bed and that probably caused the pain,  She does not want them to know.  She gets injections in her spine by the spine doctor and her last one was 2-3 months ago

## 2013-11-04 NOTE — ED Notes (Signed)
The pt arrived by ptar from home.  She has chronic back pain spinal stenosis.  The past 2 days she has been over doing it.  She has been working in her flowers.  She has had more pain for 2 days.  Alert no distress

## 2013-11-04 NOTE — Discharge Instructions (Signed)

## 2013-11-04 NOTE — ED Notes (Signed)
Dr. Azzie Roup at bedside.

## 2013-11-04 NOTE — ED Provider Notes (Signed)
CSN: [id number]     Arrival date & time 11/04/13  2002 History   First MD Initiated Contact with Patient 11/04/13 2120     Chief Complaint  Patient presents with  . Back Pain     (Consider location/radiation/quality/duration/timing/severity/associated sxs/prior Treatment) Patient is a 80 y.o. female presenting with back pain.  Back Pain Location:  Lumbar spine Quality:  Stabbing Pain severity:  Severe Pain is:  Worse during the night Onset quality:  Gradual Associated symptoms: no abdominal pain, no chest pain, no dysuria and no headaches     Past Medical History  Diagnosis Date  . S/P CABG x 1   . S/P inguinal hernia repair   . TUBULOVILLOUS ADENOMA, 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    colo 04/2012 - no polyps - no further screening planned (age >11)  . CAD, NATIVE VESSEL 06/09/2008  . UNSPECIFIED ANEMIA   . DIABETES MELLITUS, TYPE II   . GERD   . HYPERLIPIDEMIA   . HYPERTENSION   . HIATAL HERNIA   . FIBROMYALGIA   . OA (osteoarthritis)     severe, shoulders, hands - inflammatory, ?RA  . Gout   . Insomnia    Past Surgical History  Procedure Laterality Date  . Hernia repair    . Abdominal hysterectomy  1981  . Coronary artery bypass graft  2010  . Appendectomy    . Replacement total knee bilateral    . Rotator cuff repair      Left  . Ovarian cyst removal    . Cataract extraction, bilateral    . Joint replacement    . Reverse shoulder arthroplasty  09/08/2011    Procedure: REVERSE SHOULDER ARTHROPLASTY;  Surgeon: Nita Sells, MD;  Location: [place];  Service: Orthopedics;  Laterality: Left;  left reverse total shoulder arthroplasty   Family History  Problem Relation Age of Onset  . Cancer Mother     Stomach  . Stomach cancer Mother 51  . Heart disease Father 61    MI  . Diabetes Sister   . Heart disease Sister   . Heart attack Brother   . Anesthesia problems Neg Hx   . Colon cancer Neg Hx    History  Substance Use Topics  . Smoking status: Former Smoker     Quit date: 04/26/1970  . 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 Alcohol Use: 0.6 oz/week    1 Glasses of wine per week   OB History   Grav Para Term Preterm Abortions TAB SAB Ect Mult Living                 Review of Systems  Constitutional: Negative for activity change.  HENT: Negative for congestion.   Respiratory: Negative for cough and shortness of breath.   Cardiovascular: Negative for chest pain and leg swelling.  Gastrointestinal: Negative for nausea, vomiting, abdominal pain, diarrhea, constipation, blood in stool and abdominal distention.  Genitourinary: Negative for dysuria, flank pain and vaginal discharge.  Musculoskeletal: Positive for back pain.  Skin: Negative for color change.  Neurological: Negative for syncope and headaches.  Psychiatric/Behavioral: Negative for agitation.      Allergies  Review of patient's allergies indicates no known allergies.  Home Medications   Prior to Admission medications   Medication Sig Start Date End Date Taking? Authorizing Provider  ALPRAZolam Duanne Moron) 0.25 MG tablet Take 0.25 mg by mouth daily as needed for anxiety.  Yes Historical Provider, MD  amLODipine (NORVASC) 5 MG tablet Take 5 mg by mouth daily.   Yes Historical Provider, MD  aspirin EC 81 MG tablet Take 81 mg by mouth daily.   Yes Historical Provider, MD  atorvastatin (LIPITOR) 40 MG tablet Take 40 mg by mouth at bedtime.   Yes Historical Provider, MD  carvedilol (COREG) 6.25 MG tablet Take 6.25 mg by mouth 2 (two) times daily with a meal.   Yes Historical Provider, MD  cetirizine (ZYRTEC) 10 MG tablet Take 10 mg by mouth at bedtime.   Yes Historical Provider, MD  Cyanocobalamin (VITAMIN B-12 PO) Take 1 tablet by mouth daily.   Yes Historical Provider, MD  meloxicam (MOBIC) 7.5 MG tablet Take 7.5 mg by mouth daily.   Yes Historical Provider, MD  metFORMIN (GLUCOPHAGE-XR) 500 MG 24 hr tablet Take 500  mg by mouth daily with breakfast.   Yes Historical Provider, MD  Multiple Vitamin (MULITIVITAMIN WITH MINERALS) TABS Take 1 tablet by mouth daily.   Yes Historical Provider, MD  omeprazole (PRILOSEC) 40 MG capsule Take 1 capsule (40 mg total) by mouth daily. 05/10/13  Yes Rowe Clack, MD  polyethylene glycol (MIRALAX / GLYCOLAX) packet Take 17 g by mouth 2 (two) times daily. 03/24/13  Yes Rowe Clack, MD  ramipril (ALTACE) 5 MG capsule Take 1 capsule (5 mg total) by mouth daily. 10/17/13  Yes Rowe Clack, MD  senna (SENOKOT) 8.6 MG TABS Take 1 tablet by mouth at bedtime.    Yes Historical Provider, MD  traMADol (ULTRAM) 50 MG tablet Take 50 mg by mouth 2 (two) times daily as needed (pain).   Yes Historical Provider, MD  VITAMIN E PO Take 1 capsule by mouth daily.   Yes Historical Provider, MD   BP 153/78  Pulse 84  Temp(Src) 98.6 F (37 C) (Oral)  Resp 16  SpO2 97% Physical Exam  Constitutional: She is oriented to person, place, and time. She appears well-developed.  HENT:  Head: Normocephalic.  Eyes: Pupils are equal, round, and reactive to light.  Neck: Neck supple.  Cardiovascular: Normal rate.  Exam reveals no gallop and no friction rub.   No murmur heard. Pulmonary/Chest: Effort normal and breath sounds normal. No respiratory distress.  Abdominal: Soft. She exhibits no distension. There is no tenderness. There is no rebound.  Musculoskeletal: She exhibits no edema.  Neurological: She is alert and oriented to person, place, and time.  Skin: Skin is warm.  Psychiatric: She has a normal mood and affect.   Cranial nerves III-XII grossly intact Strength 5+/5+ to upper and lower extremities bilaterally with resistance applied, equal distribution noted Strength intact to MCP, PIP, DIP joints of  hand Negative arm drift Fine motor skills intact Heel to knee down shin normal bilaterally Gait proper, proper balance - negative sway, negative drift, negative  step-offs   ED Course  Procedures (including critical care time) Labs Review Labs Reviewed  CBC - Abnormal; Notable for the following:    RBC 3.83 (*)    Hemoglobin 8.9 (*)    HCT 29.5 (*)    MCV 77.0 (*)    MCH 23.2 (*)    RDW 17.6 (*)    All other components within normal limits  BASIC METABOLIC PANEL - Abnormal; Notable for the following:    Sodium 136 (*)    Potassium 3.5 (*)    Chloride 95 (*)    Glucose, Bld 107 (*)    GFR calc non  Af Amer 79 (*)    Anion gap 17 (*)    All other components within normal limits    Imaging Review No results found.   EKG Interpretation None      MDM   Final diagnoses:  Midline low back pain without sciatica   80 year old female with notable spinal stenosis. Patient states that she's been having increasing back pain. Patient has had injections in the pain clinic have been very helpful however after working in her garden started to have uncontrollable pain. Patient was unable to contact her pain clinic because it was the weekend. Patient does not currently have any red flags for back pain. No fevers chills urinary retention and incontinence no saddle anesthesia. Patient does not have point tenderness along her spine.  Patient given Toradol in the department and one Norco. This regimen assistant the patient's pain significantly and she felt that she was appropriate for discharge. Patient requests a least one week's worth of pain medication because that is the timeframe until she can see pain management again. Patient is currently only on tramadol at home. Patient evaluated with CBC and BMP in creatinine was within normal limits. Toradol was ineffective in the department the patient was given a prescription for this at home. Patient was also given a prescription for Norco.  Patient's daughter is a physician and full plan was discussed with her. She agrees with the disposition the patient was discharged home    Claudean Severance, MD 11/06/13  [phone]

## 2013-11-04 NOTE — ED Notes (Signed)
Pain med given 

## 2013-11-09 NOTE — ED Provider Notes (Signed)
I saw and evaluated the patient, reviewed the resident's note and I agree with the findings and plan.   EKG Interpretation None      80 year old female presenting with her daughter for evaluation of back pain. Chronic in nature. No "red flags" identified. Patient is requesting prescription for pain medication. I feel that this is reasonable until she can further followup as an outpatient.. Return precautions were discussed with patient and her daughter. Outpatient followup otherwise.  Virgel Manifold, MD 11/09/13 [phone]

## 2013-11-20 ENCOUNTER — Other Ambulatory Visit: Payer: Self-pay | Admitting: Internal Medicine

## 2013-11-29 ENCOUNTER — Emergency Department (HOSPITAL_COMMUNITY)
Admission: EM | Admit: 2013-11-29 | Discharge: 2013-11-29 | Disposition: A | Discharge status: Home / Self Care | Payer: Medicare Other | Attending: Emergency Medicine | Admitting: Emergency Medicine

## 2013-11-29 ENCOUNTER — Encounter (HOSPITAL_COMMUNITY): Payer: Self-pay | Admitting: Emergency Medicine

## 2013-11-29 DIAGNOSIS — Z79899 Other long term (current) drug therapy: Secondary | ICD-10-CM | POA: Diagnosis not present

## 2013-11-29 DIAGNOSIS — Z860100 Personal history of colon polyps, unspecified: Secondary | ICD-10-CM | POA: Insufficient documentation

## 2013-11-29 DIAGNOSIS — M109 Gout, unspecified: Secondary | ICD-10-CM | POA: Insufficient documentation

## 2013-11-29 DIAGNOSIS — I10 Essential (primary) hypertension: Secondary | ICD-10-CM | POA: Diagnosis not present

## 2013-11-29 DIAGNOSIS — M25579 Pain in unspecified ankle and joints of unspecified foot: Secondary | ICD-10-CM | POA: Diagnosis not present

## 2013-11-29 DIAGNOSIS — E119 Type 2 diabetes mellitus without complications: Secondary | ICD-10-CM | POA: Insufficient documentation

## 2013-11-29 DIAGNOSIS — M79609 Pain in unspecified limb: Secondary | ICD-10-CM | POA: Diagnosis not present

## 2013-11-29 DIAGNOSIS — E785 Hyperlipidemia, unspecified: Secondary | ICD-10-CM | POA: Insufficient documentation

## 2013-11-29 DIAGNOSIS — Z87891 Personal history of nicotine dependence: Secondary | ICD-10-CM | POA: Insufficient documentation

## 2013-11-29 DIAGNOSIS — M10071 Idiopathic gout, right ankle and foot: Secondary | ICD-10-CM

## 2013-11-29 DIAGNOSIS — K219 Gastro-esophageal reflux disease without esophagitis: Secondary | ICD-10-CM | POA: Diagnosis not present

## 2013-11-29 DIAGNOSIS — I2510 Atherosclerotic heart disease of native coronary artery without angina pectoris: Secondary | ICD-10-CM | POA: Diagnosis not present

## 2013-11-29 DIAGNOSIS — R6889 Other general symptoms and signs: Secondary | ICD-10-CM | POA: Diagnosis not present

## 2013-11-29 DIAGNOSIS — Z7982 Long term (current) use of aspirin: Secondary | ICD-10-CM | POA: Diagnosis not present

## 2013-11-29 DIAGNOSIS — Z951 Presence of aortocoronary bypass graft: Secondary | ICD-10-CM | POA: Insufficient documentation

## 2013-11-29 MED ORDER — COLCHICINE 0.6 MG PO TABS
0.6000 mg | ORAL_TABLET | Freq: Once | ORAL | Status: AC
  Administered 2013-11-29: 0.6 mg via ORAL
  Filled 2013-11-29: qty 1

## 2013-11-29 MED ORDER — OXYCODONE-ACETAMINOPHEN 2.5-325 MG PO TABS: 1.0000 | ORAL_TABLET | ORAL | Status: DC | PRN

## 2013-11-29 MED ORDER — OXYCODONE-ACETAMINOPHEN 5-325 MG PO TABS
1.0000 | ORAL_TABLET | Freq: Once | ORAL | Status: AC
  Administered 2013-11-29: 1 via ORAL
  Filled 2013-11-29: qty 1

## 2013-11-29 NOTE — ED Notes (Signed)
Per EMS, pt has hx of gout. Began having pain in her rt foot yesterday morning.

## 2013-11-29 NOTE — ED Provider Notes (Signed)
CSN: [id number]     Arrival date & time 11/29/13  0443 History   First MD Initiated Contact with Patient 11/29/13 [phone]     Chief Complaint  Patient presents with  . Gout   HPI This 80 yo woman with a PMH of CAD, DMII, HLD, HTN, OA and 2 flares of gout (first in 2009) is here with a one day history of pain in her right ankle. The pain is sharp and undulating and has gotten worse since its onset; she now rates it at a 15/10 in intensity. The patient does take meloxicam 7.5 mg daily (down from 15 mg in 2009, decreased due to some symptoms of GERD). She believes her first two flares included one in her left elbow and one in her right great toe. Her diet is well-balanced, with many fruits and vegetables and she drinks one glass of wine per week. She denies trauma to her lower extremities.  Past Medical History  Diagnosis Date  . S/P CABG x 1   . S/P inguinal hernia repair   . TUBULOVILLOUS ADENOMA, 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    colo 04/2012 - no polyps - no further screening planned (age >34)  . CAD, NATIVE VESSEL 06/09/2008  . UNSPECIFIED ANEMIA   . DIABETES MELLITUS, TYPE II   . GERD   . HYPERLIPIDEMIA   . HYPERTENSION   . HIATAL HERNIA   . FIBROMYALGIA   . OA (osteoarthritis)     severe, shoulders, hands - inflammatory, ?RA  . Gout   . Insomnia    Past Surgical History  Procedure Laterality Date  . Hernia repair    . Abdominal hysterectomy  1981  . Coronary artery bypass graft  2010  . Appendectomy    . Replacement total knee bilateral    . Rotator cuff repair      Left  . Ovarian cyst removal    . Cataract extraction, bilateral    . Joint replacement    . Reverse shoulder arthroplasty  09/08/2011    Procedure: REVERSE SHOULDER ARTHROPLASTY;  Surgeon: Nita Sells, MD;  Location: [place];  Service: Orthopedics;  Laterality: Left;  left reverse total shoulder arthroplasty   Family History  Problem Relation Age of Onset  . Cancer Mother     Stomach  . Stomach cancer Mother 6   . Heart disease Father 69    MI  . Diabetes Sister   . Heart disease Sister   . Heart attack Brother   . Anesthesia problems Neg Hx   . Colon cancer Neg Hx    History  Substance Use Topics  . Smoking status: Former Smoker    Quit date: 04/26/1970  . 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 Alcohol Use: 0.6 oz/week    1 Glasses of wine per week   OB History   Grav Para Term Preterm Abortions TAB SAB Ect Mult Living                 Review of Systems  All other systems reviewed and are negative.  General: no decreased energy or recent illnesses Skin: no rashes, redness or lesions HEENT: no headaches Cardiac: no chest pain or palpitations Respiratory: no SOB GI: chronic constipation, well-managed Urinary: no dysuria, hematuria or frequency; has had some difficulty getting to the bathroom given her lower extremity pain Msk: see HPI, chronic back pain managed with interthecal spinal injections (due for  one 8/18, last in 07/2013), chronic right knee pain (last knee replacement 20 years ago) Endocrine: no temperature intolerance, no weight changes Psychiatric: no history of anxiety/depression   Allergies  Review of patient's allergies indicates no known allergies.  Home Medications   Prior to Admission medications   Medication Sig Start Date End Date Taking? Authorizing Provider  ALPRAZolam Duanne Moron) 0.25 MG tablet Take 0.25 mg by mouth daily as needed for anxiety.    Historical Provider, MD  amLODipine (NORVASC) 5 MG tablet Take 5 mg by mouth daily.    Historical Provider, MD  aspirin EC 81 MG tablet Take 81 mg by mouth daily.    Historical Provider, MD  atorvastatin (LIPITOR) 40 MG tablet Take 40 mg by mouth at bedtime.    Historical Provider, MD  carvedilol (COREG) 6.25 MG tablet Take 6.25 mg by mouth 2 (two) times daily with a meal.    Historical Provider, MD  cetirizine (ZYRTEC) 10 MG tablet Take 10 mg by  mouth at bedtime.    Historical Provider, MD  Cyanocobalamin (VITAMIN B-12 PO) Take 1 tablet by mouth daily.    Historical Provider, MD  HYDROcodone-acetaminophen (NORCO/VICODIN) 5-325 MG per tablet Take 1 tablet by mouth every 4 (four) hours as needed for moderate pain or severe pain. 11/04/13   Claudean Severance, MD  ketorolac (TORADOL) 10 MG tablet Take 1 tablet (10 mg total) by mouth every 6 (six) hours as needed. 11/04/13   Claudean Severance, MD  meloxicam (MOBIC) 7.5 MG tablet Take 7.5 mg by mouth daily.    Historical Provider, MD  metFORMIN (GLUCOPHAGE-XR) 500 MG 24 hr tablet Take 500 mg by mouth daily with breakfast.    Historical Provider, MD  Multiple Vitamin (MULITIVITAMIN WITH MINERALS) TABS Take 1 tablet by mouth daily.    Historical Provider, MD  omeprazole (PRILOSEC) 40 MG capsule Take 1 capsule (40 mg total) by mouth daily. 05/10/13   Rowe Clack, MD  polyethylene glycol (MIRALAX / GLYCOLAX) packet Take 17 g by mouth 2 (two) times daily. 03/24/13   Rowe Clack, MD  ramipril (ALTACE) 5 MG capsule Take 1 capsule (5 mg total) by mouth daily. 10/17/13   Rowe Clack, MD  senna (SENOKOT) 8.6 MG TABS Take 1 tablet by mouth at bedtime.     Historical Provider, MD  traMADol (ULTRAM) 50 MG tablet Take 50 mg by mouth 2 (two) times daily as needed (pain).    Historical Provider, MD  VITAMIN E PO Take 1 capsule by mouth daily.    Historical Provider, MD  zolpidem (AMBIEN) 5 MG tablet TAKE 1 TABLET BY MOUTH AT BEDTIME AS NEEDED FOR SLEEP 11/20/13   Rowe Clack, MD   BP 151/120  Pulse 86  Temp(Src) 98.7 F (37.1 C) (Oral)  Resp 18  Ht 4\' 11"  (1.499 m)  Wt 154 lb (69.854 kg)  BMI 31.09 kg/m2  SpO2 100% Physical Exam Appearance: in wheelchair holding right leg up with a blanket, very sensitive to movement of the right leg HEENT: AT/[place], PERRL, EOMi Heart: RRR, normal S1S2, no MRG Lungs: CTAB, no WRR Abdomen: BS+, nontender Musculoskeletal: right ankle edema,  laterally>medially, no appreciable erythema (but patient is African-American and has dark enough skin that this could be difficult to appreciate), warm to palpation, very tender to palpation, none of these symptoms present in left ankle, either great toe or either knee, palpable pulses in LE b/l Extremities: no other edema, no calf tenderness Neurologic: sensation intact throughout, strength 5/5  throughout (though patient is guarding during strength exam of right LE) Skin: no rashes or lesions   ED Course  Procedures (including critical care time) Labs Review Labs Reviewed - No data to display  Imaging Review No results found.   EKG Interpretation None      MDM   Final diagnoses:  None    This 80 yo woman appears to be having a repeat gout flare in her right ankle. She is on low-dose mobic at home but will be given low dose colchicine (0.6mg ) and percocet to control her pain. She lives alone, so will be observed until her pain is well-controlled enough for her to move around without fall risk at home.    Drucilla Schmidt, MD 12/06/13 [phone]

## 2013-11-29 NOTE — Discharge Instructions (Signed)
Take the percocet as needed for pain in the next few days. Please follow-up with your primary doctor in the next 2 weeks to determine the plan for prevention of gout in the future.   Gout Gout is when your joints become red, sore, and swell (inflamed). This is caused by the buildup of uric acid crystals in the joints. Uric acid is a chemical that is normally in the blood. If the level of uric acid gets too high in the blood, these crystals form in your joints and tissues. Over time, these crystals can form into masses near the joints and tissues. These masses can destroy bone and cause the bone to look misshapen (deformed). HOME CARE   Do not take aspirin for pain.  Only take medicine as told by your doctor.  Rest the joint as much as you can. When in bed, keep sheets and blankets off painful areas.  Keep the sore joints raised (elevated).  Put warm or cold packs on painful joints. Use of warm or cold packs depends on which works best for you.  Use crutches if the painful joint is in your leg.  Drink enough fluids to keep your pee (urine) clear or pale yellow. Limit alcohol, sugary drinks, and drinks with fructose in them.  Follow your diet instructions. Pay careful attention to how much protein you eat. Include fruits, vegetables, whole grains, and fat-free or low-fat milk products in your daily diet. Talk to your doctor or dietitian about the use of coffee, vitamin C, and cherries. These may help lower uric acid levels.  Keep a healthy body weight. GET HELP RIGHT AWAY IF:   You have watery poop (diarrhea), throw up (vomit), or have any side effects from medicines.  You do not feel better in 24 hours, or you are getting worse.  Your joint becomes suddenly more tender, and you have chills or a fever. MAKE SURE YOU:   Understand these instructions.  Will watch your condition.  Will get help right away if you are not doing well or get worse. Document Released: 01/14/2008 Document  Revised: 08/21/2013 Document Reviewed: 11/18/2011 [facility] Patient Information 2015 [place], [place]. This information is not intended to replace advice given to you by your health care provider. Make sure you discuss any questions you have with your health care provider.

## 2013-11-29 NOTE — ED Provider Notes (Signed)
Patient seen/examined in the Emergency Department in conjunction with Resident Physician Provider [facility] Patient reports right foot pain with h/o gout Exam : diffuse tenderness to right foot/ankle.  Distal pulses intact.  No evidence of DVT Plan: treat pain and reassess    Sharyon Cable, MD 11/29/13 [phone]

## 2013-12-05 ENCOUNTER — Telehealth: Payer: Self-pay | Admitting: Internal Medicine

## 2013-12-05 ENCOUNTER — Ambulatory Visit: Payer: Medicare Other | Admitting: Internal Medicine

## 2013-12-05 DIAGNOSIS — M5430 Sciatica, unspecified side: Secondary | ICD-10-CM | POA: Diagnosis not present

## 2013-12-05 DIAGNOSIS — M48062 Spinal stenosis, lumbar region with neurogenic claudication: Secondary | ICD-10-CM | POA: Diagnosis not present

## 2013-12-05 DIAGNOSIS — R4689 Other symptoms and signs involving appearance and behavior: Secondary | ICD-10-CM | POA: Insufficient documentation

## 2013-12-05 DIAGNOSIS — Z0289 Encounter for other administrative examinations: Secondary | ICD-10-CM

## 2013-12-05 DIAGNOSIS — M4716 Other spondylosis with myelopathy, lumbar region: Secondary | ICD-10-CM | POA: Diagnosis not present

## 2013-12-05 NOTE — Telephone Encounter (Signed)
Patient no showed for hospital fu today 12/05/2013 with Dr. Linna Darner.  Next appt with Dr. Asa Lente is 10/08.  Please advise.

## 2013-12-06 NOTE — ED Provider Notes (Signed)
I have personally seen and examined the patient.  I have discussed the plan of care with the resident.  I have reviewed the documentation on PMH/FH/Soc. History.  I have reviewed the documentation of the resident and agree.   Sharyon Cable, MD 12/06/13 [phone]

## 2013-12-12 ENCOUNTER — Ambulatory Visit: Payer: Medicare Other | Admitting: Internal Medicine

## 2013-12-13 ENCOUNTER — Ambulatory Visit (INDEPENDENT_AMBULATORY_CARE_PROVIDER_SITE_OTHER): Payer: Medicare Other | Admitting: Internal Medicine

## 2013-12-13 ENCOUNTER — Other Ambulatory Visit (INDEPENDENT_AMBULATORY_CARE_PROVIDER_SITE_OTHER): Payer: Medicare Other

## 2013-12-13 ENCOUNTER — Encounter: Payer: Self-pay | Admitting: Internal Medicine

## 2013-12-13 VITALS — BP 150/66 | HR 65 | Temp 98.2°F | Wt 143.0 lb

## 2013-12-13 DIAGNOSIS — M109 Gout, unspecified: Secondary | ICD-10-CM | POA: Diagnosis not present

## 2013-12-13 DIAGNOSIS — M10071 Idiopathic gout, right ankle and foot: Secondary | ICD-10-CM

## 2013-12-13 DIAGNOSIS — R0989 Other specified symptoms and signs involving the circulatory and respiratory systems: Secondary | ICD-10-CM | POA: Diagnosis not present

## 2013-12-13 DIAGNOSIS — D649 Anemia, unspecified: Secondary | ICD-10-CM | POA: Diagnosis not present

## 2013-12-13 DIAGNOSIS — M79609 Pain in unspecified limb: Secondary | ICD-10-CM

## 2013-12-13 DIAGNOSIS — I739 Peripheral vascular disease, unspecified: Secondary | ICD-10-CM

## 2013-12-13 DIAGNOSIS — M79661 Pain in right lower leg: Secondary | ICD-10-CM

## 2013-12-13 DIAGNOSIS — M25571 Pain in right ankle and joints of right foot: Secondary | ICD-10-CM

## 2013-12-13 DIAGNOSIS — M25579 Pain in unspecified ankle and joints of unspecified foot: Secondary | ICD-10-CM | POA: Diagnosis not present

## 2013-12-13 LAB — URIC ACID: Uric Acid, Serum: 4.1 mg/dL (ref 2.4–7.0)

## 2013-12-13 LAB — CBC WITH DIFFERENTIAL/PLATELET
Basophils Absolute: 0 10*3/uL (ref 0.0–0.1)
Basophils Relative: 0.6 % (ref 0.0–3.0)
Eosinophils Absolute: 0.1 10*3/uL (ref 0.0–0.7)
Eosinophils Relative: 2.4 % (ref 0.0–5.0)
HCT: 32.6 % — ABNORMAL LOW (ref 36.0–46.0)
Hemoglobin: 10.3 g/dL — ABNORMAL LOW (ref 12.0–15.0)
Lymphocytes Relative: 39.8 % (ref 12.0–46.0)
Lymphs Abs: 2.3 10*3/uL (ref 0.7–4.0)
MCHC: 31.5 g/dL (ref 30.0–36.0)
MCV: 78.5 fl (ref 78.0–100.0)
Monocytes Absolute: 0.3 10*3/uL (ref 0.1–1.0)
Monocytes Relative: 5.2 % (ref 3.0–12.0)
Neutro Abs: 3 10*3/uL (ref 1.4–7.7)
Neutrophils Relative %: 52 % (ref 43.0–77.0)
Platelets: 341 10*3/uL (ref 150.0–400.0)
RBC: 4.15 Mil/uL (ref 3.87–5.11)
RDW: 19.4 % — ABNORMAL HIGH (ref 11.5–15.5)
WBC: 5.8 10*3/uL (ref 4.0–10.5)

## 2013-12-13 LAB — IBC PANEL
Iron: 24 ug/dL — ABNORMAL LOW (ref 42–145)
Saturation Ratios: 5.9 % — ABNORMAL LOW (ref 20.0–50.0)
Transferrin: 291.4 mg/dL (ref 212.0–360.0)

## 2013-12-13 LAB — VITAMIN B12: Vitamin B-12: >1500 pg/mL — ABNORMAL HIGH (ref 211–911)

## 2013-12-13 NOTE — Progress Notes (Signed)
   Subjective:    Patient ID: [name], female    DOB: 07-09-1930, 80 y.o.   MRN: [id number]  HPI    She was seen 12/06/13 in the emergency room for pain in the right ankle which had been present for 2 weeks and was accelerating. It was described as sharp and up to a level XV on 10 scale.  She has a past history of gout in the left hand in 2009.  The ER note states that she was to receive colchicine 0.6 mg; the only prescription  she received was Percocet.  No imaging or uric acid level was measured.  The last uric acid on record was 5.0 on 05/20/2008.  She rarely drinks wine. She is not on HCTZ.  Chart review reveals significant anemia with a hemoglobin 8.9 hematocrit 29.5 on 11/04/13. She had microcytic, hypochromic indices. Those labs were from the emergency room when she was seen for midline low back pain.  She's exhibited mild hyperglycemia but her A1c is in the prediabetic range.    Review of Systems    She denies any redness with the right ankle pain but it was swollen.  She denies fever, chills, or sweats  She describes claudication-type symptoms chronically.        Objective:   Physical Exam   Pertinent positive findings include: She appears much younger than stated age. There is some splitting of the first heart sound with slight slurring She has a right carotid rumble. There is asymmetric edema of the right ankle, greatest at the right lateral malleolus Homans sign is equivocal in the right calf The right foot pedal pulses are slightly decreased compared to the left. There no definite ischemic changes.  General appearance :adequately nourished; in no distress. Eyes: No conjunctival inflammation or scleral icterus is present. Oral exam: Dental hygiene is good. Lips and gums are healthy appearing.There is no oropharyngeal erythema or exudate noted.  Heart:  Normal rate and regular rhythm. S2 normal without gallop, murmur, click, rub or other extra sounds    Lungs:Chest clear to auscultation; no wheezes, rhonchi,rales ,or rubs present.No increased work of breathing.  Abdomen: bowel sounds normal, soft and non-tender without masses, organomegaly or hernias noted.  No guarding or rebound. No flank tenderness to percussion. Skin:Warm & dry.  Intact without suspicious lesions or rashes ; no jaundice or tenting Lymphatic: No lymphadenopathy is noted about the head, neck, axilla           Assessment & Plan:  #1 right ankle pain, with PMH of gout. She had improvement with narcotic without definitive treatment for gout.  #2 decreased right pedal pulses without associated ischemic changes.  #3 history suggestive of claudication; equivocal Homans.  #4 anemia 11/04/13  Plan: See orders and recommendations

## 2013-12-13 NOTE — Patient Instructions (Signed)
Your next office appointment will be determined based upon review of your pending labs  x-rays. Those instructions will be transmitted to you through My Chart  OR  by mail;whichever process is your choice to receive results & recommendations .Followup as needed for your acute issue. Please report any significant change in your symptoms. 

## 2013-12-13 NOTE — Progress Notes (Signed)
Pre visit review using our clinic review tool, if applicable. No additional management support is needed unless otherwise documented below in the visit note. 

## 2013-12-14 ENCOUNTER — Ambulatory Visit (HOSPITAL_COMMUNITY): Payer: Medicare Other | Attending: Internal Medicine | Admitting: Cardiology

## 2013-12-14 ENCOUNTER — Other Ambulatory Visit: Payer: Self-pay | Admitting: Internal Medicine

## 2013-12-14 DIAGNOSIS — Z87891 Personal history of nicotine dependence: Secondary | ICD-10-CM | POA: Insufficient documentation

## 2013-12-14 DIAGNOSIS — M7989 Other specified soft tissue disorders: Secondary | ICD-10-CM | POA: Diagnosis not present

## 2013-12-14 DIAGNOSIS — M79609 Pain in unspecified limb: Secondary | ICD-10-CM | POA: Insufficient documentation

## 2013-12-14 DIAGNOSIS — E785 Hyperlipidemia, unspecified: Secondary | ICD-10-CM | POA: Insufficient documentation

## 2013-12-14 DIAGNOSIS — I2510 Atherosclerotic heart disease of native coronary artery without angina pectoris: Secondary | ICD-10-CM | POA: Diagnosis not present

## 2013-12-14 DIAGNOSIS — R7989 Other specified abnormal findings of blood chemistry: Secondary | ICD-10-CM

## 2013-12-14 DIAGNOSIS — E119 Type 2 diabetes mellitus without complications: Secondary | ICD-10-CM | POA: Insufficient documentation

## 2013-12-14 DIAGNOSIS — M79661 Pain in right lower leg: Secondary | ICD-10-CM

## 2013-12-14 LAB — D-DIMER, QUANTITATIVE (NOT AT ARMC): D-Dimer, Quant: 1.38 ug/mL-FEU — ABNORMAL HIGH (ref 0.00–0.48)

## 2013-12-14 NOTE — Progress Notes (Signed)
Right lower venous duplex performed  

## 2013-12-14 NOTE — Progress Notes (Signed)
Pt scheduled for doppler today @ 3:30pm. Pt is aware.

## 2013-12-15 ENCOUNTER — Encounter (HOSPITAL_COMMUNITY): Payer: Self-pay | Admitting: Emergency Medicine

## 2013-12-15 ENCOUNTER — Other Ambulatory Visit: Payer: Self-pay | Admitting: Internal Medicine

## 2013-12-15 ENCOUNTER — Telehealth: Payer: Self-pay | Admitting: Internal Medicine

## 2013-12-15 ENCOUNTER — Emergency Department (HOSPITAL_COMMUNITY): Payer: Medicare Other

## 2013-12-15 ENCOUNTER — Emergency Department (HOSPITAL_COMMUNITY)
Admission: EM | Admit: 2013-12-15 | Discharge: 2013-12-15 | Disposition: A | Discharge status: Home / Self Care | Payer: Medicare Other | Attending: Emergency Medicine | Admitting: Emergency Medicine

## 2013-12-15 DIAGNOSIS — Z7982 Long term (current) use of aspirin: Secondary | ICD-10-CM | POA: Insufficient documentation

## 2013-12-15 DIAGNOSIS — K219 Gastro-esophageal reflux disease without esophagitis: Secondary | ICD-10-CM | POA: Diagnosis not present

## 2013-12-15 DIAGNOSIS — E119 Type 2 diabetes mellitus without complications: Secondary | ICD-10-CM | POA: Insufficient documentation

## 2013-12-15 DIAGNOSIS — M79609 Pain in unspecified limb: Secondary | ICD-10-CM | POA: Insufficient documentation

## 2013-12-15 DIAGNOSIS — M109 Gout, unspecified: Secondary | ICD-10-CM | POA: Diagnosis not present

## 2013-12-15 DIAGNOSIS — E785 Hyperlipidemia, unspecified: Secondary | ICD-10-CM | POA: Insufficient documentation

## 2013-12-15 DIAGNOSIS — M1990 Unspecified osteoarthritis, unspecified site: Secondary | ICD-10-CM | POA: Diagnosis not present

## 2013-12-15 DIAGNOSIS — Z951 Presence of aortocoronary bypass graft: Secondary | ICD-10-CM | POA: Insufficient documentation

## 2013-12-15 DIAGNOSIS — Z79899 Other long term (current) drug therapy: Secondary | ICD-10-CM | POA: Diagnosis not present

## 2013-12-15 DIAGNOSIS — I2510 Atherosclerotic heart disease of native coronary artery without angina pectoris: Secondary | ICD-10-CM | POA: Insufficient documentation

## 2013-12-15 DIAGNOSIS — R0989 Other specified symptoms and signs involving the circulatory and respiratory systems: Secondary | ICD-10-CM

## 2013-12-15 DIAGNOSIS — Z87891 Personal history of nicotine dependence: Secondary | ICD-10-CM | POA: Diagnosis not present

## 2013-12-15 DIAGNOSIS — Z791 Long term (current) use of non-steroidal anti-inflammatories (NSAID): Secondary | ICD-10-CM | POA: Diagnosis not present

## 2013-12-15 DIAGNOSIS — IMO0001 Reserved for inherently not codable concepts without codable children: Secondary | ICD-10-CM | POA: Insufficient documentation

## 2013-12-15 DIAGNOSIS — I10 Essential (primary) hypertension: Secondary | ICD-10-CM | POA: Diagnosis not present

## 2013-12-15 DIAGNOSIS — M19079 Primary osteoarthritis, unspecified ankle and foot: Secondary | ICD-10-CM | POA: Diagnosis not present

## 2013-12-15 DIAGNOSIS — M25571 Pain in right ankle and joints of right foot: Secondary | ICD-10-CM

## 2013-12-15 DIAGNOSIS — Z862 Personal history of diseases of the blood and blood-forming organs and certain disorders involving the immune mechanism: Secondary | ICD-10-CM | POA: Insufficient documentation

## 2013-12-15 MED ORDER — PREDNISONE 10 MG PO TABS: 10.0000 mg | ORAL_TABLET | Freq: Three times a day (TID) | ORAL | Status: DC

## 2013-12-15 NOTE — Telephone Encounter (Signed)
Dr. [name] Heck, [name]'s daughter, request phone call from the assistant concern about [name] health condition. Please give her a call, [name] went into the ER again for her toe pain, no med change or anything at all.

## 2013-12-15 NOTE — ED Provider Notes (Signed)
Medical screening examination/treatment/procedure(s) were conducted as a shared visit with non-physician practitioner(s) and myself.  I personally evaluated the patient during the encounter.  R great toe pain x1 day which patient attributes to gout.  Seen recently for similar pain in ankle. Decreased PT and DP pulse compared to R. No signs go DVT. TTP R great toe.   EKG Interpretation None       Ezequiel Essex, MD 12/15/13 [phone]

## 2013-12-15 NOTE — Discharge Instructions (Signed)
Please call your doctor for a followup appointment within 24-48 hours. When you talk to your doctor please let them know that you were seen in the emergency department and have them acquire all of your records so that they can discuss the findings with you and formulate a treatment plan to fully care for your new and ongoing problems. Please call and set-up an appointment with your primary care provider to be seen and re-assessed Please call and set-up an appointment with foot doctor regarding bunion noted on the right foot Please rest and stay hydrated Please decrease intake of purines that can be found in red meats  Please continue to take at home medications as prescribed Please continue to monitor symptoms closely and if symptoms are to worsen or change (fever greater than 101, chills, sweating, nausea, vomiting, chest pain, shortness of breathe, difficulty breathing, weakness, numbness, tingling, worsening or changes to pain pattern, redness, hot to the touch, swelling, fall, injury, red streaks running up the leg) please report back to the Emergency Department immediately.    Gout Gout is an inflammatory arthritis caused by a buildup of uric acid crystals in the joints. Uric acid is a chemical that is normally present in the blood. When the level of uric acid in the blood is too high it can form crystals that deposit in your joints and tissues. This causes joint redness, soreness, and swelling (inflammation). Repeat attacks are common. Over time, uric acid crystals can form into masses (tophi) near a joint, destroying bone and causing disfigurement. Gout is treatable and often preventable. CAUSES  The disease begins with elevated levels of uric acid in the blood. Uric acid is produced by your body when it breaks down a naturally found substance called purines. Certain foods you eat, such as meats and fish, contain high amounts of purines. Causes of an elevated uric acid level include:  Being  passed down from parent to child (heredity).  Diseases that cause increased uric acid production (such as obesity, psoriasis, and certain cancers).  Excessive alcohol use.  Diet, especially diets rich in meat and seafood.  Medicines, including certain cancer-fighting medicines (chemotherapy), water pills (diuretics), and aspirin.  Chronic kidney disease. The kidneys are no longer able to remove uric acid well.  Problems with metabolism. Conditions strongly associated with gout include:  Obesity.  High blood pressure.  High cholesterol.  Diabetes. Not everyone with elevated uric acid levels gets gout. It is not understood why some people get gout and others do not. Surgery, joint injury, and eating too much of certain foods are some of the factors that can lead to gout attacks. SYMPTOMS   An attack of gout comes on quickly. It causes intense pain with redness, swelling, and warmth in a joint.  Fever can occur.  Often, only one joint is involved. Certain joints are more commonly involved:  Base of the big toe.  Knee.  Ankle.  Wrist.  Finger. Without treatment, an attack usually goes away in a few days to weeks. Between attacks, you usually will not have symptoms, which is different from many other forms of arthritis. DIAGNOSIS  Your caregiver will suspect gout based on your symptoms and exam. In some cases, tests may be recommended. The tests may include:  Blood tests.  Urine tests.  X-rays.  Joint fluid exam. This exam requires a needle to remove fluid from the joint (arthrocentesis). Using a microscope, gout is confirmed when uric acid crystals are seen in the joint fluid. TREATMENT  There are two phases to gout treatment: treating the sudden onset (acute) attack and preventing attacks (prophylaxis).  Treatment of an Acute Attack.  Medicines are used. These include anti-inflammatory medicines or steroid medicines.  An injection of steroid medicine into the  affected joint is sometimes necessary.  The painful joint is rested. Movement can worsen the arthritis.  You may use warm or cold treatments on painful joints, depending which works best for you.  Treatment to Prevent Attacks.  If you suffer from frequent gout attacks, your caregiver may advise preventive medicine. These medicines are started after the acute attack subsides. These medicines either help your kidneys eliminate uric acid from your body or decrease your uric acid production. You may need to stay on these medicines for a very long time.  The early phase of treatment with preventive medicine can be associated with an increase in acute gout attacks. For this reason, during the first few months of treatment, your caregiver may also advise you to take medicines usually used for acute gout treatment. Be sure you understand your caregiver's directions. Your caregiver may make several adjustments to your medicine dose before these medicines are effective.  Discuss dietary treatment with your caregiver or dietitian. Alcohol and drinks high in sugar and fructose and foods such as meat, poultry, and seafood can increase uric acid levels. Your caregiver or dietitian can advise you on drinks and foods that should be limited. HOME CARE INSTRUCTIONS   Do not take aspirin to relieve pain. This raises uric acid levels.  Only take over-the-counter or prescription medicines for pain, discomfort, or fever as directed by your caregiver.  Rest the joint as much as possible. When in bed, keep sheets and blankets off painful areas.  Keep the affected joint raised (elevated).  Apply warm or cold treatments to painful joints. Use of warm or cold treatments depends on which works best for you.  Use crutches if the painful joint is in your leg.  Drink enough fluids to keep your urine clear or pale yellow. This helps your body get rid of uric acid. Limit alcohol, sugary drinks, and fructose  drinks.  Follow your dietary instructions. Pay careful attention to the amount of protein you eat. Your daily diet should emphasize fruits, vegetables, whole grains, and fat-free or low-fat milk products. Discuss the use of coffee, vitamin C, and cherries with your caregiver or dietitian. These may be helpful in lowering uric acid levels.  Maintain a healthy body weight. SEEK MEDICAL CARE IF:   You develop diarrhea, vomiting, or any side effects from medicines.  You do not feel better in 24 hours, or you are getting worse. SEEK IMMEDIATE MEDICAL CARE IF:   Your joint becomes suddenly more tender, and you have chills or a fever. MAKE SURE YOU:   Understand these instructions.  Will watch your condition.  Will get help right away if you are not doing well or get worse. Document Released: 04/03/2000 Document Revised: 08/21/2013 Document Reviewed: 11/18/2011 [facility] Patient Information 2015 [place], [place]. This information is not intended to replace advice given to you by your health care provider. Make sure you discuss any questions you have with your health care provider. Low-Purine Diet Purines are compounds that affect the level of uric acid in your body. A low-purine diet is a diet that is low in purines. Eating a low-purine diet can prevent the level of uric acid in your body from getting too high and causing gout or kidney stones or both.  WHAT DO I NEED TO KNOW ABOUT THIS DIET?  Choose low-purine foods. Examples of low-purine foods are listed in the next section.  Drink plenty of fluids, especially water. Fluids can help remove uric acid from your body. Try to drink 8-16 cups (1.9-3.8 L) a day.  Limit foods high in fat, especially saturated fat, as fat makes it harder for the body to get rid of uric acid. Foods high in saturated fat include pizza, cheese, ice cream, whole milk, fried foods, and gravies. Choose foods that are lower in fat and lean sources of protein. Use olive oil  when cooking as it contains healthy fats that are not high in saturated fat.  Limit alcohol. Alcohol interferes with the elimination of uric acid from your body. If you are having a gout attack, avoid all alcohol.  Keep in mind that different people's bodies react differently to different foods. You will probably learn over time which foods do or do not affect you. If you discover that a food tends to cause your gout to flare up, avoid eating that food. You can more freely enjoy foods that do not cause problems. If you have any questions about a food item, talk to your dietitian or health care provider. WHICH FOODS ARE LOW, MODERATE, AND HIGH IN PURINES? The following is a list of foods that are low, moderate, and high in purines. You can eat any amount of the foods that are low in purines. You may be able to have small amounts of foods that are moderate in purines. Ask your health care provider how much of a food moderate in purines you can have. Avoid foods high in purines. Grains  Foods low in purines: Enriched white bread, pasta, rice, cake, cornbread, popcorn.  Foods moderate in purines: Whole-grain breads and cereals, wheat germ, bran, oatmeal. Uncooked oatmeal. Dry wheat bran or wheat germ.  Foods high in purines: Pancakes, [place] toast, biscuits, muffins. Vegetables  Foods low in purines: All vegetables, except those that are moderate in purines.  Foods moderate in purines: Asparagus, cauliflower, spinach, mushrooms, green peas. Fruits  All fruits are low in purines. Meats and other Protein Foods  Foods low in purines: Eggs, nuts, peanut butter.  Foods moderate in purines: 80-90% lean beef, lamb, veal, pork, poultry, fish, eggs, peanut butter, nuts. Crab, lobster, oysters, and shrimp. Cooked dried beans, peas, and lentils.  Foods high in purines: Anchovies, sardines, herring, mussels, tuna, codfish, scallops, trout, and haddock. Berniece Salines. Organ meats (such as liver or kidney). Tripe.  Game meat. Goose. Sweetbreads. Dairy  All dairy foods are low in purines. Low-fat and fat-free dairy products are best because they are low in saturated fat. Beverages  Drinks low in purines: Water, carbonated beverages, tea, coffee, cocoa.  Drinks moderate in purines: Soft drinks and other drinks sweetened with high-fructose corn syrup. Juices. To find whether a food or drink is sweetened with high-fructose corn syrup, look at the ingredients list.  Drinks high in purines: Alcoholic beverages (such as beer). Condiments  Foods low in purines: Salt, herbs, olives, pickles, relishes, vinegar.  Foods moderate in purines: Butter, margarine, oils, mayonnaise. Fats and Oils  Foods low in purines: All types, except gravies and sauces made with meat.  Foods high in purines: Gravies and sauces made with meat. Other Foods  Foods low in purines: Sugars, sweets, gelatin. Cake. Soups made without meat.  Foods moderate in purines: Meat-based or fish-based soups, broths, or bouillons. Foods and drinks sweetened with high-fructose corn syrup.  Foods high in purines: High-fat desserts (such as ice cream, cookies, cakes, pies, doughnuts, and chocolate). Contact your dietitian for more information on foods that are not listed here. Document Released: 08/01/2010 Document Revised: 04/11/2013 Document Reviewed: 03/13/2013 [facility] Patient Information 2015 [place], [place]. This information is not intended to replace advice given to you by your health care provider. Make sure you discuss any questions you have with your health care provider.

## 2013-12-15 NOTE — ED Notes (Signed)
Patient taken to radiology for xray.

## 2013-12-15 NOTE — Telephone Encounter (Signed)
Pt daughter called in and stated that mother went to hospital again for swelling and pain in her foot.   Dr. Linna Darner spoke with Dr. [name] Heck (daughter) and informed/instructed that pt was evaluated and he addressed the anemia. The uric acid level was only 4 and not indicative of gout.  Dr. Linna Darner has okayed prednisone [facility] Oppen[facility] at [facility]) and will set up with a rheumatoid consult to further evaluate gout.

## 2013-12-15 NOTE — Consult Note (Signed)
I have examined the patient, reviewed and agree with above. No evidence of significant ischemia. Does have palpable dorsalis pedis pulse. I discussed this with the patient and her daughter. Continued workup for other sources of pain  Danette Weinfeld, MD 12/15/2013 3:49 PM

## 2013-12-15 NOTE — ED Notes (Signed)
Patient complains of pain in right ankle and foot due to gout. States she can not put weight on right foot. States she saw Dr. Linna Darner and was tested for embolus in right leg; states test was negative.

## 2013-12-15 NOTE — Consult Note (Signed)
[facility] [place]  Reason for Consult:  Right great toe pain Referring Physician:  ED Provider MRN #:  [id number]  History of Present Illness: This is a 80 y.o. female with past medical history of CAD s/p CABG, osteoarthritis, spinal stenosis and diabetes mellitus who presents to the [facility] ED with complaints of right great toe pain since this morning. She says the pain is aching in nature and extremely painful to touch. She also describes "tingling" of her right toes. She denies any trauma. She has had similar pain in her left great toe which she attributes to gout. She has never been formally treated for gout. She takes percocet as needed for pain. For the past two weeks, she has described pain that originates in her lower back and travels down the anterior aspect of her right leg into her feet. She describes pain in her leg with walking immediately with initiation of activity.   On ROS, she denies any weakness of her arms and legs, rest pain, and non healing wounds of her feet.  She has hypertension with three antihypertensives. She has hyperlipidemia managed with a statin. Her diabetes is managed with oral hypoglycemics. She is on a daily aspirin.   Past Medical History  Diagnosis Date  . S/P CABG x 1   . S/P inguinal hernia repair   . 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 CAD, NATIVE VESSEL 06/09/2008  . UNSPECIFIED ANEMIA   . DIABETES MELLITUS, TYPE II   . GERD   . HYPERLIPIDEMIA   . HYPERTENSION   . HIATAL HERNIA   . FIBROMYALGIA   . OA (osteoarthritis)     severe, shoulders, hands - inflammatory, ?RA  . Gout   . Insomnia    Past Surgical History  Procedure Laterality Date  . Hernia repair    . Abdominal hysterectomy  1981  . Coronary artery bypass graft  2010  . Appendectomy    . Replacement total knee bilateral    . Rotator cuff repair      Left  . Ovarian cyst  removal    . Cataract extraction, bilateral    . Joint replacement    . Reverse shoulder arthroplasty  09/08/2011    Procedure: REVERSE SHOULDER ARTHROPLASTY;  Surgeon: Nita Sells, MD;  Location: [facility];  Service: Orthopedics;  Laterality: Left;  left reverse total shoulder arthroplasty    No Known Allergies  Prior to Admission medications   Medication Sig Start Date End Date Taking? Authorizing Provider  ALPRAZolam Duanne Moron) 0.25 MG tablet Take 0.25 mg by mouth daily as needed for anxiety.   Yes Historical Provider, MD  amLODipine (NORVASC) 5 MG tablet Take 5 mg by mouth daily.   Yes Historical Provider, MD  aspirin EC 81 MG tablet Take 81 mg by mouth daily.   Yes Historical Provider, MD  atorvastatin (LIPITOR) 40 MG tablet Take 40 mg by mouth at bedtime.   Yes Historical Provider, MD  carvedilol (COREG) 6.25 MG tablet Take 6.25 mg by mouth 2 (two) times daily with a meal.   Yes Historical Provider, MD  cetirizine (ZYRTEC) 10 MG tablet Take 10 mg by mouth at bedtime.   Yes Historical Provider, MD  Cyanocobalamin (VITAMIN B-12 PO) Take 1 tablet by mouth daily with breakfast.    Yes Historical Provider, MD  meloxicam (MOBIC) 7.5 MG tablet Take 7.5 mg by mouth daily with  breakfast.    Yes Historical Provider, MD  metFORMIN (GLUCOPHAGE-XR) 500 MG 24 hr tablet Take 500 mg by mouth daily with breakfast.   Yes Historical Provider, MD  Multiple Vitamin (MULITIVITAMIN WITH MINERALS) TABS Take 1 tablet by mouth daily with breakfast.    Yes Historical Provider, MD  omeprazole (PRILOSEC) 40 MG capsule Take 1 capsule (40 mg total) by mouth daily. 05/10/13  Yes Rowe Clack, MD  oxycodone-acetaminophen (PERCOCET) 2.5-325 MG per tablet Take 1 tablet by mouth every 4 (four) hours as needed for pain. 11/29/13  Yes Drucilla Schmidt, MD  polyethylene glycol (MIRALAX / GLYCOLAX) packet Take 17 g by mouth at bedtime.   Yes Historical Provider, MD  ramipril (ALTACE) 5 MG capsule Take 1 capsule (5 mg  total) by mouth daily. 10/17/13  Yes Rowe Clack, MD  senna (SENOKOT) 8.6 MG TABS Take 2-3 tablets by mouth at bedtime.    Yes Historical Provider, MD  VITAMIN E PO Take 1 capsule by mouth daily with breakfast.    Yes Historical Provider, MD    History   Social History  . Marital Status: Widowed    Spouse Name: N/A    Number of Children: N/A  . Years of Education: N/A   Occupational History  . Not on file.   Social History Main Topics  . Smoking status: Former Smoker    Quit date: 04/26/1970  . 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 Alcohol Use: 0.6 oz/week    1 Glasses of wine per week  . Drug Use: No  . Sexual Activity: Not on file   Other Topics Concern  . Not on file   Social History Narrative  . No narrative on file   Family History  Problem Relation Age of Onset  . Cancer Mother     Stomach  . Stomach cancer Mother 16  . Heart disease Father 66    MI  . Diabetes Sister   . Heart disease Sister   . Heart attack Brother   . Anesthesia problems Neg Hx   . Colon cancer Neg Hx     ROS: [x]  Positive   [ ]  Negative   [ ]  All sytems reviewed and are negative  Cardiovascular: []  chest pain/pressure []  palpitations []  SOB lying flat []  DOE [x pain in legs while walking []  pain in legs at rest []  pain in legs at night []  non-healing ulcers []  hx of DVT []  swelling in legs  Pulmonary: []  productive cough []  asthma/wheezing []  home O2  Neurologic: []  weakness in []  arms []  legs [x]  numbness in []  arms []  legs [x]  feet []  hx of CVA []  mini stroke [] difficulty speaking or slurred speech []  temporary loss of vision in one eye []  dizziness  Hematologic: []  hx of cancer []  bleeding problems []  problems with blood clotting easily  Endocrine:   []  diabetes []  thyroid disease  GI []  vomiting blood []  blood in stool  GU: []  CKD/renal failure []  HD--[]  M/W/F or []  T/T/S []   burning with urination []  blood in urine  Psychiatric: []  anxiety []  depression  Musculoskeletal: [x]  arthritis [x]  joint pain  Integumentary: []  rashes []  ulcers  Constitutional: []  fever []  chills   Physical Examination  Filed Vitals:   12/15/13 1208  BP: 171/70  Pulse: 68  Temp:   Resp: 18   Body mass index is 27.05 kg/(m^2).  General:  WDWN in NAD Gait: Not observed HENT: WNL, normocephalic Eyes: Pupils equal Pulmonary: normal non-labored breathing, without rales, rhonchi,  wheezing Cardiac: regular, without  Murmurs, rubs or gallops; without carotid bruits Abdomen: soft, NT/ND, no masses Skin: without rashes, without ulcers  Vascular Exam/Pulses:  Right Left  Radial 2+ (normal) 2+ (normal)  Femoral 1+ (weak) 2+ (normal)  Popliteal 2+ (normal) 2+ (normal)  DP 1+ (weak) 2+ (normal)  PT 1+ (weak) 2+ (normal)   Extremities: without ischemic changes, without Gangrene , without cellulitis; without open wounds;  Musculoskeletal: no muscle wasting or atrophy, 4/5 strength RLE due to pain. Otherwise 5/5 strength upper and lower extremities bilaterally Neurologic: A&O X 3; Appropriate Affect ; SENSATION: normal; MOTOR FUNCTION: Speech is fluent/normal Psychiatric:  Judgment intact, Mood & affect appropriate for pt's clinical situation   CBC    Component Value Date/Time   WBC 5.8 12/13/2013 1407   RBC 4.15 12/13/2013 1407   HGB 10.3* 12/13/2013 1407   HCT 32.6* 12/13/2013 1407   PLT 341.0 12/13/2013 1407   MCV 78.5 12/13/2013 1407   MCH 23.2* 11/04/2013 2302   MCHC 31.5 12/13/2013 1407   RDW 19.4* 12/13/2013 1407   LYMPHSABS 2.3 12/13/2013 1407   MONOABS 0.3 12/13/2013 1407   EOSABS 0.1 12/13/2013 1407   BASOSABS 0.0 12/13/2013 1407    BMET    Component Value Date/Time   NA 136* 11/04/2013 2302   K 3.5* 11/04/2013 2302   CL 95* 11/04/2013 2302   CO2 24 11/04/2013 2302   GLUCOSE 107* 11/04/2013 2302   BUN 9 11/04/2013 2302   CREATININE 0.69 11/04/2013 2302    CALCIUM 8.7 11/04/2013 2302   GFRNONAA 79* 11/04/2013 2302   GFRAA >90 11/04/2013 2302    COAGS: Lab Results  Component Value Date   INR 0.99 11/06/2010   INR 1.4 05/22/2008   INR 1.2 05/21/2008    ASSESSMENT: This is a 80 y.o. female with new onset right great toe pain and right leg pain.   PLAN: The patient has palpable femoral, popliteal and pedal pulses bilaterally and doppler signals. She has no ischemic changes of her foot. She has normal motor and sensory function. Do not suspect acute thromboembolus or arterial insufficiency as cause of her toe pain. The pain in her right leg is likely of neurologic etiology due to her spinal stenosis. The immediate nature of her pain with walking is not related to demand ischemia and arterial insufficiency. We will not pursue any further workup at this point. She has had previous gout in her left great toe and believes the pain she is experiencing now is comparable to gout. Please call with questions. Follow-up as needed. Thank you for your consult.    Virgina Jock, PA-C [facility] Office: [phone] Pager: [phone]

## 2013-12-15 NOTE — ED Notes (Signed)
Vascular Surgery at bedside

## 2013-12-15 NOTE — ED Provider Notes (Signed)
CSN: [id number]     Arrival date & time 12/15/13  0756 History   First MD Initiated Contact with Patient 12/15/13 0802     Chief Complaint  Patient presents with  . Foot Pain     (Consider location/radiation/quality/duration/timing/severity/associated sxs/prior Treatment) Patient is a 80 y.o. female presenting with lower extremity pain. The history is provided by the patient. No language interpreter was used.  Foot Pain Associated symptoms include arthralgias (right great toe pain ). Pertinent negatives include no chest pain, chills, fever, headaches, joint swelling, numbness or weakness.  DYLIN IHNEN is an 80 y/o F with PMHx of CABG in 2010, diabetes, GERD, hyperlipidemia, hypertension, gout, fibromyalgia presenting to the ED with right great toe pain that started this morning. Patient reports that the pain is localized to the right great toe described as an aching discomfort with "pain" going through it. Patient reports she's on a daily dose of Mobic 7.5 mg. Stated that she's been taking a half of a Percocet as needed for pain. Stated that her first outbreak of calculus in 2009. Denied fever, numbness, tingling, injuries, fall, changes to colors, chest pain, short of breath, difficulty breathing, loss of sensation. PCP Dr. Asa Lente   Past Medical History  Diagnosis Date  . S/P CABG x 1   . S/P inguinal hernia repair   . 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 CAD, NATIVE VESSEL 06/09/2008  . UNSPECIFIED ANEMIA   . DIABETES MELLITUS, TYPE II   . GERD   . HYPERLIPIDEMIA   . HYPERTENSION   . HIATAL HERNIA   . FIBROMYALGIA   . OA (osteoarthritis)     severe, shoulders, hands - inflammatory, ?RA  . Gout   . Insomnia    Past Surgical History  Procedure Laterality Date  . Hernia repair    . Abdominal hysterectomy  1981  . Coronary artery bypass graft  2010  . Appendectomy    . Replacement total knee  bilateral    . Rotator cuff repair      Left  . Ovarian cyst removal    . Cataract extraction, bilateral    . Joint replacement    . Reverse shoulder arthroplasty  09/08/2011    Procedure: REVERSE SHOULDER ARTHROPLASTY;  Surgeon: Nita Sells, MD;  Location: [facility];  Service: Orthopedics;  Laterality: Left;  left reverse total shoulder arthroplasty   Family History  Problem Relation Age of Onset  . Cancer Mother     Stomach  . Stomach cancer Mother 62  . Heart disease Father 63    MI  . Diabetes Sister   . Heart disease Sister   . Heart attack Brother   . Anesthesia problems Neg Hx   . Colon cancer Neg Hx    History  Substance Use Topics  . Smoking status: Former Smoker    Quit date: 04/26/1970  . 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 Alcohol Use: 0.6 oz/week    1 Glasses of wine per week   OB History   Grav Para Term Preterm Abortions TAB SAB Ect Mult Living                 Review of Systems  Constitutional: Negative for fever and chills.  Respiratory: Negative for chest tightness and shortness of breath.   Cardiovascular: Negative for chest  pain.  Musculoskeletal: Positive for arthralgias (right great toe pain ). Negative for joint swelling.  Neurological: Negative for weakness, numbness and headaches.      Allergies  Review of patient's allergies indicates no known allergies.  Home Medications   Prior to Admission medications   Medication Sig Start Date End Date Taking? Authorizing Provider  ALPRAZolam Duanne Moron) 0.25 MG tablet Take 0.25 mg by mouth daily as needed for anxiety.   Yes Historical Provider, MD  amLODipine (NORVASC) 5 MG tablet Take 5 mg by mouth daily.   Yes Historical Provider, MD  aspirin EC 81 MG tablet Take 81 mg by mouth daily.   Yes Historical Provider, MD  atorvastatin (LIPITOR) 40 MG tablet Take 40 mg by mouth at bedtime.   Yes Historical Provider, MD    carvedilol (COREG) 6.25 MG tablet Take 6.25 mg by mouth 2 (two) times daily with a meal.   Yes Historical Provider, MD  cetirizine (ZYRTEC) 10 MG tablet Take 10 mg by mouth at bedtime.   Yes Historical Provider, MD  Cyanocobalamin (VITAMIN B-12 PO) Take 1 tablet by mouth daily with breakfast.    Yes Historical Provider, MD  meloxicam (MOBIC) 7.5 MG tablet Take 7.5 mg by mouth daily with breakfast.    Yes Historical Provider, MD  metFORMIN (GLUCOPHAGE-XR) 500 MG 24 hr tablet Take 500 mg by mouth daily with breakfast.   Yes Historical Provider, MD  Multiple Vitamin (MULITIVITAMIN WITH MINERALS) TABS Take 1 tablet by mouth daily with breakfast.    Yes Historical Provider, MD  omeprazole (PRILOSEC) 40 MG capsule Take 1 capsule (40 mg total) by mouth daily. 05/10/13  Yes Rowe Clack, MD  oxycodone-acetaminophen (PERCOCET) 2.5-325 MG per tablet Take 1 tablet by mouth every 4 (four) hours as needed for pain. 11/29/13  Yes Drucilla Schmidt, MD  polyethylene glycol (MIRALAX / GLYCOLAX) packet Take 17 g by mouth at bedtime.   Yes Historical Provider, MD  ramipril (ALTACE) 5 MG capsule Take 1 capsule (5 mg total) by mouth daily. 10/17/13  Yes Rowe Clack, MD  senna (SENOKOT) 8.6 MG TABS Take 2-3 tablets by mouth at bedtime.    Yes Historical Provider, MD  VITAMIN E PO Take 1 capsule by mouth daily with breakfast.    Yes Historical Provider, MD   BP 184/54  Pulse 83  Temp(Src) 98.8 F (37.1 C) (Oral)  Resp 16  Ht 4\' 11"  (1.499 m)  Wt 134 lb (60.782 kg)  BMI 27.05 kg/m2  SpO2 97% Physical Exam  Nursing note and vitals reviewed. Constitutional: She is oriented to person, place, and time. She appears well-developed and well-nourished. No distress.  HENT:  Head: Normocephalic and atraumatic.  Mouth/Throat: Oropharynx is clear and moist. No oropharyngeal exudate.  Eyes: Conjunctivae and EOM are normal. Pupils are equal, round, and reactive to light. Right eye exhibits no discharge. Left eye  exhibits no discharge.  Neck: Normal range of motion. Neck supple. No tracheal deviation present.  Cardiovascular: Normal rate, regular rhythm and normal heart sounds.  Exam reveals no friction rub.   No murmur heard. Pulses:      Radial pulses are 2+ on the right side, and 2+ on the left side.       Dorsalis pedis pulses are 1+ on the right side, and 2+ on the left side.  Cap refill < 3 seconds Negative swelling or pitting edema noted to the lower extremities bilaterally   Decreased pedal pulses and PT pulses to the right lower  extremity-negative signs of ischemia noted.  Pulmonary/Chest: Effort normal and breath sounds normal. No respiratory distress. She has no wheezes. She has no rales.  Musculoskeletal: Normal range of motion. She exhibits tenderness. She exhibits no edema.       Right foot: She exhibits tenderness. She exhibits normal range of motion, no swelling, normal capillary refill, no crepitus and no deformity.       Feet:  Negative erythema, inflammation, lesions, sores, deformities identified to the right great toe. Negative warmth upon palpation. Tenderness upon palpation to the right great toe-localized to the base. Full flexion extension identified to the digits of the right foot. Full range of motion to the right ankle without difficulty or ataxia.  Lymphadenopathy:    She has no cervical adenopathy.  Neurological: She is alert and oriented to person, place, and time. No cranial nerve deficit. She exhibits normal muscle tone. Coordination normal.  Cranial nerves III-XII grossly intact Strength 5+/5+ to upper and lower extremities bilaterally with resistance applied, equal distribution noted Strength intact to the digits of the feet bilaterally  Negative facial drooping Negative slurred speech  Negative aphasia  Negative arm drift Fine motor skills intact Heel to knee down shin normal bilaterally Gait proper, proper balance - negative sway, negative drift, negative  step-offs  Skin: Skin is warm and dry. No rash noted. She is not diaphoretic. No erythema.  Psychiatric: She has a normal mood and affect. Her behavior is normal. Thought content normal.    ED Course  Procedures (including critical care time)  9:23 AM Patient seen and assessed by attending physician, Dr. Chauncey Cruel. Rancour. As per attending physician recommended arterial doppler to the RLE secondary to decreased pedal pulses.   12:36 PM Patient taken to [place] for Arterial Doppler to be performed.   1:30 PM This provider spoke with Dr. Donnetta Hutching from Vascular Surgery - discussed case, labs and imaging in great detail as well as physical exam Patient to be seen and assessed by physician.   2:02 PM This provider spoke with Dr. Donnetta Hutching, as per vascular surgeon reported that patient can be discharged home and follow-up as an outpatient. Reported that she does have pulses present and no signs of ischemia present on exam. Physician is not concerned.   Labs Review Labs Reviewed - No data to display  Imaging Review Dg Ankle Complete Right  12/15/2013   CLINICAL DATA:  Chronic pain and swelling of the right ankle without known injury  EXAM: RIGHT ANKLE - COMPLETE 3+ VIEW  COMPARISON:  Right foot series of today's date.  FINDINGS: The bones are osteopenic. The ankle joint mortise is preserved but the joint space is narrowed. There is a small spur noted from the anterior malleolar region of the tibia. There is no acute fracture or dislocation. There is diffuse soft tissue swelling. There are plantar and Achilles region calcaneal spurs. There is arterial calcification.  IMPRESSION: There are degenerative changes of the right ankle. There is no acute fracture nor dislocation.   Electronically Signed   By: David  Martinique   On: 12/15/2013 09:09   Dg Foot Complete Right  12/15/2013   CLINICAL DATA:  Chronic great toe pain.  EXAM: RIGHT FOOT COMPLETE - 3+ VIEW  COMPARISON:  None.  FINDINGS: No fracture.  No dislocation.  There  is a hallux valgus deformity of the great toe. First metatarsophalangeal joint is normally spaced and aligned. There are minimal marginal osteophytes at this joint. No periarticular osteopenia or erosion. There is mild  bony prominence from the medial first metatarsal head consistent with a small bunion. There is overlying mild soft tissue swelling. Several small soft tissue calcifications lie adjacent to the first metatarsophalangeal joint, nonspecific.  Remaining joints are normally spaced and aligned. There are plantar and dorsal calcaneal spurs. Bones are demineralized.  IMPRESSION: 1. No fracture or acute finding. 2. Minor first metatarsophalangeal joint osteoarthritis, small medial first metatarsal head bunion and hallux valgus deformity. Mild soft tissue swelling adjacent to the first metatarsophalangeal joint. No erosions.   Electronically Signed   By: Lajean Manes M.D.   On: 12/15/2013 09:11     EKG Interpretation None      MDM   Final diagnoses:  Gout of right foot, unspecified cause, unspecified chronicity  Osteoarthritis, unspecified osteoarthritis type, unspecified site    Medications - No data to display  Filed Vitals:   12/15/13 1200 12/15/13 1208 12/15/13 1415 12/15/13 1430  BP: 171/70 171/70 180/73 184/54  Pulse: 70 68 77 83  Temp:      TempSrc:      Resp:  18  16  Height:      Weight:      SpO2: 100% 100% 100% 97%   This provider reviewed patient's chart. Patient was last seen and assessed on 12/06/2013 in the emergency department regarding a gout attack in her left ankle - patient is given culture seen 0.6 mg and discharged with Percocets. Patient seen and assessed by her primary care provider on 12/13/2013 where uric acid levels were 4.1, d-dimer elevated 1.38-lower extremity Doppler performed with unremarkable findings. Plain film of right foot negative for acute fracture. Minor first metatarsophalangeal joint osteoporosis, small medial first metatarsal head bunion  and helix valgus deformity-mild soft tissue swelling adjacent to the first metatarsophalangeal joint -no erosions. Plain film of right ankle or degenerative changes of the right ankle with negative acute fracture or dislocation. Elevated d-dimer performed yesterday - 1.38. Patient denied chest pain, shortness of breath, difficulty breathing. Negative hypoxia, tachypnea, and tachycardia while in the ED setting. Patient reported that doppler of the lower extremity was negative yesterday - reported that she was told there was no clots in her lower extremities. Discussed case with attending physician, Dr. Chauncey Cruel. Rancour who reported that since patient is not symptomatic and patient is stable with vitals CT angiogram of chest does not need to be performed.  Minimal decrease in pedal pulses to the right lower extremity - arterial doppler ordered.  Arterial Doppler identified approximately 50-99% stenosis in the distal right femoral artery - although artery has pulses monophasic. AT is noncompressible. Negative signs of ischemia noted to the right lower extremities. Patient seen and assessed by vascular surgery who does not believe this to be acute thromboembolus or arterial insufficiency-believed to be gout. Doubt acute thromboembolus. Doubt arterial insufficiency. Doubt septic joint. Negative signs of ischemia. Suspicion to be gout. Negative focal neurological deficits noted. Strength intact. Sensation intact. Patient seen and cleared for discharge by Vascular Surgery. Negative signs of respiratory distress. Denied chest pain, shortness of breath, difficulty breathing. Patient stable, afebrile. Patient not septic appearing. Discharged patient. Referred patient to PCP - discussed with patient that she may need to be placed on maintenance therapy. Discussed with patient to rest and elevate. Discussed with patient to continue to use at home medications as prescribed. Discussed with patient to closely monitor symptoms and if  symptoms are to worsen or change to report back to the ED - strict return instructions given.  Patient agreed  to plan of care, understood, all questions answered.   Jamse Mead, PA-C 12/15/13 [phone]

## 2013-12-15 NOTE — ED Notes (Signed)
Patient returned from xray.

## 2013-12-15 NOTE — Progress Notes (Signed)
VASCULAR LAB PRELIMINARY  ARTERIAL = Right = Evidence of 50-99% stenosis of the distal femoral artery.   ABI completed: Non compressible ABIs most likely due to calcified vessels    RIGHT    LEFT    PRESSURE WAVEFORM  PRESSURE WAVEFORM  BRACHIAL 192 Tri BRACHIAL 178 Tri  DP   DP    AT Non comp Strong mono AT Non comp Tri  PT  Strong mono PT  Bi  PER   PER    GREAT TOE  NA GREAT TOE  NA    RIGHT LEFT  ABI      Landry Mellow, RDMS, RVT  12/15/2013, 12:59 PM

## 2013-12-16 ENCOUNTER — Other Ambulatory Visit: Payer: Self-pay | Admitting: Internal Medicine

## 2013-12-19 ENCOUNTER — Encounter: Payer: Self-pay | Admitting: Internal Medicine

## 2013-12-19 ENCOUNTER — Ambulatory Visit (INDEPENDENT_AMBULATORY_CARE_PROVIDER_SITE_OTHER): Payer: Medicare Other | Admitting: Internal Medicine

## 2013-12-19 VITALS — BP 160/76 | HR 68 | Temp 98.7°F | Wt 144.0 lb

## 2013-12-19 DIAGNOSIS — M79609 Pain in unspecified limb: Secondary | ICD-10-CM | POA: Diagnosis not present

## 2013-12-19 DIAGNOSIS — M79671 Pain in right foot: Secondary | ICD-10-CM

## 2013-12-19 DIAGNOSIS — I739 Peripheral vascular disease, unspecified: Secondary | ICD-10-CM | POA: Insufficient documentation

## 2013-12-19 NOTE — Patient Instructions (Signed)
Share results with all non [facility] medical staff seen  

## 2013-12-19 NOTE — Progress Notes (Signed)
   Subjective:    Patient ID: [name], female    DOB: April 16, 1931, 80 y.o.   MRN: [id number]  HPI   12/15/13 ER records were reviewed. Venous Doppler revealed no deep venous thrombosis despite the elevated d-dimer. The lower extremity arterial duplex revealed a 50-99% stenosis of the right distal femoral artery. She was unaware of these results and has not been referred to the vascular specialist.  Because of the intractable pain in her ankle & foot; prednisone 10 mg 3 times a day was prescribed 8/28. She's had a dramatic improvement in the pain and swelling of the right lower extremity from the ankle down.  Her uric acid on 8/26 was only 4.1; but she states her present pain is much like the gout attacks she's had in the past.  She is scheduled to see a "foot specialist" tomorrow. Rheumatology consultation had been requested because of the atypical picture.        Review of Systems    She is not describing claudication type symptoms or neurogenic claudication since she's been on the prednisone. She has been followed by a back specialist and has had multiple injections, apparently epidural steroid injections for neurogenic claudication.  Despite the steroids; her glucoses have not been above 150.  Chest pain, palpitations, tachycardia, exertional dyspnea,or paroxysmal nocturnal dyspnea  are absent.        Objective:   Physical Exam   Positive or pertinent findings include: She appears much younger than her stated age. She is bright and alert and using her portable lap top. The first heart sound is accentuated. Pedal pulses are decreased but equal. The posterior tibial pulses are slightly more decreased than the dorsalis pedis pulses She has valgus changes of the knees. There is crepitus of knees with range of motion. There is tenderness to palpation over the joint at the base of the right great toe. She's also tender with range of motion testing of the right ankle.  Lipomatous changes are noted below the right lateral malleolus.  General appearance :adequately nourished; in no distress. Eyes: No conjunctival inflammation or scleral icterus is present. Heart:  Normal rate and regular rhythm. S2 normal without gallop, murmur, click, rub or other extra sounds   Lungs:Chest clear to auscultation; no wheezes, rhonchi,rales ,or rubs present.No increased work of breathing.  Abdomen: bowel sounds normal, soft and non-tender without masses, organomegaly or hernias noted.  No guarding or rebound.  Skin:Warm & dry.  Intact without suspicious lesions or rashes ; no jaundice or tenting Lymphatic: No lymphadenopathy is noted about the head, neck, axilla            Assessment & Plan:  #1 classic gout-type symptoms with dramatic response to oral steroids. Extremely low uric acid of 4.1. Plane images do not show classic gouty changes.  #2 elevated d-dimer with normal venous Doppler  #3  50-99% stenosis of the right distal femoral artery.  Plan: She will see the foot specialist 9/2. She was given copies of her x-rays and the arterial study. Dr. Sherren Mocha Early will be contacted to see if he should see her to evaluate the femoral artery stenosis.

## 2013-12-19 NOTE — Progress Notes (Signed)
Pre visit review using our clinic review tool, if applicable. No additional management support is needed unless otherwise documented below in the visit note. 

## 2013-12-20 ENCOUNTER — Encounter: Payer: Self-pay | Admitting: Podiatry

## 2013-12-20 ENCOUNTER — Ambulatory Visit (INDEPENDENT_AMBULATORY_CARE_PROVIDER_SITE_OTHER): Payer: Medicare Other | Admitting: Podiatry

## 2013-12-20 VITALS — BP 150/70 | HR 66 | Ht 59.0 in | Wt 144.0 lb

## 2013-12-20 DIAGNOSIS — B351 Tinea unguium: Secondary | ICD-10-CM | POA: Diagnosis not present

## 2013-12-20 DIAGNOSIS — M109 Gout, unspecified: Secondary | ICD-10-CM

## 2013-12-20 DIAGNOSIS — M79604 Pain in right leg: Secondary | ICD-10-CM

## 2013-12-20 DIAGNOSIS — M10071 Idiopathic gout, right ankle and foot: Secondary | ICD-10-CM

## 2013-12-20 DIAGNOSIS — M79609 Pain in unspecified limb: Secondary | ICD-10-CM

## 2013-12-20 NOTE — Patient Instructions (Signed)
Seen for recent gouty attack and problematic nails. All nails debrided. No edema or erythema noted. Gout has subsided.  Return in 3 months or as needed.

## 2013-12-20 NOTE — Progress Notes (Signed)
Subjective: Had bad foot pain in right ankle and big toe a week ago and is much less pain now.  Patient wants to have diabetic shoes.  Having difficulty trimming nails.   Review of Systems - General ROS: Had fever and chills middle of last week and now is back to normal.  Ophthalmic ROS: Left eye burns some times. Will be checked out.  ENT ROS: negative Allergy and Immunology ROS: Zyrtec takes care of allergy problem. Breast ROS: Get checked out yearly and now is time to have another check. Respiratory ROS: no cough, shortness of breath, or wheezing Cardiovascular ROS: no chest pain or dyspnea on exertion Gastrointestinal ROS: no abdominal pain, change in bowel habits, or black or bloody stools Genito-Urinary ROS: no dysuria, trouble voiding, or hematuria Musculoskeletal ROS: Have knee and shoulder arthritics. Hurts on right shoulder at times. Neurological ROS: Been treated with cortisone injection on back pain. Dermatological ROS: negative.  Objective: Dermatologic: Within normal. Hypertrophic nails x 10. Vascular: All pedal pulses are palpable. Right DP is weaker than the left. Neuro: Normal response to Monofilament testing on both feet. Orthopedic: Severe bunion bilateral. Contracted lesser digits bilateral.  Assessment: Recent gouty attack right lower limb. Bilateral bunion deformity. Onychomycosis x 10.  Plan: Reviewed findings and available options. All nails debrided. Return in 3 months for RFC. Will try diabetic shoes on next visit.

## 2014-01-01 ENCOUNTER — Telehealth: Payer: Self-pay | Admitting: Vascular Surgery

## 2014-01-01 DIAGNOSIS — H10509 Unspecified blepharoconjunctivitis, unspecified eye: Secondary | ICD-10-CM | POA: Diagnosis not present

## 2014-01-01 NOTE — Telephone Encounter (Addendum)
Message copied by Doristine Section on Mon Jan 01, 2014 11:22 AM ------      Message from: Peter Minium K      Created: Mon Jan 01, 2014 10:01 AM      Regarding: Schedule                   ----- Message -----         From: Rosetta Posner, MD         Sent: 01/01/2014   9:45 AM           To: [facility] Charge Pool            I need to see this patient referred from Dr. Linna Darner regarding right leg ischemia. Needs office visit in the next several weeks. No vascular lab studies needed  notified patient of appointment on 01-23-14 at 10:15am, mailed new patient letter   ------

## 2014-01-04 DIAGNOSIS — I10 Essential (primary) hypertension: Secondary | ICD-10-CM | POA: Insufficient documentation

## 2014-01-04 DIAGNOSIS — M5430 Sciatica, unspecified side: Secondary | ICD-10-CM | POA: Diagnosis not present

## 2014-01-04 DIAGNOSIS — M545 Low back pain, unspecified: Secondary | ICD-10-CM | POA: Diagnosis not present

## 2014-01-04 DIAGNOSIS — M48062 Spinal stenosis, lumbar region with neurogenic claudication: Secondary | ICD-10-CM | POA: Diagnosis not present

## 2014-01-04 DIAGNOSIS — M4716 Other spondylosis with myelopathy, lumbar region: Secondary | ICD-10-CM | POA: Diagnosis not present

## 2014-01-04 NOTE — Progress Notes (Signed)
HPI Patient is a 80 year old with a history of CAD (s/p CABG in 2010), HTN, HL and fibromyalgia. I saw her in 2014.  SHe has had a lot of problems with pain in legs  Gout  Had doppler to LE showing signif PAD Denies CP  Breathing is OK  No palpitations  No dizziness.  No Known Allergies   Current Outpatient Prescriptions  Medication Sig Dispense Refill  . amLODipine (NORVASC) 5 MG tablet Take 5 mg by mouth daily.      Marland Kitchen aspirin EC 81 MG tablet Take 81 mg by mouth daily.      Marland Kitchen atorvastatin (LIPITOR) 40 MG tablet Take 40 mg by mouth at bedtime.      . carvedilol (COREG) 6.25 MG tablet Take 6.25 mg by mouth 2 (two) times daily with a meal.      . cetirizine (ZYRTEC) 10 MG tablet Take 10 mg by mouth at bedtime.      . Cyanocobalamin (VITAMIN B-12 PO) Take 1 tablet by mouth daily with breakfast.       . meloxicam (MOBIC) 7.5 MG tablet Take 7.5 mg by mouth daily with breakfast.       . metFORMIN (GLUCOPHAGE-XR) 500 MG 24 hr tablet TAKE 1 TABLET DAILY WITH   BREAKFAST  90 tablet  1  . Multiple Vitamin (MULITIVITAMIN WITH MINERALS) TABS Take 1 tablet by mouth daily with breakfast.       . omeprazole (PRILOSEC) 40 MG capsule Take 1 capsule (40 mg total) by mouth daily.  90 capsule  3  . oxycodone-acetaminophen (PERCOCET) 2.5-325 MG per tablet Take 1 tablet by mouth every 4 (four) hours as needed for pain.  30 tablet  0  . polyethylene glycol (MIRALAX / GLYCOLAX) packet Take 17 g by mouth at bedtime.      . predniSONE (DELTASONE) 10 MG tablet Take 1 tablet (10 mg total) by mouth 3 (three) times daily before meals.  15 tablet  0  . ramipril (ALTACE) 5 MG capsule Take 1 capsule (5 mg total) by mouth daily.  90 capsule  3  . senna (SENOKOT) 8.6 MG TABS Take 2-3 tablets by mouth at bedtime.       Marland Kitchen VITAMIN E PO Take 1 capsule by mouth daily with breakfast.        No current facility-administered medications for this visit.    Past Medical History  Diagnosis Date  . S/P CABG x 1   . S/P inguinal  hernia repair   . TUBULOVILLOUS ADENOMA, 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    colo 04/2012 - no polyps - no further screening planned (age >37)  . CAD, NATIVE VESSEL 06/09/2008  . UNSPECIFIED ANEMIA   . DIABETES MELLITUS, TYPE II   . GERD   . HYPERLIPIDEMIA   . HYPERTENSION   . HIATAL HERNIA   . FIBROMYALGIA   . OA (osteoarthritis)     severe, shoulders, hands - inflammatory, ?RA  . Gout   . Insomnia     Past Surgical History  Procedure Laterality Date  . Hernia repair    . Abdominal hysterectomy  1981  . Coronary artery bypass graft  2010  . Appendectomy    . Replacement total knee bilateral    . Rotator cuff repair      Left  . Ovarian cyst removal    . Cataract extraction, bilateral    . Joint replacement    . Reverse shoulder arthroplasty  09/08/2011    Procedure: REVERSE SHOULDER  ARTHROPLASTY;  Surgeon: Nita Sells, MD;  Location: [facility];  Service: Orthopedics;  Laterality: Left;  left reverse total shoulder arthroplasty    Family History  Problem Relation Age of Onset  . Cancer Mother     Stomach  . Stomach cancer Mother 87  . Heart disease Father 50    MI  . Diabetes Sister   . Heart disease Sister   . Heart attack Brother   . Anesthesia problems Neg Hx   . Colon cancer Neg Hx     History   Social History  . Marital Status: Widowed    Spouse Name: N/A    Number of Children: N/A  . Years of Education: N/A   Occupational History  . Not on file.   Social History Main Topics  . Smoking status: Former Smoker    Quit date: 04/26/1970  . 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 Alcohol Use: 0.6 oz/week    1 Glasses of wine per week  . Drug Use: No  . Sexual Activity: Not on file   Other Topics Concern  . Not on file   Social History Narrative  . No narrative on file    Review of Systems:  All systems reviewed.  They are negative to the above problem except as previously  stated.  Vital Signs:  138/68   P 69   Physical Exam  HEENT:  Normocephalic, atraumatic. EOMI, PERRLA.  Neck: JVP is normal.  No bruits.  Lungs: clear to auscultation. No rales no wheezes.  Heart: Regular rate and rhythm. Normal S1, S2. No S3.   No significant murmurs. PMI not displaced.  Abdomen:  Supple, nontender. Normal bowel sounds. No masses. No hepatomegaly.  Extremities:   Good distal pulses throughout. No lower extremity edema.  Musculoskeletal :moving all extremities.  Neuro:   alert and oriented x3.  CN II-XII grossly intact.  EKG  SR  59 bpm     Assessment and Plan:  1.  CAD.  Doing well  No symptoms of angina.  2.  HTN  Adequate control    3.  Hyperlipidemia.  Continue statin.  Check lipids    4.  PVOD  Has appt with [facility]  5.  Gout  Finished steroids.

## 2014-01-05 ENCOUNTER — Encounter: Payer: Self-pay | Admitting: Internal Medicine

## 2014-01-05 ENCOUNTER — Ambulatory Visit (INDEPENDENT_AMBULATORY_CARE_PROVIDER_SITE_OTHER): Payer: Medicare Other | Admitting: Internal Medicine

## 2014-01-05 VITALS — BP 138/68 | HR 59 | Ht 59.0 in | Wt 144.0 lb

## 2014-01-05 DIAGNOSIS — E785 Hyperlipidemia, unspecified: Secondary | ICD-10-CM

## 2014-01-05 DIAGNOSIS — R002 Palpitations: Secondary | ICD-10-CM

## 2014-01-05 DIAGNOSIS — D649 Anemia, unspecified: Secondary | ICD-10-CM

## 2014-01-05 LAB — BASIC METABOLIC PANEL
BUN: 10 mg/dL (ref 6–23)
CO2: 28 mEq/L (ref 19–32)
Calcium: 9.1 mg/dL (ref 8.4–10.5)
Chloride: 100 mEq/L (ref 96–112)
Creatinine, Ser: 0.9 mg/dL (ref 0.4–1.2)
GFR: 74.31 mL/min (ref >60.00)
Glucose, Bld: 113 mg/dL — ABNORMAL HIGH (ref 70–99)
Potassium: 4.4 mEq/L (ref 3.5–5.1)
Sodium: 134 mEq/L — ABNORMAL LOW (ref 135–145)

## 2014-01-05 LAB — CBC
HCT: 29.3 % — ABNORMAL LOW (ref 36.0–46.0)
Hemoglobin: 9.1 g/dL — ABNORMAL LOW (ref 12.0–15.0)
MCHC: 31.1 g/dL (ref 30.0–36.0)
MCV: 79.4 fl (ref 78.0–100.0)
Platelets: 210 10*3/uL (ref 150.0–400.0)
RBC: 3.69 Mil/uL — ABNORMAL LOW (ref 3.87–5.11)
RDW: 19.6 % — ABNORMAL HIGH (ref 11.5–15.5)
WBC: 4.4 10*3/uL (ref 4.0–10.5)

## 2014-01-05 LAB — LIPID PANEL
Cholesterol: 196 mg/dL (ref 0–200)
HDL: 84.1 mg/dL (ref >39.00)
LDL Cholesterol: 96 mg/dL (ref 0–99)
NonHDL: 111.9
Total CHOL/HDL Ratio: 2
Triglycerides: 82 mg/dL (ref 0.0–149.0)
VLDL: 16.4 mg/dL (ref 0.0–40.0)

## 2014-01-05 LAB — AST: AST: 26 U/L (ref 0–37)

## 2014-01-05 LAB — SEDIMENTATION RATE: Sed Rate: 26 mm/hr — ABNORMAL HIGH (ref 0–22)

## 2014-01-05 NOTE — Patient Instructions (Addendum)
Your physician recommends that you continue on your current medications as directed. Please refer to the Current Medication list given to you today.  Labs today.   Your physician wants you to follow-up in: [place].  You will receive a reminder letter in the mail two months in advance. If you don't receive a letter, please call our office to schedule the follow-up appointment.

## 2014-01-12 ENCOUNTER — Encounter: Payer: Self-pay | Admitting: Internal Medicine

## 2014-01-12 ENCOUNTER — Ambulatory Visit (INDEPENDENT_AMBULATORY_CARE_PROVIDER_SITE_OTHER): Payer: Medicare Other | Admitting: Internal Medicine

## 2014-01-12 VITALS — BP 188/84 | HR 60 | Temp 98.3°F | Wt 147.4 lb

## 2014-01-12 DIAGNOSIS — I10 Essential (primary) hypertension: Secondary | ICD-10-CM

## 2014-01-12 DIAGNOSIS — M79609 Pain in unspecified limb: Secondary | ICD-10-CM

## 2014-01-12 DIAGNOSIS — M5430 Sciatica, unspecified side: Secondary | ICD-10-CM

## 2014-01-12 DIAGNOSIS — M79671 Pain in right foot: Secondary | ICD-10-CM

## 2014-01-12 DIAGNOSIS — M48061 Spinal stenosis, lumbar region without neurogenic claudication: Secondary | ICD-10-CM

## 2014-01-12 DIAGNOSIS — M5431 Sciatica, right side: Secondary | ICD-10-CM

## 2014-01-12 DIAGNOSIS — Z23 Encounter for immunization: Secondary | ICD-10-CM | POA: Diagnosis not present

## 2014-01-12 MED ORDER — GABAPENTIN 100 MG PO CAPS: 100.0000 mg | ORAL_CAPSULE | Freq: Every day | ORAL | Status: DC

## 2014-01-12 MED ORDER — GABAPENTIN 100 MG PO CAPS: ORAL_CAPSULE | ORAL | Status: DC

## 2014-01-12 NOTE — Progress Notes (Signed)
   Subjective:    Patient ID: [name], female    DOB: 1930/11/08, 80 y.o.   MRN: [id number]  HPI   She continues to have the severe pain in the right lumbosacral area with sciatic radiation to the heel. She has seen Dr.Saullo who has by her history given her 24 spinal injections. He plans additional injections if her pain recurs.  It was severe enough for her to call the EMS last week. By the time they arrived it had  improved and she did not go to ER.  It is described as variable in intensity and from sharp to dull to burning.  Additionally she describes pain across the dorsal  right foot for over a year. She has an appointment with Dr. Trudie Reed to assess this. The patient believes that this is related to gout. Her uric acid was 4.1 on 8./26/15.  She is only checking her blood pressure once a week. She believes it averages 140/70. She is on carvedilol 6.25 mg one twice a day as well as low-dose amlodipine and ramipril.  Review of systems is positive for purposeful weight loss.  With exacerbation of her back pain she will have palpitations.      Review of Systems   Chest pain,  tachycardia, exertional dyspnea, paroxysmal nocturnal dyspnea, claudication or edema are absent.  She denies fever, chills, sweats, or unintentional weight loss she  She also denies incontinence of urine or stool.  There is no change in color or temperature of skin in the area of the pain.        Objective:   Physical Exam   Positive pertinent findings include: She has obvious loss of height. She limps on the right lower extremity. She has a grade 7/3-7 systolic murmur. She is tender to percussion over the right lumbosacral area The right dorsalis pedis pulse is decreased. There are no ischemic skin changes. She has scattered minor DIP Osteoarthritic changes of the hands. There is lateral deviation of the toes and some isolated hammertoe deformities. She has crepitus of the knees. There  is visible swelling over the dorsum of the right foot. There is a palpable prominence of the right lateral malleolar area. Straight leg raising is negative  General appearance :adequately nourished; in no distress.Appears younger than stated age Eyes: No conjunctival inflammation or scleral icterus is present. Heart:  Normal rate and regular rhythm. S1 and S2 normal without gallop,click, rub or other extra sounds   Lungs:Chest clear to auscultation; no wheezes, rhonchi,rales ,or rubs present.No increased work of breathing.  Abdomen: bowel sounds normal, soft and non-tender without masses, organomegaly or hernias noted.  No guarding or rebound. No flank tenderness to percussion. Skin:Warm & dry.  Intact without suspicious lesions or rashes ; no jaundice or tenting Lymphatic: No lymphadenopathy is noted about the head, neck, axilla             Assessment & Plan:  #1spinal stenosis with right lumbosacral radiculopathy and sciatica A trial of Neurontin will be initiated qhs as she has major symptoms in the early morning. She should see Dr.Saullo for further treatment. If options are limited; she could be referred to the chronic pain clinic.  #2 foot  pain; consultation with Dr. Trudie Reed 01/23/14  #3 hypertension, uncontrolled. Blood pressure goals and monitor her were discussed. Carvedilol will be increased to one and one half pills twice a day.

## 2014-01-12 NOTE — Progress Notes (Signed)
Pre visit review using our clinic review tool, if applicable. No additional management support is needed unless otherwise documented below in the visit note. 

## 2014-01-12 NOTE — Patient Instructions (Signed)
Minimal Blood Pressure Goal= AVERAGE < 140/90;  Ideal is an AVERAGE < 135/85. This AVERAGE should be calculated from @ least 5-7 BP readings taken @ different times of day on different days of week. You should not respond to isolated BP readings , but rather the AVERAGE for that week .Please bring your  blood pressure cuff to office visits to verify that it is reliable.It  can also be checked against the blood pressure device at the pharmacy. Finger or wrist cuffs are not dependable; an arm cuff is. Assess response to the gabapentin one @ bedtime. If it is partially beneficial, it can be increased up to a total of 3 pills @ bedtime as needed. This increase of 1 pill each dose  should take place over 72 hours at least.

## 2014-01-22 ENCOUNTER — Encounter: Payer: Self-pay | Admitting: Vascular Surgery

## 2014-01-23 ENCOUNTER — Ambulatory Visit: Payer: Medicare Other | Admitting: Internal Medicine

## 2014-01-23 ENCOUNTER — Encounter: Payer: Self-pay | Admitting: Vascular Surgery

## 2014-01-23 ENCOUNTER — Ambulatory Visit (INDEPENDENT_AMBULATORY_CARE_PROVIDER_SITE_OTHER): Payer: Medicare Other | Admitting: Vascular Surgery

## 2014-01-23 VITALS — BP 140/51 | HR 55 | Ht 59.0 in | Wt 146.3 lb

## 2014-01-23 DIAGNOSIS — I739 Peripheral vascular disease, unspecified: Secondary | ICD-10-CM | POA: Diagnosis not present

## 2014-01-23 DIAGNOSIS — I379 Nonrheumatic pulmonary valve disorder, unspecified: Secondary | ICD-10-CM | POA: Insufficient documentation

## 2014-01-23 NOTE — Progress Notes (Signed)
Patient name: [name] MRN: [id number] DOB: May 17, 1930 Sex: female   Referred by: Angus  Reason for referral:  Chief Complaint  Patient presents with  . New Evaluation    Rt leg ischemia    HISTORY OF PRESENT ILLNESS: Very pleasant 80 year old female seen today for evaluation of right leg arterial insufficiency. She has multiple components of lower extremity discomfort. The majority of this appears to be related to degenerative disc disease. She does report a history of back disc disease. She also reports arthritic changes in her right lateral ankle and right fifth toe. She has been diagnosed with gout. She does not have any true claudication type symptoms. No arterial rest pain. No lower extremity tissue loss.  Past Medical History  Diagnosis Date  . S/P CABG x 1   . S/P inguinal hernia repair   . TUBULOVILLOUS ADENOMA, 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    colo 04/2012 - no polyps - no further screening planned (age >51)  . CAD, NATIVE VESSEL 06/09/2008  . UNSPECIFIED ANEMIA   . DIABETES MELLITUS, TYPE II   . GERD   . HYPERLIPIDEMIA   . HYPERTENSION   . HIATAL HERNIA   . FIBROMYALGIA   . OA (osteoarthritis)     severe, shoulders, hands - inflammatory, ?RA  . Gout   . Insomnia     Past Surgical History  Procedure Laterality Date  . Hernia repair    . Abdominal hysterectomy  1981  . Coronary artery bypass graft  2010  . Appendectomy    . Replacement total knee bilateral Bilateral     Rt-1974, Lt -1989  . Rotator cuff repair      Left  . Ovarian cyst removal    . Cataract extraction, bilateral    . Reverse shoulder arthroplasty  09/08/2011    Procedure: REVERSE SHOULDER ARTHROPLASTY;  Surgeon: Nita Sells, MD;  Location: [facility];  Service: Orthopedics;  Laterality: Left;  left reverse total shoulder arthroplasty  . Joint replacement      History   Social History  . Marital Status: Widowed    Spouse Name: N/A    Number of Children: N/A  . Years of  Education: N/A   Occupational History  . Not on file.   Social History Main Topics  . Smoking status: Former Smoker    Quit date: 04/26/1970  . 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 Alcohol Use: 0.6 oz/week    1 Glasses of wine per week  . Drug Use: No  . Sexual Activity: Not on file   Other Topics Concern  . Not on file   Social History Narrative  . No narrative on file    Family History  Problem Relation Age of Onset  . Cancer Mother     Stomach  . Stomach cancer Mother 85  . Hypertension Mother   . Heart disease Father 49    MI  . Diabetes Father   . Heart attack Father   . Diabetes Sister   . Heart disease Sister   . Heart attack Brother   . Cancer Brother   . Diabetes Brother   . Anesthesia problems Neg Hx   . Colon cancer Neg Hx   . Diabetes Daughter   . Hypertension Daughter     Allergies as of 01/23/2014  . (No Known Allergies)    Current Outpatient Prescriptions on File Prior to  Visit  Medication Sig Dispense Refill  . amLODipine (NORVASC) 5 MG tablet Take 5 mg by mouth daily.      Marland Kitchen aspirin EC 81 MG tablet Take 81 mg by mouth daily.      Marland Kitchen atorvastatin (LIPITOR) 40 MG tablet Take 40 mg by mouth at bedtime.      . carvedilol (COREG) 6.25 MG tablet 1 & 1/2 bid      . cetirizine (ZYRTEC) 10 MG tablet Take 10 mg by mouth at bedtime.      . Cyanocobalamin (VITAMIN B-12 PO) Take 1 tablet by mouth daily with breakfast.       . gabapentin (NEURONTIN) 100 MG capsule Take 1 capsule (100 mg total) by mouth at bedtime.  30 capsule  1  . meloxicam (MOBIC) 7.5 MG tablet Take 7.5 mg by mouth daily with breakfast.       . metFORMIN (GLUCOPHAGE-XR) 500 MG 24 hr tablet TAKE 1 TABLET DAILY WITH   BREAKFAST  90 tablet  1  . Multiple Vitamin (MULITIVITAMIN WITH MINERALS) TABS Take 1 tablet by mouth daily with breakfast.       . omeprazole (PRILOSEC) 40 MG capsule Take 1 capsule (40 mg total) by  mouth daily.  90 capsule  3  . oxycodone-acetaminophen (PERCOCET) 2.5-325 MG per tablet Take 1 tablet by mouth every 4 (four) hours as needed for pain.  30 tablet  0  . polyethylene glycol (MIRALAX / GLYCOLAX) packet Take 17 g by mouth at bedtime. for constipation      . ramipril (ALTACE) 5 MG capsule Take 1 capsule (5 mg total) by mouth daily.  90 capsule  3  . senna (SENOKOT) 8.6 MG TABS Take 2-3 tablets by mouth as needed. For constipation      . VITAMIN E PO Take 1 capsule by mouth daily with breakfast.        No current facility-administered medications on file prior to visit.     REVIEW OF SYSTEMS:  Positives indicated with an "X"  CARDIOVASCULAR:  [ ]  chest pain   [ ]  chest pressure   [ ]  palpitations   [ ]  orthopnea   [ ]  dyspnea on exertion   [ ]  claudication   [ ]  rest pain   [ ]  DVT   [ ]  phlebitis PULMONARY:   [ ]  productive cough   [ ]  asthma   [ ]  wheezing NEUROLOGIC:   x ] weakness  [ x] paresthesias  [ ]  aphasia  [ ]  amaurosis  [ ]  dizziness HEMATOLOGIC:   [ ]  bleeding problems   [ ]  clotting disorders MUSCULOSKELETAL:  [ ]  joint pain   [ ]  joint swelling GASTROINTESTINAL: [ ]   blood in stool  [ ]   hematemesis GENITOURINARY:  [ ]   dysuria  [ ]   hematuria PSYCHIATRIC:  [ ]  history of major depression INTEGUMENTARY:  [ ]  rashes  [ ]  ulcers CONSTITUTIONAL:  [ ]  fever   [ ]  chills  PHYSICAL EXAMINATION:  General: The patient is a well-nourished female, in no acute distress. Vital signs are BP 140/51  Pulse 55  Ht 4\' 11"  (1.499 m)  Wt 146 lb 4.8 oz (66.361 kg)  BMI 29.53 kg/m2  SpO2 93% Pulmonary: There is a good air exchange bilaterally without wheezing or rales. Abdomen: Soft and non-tender with normal pitch bowel sounds. Musculoskeletal: There are no major deformities.  There is no significant extremity pain. Neurologic: No focal weakness or paresthesias are detected, Skin: There  are no ulcer or rashes noted. Psychiatric: The patient has normal  affect. Cardiovascular: There is a regular rate and rhythm without significant murmur appreciated. Pulse status: 2+ radial 2+ femoral pulses bilaterally she does have 2+ left dorsalis pedis and 1+ right dorsalis pedis pulse  Vascular Lab Studies: Studies from [facility] hospital from 12/15/2013 were reviewed. This does show calcification making ankle arm index and possible. She does have dampened waveforms with right versus the left   Impression and Plan:  Discussed with the patient. I feel that her very mild arterial insufficiency is causing her discomfort.-She is at no risk for tissue loss was associated with this. Have recommended continued walking program. Appears of her meds earlier for problems apparently related to degenerative disc disease and also to arthritis. She was reassured this discussion will see [place] again on as-needed basis    Colena Ketterman [facility] Office: [phone]

## 2014-01-25 ENCOUNTER — Encounter: Payer: Self-pay | Admitting: Internal Medicine

## 2014-01-25 ENCOUNTER — Ambulatory Visit (INDEPENDENT_AMBULATORY_CARE_PROVIDER_SITE_OTHER): Payer: Medicare Other | Admitting: Internal Medicine

## 2014-01-25 VITALS — BP 128/78 | HR 62 | Temp 97.8°F | Ht 59.0 in | Wt 144.8 lb

## 2014-01-25 DIAGNOSIS — I10 Essential (primary) hypertension: Secondary | ICD-10-CM | POA: Diagnosis not present

## 2014-01-25 DIAGNOSIS — M4806 Spinal stenosis, lumbar region: Secondary | ICD-10-CM | POA: Diagnosis not present

## 2014-01-25 DIAGNOSIS — M48061 Spinal stenosis, lumbar region without neurogenic claudication: Secondary | ICD-10-CM

## 2014-01-25 DIAGNOSIS — M797 Fibromyalgia: Secondary | ICD-10-CM | POA: Diagnosis not present

## 2014-01-25 MED ORDER — GABAPENTIN 100 MG PO CAPS
100.0000 mg | ORAL_CAPSULE | Freq: Four times a day (QID) | ORAL | Status: DC
Start: 2014-01-25 — End: 2014-04-24

## 2014-01-25 NOTE — Progress Notes (Signed)
Subjective:    Patient ID: [name], female    DOB: October 16, 1930, 80 y.o.   MRN: [id number]  HPI  Patient is here for follow up  Reviewed chronic medical issues and interval medical events  Past Medical History  Diagnosis Date  . S/P CABG x 1   . S/P inguinal hernia repair   . TUBULOVILLOUS ADENOMA, 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    colo 04/2012 - no polyps - no further screening planned (age >49)  . CAD, NATIVE VESSEL 06/09/2008  . UNSPECIFIED ANEMIA   . DIABETES MELLITUS, TYPE II   . GERD   . HYPERLIPIDEMIA   . HYPERTENSION   . HIATAL HERNIA   . FIBROMYALGIA   . OA (osteoarthritis)     severe, shoulders, hands - inflammatory, ?RA  . Gout   . Insomnia     Review of Systems  Constitutional: Positive for fatigue. Negative for fever and unexpected weight change.  Respiratory: Negative for cough and shortness of breath.   Cardiovascular: Negative for chest pain and leg swelling.  Musculoskeletal: Positive for arthralgias, back pain, joint swelling and myalgias. Negative for gait problem.  Skin: Negative for rash and wound.       Objective:   Physical Exam  BP 128/78  Pulse 62  Temp(Src) 97.8 F (36.6 C) (Oral)  Ht 4\' 11"  (1.499 m)  Wt 144 lb 12 oz (65.658 kg)  BMI 29.22 kg/m2  SpO2 98% Wt Readings from Last 3 Encounters:  01/25/14 144 lb 12 oz (65.658 kg)  01/23/14 146 lb 4.8 oz (66.361 kg)  01/12/14 147 lb 6 oz (66.849 kg)   Constitutional: She appears well-developed and well-nourished. No distress.  Neck: Normal range of motion. Neck supple. No JVD present. No thyromegaly present.  Cardiovascular: Normal rate, regular rhythm and normal heart sounds.  No murmur heard. No BLE edema. Pulmonary/Chest: Effort normal and breath sounds normal. No respiratory distress. She has no wheezes.  Psychiatric: She has a normal mood and affect. Her behavior is normal. Judgment and thought content normal.   Lab Results  Component Value Date   WBC 4.4 01/05/2014   HGB 9.1* 01/05/2014    HCT 29.3* 01/05/2014   PLT 210.0 01/05/2014   GLUCOSE 113* 01/05/2014   CHOL 196 01/05/2014   TRIG 82.0 01/05/2014   HDL 84.10 01/05/2014   LDLDIRECT 115.3 09/30/2009   LDLCALC 96 01/05/2014   ALT 18 06/09/2013   AST 26 01/05/2014   NA 134* 01/05/2014   K 4.4 01/05/2014   CL 100 01/05/2014   CREATININE 0.9 01/05/2014   BUN 10 01/05/2014   CO2 28 01/05/2014   TSH 0.77 06/09/2013   INR 0.99 11/06/2010   HGBA1C 6.1 09/28/2013   MICROALBUR 3.4* 12/22/2012    Dg Ankle Complete Right  12/15/2013   CLINICAL DATA:  Chronic pain and swelling of the right ankle without known injury  EXAM: RIGHT ANKLE - COMPLETE 3+ VIEW  COMPARISON:  Right foot series of today's date.  FINDINGS: The bones are osteopenic. The ankle joint mortise is preserved but the joint space is narrowed. There is a small spur noted from the anterior malleolar region of the tibia. There is no acute fracture or dislocation. There is diffuse soft tissue swelling. There are plantar and Achilles region calcaneal spurs. There is arterial calcification.  IMPRESSION: There are degenerative changes of the right ankle. There is no acute fracture nor dislocation.   Electronically Signed   By: David  Martinique   On: 12/15/2013 09:09  Dg Foot Complete Right  12/15/2013   CLINICAL DATA:  Chronic great toe pain.  EXAM: RIGHT FOOT COMPLETE - 3+ VIEW  COMPARISON:  None.  FINDINGS: No fracture.  No dislocation.  There is a hallux valgus deformity of the great toe. First metatarsophalangeal joint is normally spaced and aligned. There are minimal marginal osteophytes at this joint. No periarticular osteopenia or erosion. There is mild bony prominence from the medial first metatarsal head consistent with a small bunion. There is overlying mild soft tissue swelling. Several small soft tissue calcifications lie adjacent to the first metatarsophalangeal joint, nonspecific.  Remaining joints are normally spaced and aligned. There are plantar and dorsal calcaneal spurs. Bones  are demineralized.  IMPRESSION: 1. No fracture or acute finding. 2. Minor first metatarsophalangeal joint osteoarthritis, small medial first metatarsal head bunion and hallux valgus deformity. Mild soft tissue swelling adjacent to the first metatarsophalangeal joint. No erosions.   Electronically Signed   By: Lajean Manes M.D.   On: 12/15/2013 09:11       Assessment & Plan:   Problem List Items Addressed This Visit   Essential hypertension      Fair control - continue ongoing medical therapy as ongoing  BP Readings from Last 3 Encounters:  01/25/14 128/78  01/23/14 140/51  01/12/14 188/84      Fibromyalgia syndrome     Diffuse arthritic pains complicated by myalgias - hx inflammatory swelling when NOT on NSAIDs but no prior clarification of polyarthropathy cause Takes daily low-dose meloxicam for same Supportive care and physical therapy as ongoing Working with rheum, pending with Trudie Reed - interval hx reviewed    Spinal stenosis of lumbar region - Primary     Significant spinal stenosis at L4-5 and L5-S1 on MRI December 2014 Has exhausted conservative therapy with sports medicine and unimproved with physical therapy Advised follow up with spine specialist (saullo) for further evaluation and treatment of this problem, ? more ESI, RFA or decompression surgery? Discontinued Lyrica due to side effects continue titration of gabapentin with narcotic as needed    Relevant Orders      Ambulatory referral to Orthopedic Surgery

## 2014-01-25 NOTE — Progress Notes (Signed)
Pre visit review using our clinic review tool, if applicable. No additional management support is needed unless otherwise documented below in the visit note. 

## 2014-01-25 NOTE — Assessment & Plan Note (Signed)
Diffuse arthritic pains complicated by myalgias - hx inflammatory swelling when NOT on NSAIDs but no prior clarification of polyarthropathy cause Takes daily low-dose meloxicam for same Supportive care and physical therapy as ongoing Working with rheum, pending with [facility] - interval hx reviewed

## 2014-01-25 NOTE — Assessment & Plan Note (Signed)
Significant spinal stenosis at L4-5 and L5-S1 on MRI December 2014 Has exhausted conservative therapy with sports medicine and unimproved with physical therapy Advised follow up with spine specialist (saullo) for further evaluation and treatment of this problem, ? more ESI, RFA or decompression surgery? Discontinued Lyrica due to side effects continue titration of gabapentin with narcotic as needed

## 2014-01-25 NOTE — Assessment & Plan Note (Signed)
Fair control - continue ongoing medical therapy as ongoing  BP Readings from Last 3 Encounters:  01/25/14 128/78  01/23/14 140/51  01/12/14 188/84

## 2014-01-25 NOTE — Patient Instructions (Addendum)
It was good to see you today.  We have reviewed your prior records including labs and tests today  Medications reviewed and updated Increase gabapentin to 4 pill/day for pain - Your prescription(s) have been submitted to your pharmacy. Please take as directed and contact our office if you believe you are having problem(s) with the medication(s).  Will arrange follow up with Dr Maia Petties and surgeon if needed to help your pain  follow up with other specialists for arthritis as planned  Please schedule followup in 4 weeks, call sooner if problems.  Spinal Stenosis Spinal stenosis is when the open spaces between the bones of your spine (vertebrae) get smaller (narrow). It is caused by bone pushing on the open spaces of your spine. This puts pressure on your spine and the nerves in your spine. You may be given medicine to lessen the puffiness (inflammation) in your nerves. Other times, surgery is needed. HOME CARE  Change positions when you sit, stand, and lie. This can help take pressure off your nerves.  Do exercises as told by your doctor to strengthen the middle part of your body.  Lose weight if your doctor suggests it. This takes pressure off your spine.  Take all medicine as told by your doctor. MAKE SURE YOU:  Understand these instructions.  Will watch your condition.  Will get help right away if you are not doing well or get worse. Document Released: 07/31/2010 Document Revised: 12/07/2012 Document Reviewed: 07/15/2012 [facility] Patient Information 2015 [place], [place]. This information is not intended to replace advice given to you by your health care provider. Make sure you discuss any questions you have with your health care provider.

## 2014-01-26 ENCOUNTER — Telehealth: Payer: Self-pay | Admitting: Internal Medicine

## 2014-01-26 NOTE — Telephone Encounter (Signed)
emmi emailed °

## 2014-01-30 ENCOUNTER — Other Ambulatory Visit: Payer: Self-pay | Admitting: Internal Medicine

## 2014-01-31 ENCOUNTER — Ambulatory Visit (INDEPENDENT_AMBULATORY_CARE_PROVIDER_SITE_OTHER): Payer: Medicare Other | Admitting: Internal Medicine

## 2014-01-31 ENCOUNTER — Encounter: Payer: Self-pay | Admitting: Internal Medicine

## 2014-01-31 VITALS — BP 150/72 | HR 54 | Temp 98.2°F | Resp 12 | Wt 143.2 lb

## 2014-01-31 DIAGNOSIS — M5416 Radiculopathy, lumbar region: Secondary | ICD-10-CM

## 2014-01-31 NOTE — Progress Notes (Signed)
   Subjective:    Patient ID: [name], female    DOB: 04/30/1930, 80 y.o.   MRN: [id number]  HPI  Patient is seen today for further evaluation of back pain.  She has had chronic back pain for many years and is followed by Dr Maia Petties.    MRI 04/19/2013 demonstrated exaggerated lordosis, L4-5 and L5-S1 spinal stenosis that could cause neural compression.    She has lower lumbar pain with radiculopathy down both legs into her feet.  Right is worse than left.   She states her pain is worse in the mornings.  She was recently placed on Neurontin which does help relieve pain (she is currently taking 300mg  at bedtime and 100mg  around 2am).  She is currently taking 2 percocet per day and mobic daily.    Ice is a significant symptom reliever as well.   She is reluctant to go back to see Dr Maia Petties as suggested because she does not wish to have repeat surgery.    Review of Systems  She denies fever, chills, numbness, bowel or bladder incontinence.  She has not had falls.      Objective:   Physical Exam        Assessment & Plan:

## 2014-01-31 NOTE — Patient Instructions (Signed)
   Please discuss the options we reviewed at your office visit with your daughter, Dr. [name] Heck.  At [facility] they are using low-dose amitriptyline, 10-30 mg at bedtime to treat chronic back pain. They are seeing significant benefits in some patients, but not all. This is an old agent previously used to treat depression but at dramatically higher doses than this level.  Another option would be generic Cymbalta 30-60 mg daily.  I would appreciate her expertise as she has far more experience with these agents than I.

## 2014-01-31 NOTE — Progress Notes (Signed)
   Subjective:    Patient ID: [name], female    DOB: [birth year], 80 y.o.   MRN: [id number]  HPI  She wished to discuss other options than ESI by the back specialist for her intractable pain  Her MRI report was copied and the disease states at various level discussed in detail including diagraming this.  She has had significant response to the gabapentin 100 mg 3 at bedtime. She also takes an extra dose early in the morning " to get through the night".    Review of Systems  Her pain is worse in the morning.  She does have some bladder urgency without incontinence  She has no associated numbness.  She's had no constitutional symptoms of fever , chills, or weight loss.     Objective:   Physical Exam  Exam not completed; office visit was to discuss the risk:benefit ratio of various options open to her.      Assessment & Plan:  #1 bilateral lumbar radiculopathy See AVS

## 2014-01-31 NOTE — Progress Notes (Signed)
Pre visit review using our clinic review tool, if applicable. No additional management support is needed unless otherwise documented below in the visit note. 

## 2014-02-01 ENCOUNTER — Other Ambulatory Visit: Payer: Self-pay | Admitting: Internal Medicine

## 2014-02-01 ENCOUNTER — Encounter: Payer: Self-pay | Admitting: Internal Medicine

## 2014-02-01 DIAGNOSIS — M5416 Radiculopathy, lumbar region: Secondary | ICD-10-CM

## 2014-02-01 MED ORDER — DULOXETINE HCL 30 MG PO CPEP: 30.0000 mg | ORAL_CAPSULE | Freq: Every day | ORAL | Status: DC

## 2014-02-08 DIAGNOSIS — M545 Low back pain: Secondary | ICD-10-CM | POA: Diagnosis not present

## 2014-02-11 ENCOUNTER — Other Ambulatory Visit: Payer: Self-pay | Admitting: Internal Medicine

## 2014-02-14 ENCOUNTER — Telehealth: Payer: Self-pay | Admitting: Internal Medicine

## 2014-02-14 DIAGNOSIS — M5417 Radiculopathy, lumbosacral region: Secondary | ICD-10-CM

## 2014-02-14 NOTE — Telephone Encounter (Signed)
Pt wants to have physical therapy at her home. Pt is having difficulty driving to PT appt and wants MD to have PT come to her home. Please advise. [phone].

## 2014-02-14 NOTE — Telephone Encounter (Signed)
Please see if that is option

## 2014-02-15 NOTE — Telephone Encounter (Signed)
This is an option and will place the referral order for Home PT for you.

## 2014-02-19 DIAGNOSIS — Z0279 Encounter for issue of other medical certificate: Secondary | ICD-10-CM

## 2014-02-20 ENCOUNTER — Other Ambulatory Visit: Payer: Self-pay | Admitting: Internal Medicine

## 2014-02-27 ENCOUNTER — Encounter: Payer: Self-pay | Admitting: Internal Medicine

## 2014-02-27 ENCOUNTER — Other Ambulatory Visit: Payer: Self-pay

## 2014-02-27 ENCOUNTER — Other Ambulatory Visit (INDEPENDENT_AMBULATORY_CARE_PROVIDER_SITE_OTHER): Payer: Medicare Other

## 2014-02-27 ENCOUNTER — Ambulatory Visit (INDEPENDENT_AMBULATORY_CARE_PROVIDER_SITE_OTHER): Payer: Medicare Other | Admitting: Internal Medicine

## 2014-02-27 VITALS — BP 130/74 | HR 56 | Temp 98.0°F | Ht 59.0 in | Wt 137.2 lb

## 2014-02-27 DIAGNOSIS — Z1231 Encounter for screening mammogram for malignant neoplasm of breast: Secondary | ICD-10-CM

## 2014-02-27 DIAGNOSIS — D509 Iron deficiency anemia, unspecified: Secondary | ICD-10-CM

## 2014-02-27 DIAGNOSIS — E119 Type 2 diabetes mellitus without complications: Secondary | ICD-10-CM | POA: Diagnosis not present

## 2014-02-27 DIAGNOSIS — M797 Fibromyalgia: Secondary | ICD-10-CM | POA: Diagnosis not present

## 2014-02-27 DIAGNOSIS — M48061 Spinal stenosis, lumbar region without neurogenic claudication: Secondary | ICD-10-CM

## 2014-02-27 DIAGNOSIS — M4806 Spinal stenosis, lumbar region: Secondary | ICD-10-CM | POA: Diagnosis not present

## 2014-02-27 LAB — CBC WITH DIFFERENTIAL/PLATELET
Basophils Absolute: 0 10*3/uL (ref 0.0–0.1)
Basophils Relative: 0.6 % (ref 0.0–3.0)
Eosinophils Absolute: 0.1 10*3/uL (ref 0.0–0.7)
Eosinophils Relative: 2.1 % (ref 0.0–5.0)
HCT: 36 % (ref 36.0–46.0)
Hemoglobin: 11.4 g/dL — ABNORMAL LOW (ref 12.0–15.0)
Lymphocytes Relative: 40.6 % (ref 12.0–46.0)
Lymphs Abs: 1.8 10*3/uL (ref 0.7–4.0)
MCHC: 31.7 g/dL (ref 30.0–36.0)
MCV: 78.7 fl (ref 78.0–100.0)
Monocytes Absolute: 0.4 10*3/uL (ref 0.1–1.0)
Monocytes Relative: 8.6 % (ref 3.0–12.0)
Neutro Abs: 2.2 10*3/uL (ref 1.4–7.7)
Neutrophils Relative %: 48.1 % (ref 43.0–77.0)
Platelets: 241 10*3/uL (ref 150.0–400.0)
RBC: 4.58 Mil/uL (ref 3.87–5.11)
RDW: 19.2 % — ABNORMAL HIGH (ref 11.5–15.5)
WBC: 4.5 10*3/uL (ref 4.0–10.5)

## 2014-02-27 LAB — HEMOGLOBIN A1C: Hgb A1c MFr Bld: 5.9 % (ref 4.6–6.5)

## 2014-02-27 MED ORDER — DULOXETINE HCL 30 MG PO CPEP: 30.0000 mg | ORAL_CAPSULE | Freq: Every day | ORAL | Status: DC

## 2014-02-27 NOTE — Assessment & Plan Note (Signed)
On metformin - The current medical regimen (metformin) is effective;  continue present plan and medications. Check a1c q5mo -adjust as needed Lab Results  Component Value Date   HGBA1C 6.1 09/28/2013

## 2014-02-27 NOTE — Progress Notes (Signed)
Subjective:    Patient ID: [name], female    DOB: 1930-05-15, 80 y.o.   MRN: [id number]  HPI  Patient is here for follow up  Reviewed chronic medical issues and interval medical events  Past Medical History  Diagnosis Date  . S/P CABG x 1   . S/P inguinal hernia repair   . 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 CAD, NATIVE VESSEL 06/09/2008  . UNSPECIFIED ANEMIA   . DIABETES MELLITUS, TYPE II   . GERD   . HYPERLIPIDEMIA   . HYPERTENSION   . HIATAL HERNIA   . FIBROMYALGIA   . OA (osteoarthritis)     severe, shoulders, hands - inflammatory, ?RA  . Gout   . Insomnia     Review of Systems  Constitutional: Positive for fatigue. Negative for fever.  Respiratory: Negative for cough and shortness of breath.   Musculoskeletal: Positive for myalgias, back pain, joint swelling (chronic) and arthralgias. Negative for gait problem and neck pain.       Objective:   Physical Exam  BP 130/74 mmHg  Pulse 56  Temp(Src) 98 F (36.7 C) (Oral)  Ht 4\' 11"  (1.499 m)  Wt 137 lb 4 oz (62.256 kg)  BMI 27.71 kg/m2  SpO2 98% Wt Readings from Last 3 Encounters:  02/27/14 137 lb 4 oz (62.256 kg)  01/31/14 143 lb 4 oz (64.978 kg)  01/25/14 144 lb 12 oz (65.658 kg)   Constitutional: She appears well-developed and well-nourished. No distress.  Neck: Normal range of motion. Neck supple. No JVD present. No thyromegaly present.  Cardiovascular: Normal rate, regular rhythm and normal heart sounds.  No murmur heard. No BLE edema. Pulmonary/Chest: Effort normal and breath sounds normal. No respiratory distress. She has no wheezes.  Psychiatric: She has a normal mood and affect. Her behavior is normal. Judgment and thought content normal.   Lab Results  Component Value Date   WBC 4.4 01/05/2014   HGB 9.1* 01/05/2014   HCT 29.3* 01/05/2014   PLT 210.0 01/05/2014   GLUCOSE 113* 01/05/2014   CHOL 196 01/05/2014     TRIG 82.0 01/05/2014   HDL 84.10 01/05/2014   LDLDIRECT 115.3 09/30/2009   LDLCALC 96 01/05/2014   ALT 18 06/09/2013   AST 26 01/05/2014   NA 134* 01/05/2014   K 4.4 01/05/2014   CL 100 01/05/2014   CREATININE 0.9 01/05/2014   BUN 10 01/05/2014   CO2 28 01/05/2014   TSH 0.77 06/09/2013   INR 0.99 11/06/2010   HGBA1C 6.1 09/28/2013   MICROALBUR 3.4* 12/22/2012    Dg Ankle Complete Right  12/15/2013   CLINICAL DATA:  Chronic pain and swelling of the right ankle without known injury  EXAM: RIGHT ANKLE - COMPLETE 3+ VIEW  COMPARISON:  Right foot series of today's date.  FINDINGS: The bones are osteopenic. The ankle joint mortise is preserved but the joint space is narrowed. There is a small spur noted from the anterior malleolar region of the tibia. There is no acute fracture or dislocation. There is diffuse soft tissue swelling. There are plantar and Achilles region calcaneal spurs. There is arterial calcification.  IMPRESSION: There are degenerative changes of the right ankle. There is no acute fracture nor dislocation.   Electronically Signed   By: David  Martinique   On: 12/15/2013 09:09   Dg Foot Complete Right  12/15/2013   CLINICAL DATA:  Chronic  great toe pain.  EXAM: RIGHT FOOT COMPLETE - 3+ VIEW  COMPARISON:  None.  FINDINGS: No fracture.  No dislocation.  There is a hallux valgus deformity of the great toe. First metatarsophalangeal joint is normally spaced and aligned. There are minimal marginal osteophytes at this joint. No periarticular osteopenia or erosion. There is mild bony prominence from the medial first metatarsal head consistent with a small bunion. There is overlying mild soft tissue swelling. Several small soft tissue calcifications lie adjacent to the first metatarsophalangeal joint, nonspecific.  Remaining joints are normally spaced and aligned. There are plantar and dorsal calcaneal spurs. Bones are demineralized.  IMPRESSION: 1. No fracture or acute finding. 2. Minor  first metatarsophalangeal joint osteoarthritis, small medial first metatarsal head bunion and hallux valgus deformity. Mild soft tissue swelling adjacent to the first metatarsophalangeal joint. No erosions.   Electronically Signed   By: Lajean Manes M.D.   On: 12/15/2013 09:11       Assessment & Plan:   Problem List Items Addressed This Visit    Anemia    Colo 04/2012 with diverticulosis, no mass/polyp Recheck CBC now Given weight loss (intentional? Reports no appetite but trying to eat less), if progressive decline or iron def, consider EGD Changed nexium to omeprazole because of cost and takes PPI daily Discussed again GI/bleeding risk of chronic NSAIDs -  pt understands risks and wishes to continue same Feel risk<benefit given disabling joint pain flares when off meloxicam -  Not on iron because of constipation    Relevant Orders      CBC with Differential   Fibromyalgia syndrome    Diffuse arthritic pains complicated by myalgias - hx inflammatory swelling when NOT on NSAIDs but no prior clarification of polyarthropathy cause Takes daily low-dose meloxicam for same Hopp added Cymbalta 01/2014 - ?improved - continue same Supportive care and physical therapy as ongoing -referral reviewed Working with rheum prn - no recent visits    Spinal stenosis of lumbar region - Primary    Significant spinal stenosis at L4-5 and L5-S1 on MRI December 2014 Has exhausted conservative therapy with sports medicine and unimproved with physical therapy ongoing follow up with spine specialist (saullo) reviewed - ?other evaluation and treatment of this problem, ? more ESI, RFA or decompression surgery? Discontinued Lyrica due to side effects continue titration of gabapentin with narcotic and Cymbalta (01/2014 start) as needed    Type 2 diabetes mellitus    On metformin - The current medical regimen (metformin) is effective;  continue present plan and medications. Check a1c q53mo -adjust as  needed Lab Results  Component Value Date   HGBA1C 6.1 09/28/2013      Relevant Orders      Hemoglobin A1c

## 2014-02-27 NOTE — Assessment & Plan Note (Signed)
Diffuse arthritic pains complicated by myalgias - hx inflammatory swelling when NOT on NSAIDs but no prior clarification of polyarthropathy cause Takes daily low-dose meloxicam for same Hopp added Cymbalta 01/2014 - ?improved - continue same Supportive care and physical therapy as ongoing -referral reviewed Working with rheum prn - no recent visits

## 2014-02-27 NOTE — Assessment & Plan Note (Signed)
Colo 04/2012 with diverticulosis, no mass/polyp Recheck CBC now Given weight loss (intentional? Reports no appetite but trying to eat less), if progressive decline or iron def, consider EGD Changed nexium to omeprazole because of cost and takes PPI daily Discussed again GI/bleeding risk of chronic NSAIDs -  pt understands risks and wishes to continue same Feel risk<benefit given disabling joint pain flares when off meloxicam -  Not on iron because of constipation

## 2014-02-27 NOTE — Patient Instructions (Signed)
It was good to see you today.  We have reviewed your prior records including labs and tests today  Test(s) ordered today. Your results will be released to [place] (or called to you) after review, usually within 72hours after test completion. If any changes need to be made, you will be notified at that same time.  Medications reviewed and updated, no changes recommended at this time. Refill on medication(s) as discussed today.  Please schedule followup in 6 months, call sooner if problems.

## 2014-02-27 NOTE — Progress Notes (Signed)
Pre visit review using our clinic review tool, if applicable. No additional management support is needed unless otherwise documented below in the visit note. 

## 2014-02-27 NOTE — Assessment & Plan Note (Signed)
Significant spinal stenosis at L4-5 and L5-S1 on MRI December 2014 Has exhausted conservative therapy with sports medicine and unimproved with physical therapy ongoing follow up with spine specialist (saullo) reviewed - ?other evaluation and treatment of this problem, ? more ESI, RFA or decompression surgery? Discontinued Lyrica due to side effects continue titration of gabapentin with narcotic and Cymbalta (01/2014 start) as needed

## 2014-03-08 DIAGNOSIS — M2550 Pain in unspecified joint: Secondary | ICD-10-CM | POA: Diagnosis not present

## 2014-03-08 DIAGNOSIS — M79643 Pain in unspecified hand: Secondary | ICD-10-CM | POA: Diagnosis not present

## 2014-03-08 DIAGNOSIS — M150 Primary generalized (osteo)arthritis: Secondary | ICD-10-CM | POA: Diagnosis not present

## 2014-03-08 DIAGNOSIS — R5383 Other fatigue: Secondary | ICD-10-CM | POA: Diagnosis not present

## 2014-03-20 ENCOUNTER — Ambulatory Visit
Admission: RE | Admit: 2014-03-20 | Discharge: 2014-03-20 | Disposition: A | Discharge status: Home / Self Care | Payer: Medicare Other | Source: Ambulatory Visit

## 2014-03-20 DIAGNOSIS — Z1231 Encounter for screening mammogram for malignant neoplasm of breast: Secondary | ICD-10-CM | POA: Diagnosis not present

## 2014-03-21 ENCOUNTER — Ambulatory Visit (INDEPENDENT_AMBULATORY_CARE_PROVIDER_SITE_OTHER): Payer: Medicare Other | Admitting: Podiatry

## 2014-03-21 ENCOUNTER — Encounter: Payer: Self-pay | Admitting: Podiatry

## 2014-03-21 VITALS — BP 145/68 | HR 61 | Ht 59.0 in | Wt 137.0 lb

## 2014-03-21 DIAGNOSIS — M6588 Other synovitis and tenosynovitis, other site: Secondary | ICD-10-CM | POA: Diagnosis not present

## 2014-03-21 DIAGNOSIS — M21619 Bunion of unspecified foot: Secondary | ICD-10-CM | POA: Insufficient documentation

## 2014-03-21 DIAGNOSIS — M65979 Unspecified synovitis and tenosynovitis, unspecified ankle and foot: Secondary | ICD-10-CM

## 2014-03-21 DIAGNOSIS — M2010 Hallux valgus (acquired), unspecified foot: Secondary | ICD-10-CM

## 2014-03-21 DIAGNOSIS — E1140 Type 2 diabetes mellitus with diabetic neuropathy, unspecified: Secondary | ICD-10-CM | POA: Insufficient documentation

## 2014-03-21 DIAGNOSIS — M659 Synovitis and tenosynovitis, unspecified: Secondary | ICD-10-CM

## 2014-03-21 DIAGNOSIS — E0841 Diabetes mellitus due to underlying condition with diabetic mononeuropathy: Secondary | ICD-10-CM

## 2014-03-21 DIAGNOSIS — M216X9 Other acquired deformities of unspecified foot: Secondary | ICD-10-CM | POA: Diagnosis not present

## 2014-03-21 NOTE — Progress Notes (Signed)
Subjective: 80 year old female presents complaining of pain on right ankle and top of foot. Also having tingling sensation on left foot.  Patient is diabetic and blood sugar is under control.   Objective: Dermatologic: Within normal. Hypertrophic nails x 10. Vascular: All pedal pulses are palpable. Right DP is weaker than the left. Neuro: Normal response to Monofilament testing on both feet. Subjective numbness and tingling on left foot.  Orthopedic: Severe bunion bilateral. Contracted lesser digits bilateral.  Assessment: Hallux valgus with bilateral bunion deformity. Severe pronation STJ bilateral.  Plan: Reviewed findings and available options. Advised that she need well supported tennis shoes.  Both feet measured for diabetic shoes.

## 2014-03-21 NOTE — Patient Instructions (Signed)
Seen for pain in right foot and ankle. Noted of changes in metatarsal bone with bunion deformity.  Both feet measured for Diabetic shoes.

## 2014-03-23 ENCOUNTER — Other Ambulatory Visit: Payer: Self-pay | Admitting: Internal Medicine

## 2014-04-04 ENCOUNTER — Encounter: Payer: Medicare Other | Admitting: Internal Medicine

## 2014-04-05 DIAGNOSIS — M2550 Pain in unspecified joint: Secondary | ICD-10-CM | POA: Diagnosis not present

## 2014-04-05 DIAGNOSIS — M150 Primary generalized (osteo)arthritis: Secondary | ICD-10-CM | POA: Diagnosis not present

## 2014-04-05 DIAGNOSIS — R5383 Other fatigue: Secondary | ICD-10-CM | POA: Diagnosis not present

## 2014-04-05 DIAGNOSIS — M1180 Other specified crystal arthropathies, unspecified site: Secondary | ICD-10-CM | POA: Diagnosis not present

## 2014-04-06 ENCOUNTER — Telehealth: Payer: Self-pay | Admitting: Internal Medicine

## 2014-04-06 NOTE — Telephone Encounter (Signed)
Pt request phone call from the assistant concern about diabetic shoes supply paper work. Please call pt

## 2014-04-06 NOTE — Telephone Encounter (Signed)
Called pt back and informed that the form was faxed back and the original was mailed to her.  I was able to locate the fax, it has not been scanned to media.   Called diabetic shoe supply office [phone]), they stated that they are on epic and will look for it in the scan folder.

## 2014-04-17 ENCOUNTER — Encounter (HOSPITAL_COMMUNITY): Payer: Self-pay

## 2014-04-17 ENCOUNTER — Emergency Department (HOSPITAL_COMMUNITY): Payer: Medicare Other

## 2014-04-17 ENCOUNTER — Encounter: Payer: Self-pay | Admitting: Internal Medicine

## 2014-04-17 ENCOUNTER — Ambulatory Visit (INDEPENDENT_AMBULATORY_CARE_PROVIDER_SITE_OTHER): Payer: Medicare Other | Admitting: Internal Medicine

## 2014-04-17 ENCOUNTER — Emergency Department (HOSPITAL_COMMUNITY)
Admission: EM | Admit: 2014-04-17 | Discharge: 2014-04-17 | Disposition: A | Discharge status: Home / Self Care | Payer: Medicare Other | Attending: Emergency Medicine | Admitting: Emergency Medicine

## 2014-04-17 VITALS — BP 170/100 | HR 93 | Temp 98.3°F | Ht 59.0 in | Wt 136.0 lb

## 2014-04-17 DIAGNOSIS — E785 Hyperlipidemia, unspecified: Secondary | ICD-10-CM | POA: Insufficient documentation

## 2014-04-17 DIAGNOSIS — I2510 Atherosclerotic heart disease of native coronary artery without angina pectoris: Secondary | ICD-10-CM | POA: Diagnosis not present

## 2014-04-17 DIAGNOSIS — R079 Chest pain, unspecified: Secondary | ICD-10-CM | POA: Diagnosis not present

## 2014-04-17 DIAGNOSIS — Z7982 Long term (current) use of aspirin: Secondary | ICD-10-CM | POA: Diagnosis not present

## 2014-04-17 DIAGNOSIS — Z79899 Other long term (current) drug therapy: Secondary | ICD-10-CM | POA: Insufficient documentation

## 2014-04-17 DIAGNOSIS — R072 Precordial pain: Secondary | ICD-10-CM | POA: Diagnosis not present

## 2014-04-17 DIAGNOSIS — M1990 Unspecified osteoarthritis, unspecified site: Secondary | ICD-10-CM | POA: Insufficient documentation

## 2014-04-17 DIAGNOSIS — I10 Essential (primary) hypertension: Secondary | ICD-10-CM | POA: Insufficient documentation

## 2014-04-17 DIAGNOSIS — Z87891 Personal history of nicotine dependence: Secondary | ICD-10-CM | POA: Insufficient documentation

## 2014-04-17 DIAGNOSIS — M25512 Pain in left shoulder: Secondary | ICD-10-CM | POA: Insufficient documentation

## 2014-04-17 DIAGNOSIS — K219 Gastro-esophageal reflux disease without esophagitis: Secondary | ICD-10-CM | POA: Diagnosis not present

## 2014-04-17 DIAGNOSIS — Z791 Long term (current) use of non-steroidal anti-inflammatories (NSAID): Secondary | ICD-10-CM | POA: Diagnosis not present

## 2014-04-17 DIAGNOSIS — M542 Cervicalgia: Secondary | ICD-10-CM | POA: Diagnosis not present

## 2014-04-17 DIAGNOSIS — E119 Type 2 diabetes mellitus without complications: Secondary | ICD-10-CM | POA: Diagnosis not present

## 2014-04-17 DIAGNOSIS — D649 Anemia, unspecified: Secondary | ICD-10-CM | POA: Diagnosis not present

## 2014-04-17 DIAGNOSIS — G4700 Insomnia, unspecified: Secondary | ICD-10-CM | POA: Insufficient documentation

## 2014-04-17 DIAGNOSIS — Z86010 Personal history of colonic polyps: Secondary | ICD-10-CM | POA: Diagnosis not present

## 2014-04-17 DIAGNOSIS — Z951 Presence of aortocoronary bypass graft: Secondary | ICD-10-CM | POA: Diagnosis not present

## 2014-04-17 DIAGNOSIS — R0789 Other chest pain: Secondary | ICD-10-CM

## 2014-04-17 DIAGNOSIS — M797 Fibromyalgia: Secondary | ICD-10-CM | POA: Diagnosis not present

## 2014-04-17 LAB — CBC WITH DIFFERENTIAL/PLATELET
Basophils Absolute: 0 10*3/uL (ref 0.0–0.1)
Basophils Relative: 0 % (ref 0–1)
Eosinophils Absolute: 0.1 10*3/uL (ref 0.0–0.7)
Eosinophils Relative: 1 % (ref 0–5)
HCT: 34.6 % — ABNORMAL LOW (ref 36.0–46.0)
Hemoglobin: 10.6 g/dL — ABNORMAL LOW (ref 12.0–15.0)
Lymphocytes Relative: 23 % (ref 12–46)
Lymphs Abs: 1.6 10*3/uL (ref 0.7–4.0)
MCH: 25.1 pg — ABNORMAL LOW (ref 26.0–34.0)
MCHC: 30.6 g/dL (ref 30.0–36.0)
MCV: 82 fL (ref 78.0–100.0)
Monocytes Absolute: 0.4 10*3/uL (ref 0.1–1.0)
Monocytes Relative: 6 % (ref 3–12)
Neutro Abs: 4.9 10*3/uL (ref 1.7–7.7)
Neutrophils Relative %: 70 % (ref 43–77)
Platelets: 207 10*3/uL (ref 150–400)
RBC: 4.22 MIL/uL (ref 3.87–5.11)
RDW: 17.3 % — ABNORMAL HIGH (ref 11.5–15.5)
WBC: 7 10*3/uL (ref 4.0–10.5)

## 2014-04-17 LAB — URINALYSIS, ROUTINE W REFLEX MICROSCOPIC
Bilirubin Urine: NEGATIVE
Glucose, UA: NEGATIVE mg/dL
Hgb urine dipstick: NEGATIVE
Ketones, ur: NEGATIVE mg/dL
Nitrite: NEGATIVE
Protein, ur: 30 mg/dL — AB
Specific Gravity, Urine: 1.012 (ref 1.005–1.030)
Urobilinogen, UA: 0.2 mg/dL (ref 0.0–1.0)
pH: 7.5 (ref 5.0–8.0)

## 2014-04-17 LAB — COMPREHENSIVE METABOLIC PANEL
ALT: 21 U/L (ref 0–35)
AST: 30 U/L (ref 0–37)
Albumin: 4.1 g/dL (ref 3.5–5.2)
Alkaline Phosphatase: 66 U/L (ref 39–117)
Anion gap: 10 (ref 5–15)
BUN: 10 mg/dL (ref 6–23)
CO2: 26 mmol/L (ref 19–32)
Calcium: 9.6 mg/dL (ref 8.4–10.5)
Chloride: 103 mEq/L (ref 96–112)
Creatinine, Ser: 0.74 mg/dL (ref 0.50–1.10)
GFR calc Af Amer: >90 mL/min (ref >90)
GFR calc non Af Amer: 78 mL/min — ABNORMAL LOW (ref >90)
Glucose, Bld: 118 mg/dL — ABNORMAL HIGH (ref 70–99)
Potassium: 3.4 mmol/L — ABNORMAL LOW (ref 3.5–5.1)
Sodium: 139 mmol/L (ref 135–145)
Total Bilirubin: 0.8 mg/dL (ref 0.3–1.2)
Total Protein: 7.5 g/dL (ref 6.0–8.3)

## 2014-04-17 LAB — I-STAT TROPONIN, ED
Troponin i, poc: 0 ng/mL (ref 0.00–0.08)
Troponin i, poc: 0 ng/mL (ref 0.00–0.08)

## 2014-04-17 LAB — URINE MICROSCOPIC-ADD ON

## 2014-04-17 MED ORDER — RAMIPRIL 5 MG PO CAPS
5.0000 mg | ORAL_CAPSULE | Freq: Every day | ORAL | Status: DC
  Administered 2014-04-17: 5 mg via ORAL
  Filled 2014-04-17 (×2): qty 1

## 2014-04-17 MED ORDER — AMLODIPINE BESYLATE 5 MG PO TABS
5.0000 mg | ORAL_TABLET | Freq: Once | ORAL | Status: AC
  Administered 2014-04-17: 5 mg via ORAL
  Filled 2014-04-17: qty 1

## 2014-04-17 MED ORDER — OXYCODONE HCL 5 MG PO TABS
5.0000 mg | ORAL_TABLET | ORAL | Status: AC
  Administered 2014-04-17: 5 mg via ORAL
  Filled 2014-04-17: qty 1

## 2014-04-17 MED ORDER — OXYCODONE HCL 5 MG PO TABS: 2.5000 mg | ORAL_TABLET | Freq: Four times a day (QID) | ORAL | Status: DC | PRN

## 2014-04-17 NOTE — Progress Notes (Signed)
Subjective:    Patient ID: [name], female    DOB: [birth year], 80 y.o.   MRN: [id number]  Cc: chest pain  Patient is a 80 year old with a history of CAD (s/p CABG in 2010), HTN, HL and fibromyalgia She drove here  Chest Pain  This is a new problem. The current episode started yesterday. The onset quality is sudden. The problem occurs constantly. The problem has been unchanged. The pain is present in the substernal region and lateral region. The pain is severe. The quality of the pain is described as sharp. The pain radiates to the left arm, left neck, left shoulder, mid back and precordial region. Associated symptoms include back pain and shortness of breath. Pertinent negatives include no abdominal pain, cough, diaphoresis, dizziness, headaches, nausea, numbness, palpitations, vomiting or weakness. The pain is aggravated by movement.  Her past medical history is significant for CAD.    Past Medical History  Diagnosis Date  . S/P CABG x 1   . S/P inguinal hernia repair   . TUBULOVILLOUS ADENOMA, 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    colo 04/2012 - no polyps - no further screening planned (age >97)  . CAD, NATIVE VESSEL 06/09/2008  . UNSPECIFIED ANEMIA   . DIABETES MELLITUS, TYPE II   . GERD   . HYPERLIPIDEMIA   . HYPERTENSION   . HIATAL HERNIA   . FIBROMYALGIA   . OA (osteoarthritis)     severe, shoulders, hands - inflammatory, ?RA  . Gout   . Insomnia    Past Surgical History  Procedure Laterality Date  . Hernia repair    . Abdominal hysterectomy  1981  . Coronary artery bypass graft  2010  . Appendectomy    . Replacement total knee bilateral Bilateral     Rt-1974, Lt -1989  . Rotator cuff repair      Left  . Ovarian cyst removal    . Cataract extraction, bilateral    . Reverse shoulder arthroplasty  09/08/2011    Procedure: REVERSE SHOULDER ARTHROPLASTY;  Surgeon: Nita Sells, MD;  Location: [facility];  Service: Orthopedics;  Laterality: Left;  left reverse total  shoulder arthroplasty  . Joint replacement      reports that she quit smoking about 44 years ago. She has never used smokeless tobacco. She reports that she drinks about 0.6 oz of alcohol per week. She reports that she does not use illicit drugs. family history includes Cancer in her brother and mother; Diabetes in her brother, daughter, father, and sister; Heart attack in her brother and father; Heart disease in her sister; Heart disease (age of onset: 68) in her father; Hypertension in her daughter and mother; Stomach cancer (age of onset: 2) in her mother. There is no history of Anesthesia problems or Colon cancer. No Known Allergies  Current Outpatient Prescriptions on File Prior to Visit  Medication Sig Dispense Refill  . amLODipine (NORVASC) 5 MG tablet TAKE 1 TABLET DAILY 90 tablet 3  . aspirin EC 81 MG tablet Take 81 mg by mouth daily.    Marland Kitchen atorvastatin (LIPITOR) 40 MG tablet Take 40 mg by mouth at bedtime.    . carvedilol (COREG) 6.25 MG tablet 1 & 1/2 bid    . cetirizine (ZYRTEC) 10 MG tablet Take 10 mg by mouth at bedtime.    . cetirizine (ZYRTEC) 10 MG tablet TAKE 1 TABLET (10 MG TOTAL) BY MOUTH DAILY. 30 tablet 3  . Cyanocobalamin (VITAMIN B-12 PO) Take 1 tablet by  mouth daily with breakfast.     . DULoxetine (CYMBALTA) 30 MG capsule Take 1 capsule (30 mg total) by mouth daily. 30 capsule 11  . gabapentin (NEURONTIN) 100 MG capsule Take 1 capsule (100 mg total) by mouth 4 (four) times daily. 120 capsule 5  . meloxicam (MOBIC) 7.5 MG tablet Take 7.5 mg by mouth daily with breakfast.     . meloxicam (MOBIC) 7.5 MG tablet TAKE 1 TABLET BY MOUTH DAILY 30 tablet 3  . metFORMIN (GLUCOPHAGE-XR) 500 MG 24 hr tablet TAKE 1 TABLET DAILY WITH   BREAKFAST 90 tablet 1  . Multiple Vitamin (MULITIVITAMIN WITH MINERALS) TABS Take 1 tablet by mouth daily with breakfast.     . omeprazole (PRILOSEC) 40 MG capsule TAKE 1 CAPSULE (40 MG TOTAL) BY MOUTH DAILY. 90 capsule 2  . oxycodone-acetaminophen  (PERCOCET) 2.5-325 MG per tablet Take 1 tablet by mouth every 4 (four) hours as needed for pain. 30 tablet 0  . polyethylene glycol (MIRALAX / GLYCOLAX) packet Take 17 g by mouth at bedtime. for constipation    . ramipril (ALTACE) 5 MG capsule Take 1 capsule (5 mg total) by mouth daily. 90 capsule 3  . senna (SENOKOT) 8.6 MG TABS Take 2-3 tablets by mouth as needed. For constipation    . VITAMIN E PO Take 1 capsule by mouth daily with breakfast.      No current facility-administered medications on file prior to visit.     Review of Systems  Constitutional: Negative for chills, diaphoresis, activity change, appetite change, fatigue and unexpected weight change.  HENT: Negative for congestion, mouth sores and sinus pressure.   Eyes: Negative for visual disturbance.  Respiratory: Positive for chest tightness and shortness of breath. Negative for apnea and cough.   Cardiovascular: Negative for palpitations and leg swelling.       10/10 pain  Gastrointestinal: Negative for nausea, vomiting, abdominal pain and diarrhea.  Genitourinary: Negative for frequency, difficulty urinating and vaginal pain.  Musculoskeletal: Positive for myalgias, back pain, arthralgias, gait problem, neck pain and neck stiffness.  Skin: Negative for pallor and rash.  Neurological: Negative for dizziness, tremors, weakness, numbness and headaches.  Psychiatric/Behavioral: Negative for confusion and sleep disturbance. The patient is nervous/anxious.        Objective:   Physical Exam  Constitutional: She appears well-developed. No distress.  In distress due to pain  HENT:  Head: Normocephalic.  Right Ear: External ear normal.  Left Ear: External ear normal.  Nose: Nose normal.  Mouth/Throat: Oropharynx is clear and moist.  Eyes: Conjunctivae are normal. Pupils are equal, round, and reactive to light. Right eye exhibits no discharge. Left eye exhibits no discharge.  Neck: Normal range of motion. Neck supple. No JVD  present. No tracheal deviation present. No thyromegaly present.  Cardiovascular: Normal rate, regular rhythm and normal heart sounds.   Pulmonary/Chest: No stridor. No respiratory distress. She has no wheezes.  Abdominal: Soft. Bowel sounds are normal. She exhibits no distension and no mass. There is no tenderness. There is no rebound and no guarding.  Musculoskeletal: She exhibits tenderness. She exhibits no edema.  Cane  Lymphadenopathy:    She has no cervical adenopathy.  Neurological: She displays normal reflexes. No cranial nerve deficit. She exhibits normal muscle tone. Coordination normal.  Skin: No rash noted. No erythema.  Psychiatric: She has a normal mood and affect. Her behavior is normal. Judgment and thought content normal.  No pulsating mass Periph pulses are symmetric  EKG  Assessment & Plan:

## 2014-04-17 NOTE — Discharge Instructions (Signed)
Please read and follow all provided instructions.  Your diagnoses today include:  1. Chest wall pain   2. Chest pain     Tests performed today include:  An EKG of your heart  A chest x-ray - normal  Cardiac enzymes - a blood test for heart muscle damage, were normal  Blood counts and electrolytes  Vital signs. See below for your results today.   Medications prescribed:   Oxycodone - narcotic pain medication  DO NOT drive or perform any activities that require you to be awake and alert because this medicine can make you drowsy.   Take any prescribed medications only as directed.  Follow-up instructions: Please follow-up with your primary care provider as soon as you can for further evaluation of your symptoms.   Return instructions:  SEEK IMMEDIATE MEDICAL ATTENTION IF:  You have severe chest pain, especially if the pain is crushing or pressure-like and spreads to the arms, back, neck, or jaw, or if you have sweating, nausea (feeling sick to your stomach), or shortness of breath. THIS IS AN EMERGENCY. Don't wait to see if the pain will go away. Get medical help at once. Call 911 or 0 (operator). DO NOT drive yourself to the hospital.   Your chest pain gets worse and does not go away with rest.   You have an attack of chest pain lasting longer than usual, despite rest and treatment with the medications your caregiver has prescribed.   You wake from sleep with chest pain or shortness of breath.  You feel dizzy or faint.  You have chest pain not typical of your usual pain for which you originally saw your caregiver.   You have any other emergent concerns regarding your health.  Additional Information: Chest pain comes from many different causes. Your caregiver has diagnosed you as having chest pain that is not specific for one problem, but does not require admission.  You are at low risk for an acute heart condition or other serious illness.   Your vital signs today  were: BP 175/84 mmHg   Pulse 80   Temp(Src) 97.9 F (36.6 C) (Oral)   Resp 16   SpO2 98% If your blood pressure (BP) was elevated above 135/85 this visit, please have this repeated by your doctor within one month. --------------

## 2014-04-17 NOTE — ED Notes (Addendum)
Lt. Side chest pain began yesterday and radiates into her lt. Back area.  Pain began yesterday morning and became pain all over.  This morning the pain began again same type of pain lt. Chest pain into lt. Back area.  Pt. Is alert and oriented X4.  Pt. Denies any sob , cough or congestion.  Denies any n/v/d Denies any dizziness The pain is a 5/10 movement and touch increases the pain.  324 mg ASA given.  1 nitro, ineffective. When pt. Turns her neck and moves her head back and forth, pain increases.

## 2014-04-17 NOTE — Assessment & Plan Note (Addendum)
04/17/14 severe: r/o CAD, aneurism, PE etc EKG NTG SL ASA 325 To [facility] by ambulance

## 2014-04-17 NOTE — ED Notes (Signed)
Pt endorsed increased chest pain when BP cuff was going off. RN sts at present time pain is still 5/10. Pt denies taking any of her meds this morning- including BP meds. Josh PA made aware.

## 2014-04-17 NOTE — ED Provider Notes (Signed)
CSN: [id number]     Arrival date & time 04/17/14  1202 History   First MD Initiated Contact with Patient 04/17/14 1209     Chief Complaint  Patient presents with  . Chest Pain     (Consider location/radiation/quality/duration/timing/severity/associated sxs/prior Treatment) HPI Comments: Patient with history of CAD, status post triple bypass surgery -- presents with complaint of left chest pain. Patient states that the pain began yesterday morning as a pain towards her neck which then moved down into her left anterior chest. She has radiation to her left shoulder blade. She was seen by her PCP today and sent to the emergency department for evaluation. Pain is readily reproducible with palpation of the chest wall and movement of her left arm. It is also made worse when she moves her head. She denies diaphoresis, palpitations, shortness of breath. No history of blood clots, recent surgery or recent travel. Deep breathing does not make the pain worse. She denies fever, cough, congestion. No lightheadedness or dizziness. Prior to arrival, patient was given 4 baby aspirin, one nitroglycerin without any relief.  Patient is a 80 y.o. female presenting with chest pain. The history is provided by the patient, medical records and a relative.  Chest Pain Associated symptoms: back pain (L shoulder blade)   Associated symptoms: no abdominal pain, no cough, no diaphoresis, no fever, no nausea, no palpitations, no shortness of breath and not vomiting     Past Medical History  Diagnosis Date  . S/P CABG x 1   . S/P inguinal hernia repair   . 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 CAD, NATIVE VESSEL 06/09/2008  . UNSPECIFIED ANEMIA   . DIABETES MELLITUS, TYPE II   . GERD   . HYPERLIPIDEMIA   . HYPERTENSION   . HIATAL HERNIA   . FIBROMYALGIA   . OA (osteoarthritis)     severe, shoulders, hands - inflammatory, ?RA  . Gout   .  Insomnia    Past Surgical History  Procedure Laterality Date  . Hernia repair    . Abdominal hysterectomy  1981  . Coronary artery bypass graft  2010  . Appendectomy    . Replacement total knee bilateral Bilateral     Rt-1974, Lt -1989  . Rotator cuff repair      Left  . Ovarian cyst removal    . Cataract extraction, bilateral    . Reverse shoulder arthroplasty  09/08/2011    Procedure: REVERSE SHOULDER ARTHROPLASTY;  Surgeon: Nita Sells, MD;  Location: [place];  Service: Orthopedics;  Laterality: Left;  left reverse total shoulder arthroplasty  . Joint replacement     Family History  Problem Relation Age of Onset  . Cancer Mother     Stomach  . Stomach cancer Mother 22  . Hypertension Mother   . Heart disease Father 50    MI  . Diabetes Father   . Heart attack Father   . Diabetes Sister   . Heart disease Sister   . Heart attack Brother   . Cancer Brother   . Diabetes Brother   . Anesthesia problems Neg Hx   . Colon cancer Neg Hx   . Diabetes Daughter   . Hypertension Daughter    History  Substance Use Topics  . Smoking status: Former Smoker    Quit date: 04/26/1970  . Smokeless tobacco: Never Used  . Alcohol Use: 0.6 oz/week  1 Glasses of wine per week   OB History    No data available     Review of Systems  Constitutional: Negative for fever and diaphoresis.  Eyes: Negative for redness.  Respiratory: Negative for cough and shortness of breath.   Cardiovascular: Positive for chest pain. Negative for palpitations and leg swelling.  Gastrointestinal: Negative for nausea, vomiting and abdominal pain.  Genitourinary: Negative for dysuria.  Musculoskeletal: Positive for back pain (L shoulder blade) and neck pain.  Skin: Negative for rash.  Neurological: Negative for syncope and light-headedness.    Allergies  Review of patient's allergies indicates no known allergies.  Home Medications   Prior to Admission medications   Medication Sig Start  Date End Date Taking? Authorizing Provider  amLODipine (NORVASC) 5 MG tablet TAKE 1 TABLET DAILY 01/31/14   Rowe Clack, MD  aspirin EC 81 MG tablet Take 81 mg by mouth daily.    Historical Provider, MD  atorvastatin (LIPITOR) 40 MG tablet Take 40 mg by mouth at bedtime.    Historical Provider, MD  carvedilol (COREG) 6.25 MG tablet 1 & 1/2 bid 01/12/14   Hendricks Limes, MD  cetirizine (ZYRTEC) 10 MG tablet Take 10 mg by mouth at bedtime.    Historical Provider, MD  cetirizine (ZYRTEC) 10 MG tablet TAKE 1 TABLET (10 MG TOTAL) BY MOUTH DAILY. 03/26/14   Rowe Clack, MD  Cyanocobalamin (VITAMIN B-12 PO) Take 1 tablet by mouth daily with breakfast.     Historical Provider, MD  DULoxetine (CYMBALTA) 30 MG capsule Take 1 capsule (30 mg total) by mouth daily. 02/27/14   Rowe Clack, MD  gabapentin (NEURONTIN) 100 MG capsule Take 1 capsule (100 mg total) by mouth 4 (four) times daily. 01/25/14   Rowe Clack, MD  meloxicam (MOBIC) 7.5 MG tablet Take 7.5 mg by mouth daily with breakfast.     Historical Provider, MD  meloxicam (MOBIC) 7.5 MG tablet TAKE 1 TABLET BY MOUTH DAILY 03/26/14   Rowe Clack, MD  metFORMIN (GLUCOPHAGE-XR) 500 MG 24 hr tablet TAKE 1 TABLET DAILY WITH   BREAKFAST 12/18/13   Rowe Clack, MD  Multiple Vitamin (MULITIVITAMIN WITH MINERALS) TABS Take 1 tablet by mouth daily with breakfast.     Historical Provider, MD  omeprazole (PRILOSEC) 40 MG capsule TAKE 1 CAPSULE (40 MG TOTAL) BY MOUTH DAILY. 02/12/14   Rowe Clack, MD  oxycodone-acetaminophen (PERCOCET) 2.5-325 MG per tablet Take 1 tablet by mouth every 4 (four) hours as needed for pain. 11/29/13   Drucilla Schmidt, MD  polyethylene glycol [facility]) packet Take 17 g by mouth at bedtime. for constipation    Historical Provider, MD  ramipril (ALTACE) 5 MG capsule Take 1 capsule (5 mg total) by mouth daily. 10/17/13   Rowe Clack, MD  senna (SENOKOT) 8.6 MG TABS Take 2-3 tablets  by mouth as needed. For constipation    Historical Provider, MD  VITAMIN E PO Take 1 capsule by mouth daily with breakfast.     Historical Provider, MD   BP 180/85 mmHg  Pulse 74  Temp(Src) 97.9 F (36.6 C) (Oral)  Resp 13  SpO2 100%   Physical Exam  Constitutional: She appears well-developed and well-nourished.  HENT:  Head: Normocephalic and atraumatic.  Mouth/Throat: Mucous membranes are normal. Mucous membranes are not dry.  Eyes: Conjunctivae are normal.  Neck: Trachea normal and normal range of motion. Neck supple. Normal carotid pulses and no JVD present. No  muscular tenderness present. Carotid bruit is not present. No tracheal deviation present.  Cardiovascular: Normal rate, regular rhythm, S1 normal, S2 normal, normal heart sounds and intact distal pulses.  Exam reveals no decreased pulses.   No murmur heard. Pulmonary/Chest: Effort normal. No respiratory distress. She has no wheezes. She exhibits tenderness (anterior left chest).  Abdominal: Soft. Normal aorta and bowel sounds are normal. There is no tenderness. There is no rebound and no guarding.  Musculoskeletal: Normal range of motion.  Neurological: She is alert.  Skin: Skin is warm and dry. She is not diaphoretic. No cyanosis. No pallor.  Psychiatric: She has a normal mood and affect.  Nursing note and vitals reviewed.   ED Course  Procedures (including critical care time) Labs Review Labs Reviewed  CBC WITH DIFFERENTIAL - Abnormal; Notable for the following:    Hemoglobin 10.6 (*)    HCT 34.6 (*)    MCH 25.1 (*)    RDW 17.3 (*)    All other components within normal limits  COMPREHENSIVE METABOLIC PANEL - Abnormal; Notable for the following:    Potassium 3.4 (*)    Glucose, Bld 118 (*)    GFR calc non Af Amer 78 (*)    All other components within normal limits  URINALYSIS, ROUTINE W REFLEX MICROSCOPIC - Abnormal; Notable for the following:    Protein, ur 30 (*)    Leukocytes, UA TRACE (*)    All other  components within normal limits  URINE MICROSCOPIC-ADD ON  Randolm Idol, ED  Randolm Idol, ED    Imaging Review Dg Chest 2 View  04/17/2014   CLINICAL DATA:  Left-sided chest pain radiating into left back.  EXAM: CHEST - 2 VIEW  FINDINGS: The heart size and mediastinal contours are within normal limits. Stable appearance post CABG. Stable mild scarring at the left lateral lung base. There is no evidence of pulmonary edema, consolidation, pneumothorax, nodule or pleural fluid. The visualized skeletal structures are unremarkable.  IMPRESSION: No active disease.   Electronically Signed   By: Aletta Edouard M.D.   On: 04/17/2014 13:26     EKG Interpretation   Date/Time:  Tuesday April 17 2014 12:13:22 EST Ventricular Rate:  71 PR Interval:  136 QRS Duration: 98 QT Interval:  385 QTC Calculation: 418 R Axis:   -7 Text Interpretation:  Sinus rhythm Ventricular premature complex Left  ventricular hypertrophy Premature ventricular complexes No significant  change since last tracing Confirmed by HARRISON  MD, FORREST ([id number]) on  04/17/2014 12:20:37 PM       12:36 PM Patient seen and examined. Reviewed PCP note. Work-up initiated. ASA given prior. I reviewed EKG.    Vital signs reviewed and are as follows: BP 180/85 mmHg  Pulse 74  Temp(Src) 97.9 F (36.6 C) (Oral)  Resp 13  SpO2 100%   3:47 PM Patient seen previously by Dr. Aline Brochure. Agrees that pain is reproducible. Will check delta troponin to ensure no changes. If this and repeat EKG are negative, patient can be discharged to home.   3:48 PM Patient was counseled to return with severe chest pain, especially if the pain is crushing or pressure-like and spreads to the arms, back, neck, or jaw, or if they have sweating, nausea, or shortness of breath with the pain. They were encouraged to call 911 with these symptoms.   They were also told to return if their chest pain gets worse and does not go away with rest, they have  an attack of chest  pain lasting longer than usual despite rest and treatment with the medications their caregiver has prescribed, if they wake from sleep with chest pain or shortness of breath, if they feel dizzy or faint, if they have chest pain not typical of their usual pain, or if they have any other emergent concerns regarding their health.  The patient verbalized understanding and agreed.   4:13 PM Delta trop neg. EKG unchanged. Pt and daughter comfortable with discharge to home.    MDM   Final diagnoses:  Chest pain  Chest wall pain   Patient with chest tightness. It is reproducible without question. Feel patient is low risk for new ACS given history (poor story for ACS/MI), negative troponin(s), normal/unchanged EKG. Do not suspect PE, dissection, thoracic AA. Patient appears well. Will d/c to home with PCP f/u.        Carlisle Cater, PA-C 04/17/14 [place], MD 04/18/14 [id number]

## 2014-04-17 NOTE — ED Notes (Signed)
Pt. Came directly from her MD appointment at [facility].

## 2014-04-18 ENCOUNTER — Encounter: Payer: Medicare Other | Admitting: Family

## 2014-04-18 ENCOUNTER — Encounter: Payer: Medicare Other | Admitting: Internal Medicine

## 2014-04-19 ENCOUNTER — Ambulatory Visit: Payer: Medicare Other | Admitting: Family

## 2014-04-24 ENCOUNTER — Encounter: Payer: Self-pay | Admitting: Internal Medicine

## 2014-04-24 ENCOUNTER — Ambulatory Visit (INDEPENDENT_AMBULATORY_CARE_PROVIDER_SITE_OTHER): Payer: Medicare Other | Admitting: Internal Medicine

## 2014-04-24 VITALS — BP 182/80 | HR 66 | Temp 97.9°F | Ht 59.0 in | Wt 139.5 lb

## 2014-04-24 DIAGNOSIS — G8929 Other chronic pain: Secondary | ICD-10-CM

## 2014-04-24 DIAGNOSIS — M5416 Radiculopathy, lumbar region: Secondary | ICD-10-CM

## 2014-04-24 DIAGNOSIS — I10 Essential (primary) hypertension: Secondary | ICD-10-CM

## 2014-04-24 DIAGNOSIS — M545 Low back pain, unspecified: Secondary | ICD-10-CM

## 2014-04-24 MED ORDER — DULOXETINE HCL 30 MG PO CPEP: 30.0000 mg | ORAL_CAPSULE | Freq: Every day | ORAL | Status: DC

## 2014-04-24 MED ORDER — OXYCODONE HCL 5 MG PO TABS: 2.5000 mg | ORAL_TABLET | Freq: Four times a day (QID) | ORAL | Status: DC | PRN

## 2014-04-24 MED ORDER — GABAPENTIN 100 MG PO CAPS: ORAL_CAPSULE | ORAL | Status: DC

## 2014-04-24 MED ORDER — RAMIPRIL 5 MG PO CAPS: ORAL_CAPSULE | ORAL | Status: DC

## 2014-04-24 NOTE — Patient Instructions (Addendum)
   Please take Ramipril  5 mg 2 pills daily and monitor blood pressure.Minimal Blood Pressure Goal= AVERAGE < 140/90;  Ideal is an AVERAGE < 135/85. This AVERAGE should be calculated from @ least 5-7 BP readings taken @ different times of day on different days of week. You should not respond to isolated BP readings , but rather the AVERAGE for that week .Please bring your  blood pressure cuff to office visits to verify that it is reliable.It  can also be checked against the blood pressure device at the pharmacy. Finger or wrist cuffs are not dependable; an arm cuff is.  Please see your back specialist for the chronic back symptoms AS SOON AS POSSIBLE ! Note: she is requesting second opinion re: back issues; Dr Nelva Bush will be consulted.

## 2014-04-24 NOTE — Progress Notes (Signed)
   Subjective:    Patient ID: [name], female    DOB: Feb 17, 1931, 81 y.o.   MRN: [id number]  HPI  She was seen in emergency room 04/17/14 for chest pain. Acute coronary syndrome was ruled out. She was asked to follow-up  Today she has no complaints of chest pain. She is complaining of recurrent lumbosacral area pain with radiation down both legs.  This is a chronic issue for which she has seen Dr Maia Petties.  The pain is aggravated by any activity such as lifting, sitting, standing, bending at the waist, or driving.  She states the pain radiates down both sides of the hips into the thighs and lower legs as far as the ventral surface of the feet.  She states the pain will begin early in the morning and persist up until 2 PM  She requests second opinion from another back specialist.  She describes fever, chills, sweats, and unexplained weight loss. Approximate 7.5# loss since 9/15.  The symptoms are associated with tingling in her legs and difficulty with balance or walking    Review of Systems At this time she denies chest pain, palpitations, or dyspnea  She also has no abdominal pain, melena, rectal bleeding, change in stools  There is no dysuria, pyuria, hematuria.  There's been no rash or change in color temperature of the skin in the area of pain.    Objective:   Physical Exam  Pertinent or positive findings include: She has  arcus senilis.  She has a grade 1 systolic murmur There is visible loss of height of the thoracic spine She has crepitus of the knees Deep tendon reflexes are 1/2+ at the knees. She is able to lie flat on exam table and sit up without help. Straight leg raising was negative bilaterally.   General appearance :adequately nourished; in no distress. Appears younger than stated age Eyes: No conjunctival inflammation or scleral icterus is present. Heart:  Normal rate and regular rhythm. S1 and S2 normal without gallop,click, rub or other extra  sounds   Lungs:Chest clear to auscultation; no wheezes, rhonchi,rales ,or rubs present.No increased work of breathing.  Abdomen: bowel sounds normal, soft and non-tender without masses, organomegaly or hernias noted.  No guarding or rebound. No flank tenderness to percussion. Vascular : all pulses equal ; no bruits present. Skin:Warm & dry.  Intact without suspicious lesions or rashes ; no tenting Lymphatic: No lymphadenopathy is noted about the head, neck, axilla     Assessment & Plan:  See Current Assessment & Plan in Problem List under specific Diagnosis

## 2014-04-24 NOTE — Assessment & Plan Note (Signed)
Non operative back consultation Refill of meds X 1 only

## 2014-04-24 NOTE — Progress Notes (Signed)
Pre visit review using our clinic review tool, if applicable. No additional management support is needed unless otherwise documented below in the visit note. 

## 2014-04-24 NOTE — Assessment & Plan Note (Signed)
Increase Ramipril dose

## 2014-04-28 ENCOUNTER — Other Ambulatory Visit: Payer: Self-pay | Admitting: Internal Medicine

## 2014-05-01 ENCOUNTER — Telehealth: Payer: Self-pay | Admitting: Internal Medicine

## 2014-05-01 NOTE — Telephone Encounter (Signed)
Can you please call patient in regards to referral.  Looks like referral was done.  But I'm not sure what office is to give patient a call back in regards.  Dr.  Linna Darner told patient to call back today.

## 2014-05-01 NOTE — Telephone Encounter (Signed)
Referral has been faxed to [facility]. The 2nd opinion coordinator will review the patient's records and they will schedule directly with the patient. I spoke w/the patient and she is aware.

## 2014-05-03 DIAGNOSIS — M5430 Sciatica, unspecified side: Secondary | ICD-10-CM | POA: Diagnosis not present

## 2014-05-04 ENCOUNTER — Telehealth: Payer: Self-pay | Admitting: Internal Medicine

## 2014-05-04 NOTE — Telephone Encounter (Signed)
Dr. Maia Petties is who Lavonne will continue to see, she wanted Dr Linna Darner to know.

## 2014-05-21 DIAGNOSIS — M4807 Spinal stenosis, lumbosacral region: Secondary | ICD-10-CM | POA: Diagnosis not present

## 2014-05-21 DIAGNOSIS — M5430 Sciatica, unspecified side: Secondary | ICD-10-CM | POA: Diagnosis not present

## 2014-05-21 DIAGNOSIS — M4316 Spondylolisthesis, lumbar region: Secondary | ICD-10-CM | POA: Diagnosis not present

## 2014-05-21 DIAGNOSIS — M5127 Other intervertebral disc displacement, lumbosacral region: Secondary | ICD-10-CM | POA: Diagnosis not present

## 2014-05-24 DIAGNOSIS — M545 Low back pain: Secondary | ICD-10-CM | POA: Diagnosis not present

## 2014-05-24 DIAGNOSIS — M4807 Spinal stenosis, lumbosacral region: Secondary | ICD-10-CM | POA: Diagnosis not present

## 2014-05-24 DIAGNOSIS — R262 Difficulty in walking, not elsewhere classified: Secondary | ICD-10-CM | POA: Diagnosis not present

## 2014-05-28 DIAGNOSIS — R262 Difficulty in walking, not elsewhere classified: Secondary | ICD-10-CM | POA: Diagnosis not present

## 2014-05-28 DIAGNOSIS — M4807 Spinal stenosis, lumbosacral region: Secondary | ICD-10-CM | POA: Diagnosis not present

## 2014-05-28 DIAGNOSIS — M545 Low back pain: Secondary | ICD-10-CM | POA: Diagnosis not present

## 2014-05-29 ENCOUNTER — Telehealth: Payer: Self-pay | Admitting: Internal Medicine

## 2014-05-29 DIAGNOSIS — M25562 Pain in left knee: Principal | ICD-10-CM

## 2014-05-29 DIAGNOSIS — M25561 Pain in right knee: Secondary | ICD-10-CM

## 2014-05-29 NOTE — Telephone Encounter (Signed)
Patient received knee braces for both knees. She is stating that they are too large. Asking if we can reorder

## 2014-05-29 NOTE — Telephone Encounter (Signed)
Spoke to pt. Told pt that we needed to know the name of the company that the braces came from in order to reorder them.  Pt is calling back with the name of the company.

## 2014-05-30 NOTE — Telephone Encounter (Signed)
Pt called in, I found the name and phone number of the supply compy [employer].  She wants to know what she needs to do about her knee braces.  She said that the physical therapist told her that the knee braces were to long and do not fit right.  What does she need to do to get new ones?

## 2014-05-31 DIAGNOSIS — M4807 Spinal stenosis, lumbosacral region: Secondary | ICD-10-CM | POA: Diagnosis not present

## 2014-05-31 DIAGNOSIS — R262 Difficulty in walking, not elsewhere classified: Secondary | ICD-10-CM | POA: Diagnosis not present

## 2014-05-31 DIAGNOSIS — M545 Low back pain: Secondary | ICD-10-CM | POA: Diagnosis not present

## 2014-05-31 NOTE — Telephone Encounter (Signed)
[employer] [facility] no longer has a working number. I will see if there is another way to order the knee brace.

## 2014-06-01 NOTE — Telephone Encounter (Signed)
LVM for pt to call back.  Reordered knee brace and faxed order to [facility].

## 2014-06-05 DIAGNOSIS — R262 Difficulty in walking, not elsewhere classified: Secondary | ICD-10-CM | POA: Diagnosis not present

## 2014-06-05 DIAGNOSIS — M4807 Spinal stenosis, lumbosacral region: Secondary | ICD-10-CM | POA: Diagnosis not present

## 2014-06-05 DIAGNOSIS — M545 Low back pain: Secondary | ICD-10-CM | POA: Diagnosis not present

## 2014-06-07 DIAGNOSIS — R262 Difficulty in walking, not elsewhere classified: Secondary | ICD-10-CM | POA: Diagnosis not present

## 2014-06-07 DIAGNOSIS — M545 Low back pain: Secondary | ICD-10-CM | POA: Diagnosis not present

## 2014-06-07 DIAGNOSIS — M4807 Spinal stenosis, lumbosacral region: Secondary | ICD-10-CM | POA: Diagnosis not present

## 2014-06-12 DIAGNOSIS — M4807 Spinal stenosis, lumbosacral region: Secondary | ICD-10-CM | POA: Diagnosis not present

## 2014-06-12 DIAGNOSIS — M545 Low back pain: Secondary | ICD-10-CM | POA: Diagnosis not present

## 2014-06-12 DIAGNOSIS — R262 Difficulty in walking, not elsewhere classified: Secondary | ICD-10-CM | POA: Diagnosis not present

## 2014-06-14 DIAGNOSIS — M4807 Spinal stenosis, lumbosacral region: Secondary | ICD-10-CM | POA: Diagnosis not present

## 2014-06-14 DIAGNOSIS — R262 Difficulty in walking, not elsewhere classified: Secondary | ICD-10-CM | POA: Diagnosis not present

## 2014-06-14 DIAGNOSIS — M545 Low back pain: Secondary | ICD-10-CM | POA: Diagnosis not present

## 2014-06-19 DIAGNOSIS — R262 Difficulty in walking, not elsewhere classified: Secondary | ICD-10-CM | POA: Diagnosis not present

## 2014-06-19 DIAGNOSIS — M545 Low back pain: Secondary | ICD-10-CM | POA: Diagnosis not present

## 2014-06-19 DIAGNOSIS — M4807 Spinal stenosis, lumbosacral region: Secondary | ICD-10-CM | POA: Diagnosis not present

## 2014-06-21 DIAGNOSIS — M545 Low back pain: Secondary | ICD-10-CM | POA: Diagnosis not present

## 2014-06-21 DIAGNOSIS — M4807 Spinal stenosis, lumbosacral region: Secondary | ICD-10-CM | POA: Diagnosis not present

## 2014-06-21 DIAGNOSIS — R262 Difficulty in walking, not elsewhere classified: Secondary | ICD-10-CM | POA: Diagnosis not present

## 2014-06-26 DIAGNOSIS — M545 Low back pain: Secondary | ICD-10-CM | POA: Diagnosis not present

## 2014-06-26 DIAGNOSIS — R262 Difficulty in walking, not elsewhere classified: Secondary | ICD-10-CM | POA: Diagnosis not present

## 2014-06-26 DIAGNOSIS — M4807 Spinal stenosis, lumbosacral region: Secondary | ICD-10-CM | POA: Diagnosis not present

## 2014-06-28 DIAGNOSIS — M545 Low back pain: Secondary | ICD-10-CM | POA: Diagnosis not present

## 2014-06-28 DIAGNOSIS — M4807 Spinal stenosis, lumbosacral region: Secondary | ICD-10-CM | POA: Diagnosis not present

## 2014-06-28 DIAGNOSIS — R262 Difficulty in walking, not elsewhere classified: Secondary | ICD-10-CM | POA: Diagnosis not present

## 2014-06-29 ENCOUNTER — Other Ambulatory Visit: Payer: Self-pay | Admitting: Internal Medicine

## 2014-07-03 DIAGNOSIS — M4807 Spinal stenosis, lumbosacral region: Secondary | ICD-10-CM | POA: Diagnosis not present

## 2014-07-03 DIAGNOSIS — R262 Difficulty in walking, not elsewhere classified: Secondary | ICD-10-CM | POA: Diagnosis not present

## 2014-07-03 DIAGNOSIS — M545 Low back pain: Secondary | ICD-10-CM | POA: Diagnosis not present

## 2014-07-05 DIAGNOSIS — M4807 Spinal stenosis, lumbosacral region: Secondary | ICD-10-CM | POA: Diagnosis not present

## 2014-07-05 DIAGNOSIS — M545 Low back pain: Secondary | ICD-10-CM | POA: Diagnosis not present

## 2014-07-05 DIAGNOSIS — R262 Difficulty in walking, not elsewhere classified: Secondary | ICD-10-CM | POA: Diagnosis not present

## 2014-07-09 ENCOUNTER — Ambulatory Visit (INDEPENDENT_AMBULATORY_CARE_PROVIDER_SITE_OTHER): Payer: Medicare Other | Admitting: Internal Medicine

## 2014-07-09 ENCOUNTER — Encounter: Payer: Self-pay | Admitting: Internal Medicine

## 2014-07-09 VITALS — BP 146/66 | HR 66 | Temp 97.9°F | Resp 16 | Ht 59.0 in | Wt 140.2 lb

## 2014-07-09 DIAGNOSIS — M5416 Radiculopathy, lumbar region: Secondary | ICD-10-CM

## 2014-07-09 NOTE — Progress Notes (Signed)
   Subjective:    Patient ID: [name], female    DOB: 05-Dec-1930, 81 y.o.   MRN: [id number]  HPI She's had a dramatic improvement in the right lumbosacral area pain. She received injections by Dr. Maia Petties with without any response. The pain was severe enough in this area to warrant emergency room visits by ambulance twice.   In the last week she has been pain-free; she gives credit to God for this miraculous situation.  She had been seeing a physical therapist with significant improvement in her symptoms.  She continues to have some intermittent radicular pain mainly in the right > the left lower extremity. This will radiate as far as the feet.   A mail order company had sent her back brace which did not fit. They're now trying to get her to get a brace for her knees & possibly for her upper extremities.  Review of Systems  She denies fever , chills, sweats, weight loss   She has no associated incontinence of urine or stool.    Objective:   Physical Exam Pertinent or positive findings include :  Right carotid bruit suggested.  An S4 is noted.  Straight leg raising is negative bilaterally. She is ambulating with a cane at this time but she states this is because she was carrying a heavy bag. Normally she does not use a cane.  General appearance :adequately nourished; in no distress.Appears younger than stated age Eyes: No conjunctival inflammation or scleral icterus is present. Heart:  Normal rate and regular rhythm. S1 and S2 normal without gallop, murmur, click,or rub . Lungs:Chest clear to auscultation; no wheezes, rhonchi,rales ,or rubs present.No increased work of breathing.  Abdomen: bowel sounds normal, soft and non-tender without masses, organomegaly or hernias noted.  No guarding or rebound. No flank tenderness to percussion. Vascular : all pulses equal Skin:Warm & dry.  Intact without suspicious lesions or rashes ; no tenting  Lymphatic: No lymphadenopathy is noted  about the head, neck, axilla Neuro: Strength, tone  normal.Decreased DTRS @ knees but equal        Assessment & Plan:   #1 lumbosacral radiculopathy ; dramatic improvement in the context of a strong spiritual faith and active physical therapy intervention.   Plan: the Physical Therapist will be consulted as to any equipment needs ; the mail-order service will not be employed.

## 2014-07-09 NOTE — Progress Notes (Signed)
Pre visit review using our clinic review tool, if applicable. No additional management support is needed unless otherwise documented below in the visit note. 

## 2014-07-09 NOTE — Patient Instructions (Addendum)
Please discuss any equipment needs with Gerald Stabs, your physical therapist. This will guarantee that you get the equipment that you need.

## 2014-07-18 ENCOUNTER — Ambulatory Visit (INDEPENDENT_AMBULATORY_CARE_PROVIDER_SITE_OTHER): Payer: Medicare Other | Admitting: Podiatry

## 2014-07-18 ENCOUNTER — Encounter: Payer: Self-pay | Admitting: Podiatry

## 2014-07-18 ENCOUNTER — Other Ambulatory Visit: Payer: Self-pay | Admitting: Internal Medicine

## 2014-07-18 VITALS — BP 169/75 | HR 68

## 2014-07-18 DIAGNOSIS — M79606 Pain in leg, unspecified: Secondary | ICD-10-CM | POA: Diagnosis not present

## 2014-07-18 DIAGNOSIS — B351 Tinea unguium: Secondary | ICD-10-CM

## 2014-07-18 NOTE — Progress Notes (Signed)
Subjective: 81 year old female presents complaining of pain on both feet with deformed toe nails.   Objective: Dermatologic: Deformed toe nails 2-3 bilateral. Vascular: All pedal pulses are palpable. Right DP is weaker than the left. Neuro: Normal response to Monofilament testing on both feet. Subjective numbness and tingling on left foot.  Orthopedic: Severe bunion bilateral. Contracted lesser digits bilateral.  Assessment: Hallux valgus with bilateral bunion deformity. Severe pronation STJ bilateral. Painful toe nails.   Plan: Reviewed findings and available options. Debrided all nails.

## 2014-07-18 NOTE — Patient Instructions (Signed)
Seen for hypertrophic nails. All nails debrided. Return in 3 months or as needed.  

## 2014-07-24 DIAGNOSIS — M4807 Spinal stenosis, lumbosacral region: Secondary | ICD-10-CM | POA: Diagnosis not present

## 2014-07-24 DIAGNOSIS — R262 Difficulty in walking, not elsewhere classified: Secondary | ICD-10-CM | POA: Diagnosis not present

## 2014-07-24 DIAGNOSIS — M545 Low back pain: Secondary | ICD-10-CM | POA: Diagnosis not present

## 2014-07-26 DIAGNOSIS — M4807 Spinal stenosis, lumbosacral region: Secondary | ICD-10-CM | POA: Diagnosis not present

## 2014-07-26 DIAGNOSIS — M545 Low back pain: Secondary | ICD-10-CM | POA: Diagnosis not present

## 2014-07-26 DIAGNOSIS — R262 Difficulty in walking, not elsewhere classified: Secondary | ICD-10-CM | POA: Diagnosis not present

## 2014-07-31 DIAGNOSIS — R262 Difficulty in walking, not elsewhere classified: Secondary | ICD-10-CM | POA: Diagnosis not present

## 2014-07-31 DIAGNOSIS — M4807 Spinal stenosis, lumbosacral region: Secondary | ICD-10-CM | POA: Diagnosis not present

## 2014-07-31 DIAGNOSIS — M545 Low back pain: Secondary | ICD-10-CM | POA: Diagnosis not present

## 2014-08-02 DIAGNOSIS — R262 Difficulty in walking, not elsewhere classified: Secondary | ICD-10-CM | POA: Diagnosis not present

## 2014-08-02 DIAGNOSIS — M545 Low back pain: Secondary | ICD-10-CM | POA: Diagnosis not present

## 2014-08-02 DIAGNOSIS — M4807 Spinal stenosis, lumbosacral region: Secondary | ICD-10-CM | POA: Diagnosis not present

## 2014-08-16 ENCOUNTER — Encounter: Payer: Self-pay | Admitting: Internal Medicine

## 2014-08-16 ENCOUNTER — Ambulatory Visit (INDEPENDENT_AMBULATORY_CARE_PROVIDER_SITE_OTHER): Payer: Medicare Other | Admitting: Internal Medicine

## 2014-08-16 VITALS — BP 140/80 | HR 64 | Temp 98.1°F | Ht 59.0 in | Wt 136.5 lb

## 2014-08-16 DIAGNOSIS — M48061 Spinal stenosis, lumbar region without neurogenic claudication: Secondary | ICD-10-CM

## 2014-08-16 DIAGNOSIS — M5416 Radiculopathy, lumbar region: Secondary | ICD-10-CM | POA: Diagnosis not present

## 2014-08-16 DIAGNOSIS — M4806 Spinal stenosis, lumbar region: Secondary | ICD-10-CM

## 2014-08-16 MED ORDER — FENTANYL 25 MCG/HR TD PT72: 25.0000 ug | MEDICATED_PATCH | TRANSDERMAL | Status: DC

## 2014-08-16 NOTE — Progress Notes (Signed)
Pre visit review using our clinic review tool, if applicable. No additional management support is needed unless otherwise documented below in the visit note. 

## 2014-08-16 NOTE — Progress Notes (Signed)
   Subjective:    Patient ID: [name], female    DOB: 03-04-1931, 81 y.o.   MRN: [id number]  HPI When last seen her back pain was dramatically better related to spiritual intervention and physical therapy. There has been an exacerbation in severity. It is now daily; it radiates from the lateral lumbosacral area and buttocks down the back of the legs as far as the soles. She states that the pain is so bad she is no longer is able to drive or go to church.  She previously had seen Dr. Maia Petties and had epidural steroid injections. She is unsure when this was done. She is questioning having laser surgery; she has a promotional leaflet from the [place].  Review of Systems   She has some recurrent urinary incontinence  She has some tingling in her legs  The pain has affected balance.     Objective:   Physical Exam Pertinent or positive findings include: There is splitting of the first heart sound. She has grade 0/0-9 systolic murmur. Pedal pulses are surprisingly good Reflexes are 1/2+ at the knees.  Gait is broad and slow.    General appearance :adequately nourished; in no distress.Appears younger than stated age Eyes: No conjunctival inflammation or scleral icterus is present. Heart:  Normal rate and regular rhythm.  S2 normal without gallop, click, rub or other extra sounds   Lungs:Chest clear to auscultation; no wheezes, rhonchi,rales ,or rubs present.No increased work of breathing.  Abdomen: bowel sounds normal, soft and non-tender without masses, organomegaly or hernias noted.  No guarding or rebound. No flank tenderness to percussion. Vascular : all pulses equal ; no bruits present. Skin:Warm & dry.  Intact without suspicious lesions or rashes ; no tenting  Lymphatic: No lymphadenopathy is noted about the head, neck, axilla Neuro: Strength, tone normal.      Assessment & Plan:  #1 lumbosacral radiculopathy, progressive  Plan: Referral to back specialist  when she decides whom she wants to see. Fentanyl patch will be prescribed until that assessment.

## 2014-08-16 NOTE — Patient Instructions (Signed)
The pain patch will be prescribed short-term, only until you can establish with the back specialist of your choice. Definitive diagnosis and therapy rather than simply treating symptoms is critical to protect you. The narcotic medications greatly increase your risk of falls and serious injury.

## 2014-08-29 ENCOUNTER — Ambulatory Visit: Payer: Medicare Other | Admitting: Internal Medicine

## 2014-09-11 DIAGNOSIS — M545 Low back pain: Secondary | ICD-10-CM | POA: Diagnosis not present

## 2014-09-11 DIAGNOSIS — M4807 Spinal stenosis, lumbosacral region: Secondary | ICD-10-CM | POA: Diagnosis not present

## 2014-09-11 DIAGNOSIS — R262 Difficulty in walking, not elsewhere classified: Secondary | ICD-10-CM | POA: Diagnosis not present

## 2014-09-24 ENCOUNTER — Encounter (HOSPITAL_COMMUNITY): Payer: Self-pay

## 2014-09-24 ENCOUNTER — Emergency Department (HOSPITAL_COMMUNITY): Payer: Medicare Other

## 2014-09-24 ENCOUNTER — Telehealth: Payer: Self-pay | Admitting: Internal Medicine

## 2014-09-24 ENCOUNTER — Emergency Department (HOSPITAL_COMMUNITY)
Admission: EM | Admit: 2014-09-24 | Discharge: 2014-09-24 | Disposition: A | Discharge status: Home / Self Care | Payer: Medicare Other | Attending: Emergency Medicine | Admitting: Emergency Medicine

## 2014-09-24 DIAGNOSIS — M19019 Primary osteoarthritis, unspecified shoulder: Secondary | ICD-10-CM | POA: Diagnosis not present

## 2014-09-24 DIAGNOSIS — K219 Gastro-esophageal reflux disease without esophagitis: Secondary | ICD-10-CM | POA: Diagnosis not present

## 2014-09-24 DIAGNOSIS — Z862 Personal history of diseases of the blood and blood-forming organs and certain disorders involving the immune mechanism: Secondary | ICD-10-CM | POA: Diagnosis not present

## 2014-09-24 DIAGNOSIS — I2510 Atherosclerotic heart disease of native coronary artery without angina pectoris: Secondary | ICD-10-CM | POA: Diagnosis not present

## 2014-09-24 DIAGNOSIS — Z87891 Personal history of nicotine dependence: Secondary | ICD-10-CM | POA: Diagnosis not present

## 2014-09-24 DIAGNOSIS — M545 Low back pain: Secondary | ICD-10-CM | POA: Insufficient documentation

## 2014-09-24 DIAGNOSIS — K5900 Constipation, unspecified: Secondary | ICD-10-CM | POA: Diagnosis not present

## 2014-09-24 DIAGNOSIS — I10 Essential (primary) hypertension: Secondary | ICD-10-CM | POA: Diagnosis not present

## 2014-09-24 DIAGNOSIS — M797 Fibromyalgia: Secondary | ICD-10-CM | POA: Diagnosis not present

## 2014-09-24 DIAGNOSIS — Z951 Presence of aortocoronary bypass graft: Secondary | ICD-10-CM | POA: Diagnosis not present

## 2014-09-24 DIAGNOSIS — Z7982 Long term (current) use of aspirin: Secondary | ICD-10-CM | POA: Diagnosis not present

## 2014-09-24 DIAGNOSIS — Z791 Long term (current) use of non-steroidal anti-inflammatories (NSAID): Secondary | ICD-10-CM | POA: Diagnosis not present

## 2014-09-24 DIAGNOSIS — Z79899 Other long term (current) drug therapy: Secondary | ICD-10-CM | POA: Diagnosis not present

## 2014-09-24 DIAGNOSIS — E785 Hyperlipidemia, unspecified: Secondary | ICD-10-CM | POA: Diagnosis not present

## 2014-09-24 DIAGNOSIS — E119 Type 2 diabetes mellitus without complications: Secondary | ICD-10-CM | POA: Insufficient documentation

## 2014-09-24 DIAGNOSIS — G4700 Insomnia, unspecified: Secondary | ICD-10-CM | POA: Diagnosis not present

## 2014-09-24 MED ORDER — POLYETHYLENE GLYCOL 3350 17 G PO PACK: 17.0000 g | PACK | Freq: Two times a day (BID) | ORAL | Status: DC

## 2014-09-24 MED ORDER — MAGNESIUM CITRATE PO SOLN: 1.0000 | Freq: Once | ORAL | Status: DC

## 2014-09-24 NOTE — Telephone Encounter (Signed)
[facility]  Patient Name: [name]  DOB: Feb 10, 1931    Initial Comment Caller states she has not had bowel movement in a week, uncomfortable and laxative/enemas not working, has spinal stenosis and back problems for months   Nurse Assessment  Nurse: Wynetta Emery, RN, Baker Janus Date/Time (Eastern Time): 09/24/2014 9:59:46 AM  Confirm and document reason for call. If symptomatic, describe symptoms. ---[name] states she has not had bowel movement in over week, uncomfortable and laxative/enemas not working, has spinal stenosis and back problems for 3 months and no relief for back pain nor the constipation  Has the patient traveled out of the country within the last 30 days? ---No  Does the patient require triage? ---Yes  Related visit to physician within the last 2 weeks? ---No  Does the PT have any chronic conditions? (i.e. diabetes, asthma, etc.) ---Yes  List chronic conditions. ---Spinal Stenosis Chronic Constipation     Guidelines    Guideline Title Affirmed Question Affirmed Notes  Constipation Last bowel movement (BM) > 4 days ago    Final Disposition User   See Physician within [place], RN, Baker Janus    Comments  Nurse tried to find an appt for today for her none available one tomorrow but she needs to be seen today advised her to go to ED to be evaluated and treated She thanked nurse for trying.

## 2014-09-24 NOTE — ED Provider Notes (Signed)
CSN: [id number]     Arrival date & time 09/24/14  1159 History   First MD Initiated Contact with Patient 09/24/14 1731     Chief Complaint  Patient presents with  . Constipation     (Consider location/radiation/quality/duration/timing/severity/associated sxs/prior Treatment) Patient is a 81 y.o. female presenting with constipation.  Constipation Associated symptoms: back pain   Associated symptoms: no abdominal pain, no diarrhea, no nausea and no vomiting    patient presents with constipation. States she has not had bowel movement the last 7 days. This is somewhat unusual for her. States she's been eating a little less. She had some mild abdominal cramping but no severe pain. No nausea vomiting. No fevers. She has had some laxity of his enemas and magnesium citrate without significant results. She states that she has had some liquid stool. No fevers. She does have some right flank pain that goes down to her hip area. She has a history of spinal stenosis and is due for surgery.   Past Medical History  Diagnosis Date  . S/P CABG x 1   . S/P inguinal hernia repair   . 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 CAD, NATIVE VESSEL 06/09/2008  . UNSPECIFIED ANEMIA   . DIABETES MELLITUS, TYPE II   . GERD   . HYPERLIPIDEMIA   . HYPERTENSION   . HIATAL HERNIA   . FIBROMYALGIA   . OA (osteoarthritis)     severe, shoulders, hands - inflammatory, ?RA  . Gout   . Insomnia    Past Surgical History  Procedure Laterality Date  . Hernia repair    . Abdominal hysterectomy  1981  . Coronary artery bypass graft  2010  . Appendectomy    . Replacement total knee bilateral Bilateral     Rt-1974, Lt -1989  . Rotator cuff repair      Left  . Ovarian cyst removal    . Cataract extraction, bilateral    . Reverse shoulder arthroplasty  09/08/2011    Procedure: REVERSE SHOULDER ARTHROPLASTY;  Surgeon: Nita Sells, MD;   Location: [facility];  Service: Orthopedics;  Laterality: Left;  left reverse total shoulder arthroplasty  . Joint replacement     Family History  Problem Relation Age of Onset  . Cancer Mother     Stomach  . Stomach cancer Mother 58  . Hypertension Mother   . Heart disease Father 50    MI  . Diabetes Father   . Heart attack Father   . Diabetes Sister   . Heart disease Sister   . Heart attack Brother   . Cancer Brother   . Diabetes Brother   . Anesthesia problems Neg Hx   . Colon cancer Neg Hx   . Diabetes Daughter   . Hypertension Daughter    History  Substance Use Topics  . Smoking status: Former Smoker    Quit date: 04/26/1970  . Smokeless tobacco: Never Used  . Alcohol Use: 0.6 oz/week    1 Glasses of wine per week   OB History    No data available     Review of Systems  Constitutional: Negative for activity change and appetite change.  Eyes: Negative for pain.  Respiratory: Negative for chest tightness and shortness of breath.   Cardiovascular: Negative for chest pain and leg swelling.  Gastrointestinal: Positive for constipation. Negative for nausea, vomiting, abdominal pain and diarrhea.  Genitourinary: Negative  for flank pain.  Musculoskeletal: Positive for back pain. Negative for neck stiffness.  Skin: Negative for rash.  Neurological: Negative for weakness, numbness and headaches.  Psychiatric/Behavioral: Negative for behavioral problems.      Allergies  Review of patient's allergies indicates no known allergies.  Home Medications   Prior to Admission medications   Medication Sig Start Date End Date Taking? Authorizing Provider  amLODipine (NORVASC) 5 MG tablet TAKE 1 TABLET DAILY 01/31/14  Yes Rowe Clack, MD  aspirin EC 81 MG tablet Take 81 mg by mouth daily.   Yes Historical Provider, MD  atorvastatin (LIPITOR) 40 MG tablet Take 40 mg by mouth at bedtime.   Yes Historical Provider, MD  carvedilol (COREG) 6.25 MG tablet Take 6.25 mg by mouth 2  (two) times daily with a meal.  01/12/14  Yes Hendricks Limes, MD  cetirizine (ZYRTEC) 10 MG tablet TAKE 1 TABLET (10 MG TOTAL) BY MOUTH DAILY. 04/30/14  Yes Rowe Clack, MD  Cyanocobalamin (VITAMIN B-12 PO) Take 1 tablet by mouth daily with breakfast.    Yes Historical Provider, MD  DULoxetine (CYMBALTA) 30 MG capsule Take 1 capsule (30 mg total) by mouth daily. 04/24/14  Yes Hendricks Limes, MD  gabapentin (NEURONTIN) 100 MG capsule 2 pills every 8 hrs as needed 04/24/14  Yes Hendricks Limes, MD  meloxicam (MOBIC) 7.5 MG tablet Take 1 tablet (7.5 mg total) by mouth daily. 06/29/14  Yes Rowe Clack, MD  metFORMIN (GLUCOPHAGE-XR) 500 MG 24 hr tablet TAKE 1 TABLET DAILY WITH   BREAKFAST 07/19/14  Yes Rowe Clack, MD  omeprazole (PRILOSEC) 40 MG capsule TAKE 1 CAPSULE (40 MG TOTAL) BY MOUTH DAILY. 02/12/14  Yes Rowe Clack, MD  OVER THE COUNTER MEDICATION Take 2 tablets by mouth daily as needed. "Dietary Supplement"   Yes Historical Provider, MD  ramipril (ALTACE) 5 MG capsule 2 daily 04/24/14  Yes Hendricks Limes, MD  senna (SENOKOT) 8.6 MG TABS Take 2 tablets by mouth as needed. For constipation   Yes Historical Provider, MD  VITAMIN E PO Take 1 capsule by mouth daily with breakfast.    Yes Historical Provider, MD  magnesium citrate SOLN Take 296 mLs (1 Bottle total) by mouth once. 09/26/14   Davonna Belling, MD  polyethylene glycol [facility] / Floria Raveling) packet Take 17 g by mouth 2 (two) times daily. 09/24/14   Davonna Belling, MD   BP 163/67 mmHg  Pulse 59  Temp(Src) 97.7 F (36.5 C) (Oral)  Resp 18  Ht 4\' 11"  (1.499 m)  Wt 137 lb (62.143 kg)  BMI 27.66 kg/m2  SpO2 98% Physical Exam  Constitutional: She appears well-developed.  HENT:  Head: Normocephalic.  Cardiovascular: Normal rate and regular rhythm.   Pulmonary/Chest: Effort normal.  Abdominal: She exhibits no distension and no mass. There is no tenderness. There is no rebound.  Genitourinary:  No stool in the  rectal vault.  Neurological: She is alert.  Skin: Skin is warm.    ED Course  Procedures (including critical care time) Labs Review Labs Reviewed - No data to display  Imaging Review Dg Abd 1 View  09/24/2014   CLINICAL DATA:  Constipation.  EXAM: ABDOMEN - 1 VIEW  COMPARISON:  None.  FINDINGS: Moderate stool volume, with stool predominantly distributed between the hepatic and splenic flexures. No evidence of bowel obstruction. No concerning intra-abdominal mass effect. Diffuse atherosclerotic calcification.  IMPRESSION: 1. Nonobstructive bowel gas pattern. 2. Moderate stool volume.  No  rectal impaction.   Electronically Signed   By: Monte Fantasia M.D.   On: 09/24/2014 13:33     EKG Interpretation None      MDM   Final diagnoses:  Constipation, unspecified constipation type    Patient with constipation. No stool in the vault. Benign abdominal exam. Will discharge home with a bowel protocol. Back pain is likely not do to the constipation is likely just a worsening of her spinal stenosis.    Davonna Belling, MD 09/24/14 [phone]

## 2014-09-24 NOTE — Discharge Instructions (Signed)

## 2014-09-24 NOTE — ED Notes (Signed)
Pt has had back surgery years ago and has had back pain and constipation the past 7 days. The back pain has gotten worse due to not being able to have a BM. Usually takes a laxative daily. Has tried magnesium citrate, then tried suppositories. Still has not had a BM. Has also tried 2 enemas.

## 2014-09-27 DIAGNOSIS — M4722 Other spondylosis with radiculopathy, cervical region: Secondary | ICD-10-CM | POA: Diagnosis not present

## 2014-09-27 DIAGNOSIS — M545 Low back pain: Secondary | ICD-10-CM | POA: Diagnosis not present

## 2014-10-01 ENCOUNTER — Other Ambulatory Visit: Payer: Self-pay | Admitting: Orthopedic Surgery

## 2014-10-01 DIAGNOSIS — M545 Low back pain: Secondary | ICD-10-CM

## 2014-10-13 ENCOUNTER — Other Ambulatory Visit: Payer: Self-pay | Admitting: Orthopedic Surgery

## 2014-10-13 ENCOUNTER — Ambulatory Visit
Admission: RE | Admit: 2014-10-13 | Discharge: 2014-10-13 | Disposition: A | Discharge status: Home / Self Care | Payer: Medicare Other | Source: Ambulatory Visit | Attending: Orthopedic Surgery | Admitting: Orthopedic Surgery

## 2014-10-13 DIAGNOSIS — M4317 Spondylolisthesis, lumbosacral region: Secondary | ICD-10-CM | POA: Diagnosis not present

## 2014-10-13 DIAGNOSIS — M5137 Other intervertebral disc degeneration, lumbosacral region: Secondary | ICD-10-CM | POA: Diagnosis not present

## 2014-10-13 DIAGNOSIS — M47817 Spondylosis without myelopathy or radiculopathy, lumbosacral region: Secondary | ICD-10-CM | POA: Diagnosis not present

## 2014-10-13 DIAGNOSIS — M545 Low back pain: Secondary | ICD-10-CM

## 2014-10-13 MED ORDER — GADOBENATE DIMEGLUMINE 529 MG/ML IV SOLN: 12.0000 mL | Freq: Once | INTRAVENOUS | Status: AC | PRN

## 2014-10-15 ENCOUNTER — Other Ambulatory Visit: Payer: Self-pay

## 2014-10-15 DIAGNOSIS — M5416 Radiculopathy, lumbar region: Secondary | ICD-10-CM | POA: Diagnosis not present

## 2014-10-17 ENCOUNTER — Other Ambulatory Visit: Payer: Self-pay | Admitting: Orthopedic Surgery

## 2014-10-17 DIAGNOSIS — M545 Low back pain, unspecified: Secondary | ICD-10-CM

## 2014-10-17 DIAGNOSIS — G8929 Other chronic pain: Secondary | ICD-10-CM

## 2014-10-18 ENCOUNTER — Other Ambulatory Visit: Payer: Medicare Other

## 2014-10-19 ENCOUNTER — Ambulatory Visit
Admission: RE | Admit: 2014-10-19 | Discharge: 2014-10-19 | Disposition: A | Discharge status: Home / Self Care | Payer: Medicare Other | Source: Ambulatory Visit | Attending: Orthopedic Surgery | Admitting: Orthopedic Surgery

## 2014-10-19 ENCOUNTER — Ambulatory Visit: Payer: Medicare Other | Admitting: Podiatry

## 2014-10-19 DIAGNOSIS — M545 Low back pain, unspecified: Secondary | ICD-10-CM

## 2014-10-19 DIAGNOSIS — M47816 Spondylosis without myelopathy or radiculopathy, lumbar region: Secondary | ICD-10-CM | POA: Diagnosis not present

## 2014-10-19 DIAGNOSIS — M4317 Spondylolisthesis, lumbosacral region: Secondary | ICD-10-CM | POA: Diagnosis not present

## 2014-10-19 DIAGNOSIS — M5126 Other intervertebral disc displacement, lumbar region: Secondary | ICD-10-CM | POA: Diagnosis not present

## 2014-10-19 DIAGNOSIS — G8929 Other chronic pain: Secondary | ICD-10-CM

## 2014-10-19 MED ORDER — IOHEXOL 180 MG/ML  SOLN
1.0000 mL | Freq: Once | INTRAMUSCULAR | Status: AC | PRN
Start: 2014-10-19 — End: 2014-10-19
  Administered 2014-10-19: 1 mL via EPIDURAL

## 2014-10-19 MED ORDER — METHYLPREDNISOLONE ACETATE 40 MG/ML INJ SUSP (RADIOLOG
120.0000 mg | Freq: Once | INTRAMUSCULAR | Status: AC
  Administered 2014-10-19: 120 mg via EPIDURAL

## 2014-10-19 NOTE — Discharge Instructions (Signed)

## 2014-11-07 ENCOUNTER — Other Ambulatory Visit: Payer: Self-pay | Admitting: Internal Medicine

## 2014-11-09 DIAGNOSIS — M5416 Radiculopathy, lumbar region: Secondary | ICD-10-CM | POA: Diagnosis not present

## 2014-11-12 DIAGNOSIS — E1339 Other specified diabetes mellitus with other diabetic ophthalmic complication: Secondary | ICD-10-CM | POA: Diagnosis not present

## 2014-11-12 DIAGNOSIS — H524 Presbyopia: Secondary | ICD-10-CM | POA: Diagnosis not present

## 2014-11-12 DIAGNOSIS — H35023 Exudative retinopathy, bilateral: Secondary | ICD-10-CM | POA: Diagnosis not present

## 2014-11-16 ENCOUNTER — Ambulatory Visit: Payer: Medicare Other | Admitting: Podiatry

## 2014-11-18 ENCOUNTER — Other Ambulatory Visit: Payer: Self-pay | Admitting: Internal Medicine

## 2014-11-19 LAB — HM DIABETES EYE EXAM

## 2014-11-23 ENCOUNTER — Ambulatory Visit: Payer: Medicare Other | Admitting: Internal Medicine

## 2014-11-23 DIAGNOSIS — R4689 Other symptoms and signs involving appearance and behavior: Secondary | ICD-10-CM | POA: Insufficient documentation

## 2014-11-26 ENCOUNTER — Other Ambulatory Visit: Payer: Self-pay | Admitting: Internal Medicine

## 2014-11-26 ENCOUNTER — Ambulatory Visit (INDEPENDENT_AMBULATORY_CARE_PROVIDER_SITE_OTHER): Payer: Medicare Other | Admitting: Internal Medicine

## 2014-11-26 ENCOUNTER — Other Ambulatory Visit (INDEPENDENT_AMBULATORY_CARE_PROVIDER_SITE_OTHER): Payer: Medicare Other

## 2014-11-26 ENCOUNTER — Encounter: Payer: Self-pay | Admitting: Internal Medicine

## 2014-11-26 VITALS — BP 150/64 | HR 57 | Temp 98.4°F | Resp 16 | Wt 146.0 lb

## 2014-11-26 DIAGNOSIS — E119 Type 2 diabetes mellitus without complications: Secondary | ICD-10-CM

## 2014-11-26 DIAGNOSIS — E785 Hyperlipidemia, unspecified: Secondary | ICD-10-CM

## 2014-11-26 DIAGNOSIS — M5431 Sciatica, right side: Secondary | ICD-10-CM

## 2014-11-26 LAB — HEPATIC FUNCTION PANEL
ALT: 14 U/L (ref 0–35)
AST: 23 U/L (ref 0–37)
Albumin: 4.2 g/dL (ref 3.5–5.2)
Alkaline Phosphatase: 62 U/L (ref 39–117)
Bilirubin, Direct: 0.1 mg/dL (ref 0.0–0.3)
Total Bilirubin: 0.4 mg/dL (ref 0.2–1.2)
Total Protein: 7.2 g/dL (ref 6.0–8.3)

## 2014-11-26 LAB — HEMOGLOBIN A1C: Hgb A1c MFr Bld: 5.7 % (ref 4.6–6.5)

## 2014-11-26 LAB — LIPID PANEL
Cholesterol: 239 mg/dL — ABNORMAL HIGH (ref 0–200)
HDL: 107.3 mg/dL (ref >39.00)
LDL Cholesterol: 114 mg/dL — ABNORMAL HIGH (ref 0–99)
NonHDL: 131.21
Total CHOL/HDL Ratio: 2
Triglycerides: 85 mg/dL (ref 0.0–149.0)
VLDL: 17 mg/dL (ref 0.0–40.0)

## 2014-11-26 NOTE — Patient Instructions (Addendum)
  Your next office appointment will be determined based upon review of your pending labs  and  xrays  Those written interpretation of the lab results and instructions will be transmitted to you by My Chart   Critical results will be called.   Followup as needed for any active or acute issue. Please report any significant change in your symptoms. 

## 2014-11-26 NOTE — Progress Notes (Signed)
   Subjective:    Patient ID: [name], female    DOB: 11/06/1930, 81 y.o.   MRN: [id number]  HPI  Her radiculopathy and sciatica are dramatically better after an injection by one of Dr. Mayme Genta associates. She continues to have intermittent tingling in her legs and feet but these symptoms are also dramatically better. She also has occasional incontinence and also urgency.  Installing a walk-in tub addended May has been of significant therapeutic benefit. She also wears special footwear.  She continues to have some tingling in her legs and feet but this is improved dramatically.   Fasting blood sugars average less than 150. The last A1c on record was 5.9% in 11/ 2015.  She denies any active cardiopulmonary symptoms. The last lipids on record were 01/05/14. LDL was 96 and HDL 84.1.  Review of Systems Fever, chills, sweats, or unexplained weight loss not present. No significant headaches. Mental status changes denied.  Memory loss is a chronic issue. Blurred vision , diplopia or vision loss absent. Vertigo, near syncope or imbalance denied. No seizure stigmata. Chest pain, palpitations, tachycardia, exertional dyspnea, paroxysmal nocturnal dyspnea, claudication or edema are absent.      Objective:   Physical Exam  Pertinent or positive findings include: Her gait is slow, slightly broad, and deliberate. She has mixed changes of arthritis in her hands, this is more pronounced on the right. She has flexion contracture of the right fifth digit.  General appearance :adequately nourished; in no distress.  Eyes: No conjunctival inflammation or scleral icterus is present.   Heart:  Normal rate and regular rhythm. S1 and S2 normal without gallop, murmur, click, rub or other extra sounds    Lungs:Chest clear to auscultation; no wheezes, rhonchi,rales ,or rubs present.No increased work of breathing.   Abdomen: bowel sounds normal, soft and non-tender without masses, organomegaly  or hernias noted.  No guarding or rebound.   Vascular : all pulses equal ; no bruits present.  Skin:Warm & dry.  Intact without suspicious lesions or rashes ; no tenting  Lymphatic: No lymphadenopathy is noted about the head, neck, axilla.   Neuro: Strength, tone  decreased         Assessment & Plan:  #1 radiculopathy, dramatically improved with epidural steroidal and walk in tub  #2 hyperglycemia with nondiabetic A1c  #3 dyslipidemia with excellent values  Plan: See orders

## 2014-11-26 NOTE — Progress Notes (Signed)
Pre visit review using our clinic review tool, if applicable. No additional management support is needed unless otherwise documented below in the visit note. 

## 2014-11-27 ENCOUNTER — Telehealth: Payer: Self-pay | Admitting: Internal Medicine

## 2014-11-27 NOTE — Telephone Encounter (Signed)
Spoke with pt, she stated that the pain is going down into the back of her leg into her calf and into her heel. She wanted to make sure that Dr Linna Darner knew because during her appt she told him that she was not having that pain.

## 2014-11-27 NOTE — Telephone Encounter (Signed)
Patient stated that she didn't answer your question correctly, about her pain.  She is having pain in her hip down the side of her legs and in her heel.

## 2014-11-28 ENCOUNTER — Other Ambulatory Visit: Payer: Self-pay | Admitting: Internal Medicine

## 2014-11-28 MED ORDER — CARVEDILOL 6.25 MG PO TABS: ORAL_TABLET | ORAL | Status: DC

## 2014-11-28 NOTE — Telephone Encounter (Signed)
Pt states she is out of her BP medication the generic coreg. Mail order stated they have contacted md office. Inform pt will send script back to [facility] it was sent to them on 11/08/14. Also will send a 30 day supply to [facility] until she receive mail order...Johny Chess

## 2014-12-03 ENCOUNTER — Encounter: Payer: Self-pay | Admitting: Family

## 2014-12-03 ENCOUNTER — Ambulatory Visit (INDEPENDENT_AMBULATORY_CARE_PROVIDER_SITE_OTHER): Payer: Medicare Other | Admitting: Family

## 2014-12-03 VITALS — BP 138/68 | HR 62 | Temp 98.3°F | Resp 18 | Ht 59.0 in | Wt 145.8 lb

## 2014-12-03 DIAGNOSIS — J209 Acute bronchitis, unspecified: Secondary | ICD-10-CM | POA: Diagnosis not present

## 2014-12-03 MED ORDER — HYDROCODONE-HOMATROPINE 5-1.5 MG/5ML PO SYRP: 5.0000 mL | ORAL_SOLUTION | Freq: Three times a day (TID) | ORAL | Status: DC | PRN

## 2014-12-03 MED ORDER — AZITHROMYCIN 250 MG PO TABS: ORAL_TABLET | ORAL | Status: DC

## 2014-12-03 NOTE — Patient Instructions (Signed)
Thank you for choosing [facility].  Summary/Instructions:  Your prescription(s) have been submitted to your pharmacy or been printed and provided for you. Please take as directed and contact our office if you believe you are having problem(s) with the medication(s) or have any questions.  If your symptoms worsen or fail to improve, please contact our office for further instruction, or in case of emergency go directly to the emergency room at the closest medical facility.    Acute Bronchitis Bronchitis is inflammation of the airways that extend from the windpipe into the lungs (bronchi). The inflammation often causes mucus to develop. This leads to a cough, which is the most common symptom of bronchitis.  In acute bronchitis, the condition usually develops suddenly and goes away over time, usually in a couple weeks. Smoking, allergies, and asthma can make bronchitis worse. Repeated episodes of bronchitis may cause further lung problems.  CAUSES Acute bronchitis is most often caused by the same virus that causes a cold. The virus can spread from person to person (contagious) through coughing, sneezing, and touching contaminated objects. SIGNS AND SYMPTOMS   Cough.   Fever.   Coughing up mucus.   Body aches.   Chest congestion.   Chills.   Shortness of breath.   Sore throat.  DIAGNOSIS  Acute bronchitis is usually diagnosed through a physical exam. Your health care provider will also ask you questions about your medical history. Tests, such as chest X-rays, are sometimes done to rule out other conditions.  TREATMENT  Acute bronchitis usually goes away in a couple weeks. Oftentimes, no medical treatment is necessary. Medicines are sometimes given for relief of fever or cough. Antibiotic medicines are usually not needed but may be prescribed in certain situations. In some cases, an inhaler may be recommended to help reduce shortness of breath and control the cough. A cool  mist vaporizer may also be used to help thin bronchial secretions and make it easier to clear the chest.  HOME CARE INSTRUCTIONS  Get plenty of rest.   Drink enough fluids to keep your urine clear or pale yellow (unless you have a medical condition that requires fluid restriction). Increasing fluids may help thin your respiratory secretions (sputum) and reduce chest congestion, and it will prevent dehydration.   Take medicines only as directed by your health care provider.  If you were prescribed an antibiotic medicine, finish it all even if you start to feel better.  Avoid smoking and secondhand smoke. Exposure to cigarette smoke or irritating chemicals will make bronchitis worse. If you are a smoker, consider using nicotine gum or skin patches to help control withdrawal symptoms. Quitting smoking will help your lungs heal faster.   Reduce the chances of another bout of acute bronchitis by washing your hands frequently, avoiding people with cold symptoms, and trying not to touch your hands to your mouth, nose, or eyes.   Keep all follow-up visits as directed by your health care provider.  SEEK MEDICAL CARE IF: Your symptoms do not improve after 1 week of treatment.  SEEK IMMEDIATE MEDICAL CARE IF:  You develop an increased fever or chills.   You have chest pain.   You have severe shortness of breath.  You have bloody sputum.   You develop dehydration.  You faint or repeatedly feel like you are going to pass out.  You develop repeated vomiting.  You develop a severe headache. MAKE SURE YOU:   Understand these instructions.  Will watch your condition.  Will   get help right away if you are not doing well or get worse. Document Released: 05/14/2004 Document Revised: 08/21/2013 Document Reviewed: 09/27/2012 Exit[employer] Patient Information 2015 [employer]. This information is not intended to replace advice given to you by your health care provider. Make sure you discuss  any questions you have with your health care provider.  

## 2014-12-03 NOTE — Assessment & Plan Note (Signed)
Symptoms and exam consistent with acute bronchitis. Start azithromycin. Start Hycodan as needed for cough and sleep. Sample of Anoro given to assist with wheezing and breathing. Continue over-the-counter medications as needed for symptom relief and supportive care. Follow up if symptoms worsen or fail to improve.

## 2014-12-03 NOTE — Progress Notes (Signed)
Subjective:    Patient ID: [name], female    DOB: 04/08/1931, 81 y.o.   MRN: [id number]  Chief Complaint  Patient presents with  . Cough    started with a sore throat that has moved down to her chest and now has a productive cough, has been going on 5 days    HPI:  [name] is a 81 y.o. female with a PMH of anemia, arthritis, diabetes, GERD, hyperlipidemia, hypertension, and gout who presents today for an acute office visit.   This is a new problem. Associated symptoms of sore throat and productive cough have been going on for 5 days. Started with a sore throat. Modifying factors include a throat spray which did help. Timing of the symptoms is fairly consistent throughout the day.   No Known Allergies  Current Outpatient Prescriptions on File Prior to Visit  Medication Sig Dispense Refill  . amLODipine (NORVASC) 5 MG tablet TAKE 1 TABLET DAILY 90 tablet 3  . aspirin EC 81 MG tablet Take 81 mg by mouth daily.    Marland Kitchen atorvastatin (LIPITOR) 40 MG tablet Take 40 mg by mouth at bedtime.    . carvedilol (COREG) 6.25 MG tablet TAKE 1 TABLET TWICE A DAY  WITH MEALS 60 tablet 0  . cetirizine (ZYRTEC) 10 MG tablet TAKE 1 TABLET (10 MG TOTAL) BY MOUTH DAILY. 30 tablet 11  . Cyanocobalamin (VITAMIN B-12 PO) Take 1 tablet by mouth daily with breakfast.     . DULoxetine (CYMBALTA) 30 MG capsule Take 1 capsule (30 mg total) by mouth daily. 30 capsule 3  . gabapentin (NEURONTIN) 100 MG capsule 2 pills every 8 hrs as needed 180 capsule 0  . magnesium citrate SOLN Take 296 mLs (1 Bottle total) by mouth once. 195 mL 0  . meloxicam (MOBIC) 7.5 MG tablet Take 1 tablet (7.5 mg total) by mouth daily. 90 tablet 1  . omeprazole (PRILOSEC) 40 MG capsule Take 1 capsule (40 mg total) by mouth daily. 90 capsule 1  . OVER THE COUNTER MEDICATION Take 2 tablets by mouth daily as needed. "Dietary Supplement"    . polyethylene glycol (MIRALAX / GLYCOLAX) packet Take 17 g by mouth 2 (two) times daily.  14 each 0  . ramipril (ALTACE) 5 MG capsule 2 daily 60 capsule 3  . senna (SENOKOT) 8.6 MG TABS Take 2 tablets by mouth as needed. For constipation    . VITAMIN E PO Take 1 capsule by mouth daily with breakfast.      No current facility-administered medications on file prior to visit.      Review of Systems  Constitutional: Negative for fever and chills.  HENT: Positive for congestion and sore throat. Negative for sinus pressure, sneezing, trouble swallowing and voice change.   Respiratory: Positive for cough. Negative for chest tightness and shortness of breath.   Neurological: Negative for headaches.      Objective:    BP 138/68 mmHg  Pulse 62  Temp(Src) 98.3 F (36.8 C) (Oral)  Resp 18  Ht 4\' 11"  (1.499 m)  Wt 145 lb 12.8 oz (66.134 kg)  BMI 29.43 kg/m2  SpO2 96% Nursing note and vital signs reviewed.  Physical Exam  Constitutional: She is oriented to person, place, and time. She appears well-developed and well-nourished. No distress.  HENT:  Right Ear: Hearing, tympanic membrane, external ear and ear canal normal.  Left Ear: Hearing, tympanic membrane, external ear and ear canal normal.  Nose: Nose normal.  Mouth/Throat: Uvula is midline, oropharynx is clear and moist and mucous membranes are normal.  Cardiovascular: Normal rate, regular rhythm, normal heart sounds and intact distal pulses.   Pulmonary/Chest: Effort normal. She has wheezes.  Neurological: She is alert and oriented to person, place, and time.  Skin: Skin is warm and dry.  Psychiatric: She has a normal mood and affect. Her behavior is normal. Judgment and thought content normal.       Assessment & Plan:   Problem List Items Addressed This Visit      Respiratory   Acute bronchitis - Primary    Symptoms and exam consistent with acute bronchitis. Start azithromycin. Start Hycodan as needed for cough and sleep. Sample of Anoro given to assist with wheezing and breathing. Continue over-the-counter  medications as needed for symptom relief and supportive care. Follow up if symptoms worsen or fail to improve.      Relevant Medications   azithromycin (ZITHROMAX) 250 MG tablet   HYDROcodone-homatropine (HYCODAN) 5-1.5 MG/5ML syrup

## 2014-12-06 ENCOUNTER — Other Ambulatory Visit: Payer: Self-pay

## 2014-12-06 DIAGNOSIS — I10 Essential (primary) hypertension: Secondary | ICD-10-CM

## 2014-12-06 MED ORDER — AMLODIPINE BESYLATE 5 MG PO TABS: 5.0000 mg | ORAL_TABLET | Freq: Every day | ORAL | Status: DC

## 2014-12-06 MED ORDER — RAMIPRIL 5 MG PO CAPS: 10.0000 mg | ORAL_CAPSULE | Freq: Every day | ORAL | Status: DC

## 2014-12-13 ENCOUNTER — Ambulatory Visit (INDEPENDENT_AMBULATORY_CARE_PROVIDER_SITE_OTHER): Payer: Medicare Other | Admitting: Internal Medicine

## 2014-12-13 ENCOUNTER — Encounter: Payer: Self-pay | Admitting: Internal Medicine

## 2014-12-13 VITALS — BP 148/70 | HR 65 | Temp 98.5°F | Resp 16 | Wt 144.0 lb

## 2014-12-13 DIAGNOSIS — R062 Wheezing: Secondary | ICD-10-CM

## 2014-12-13 DIAGNOSIS — R059 Cough, unspecified: Secondary | ICD-10-CM

## 2014-12-13 DIAGNOSIS — R05 Cough: Secondary | ICD-10-CM

## 2014-12-13 MED ORDER — PREDNISONE 10 MG PO TABS: ORAL_TABLET | ORAL | Status: DC

## 2014-12-13 MED ORDER — MONTELUKAST SODIUM 10 MG PO TABS: 10.0000 mg | ORAL_TABLET | Freq: Every day | ORAL | Status: DC

## 2014-12-13 NOTE — Progress Notes (Signed)
   Subjective:    Patient ID: [name], female    DOB: 05/05/1930, 81 y.o.   MRN: [id number]  HPI  She continues to have a cough productive of clear sputum associated with some wheezing & shortness of breath. She has completed her course of azithromycin. She has some cough syrup left. The sore throat has resolved. She has no active upper respiratory tract infection symptoms.  No PMH of asthma ; but she had seasonal respiratory symptoms when living in [place] state.    Review of Systems Frontal headache, facial pain , nasal purulence, dental pain, sore throat , otic pain or otic discharge denied. No fever , chills or sweats.  Her radiculopathy symptoms have stabilized and even improved since she had walk in tub installed. She does not want to pursue "injections" as her back specialist recommended.  She fell recently striking her left breast. She has had intermittent soreness since. She had tried to obtain a mammogram ; but referral required.     Objective:   Physical Exam  General appearance:Adequately nourished; no acute distress or increased work of breathing is present.    Lymphatic: No  lymphadenopathy about the head, neck, or axilla .  Eyes: No conjunctival inflammation or lid edema is present. There is no scleral icterus.  Ears:  External ear exam shows no significant lesions or deformities.  Otoscopic examination reveals clear canals, tympanic membranes are intact bilaterally without bulging, retraction, inflammation or discharge.  Nose:  External nasal examination shows no deformity or inflammation. Nasal mucosa are pink and moist without lesions or exudates No septal dislocation or deviation.No obstruction to airflow.   Oral exam: Dental hygiene is good; lips and gums are healthy appearing.There is no oropharyngeal erythema or exudate .  Neck:  No deformities, thyromegaly, masses, or tenderness noted.   Supple with full range of motion without pain.   Heart:   Normal rate and regular rhythm. S1 and S2 accentuated without gallop, murmur, click, rub or other extra sounds.   Lungs: Minor rales at the bases without increased work of breathing.  Extremities:  No cyanosis, edema, or clubbing  noted    Skin: Warm & dry w/o tenting or jaundice. No significant lesions or rash.       Assessment & Plan:  #1 acute bronchitis, status post course of azithromycin. Residual cough, shortness of breath, wheezing.  #2 lumbar radiculopathy stable to improved  #3 post traumatic breast soreness. Mammogram up to date (03/20/14; Bi-Rads Category 1)); but mammogram will be ordered if symptoms fail to resolve.  See orders

## 2014-12-13 NOTE — Progress Notes (Signed)
Pre visit review using our clinic review tool, if applicable. No additional management support is needed unless otherwise documented below in the visit note. 

## 2014-12-13 NOTE — Patient Instructions (Signed)
Fill the  prescription for Prednisone if cough or wheezing not better in the next 48  Hours with new medications. 

## 2014-12-21 ENCOUNTER — Encounter: Payer: Self-pay | Admitting: Podiatry

## 2014-12-21 ENCOUNTER — Ambulatory Visit (INDEPENDENT_AMBULATORY_CARE_PROVIDER_SITE_OTHER): Payer: Medicare Other | Admitting: Podiatry

## 2014-12-21 VITALS — BP 167/73 | HR 72

## 2014-12-21 DIAGNOSIS — L600 Ingrowing nail: Secondary | ICD-10-CM | POA: Diagnosis not present

## 2014-12-21 DIAGNOSIS — M659 Synovitis and tenosynovitis, unspecified: Secondary | ICD-10-CM

## 2014-12-21 DIAGNOSIS — M65979 Unspecified synovitis and tenosynovitis, unspecified ankle and foot: Secondary | ICD-10-CM

## 2014-12-21 DIAGNOSIS — M6588 Other synovitis and tenosynovitis, other site: Secondary | ICD-10-CM

## 2014-12-21 NOTE — Progress Notes (Signed)
Subjective: 81 year old female presents complaining of problematic toe nails. Also experiencing pain in right lateral ankle. She used her own ankle support that helped. Also getting shooting pain from the balls of both feet at times.   Objective: Dermatologic: Deformed toe nails 2-3 bilateral. Vascular: All pedal pulses are palpable. Right DP is weaker than the left. Neuro: Normal response to Monofilament testing on both feet. Subjective numbness and tingling on left foot.  Orthopedic: Severe bunion bilateral. Contracted lesser digits bilateral.  Assessment: Hallux valgus with bilateral bunion deformity. Severe pronation STJ bilateral. Tenosynovitis right lateral ankle.  Painful toe nails.   Plan: Reviewed findings and available options. Debrided all nails.  Continue with ankle support as needed. May use Advil if needed.

## 2014-12-21 NOTE — Patient Instructions (Signed)
Seen for hypertrophic nails. All nails debrided. Return in 3 months or as needed.  

## 2014-12-25 ENCOUNTER — Other Ambulatory Visit: Payer: Self-pay | Admitting: Emergency Medicine

## 2014-12-25 ENCOUNTER — Ambulatory Visit: Payer: Medicare Other | Admitting: Internal Medicine

## 2014-12-25 MED ORDER — ATORVASTATIN CALCIUM 40 MG PO TABS: 40.0000 mg | ORAL_TABLET | Freq: Every day | ORAL | Status: DC

## 2014-12-26 ENCOUNTER — Ambulatory Visit: Payer: Medicare Other | Admitting: Internal Medicine

## 2014-12-26 ENCOUNTER — Other Ambulatory Visit: Payer: Self-pay | Admitting: Internal Medicine

## 2014-12-26 MED ORDER — RAMIPRIL 10 MG PO CAPS: 10.0000 mg | ORAL_CAPSULE | Freq: Every day | ORAL | Status: DC

## 2014-12-26 NOTE — Addendum Note (Signed)
Addended by: Lowella Dandy on: 12/26/2014 12:12 PM   Modules accepted: Orders

## 2014-12-26 NOTE — Telephone Encounter (Signed)
erx request from pharmacy sent in for 5 mg of ramipril.   Fax request from pharmacy sent in for 10 mg of ramipril.   Rx for ramipril approved to be 10 mg daily by PCP.

## 2014-12-27 ENCOUNTER — Other Ambulatory Visit: Payer: Self-pay | Admitting: Internal Medicine

## 2014-12-27 ENCOUNTER — Encounter: Payer: Self-pay | Admitting: Family

## 2014-12-27 ENCOUNTER — Ambulatory Visit (INDEPENDENT_AMBULATORY_CARE_PROVIDER_SITE_OTHER): Payer: Medicare Other | Admitting: Family

## 2014-12-27 VITALS — BP 160/82 | HR 65 | Resp 18 | Ht 59.0 in | Wt 146.0 lb

## 2014-12-27 DIAGNOSIS — M4806 Spinal stenosis, lumbar region: Secondary | ICD-10-CM | POA: Diagnosis not present

## 2014-12-27 DIAGNOSIS — Z23 Encounter for immunization: Secondary | ICD-10-CM

## 2014-12-27 DIAGNOSIS — M48061 Spinal stenosis, lumbar region without neurogenic claudication: Secondary | ICD-10-CM

## 2014-12-27 NOTE — Progress Notes (Signed)
Subjective:    Patient ID: [name], female    DOB: May 30, 1930, 81 y.o.   MRN: [id number]  Chief Complaint  Patient presents with  . Back Pain    having pain in her back, talked about injections with dr. hopper, it was a decision between surgery or injections she did not want to have surgery but has not started injections bc she wanted to hold off but now she feels like she needs the injections and was told to get an order from PCP to start them with her back specialist    HPI:  [name] is a 81 y.o. female with a PMH of anemia, arthritis, diabetes, GERD, hyperlipidemia, hypertension, appendectomy, coronary artery bypass graft, hysterectomy, and spinal stenosis who presents today for an acute office visit.  This is a chronic problem. Associated symptom of spinal stenosis located in her lumbar spine has been going on for quite a while. She was recently evaluated for her spinal stenosis by [name] and it was determined that she did not want to have surgery at that time but was also not ready for spinal injections. Notes that her symptoms of back pain with radiculopathy has worsened in the last several days. Describes the pain as numbness and tingling with the right side being worse than the left. Continues to be able to ambulate and perform her activities of daily living. Denies saddle anesthesia or changes to bowel or bladder habits.  No Known Allergies  Current Outpatient Prescriptions on File Prior to Visit  Medication Sig Dispense Refill  . amLODipine (NORVASC) 5 MG tablet Take 1 tablet (5 mg total) by mouth daily. 90 tablet 3  . aspirin EC 81 MG tablet Take 81 mg by mouth daily.    . carvedilol (COREG) 6.25 MG tablet TAKE 1 TABLET TWICE A DAY  WITH MEALS 60 tablet 0  . cetirizine (ZYRTEC) 10 MG tablet TAKE 1 TABLET (10 MG TOTAL) BY MOUTH DAILY. 30 tablet 11  . Cyanocobalamin (VITAMIN B-12 PO) Take 1 tablet by mouth daily with breakfast.     . DULoxetine (CYMBALTA)  30 MG capsule Take 1 capsule (30 mg total) by mouth daily. 30 capsule 3  . HYDROcodone-homatropine (HYCODAN) 5-1.5 MG/5ML syrup Take 5 mLs by mouth every 8 (eight) hours as needed for cough. 120 mL 0  . meloxicam (MOBIC) 7.5 MG tablet Take 1 tablet (7.5 mg total) by mouth daily. 90 tablet 1  . omeprazole (PRILOSEC) 40 MG capsule Take 1 capsule (40 mg total) by mouth daily. 90 capsule 1  . OVER THE COUNTER MEDICATION Take 2 tablets by mouth daily as needed. "Dietary Supplement"    . polyethylene glycol (MIRALAX / GLYCOLAX) packet Take 17 g by mouth 2 (two) times daily. 14 each 0  . ramipril (ALTACE) 10 MG capsule Take 1 capsule (10 mg total) by mouth daily. 90 capsule 3  . senna (SENOKOT) 8.6 MG TABS Take 2 tablets by mouth as needed. For constipation    . VITAMIN E PO Take 1 capsule by mouth daily with breakfast.      No current facility-administered medications on file prior to visit.    Review of Systems  Constitutional: Negative for fever and chills.  Musculoskeletal: Positive for back pain.  Neurological: Positive for numbness. Negative for weakness.      Objective:    BP 160/82 mmHg  Pulse 65  Resp 18  Ht 4\' 11"  (1.499 m)  Wt 146 lb (66.225 kg)  BMI  29.47 kg/m2  SpO2 98% Nursing note and vital signs reviewed.  Physical Exam  Constitutional: She is oriented to person, place, and time. She appears well-developed and well-nourished. No distress.  Cardiovascular: Normal rate, regular rhythm, normal heart sounds and intact distal pulses.   Pulmonary/Chest: Effort normal and breath sounds normal.  Musculoskeletal:  Low back - no obvious deformity, discoloration, or edema lower back noted. Palpable tenderness along the right paraspinal musculature and right sciatic nerve. Range of motion is slightly decreased in flexion otherwise normal with pain experienced in lateral bending. Straight leg raise is positive. Distal pulses and sensation are intact and appropriate.  Neurological: She  is alert and oriented to person, place, and time.  Skin: Skin is warm and dry.  Psychiatric: She has a normal mood and affect. Her behavior is normal. Judgment and thought content normal.       Assessment & Plan:   Problem List Items Addressed This Visit      Other   Spinal stenosis of lumbar region - Primary    Symptoms remain consistent with worsening symptoms of spinal stenosis. Continue current dosage of meloxicam. Continue home exercise. Follow up with [facility] to initiate spinal injections as previously discussed. Follow up with primary care as needed.

## 2014-12-27 NOTE — Patient Instructions (Addendum)
Thank you for choosing [facility].  Summary/Instructions:  Please follow up with Dr. Mina Marble or [facility] to schedule for injections.   If your symptoms worsen or fail to improve, please contact our office for further instruction, or in case of emergency go directly to the emergency room at the closest medical facility.    Spinal Stenosis Spinal stenosis is an abnormal narrowing of the canals of your spine (vertebrae). CAUSES  Spinal stenosis is caused by areas of bone pushing into the central canals of your vertebrae. This condition can be present at birth (congenital). It also may be caused by arthritic deterioration of your vertebrae (spinal degeneration).  SYMPTOMS   Pain that is generally worse with activities, particularly standing and walking.  Numbness, tingling, hot or cold sensations, weakness, or weariness in your legs.  Frequent episodes of falling.  A foot-slapping gait that leads to muscle weakness. DIAGNOSIS  Spinal stenosis is diagnosed with the use of magnetic resonance imaging (MRI) or computed tomography (CT). TREATMENT  Initial therapy for spinal stenosis focuses on the management of the pain and other symptoms associated with the condition. These therapies include:  Practicing postural changes to lessen pressure on your nerves.  Exercises to strengthen the core of your body.  Loss of excess body weight.  The use of nonsteroidal anti-inflammatory medicines to reduce swelling and inflammation in your nerves. When therapies to manage pain are not successful, surgery to treat spinal stenosis may be recommended. This surgery involves removing excess bone, which puts pressure on your nerve roots. During this surgery (laminectomy), the posterior boney arch (lamina) and excess bone around the facet joints are removed. Document Released: 06/27/2003 Document Revised: 08/21/2013 Document Reviewed: 07/15/2012 [facility] Patient Information 2015 [place], [place]. This  information is not intended to replace advice given to you by your health care provider. Make sure you discuss any questions you have with your health care provider.

## 2014-12-27 NOTE — Progress Notes (Signed)
Pre visit review using our clinic review tool, if applicable. No additional management support is needed unless otherwise documented below in the visit note. 

## 2014-12-27 NOTE — Assessment & Plan Note (Signed)
Symptoms remain consistent with worsening symptoms of spinal stenosis. Continue current dosage of meloxicam. Continue home exercise. Follow up with [facility] to initiate spinal injections as previously discussed. Follow up with primary care as needed.

## 2014-12-30 IMAGING — DX DG CHEST 2V
2 series · 2 of 2 positions shown · non-contrast
Comparison: none

CLINICAL DATA: Left-sided chest pain radiating into left back.

EXAM:
CHEST - 2 VIEW

[chest pa]
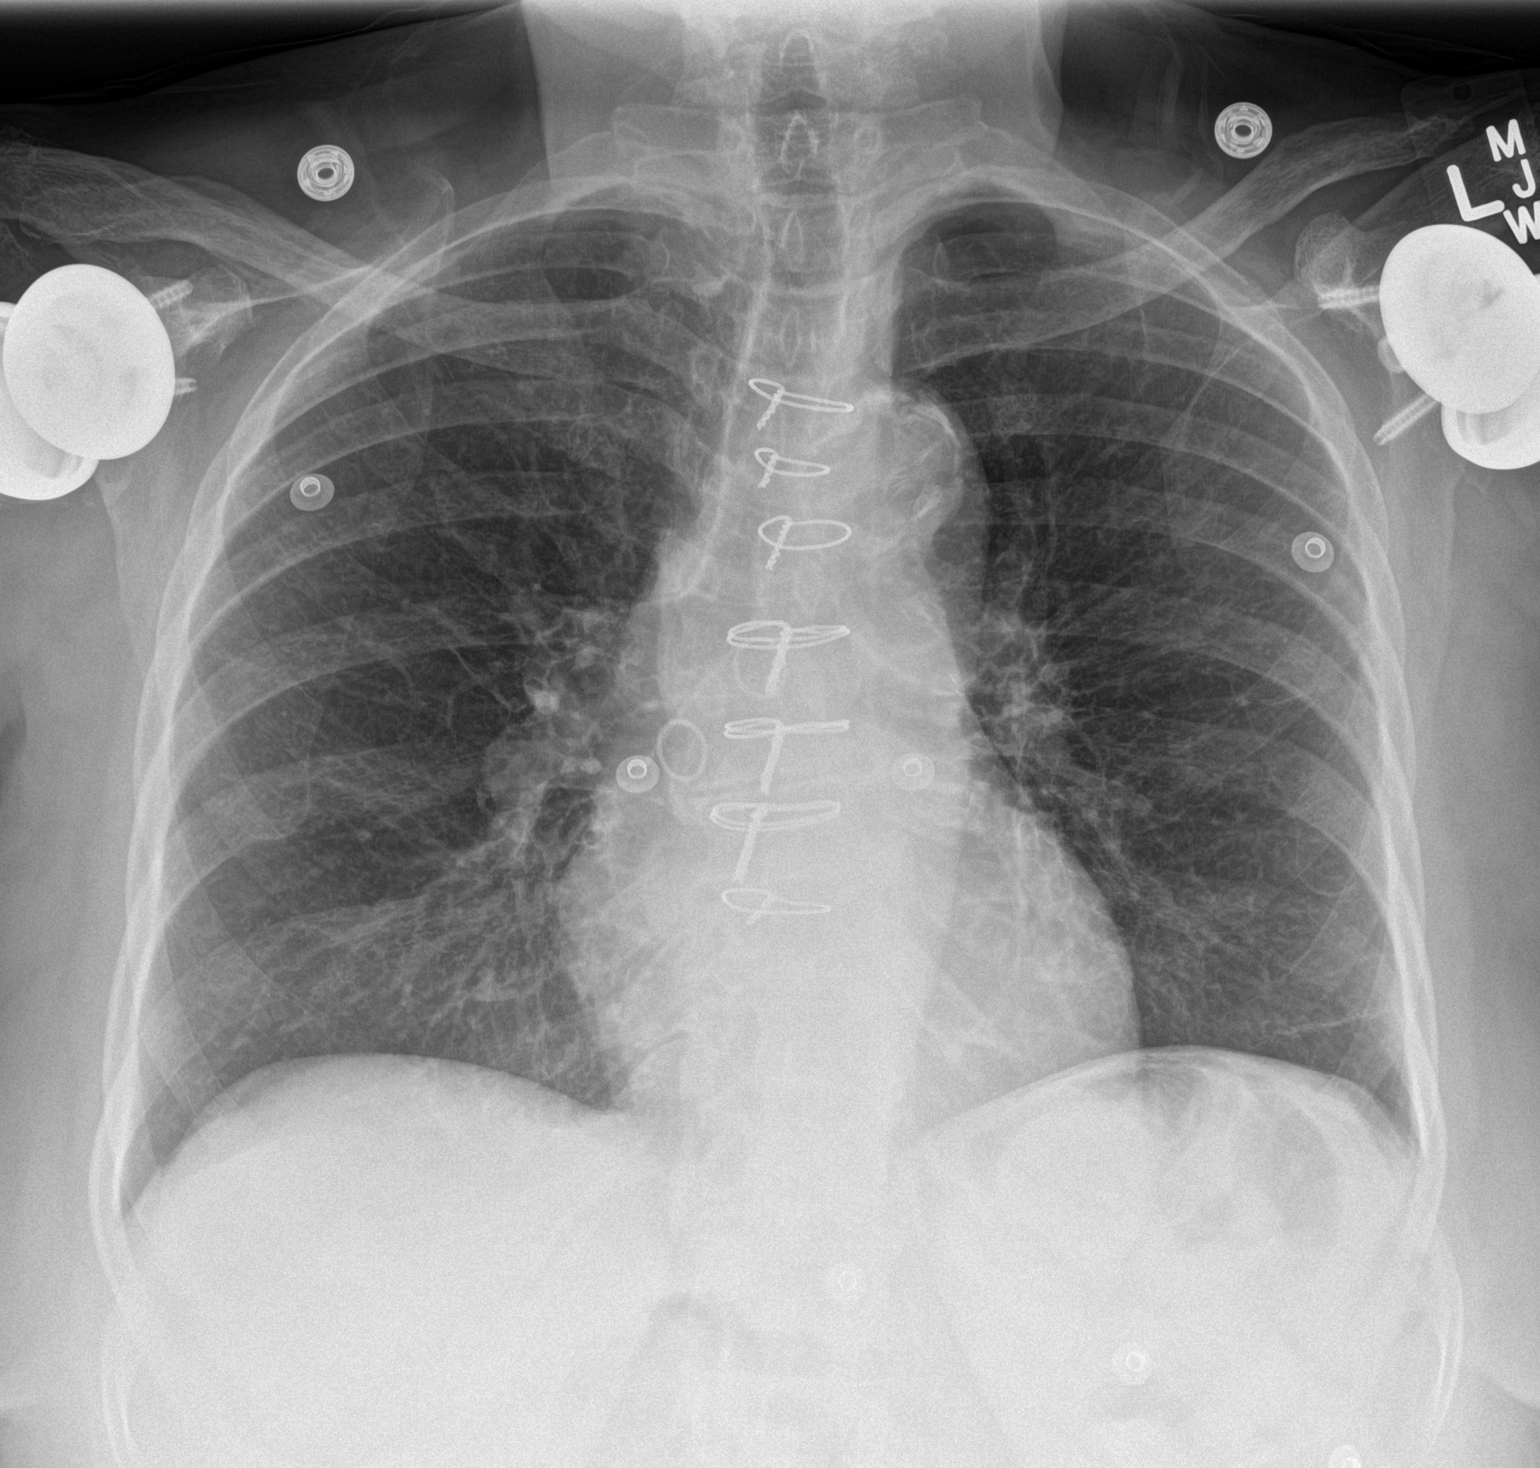

[chest lat]
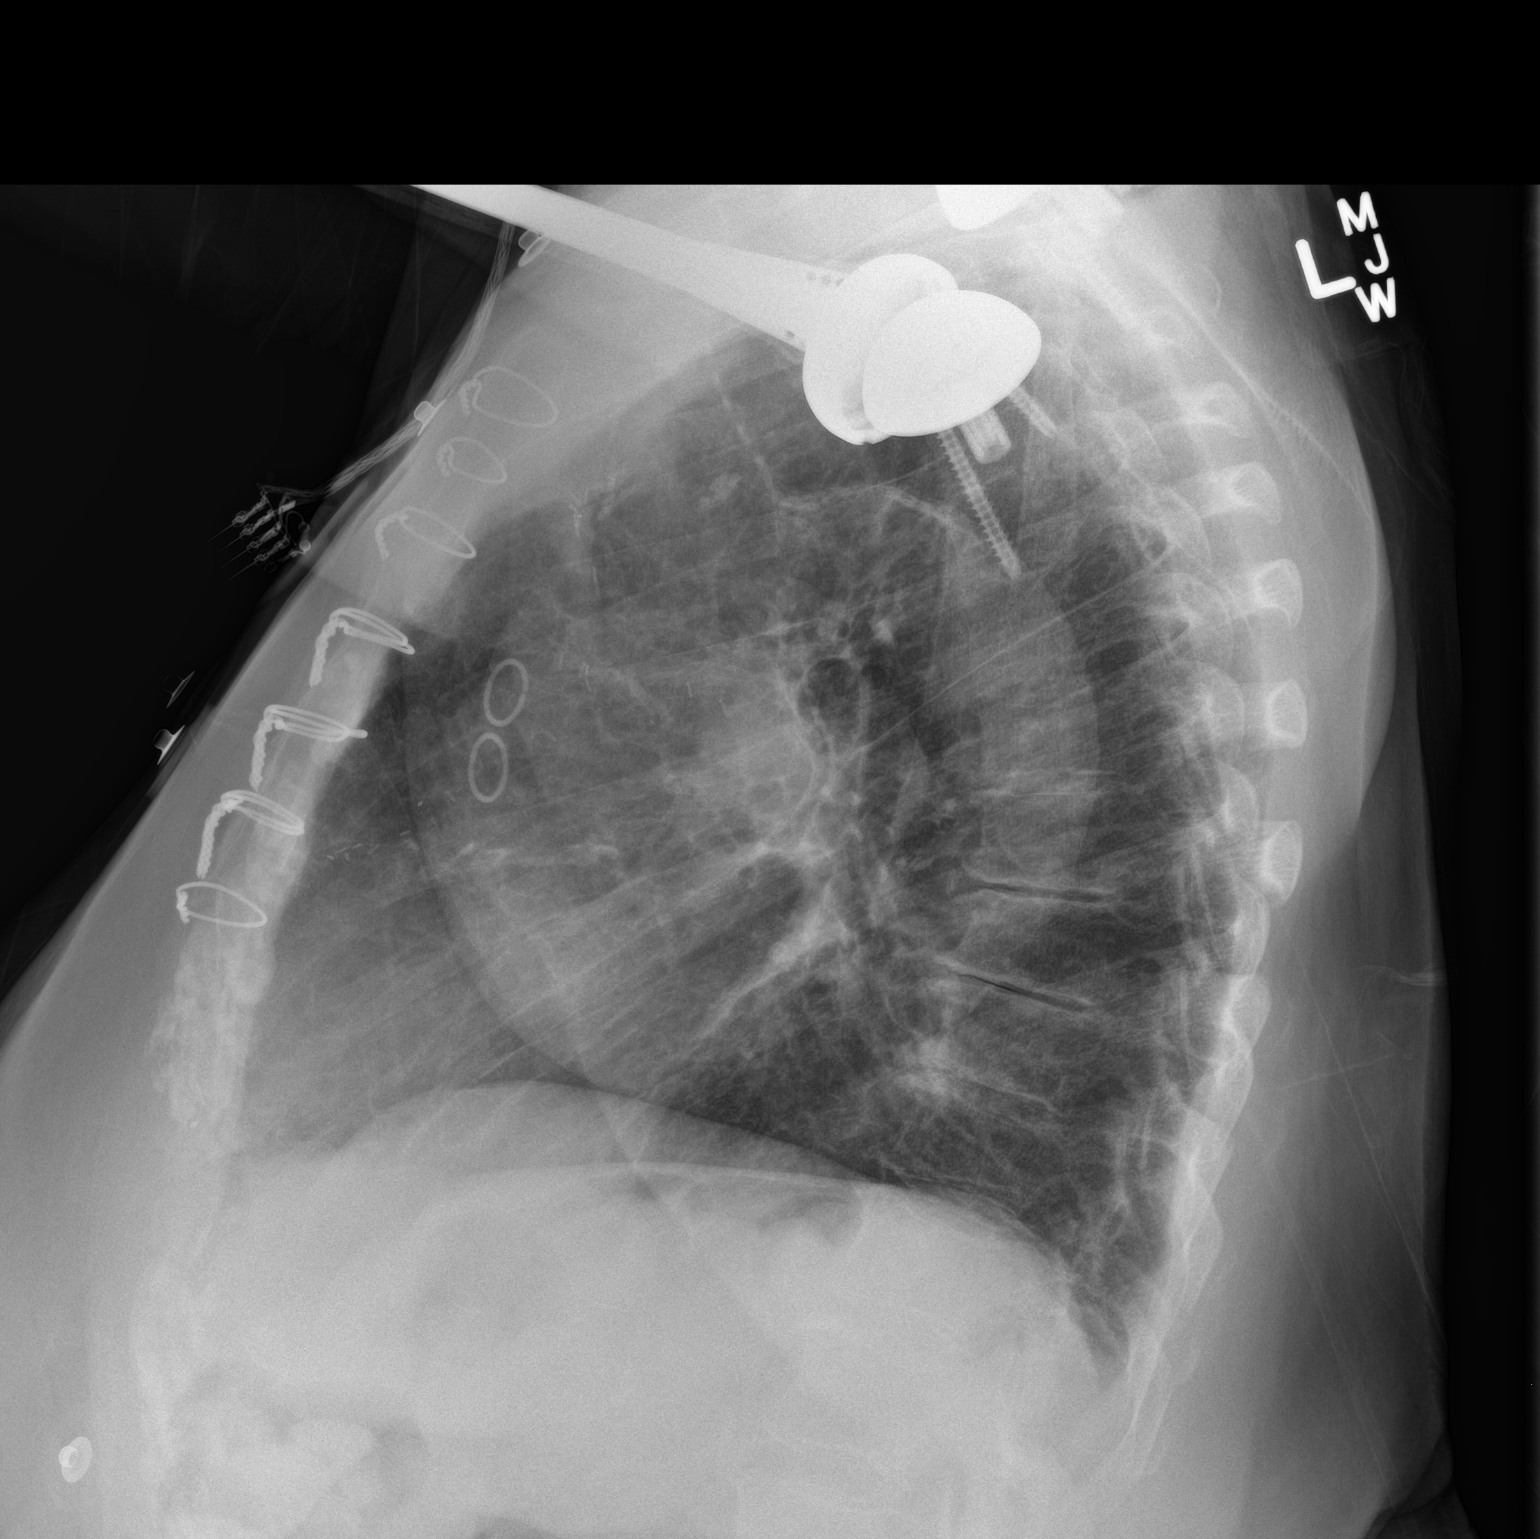

[2 of 2 positions shown; findings below may reference images not displayed]

FINDINGS: The heart size and mediastinal contours are within normal limits.
Stable appearance post CABG. Stable mild scarring at the left
lateral lung base. There is no evidence of pulmonary edema,
consolidation, pneumothorax, nodule or pleural fluid. The visualized
skeletal structures are unremarkable.
IMPRESSION: No active disease.

## 2015-01-05 ENCOUNTER — Other Ambulatory Visit: Payer: Self-pay | Admitting: Internal Medicine

## 2015-01-07 DIAGNOSIS — M5416 Radiculopathy, lumbar region: Secondary | ICD-10-CM | POA: Diagnosis not present

## 2015-01-21 DIAGNOSIS — M5416 Radiculopathy, lumbar region: Secondary | ICD-10-CM | POA: Diagnosis not present

## 2015-01-23 ENCOUNTER — Other Ambulatory Visit: Payer: Self-pay | Admitting: Internal Medicine

## 2015-01-31 DIAGNOSIS — M5416 Radiculopathy, lumbar region: Secondary | ICD-10-CM | POA: Diagnosis not present

## 2015-01-31 DIAGNOSIS — M4806 Spinal stenosis, lumbar region: Secondary | ICD-10-CM | POA: Diagnosis not present

## 2015-02-15 DIAGNOSIS — M791 Myalgia: Secondary | ICD-10-CM | POA: Diagnosis not present

## 2015-02-15 DIAGNOSIS — M4806 Spinal stenosis, lumbar region: Secondary | ICD-10-CM | POA: Diagnosis not present

## 2015-02-16 ENCOUNTER — Other Ambulatory Visit: Payer: Self-pay | Admitting: Internal Medicine

## 2015-02-18 ENCOUNTER — Other Ambulatory Visit: Payer: Self-pay

## 2015-02-18 DIAGNOSIS — Z1231 Encounter for screening mammogram for malignant neoplasm of breast: Secondary | ICD-10-CM

## 2015-02-21 DIAGNOSIS — M5416 Radiculopathy, lumbar region: Secondary | ICD-10-CM | POA: Diagnosis not present

## 2015-02-21 DIAGNOSIS — M545 Low back pain: Secondary | ICD-10-CM | POA: Diagnosis not present

## 2015-02-25 DIAGNOSIS — M5416 Radiculopathy, lumbar region: Secondary | ICD-10-CM | POA: Diagnosis not present

## 2015-02-25 DIAGNOSIS — M545 Low back pain: Secondary | ICD-10-CM | POA: Diagnosis not present

## 2015-02-28 DIAGNOSIS — M5416 Radiculopathy, lumbar region: Secondary | ICD-10-CM | POA: Diagnosis not present

## 2015-02-28 DIAGNOSIS — M545 Low back pain: Secondary | ICD-10-CM | POA: Diagnosis not present

## 2015-03-04 ENCOUNTER — Ambulatory Visit: Payer: Medicare Other | Admitting: Internal Medicine

## 2015-03-04 DIAGNOSIS — M5416 Radiculopathy, lumbar region: Secondary | ICD-10-CM | POA: Diagnosis not present

## 2015-03-04 DIAGNOSIS — M545 Low back pain: Secondary | ICD-10-CM | POA: Diagnosis not present

## 2015-03-11 DIAGNOSIS — M5416 Radiculopathy, lumbar region: Secondary | ICD-10-CM | POA: Diagnosis not present

## 2015-03-11 DIAGNOSIS — M545 Low back pain: Secondary | ICD-10-CM | POA: Diagnosis not present

## 2015-03-12 ENCOUNTER — Encounter: Payer: Self-pay | Admitting: Podiatry

## 2015-03-12 ENCOUNTER — Ambulatory Visit (INDEPENDENT_AMBULATORY_CARE_PROVIDER_SITE_OTHER): Payer: Medicare Other | Admitting: Podiatry

## 2015-03-12 VITALS — BP 180/71 | HR 60

## 2015-03-12 DIAGNOSIS — B351 Tinea unguium: Secondary | ICD-10-CM | POA: Diagnosis not present

## 2015-03-12 DIAGNOSIS — M79606 Pain in leg, unspecified: Secondary | ICD-10-CM

## 2015-03-12 NOTE — Patient Instructions (Signed)
Seen for hypertrophic nails. All nails debrided. Return in 3 months or as needed.  

## 2015-03-12 NOTE — Progress Notes (Signed)
Subjective: 81 year old female presents complaining of problematic toe nails.  Having pain in right hip and pain in foot in the morning. Ankles are ok now. Takes physical therapy for hip pain.   Objective: Dermatologic: Deformed toe nails 2-3 bilateral. Vascular: All pedal pulses are palpable. Right DP is weaker than the left. Neuro: Normal response to Monofilament testing on both feet. Subjective numbness and tingling on left foot.  Orthopedic: Severe bunion bilateral. Contracted lesser digits bilateral.  Assessment: Hallux valgus with bilateral bunion deformity. Severe pronation STJ bilateral. Tenosynovitis right lateral ankle.  Painful toe nails.   Plan: Reviewed findings and available options. Debrided all nails.

## 2015-03-18 ENCOUNTER — Encounter: Payer: Self-pay | Admitting: Internal Medicine

## 2015-03-18 ENCOUNTER — Ambulatory Visit (INDEPENDENT_AMBULATORY_CARE_PROVIDER_SITE_OTHER): Payer: Medicare Other | Admitting: Internal Medicine

## 2015-03-18 VITALS — BP 162/90 | HR 60 | Ht 59.0 in | Wt 158.4 lb

## 2015-03-18 DIAGNOSIS — E785 Hyperlipidemia, unspecified: Secondary | ICD-10-CM | POA: Diagnosis not present

## 2015-03-18 DIAGNOSIS — I25810 Atherosclerosis of coronary artery bypass graft(s) without angina pectoris: Secondary | ICD-10-CM | POA: Diagnosis not present

## 2015-03-18 MED ORDER — AMLODIPINE BESYLATE 5 MG PO TABS: 5.0000 mg | ORAL_TABLET | Freq: Two times a day (BID) | ORAL | Status: DC

## 2015-03-18 MED ORDER — ATORVASTATIN CALCIUM 40 MG PO TABS: 40.0000 mg | ORAL_TABLET | Freq: Every day | ORAL | Status: DC

## 2015-03-18 NOTE — Progress Notes (Signed)
Cardiology Office Note   Date:  03/18/2015   ID:  [name], DOB 13-Feb-1931, MRN [id number]  PCP:  Gwendolyn Grant, MD  Cardiologist:   Dorris Carnes, MD   No chief complaint on file.     History of Present Illness: [name] is a 81 y.o. female with a history of CAD (s/p CABG in 2010), HTN, HL and fibromyalgia. I saw her in 2015  Since seen she denies CP  Breathing is OK Biggest complaint is pain in R leg due to spinal stenosis  Injections have not helped much She is followed by Dr Berenice Primas.       Current Outpatient Prescriptions  Medication Sig Dispense Refill  . amLODipine (NORVASC) 5 MG tablet Take 1 tablet (5 mg total) by mouth daily. 90 tablet 3  . aspirin EC 81 MG tablet Take 81 mg by mouth daily.    . carvedilol (COREG) 6.25 MG tablet TAKE 1 TABLET TWICE A DAY  WITH MEALS 60 tablet 11  . cetirizine (ZYRTEC) 10 MG tablet TAKE 1 TABLET (10 MG TOTAL) BY MOUTH DAILY. 30 tablet 11  . Cyanocobalamin (VITAMIN B-12 PO) Take 1 tablet by mouth daily with breakfast.     . DULoxetine (CYMBALTA) 30 MG capsule Take 1 capsule (30 mg total) by mouth daily. 30 capsule 3  . HYDROcodone-homatropine (HYCODAN) 5-1.5 MG/5ML syrup Take 5 mLs by mouth every 8 (eight) hours as needed for cough. 120 mL 0  . meloxicam (MOBIC) 7.5 MG tablet Take 1 tablet (7.5 mg total) by mouth daily. 30 tablet 01  . omeprazole (PRILOSEC) 40 MG capsule Take 1 capsule (40 mg total) by mouth daily. 90 capsule 1  . OVER THE COUNTER MEDICATION Take 2 tablets by mouth daily as needed. "Dietary Supplement"    . polyethylene glycol (MIRALAX / GLYCOLAX) packet Take 17 g by mouth 2 (two) times daily. 14 each 0  . ramipril (ALTACE) 10 MG capsule Take 1 capsule (10 mg total) by mouth daily. 90 capsule 3  . senna (SENOKOT) 8.6 MG TABS Take 2 tablets by mouth as needed. For constipation    . VITAMIN E PO Take 1 capsule by mouth daily with breakfast.      No current facility-administered medications for this  visit.    Allergies:   Review of patient's allergies indicates no known allergies.   Past Medical History  Diagnosis Date  . S/P CABG x 1   . S/P inguinal hernia repair   . TUBULOVILLOUS ADENOMA, 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    colo 04/2012 - no polyps - no further screening planned (age >2)  . CAD, NATIVE VESSEL 06/09/2008  . UNSPECIFIED ANEMIA   . DIABETES MELLITUS, TYPE II   . GERD   . HYPERLIPIDEMIA   . HYPERTENSION   . HIATAL HERNIA   . FIBROMYALGIA   . OA (osteoarthritis)     severe, shoulders, hands - inflammatory, ?RA  . Gout   . Insomnia     Past Surgical History  Procedure Laterality Date  . Hernia repair    . Abdominal hysterectomy  1981  . Coronary artery bypass graft  2010  . Appendectomy    . Replacement total knee bilateral Bilateral     Rt-1974, Lt -1989  . Rotator cuff repair      Left  . Ovarian cyst removal    . Cataract extraction, bilateral    . Reverse shoulder arthroplasty  09/08/2011    Procedure: REVERSE SHOULDER ARTHROPLASTY;  Surgeon: Larkin Ina  Caffie Damme, MD;  Location: [facility];  Service: Orthopedics;  Laterality: Left;  left reverse total shoulder arthroplasty  . Joint replacement       Social History:  The patient  reports that she quit smoking about 44 years ago. She has never used smokeless tobacco. She reports that she drinks about 0.6 oz of alcohol per week. She reports that she does not use illicit drugs.   Family History:  The patient's family history includes Cancer in her brother and mother; Diabetes in her brother, daughter, father, and sister; Heart attack in her brother and father; Heart disease in her sister; Heart disease (age of onset: 35) in her father; Hypertension in her daughter and mother; Stomach cancer (age of onset: 18) in her mother. There is no history of Anesthesia problems or Colon cancer.    ROS:  Please see the history of present illness. All other systems are reviewed and  Negative to the above problem except as noted.     PHYSICAL EXAM: VS:  BP 162/90 mmHg  Pulse 60  Ht 4\' 11"  (1.499 m)  Wt 71.85 kg (158 lb 6.4 oz)  BMI 31.98 kg/m2  GEN: Well nourished, well developed, in no acute distress HEENT: normal Neck: no JVD, carotid bruits, or masses Cardiac: RRR; no murmurs, rubs, or gallops,no edema  Respiratory:  clear to auscultation bilaterally, normal work of breathing GI: soft, nontender, nondistended, + BS  No hepatomegaly  MS: no deformity Moving all extremities   Skin: warm and dry, no rash Neuro:  Strength and sensation are intact Psych: euthymic mood, full affect   EKG:  EKG is ordered today.  SR 60 bpm     Lipid Panel    Component Value Date/Time   CHOL 239* 11/26/2014 1618   TRIG 85.0 11/26/2014 1618   HDL 107.30 11/26/2014 1618   CHOLHDL 2 11/26/2014 1618   VLDL 17.0 11/26/2014 1618   LDLCALC 114* 11/26/2014 1618   LDLDIRECT 115.3 09/30/2009 1059      Wt Readings from Last 3 Encounters:  03/18/15 71.85 kg (158 lb 6.4 oz)  12/27/14 66.225 kg (146 lb)  12/13/14 65.318 kg (144 lb)      ASSESSMENT AND PLAN:  1  CAD  No symptoms to sugg angina  Keep on current regimen  2.  HL  Add back Lipitor  40  3.  HTN  BP is high  I would recomm increasing amlodipine to bid  Will need to be followed   4.  Ortho.  Pt followed for spinal stenosis.  If she requires any more invasive Rx I think she would tolerate it from the cardiac standpoint  F/U in clinic in 9 months      Signed, Dorris Carnes, MD  03/18/2015 4:07 PM    [address], [place], [place]  [address] Phone: [phone]; Fax: [phone]

## 2015-03-18 NOTE — Patient Instructions (Signed)
Medication Instructions:  Take lipitor 40mg  daily.  Increase amlodipine to 5mg  two times a day  Labwork: None today  Testing/Procedures: None today  Follow-Up: Your physician wants you to follow-up in: September 2017 with Dr Harrington Challenger.  You will receive a reminder letter in the mail two months in advance. If you don't receive a letter, please call our office to schedule the follow-up appointment.        If you need a refill on your cardiac medications before your next appointment, please call your pharmacy.

## 2015-03-22 ENCOUNTER — Ambulatory Visit: Payer: Medicare Other

## 2015-03-23 ENCOUNTER — Other Ambulatory Visit: Payer: Self-pay | Admitting: Internal Medicine

## 2015-03-25 ENCOUNTER — Ambulatory Visit
Admission: RE | Admit: 2015-03-25 | Discharge: 2015-03-25 | Disposition: A | Discharge status: Home / Self Care | Payer: Medicare Other | Source: Ambulatory Visit

## 2015-03-25 ENCOUNTER — Telehealth: Payer: Self-pay | Admitting: Internal Medicine

## 2015-03-25 DIAGNOSIS — Z1231 Encounter for screening mammogram for malignant neoplasm of breast: Secondary | ICD-10-CM

## 2015-03-25 DIAGNOSIS — N644 Mastodynia: Secondary | ICD-10-CM

## 2015-03-25 NOTE — Telephone Encounter (Signed)
Spoke to pt and confirmed the pain location and location to have ultrasound done.  Confirmed with [facility] what, where and how to enter the order.   Order entered and signed.

## 2015-03-25 NOTE — Telephone Encounter (Signed)
12.5.16 Pt is requesting an OK for a breast exam at the [facility]. Stated she talked with Dr. Linna Darner at her last visit about the pain she's experiencing and she needs a confirmation from a doctor before she can be seen at [facility]. Please call pt with questions or concerns regarding this issue. MS

## 2015-04-02 ENCOUNTER — Encounter: Payer: Self-pay | Admitting: Internal Medicine

## 2015-04-02 ENCOUNTER — Ambulatory Visit
Admission: RE | Admit: 2015-04-02 | Discharge: 2015-04-02 | Disposition: A | Discharge status: Home / Self Care | Payer: Medicare Other | Source: Ambulatory Visit | Attending: Internal Medicine | Admitting: Internal Medicine

## 2015-04-02 ENCOUNTER — Ambulatory Visit (INDEPENDENT_AMBULATORY_CARE_PROVIDER_SITE_OTHER): Payer: Medicare Other | Admitting: Internal Medicine

## 2015-04-02 ENCOUNTER — Other Ambulatory Visit (INDEPENDENT_AMBULATORY_CARE_PROVIDER_SITE_OTHER): Payer: Medicare Other

## 2015-04-02 VITALS — BP 140/80 | HR 85 | Temp 97.8°F | Ht 59.0 in | Wt 156.5 lb

## 2015-04-02 DIAGNOSIS — Z23 Encounter for immunization: Secondary | ICD-10-CM

## 2015-04-02 DIAGNOSIS — E1140 Type 2 diabetes mellitus with diabetic neuropathy, unspecified: Secondary | ICD-10-CM

## 2015-04-02 DIAGNOSIS — I25810 Atherosclerosis of coronary artery bypass graft(s) without angina pectoris: Secondary | ICD-10-CM | POA: Diagnosis not present

## 2015-04-02 DIAGNOSIS — I10 Essential (primary) hypertension: Secondary | ICD-10-CM

## 2015-04-02 DIAGNOSIS — Z0000 Encounter for general adult medical examination without abnormal findings: Secondary | ICD-10-CM

## 2015-04-02 DIAGNOSIS — N644 Mastodynia: Secondary | ICD-10-CM

## 2015-04-02 DIAGNOSIS — E785 Hyperlipidemia, unspecified: Secondary | ICD-10-CM

## 2015-04-02 DIAGNOSIS — M48061 Spinal stenosis, lumbar region without neurogenic claudication: Secondary | ICD-10-CM

## 2015-04-02 DIAGNOSIS — M4806 Spinal stenosis, lumbar region: Secondary | ICD-10-CM

## 2015-04-02 DIAGNOSIS — R928 Other abnormal and inconclusive findings on diagnostic imaging of breast: Secondary | ICD-10-CM | POA: Diagnosis not present

## 2015-04-02 LAB — BASIC METABOLIC PANEL
BUN: 19 mg/dL (ref 6–23)
CO2: 30 mEq/L (ref 19–32)
Calcium: 9.5 mg/dL (ref 8.4–10.5)
Chloride: 104 mEq/L (ref 96–112)
Creatinine, Ser: 0.82 mg/dL (ref 0.40–1.20)
GFR: 85.66 mL/min (ref >60.00)
Glucose, Bld: 110 mg/dL — ABNORMAL HIGH (ref 70–99)
Potassium: 4.3 mEq/L (ref 3.5–5.1)
Sodium: 141 mEq/L (ref 135–145)

## 2015-04-02 LAB — MICROALBUMIN / CREATININE URINE RATIO
Creatinine,U: 144.2 mg/dL
Microalb Creat Ratio: 1 mg/g (ref 0.0–30.0)
Microalb, Ur: 1.5 mg/dL (ref 0.0–1.9)

## 2015-04-02 LAB — HEMOGLOBIN A1C: Hgb A1c MFr Bld: 6.2 % (ref 4.6–6.5)

## 2015-04-02 MED ORDER — RAMIPRIL 10 MG PO CAPS: 10.0000 mg | ORAL_CAPSULE | Freq: Every day | ORAL | Status: DC

## 2015-04-02 MED ORDER — CETIRIZINE HCL 10 MG PO TABS: 10.0000 mg | ORAL_TABLET | Freq: Every day | ORAL | Status: DC

## 2015-04-02 MED ORDER — CARVEDILOL 6.25 MG PO TABS: 6.2500 mg | ORAL_TABLET | Freq: Two times a day (BID) | ORAL | Status: DC

## 2015-04-02 MED ORDER — MELOXICAM 7.5 MG PO TABS: 7.5000 mg | ORAL_TABLET | Freq: Every day | ORAL | Status: DC

## 2015-04-02 MED ORDER — OMEPRAZOLE 40 MG PO CPDR: 40.0000 mg | DELAYED_RELEASE_CAPSULE | Freq: Every day | ORAL | Status: DC

## 2015-04-02 MED ORDER — DULOXETINE HCL 30 MG PO CPEP: 30.0000 mg | ORAL_CAPSULE | Freq: Every day | ORAL | Status: DC

## 2015-04-02 MED ORDER — GABAPENTIN 300 MG PO CAPS: 300.0000 mg | ORAL_CAPSULE | Freq: Three times a day (TID) | ORAL | Status: DC

## 2015-04-02 NOTE — Progress Notes (Signed)
Subjective:    Patient ID: [name], female    DOB: 01/31/1931, 81 y.o.   MRN: [id number]  HPI   Here for medicare wellness  Diet: heart healthy and low carb Physical activity: sedentary Depression/mood screen: negative Hearing: intact to whispered voice Visual acuity: grossly normal, performs annual eye exam  ADLs: capable Fall risk: none Home safety: good Cognitive evaluation: intact to orientation, naming, recall and repetition EOL planning: adv directives, full code/ I agree  I have personally reviewed and have noted 1. The patient's medical and social history 2. Their use of alcohol, tobacco or illicit drugs 3. Their current medications and supplements 4. The patient's functional ability including ADL's, fall risks, home safety risks and hearing or visual impairment. 5. Diet and physical activities 6. Evidence for depression or mood disorders  Also reviewed chronic medical conditions, interval events and current concerns: pain in back and legs  Past Medical History  Diagnosis Date  . S/P CABG x 1   . S/P inguinal hernia repair   . 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 CAD, NATIVE VESSEL 06/09/2008  . UNSPECIFIED ANEMIA   . DIABETES MELLITUS, TYPE II   . GERD   . HYPERLIPIDEMIA   . HYPERTENSION   . HIATAL HERNIA   . FIBROMYALGIA   . OA (osteoarthritis)     severe, shoulders, hands - inflammatory, ?RA  . Gout   . Insomnia    Family History  Problem Relation Age of Onset  . Cancer Mother     Stomach  . Stomach cancer Mother 69  . Hypertension Mother   . Heart disease Father 42    MI  . Diabetes Father   . Heart attack Father   . Diabetes Sister   . Heart disease Sister   . Heart attack Brother   . Cancer Brother   . Diabetes Brother   . Anesthesia problems Neg Hx   . Colon cancer Neg Hx   . Diabetes Daughter   . Hypertension Daughter    Social History  Substance  Use Topics  . Smoking status: Former Smoker    Quit date: 04/26/1970  . Smokeless tobacco: Never Used  . Alcohol Use: 0.6 oz/week    1 Glasses of wine per week    Review of Systems  Constitutional: Negative for fatigue and unexpected weight change.  Respiratory: Negative for cough, shortness of breath and wheezing.   Cardiovascular: Negative for chest pain (L breast soreness since falling against iron fence last year - for mammo today), palpitations and leg swelling.  Gastrointestinal: Negative for nausea, abdominal pain and diarrhea.  Neurological: Negative for dizziness, weakness, light-headedness and headaches.  Psychiatric/Behavioral: Negative for dysphoric mood. The patient is not nervous/anxious.   All other systems reviewed and are negative.   Patient Care Team: Rowe Clack, MD as PCP - General Tania Ade, MD as Consulting Physician (Orthopedic Surgery) Fay Records, MD as Consulting Physician (Cardiology) Unice Bailey, MD as Consulting Physician (Rheumatology) Ladene Artist, MD as Consulting Physician (Gastroenterology) Zonia Kief, MD (Rehabilitation) Rosetta Posner, MD (Vascular Surgery) Myeong Roxine Caddy, DPM (Podiatry)     Objective:    Physical Exam  Constitutional: She appears well-developed and well-nourished. No distress.  obese  Cardiovascular: Normal rate, regular rhythm and normal heart sounds.   No murmur heard. Pulmonary/Chest: Effort normal and breath sounds normal. No respiratory distress.  Musculoskeletal: She  exhibits no edema.  Vitals reviewed.   BP 140/80 mmHg  Pulse 85  Temp(Src) 97.8 F (36.6 C) (Oral)  Ht 4\' 11"  (1.499 m)  Wt 156 lb 8 oz (70.988 kg)  BMI 31.59 kg/m2  SpO2 96% Wt Readings from Last 3 Encounters:  04/02/15 156 lb 8 oz (70.988 kg)  03/18/15 158 lb 6.4 oz (71.85 kg)  12/27/14 146 lb (66.225 kg)    Lab Results  Component Value Date   WBC 7.0 04/17/2014   HGB 10.6* 04/17/2014   HCT 34.6* 04/17/2014    PLT 207 04/17/2014   GLUCOSE 118* 04/17/2014   CHOL 239* 11/26/2014   TRIG 85.0 11/26/2014   HDL 107.30 11/26/2014   LDLDIRECT 115.3 09/30/2009   LDLCALC 114* 11/26/2014   ALT 14 11/26/2014   AST 23 11/26/2014   NA 139 04/17/2014   K 3.4* 04/17/2014   CL 103 04/17/2014   CREATININE 0.74 04/17/2014   BUN 10 04/17/2014   CO2 26 04/17/2014   TSH 0.77 06/09/2013   INR 0.99 11/06/2010   HGBA1C 5.7 11/26/2014   MICROALBUR 3.4* 12/22/2012    No results found.     Assessment & Plan:   AWV/z00.00 - Today patient counseled on age appropriate routine health concerns for screening and prevention, each reviewed and up to date or declined. Immunizations reviewed and up to date or declined. Labs ordered and reviewed. Risk factors for depression reviewed and negative. Hearing function and visual acuity are intact. ADLs screened and addressed as needed. Functional ability and level of safety reviewed and appropriate. Education, counseling and referrals performed based on assessed risks today. Patient provided with a copy of personalized plan for preventive services.   Problem List Items Addressed This Visit    Essential hypertension    Fair control - continue ongoing medical therapy as ongoing ACEI increased 04/2014 and cards increased amlodipine to BID (max dose) 02/2015 = improved  BP Readings from Last 3 Encounters:  04/02/15 140/80  03/18/15 162/90  03/12/15 180/71        Relevant Medications   carvedilol (COREG) 6.25 MG tablet   ramipril (ALTACE) 10 MG capsule   Hyperlipidemia    Last lipids reviewed - at goal - check annually continue Lipitor as ongoing (hx CAD/CABG)      Relevant Medications   carvedilol (COREG) 6.25 MG tablet   ramipril (ALTACE) 10 MG capsule   Spinal stenosis of lumbar region    Chronic pain symptoms including leg pain, reviewed Following with [facility], ESI as needed (last done in 02/2015) and continue gapa + NSAIDs Increase gapa to TID today        Type 2 diabetes mellitus ([place])    Previously on metformin - stopped summer 2016 due to well controlled cbgs The current medical regimen (diet alone) appears effective;  continue present plan and medications. Check a1c q45mo -adjust as needed, esp with weight gain Lab Results  Component Value Date   HGBA1C 5.7 11/26/2014        Relevant Medications   ramipril (ALTACE) 10 MG capsule   Other Relevant Orders   Basic metabolic panel   Hemoglobin A1c   Microalbumin / creatinine urine ratio    Other Visit Diagnoses    Routine general medical examination at a health care facility    -  Primary        Gwendolyn Grant, MD

## 2015-04-02 NOTE — Assessment & Plan Note (Signed)
Fair control - continue ongoing medical therapy as ongoing ACEI increased 04/2014 and cards increased amlodipine to BID (max dose) 02/2015 = improved  BP Readings from Last 3 Encounters:  04/02/15 140/80  03/18/15 162/90  03/12/15 180/71

## 2015-04-02 NOTE — Patient Instructions (Addendum)
It was good to see you today.  We have reviewed your prior records including labs and tests today  Prevnar 13, pneumonia vaccine "booster" -updated today - Other Health Maintenance reviewed - all recommended immunizations and age-appropriate screenings are up-to-date.  Test(s) ordered today. Your results will be released to [employer] (or called to you) after review, usually within 72hours after test completion. If any changes need to be made, you will be notified at that same time.  Medications reviewed and updated Increase gabapentin to 344m THREE times a day - no other changes recommended at this time. Refill on medication(s) as discussed today.  Please schedule followup in 4-6 months with new PCP for semi annual exam and labs, call sooner if problems.   Health Maintenance, Female Adopting a healthy lifestyle and getting preventive care can go a long way to promote health and wellness. Talk with your health care provider about what schedule of regular examinations is right for you. This is a good chance for you to check in with your provider about disease prevention and staying healthy. In between checkups, there are plenty of things you can do on your own. Experts have done a lot of research about which lifestyle changes and preventive measures are most likely to keep you healthy. Ask your health care provider for more information. WEIGHT AND DIET  Eat a healthy diet  Be sure to include plenty of vegetables, fruits, low-fat dairy products, and lean protein.  Do not eat a lot of foods high in solid fats, added sugars, or salt.  Get regular exercise. This is one of the most important things you can do for your health.  Most adults should exercise for at least 150 minutes each week. The exercise should increase your heart rate and make you sweat (moderate-intensity exercise).  Most adults should also do strengthening exercises at least twice a week. This is in addition to the  moderate-intensity exercise.  Maintain a healthy weight  Body mass index (BMI) is a measurement that can be used to identify possible weight problems. It estimates body fat based on height and weight. Your health care provider can help determine your BMI and help you achieve or maintain a healthy weight.  For females 243years of age and older:   A BMI below 18.5 is considered underweight.  A BMI of 18.5 to 24.9 is normal.  A BMI of 25 to 29.9 is considered overweight.  A BMI of 30 and above is considered obese.  Watch levels of cholesterol and blood lipids  You should start having your blood tested for lipids and cholesterol at 79years of age, then have this test every 5 years.  You may need to have your cholesterol levels checked more often if:  Your lipid or cholesterol levels are high.  You are older than 79years of age.  You are at high risk for heart disease.  CANCER SCREENING   Lung Cancer  Lung cancer screening is recommended for adults 532827years old who are at high risk for lung cancer because of a history of smoking.  A yearly low-dose CT scan of the lungs is recommended for people who:  Currently smoke.  Have quit within the past 15 years.  Have at least a 30-pack-year history of smoking. A pack year is smoking an average of one pack of cigarettes a day for 1 year.  Yearly screening should continue until it has been 15 years since you quit.  Yearly screening should stop  if you develop a health problem that would prevent you from having lung cancer treatment.  Breast Cancer  Practice breast self-awareness. This means understanding how your breasts normally appear and feel.  It also means doing regular breast self-exams. Let your health care provider know about any changes, no matter how small.  If you are in your 20s or 30s, you should have a clinical breast exam (CBE) by a health care provider every 1-3 years as part of a regular health exam.  If  you are 78 or older, have a CBE every year. Also consider having a breast X-ray (mammogram) every year.  If you have a family history of breast cancer, talk to your health care provider about genetic screening.  If you are at high risk for breast cancer, talk to your health care provider about having an MRI and a mammogram every year.  Breast cancer gene (BRCA) assessment is recommended for women who have family members with BRCA-related cancers. BRCA-related cancers include:  Breast.  Ovarian.  Tubal.  Peritoneal cancers.  Results of the assessment will determine the need for genetic counseling and BRCA1 and BRCA2 testing. Cervical Cancer Your health care provider may recommend that you be screened regularly for cancer of the pelvic organs (ovaries, uterus, and vagina). This screening involves a pelvic examination, including checking for microscopic changes to the surface of your cervix (Pap test). You may be encouraged to have this screening done every 3 years, beginning at age 22.  For women ages 71-65, health care providers may recommend pelvic exams and Pap testing every 3 years, or they may recommend the Pap and pelvic exam, combined with testing for human papilloma virus (HPV), every 5 years. Some types of HPV increase your risk of cervical cancer. Testing for HPV may also be done on women of any age with unclear Pap test results.  Other health care providers may not recommend any screening for nonpregnant women who are considered low risk for pelvic cancer and who do not have symptoms. Ask your health care provider if a screening pelvic exam is right for you.  If you have had past treatment for cervical cancer or a condition that could lead to cancer, you need Pap tests and screening for cancer for at least 20 years after your treatment. If Pap tests have been discontinued, your risk factors (such as having a new sexual partner) need to be reassessed to determine if screening should  resume. Some women have medical problems that increase the chance of getting cervical cancer. In these cases, your health care provider may recommend more frequent screening and Pap tests. Colorectal Cancer  This type of cancer can be detected and often prevented.  Routine colorectal cancer screening usually begins at 79 years of age and continues through 79 years of age.  Your health care provider may recommend screening at an earlier age if you have risk factors for colon cancer.  Your health care provider may also recommend using home test kits to check for hidden blood in the stool.  A small camera at the end of a tube can be used to examine your colon directly (sigmoidoscopy or colonoscopy). This is done to check for the earliest forms of colorectal cancer.  Routine screening usually begins at age 45.  Direct examination of the colon should be repeated every 5-10 years through 79 years of age. However, you may need to be screened more often if early forms of precancerous polyps or small growths are found.  Skin Cancer  Check your skin from head to toe regularly.  Tell your health care provider about any new moles or changes in moles, especially if there is a change in a mole's shape or color.  Also tell your health care provider if you have a mole that is larger than the size of a pencil eraser.  Always use sunscreen. Apply sunscreen liberally and repeatedly throughout the day.  Protect yourself by wearing long sleeves, pants, a wide-brimmed hat, and sunglasses whenever you are outside. HEART DISEASE, DIABETES, AND HIGH BLOOD PRESSURE   High blood pressure causes heart disease and increases the risk of stroke. High blood pressure is more likely to develop in:  People who have blood pressure in the high end of the normal range (130-139/85-89 mm Hg).  People who are overweight or obese.  People who are African American.  If you are 40-78 years of age, have your blood pressure  checked every 3-5 years. If you are 24 years of age or older, have your blood pressure checked every year. You should have your blood pressure measured twice--once when you are at a hospital or clinic, and once when you are not at a hospital or clinic. Record the average of the two measurements. To check your blood pressure when you are not at a hospital or clinic, you can use:  An automated blood pressure machine at a pharmacy.  A home blood pressure monitor.  If you are between 60 years and 89 years old, ask your health care provider if you should take aspirin to prevent strokes.  Have regular diabetes screenings. This involves taking a blood sample to check your fasting blood sugar level.  If you are at a normal weight and have a low risk for diabetes, have this test once every three years after 79 years of age.  If you are overweight and have a high risk for diabetes, consider being tested at a younger age or more often. PREVENTING INFECTION  Hepatitis B  If you have a higher risk for hepatitis B, you should be screened for this virus. You are considered at high risk for hepatitis B if:  You were born in a country where hepatitis B is common. Ask your health care provider which countries are considered high risk.  Your parents were born in a high-risk country, and you have not been immunized against hepatitis B (hepatitis B vaccine).  You have HIV or AIDS.  You use needles to inject street drugs.  You live with someone who has hepatitis B.  You have had sex with someone who has hepatitis B.  You get hemodialysis treatment.  You take certain medicines for conditions, including cancer, organ transplantation, and autoimmune conditions. Hepatitis C  Blood testing is recommended for:  Everyone born from [birth year] through 1963.  Anyone with known risk factors for hepatitis C. Sexually transmitted infections (STIs)  You should be screened for sexually transmitted infections (STIs)  including gonorrhea and chlamydia if:  You are sexually active and are younger than 79 years of age.  You are older than 79 years of age and your health care provider tells you that you are at risk for this type of infection.  Your sexual activity has changed since you were last screened and you are at an increased risk for chlamydia or gonorrhea. Ask your health care provider if you are at risk.  If you do not have HIV, but are at risk, it may be recommended that you take  a prescription medicine daily to prevent HIV infection. This is called pre-exposure prophylaxis (PrEP). You are considered at risk if:  You are sexually active and do not regularly use condoms or know the HIV status of your partner(s).  You take drugs by injection.  You are sexually active with a partner who has HIV. Talk with your health care provider about whether you are at high risk of being infected with HIV. If you choose to begin PrEP, you should first be tested for HIV. You should then be tested every 3 months for as long as you are taking PrEP.  PREGNANCY   If you are premenopausal and you may become pregnant, ask your health care provider about preconception counseling.  If you may become pregnant, take 400 to 800 micrograms (mcg) of folic acid every day.  If you want to prevent pregnancy, talk to your health care provider about birth control (contraception). OSTEOPOROSIS AND MENOPAUSE   Osteoporosis is a disease in which the bones lose minerals and strength with aging. This can result in serious bone fractures. Your risk for osteoporosis can be identified using a bone density scan.  If you are 78 years of age or older, or if you are at risk for osteoporosis and fractures, ask your health care provider if you should be screened.  Ask your health care provider whether you should take a calcium or vitamin D supplement to lower your risk for osteoporosis.  Menopause may have certain physical symptoms and  risks.  Hormone replacement therapy may reduce some of these symptoms and risks. Talk to your health care provider about whether hormone replacement therapy is right for you.  HOME CARE INSTRUCTIONS   Schedule regular health, dental, and eye exams.  Stay current with your immunizations.   Do not use any tobacco products including cigarettes, chewing tobacco, or electronic cigarettes.  If you are pregnant, do not drink alcohol.  If you are breastfeeding, limit how much and how often you drink alcohol.  Limit alcohol intake to no more than 1 drink per day for nonpregnant women. One drink equals 12 ounces of beer, 5 ounces of wine, or 1 ounces of hard liquor.  Do not use street drugs.  Do not share needles.  Ask your health care provider for help if you need support or information about quitting drugs.  Tell your health care provider if you often feel depressed.  Tell your health care provider if you have ever been abused or do not feel safe at home.   This information is not intended to replace advice given to you by your health care provider. Make sure you discuss any questions you have with your health care provider.   Document Released: 10/20/2010 Document Revised: 04/27/2014 Document Reviewed: 03/08/2013 Elsevier Interactive Patient Education [employer].

## 2015-04-02 NOTE — Progress Notes (Signed)
Pre visit review using our clinic review tool, if applicable. No additional management support is needed unless otherwise documented below in the visit note. 

## 2015-04-02 NOTE — Assessment & Plan Note (Signed)
Chronic pain symptoms including leg pain, reviewed Following with [facility], ESI as needed (last done in 02/2015) and continue gapa + NSAIDs Increase gapa to TID today

## 2015-04-02 NOTE — Assessment & Plan Note (Signed)
Previously on metformin - stopped summer 2016 due to well controlled cbgs The current medical regimen (diet alone) appears effective;  continue present plan and medications. Check a1c q80mo -adjust as needed, esp with weight gain Lab Results  Component Value Date   HGBA1C 5.7 11/26/2014

## 2015-04-02 NOTE — Assessment & Plan Note (Signed)
Last lipids reviewed - at goal - check annually continue Lipitor as ongoing (hx CAD/CABG)

## 2015-04-09 DIAGNOSIS — M545 Low back pain: Secondary | ICD-10-CM | POA: Diagnosis not present

## 2015-04-09 DIAGNOSIS — M7061 Trochanteric bursitis, right hip: Secondary | ICD-10-CM | POA: Diagnosis not present

## 2015-05-02 ENCOUNTER — Ambulatory Visit (INDEPENDENT_AMBULATORY_CARE_PROVIDER_SITE_OTHER): Payer: Medicare Other | Admitting: Family

## 2015-05-02 ENCOUNTER — Encounter: Payer: Self-pay | Admitting: Family

## 2015-05-02 VITALS — BP 128/64 | HR 63 | Temp 97.9°F | Resp 14 | Ht 59.0 in | Wt 157.6 lb

## 2015-05-02 DIAGNOSIS — R059 Cough, unspecified: Secondary | ICD-10-CM

## 2015-05-02 DIAGNOSIS — R05 Cough: Secondary | ICD-10-CM | POA: Diagnosis not present

## 2015-05-02 MED ORDER — ALBUTEROL SULFATE (2.5 MG/3ML) 0.083% IN NEBU
5.0000 mg | INHALATION_SOLUTION | Freq: Once | RESPIRATORY_TRACT | Status: AC
  Administered 2015-05-02: 5 mg via RESPIRATORY_TRACT

## 2015-05-02 MED ORDER — AZITHROMYCIN 250 MG PO TABS: ORAL_TABLET | ORAL | Status: DC

## 2015-05-02 MED ORDER — ALBUTEROL SULFATE HFA 108 (90 BASE) MCG/ACT IN AERS: 1.0000 | INHALATION_SPRAY | RESPIRATORY_TRACT | Status: DC | PRN

## 2015-05-02 MED ORDER — FLUTICASONE-SALMETEROL 100-50 MCG/DOSE IN AEPB: 1.0000 | INHALATION_SPRAY | Freq: Two times a day (BID) | RESPIRATORY_TRACT | Status: DC

## 2015-05-02 NOTE — Progress Notes (Signed)
Subjective:    Patient ID: [name], female    DOB: 25-May-1930, 82 y.o.   MRN: [id number]  Chief Complaint  Patient presents with  . Cough    has congestion and coughing a bunch of mucus up    HPI:  [name] is a 82 y.o. 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 and presents today for an acute office visit.   Associated symptom of congestion, wheezing and coughing has been going on for over a year. Denies fevers. Cough is described as productive with white sputum.  Modifying factors include robutussion which has not helped very much. She has also been treated with Singulair and prednisone which have helped in the past. Denies recent antibiotic use.   No Known Allergies   Current Outpatient Prescriptions on File Prior to Visit  Medication Sig Dispense Refill  . amLODipine (NORVASC) 5 MG tablet Take 1 tablet (5 mg total) by mouth 2 (two) times daily. 180 tablet 3  . aspirin EC 81 MG tablet Take 81 mg by mouth daily.    Marland Kitchen atorvastatin (LIPITOR) 40 MG tablet Take 1 tablet (40 mg total) by mouth daily. 90 tablet 3  . carvedilol (COREG) 6.25 MG tablet Take 1 tablet (6.25 mg total) by mouth 2 (two) times daily with a meal. 180 tablet 3  . cetirizine (ZYRTEC) 10 MG tablet Take 1 tablet (10 mg total) by mouth daily. 30 tablet 11  . Cyanocobalamin (VITAMIN B-12 PO) Take 1 tablet by mouth daily with breakfast.     . DULoxetine (CYMBALTA) 30 MG capsule Take 1 capsule (30 mg total) by mouth daily. 30 capsule 3  . gabapentin (NEURONTIN) 300 MG capsule Take 1 capsule (300 mg total) by mouth 3 (three) times daily. 90 capsule 3  . meloxicam (MOBIC) 7.5 MG tablet Take 1 tablet (7.5 mg total) by mouth daily. 90 tablet 3    . omeprazole (PRILOSEC) 40 MG capsule Take 1 capsule (40 mg total) by mouth daily. 90 capsule 3  . OVER THE COUNTER MEDICATION Take 2 tablets by mouth daily as needed. "Dietary Supplement"    . polyethylene glycol (MIRALAX / GLYCOLAX) packet Take 17 g by mouth 2 (two) times daily. 14 each 0  . ramipril (ALTACE) 10 MG capsule Take 1 capsule (10 mg total) by mouth daily. 90 capsule 3  . senna (SENOKOT) 8.6 MG TABS Take 2 tablets by mouth as needed. For constipation    . VITAMIN E PO Take 1 capsule by mouth daily with breakfast.      No current facility-administered medications on file prior to visit.    Review of Systems  Constitutional: Negative for fever and chills.  HENT: Positive for congestion. Negative for sinus pressure, sore throat and voice change.   Respiratory: Positive for cough and wheezing. Negative for chest tightness.   Neurological: Negative for headaches.      Objective:    BP 128/64 mmHg  Pulse 63  Temp(Src) 97.9 F (36.6 C) (Oral)  Resp 14  Ht 4\' 11"  (1.499 m)  Wt 157 lb 9.6 oz (71.487 kg)  BMI 31.81 kg/m2  SpO2 98% Nursing note and vital signs reviewed.  Physical Exam  Constitutional: She is oriented to person, place, and time. She appears well-developed and well-nourished. No distress.  HENT:  Right Ear: Hearing, tympanic membrane, external ear and ear canal normal.  Left Ear: Hearing, tympanic membrane, external ear and ear canal normal.  Nose: Nose normal. Right sinus exhibits no maxillary sinus tenderness and no frontal sinus tenderness. Left sinus exhibits no maxillary sinus tenderness and no frontal sinus tenderness.  Mouth/Throat: Uvula is midline, oropharynx is clear and moist and mucous membranes are normal.  Cardiovascular: Normal rate, regular rhythm, normal heart sounds and intact distal pulses.   Pulmonary/Chest: Effort normal. She has wheezes.  Neurological: She is alert and oriented to person, place, and time.  Skin: Skin is warm and dry.   Psychiatric: She has a normal mood and affect. Her behavior is normal. Judgment and thought content normal.       Assessment & Plan:   Problem List Items Addressed This Visit      Other   Cough - Primary    Symptoms of cough and wheezing with concern for underlying obstructive/restrictive  pulmonary disease. In office albuterol treatment provided and tolerated well with significant improvement in wheezing. Start Azithromycin. Refer to pulmonology for pulmonary function tests. Start albuterol. Follow-up pending results of pulmonary function tests.      Relevant Medications   albuterol (PROVENTIL) (2.5 MG/3ML) 0.083% nebulizer solution 5 mg (Completed)   azithromycin (ZITHROMAX) 250 MG tablet   albuterol (PROVENTIL HFA;VENTOLIN HFA) 108 (90 Base) MCG/ACT inhaler   Fluticasone-Salmeterol (ADVAIR) 100-50 MCG/DOSE AEPB   Other Relevant Orders   Pulmonary function test

## 2015-05-02 NOTE — Assessment & Plan Note (Addendum)
Symptoms of cough and wheezing with concern for underlying obstructive/restrictive  pulmonary disease. In office albuterol treatment provided and tolerated well with significant improvement in wheezing. Start Azithromycin. Refer to pulmonology for pulmonary function tests. Start albuterol. Follow-up pending results of pulmonary function tests.

## 2015-05-02 NOTE — Patient Instructions (Addendum)
Thank you for choosing [facility].  Summary/Instructions:  Your prescription(s) have been submitted to your pharmacy or been printed and provided for you. Please take as directed and contact our office if you believe you are having problem(s) with the medication(s) or have any questions.  If your symptoms worsen or fail to improve, please contact our office for further instruction, or in case of emergency go directly to the emergency room at the closest medical facility.     

## 2015-05-02 NOTE — Progress Notes (Signed)
Pre visit review using our clinic review tool, if applicable. No additional management support is needed unless otherwise documented below in the visit note. 

## 2015-05-14 DIAGNOSIS — M7062 Trochanteric bursitis, left hip: Secondary | ICD-10-CM | POA: Diagnosis not present

## 2015-05-14 DIAGNOSIS — M7061 Trochanteric bursitis, right hip: Secondary | ICD-10-CM | POA: Diagnosis not present

## 2015-05-22 DIAGNOSIS — M150 Primary generalized (osteo)arthritis: Secondary | ICD-10-CM | POA: Diagnosis not present

## 2015-05-22 DIAGNOSIS — Z79899 Other long term (current) drug therapy: Secondary | ICD-10-CM | POA: Diagnosis not present

## 2015-05-22 DIAGNOSIS — M1009 Idiopathic gout, multiple sites: Secondary | ICD-10-CM | POA: Diagnosis not present

## 2015-05-22 DIAGNOSIS — M7061 Trochanteric bursitis, right hip: Secondary | ICD-10-CM | POA: Diagnosis not present

## 2015-05-22 DIAGNOSIS — M4806 Spinal stenosis, lumbar region: Secondary | ICD-10-CM | POA: Diagnosis not present

## 2015-05-23 ENCOUNTER — Other Ambulatory Visit: Payer: Self-pay | Admitting: Internal Medicine

## 2015-06-08 IMAGING — CR DG ABDOMEN 1V
1 series · 1 of 1 positions shown · non-contrast
Comparison: None.

CLINICAL DATA: Constipation.

EXAM:
ABDOMEN - 1 VIEW

[t abdomen supine]
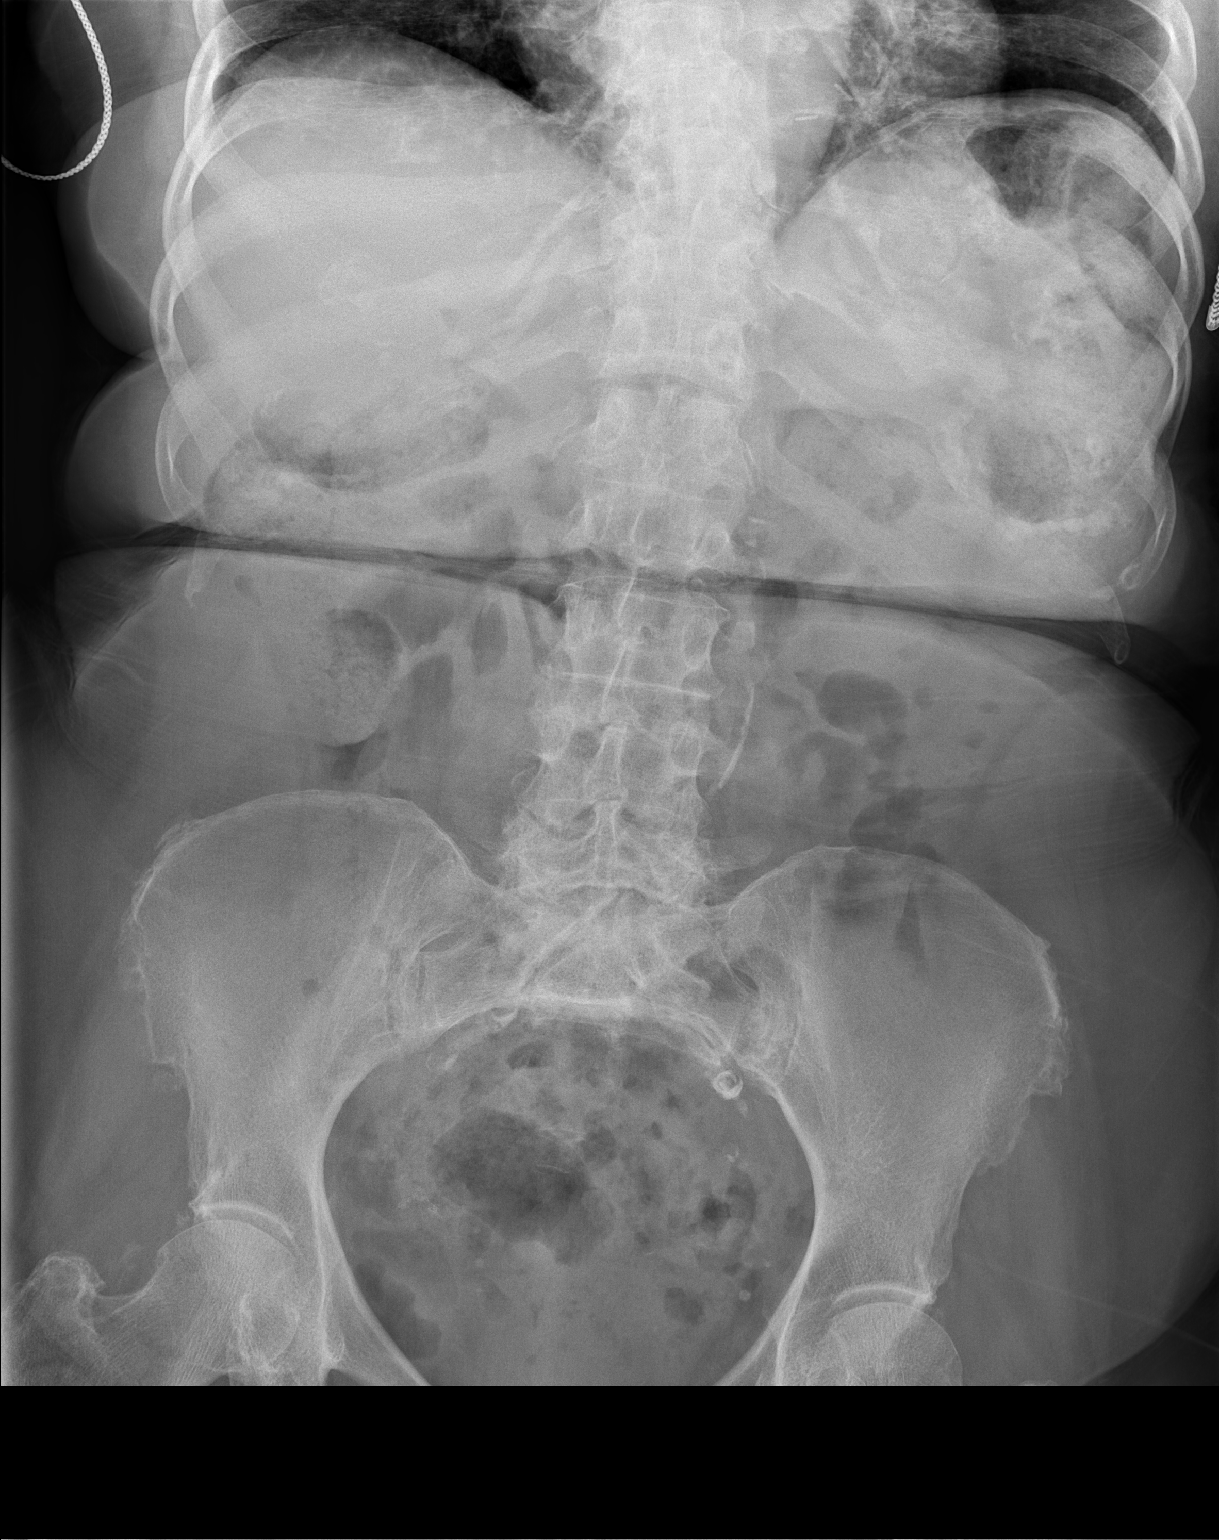

[1 of 1 positions shown; findings below may reference images not displayed]

FINDINGS: Moderate stool volume, with stool predominantly distributed between
the hepatic and splenic flexures. No evidence of bowel obstruction.
No concerning intra-abdominal mass effect. Diffuse atherosclerotic
calcification.
IMPRESSION: 1. Nonobstructive bowel gas pattern.
2. Moderate stool volume.  No rectal impaction.

## 2015-06-10 ENCOUNTER — Telehealth: Payer: Self-pay | Admitting: Internal Medicine

## 2015-06-10 NOTE — Telephone Encounter (Signed)
Pt has spinal stenosis and she is having a hard time with the pain and she is requesting for a back brace and a right knee brace and a cream for her back. I believe [employer] [phone] would be for the cream and the braces are with [employer] [phone].  Her appointment with Dr. Quay Burow isn't until 3/3 and she's not sure she can wait that long. Is there anything that can be done to help her out.

## 2015-06-11 NOTE — Telephone Encounter (Signed)
I would recommend an acute visit with Dr. Quay Burow or another provider for those sx. I don't know that [facility] would have the braces that she needs. (only speaking from experience). Insurance will not pay for the braces without OV notes. Forwarding message to Dr. Quay Burow for her advise.

## 2015-06-11 NOTE — Telephone Encounter (Signed)
I got her in on Friday with Dr. Jenny Reichmann

## 2015-06-11 NOTE — Telephone Encounter (Signed)
She really needs to be seen

## 2015-06-12 ENCOUNTER — Ambulatory Visit: Payer: Medicare Other | Admitting: Podiatry

## 2015-06-12 DIAGNOSIS — M4806 Spinal stenosis, lumbar region: Secondary | ICD-10-CM | POA: Diagnosis not present

## 2015-06-14 ENCOUNTER — Encounter: Payer: Self-pay | Admitting: Internal Medicine

## 2015-06-14 ENCOUNTER — Ambulatory Visit (INDEPENDENT_AMBULATORY_CARE_PROVIDER_SITE_OTHER): Payer: Medicare Other | Admitting: Internal Medicine

## 2015-06-14 VITALS — BP 132/70 | HR 78 | Temp 98.6°F | Ht 59.0 in | Wt 156.0 lb

## 2015-06-14 DIAGNOSIS — M4806 Spinal stenosis, lumbar region: Secondary | ICD-10-CM | POA: Diagnosis not present

## 2015-06-14 DIAGNOSIS — E0841 Diabetes mellitus due to underlying condition with diabetic mononeuropathy: Secondary | ICD-10-CM

## 2015-06-14 DIAGNOSIS — M48061 Spinal stenosis, lumbar region without neurogenic claudication: Secondary | ICD-10-CM

## 2015-06-14 DIAGNOSIS — M25561 Pain in right knee: Secondary | ICD-10-CM | POA: Diagnosis not present

## 2015-06-14 MED ORDER — GABAPENTIN 300 MG PO CAPS: 300.0000 mg | ORAL_CAPSULE | Freq: Every day | ORAL | Status: DC

## 2015-06-14 MED ORDER — GABAPENTIN 100 MG PO CAPS: 100.0000 mg | ORAL_CAPSULE | Freq: Two times a day (BID) | ORAL | Status: DC

## 2015-06-14 MED ORDER — LIDOCAINE 5 % EX PTCH: 1.0000 | MEDICATED_PATCH | CUTANEOUS | Status: DC

## 2015-06-14 NOTE — Assessment & Plan Note (Signed)
Mild, infrequent recurrent, not clear to me knee brace will help, d/w pt that this is not my speciality, suggested to see Dr Smith/sport med for this or orthopedic and she will consider

## 2015-06-14 NOTE — Patient Instructions (Signed)
OK to change the gabapentin to 100 mg twice during the day, and 600 mg at night  OK to try the Lidoderm patch as well  Please continue all other medications as before, and refills have been done if requested.  Please have the pharmacy call with any other refills you may need.  Please keep your appointments with your specialists as you may have planned

## 2015-06-14 NOTE — Assessment & Plan Note (Signed)
Pt state neuropathy as reason for back brace and right knee brace as well,  In addition to her advanced age; again I suggested the bracing would not help this

## 2015-06-14 NOTE — Progress Notes (Signed)
Subjective:    Patient ID: [name], female    DOB: 1931-02-18, 82 y.o.   MRN: [id number]  HPI  Here with long hx of lumbar spinal stenosis s/p surgury, right hip bursitis, and s/p right knee TKR in [place] years ago. Has seen Dr Marchelle Gearing for 8 yrs for the back and hip.  Has ongoing chronic persistent  pain, not particularly worse but chronic moderate despite current tx and s/p ESI x 3 per ortho as well to lower back.  Does not use back brace or knee brace in past.   Husband is disabled veteran, she is member of [employer].  They called her and suggested they would be able to provide her with both, so she is here hoping I will authorize this by phone.  Pt continues to have recurring LBP without change in severity, bowel or bladder change, fever, wt loss,  worsening LE pain/numbness/weakness, gait change or falls.  No knee giveaways or tendency to fall.  Has long hx of FMS and anxiety as well.  Does not take the 300 mg gabapentin during the day as it makes her sleepy.   Past Medical History  Diagnosis Date  . S/P CABG x 1   . S/P inguinal hernia repair   . TUBULOVILLOUS ADENOMA, 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    colo 04/2012 - no polyps - no further screening planned (age >70)  . CAD, NATIVE VESSEL 06/09/2008  . UNSPECIFIED ANEMIA   . DIABETES MELLITUS, TYPE II   . GERD   . HYPERLIPIDEMIA   . HYPERTENSION   . HIATAL HERNIA   . FIBROMYALGIA   . OA (osteoarthritis)     severe, shoulders, hands - inflammatory, ?RA  . Gout   . Insomnia    Past Surgical History  Procedure Laterality Date  . Hernia repair    . Abdominal hysterectomy  1981  . Coronary artery bypass graft  2010  . Appendectomy    . Replacement total knee bilateral Bilateral     Rt-1974, Lt -1989  . Rotator cuff repair      Left  . Ovarian cyst removal    . Cataract extraction, bilateral    . Reverse shoulder arthroplasty  09/08/2011    Procedure: REVERSE SHOULDER ARTHROPLASTY;  Surgeon: Nita Sells, MD;  Location: [facility];  Service: Orthopedics;  Laterality: Left;  left reverse total shoulder arthroplasty  . Joint replacement      reports that she quit smoking about 45 years ago. She has never used smokeless tobacco. She reports that she drinks about 0.6 oz of alcohol per week. She reports that she does not use illicit drugs. family history includes Cancer in her brother and mother; Diabetes in her brother, daughter, father, and sister; Heart attack in her brother and father; Heart disease in her sister; Heart disease (age of onset: 44) in her father; Hypertension in her daughter and mother; Stomach cancer (age of onset: 34) in her mother. There is no history of Anesthesia problems or Colon cancer. No Known Allergies Current Outpatient Prescriptions on File Prior to Visit  Medication Sig Dispense Refill  . albuterol (PROVENTIL HFA;VENTOLIN HFA) 108 (90 Base) MCG/ACT inhaler Inhale 1-2 puffs into the lungs every 4 (four) hours as needed for wheezing or shortness of breath. 1 Inhaler 3  . amLODipine (NORVASC) 5 MG tablet Take 1 tablet (5 mg total) by mouth 2 (two) times daily. 180 tablet 3  . aspirin EC 81 MG tablet Take 81 mg by mouth daily.    Marland Kitchen  atorvastatin (LIPITOR) 40 MG tablet Take 1 tablet (40 mg total) by mouth daily. 90 tablet 3  . carvedilol (COREG) 6.25 MG tablet Take 1 tablet (6.25 mg total) by mouth 2 (two) times daily with a meal. 180 tablet 3  . cetirizine (ZYRTEC) 10 MG tablet Take 1 tablet (10 mg total) by mouth daily. 30 tablet 11  . Cyanocobalamin (VITAMIN B-12 PO) Take 1 tablet by mouth daily with breakfast.     . DULoxetine (CYMBALTA) 30 MG capsule Take 1 capsule (30 mg total) by mouth daily. 30 capsule 3  . Fluticasone-Salmeterol (ADVAIR) 100-50 MCG/DOSE AEPB Inhale 1 puff into the lungs 2 (two) times daily. 1 each 3  . meloxicam (MOBIC) 7.5 MG tablet Take 1 tablet (7.5 mg total) by mouth daily. 90 tablet 3  . omeprazole (PRILOSEC) 40 MG capsule Take 1 capsule (40 mg total) by mouth daily. 90  capsule 3  . OVER THE COUNTER MEDICATION Take 2 tablets by mouth daily as needed. "Dietary Supplement"    . polyethylene glycol (MIRALAX / GLYCOLAX) packet Take 17 g by mouth 2 (two) times daily. 14 each 0  . ramipril (ALTACE) 10 MG capsule Take 1 capsule (10 mg total) by mouth daily. 90 capsule 3  . senna (SENOKOT) 8.6 MG TABS Take 2 tablets by mouth as needed. For constipation    . VITAMIN E PO Take 1 capsule by mouth daily with breakfast.     . azithromycin (ZITHROMAX) 250 MG tablet Take 2 tablets by mouth for 1 day and then 1 tablet by mouth daily for 4 days. (Patient not taking: Reported on 06/14/2015) 6 tablet 0  . cetirizine (ZYRTEC) 10 MG tablet TAKE 1 TABLET (10 MG TOTAL) BY MOUTH DAILY. (Patient not taking: Reported on 06/14/2015) 30 tablet 11   No current facility-administered medications on file prior to visit.   Review of Systems  Constitutional: Negative for unusual diaphoresis or night sweats HENT: Negative for ringing in ear or discharge Eyes: Negative for double vision or worsening visual disturbance.  Respiratory: Negative for choking and stridor.   Gastrointestinal: Negative for vomiting or other signifcant bowel change Genitourinary: Negative for hematuria or change in urine volume.  Musculoskeletal: Negative for other MSK pain or swelling Skin: Negative for color change and worsening wound.  Neurological: Negative for tremors and numbness other than noted  Psychiatric/Behavioral: Negative for decreased concentration or agitation other than above       Objective:   Physical Exam BP 132/70 mmHg  Pulse 78  Temp(Src) 98.6 F (37 C) (Oral)  Ht 4\' 11"  (1.499 m)  Wt 156 lb (70.761 kg)  BMI 31.49 kg/m2  SpO2 97% VS noted,  Bright, elderly, does not appear in pain however, some stiff to get up from sitting Constitutional: Pt appears in no significant distress HENT: Head: NCAT.  Right Ear: External ear normal.  Left Ear: External ear normal.  Eyes: . Pupils are equal,  round, and reactive to light. Conjunctivae and EOM are normal Neck: Normal range of motion. Neck supple.  Cardiovascular: Normal rate and regular rhythm.   Pulmonary/Chest: Effort normal and breath sounds without rales or wheezing.  Abd:  Soft, NT, ND, + BS Spine: mild diffuse tender lower lumbar area Right knee s/p TKR, without effusion, NT, does not seem unstable Neurological: Pt is alert. Not confused , motor 5/5 intact Skin: Skin is warm. No rash, no LE edema Psychiatric: Pt behavior is normal. No agitation.     Assessment & Plan:

## 2015-06-14 NOTE — Progress Notes (Signed)
Pre visit review using our clinic review tool, if applicable. No additional management support is needed unless otherwise documented below in the visit note. 

## 2015-06-14 NOTE — Assessment & Plan Note (Signed)
Pt appears a bit desparate and wants to try anything, especially if free, but I am not convinced a back brace would help, and this was dw pt as this is not my speciality.  Will adjust her gabapentin to 100 bid, then 600 qhs as much pain is at night with lying down, also lidoderm patch prn,  to f/u any worsening symptoms or concerns, may wish to f/u with Dr Berenice Primas regarding this as well

## 2015-06-21 ENCOUNTER — Encounter: Payer: Self-pay | Admitting: Internal Medicine

## 2015-06-21 ENCOUNTER — Ambulatory Visit (INDEPENDENT_AMBULATORY_CARE_PROVIDER_SITE_OTHER): Payer: Medicare Other | Admitting: Internal Medicine

## 2015-06-21 VITALS — BP 146/84 | HR 63 | Temp 98.0°F | Resp 16

## 2015-06-21 DIAGNOSIS — I10 Essential (primary) hypertension: Secondary | ICD-10-CM | POA: Diagnosis not present

## 2015-06-21 DIAGNOSIS — E1140 Type 2 diabetes mellitus with diabetic neuropathy, unspecified: Secondary | ICD-10-CM | POA: Diagnosis not present

## 2015-06-21 DIAGNOSIS — E785 Hyperlipidemia, unspecified: Secondary | ICD-10-CM

## 2015-06-21 DIAGNOSIS — K5900 Constipation, unspecified: Secondary | ICD-10-CM | POA: Diagnosis not present

## 2015-06-21 DIAGNOSIS — M48061 Spinal stenosis, lumbar region without neurogenic claudication: Secondary | ICD-10-CM

## 2015-06-21 DIAGNOSIS — K219 Gastro-esophageal reflux disease without esophagitis: Secondary | ICD-10-CM

## 2015-06-21 DIAGNOSIS — M4806 Spinal stenosis, lumbar region: Secondary | ICD-10-CM

## 2015-06-21 MED ORDER — DOCUSATE SODIUM 100 MG PO CAPS: 200.0000 mg | ORAL_CAPSULE | Freq: Every day | ORAL | Status: DC

## 2015-06-21 NOTE — Progress Notes (Signed)
Pre visit review using our clinic review tool, if applicable. No additional management support is needed unless otherwise documented below in the visit note. 

## 2015-06-21 NOTE — Progress Notes (Signed)
Subjective:    Patient ID: [name], female    DOB: Nov 20, 1930, 82 y.o.   MRN: [id number]  HPI  She is here for follow up.  Hypertension: She is taking her medication daily. She is compliant with a low sodium diet.  She denies chest pain, palpitations, edema, shortness of breath and regular headaches, but does have occasional headaches. She is not exercising regularly.  She does not monitor her blood pressure at home.    GERD:  She is taking her medication daily as prescribed.  She denies any GERD symptoms and feels her GERD is well controlled.   Hyperlipidemia: She is taking her medication daily. She is compliant with a low fat/cholesterol diet. She is not exercising regularly due to her back pain. She denies myalgias.   Spinal stenosis:  She gets the injections.  She is not going to have surgery, but her and the surgeon agree she should not have surgery.  She is taking Gabapentin at night and during the day.  She is unsure how much it helps, but it does help her sleep at night.    Constipation:  She has had it since she was young.  She drinks prune juice and eats vegetables.  She thinks she needs a laxative or something daily.  She has abdominal discomfort and increased back pain with consitpation.  She has taken miralax, sennokot without improvement.    Diabetes: She is controlling her diabetes without medication. She is compliant with a diabetic diet. She is not exercising regularly.   Medications and allergies reviewed with patient and updated if appropriate.  Patient Active Problem List   Diagnosis Date Noted  . Right knee pain 06/14/2015  . Cough 05/02/2015  . Diabetic neuropathy ([place]) 03/21/2014  . Bunion 03/21/2014  . PVD (pulmonary valve disease) 01/23/2014  . Onychomycosis 12/20/2013  . PVD (peripheral vascular disease) ([place]) 12/19/2013  . Gout 12/13/2013  . Depression   . Spinal stenosis of lumbar region 04/24/2013  . Sciatica 09/05/2012  . Palpitations  07/06/2012  . Arthritis of shoulder region, left 09/11/2011  . Memory loss 07/23/2010  . Type 2 diabetes mellitus ([place]) 01/17/2009  . Anemia 10/15/2008  . CAD, NATIVE VESSEL 06/09/2008  . Hyperlipidemia 06/15/2007  . Essential hypertension 06/15/2007  . GERD 06/15/2007  . Fibromyalgia syndrome 06/15/2007  . 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 DIVERTICULOSIS, 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    Current Outpatient Prescriptions on File Prior to Visit  Medication Sig Dispense Refill  . amLODipine (NORVASC) 5 MG tablet Take 1 tablet (5 mg total) by mouth 2 (two) times daily. 180 tablet 3  . aspirin EC 81 MG tablet Take 81 mg by mouth daily.    Marland Kitchen atorvastatin (LIPITOR) 40 MG tablet Take 1 tablet (40 mg total) by mouth daily. 90 tablet 3  . carvedilol (COREG) 6.25 MG tablet Take 1 tablet (6.25 mg total) by mouth 2 (two) times daily with a meal. 180 tablet 3  . cetirizine (ZYRTEC) 10 MG tablet Take 1 tablet (10 mg total) by mouth daily. 30 tablet 11  . Cyanocobalamin (VITAMIN B-12 PO) Take 1 tablet by mouth daily with breakfast.     . DULoxetine (CYMBALTA) 30 MG capsule Take 1 capsule (30 mg total) by mouth daily. 30 capsule 3  . gabapentin (NEURONTIN) 100 MG capsule Take 1 capsule (100 mg total) by mouth 2 (two) times daily. 180 capsule 1  . gabapentin (NEURONTIN) 300 MG capsule Take 1 capsule (300 mg total) by mouth  at bedtime. To take in addition to the daytime dosing 180 capsule 1  . meloxicam (MOBIC) 7.5 MG tablet Take 1 tablet (7.5 mg total) by mouth daily. 90 tablet 3  . omeprazole (PRILOSEC) 40 MG capsule Take 1 capsule (40 mg total) by mouth daily. 90 capsule 3  . OVER THE COUNTER MEDICATION Take 2 tablets by mouth daily as needed. "Dietary Supplement"    . polyethylene glycol (MIRALAX / GLYCOLAX) packet Take 17 g by mouth 2 (two) times daily. 14 each 0  . ramipril (ALTACE) 10 MG capsule Take 1 capsule (10 mg total) by mouth daily. 90 capsule 3  . VITAMIN E PO Take 1 capsule by mouth daily  with breakfast.     . lidocaine (LIDODERM) 5 % Place 1 patch onto the skin daily. Remove & Discard patch within 12 hours or as directed by MD (Patient not taking: Reported on 06/21/2015) 60 patch 0   No current facility-administered medications on file prior to visit.    Past Medical History  Diagnosis Date  . S/P CABG x 1   . S/P inguinal hernia repair   . TUBULOVILLOUS ADENOMA, 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    colo 04/2012 - no polyps - no further screening planned (age >61)  . CAD, NATIVE VESSEL 06/09/2008  . UNSPECIFIED ANEMIA   . DIABETES MELLITUS, TYPE II   . GERD   . HYPERLIPIDEMIA   . HYPERTENSION   . HIATAL HERNIA   . FIBROMYALGIA   . OA (osteoarthritis)     severe, shoulders, hands - inflammatory, ?RA  . Gout   . Insomnia     Past Surgical History  Procedure Laterality Date  . Hernia repair    . Abdominal hysterectomy  1981  . Coronary artery bypass graft  2010  . Appendectomy    . Replacement total knee bilateral Bilateral     Rt-1974, Lt -1989  . Rotator cuff repair      Left  . Ovarian cyst removal    . Cataract extraction, bilateral    . Reverse shoulder arthroplasty  09/08/2011    Procedure: REVERSE SHOULDER ARTHROPLASTY;  Surgeon: Nita Sells, MD;  Location: [facility];  Service: Orthopedics;  Laterality: Left;  left reverse total shoulder arthroplasty  . Joint replacement      Social History   Social History  . Marital Status: Widowed    Spouse Name: N/A  . Number of Children: N/A  . Years of Education: N/A   Social History Main Topics  . Smoking status: Former Smoker    Quit date: 04/26/1970  . Smokeless tobacco: Never Used  . Alcohol Use: 0.6 oz/week    1 Glasses of wine per week  . Drug Use: No  . Sexual Activity: Not Asked   Other Topics Concern  . 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 Cancer Mother     Stomach  . Stomach  cancer Mother 40  . Hypertension Mother   . Heart disease Father 28    MI  . Diabetes Father   . Heart attack Father   . Diabetes Sister   . Heart disease Sister   . Heart attack Brother   . Cancer Brother   . Diabetes Brother   . Anesthesia problems Neg Hx   . Colon cancer Neg Hx   . Diabetes Daughter   . Hypertension  Daughter     Review of Systems  Constitutional: Negative for fever and chills.  Respiratory: Negative for cough, shortness of breath and wheezing.   Cardiovascular: Negative for chest pain, palpitations and leg swelling.  Gastrointestinal: Positive for abdominal pain (with constipation) and constipation.       No GERD - controlled  Neurological: Positive for headaches. Negative for dizziness and light-headedness.       Objective:   Filed Vitals:   06/21/15 1534  BP: 146/84  Pulse: 63  Temp: 98 F (36.7 C)  Resp: 16   Filed Weights   There is no weight on file to calculate BMI.   Physical Exam Constitutional: Appears well-developed and well-nourished. No distress.  Neck: Neck supple. No tracheal deviation present. No thyromegaly present.  No carotid bruit. No cervical adenopathy.   Cardiovascular: Normal rate, regular rhythm and normal heart sounds.   No murmur heard.  No edema Pulmonary/Chest: Effort normal and breath sounds normal. No respiratory distress. No wheezes.       Assessment & Plan:    See Problem List for Assessment and Plan of chronic medical problems.  Follow up in 3 months

## 2015-06-21 NOTE — Assessment & Plan Note (Signed)
Well controlled with diet Check a1c every 6 months

## 2015-06-21 NOTE — Patient Instructions (Addendum)
  Medications reviewed and updated.  Changes include trying colace for your constipation - if it does not work please let me know.   Your prescription(s) have been submitted to your pharmacy. Please take as directed and contact our office if you believe you are having problem(s) with the medication(s).    Please schedule followup in 3 months

## 2015-06-21 NOTE — Assessment & Plan Note (Signed)
Not going to have surgery, chronic back pain Takes gabapentin three times daily

## 2015-06-21 NOTE — Assessment & Plan Note (Signed)
Controlled Continue omeprazole daily

## 2015-06-21 NOTE — Assessment & Plan Note (Signed)
On lipitor 40 mg daily Lipid panel controlled Recheck lipid panel in 6 months

## 2015-06-26 ENCOUNTER — Ambulatory Visit (INDEPENDENT_AMBULATORY_CARE_PROVIDER_SITE_OTHER): Payer: Medicare Other | Admitting: Podiatry

## 2015-06-26 ENCOUNTER — Encounter: Payer: Self-pay | Admitting: Podiatry

## 2015-06-26 VITALS — BP 154/75 | HR 73

## 2015-06-26 DIAGNOSIS — B351 Tinea unguium: Secondary | ICD-10-CM

## 2015-06-26 DIAGNOSIS — M79606 Pain in leg, unspecified: Secondary | ICD-10-CM

## 2015-06-26 NOTE — Patient Instructions (Signed)
Seen for hypertrophic nails. All nails debrided. Return in 3 months or as needed.  

## 2015-06-26 NOTE — Progress Notes (Signed)
Subjective: 82 year old female presents complaining of problematic toe nails.  Having pain from spinal stenosis and pain and swelling in right knee.   Objective: Dermatologic: Deformed toe nails 2-3 bilateral. Vascular: All pedal pulses are palpable. Right DP is weaker than the left. Neuro: Normal response to Monofilament testing on both feet. Subjective numbness and tingling on left foot.  Orthopedic: Severe bunion bilateral. Contracted lesser digits bilateral.  Assessment: Hallux valgus with bilateral bunion deformity. Severe pronation STJ bilateral. Tenosynovitis right lateral ankle.  Painful toe nails.   Plan: Reviewed findings and available options. Debrided all nails.

## 2015-06-27 NOTE — Telephone Encounter (Signed)
PLEASE CALL [facility] FOR PATIENTS BACK AND KNEE BRACE PLEASE CALL THEM AT  [phone]

## 2015-06-27 NOTE — Telephone Encounter (Signed)
REGARDING ENCOUNTER BELOW: PLEASE CALL THEM AT [phone] REF [phone]

## 2015-06-27 NOTE — Telephone Encounter (Signed)
[facility] informed that we have the forms for the back and knee brace and that pt has appt on 06/28/15 with provider in office.

## 2015-07-08 ENCOUNTER — Other Ambulatory Visit: Payer: Self-pay | Admitting: Emergency Medicine

## 2015-07-08 MED ORDER — OMEPRAZOLE 40 MG PO CPDR: 40.0000 mg | DELAYED_RELEASE_CAPSULE | Freq: Every day | ORAL | Status: DC

## 2015-07-11 DIAGNOSIS — M5416 Radiculopathy, lumbar region: Secondary | ICD-10-CM | POA: Diagnosis not present

## 2015-07-15 ENCOUNTER — Emergency Department (HOSPITAL_COMMUNITY)
Admission: EM | Admit: 2015-07-15 | Discharge: 2015-07-15 | Disposition: A | Discharge status: Home / Self Care | Payer: Medicare Other | Attending: Emergency Medicine | Admitting: Emergency Medicine

## 2015-07-15 ENCOUNTER — Emergency Department (HOSPITAL_COMMUNITY): Payer: Medicare Other

## 2015-07-15 DIAGNOSIS — I2510 Atherosclerotic heart disease of native coronary artery without angina pectoris: Secondary | ICD-10-CM | POA: Diagnosis not present

## 2015-07-15 DIAGNOSIS — Z951 Presence of aortocoronary bypass graft: Secondary | ICD-10-CM | POA: Insufficient documentation

## 2015-07-15 DIAGNOSIS — M1990 Unspecified osteoarthritis, unspecified site: Secondary | ICD-10-CM | POA: Diagnosis not present

## 2015-07-15 DIAGNOSIS — Z7982 Long term (current) use of aspirin: Secondary | ICD-10-CM | POA: Diagnosis not present

## 2015-07-15 DIAGNOSIS — M10071 Idiopathic gout, right ankle and foot: Secondary | ICD-10-CM | POA: Diagnosis not present

## 2015-07-15 DIAGNOSIS — G4700 Insomnia, unspecified: Secondary | ICD-10-CM | POA: Insufficient documentation

## 2015-07-15 DIAGNOSIS — Z87891 Personal history of nicotine dependence: Secondary | ICD-10-CM | POA: Diagnosis not present

## 2015-07-15 DIAGNOSIS — I10 Essential (primary) hypertension: Secondary | ICD-10-CM | POA: Insufficient documentation

## 2015-07-15 DIAGNOSIS — M797 Fibromyalgia: Secondary | ICD-10-CM | POA: Insufficient documentation

## 2015-07-15 DIAGNOSIS — M109 Gout, unspecified: Secondary | ICD-10-CM

## 2015-07-15 DIAGNOSIS — Z791 Long term (current) use of non-steroidal anti-inflammatories (NSAID): Secondary | ICD-10-CM | POA: Diagnosis not present

## 2015-07-15 DIAGNOSIS — D649 Anemia, unspecified: Secondary | ICD-10-CM | POA: Diagnosis not present

## 2015-07-15 DIAGNOSIS — K219 Gastro-esophageal reflux disease without esophagitis: Secondary | ICD-10-CM | POA: Insufficient documentation

## 2015-07-15 DIAGNOSIS — M25571 Pain in right ankle and joints of right foot: Secondary | ICD-10-CM | POA: Diagnosis not present

## 2015-07-15 DIAGNOSIS — Z86018 Personal history of other benign neoplasm: Secondary | ICD-10-CM | POA: Diagnosis not present

## 2015-07-15 DIAGNOSIS — M7989 Other specified soft tissue disorders: Secondary | ICD-10-CM | POA: Diagnosis not present

## 2015-07-15 DIAGNOSIS — Z79899 Other long term (current) drug therapy: Secondary | ICD-10-CM | POA: Diagnosis not present

## 2015-07-15 DIAGNOSIS — E119 Type 2 diabetes mellitus without complications: Secondary | ICD-10-CM | POA: Diagnosis not present

## 2015-07-15 DIAGNOSIS — E785 Hyperlipidemia, unspecified: Secondary | ICD-10-CM | POA: Insufficient documentation

## 2015-07-15 MED ORDER — PREDNISONE 20 MG PO TABS
20.0000 mg | ORAL_TABLET | Freq: Once | ORAL | Status: AC
  Administered 2015-07-15: 20 mg via ORAL
  Filled 2015-07-15: qty 1

## 2015-07-15 MED ORDER — HYDROCODONE-ACETAMINOPHEN 5-325 MG PO TABS: 1.0000 | ORAL_TABLET | ORAL | Status: DC | PRN

## 2015-07-15 MED ORDER — PREDNISONE 20 MG PO TABS: 40.0000 mg | ORAL_TABLET | Freq: Every day | ORAL | Status: DC

## 2015-07-15 NOTE — Discharge Instructions (Signed)

## 2015-07-15 NOTE — ED Notes (Signed)
Patient transported to X-ray 

## 2015-07-15 NOTE — ED Provider Notes (Signed)
CSN: [id number]     Arrival date & time 07/15/15  1507 History   First MD Initiated Contact with Patient 07/15/15 1851     Chief Complaint  Patient presents with  . Ankle Pain    HPI   82 year old female presents today with complaints of ankle pain. She reports symptoms started approximately one week ago with pain to the right posterior heel and lateral ankle. She reports this feels similar to her gout. Patient reports normally she gets gout in her right great toe, but occasionally has a normal ankle, and presented similarly. Patient reports the pain continues to worsen, has not spread. She reports pain with ambulation, is able to plantar flex and dorsiflex the ankle, but does have pain at the end range of motion. Patient denies any fever, chills, nausea, vomiting, or any other areas of concern. Patient reports that she has not taken any medication for this episode, and is uncertain what medication she is supposed 2 take for her gout. She does report a history that 2 diabetes, notes that this is well controlled without medications.   Past Medical History  Diagnosis Date  . S/P CABG x 1   . S/P inguinal hernia repair   . TUBULOVILLOUS ADENOMA, 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    colo 04/2012 - no polyps - no further screening planned (age >31)  . CAD, NATIVE VESSEL 06/09/2008  . UNSPECIFIED ANEMIA   . DIABETES MELLITUS, TYPE II   . GERD   . HYPERLIPIDEMIA   . HYPERTENSION   . HIATAL HERNIA   . FIBROMYALGIA   . OA (osteoarthritis)     severe, shoulders, hands - inflammatory, ?RA  . Gout   . Insomnia    Past Surgical History  Procedure Laterality Date  . Hernia repair    . Abdominal hysterectomy  1981  . Coronary artery bypass graft  2010  . Appendectomy    . Replacement total knee bilateral Bilateral     Rt-1974, Lt -1989  . Rotator cuff repair      Left  . Ovarian cyst removal    . Cataract extraction, bilateral    . Reverse shoulder arthroplasty  09/08/2011    Procedure: REVERSE SHOULDER  ARTHROPLASTY;  Surgeon: Nita Sells, MD;  Location: [place];  Service: Orthopedics;  Laterality: Left;  left reverse total shoulder arthroplasty  . Joint replacement     Family History  Problem Relation Age of Onset  . Cancer Mother     Stomach  . Stomach cancer Mother 65  . Hypertension Mother   . Heart disease Father 50    MI  . Diabetes Father   . Heart attack Father   . Diabetes Sister   . Heart disease Sister   . Heart attack Brother   . Cancer Brother   . Diabetes Brother   . Anesthesia problems Neg Hx   . Colon cancer Neg Hx   . Diabetes Daughter   . Hypertension Daughter    Social History  Substance Use Topics  . Smoking status: Former Smoker    Quit date: 04/26/1970  . Smokeless tobacco: Never Used  . Alcohol Use: 0.6 oz/week    1 Glasses of wine per week   OB History    No data available     Review of Systems  All other systems reviewed and are negative.  Allergies  Review of patient's allergies indicates no known allergies.  Home Medications   Prior to Admission medications   Medication Sig Start  Date End Date Taking? Authorizing Provider  amLODipine (NORVASC) 5 MG tablet Take 1 tablet (5 mg total) by mouth 2 (two) times daily. 03/18/15  Yes Fay Records, MD  aspirin EC 81 MG tablet Take 81 mg by mouth daily.   Yes Historical Provider, MD  atorvastatin (LIPITOR) 40 MG tablet Take 1 tablet (40 mg total) by mouth daily. 03/18/15  Yes Fay Records, MD  carvedilol (COREG) 6.25 MG tablet Take 1 tablet (6.25 mg total) by mouth 2 (two) times daily with a meal. 04/02/15  Yes Rowe Clack, MD  cetirizine (ZYRTEC) 10 MG tablet Take 1 tablet (10 mg total) by mouth daily. 04/02/15  Yes Rowe Clack, MD  Cyanocobalamin (VITAMIN B-12 PO) Take 1 tablet by mouth daily with breakfast.    Yes Historical Provider, MD  docusate sodium (COLACE) 100 MG capsule Take 2 capsules (200 mg total) by mouth daily. Patient taking differently: Take 100 mg by mouth  2 (two) times daily.  06/21/15  Yes Binnie Rail, MD  DULoxetine (CYMBALTA) 30 MG capsule Take 1 capsule (30 mg total) by mouth daily. 04/02/15  Yes Rowe Clack, MD  gabapentin (NEURONTIN) 100 MG capsule Take 1 capsule (100 mg total) by mouth 2 (two) times daily. Patient taking differently: Take 100 mg by mouth daily.  06/14/15  Yes Biagio Borg, MD  gabapentin (NEURONTIN) 300 MG capsule Take 1 capsule (300 mg total) by mouth at bedtime. To take in addition to the daytime dosing Patient taking differently: Take 600 mg by mouth at bedtime. To take in addition to the daytime dosing 06/14/15  Yes Biagio Borg, MD  meloxicam (MOBIC) 7.5 MG tablet Take 1 tablet (7.5 mg total) by mouth daily. 04/02/15  Yes Rowe Clack, MD  omeprazole (PRILOSEC) 40 MG capsule Take 1 capsule (40 mg total) by mouth daily. 07/08/15  Yes Binnie Rail, MD  ramipril (ALTACE) 10 MG capsule Take 1 capsule (10 mg total) by mouth daily. 04/02/15  Yes Rowe Clack, MD  HYDROcodone-acetaminophen (NORCO/VICODIN) 5-325 MG tablet Take 1 tablet by mouth every 4 (four) hours as needed. 07/15/15   Okey Regal, PA-C  lidocaine (LIDODERM) 5 % Place 1 patch onto the skin daily. Remove & Discard patch within 12 hours or as directed by MD Patient not taking: Reported on 07/15/2015 06/14/15   Biagio Borg, MD  predniSONE (DELTASONE) 20 MG tablet Take 2 tablets (40 mg total) by mouth daily with breakfast. 07/15/15   Okey Regal, PA-C   BP 156/84 mmHg  Pulse 89  Temp(Src) 98.4 F (36.9 C) (Oral)  Resp 20  SpO2 99%   Physical Exam  Constitutional: She is oriented to person, place, and time. She appears well-developed and well-nourished.  HENT:  Head: Normocephalic and atraumatic.  Eyes: Conjunctivae are normal. Pupils are equal, round, and reactive to light. Right eye exhibits no discharge. Left eye exhibits no discharge. No scleral icterus.  Neck: Normal range of motion. No JVD present. No tracheal deviation present.   Pulmonary/Chest: Effort normal. No stridor.  Musculoskeletal:  Small amount of redness, swelling, tenderness to palpation of the right lateral malleolus and ankle. Manipulation of the ankle joint causes no pain around atrial, pain with deep plantar flexion and dorsiflexion  Neurological: She is alert and oriented to person, place, and time. Coordination normal.  Psychiatric: She has a normal mood and affect. Her behavior is normal. Judgment and thought content normal.  Nursing note and vitals reviewed.  ED Course  Procedures (including critical care time) Labs Review Labs Reviewed - No data to display  Imaging Review Dg Ankle Complete Right  07/15/2015  CLINICAL DATA:  82 year old female with pain and swelling of the lateral malleolus for EXAM: RIGHT ANKLE - COMPLETE 3+ VIEW COMPARISON:  Right ankle radiograph dated 12/07/2013 FINDINGS: There is no acute fracture cor dislocation. The bones are osteopenic. There are degenerative changes of the ankle joint. A curvilinear density inferior to the tip of the lateral malleolus appears similar to prior study likely related chronic changes. There is soft tissue swelling over the lateral malleolus. No radiopaque foreign object identified. IMPRESSION: No acute fracture or dislocation. Osteopenia with degenerative changes. Electronically Signed   By: Anner Crete M.D.   On: 07/15/2015 19:45   I have personally reviewed and evaluated these images and lab results as part of my medical decision-making.   EKG Interpretation None      MDM   Final diagnoses:  Acute gout of right ankle, unspecified cause    Labs:  Imaging:DG ankle  Consults:  Therapeutics:  Discharge Meds:   Assessment/Plan: Patient's presentation is most consistent with gout. She has a history of the same, she has no systemic symptoms, I am able to manipulate the joint without significant pain, she has no pain to palpation of the medial aspect of the ankle. I very low  suspicion for septic arthritis in this patient, with history of the same most likely gout. Patient will be given steroids, pain medication, encouraged follow-up with her primary care provider tomorrow for reevaluation and further management. She is given strict return cautioned, she verbalizes understanding and agreement today's plan had no further questions or concerns at time of discharge        Okey Regal, PA-C 07/16/15 0058  Lacretia Leigh, MD 07/18/15 0830

## 2015-07-15 NOTE — ED Notes (Signed)
Pt c/o right ankle pain, edema and erythema. No trauma.

## 2015-07-17 ENCOUNTER — Encounter: Payer: Self-pay | Admitting: Gastroenterology

## 2015-07-29 DIAGNOSIS — M4806 Spinal stenosis, lumbar region: Secondary | ICD-10-CM | POA: Diagnosis not present

## 2015-07-30 DIAGNOSIS — M7061 Trochanteric bursitis, right hip: Secondary | ICD-10-CM | POA: Diagnosis not present

## 2015-08-02 ENCOUNTER — Ambulatory Visit: Payer: BLUE CROSS/BLUE SHIELD | Admitting: Internal Medicine

## 2015-08-06 DIAGNOSIS — M7661 Achilles tendinitis, right leg: Secondary | ICD-10-CM | POA: Diagnosis not present

## 2015-08-08 DIAGNOSIS — M7661 Achilles tendinitis, right leg: Secondary | ICD-10-CM | POA: Diagnosis not present

## 2015-08-13 DIAGNOSIS — M7661 Achilles tendinitis, right leg: Secondary | ICD-10-CM | POA: Diagnosis not present

## 2015-08-22 ENCOUNTER — Ambulatory Visit (INDEPENDENT_AMBULATORY_CARE_PROVIDER_SITE_OTHER): Payer: Medicare Other | Admitting: Internal Medicine

## 2015-08-22 DIAGNOSIS — R05 Cough: Secondary | ICD-10-CM

## 2015-08-22 DIAGNOSIS — R059 Cough, unspecified: Secondary | ICD-10-CM

## 2015-08-22 LAB — PULMONARY FUNCTION TEST
DL/VA % pred: 124 %
DL/VA: 4.86 ml/min/mmHg/L
DLCO cor % pred: 81 %
DLCO cor: 13.17 ml/min/mmHg
DLCO unc % pred: 83 %
DLCO unc: 13.44 ml/min/mmHg
FEF 25-75 Post: 3.95 L/sec
FEF 25-75 Pre: 1.92 L/sec
FEF2575-%Change-Post: 106 %
FEF2575-%Pred-Post: 500 %
FEF2575-%Pred-Pre: 242 %
FEV1-%Change-Post: 27 %
FEV1-%Pred-Post: 170 %
FEV1-%Pred-Pre: 133 %
FEV1-Post: 1.63 L
FEV1-Pre: 1.28 L
FEV1FVC-%Change-Post: 1 %
FEV1FVC-%Pred-Pre: 117 %
FEV6-%Change-Post: 25 %
FEV6-%Pred-Post: 153 %
FEV6-%Pred-Pre: 122 %
FEV6-Post: 1.82 L
FEV6-Pre: 1.44 L
FEV6FVC-%Change-Post: 0 %
FEV6FVC-%Pred-Post: 106 %
FEV6FVC-%Pred-Pre: 105 %
FVC-%Change-Post: 24 %
FVC-%Pred-Post: 144 %
FVC-%Pred-Pre: 115 %
FVC-Post: 1.82 L
FVC-Pre: 1.46 L
Post FEV1/FVC ratio: 90 %
Post FEV6/FVC ratio: 100 %
Pre FEV1/FVC ratio: 88 %
Pre FEV6/FVC Ratio: 99 %
RV % pred: 63 %
RV: 1.37 L
TLC % pred: 76 %
TLC: 3.19 L

## 2015-08-22 NOTE — Progress Notes (Signed)
PFT done today. 

## 2015-08-26 ENCOUNTER — Encounter: Payer: Self-pay | Admitting: Family

## 2015-08-27 DIAGNOSIS — M7661 Achilles tendinitis, right leg: Secondary | ICD-10-CM | POA: Diagnosis not present

## 2015-08-29 DIAGNOSIS — M7661 Achilles tendinitis, right leg: Secondary | ICD-10-CM | POA: Diagnosis not present

## 2015-09-02 ENCOUNTER — Ambulatory Visit (INDEPENDENT_AMBULATORY_CARE_PROVIDER_SITE_OTHER): Payer: Medicare Other | Admitting: Internal Medicine

## 2015-09-02 ENCOUNTER — Encounter: Payer: Self-pay | Admitting: Internal Medicine

## 2015-09-02 VITALS — BP 152/82 | HR 63 | Temp 98.0°F | Resp 16 | Ht 59.0 in | Wt 154.0 lb

## 2015-09-02 DIAGNOSIS — R1013 Epigastric pain: Secondary | ICD-10-CM | POA: Diagnosis not present

## 2015-09-02 DIAGNOSIS — R109 Unspecified abdominal pain: Secondary | ICD-10-CM | POA: Diagnosis not present

## 2015-09-02 NOTE — Assessment & Plan Note (Signed)
Right-sided abdominal pain, intermittent, severe, lasts a few minutes Tends to occur more when she bends over in that direction Possible hernia-she did have an appendectomy in the past No relation to bowel movements, eating We'll get an abdominal ultrasound and further evaluation depending on the results of the ultrasound

## 2015-09-02 NOTE — Progress Notes (Signed)
Subjective:    Patient ID: [name], female    DOB: 27-Sep-1930, 82 y.o.   MRN: [id number]  HPI She is here for an acute visit for abdominal pain.  The pain is intermittent and has been occurring for at least 2 years. She gets a cramping or knot sensation in the right upper, epigastrium region-sternal area and a separate pain in the right mid quadrant.  When the pain comes it is severe. It typically only last a few minutes, possibly slightly longer. It goes away on its own and she has not had an of time to do anything about it as far as trying medication or change position. The pain is not related to eating, bowel movements, position or activity. The pain does not radiate. The pain in the right side does tend to occur when she is leaning over in that direction. She thinks it occurs approximately twice a month.  She denies any associated chest pain, palpitations, shortness breath or heartburn.   Looking through past records in 2010 she had an EGD that showed a small hiatal hernia. She has had gastritis at that time, but this has healed and she has not had any issues since then  Medications and allergies reviewed with patient and updated if appropriate.  Patient Active Problem List   Diagnosis Date Noted  . Right knee pain 06/14/2015  . Cough 05/02/2015  . Diabetic neuropathy ([place]) 03/21/2014  . Bunion 03/21/2014  . PVD (pulmonary valve disease) 01/23/2014  . Onychomycosis 12/20/2013  . PVD (peripheral vascular disease) ([place]) 12/19/2013  . Gout 12/13/2013  . Depression   . Spinal stenosis of lumbar region 04/24/2013  . Sciatica 09/05/2012  . Palpitations 07/06/2012  . Arthritis of shoulder region, left 09/11/2011  . Memory loss 07/23/2010  . Type 2 diabetes mellitus ([place]) 01/17/2009  . Anemia 10/15/2008  . CAD, NATIVE VESSEL 06/09/2008  . Hyperlipidemia 06/15/2007  . Essential hypertension 06/15/2007  . GERD 06/15/2007  . Fibromyalgia syndrome 06/15/2007  .  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 DIVERTICULOSIS, 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    Current Outpatient Prescriptions on File Prior to Visit  Medication Sig Dispense Refill  . amLODipine (NORVASC) 5 MG tablet Take 1 tablet (5 mg total) by mouth 2 (two) times daily. 180 tablet 3  . aspirin EC 81 MG tablet Take 81 mg by mouth daily.    Marland Kitchen atorvastatin (LIPITOR) 40 MG tablet Take 1 tablet (40 mg total) by mouth daily. 90 tablet 3  . carvedilol (COREG) 6.25 MG tablet Take 1 tablet (6.25 mg total) by mouth 2 (two) times daily with a meal. 180 tablet 3  . cetirizine (ZYRTEC) 10 MG tablet Take 1 tablet (10 mg total) by mouth daily. 30 tablet 11  . Cyanocobalamin (VITAMIN B-12 PO) Take 1 tablet by mouth daily with breakfast.     . docusate sodium (COLACE) 100 MG capsule Take 2 capsules (200 mg total) by mouth daily. (Patient taking differently: Take 100 mg by mouth 2 (two) times daily. ) 60 capsule 5  . DULoxetine (CYMBALTA) 30 MG capsule Take 1 capsule (30 mg total) by mouth daily. 30 capsule 3  . gabapentin (NEURONTIN) 100 MG capsule Take 1 capsule (100 mg total) by mouth 2 (two) times daily. (Patient taking differently: Take 100 mg by mouth daily. ) 180 capsule 1  . gabapentin (NEURONTIN) 300 MG capsule Take 1 capsule (300 mg total) by mouth at bedtime. To take in addition to the daytime dosing (Patient taking differently:  Take 600 mg by mouth at bedtime. To take in addition to the daytime dosing) 180 capsule 1  . HYDROcodone-acetaminophen (NORCO/VICODIN) 5-325 MG tablet Take 1 tablet by mouth every 4 (four) hours as needed. 10 tablet 0  . lidocaine (LIDODERM) 5 % Place 1 patch onto the skin daily. Remove & Discard patch within 12 hours or as directed by MD 60 patch 0  . meloxicam (MOBIC) 7.5 MG tablet Take 1 tablet (7.5 mg total) by mouth daily. 90 tablet 3  . omeprazole (PRILOSEC) 40 MG capsule Take 1 capsule (40 mg total) by mouth daily. 90 capsule 3  . ramipril (ALTACE) 10 MG capsule Take 1 capsule (10  mg total) by mouth daily. 90 capsule 3   No current facility-administered medications on file prior to visit.    Past Medical History  Diagnosis Date  . S/P CABG x 1   . S/P inguinal hernia repair   . TUBULOVILLOUS ADENOMA, 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    colo 04/2012 - no polyps - no further screening planned (age >15)  . CAD, NATIVE VESSEL 06/09/2008  . UNSPECIFIED ANEMIA   . DIABETES MELLITUS, TYPE II   . GERD   . HYPERLIPIDEMIA   . HYPERTENSION   . HIATAL HERNIA   . FIBROMYALGIA   . OA (osteoarthritis)     severe, shoulders, hands - inflammatory, ?RA  . Gout   . Insomnia     Past Surgical History  Procedure Laterality Date  . Hernia repair    . Abdominal hysterectomy  1981  . Coronary artery bypass graft  2010  . Appendectomy    . Replacement total knee bilateral Bilateral     Rt-1974, Lt -1989  . Rotator cuff repair      Left  . Ovarian cyst removal    . Cataract extraction, bilateral    . Reverse shoulder arthroplasty  09/08/2011    Procedure: REVERSE SHOULDER ARTHROPLASTY;  Surgeon: Nita Sells, MD;  Location: [place];  Service: Orthopedics;  Laterality: Left;  left reverse total shoulder arthroplasty  . Joint replacement      Social History   Social History  . Marital Status: Widowed    Spouse Name: N/A  . Number of Children: N/A  . Years of Education: N/A   Social History Main Topics  . Smoking status: Former Smoker    Quit date: 04/26/1970  . Smokeless tobacco: Never Used  . Alcohol Use: 0.6 oz/week    1 Glasses of wine per week  . Drug Use: No  . Sexual Activity: Not Asked   Other Topics Concern  . 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 Cancer Mother     Stomach  . Stomach cancer Mother 96  . Hypertension Mother   . Heart disease Father 55    MI  . Diabetes Father   . Heart attack Father   . Diabetes Sister   . Heart disease  Sister   . Heart attack Brother   . Cancer Brother   . Diabetes Brother   . Anesthesia problems Neg Hx   . Colon cancer Neg Hx   . Diabetes Daughter   . Hypertension Daughter     Review of Systems  Constitutional: Negative for fever and chills.  HENT: Negative for trouble swallowing.   Cardiovascular: Negative for chest pain and palpitations.  Gastrointestinal: Positive for  abdominal pain and constipation. Negative for nausea and diarrhea.       Gerd controlled       Objective:   Filed Vitals:   09/02/15 1626  BP: 152/82  Pulse: 63  Temp: 98 F (36.7 C)  Resp: 16   Filed Weights   09/02/15 1626  Weight: 154 lb (69.854 kg)   Body mass index is 31.09 kg/(m^2).   Physical Exam  Constitutional: She appears well-developed and well-nourished. No distress.  Abdominal: Soft. She exhibits no distension. There is tenderness. There is no rebound and no guarding.  Obese, no tenderness in epigastric region or right upper quadrant, slight lumpy be feeling and right mid quadrant-? Hernia, no discrete mass  Skin: She is not diaphoretic.          Assessment & Plan:   See Problem List for Assessment and Plan of chronic medical problems.

## 2015-09-02 NOTE — Progress Notes (Signed)
Pre visit review using our clinic review tool, if applicable. No additional management support is needed unless otherwise documented below in the visit note. 

## 2015-09-02 NOTE — Assessment & Plan Note (Signed)
Epigastric pain is intermittent and not frequent Not typical of the gallbladder Possibly esophageal spasm or hiatal hernia Will check ultrasound of abdomen

## 2015-09-02 NOTE — Patient Instructions (Addendum)
   Medications reviewed and updated.  No changes recommended at this time.  An ultrasound of your abdomen was ordered - we will contact you to schedule this.

## 2015-09-05 DIAGNOSIS — M7661 Achilles tendinitis, right leg: Secondary | ICD-10-CM | POA: Diagnosis not present

## 2015-09-10 ENCOUNTER — Ambulatory Visit
Admission: RE | Admit: 2015-09-10 | Discharge: 2015-09-10 | Disposition: A | Discharge status: Home / Self Care | Payer: Medicare Other | Source: Ambulatory Visit | Attending: Internal Medicine | Admitting: Internal Medicine

## 2015-09-10 DIAGNOSIS — R1013 Epigastric pain: Secondary | ICD-10-CM

## 2015-09-10 DIAGNOSIS — R1011 Right upper quadrant pain: Secondary | ICD-10-CM | POA: Diagnosis not present

## 2015-09-11 ENCOUNTER — Encounter: Payer: Self-pay | Admitting: Internal Medicine

## 2015-09-19 DIAGNOSIS — M7061 Trochanteric bursitis, right hip: Secondary | ICD-10-CM | POA: Diagnosis not present

## 2015-09-26 ENCOUNTER — Ambulatory Visit: Payer: Medicare Other | Admitting: Podiatry

## 2015-09-27 ENCOUNTER — Other Ambulatory Visit (INDEPENDENT_AMBULATORY_CARE_PROVIDER_SITE_OTHER): Payer: Medicare Other

## 2015-09-27 ENCOUNTER — Encounter: Payer: Self-pay | Admitting: Internal Medicine

## 2015-09-27 ENCOUNTER — Ambulatory Visit (INDEPENDENT_AMBULATORY_CARE_PROVIDER_SITE_OTHER): Payer: Medicare Other | Admitting: Internal Medicine

## 2015-09-27 VITALS — BP 144/80 | HR 71 | Temp 97.9°F | Resp 16 | Wt 154.0 lb

## 2015-09-27 DIAGNOSIS — I10 Essential (primary) hypertension: Secondary | ICD-10-CM

## 2015-09-27 DIAGNOSIS — F32A Depression, unspecified: Secondary | ICD-10-CM

## 2015-09-27 DIAGNOSIS — E1140 Type 2 diabetes mellitus with diabetic neuropathy, unspecified: Secondary | ICD-10-CM

## 2015-09-27 DIAGNOSIS — F329 Major depressive disorder, single episode, unspecified: Secondary | ICD-10-CM

## 2015-09-27 DIAGNOSIS — M4806 Spinal stenosis, lumbar region: Secondary | ICD-10-CM

## 2015-09-27 DIAGNOSIS — E785 Hyperlipidemia, unspecified: Secondary | ICD-10-CM

## 2015-09-27 DIAGNOSIS — M48061 Spinal stenosis, lumbar region without neurogenic claudication: Secondary | ICD-10-CM

## 2015-09-27 DIAGNOSIS — K219 Gastro-esophageal reflux disease without esophagitis: Secondary | ICD-10-CM

## 2015-09-27 LAB — CBC WITH DIFFERENTIAL/PLATELET
Basophils Absolute: 0 10*3/uL (ref 0.0–0.1)
Basophils Relative: 0.4 % (ref 0.0–3.0)
Eosinophils Absolute: 0.2 10*3/uL (ref 0.0–0.7)
Eosinophils Relative: 2.5 % (ref 0.0–5.0)
HCT: 35.5 % — ABNORMAL LOW (ref 36.0–46.0)
Hemoglobin: 11.3 g/dL — ABNORMAL LOW (ref 12.0–15.0)
Lymphocytes Relative: 25.9 % (ref 12.0–46.0)
Lymphs Abs: 2.3 10*3/uL (ref 0.7–4.0)
MCHC: 31.9 g/dL (ref 30.0–36.0)
MCV: 82.8 fl (ref 78.0–100.0)
Monocytes Absolute: 0.7 10*3/uL (ref 0.1–1.0)
Monocytes Relative: 8.3 % (ref 3.0–12.0)
Neutro Abs: 5.5 10*3/uL (ref 1.4–7.7)
Neutrophils Relative %: 62.9 % (ref 43.0–77.0)
Platelets: 232 10*3/uL (ref 150.0–400.0)
RBC: 4.28 Mil/uL (ref 3.87–5.11)
RDW: 16.9 % — ABNORMAL HIGH (ref 11.5–15.5)
WBC: 8.8 10*3/uL (ref 4.0–10.5)

## 2015-09-27 LAB — COMPREHENSIVE METABOLIC PANEL
ALT: 12 U/L (ref 0–35)
AST: 16 U/L (ref 0–37)
Albumin: 4 g/dL (ref 3.5–5.2)
Alkaline Phosphatase: 65 U/L (ref 39–117)
BUN: 17 mg/dL (ref 6–23)
CO2: 31 mEq/L (ref 19–32)
Calcium: 9.4 mg/dL (ref 8.4–10.5)
Chloride: 100 mEq/L (ref 96–112)
Creatinine, Ser: 1 mg/dL (ref 0.40–1.20)
GFR: 68.05 mL/min (ref >60.00)
Glucose, Bld: 108 mg/dL — ABNORMAL HIGH (ref 70–99)
Potassium: 4.1 mEq/L (ref 3.5–5.1)
Sodium: 137 mEq/L (ref 135–145)
Total Bilirubin: 0.6 mg/dL (ref 0.2–1.2)
Total Protein: 7.1 g/dL (ref 6.0–8.3)

## 2015-09-27 LAB — LIPID PANEL
Cholesterol: 184 mg/dL (ref 0–200)
HDL: 88.1 mg/dL (ref >39.00)
LDL Cholesterol: 79 mg/dL (ref 0–99)
NonHDL: 96.19
Total CHOL/HDL Ratio: 2
Triglycerides: 84 mg/dL (ref 0.0–149.0)
VLDL: 16.8 mg/dL (ref 0.0–40.0)

## 2015-09-27 LAB — HEMOGLOBIN A1C: Hgb A1c MFr Bld: 6.4 % (ref 4.6–6.5)

## 2015-09-27 NOTE — Assessment & Plan Note (Addendum)
BP slightly elevated today, controlled at home Will continue to monitor Continue current medication at current doses

## 2015-09-27 NOTE — Assessment & Plan Note (Signed)
Will check lipid panel Taking atorvastatin 40 mg daily

## 2015-09-27 NOTE — Progress Notes (Signed)
Subjective:    Patient ID: [name], female    DOB: Aug 16, 1930, 82 y.o.   MRN: [id number]  HPI She is here for follow up.  Diabetes: She is taking her medication daily as prescribed. She is compliant with a diabetic diet. She is not exercising regularly, but will start swimming again next week. She has not been monitoring her sugars at home.    Hyperlipidemia: She is taking her medication daily. She is compliant with a low fat/cholesterol diet. She is not exercising regularly.   GERD:  She is taking her medication daily as prescribed.  She denies any GERD symptoms and feels her GERD is well controlled.   Chronic lower back pain - spinal stenosis, arthritis , bursitis of hip:  She is following with a back specialist.  She had an injection last week and it did not help.  They have helped in the past.  Taking gabapentin.  Does not want surgery.  Applying ice to her back helps.    Hypertension: She is taking her medication daily. She is compliant with a low sodium diet.  She denies chest pain, palpitations, edema, shortness of breath and regular headaches. She is not exercising regularly.  She does monitor her blood pressure at home and it has been controlled.    Depression: She is taking her medication daily as prescribed. She denies any side effects from the medication. She feels her depression is well controlled and she is happy with her current dose of medication.     Medications and allergies reviewed with patient and updated if appropriate.  Patient Active Problem List   Diagnosis Date Noted  . Abdominal pain, epigastric 09/02/2015  . AP (abdominal pain) 09/02/2015  . Right knee pain 06/14/2015  . Cough 05/02/2015  . Diabetic neuropathy ([place]) 03/21/2014  . Bunion 03/21/2014  . PVD (pulmonary valve disease) 01/23/2014  . Onychomycosis 12/20/2013  . PVD (peripheral vascular disease) ([place]) 12/19/2013  . Gout 12/13/2013  . Depression   . Spinal stenosis of lumbar region  04/24/2013  . Sciatica 09/05/2012  . Palpitations 07/06/2012  . Arthritis of shoulder region, left 09/11/2011  . Memory loss 07/23/2010  . Type 2 diabetes mellitus ([place]) 01/17/2009  . Anemia 10/15/2008  . CAD, NATIVE VESSEL 06/09/2008  . Hyperlipidemia 06/15/2007  . Essential hypertension 06/15/2007  . GERD 06/15/2007  . Fibromyalgia syndrome 06/15/2007  . 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 DIVERTICULOSIS, 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    Current Outpatient Prescriptions on File Prior to Visit  Medication Sig Dispense Refill  . amLODipine (NORVASC) 5 MG tablet Take 1 tablet (5 mg total) by mouth 2 (two) times daily. 180 tablet 3  . aspirin EC 81 MG tablet Take 81 mg by mouth daily.    Marland Kitchen atorvastatin (LIPITOR) 40 MG tablet Take 1 tablet (40 mg total) by mouth daily. 90 tablet 3  . carvedilol (COREG) 6.25 MG tablet Take 1 tablet (6.25 mg total) by mouth 2 (two) times daily with a meal. 180 tablet 3  . cetirizine (ZYRTEC) 10 MG tablet Take 1 tablet (10 mg total) by mouth daily. 30 tablet 11  . Cyanocobalamin (VITAMIN B-12 PO) Take 1 tablet by mouth daily with breakfast.     . docusate sodium (COLACE) 100 MG capsule Take 2 capsules (200 mg total) by mouth daily. (Patient taking differently: Take 100 mg by mouth 2 (two) times daily. ) 60 capsule 5  . DULoxetine (CYMBALTA) 30 MG capsule Take 1 capsule (30 mg total) by  mouth daily. 30 capsule 3  . gabapentin (NEURONTIN) 100 MG capsule Take 1 capsule (100 mg total) by mouth 2 (two) times daily. (Patient taking differently: Take 100 mg by mouth daily. ) 180 capsule 1  . gabapentin (NEURONTIN) 300 MG capsule Take 1 capsule (300 mg total) by mouth at bedtime. To take in addition to the daytime dosing (Patient taking differently: Take 600 mg by mouth at bedtime. To take in addition to the daytime dosing) 180 capsule 1  . HYDROcodone-acetaminophen (NORCO/VICODIN) 5-325 MG tablet Take 1 tablet by mouth every 4 (four) hours as needed. 10 tablet 0  .  lidocaine (LIDODERM) 5 % Place 1 patch onto the skin daily. Remove & Discard patch within 12 hours or as directed by MD 60 patch 0  . meloxicam (MOBIC) 7.5 MG tablet Take 1 tablet (7.5 mg total) by mouth daily. 90 tablet 3  . omeprazole (PRILOSEC) 40 MG capsule Take 1 capsule (40 mg total) by mouth daily. 90 capsule 3  . ramipril (ALTACE) 10 MG capsule Take 1 capsule (10 mg total) by mouth daily. 90 capsule 3   No current facility-administered medications on file prior to visit.    Past Medical History  Diagnosis Date  . S/P CABG x 1   . S/P inguinal hernia repair   . TUBULOVILLOUS ADENOMA, 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    colo 04/2012 - no polyps - no further screening planned (age >9)  . CAD, NATIVE VESSEL 06/09/2008  . UNSPECIFIED ANEMIA   . DIABETES MELLITUS, TYPE II   . GERD   . HYPERLIPIDEMIA   . HYPERTENSION   . HIATAL HERNIA   . FIBROMYALGIA   . OA (osteoarthritis)     severe, shoulders, hands - inflammatory, ?RA  . Gout   . Insomnia     Past Surgical History  Procedure Laterality Date  . Abdominal hysterectomy  1981  . Coronary artery bypass graft  2010  . Appendectomy    . Replacement total knee bilateral Bilateral     Rt-1974, Lt -1989  . Rotator cuff repair      Left  . Ovarian cyst removal    . Cataract extraction, bilateral    . Reverse shoulder arthroplasty  09/08/2011    Procedure: REVERSE SHOULDER ARTHROPLASTY;  Surgeon: Nita Sells, MD;  Location: [place];  Service: Orthopedics;  Laterality: Left;  left reverse total shoulder arthroplasty  . Joint replacement    . Hernia repair      INGUINAL    Social History   Social History  . Marital Status: Widowed    Spouse Name: N/A  . Number of Children: N/A  . Years of Education: N/A   Social History Main Topics  . Smoking status: Former Smoker    Quit date: 04/26/1970  . Smokeless tobacco: Never Used  . Alcohol Use: 0.6 oz/week    1 Glasses of wine per week  . Drug Use: No  . Sexual Activity: Not  Asked   Other Topics Concern  . 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 Cancer Mother     Stomach  . Stomach cancer Mother 10  . Hypertension Mother   . Heart disease Father 45    MI  . Diabetes Father   . Heart attack Father   . Diabetes Sister   . Heart disease Sister   . Heart  attack Brother   . Cancer Brother   . Diabetes Brother   . Anesthesia problems Neg Hx   . Colon cancer Neg Hx   . Diabetes Daughter   . Hypertension Daughter     Review of Systems  Constitutional: Negative for fever and appetite change.  Respiratory: Negative for cough, shortness of breath and wheezing.   Cardiovascular: Negative for chest pain, palpitations and leg swelling.  Gastrointestinal: Positive for abdominal pain (occasional).  Musculoskeletal: Positive for back pain and arthralgias.  Neurological: Negative for light-headedness and headaches.       Objective:   Filed Vitals:   09/27/15 1549  BP: 144/80  Pulse: 71  Temp: 97.9 F (36.6 C)  Resp: 16   Filed Weights   09/27/15 1549  Weight: 154 lb (69.854 kg)   Body mass index is 31.09 kg/(m^2).   Physical Exam Constitutional: Appears well-developed and well-nourished. No distress.  Neck: Neck supple. No tracheal deviation present. No thyromegaly present.  No carotid bruit. No cervical adenopathy.   Cardiovascular: Normal rate, regular rhythm and normal heart sounds.   No murmur heard.  No edema Pulmonary/Chest: Effort normal and breath sounds normal. No respiratory distress. No wheezes.       Assessment & Plan:   See Problem List for Assessment and Plan of chronic medical problems.  F/u in 3 months

## 2015-09-27 NOTE — Assessment & Plan Note (Signed)
Getting injections - one last week did not help Has f/u next month Taking gabapentin

## 2015-09-27 NOTE — Assessment & Plan Note (Signed)
Taking cymbalta 30 mg daily Controlled, stable Continue current dose of medication

## 2015-09-27 NOTE — Progress Notes (Signed)
Pre visit review using our clinic review tool, if applicable. No additional management support is needed unless otherwise documented below in the visit note. 

## 2015-09-27 NOTE — Assessment & Plan Note (Signed)
GERD controlled Continue daily medication  

## 2015-09-27 NOTE — Assessment & Plan Note (Addendum)
Controlled with diet Check a1c Will check sugars at home and will start swimming

## 2015-09-27 NOTE — Patient Instructions (Addendum)
  Test(s) ordered today. Your results will be released to MyChart (or called to you) after review, usually within 72hours after test completion. If any changes need to be made, you will be notified at that same time.  All other Health Maintenance issues reviewed.   All recommended immunizations and age-appropriate screenings are up-to-date or discussed.  No immunizations administered today.   Medications reviewed and updated.  No changes recommended at this time.   Please followup in 3 months   

## 2015-09-28 ENCOUNTER — Encounter: Payer: Self-pay | Admitting: Internal Medicine

## 2015-09-30 ENCOUNTER — Telehealth: Payer: Self-pay | Admitting: Internal Medicine

## 2015-09-30 DIAGNOSIS — M5416 Radiculopathy, lumbar region: Secondary | ICD-10-CM | POA: Diagnosis not present

## 2015-09-30 DIAGNOSIS — M545 Low back pain: Secondary | ICD-10-CM | POA: Diagnosis not present

## 2015-09-30 NOTE — Telephone Encounter (Signed)
Please advise 

## 2015-09-30 NOTE — Telephone Encounter (Signed)
Pt request refill for HYDROcodone-acetaminophen (NORCO/VICODIN) 5-325 to be send to [facility]. Please help, she is in pain

## 2015-09-30 NOTE — Telephone Encounter (Signed)
I have never prescribed this medication and we did not discuss it at her last visit - she needs to come in to discuss or have her back specialist prescribe

## 2015-09-30 NOTE — Telephone Encounter (Signed)
Spoke with pt. She will be going to PT today and is close to her back specialist. Advised pt to ask for Med while in office. Pt agreed

## 2015-10-02 ENCOUNTER — Ambulatory Visit: Payer: Medicare Other | Admitting: Podiatry

## 2015-10-02 DIAGNOSIS — S62629A Displaced fracture of medial phalanx of unspecified finger, initial encounter for closed fracture: Secondary | ICD-10-CM | POA: Diagnosis not present

## 2015-10-02 DIAGNOSIS — M25561 Pain in right knee: Secondary | ICD-10-CM | POA: Diagnosis not present

## 2015-10-02 DIAGNOSIS — M545 Low back pain: Secondary | ICD-10-CM | POA: Diagnosis not present

## 2015-10-02 DIAGNOSIS — Z96611 Presence of right artificial shoulder joint: Secondary | ICD-10-CM | POA: Diagnosis not present

## 2015-10-02 DIAGNOSIS — Z96612 Presence of left artificial shoulder joint: Secondary | ICD-10-CM | POA: Diagnosis not present

## 2015-10-02 DIAGNOSIS — M25562 Pain in left knee: Secondary | ICD-10-CM | POA: Diagnosis not present

## 2015-10-03 DIAGNOSIS — M5416 Radiculopathy, lumbar region: Secondary | ICD-10-CM | POA: Diagnosis not present

## 2015-10-03 DIAGNOSIS — M545 Low back pain: Secondary | ICD-10-CM | POA: Diagnosis not present

## 2015-10-07 DIAGNOSIS — M545 Low back pain: Secondary | ICD-10-CM | POA: Diagnosis not present

## 2015-10-07 DIAGNOSIS — M5416 Radiculopathy, lumbar region: Secondary | ICD-10-CM | POA: Diagnosis not present

## 2015-10-10 ENCOUNTER — Ambulatory Visit: Payer: Medicare Other | Admitting: Podiatry

## 2015-10-15 ENCOUNTER — Emergency Department (HOSPITAL_COMMUNITY)
Admission: EM | Admit: 2015-10-15 | Discharge: 2015-10-15 | Disposition: A | Discharge status: Home / Self Care | Payer: Medicare Other | Attending: Emergency Medicine | Admitting: Emergency Medicine

## 2015-10-15 ENCOUNTER — Encounter (HOSPITAL_COMMUNITY): Payer: Self-pay | Admitting: Vascular Surgery

## 2015-10-15 ENCOUNTER — Emergency Department (HOSPITAL_COMMUNITY): Payer: Medicare Other

## 2015-10-15 DIAGNOSIS — M549 Dorsalgia, unspecified: Secondary | ICD-10-CM | POA: Diagnosis not present

## 2015-10-15 DIAGNOSIS — E119 Type 2 diabetes mellitus without complications: Secondary | ICD-10-CM | POA: Insufficient documentation

## 2015-10-15 DIAGNOSIS — M5136 Other intervertebral disc degeneration, lumbar region: Secondary | ICD-10-CM | POA: Insufficient documentation

## 2015-10-15 DIAGNOSIS — Z951 Presence of aortocoronary bypass graft: Secondary | ICD-10-CM | POA: Diagnosis not present

## 2015-10-15 DIAGNOSIS — M4806 Spinal stenosis, lumbar region: Secondary | ICD-10-CM | POA: Insufficient documentation

## 2015-10-15 DIAGNOSIS — Y9389 Activity, other specified: Secondary | ICD-10-CM | POA: Diagnosis not present

## 2015-10-15 DIAGNOSIS — I2510 Atherosclerotic heart disease of native coronary artery without angina pectoris: Secondary | ICD-10-CM | POA: Insufficient documentation

## 2015-10-15 DIAGNOSIS — Y92481 Parking lot as the place of occurrence of the external cause: Secondary | ICD-10-CM | POA: Insufficient documentation

## 2015-10-15 DIAGNOSIS — Y999 Unspecified external cause status: Secondary | ICD-10-CM | POA: Diagnosis not present

## 2015-10-15 DIAGNOSIS — W19XXXA Unspecified fall, initial encounter: Secondary | ICD-10-CM | POA: Insufficient documentation

## 2015-10-15 DIAGNOSIS — Z96653 Presence of artificial knee joint, bilateral: Secondary | ICD-10-CM | POA: Diagnosis not present

## 2015-10-15 DIAGNOSIS — Z7982 Long term (current) use of aspirin: Secondary | ICD-10-CM | POA: Insufficient documentation

## 2015-10-15 DIAGNOSIS — Z87891 Personal history of nicotine dependence: Secondary | ICD-10-CM | POA: Diagnosis not present

## 2015-10-15 DIAGNOSIS — I10 Essential (primary) hypertension: Secondary | ICD-10-CM | POA: Insufficient documentation

## 2015-10-15 DIAGNOSIS — Z79899 Other long term (current) drug therapy: Secondary | ICD-10-CM | POA: Insufficient documentation

## 2015-10-15 DIAGNOSIS — M5440 Lumbago with sciatica, unspecified side: Secondary | ICD-10-CM | POA: Diagnosis not present

## 2015-10-15 DIAGNOSIS — M48061 Spinal stenosis, lumbar region without neurogenic claudication: Secondary | ICD-10-CM

## 2015-10-15 DIAGNOSIS — S3992XA Unspecified injury of lower back, initial encounter: Secondary | ICD-10-CM | POA: Diagnosis not present

## 2015-10-15 DIAGNOSIS — M545 Low back pain: Secondary | ICD-10-CM | POA: Diagnosis not present

## 2015-10-15 MED ORDER — TRAMADOL HCL 50 MG PO TABS
50.0000 mg | ORAL_TABLET | Freq: Once | ORAL | Status: AC
  Administered 2015-10-15: 50 mg via ORAL
  Filled 2015-10-15: qty 1
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
## 2015-10-15 NOTE — ED Notes (Signed)
Case Manager at bedside
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
## 2015-10-15 NOTE — ED Notes (Signed)
Patient requesting that this RN contact her neighbor vickie to pick her up at discharge, patient's neighbor contacted and she advised this RN she would be on the way to pick up patient.

## 2015-10-15 NOTE — Care Management Note (Signed)
Case Management Note  Patient Details  Name: [name] MRN: [id number] Date of Birth: 30-Jun-1930  Subjective/Objective:      CAD, HTN, HLD, DM who presents to the ED for evaluation of low back pain. She states that two week ago today she fell in the parking lot onto her hands and knees. Patient is complaining of back pain with difficulty ambulating.    Action/Plan: CM met with patient in room to discuss recommendations for [facility] services HHPT/ OT, patient is agreeable with recommendations for discharge plan. Offered choice, selected [facility]. verified contact information. Referral faxed to [facility] [phone],  CM discussed [facility] with patient she may benefit from [facility] she was agreeable,referral placed. Explained that [facility] will contact her within 24-48 hours by phone prior to arrange the visit, and [facility] will contact her as well within the next 3 days. Patient verbalized understanding no further questions or concerns voiced.  No further CM needs identified.  Expected Discharge Date:    10/15/2015             Expected Discharge Plan:  [place]  In-House Referral:     Discharge planning Services  CM Consult  Post Acute Care Choice:  Home Health Choice offered to:  Patient  DME Arranged:    DME Agency:     HH Arranged:  OT, PT [place] Agency:  [place]  Status of Service:  Completed, signed off  If discussed at [employer], dates discussed:    Additional CommentsLaurena Slimmer, RN 10/15/2015, 9:26 PM

## 2015-10-15 NOTE — Discharge Instructions (Signed)
Your MRI today showed degenerative disc disease and spinal stenosis that is consistent with prior imaging you have had. There were no new findings or fracture. You may increase your hydrocodone at home to three to four times daily. We have ordered home physical therapy for you. Please follow up with Dr. Berenice Primas as soon as possible. Return to the ER for new or worsening symptoms.
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
## 2015-10-17 DIAGNOSIS — M4806 Spinal stenosis, lumbar region: Secondary | ICD-10-CM | POA: Diagnosis not present

## 2015-10-17 DIAGNOSIS — Z7984 Long term (current) use of oral hypoglycemic drugs: Secondary | ICD-10-CM | POA: Diagnosis not present

## 2015-10-17 DIAGNOSIS — E119 Type 2 diabetes mellitus without complications: Secondary | ICD-10-CM | POA: Diagnosis not present

## 2015-10-17 DIAGNOSIS — Z7982 Long term (current) use of aspirin: Secondary | ICD-10-CM | POA: Diagnosis not present

## 2015-10-18 ENCOUNTER — Telehealth: Payer: Self-pay | Admitting: Internal Medicine

## 2015-10-18 DIAGNOSIS — R413 Other amnesia: Secondary | ICD-10-CM

## 2015-10-18 NOTE — Telephone Encounter (Signed)
Please advise 

## 2015-10-18 NOTE — Telephone Encounter (Signed)
Performed start of care after referral from ED.  Patient demonstrates sever pain in back.  Requesting orders to continue PT for fall risk reduction. frequency two times a week for three weeks and one time a week for one week.  Requesting verbal orders for speech therapy for cognition.  Patient reports increasing memory impairment the last 6 months.

## 2015-10-19 NOTE — Telephone Encounter (Signed)
[facility] for PT and speech.  She if she is interested in seeing neuro for further evaluation of her memory - I think that would be a very good idea.

## 2015-10-21 DIAGNOSIS — M4806 Spinal stenosis, lumbar region: Secondary | ICD-10-CM | POA: Diagnosis not present

## 2015-10-21 DIAGNOSIS — Z7982 Long term (current) use of aspirin: Secondary | ICD-10-CM | POA: Diagnosis not present

## 2015-10-21 DIAGNOSIS — E119 Type 2 diabetes mellitus without complications: Secondary | ICD-10-CM | POA: Diagnosis not present

## 2015-10-21 DIAGNOSIS — Z7984 Long term (current) use of oral hypoglycemic drugs: Secondary | ICD-10-CM | POA: Diagnosis not present

## 2015-10-21 NOTE — Telephone Encounter (Signed)
Spoke with pt. She would like to have the referral placed. Pt is complaining of a lot of pain since her fall. She is taking her hydrocodone (like prescribed before accident), icing, and trying to lay in different positions. She is unable to drive now and not able to get down the steps at her house to come in for [place]. Is there something that can be done for pt until she is able to come into the office. She is currently seeing homehealth for PT and OT.

## 2015-10-21 NOTE — Telephone Encounter (Signed)
She can temporarily increase her mobic to twice daily.  I do not want her to take this twice daily long term.    Neuro referral ordered

## 2015-10-23 NOTE — Telephone Encounter (Signed)
Spoke with pt to inform.  

## 2015-10-24 DIAGNOSIS — Z7982 Long term (current) use of aspirin: Secondary | ICD-10-CM | POA: Diagnosis not present

## 2015-10-24 DIAGNOSIS — Z7984 Long term (current) use of oral hypoglycemic drugs: Secondary | ICD-10-CM | POA: Diagnosis not present

## 2015-10-24 DIAGNOSIS — E119 Type 2 diabetes mellitus without complications: Secondary | ICD-10-CM | POA: Diagnosis not present

## 2015-10-24 DIAGNOSIS — M4806 Spinal stenosis, lumbar region: Secondary | ICD-10-CM | POA: Diagnosis not present

## 2015-10-25 DIAGNOSIS — Z7984 Long term (current) use of oral hypoglycemic drugs: Secondary | ICD-10-CM | POA: Diagnosis not present

## 2015-10-25 DIAGNOSIS — M4806 Spinal stenosis, lumbar region: Secondary | ICD-10-CM | POA: Diagnosis not present

## 2015-10-25 DIAGNOSIS — Z7982 Long term (current) use of aspirin: Secondary | ICD-10-CM | POA: Diagnosis not present

## 2015-10-25 DIAGNOSIS — E119 Type 2 diabetes mellitus without complications: Secondary | ICD-10-CM | POA: Diagnosis not present

## 2015-10-28 DIAGNOSIS — Z7982 Long term (current) use of aspirin: Secondary | ICD-10-CM | POA: Diagnosis not present

## 2015-10-28 DIAGNOSIS — E119 Type 2 diabetes mellitus without complications: Secondary | ICD-10-CM | POA: Diagnosis not present

## 2015-10-28 DIAGNOSIS — M4806 Spinal stenosis, lumbar region: Secondary | ICD-10-CM | POA: Diagnosis not present

## 2015-10-28 DIAGNOSIS — Z7984 Long term (current) use of oral hypoglycemic drugs: Secondary | ICD-10-CM | POA: Diagnosis not present

## 2015-10-29 DIAGNOSIS — Z7982 Long term (current) use of aspirin: Secondary | ICD-10-CM | POA: Diagnosis not present

## 2015-10-29 DIAGNOSIS — M4806 Spinal stenosis, lumbar region: Secondary | ICD-10-CM | POA: Diagnosis not present

## 2015-10-29 DIAGNOSIS — Z7984 Long term (current) use of oral hypoglycemic drugs: Secondary | ICD-10-CM | POA: Diagnosis not present

## 2015-10-29 DIAGNOSIS — E119 Type 2 diabetes mellitus without complications: Secondary | ICD-10-CM | POA: Diagnosis not present

## 2015-10-31 ENCOUNTER — Telehealth: Payer: Self-pay | Admitting: Internal Medicine

## 2015-10-31 DIAGNOSIS — M549 Dorsalgia, unspecified: Principal | ICD-10-CM

## 2015-10-31 DIAGNOSIS — M4806 Spinal stenosis, lumbar region: Secondary | ICD-10-CM | POA: Diagnosis not present

## 2015-10-31 DIAGNOSIS — Z7984 Long term (current) use of oral hypoglycemic drugs: Secondary | ICD-10-CM | POA: Diagnosis not present

## 2015-10-31 DIAGNOSIS — G8929 Other chronic pain: Secondary | ICD-10-CM

## 2015-10-31 DIAGNOSIS — E119 Type 2 diabetes mellitus without complications: Secondary | ICD-10-CM | POA: Diagnosis not present

## 2015-10-31 DIAGNOSIS — Z7982 Long term (current) use of aspirin: Secondary | ICD-10-CM | POA: Diagnosis not present

## 2015-10-31 NOTE — Telephone Encounter (Signed)
Message also given to Dr. Berenice Primas:  Patient continues to report pain.  Patient tolerates 5 to 10 minutes in standing prior to needing to lay day resulting.  Patient has ran out of hydrocodone 5.325.  Please consider referral for pain management.  Due to patients anxiety around addiction and unable to control pain for normal activities of daily living.

## 2015-10-31 NOTE — Telephone Encounter (Signed)
Pain management referral ordered

## 2015-10-31 NOTE — Telephone Encounter (Signed)
Spoke with pts daughter, she states that pt will be getting nerve block injection on Thursday 11/07/15. Pt states that the hydrocodone does not seem to be doing much good. Is there something else pt can do to help with the pain until she gets in with pain management.

## 2015-11-01 DIAGNOSIS — Z7982 Long term (current) use of aspirin: Secondary | ICD-10-CM | POA: Diagnosis not present

## 2015-11-01 DIAGNOSIS — Z7984 Long term (current) use of oral hypoglycemic drugs: Secondary | ICD-10-CM | POA: Diagnosis not present

## 2015-11-01 DIAGNOSIS — E119 Type 2 diabetes mellitus without complications: Secondary | ICD-10-CM | POA: Diagnosis not present

## 2015-11-01 DIAGNOSIS — M4806 Spinal stenosis, lumbar region: Secondary | ICD-10-CM | POA: Diagnosis not present

## 2015-11-02 NOTE — Telephone Encounter (Addendum)
Sorry about the delay in getting back to her - I think she needs to see pain management.  I am concerned about her taking anything too strong. She can try two tabs of her pain medication - it can cause confusion and increase her risk of falls so she needs to be very careful

## 2015-11-04 DIAGNOSIS — E119 Type 2 diabetes mellitus without complications: Secondary | ICD-10-CM | POA: Diagnosis not present

## 2015-11-04 DIAGNOSIS — Z7982 Long term (current) use of aspirin: Secondary | ICD-10-CM | POA: Diagnosis not present

## 2015-11-04 DIAGNOSIS — Z7984 Long term (current) use of oral hypoglycemic drugs: Secondary | ICD-10-CM | POA: Diagnosis not present

## 2015-11-04 DIAGNOSIS — M4806 Spinal stenosis, lumbar region: Secondary | ICD-10-CM | POA: Diagnosis not present

## 2015-11-05 DIAGNOSIS — Z7984 Long term (current) use of oral hypoglycemic drugs: Secondary | ICD-10-CM | POA: Diagnosis not present

## 2015-11-05 DIAGNOSIS — Z7982 Long term (current) use of aspirin: Secondary | ICD-10-CM | POA: Diagnosis not present

## 2015-11-05 DIAGNOSIS — E119 Type 2 diabetes mellitus without complications: Secondary | ICD-10-CM | POA: Diagnosis not present

## 2015-11-05 DIAGNOSIS — M4806 Spinal stenosis, lumbar region: Secondary | ICD-10-CM | POA: Diagnosis not present

## 2015-11-05 MED ORDER — HYDROCODONE-ACETAMINOPHEN 5-325 MG PO TABS: 1.0000 | ORAL_TABLET | ORAL | Status: DC | PRN

## 2015-11-05 NOTE — Addendum Note (Signed)
Addended by: Binnie Rail on: 11/05/2015 10:10 AM   Modules accepted: Orders

## 2015-11-05 NOTE — Telephone Encounter (Signed)
rx printed

## 2015-11-05 NOTE — Telephone Encounter (Signed)
Spoke with pts daughter. Pt is unable to get to the office to pick up RX, agreed that it would be best to mail to pt. She will inform pt as she gets confused easily. Do you want same script listing on bed list now?

## 2015-11-05 NOTE — Telephone Encounter (Signed)
RX placed in the mail

## 2015-11-07 DIAGNOSIS — Z7982 Long term (current) use of aspirin: Secondary | ICD-10-CM | POA: Diagnosis not present

## 2015-11-07 DIAGNOSIS — Z7984 Long term (current) use of oral hypoglycemic drugs: Secondary | ICD-10-CM | POA: Diagnosis not present

## 2015-11-07 DIAGNOSIS — E119 Type 2 diabetes mellitus without complications: Secondary | ICD-10-CM | POA: Diagnosis not present

## 2015-11-07 DIAGNOSIS — M5416 Radiculopathy, lumbar region: Secondary | ICD-10-CM | POA: Diagnosis not present

## 2015-11-07 DIAGNOSIS — M4806 Spinal stenosis, lumbar region: Secondary | ICD-10-CM | POA: Diagnosis not present

## 2015-11-08 DIAGNOSIS — Z7984 Long term (current) use of oral hypoglycemic drugs: Secondary | ICD-10-CM | POA: Diagnosis not present

## 2015-11-08 DIAGNOSIS — M4806 Spinal stenosis, lumbar region: Secondary | ICD-10-CM | POA: Diagnosis not present

## 2015-11-08 DIAGNOSIS — Z7982 Long term (current) use of aspirin: Secondary | ICD-10-CM | POA: Diagnosis not present

## 2015-11-08 DIAGNOSIS — E119 Type 2 diabetes mellitus without complications: Secondary | ICD-10-CM | POA: Diagnosis not present

## 2015-11-11 ENCOUNTER — Telehealth: Payer: Self-pay | Admitting: *Deleted

## 2015-11-11 DIAGNOSIS — Z7982 Long term (current) use of aspirin: Secondary | ICD-10-CM | POA: Diagnosis not present

## 2015-11-11 DIAGNOSIS — Z7984 Long term (current) use of oral hypoglycemic drugs: Secondary | ICD-10-CM | POA: Diagnosis not present

## 2015-11-11 DIAGNOSIS — M4806 Spinal stenosis, lumbar region: Secondary | ICD-10-CM | POA: Diagnosis not present

## 2015-11-11 DIAGNOSIS — E119 Type 2 diabetes mellitus without complications: Secondary | ICD-10-CM | POA: Diagnosis not present

## 2015-11-11 NOTE — Telephone Encounter (Signed)
ok 

## 2015-11-11 NOTE — Telephone Encounter (Signed)
Left msg on triage requesting verbal order to continue seeing pt 2 x a week for 1 wk. Planning on d/c pt for outpatient therapy...Johny Chess

## 2015-11-12 DIAGNOSIS — E119 Type 2 diabetes mellitus without complications: Secondary | ICD-10-CM | POA: Diagnosis not present

## 2015-11-12 DIAGNOSIS — Z7982 Long term (current) use of aspirin: Secondary | ICD-10-CM | POA: Diagnosis not present

## 2015-11-12 DIAGNOSIS — Z7984 Long term (current) use of oral hypoglycemic drugs: Secondary | ICD-10-CM | POA: Diagnosis not present

## 2015-11-12 DIAGNOSIS — M4806 Spinal stenosis, lumbar region: Secondary | ICD-10-CM | POA: Diagnosis not present

## 2015-11-12 NOTE — Telephone Encounter (Signed)
Notified Jim w/MD response../[email or web address] 

## 2015-11-14 DIAGNOSIS — M4806 Spinal stenosis, lumbar region: Secondary | ICD-10-CM | POA: Diagnosis not present

## 2015-11-14 DIAGNOSIS — Z7982 Long term (current) use of aspirin: Secondary | ICD-10-CM | POA: Diagnosis not present

## 2015-11-14 DIAGNOSIS — E119 Type 2 diabetes mellitus without complications: Secondary | ICD-10-CM | POA: Diagnosis not present

## 2015-11-14 DIAGNOSIS — Z7984 Long term (current) use of oral hypoglycemic drugs: Secondary | ICD-10-CM | POA: Diagnosis not present

## 2015-11-15 DIAGNOSIS — Z7984 Long term (current) use of oral hypoglycemic drugs: Secondary | ICD-10-CM | POA: Diagnosis not present

## 2015-11-15 DIAGNOSIS — M4806 Spinal stenosis, lumbar region: Secondary | ICD-10-CM | POA: Diagnosis not present

## 2015-11-15 DIAGNOSIS — E119 Type 2 diabetes mellitus without complications: Secondary | ICD-10-CM | POA: Diagnosis not present

## 2015-11-15 DIAGNOSIS — Z7982 Long term (current) use of aspirin: Secondary | ICD-10-CM | POA: Diagnosis not present

## 2015-11-18 DIAGNOSIS — E119 Type 2 diabetes mellitus without complications: Secondary | ICD-10-CM | POA: Diagnosis not present

## 2015-11-18 DIAGNOSIS — Z7984 Long term (current) use of oral hypoglycemic drugs: Secondary | ICD-10-CM | POA: Diagnosis not present

## 2015-11-18 DIAGNOSIS — M4806 Spinal stenosis, lumbar region: Secondary | ICD-10-CM | POA: Diagnosis not present

## 2015-11-18 DIAGNOSIS — Z7982 Long term (current) use of aspirin: Secondary | ICD-10-CM | POA: Diagnosis not present

## 2015-11-19 DIAGNOSIS — E119 Type 2 diabetes mellitus without complications: Secondary | ICD-10-CM | POA: Diagnosis not present

## 2015-11-19 DIAGNOSIS — M4806 Spinal stenosis, lumbar region: Secondary | ICD-10-CM | POA: Diagnosis not present

## 2015-11-19 DIAGNOSIS — Z7984 Long term (current) use of oral hypoglycemic drugs: Secondary | ICD-10-CM | POA: Diagnosis not present

## 2015-11-19 DIAGNOSIS — Z7982 Long term (current) use of aspirin: Secondary | ICD-10-CM | POA: Diagnosis not present

## 2015-11-20 DIAGNOSIS — Z7982 Long term (current) use of aspirin: Secondary | ICD-10-CM | POA: Diagnosis not present

## 2015-11-20 DIAGNOSIS — E119 Type 2 diabetes mellitus without complications: Secondary | ICD-10-CM | POA: Diagnosis not present

## 2015-11-20 DIAGNOSIS — M4806 Spinal stenosis, lumbar region: Secondary | ICD-10-CM | POA: Diagnosis not present

## 2015-11-20 DIAGNOSIS — Z7984 Long term (current) use of oral hypoglycemic drugs: Secondary | ICD-10-CM | POA: Diagnosis not present

## 2015-11-22 DIAGNOSIS — M533 Sacrococcygeal disorders, not elsewhere classified: Secondary | ICD-10-CM | POA: Diagnosis not present

## 2015-12-03 DIAGNOSIS — M5137 Other intervertebral disc degeneration, lumbosacral region: Secondary | ICD-10-CM | POA: Diagnosis not present

## 2015-12-03 DIAGNOSIS — M4696 Unspecified inflammatory spondylopathy, lumbar region: Secondary | ICD-10-CM | POA: Diagnosis not present

## 2015-12-03 DIAGNOSIS — G894 Chronic pain syndrome: Secondary | ICD-10-CM | POA: Diagnosis not present

## 2015-12-03 DIAGNOSIS — M47817 Spondylosis without myelopathy or radiculopathy, lumbosacral region: Secondary | ICD-10-CM | POA: Diagnosis not present

## 2015-12-03 DIAGNOSIS — Z79899 Other long term (current) drug therapy: Secondary | ICD-10-CM | POA: Diagnosis not present

## 2015-12-03 DIAGNOSIS — Z79891 Long term (current) use of opiate analgesic: Secondary | ICD-10-CM | POA: Diagnosis not present

## 2015-12-11 ENCOUNTER — Encounter: Payer: Self-pay | Admitting: Neurology

## 2015-12-11 ENCOUNTER — Ambulatory Visit (INDEPENDENT_AMBULATORY_CARE_PROVIDER_SITE_OTHER): Payer: Medicare Other | Admitting: Neurology

## 2015-12-11 ENCOUNTER — Other Ambulatory Visit (INDEPENDENT_AMBULATORY_CARE_PROVIDER_SITE_OTHER): Payer: Medicare Other

## 2015-12-11 VITALS — BP 142/76 | HR 77 | Temp 98.5°F | Ht 59.0 in | Wt 153.4 lb

## 2015-12-11 DIAGNOSIS — I10 Essential (primary) hypertension: Secondary | ICD-10-CM

## 2015-12-11 DIAGNOSIS — E119 Type 2 diabetes mellitus without complications: Secondary | ICD-10-CM

## 2015-12-11 DIAGNOSIS — E785 Hyperlipidemia, unspecified: Secondary | ICD-10-CM

## 2015-12-11 DIAGNOSIS — G3184 Mild cognitive impairment, so stated: Secondary | ICD-10-CM | POA: Diagnosis not present

## 2015-12-11 LAB — TSH: TSH: 1.22 u[IU]/mL (ref 0.35–4.50)

## 2015-12-11 LAB — VITAMIN B12: Vitamin B-12: 887 pg/mL (ref 211–911)

## 2015-12-11 MED ORDER — DONEPEZIL HCL 10 MG PO TABS: ORAL_TABLET | ORAL | 11 refills | Status: DC

## 2015-12-11 NOTE — Patient Instructions (Signed)
1. Start Aricept 10mg : take 1/2 tablet daily for 1 month, then increase to 1 tablet daily 2. Bloodwork for TSH, B12 3. Control of blood pressure, cholesterol, diabetes, as well as physical exercise and brain stimulation exercises are important for brain health 4. Follow-up in 6 months, call for any changes
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
## 2015-12-12 DIAGNOSIS — Z79891 Long term (current) use of opiate analgesic: Secondary | ICD-10-CM | POA: Diagnosis not present

## 2015-12-12 DIAGNOSIS — Z79899 Other long term (current) drug therapy: Secondary | ICD-10-CM | POA: Diagnosis not present

## 2015-12-12 DIAGNOSIS — G3184 Mild cognitive impairment, so stated: Secondary | ICD-10-CM | POA: Insufficient documentation

## 2015-12-12 DIAGNOSIS — G894 Chronic pain syndrome: Secondary | ICD-10-CM | POA: Diagnosis not present

## 2015-12-12 DIAGNOSIS — M47817 Spondylosis without myelopathy or radiculopathy, lumbosacral region: Secondary | ICD-10-CM | POA: Diagnosis not present

## 2015-12-20 ENCOUNTER — Encounter: Payer: Self-pay | Admitting: Internal Medicine

## 2015-12-24 DIAGNOSIS — M5417 Radiculopathy, lumbosacral region: Secondary | ICD-10-CM | POA: Diagnosis not present

## 2015-12-26 ENCOUNTER — Other Ambulatory Visit: Payer: Self-pay | Admitting: Internal Medicine

## 2015-12-30 ENCOUNTER — Ambulatory Visit (INDEPENDENT_AMBULATORY_CARE_PROVIDER_SITE_OTHER): Payer: Medicare Other | Admitting: Internal Medicine

## 2015-12-30 ENCOUNTER — Encounter: Payer: Self-pay | Admitting: Internal Medicine

## 2015-12-30 VITALS — BP 144/80 | HR 65 | Temp 98.0°F | Resp 16 | Wt 148.0 lb

## 2015-12-30 DIAGNOSIS — M4806 Spinal stenosis, lumbar region: Secondary | ICD-10-CM

## 2015-12-30 DIAGNOSIS — E1140 Type 2 diabetes mellitus with diabetic neuropathy, unspecified: Secondary | ICD-10-CM | POA: Diagnosis not present

## 2015-12-30 DIAGNOSIS — Z23 Encounter for immunization: Secondary | ICD-10-CM

## 2015-12-30 DIAGNOSIS — I2510 Atherosclerotic heart disease of native coronary artery without angina pectoris: Secondary | ICD-10-CM

## 2015-12-30 DIAGNOSIS — M48061 Spinal stenosis, lumbar region without neurogenic claudication: Secondary | ICD-10-CM

## 2015-12-30 DIAGNOSIS — I10 Essential (primary) hypertension: Secondary | ICD-10-CM

## 2015-12-30 DIAGNOSIS — F32A Depression, unspecified: Secondary | ICD-10-CM

## 2015-12-30 DIAGNOSIS — K219 Gastro-esophageal reflux disease without esophagitis: Secondary | ICD-10-CM | POA: Diagnosis not present

## 2015-12-30 DIAGNOSIS — F329 Major depressive disorder, single episode, unspecified: Secondary | ICD-10-CM

## 2015-12-30 DIAGNOSIS — G3184 Mild cognitive impairment, so stated: Secondary | ICD-10-CM

## 2015-12-30 NOTE — Assessment & Plan Note (Addendum)
Controlled with diet Has lost some weight Will restart checking sugars again

## 2015-12-30 NOTE — Assessment & Plan Note (Signed)
No chest pain Continue aspirin and statin Following with cardiology annually

## 2015-12-30 NOTE — Progress Notes (Signed)
Subjective:    Patient ID: [name], female    DOB: 01-06-1931, 82 y.o.   MRN: [id number]  HPI She is here for follow up.  Diabetes: She is controlling her diabetes with diet. She is compliant with a diabetic diet. She is not exercising regularly. She has not been monitoring her sugars regularly, but didn't request new supplies and will start monitoring them. She checks her feet daily and denies foot lesions. She is up-to-date with an ophthalmology examination.   Hyperlipidemia: She is taking her medication daily. She is compliant with a low fat/cholesterol diet. She is not exercising regularly. She denies myalgias.   GERD:  She is taking her medication daily as prescribed.  She denies any GERD symptoms and feels her GERD is well controlled.   .Hypertension: She is taking her medication daily. She is compliant with a low sodium diet.  She denies chest pain, edema, shortness of breath and regular headaches. She is not exercising regularly.  She does not monitor her blood pressure at home.    Back pain:  She is following with pain management.  She did have injections and is taking a new medication and feels much better. Her pain is much more manageable and she would like to start back with regular exercise in the pool.  Depression: She is taking her medication daily as prescribed. She denies any side effects from the medication. She feels her depression is well controlled and she is happy with her current dose of medication.    Medications and allergies reviewed with patient and updated if appropriate.  Patient Active Problem List   Diagnosis Date Noted  . Mild cognitive impairment 12/12/2015  . Abdominal pain, epigastric 09/02/2015  . AP (abdominal pain) 09/02/2015  . Right knee pain 06/14/2015  . Cough 05/02/2015  . Diabetic neuropathy ([place]) 03/21/2014  . Bunion 03/21/2014  . PVD (pulmonary valve disease) 01/23/2014  . Onychomycosis 12/20/2013  . PVD (peripheral vascular  disease) ([place]) 12/19/2013  . Gout 12/13/2013  . Depression   . Spinal stenosis of lumbar region 04/24/2013  . Arthritis of shoulder region, left 09/11/2011  . Memory loss 07/23/2010  . Type 2 diabetes mellitus ([place]) 01/17/2009  . Anemia 10/15/2008  . CAD, NATIVE VESSEL 06/09/2008  . Hyperlipidemia 06/15/2007  . Essential hypertension 06/15/2007  . GERD 06/15/2007  . Fibromyalgia syndrome 06/15/2007  . 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 DIVERTICULOSIS, 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    Current Outpatient Prescriptions on File Prior to Visit  Medication Sig Dispense Refill  . amLODipine (NORVASC) 5 MG tablet Take 1 tablet (5 mg total) by mouth 2 (two) times daily. 180 tablet 3  . aspirin EC 81 MG tablet Take 81 mg by mouth daily.    Marland Kitchen atorvastatin (LIPITOR) 40 MG tablet Take 1 tablet (40 mg total) by mouth daily. 90 tablet 3  . carvedilol (COREG) 6.25 MG tablet Take 1 tablet (6.25 mg total) by mouth 2 (two) times daily with a meal. 180 tablet 3  . cetirizine (ZYRTEC) 10 MG tablet Take 1 tablet (10 mg total) by mouth daily. 30 tablet 11  . docusate sodium (COLACE) 100 MG capsule Take 2 capsules (200 mg total) by mouth daily. (Patient taking differently: Take 100 mg by mouth 2 (two) times daily. ) 60 capsule 5  . donepezil (ARICEPT) 10 MG tablet Take 1/2 tablet daily for 1 month, then increase to 1 tablet daily 30 tablet 11  . DULoxetine (CYMBALTA) 30 MG capsule TAKE 1 CAPSULE (  30 MG TOTAL) BY MOUTH DAILY. 30 capsule 5  . gabapentin (NEURONTIN) 100 MG capsule Take 1 capsule (100 mg total) by mouth 2 (two) times daily. (Patient taking differently: Take 100 mg by mouth daily. ) 180 capsule 1  . gabapentin (NEURONTIN) 300 MG capsule Take 1 capsule (300 mg total) by mouth at bedtime. To take in addition to the daytime dosing (Patient taking differently: Take 600 mg by mouth at bedtime. To take in addition to the daytime dosing) 180 capsule 1  . HYDROcodone-acetaminophen (NORCO/VICODIN) 5-325 MG tablet  Take 1-2 tablets by mouth every 4 (four) hours as needed. 60 tablet 0  . meloxicam (MOBIC) 7.5 MG tablet Take 1 tablet (7.5 mg total) by mouth daily. 90 tablet 3  . omeprazole (PRILOSEC) 40 MG capsule Take 1 capsule (40 mg total) by mouth daily. 90 capsule 3  . ramipril (ALTACE) 10 MG capsule Take 1 capsule (10 mg total) by mouth daily. 90 capsule 3  . TIZANIDINE HCL PO Take by mouth.     No current facility-administered medications on file prior to visit.     Past Medical History:  Diagnosis Date  . CAD, NATIVE VESSEL 06/09/2008  . DIABETES MELLITUS, TYPE II   . FIBROMYALGIA   . GERD   . Gout   . HIATAL HERNIA   . HYPERLIPIDEMIA   . HYPERTENSION   . Insomnia   . OA (osteoarthritis)    severe, shoulders, hands - inflammatory, ?RA  . S/P CABG x 1   . S/P inguinal hernia repair   . TUBULOVILLOUS ADENOMA, 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   colo 04/2012 - no polyps - no further screening planned (age >59)  . UNSPECIFIED ANEMIA     Past Surgical History:  Procedure Laterality Date  . ABDOMINAL HYSTERECTOMY  1981  . APPENDECTOMY    . CATARACT EXTRACTION, BILATERAL    . CORONARY ARTERY BYPASS GRAFT  2010  . HERNIA REPAIR     INGUINAL  . JOINT REPLACEMENT    . OVARIAN CYST REMOVAL    . REPLACEMENT TOTAL KNEE BILATERAL Bilateral    Rt-1974, Lt -1989  . REVERSE SHOULDER ARTHROPLASTY  09/08/2011   Procedure: REVERSE SHOULDER ARTHROPLASTY;  Surgeon: Nita Sells, MD;  Location: [facility];  Service: Orthopedics;  Laterality: Left;  left reverse total shoulder arthroplasty  . ROTATOR CUFF REPAIR     Left    Social History   Social History  . Marital status: Widowed    Spouse name: N/A  . Number of children: N/A  . Years of education: N/A   Social History Main Topics  . Smoking status: Former Smoker    Quit date: 04/26/1970  . Smokeless tobacco: Never Used  . Alcohol use 0.6 oz/week    1 Glasses of wine per week  . Drug use: No  . Sexual activity: Not Asked   Other Topics Concern    . 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 Cancer Mother     Stomach  . Stomach cancer Mother 32  . Hypertension Mother   . Heart disease Father 51    MI  . Diabetes Father   . Heart attack Father   . Diabetes Daughter   . Hypertension Daughter   . Vascular Disease Sister   . Stroke Sister   . Dementia Sister   . Diabetes Sister   .  Parkinsonism Sister   . Stroke Sister   . Heart disease Sister   . Heart attack Brother   . Anesthesia problems Neg Hx   . Colon cancer Neg Hx     Review of Systems  Constitutional: Negative for appetite change and fever.  Respiratory: Positive for cough (in morning only). Negative for shortness of breath and wheezing.   Cardiovascular: Positive for palpitations (occasional). Negative for chest pain and leg swelling.  Neurological: Negative for dizziness, light-headedness and headaches.       Objective:   Vitals:   12/30/15 1541  BP: (!) 144/80  Pulse: 65  Resp: 16  Temp: 98 F (36.7 C)   Filed Weights   12/30/15 1541  Weight: 148 lb (67.1 kg)   Body mass index is 29.89 kg/m.   Physical Exam Constitutional: Appears well-developed and well-nourished. No distress.  HENT:  Head: Normocephalic and atraumatic.  Neck: Neck supple. No tracheal deviation present. No thyromegaly present.  Cardiovascular: Normal rate, regular rhythm and normal heart sounds.   2/6 systolic murmur heard. No carotid bruit  Pulmonary/Chest: Effort normal and breath sounds normal. No respiratory distress. No has no wheezes. No rales.  Musculoskeletal: No edema.  Lymphadenopathy: No cervical adenopathy.  Skin: Skin is warm and dry. Not diaphoretic.  Psychiatric: Normal mood and affect. Behavior is normal.       Assessment & Plan:   See Problem List for Assessment and Plan of chronic medical problems.   F/u in 3 months

## 2015-12-30 NOTE — Assessment & Plan Note (Signed)
GERD controlled Continue daily medication  

## 2015-12-30 NOTE — Assessment & Plan Note (Signed)
Improved pain with injections Taking gabapentin, tizanidine and meloxicam Plans to restart regular exercise

## 2015-12-30 NOTE — Assessment & Plan Note (Signed)
Following with neurology Started on Aricept, which she is tolerating

## 2015-12-30 NOTE — Progress Notes (Signed)
Pre visit review using our clinic review tool, if applicable. No additional management support is needed unless otherwise documented below in the visit note. 

## 2015-12-30 NOTE — Patient Instructions (Addendum)
Your sugars are well controlled.  Your last a1c was 6.4%   All other Health Maintenance issues reviewed.   All recommended immunizations and age-appropriate screenings are up-to-date or discussed.  Flu vaccine administered today.   Medications reviewed and updated.  No changes recommended at this time.  Your prescription(s) have been submitted to your pharmacy. Please take as directed and contact our office if you believe you are having problem(s) with the medication(s).    Please followup in 3 months

## 2015-12-30 NOTE — Assessment & Plan Note (Signed)
Controlled, stable Continue current dose of medication  

## 2015-12-30 NOTE — Assessment & Plan Note (Addendum)
BP Readings from Last 3 Encounters:  12/30/15 (!) 144/80  12/11/15 (!) 142/76  10/15/15 151/57   Blood pressure slightly elevated She has lost weight and will continue to work on weight loss No blood pressure elevated at her next visit we'll need to adjust medication

## 2016-01-02 DIAGNOSIS — E669 Obesity, unspecified: Secondary | ICD-10-CM | POA: Diagnosis not present

## 2016-01-02 DIAGNOSIS — Z79891 Long term (current) use of opiate analgesic: Secondary | ICD-10-CM | POA: Diagnosis not present

## 2016-01-02 DIAGNOSIS — M4696 Unspecified inflammatory spondylopathy, lumbar region: Secondary | ICD-10-CM | POA: Diagnosis not present

## 2016-01-02 DIAGNOSIS — Z79899 Other long term (current) drug therapy: Secondary | ICD-10-CM | POA: Diagnosis not present

## 2016-01-02 DIAGNOSIS — M5137 Other intervertebral disc degeneration, lumbosacral region: Secondary | ICD-10-CM | POA: Diagnosis not present

## 2016-01-02 DIAGNOSIS — M47817 Spondylosis without myelopathy or radiculopathy, lumbosacral region: Secondary | ICD-10-CM | POA: Diagnosis not present

## 2016-01-02 DIAGNOSIS — G894 Chronic pain syndrome: Secondary | ICD-10-CM | POA: Diagnosis not present

## 2016-01-06 ENCOUNTER — Encounter: Payer: Self-pay | Admitting: Internal Medicine

## 2016-01-06 ENCOUNTER — Ambulatory Visit (INDEPENDENT_AMBULATORY_CARE_PROVIDER_SITE_OTHER): Payer: Medicare Other | Admitting: Internal Medicine

## 2016-01-06 VITALS — BP 154/78 | HR 68 | Ht 59.0 in | Wt 147.8 lb

## 2016-01-06 DIAGNOSIS — E785 Hyperlipidemia, unspecified: Secondary | ICD-10-CM

## 2016-01-06 DIAGNOSIS — I25810 Atherosclerosis of coronary artery bypass graft(s) without angina pectoris: Secondary | ICD-10-CM | POA: Diagnosis not present
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
## 2016-01-06 NOTE — Patient Instructions (Signed)
Your physician recommends that you continue on your current medications as directed. Please refer to the Current Medication list given to you today.  Your physician wants you to follow-up in: July, 2018 with Dr. Ross.  You will receive a reminder letter in the mail two months in advance. If you don't receive a letter, please call our office to schedule the follow-up appointment.  

## 2016-01-13 ENCOUNTER — Other Ambulatory Visit: Payer: Self-pay | Admitting: Internal Medicine

## 2016-01-13 DIAGNOSIS — Z1231 Encounter for screening mammogram for malignant neoplasm of breast: Secondary | ICD-10-CM

## 2016-01-16 ENCOUNTER — Telehealth: Payer: Self-pay | Admitting: Internal Medicine

## 2016-01-16 NOTE — Telephone Encounter (Signed)
Please advise 

## 2016-01-16 NOTE — Telephone Encounter (Signed)
Ok continue PT

## 2016-01-16 NOTE — Telephone Encounter (Signed)
Patient called saying she wants to continue PT with  [facility] [phone]. She needs [place] to call them and say it is ok for her to continue. Patient would like you to follow up with her once you have called. Thank you.

## 2016-01-17 NOTE — Telephone Encounter (Signed)
Spoke with [name], informed it was okay per MD to continue PT. Pt has been contacted and made aware.

## 2016-01-20 DIAGNOSIS — H35023 Exudative retinopathy, bilateral: Secondary | ICD-10-CM | POA: Diagnosis not present

## 2016-01-20 NOTE — Telephone Encounter (Signed)
Patient states this is to be sent to [facility].  Patient states script needs to be faxed to [phone]

## 2016-01-20 NOTE — Telephone Encounter (Signed)
Please advise, will need script to fax.

## 2016-01-21 ENCOUNTER — Telehealth: Payer: Self-pay | Admitting: Emergency Medicine

## 2016-01-21 DIAGNOSIS — M25552 Pain in left hip: Secondary | ICD-10-CM

## 2016-01-21 DIAGNOSIS — M25562 Pain in left knee: Secondary | ICD-10-CM

## 2016-01-21 NOTE — Telephone Encounter (Signed)
ordered

## 2016-01-21 NOTE — Telephone Encounter (Signed)
rx written

## 2016-01-21 NOTE — Addendum Note (Signed)
Addended by: Binnie Rail on: 01/21/2016 09:16 PM   Modules accepted: Orders

## 2016-01-21 NOTE — Telephone Encounter (Signed)
Pt called and needs a referral to [facility] because she fell and is having pain in her left knee and left hip. She needs the referral for them to operate on them. Please advise thanks.

## 2016-01-21 NOTE — Telephone Encounter (Signed)
Please advise 

## 2016-01-21 NOTE — Telephone Encounter (Signed)
Orders have been faxed to [facility]

## 2016-01-22 NOTE — Telephone Encounter (Signed)
Spoke with pt to inform.  

## 2016-01-23 DIAGNOSIS — M47817 Spondylosis without myelopathy or radiculopathy, lumbosacral region: Secondary | ICD-10-CM | POA: Diagnosis not present

## 2016-02-06 DIAGNOSIS — M47817 Spondylosis without myelopathy or radiculopathy, lumbosacral region: Secondary | ICD-10-CM | POA: Diagnosis not present

## 2016-02-06 DIAGNOSIS — Z79891 Long term (current) use of opiate analgesic: Secondary | ICD-10-CM | POA: Diagnosis not present

## 2016-02-06 DIAGNOSIS — M4696 Unspecified inflammatory spondylopathy, lumbar region: Secondary | ICD-10-CM | POA: Diagnosis not present

## 2016-02-06 DIAGNOSIS — Z79899 Other long term (current) drug therapy: Secondary | ICD-10-CM | POA: Diagnosis not present

## 2016-02-06 DIAGNOSIS — M1288 Other specific arthropathies, not elsewhere classified, other specified site: Secondary | ICD-10-CM | POA: Diagnosis not present

## 2016-02-06 DIAGNOSIS — M5137 Other intervertebral disc degeneration, lumbosacral region: Secondary | ICD-10-CM | POA: Diagnosis not present

## 2016-02-06 DIAGNOSIS — G894 Chronic pain syndrome: Secondary | ICD-10-CM | POA: Diagnosis not present

## 2016-02-18 DIAGNOSIS — M7062 Trochanteric bursitis, left hip: Secondary | ICD-10-CM | POA: Diagnosis not present

## 2016-02-18 DIAGNOSIS — M25552 Pain in left hip: Secondary | ICD-10-CM | POA: Diagnosis not present

## 2016-02-24 ENCOUNTER — Telehealth: Payer: Self-pay | Admitting: Internal Medicine

## 2016-02-24 NOTE — Telephone Encounter (Signed)
Pt called in ands said that she has a few question about her meds.  Can you call her when you get a chance?

## 2016-02-24 NOTE — Telephone Encounter (Signed)
Pt would like to know if she can increase her Meloxicam to 2 pills daily, states her arthritis pain has increased and does not want to take more Hydrocodone unless absolutely needed. Please advise

## 2016-02-25 DIAGNOSIS — M7072 Other bursitis of hip, left hip: Secondary | ICD-10-CM | POA: Diagnosis not present

## 2016-02-25 DIAGNOSIS — M25552 Pain in left hip: Secondary | ICD-10-CM | POA: Diagnosis not present

## 2016-02-25 DIAGNOSIS — R262 Difficulty in walking, not elsewhere classified: Secondary | ICD-10-CM | POA: Diagnosis not present

## 2016-02-25 DIAGNOSIS — M7062 Trochanteric bursitis, left hip: Secondary | ICD-10-CM | POA: Diagnosis not present

## 2016-02-25 NOTE — Telephone Encounter (Signed)
Yes, she can take two in one day.  There is an increased risk of bleeding- she should always take the medication with food.

## 2016-02-26 NOTE — Telephone Encounter (Signed)
Spoke with pt to inform.  

## 2016-02-27 DIAGNOSIS — M7062 Trochanteric bursitis, left hip: Secondary | ICD-10-CM | POA: Diagnosis not present

## 2016-02-27 DIAGNOSIS — M25552 Pain in left hip: Secondary | ICD-10-CM | POA: Diagnosis not present

## 2016-02-27 DIAGNOSIS — M7072 Other bursitis of hip, left hip: Secondary | ICD-10-CM | POA: Diagnosis not present

## 2016-02-27 DIAGNOSIS — R262 Difficulty in walking, not elsewhere classified: Secondary | ICD-10-CM | POA: Diagnosis not present

## 2016-03-03 DIAGNOSIS — R262 Difficulty in walking, not elsewhere classified: Secondary | ICD-10-CM | POA: Diagnosis not present

## 2016-03-03 DIAGNOSIS — M7072 Other bursitis of hip, left hip: Secondary | ICD-10-CM | POA: Diagnosis not present

## 2016-03-03 DIAGNOSIS — M25552 Pain in left hip: Secondary | ICD-10-CM | POA: Diagnosis not present

## 2016-03-03 DIAGNOSIS — M7062 Trochanteric bursitis, left hip: Secondary | ICD-10-CM | POA: Diagnosis not present

## 2016-03-05 DIAGNOSIS — M25552 Pain in left hip: Secondary | ICD-10-CM | POA: Diagnosis not present

## 2016-03-05 DIAGNOSIS — R262 Difficulty in walking, not elsewhere classified: Secondary | ICD-10-CM | POA: Diagnosis not present

## 2016-03-05 DIAGNOSIS — M7072 Other bursitis of hip, left hip: Secondary | ICD-10-CM | POA: Diagnosis not present

## 2016-03-05 DIAGNOSIS — M7062 Trochanteric bursitis, left hip: Secondary | ICD-10-CM | POA: Diagnosis not present

## 2016-03-09 DIAGNOSIS — R262 Difficulty in walking, not elsewhere classified: Secondary | ICD-10-CM | POA: Diagnosis not present

## 2016-03-09 DIAGNOSIS — M25552 Pain in left hip: Secondary | ICD-10-CM | POA: Diagnosis not present

## 2016-03-09 DIAGNOSIS — M7072 Other bursitis of hip, left hip: Secondary | ICD-10-CM | POA: Diagnosis not present

## 2016-03-09 DIAGNOSIS — M7062 Trochanteric bursitis, left hip: Secondary | ICD-10-CM | POA: Diagnosis not present

## 2016-03-10 DIAGNOSIS — M545 Low back pain: Secondary | ICD-10-CM | POA: Diagnosis not present

## 2016-03-10 DIAGNOSIS — M25551 Pain in right hip: Secondary | ICD-10-CM | POA: Diagnosis not present

## 2016-03-10 DIAGNOSIS — M5441 Lumbago with sciatica, right side: Secondary | ICD-10-CM | POA: Diagnosis not present

## 2016-03-11 DIAGNOSIS — R262 Difficulty in walking, not elsewhere classified: Secondary | ICD-10-CM | POA: Diagnosis not present

## 2016-03-11 DIAGNOSIS — M7072 Other bursitis of hip, left hip: Secondary | ICD-10-CM | POA: Diagnosis not present

## 2016-03-11 DIAGNOSIS — M25552 Pain in left hip: Secondary | ICD-10-CM | POA: Diagnosis not present

## 2016-03-11 DIAGNOSIS — M7062 Trochanteric bursitis, left hip: Secondary | ICD-10-CM | POA: Diagnosis not present

## 2016-03-16 DIAGNOSIS — M7062 Trochanteric bursitis, left hip: Secondary | ICD-10-CM | POA: Diagnosis not present

## 2016-03-16 DIAGNOSIS — M25552 Pain in left hip: Secondary | ICD-10-CM | POA: Diagnosis not present

## 2016-03-16 DIAGNOSIS — M7072 Other bursitis of hip, left hip: Secondary | ICD-10-CM | POA: Diagnosis not present

## 2016-03-16 DIAGNOSIS — R262 Difficulty in walking, not elsewhere classified: Secondary | ICD-10-CM | POA: Diagnosis not present

## 2016-03-23 ENCOUNTER — Other Ambulatory Visit: Payer: Self-pay | Admitting: Internal Medicine

## 2016-03-23 DIAGNOSIS — I25810 Atherosclerosis of coronary artery bypass graft(s) without angina pectoris: Secondary | ICD-10-CM

## 2016-03-23 DIAGNOSIS — E785 Hyperlipidemia, unspecified: Secondary | ICD-10-CM

## 2016-03-26 DIAGNOSIS — R262 Difficulty in walking, not elsewhere classified: Secondary | ICD-10-CM | POA: Diagnosis not present

## 2016-03-26 DIAGNOSIS — M25551 Pain in right hip: Secondary | ICD-10-CM | POA: Diagnosis not present

## 2016-03-30 DIAGNOSIS — M25551 Pain in right hip: Secondary | ICD-10-CM | POA: Diagnosis not present

## 2016-03-30 DIAGNOSIS — R262 Difficulty in walking, not elsewhere classified: Secondary | ICD-10-CM | POA: Diagnosis not present

## 2016-04-01 ENCOUNTER — Other Ambulatory Visit: Payer: Self-pay | Admitting: Internal Medicine

## 2016-04-01 DIAGNOSIS — M25551 Pain in right hip: Secondary | ICD-10-CM | POA: Diagnosis not present

## 2016-04-01 DIAGNOSIS — Z79899 Other long term (current) drug therapy: Secondary | ICD-10-CM | POA: Diagnosis not present

## 2016-04-01 DIAGNOSIS — Z79891 Long term (current) use of opiate analgesic: Secondary | ICD-10-CM | POA: Diagnosis not present

## 2016-04-01 DIAGNOSIS — G894 Chronic pain syndrome: Secondary | ICD-10-CM | POA: Diagnosis not present

## 2016-04-01 DIAGNOSIS — M5137 Other intervertebral disc degeneration, lumbosacral region: Secondary | ICD-10-CM | POA: Diagnosis not present

## 2016-04-01 DIAGNOSIS — M47817 Spondylosis without myelopathy or radiculopathy, lumbosacral region: Secondary | ICD-10-CM | POA: Diagnosis not present

## 2016-04-01 DIAGNOSIS — R262 Difficulty in walking, not elsewhere classified: Secondary | ICD-10-CM | POA: Diagnosis not present

## 2016-04-01 DIAGNOSIS — M533 Sacrococcygeal disorders, not elsewhere classified: Secondary | ICD-10-CM | POA: Diagnosis not present

## 2016-04-02 ENCOUNTER — Ambulatory Visit: Payer: Medicare Other | Admitting: Internal Medicine

## 2016-04-02 ENCOUNTER — Ambulatory Visit
Admission: RE | Admit: 2016-04-02 | Discharge: 2016-04-02 | Disposition: A | Discharge status: Home / Self Care | Payer: Medicare Other | Source: Ambulatory Visit | Attending: Internal Medicine | Admitting: Internal Medicine

## 2016-04-02 DIAGNOSIS — Z1231 Encounter for screening mammogram for malignant neoplasm of breast: Secondary | ICD-10-CM | POA: Diagnosis not present

## 2016-04-02 NOTE — Progress Notes (Deleted)
Subjective:    Patient ID: [name], female    DOB: July 02, 1930, 82 y.o.   MRN: [id number]  HPI The patient is here for follow up.  Diabetes: She is taking her medication daily as prescribed. She is compliant with a diabetic diet. She is exercising regularly. She monitors her sugars and they have been running XXX. She checks her feet daily and denies foot lesions. She is up-to-date with an ophthalmology examination.   Hypertension: She is taking her medication daily. She is compliant with a low sodium diet.  She denies chest pain, palpitations, edema, shortness of breath and regular headaches. She is exercising regularly.  She does not monitor her blood pressure at home.     Medications and allergies reviewed with patient and updated if appropriate.  Patient Active Problem List   Diagnosis Date Noted  . Mild cognitive impairment 12/12/2015  . Right knee pain 06/14/2015  . Cough 05/02/2015  . Diabetic neuropathy ([place]) 03/21/2014  . Bunion 03/21/2014  . PVD (pulmonary valve disease) 01/23/2014  . Onychomycosis 12/20/2013  . PVD (peripheral vascular disease) ([place]) 12/19/2013  . Gout 12/13/2013  . Depression   . Spinal stenosis of lumbar region 04/24/2013  . Arthritis of shoulder region, left 09/11/2011  . Type 2 diabetes mellitus ([place]) 01/17/2009  . Anemia 10/15/2008  . CAD, NATIVE VESSEL 06/09/2008  . Hyperlipidemia 06/15/2007  . Essential hypertension 06/15/2007  . GERD 06/15/2007  . Fibromyalgia syndrome 06/15/2007  . 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 DIVERTICULOSIS, 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    Current Outpatient Prescriptions on File Prior to Visit  Medication Sig Dispense Refill  . amLODipine (NORVASC) 5 MG tablet Take 1 tablet (5 mg total) by mouth 2 (two) times daily. 180 tablet 3  . aspirin EC 81 MG tablet Take 81 mg by mouth daily.    Marland Kitchen atorvastatin (LIPITOR) 40 MG tablet TAKE 1 TABLET (40 MG TOTAL) BY MOUTH DAILY. 90 tablet 2  . carvedilol (COREG) 6.25  MG tablet Take 1 tablet (6.25 mg total) by mouth 2 (two) times daily with a meal. 180 tablet 3  . cetirizine (ZYRTEC) 10 MG tablet Take 1 tablet (10 mg total) by mouth daily. 30 tablet 11  . docusate sodium (COLACE) 100 MG capsule Take 2 capsules (200 mg total) by mouth daily. (Patient taking differently: Take 100 mg by mouth 2 (two) times daily. ) 60 capsule 5  . donepezil (ARICEPT) 10 MG tablet Take 1/2 tablet daily for 1 month, then increase to 1 tablet daily 30 tablet 11  . DULoxetine (CYMBALTA) 30 MG capsule TAKE 1 CAPSULE (30 MG TOTAL) BY MOUTH DAILY. 30 capsule 5  . gabapentin (NEURONTIN) 100 MG capsule Take 1 capsule (100 mg total) by mouth 2 (two) times daily. (Patient taking differently: Take 100 mg by mouth daily. ) 180 capsule 1  . gabapentin (NEURONTIN) 300 MG capsule Take 1 capsule (300 mg total) by mouth at bedtime. To take in addition to the daytime dosing (Patient taking differently: Take 600 mg by mouth at bedtime. To take in addition to the daytime dosing) 180 capsule 1  . HYDROcodone-acetaminophen (NORCO/VICODIN) 5-325 MG tablet Take 1-2 tablets by mouth every 4 (four) hours as needed. 60 tablet 0  . meloxicam (MOBIC) 7.5 MG tablet Take 1 tablet (7.5 mg total) by mouth daily. 90 tablet 3  . omeprazole (PRILOSEC) 40 MG capsule Take 1 capsule (40 mg total) by mouth daily. 90 capsule 3  . ramipril (ALTACE) 10 MG capsule TAKE  1 CAPSULE (10 MG TOTAL) BY MOUTH DAILY. 90 capsule 2  . TIZANIDINE HCL PO Take by mouth.     No current facility-administered medications on file prior to visit.     Past Medical History:  Diagnosis Date  . CAD, NATIVE VESSEL 06/09/2008  . DIABETES MELLITUS, TYPE II   . FIBROMYALGIA   . GERD   . Gout   . HIATAL HERNIA   . HYPERLIPIDEMIA   . HYPERTENSION   . Insomnia   . OA (osteoarthritis)    severe, shoulders, hands - inflammatory, ?RA  . S/P CABG x 1   . S/P inguinal hernia repair   . TUBULOVILLOUS ADENOMA, 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   colo 04/2012 - no  polyps - no further screening planned (age >65)  . UNSPECIFIED ANEMIA     Past Surgical History:  Procedure Laterality Date  . ABDOMINAL HYSTERECTOMY  1981  . APPENDECTOMY    . CATARACT EXTRACTION, BILATERAL    . CORONARY ARTERY BYPASS GRAFT  2010  . HERNIA REPAIR     INGUINAL  . JOINT REPLACEMENT    . OVARIAN CYST REMOVAL    . REPLACEMENT TOTAL KNEE BILATERAL Bilateral    Rt-1974, Lt -1989  . REVERSE SHOULDER ARTHROPLASTY  09/08/2011   Procedure: REVERSE SHOULDER ARTHROPLASTY;  Surgeon: Nita Sells, MD;  Location: [facility];  Service: Orthopedics;  Laterality: Left;  left reverse total shoulder arthroplasty  . ROTATOR CUFF REPAIR     Left    Social History   Social History  . Marital status: Widowed    Spouse name: N/A  . Number of children: N/A  . Years of education: N/A   Social History Main Topics  . Smoking status: Former Smoker    Quit date: 04/26/1970  . Smokeless tobacco: Never Used  . Alcohol use 0.6 oz/week    1 Glasses of wine per week  . Drug use: No  . Sexual activity: Not on file   Other Topics Concern  . Not on file   Social History Narrative   Widows, lives alone. 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 Cancer Mother     Stomach  . Stomach cancer Mother 39  . Hypertension Mother   . Heart disease Father 26    MI  . Diabetes Father   . Heart attack Father   . Diabetes Daughter   . Hypertension Daughter   . Vascular Disease Sister   . Stroke Sister   . Dementia Sister   . Diabetes Sister   . Parkinsonism Sister   . Stroke Sister   . Heart disease Sister   . Heart attack Brother   . Anesthesia problems Neg Hx   . Colon cancer Neg Hx     Review of Systems     Objective:  There were no vitals filed for this visit. There were no vitals filed for this visit. There is no height or weight on file to calculate BMI.   Physical Exam    Constitutional: Appears well-developed  and well-nourished. No distress.  HENT:  Head: Normocephalic and atraumatic.  Neck: Neck supple. No tracheal deviation present. No thyromegaly present.  No cervical lymphadenopathy Cardiovascular: Normal rate, regular rhythm and normal heart sounds.   No murmur heard. No carotid bruit .  No edema Pulmonary/Chest: Effort normal and breath sounds normal. No respiratory distress. No has no wheezes. No rales.  Skin: Skin is warm  and dry. Not diaphoretic.  Psychiatric: Normal mood and affect. Behavior is normal.      Assessment & Plan:    See Problem List for Assessment and Plan of chronic medical problems.

## 2016-04-06 NOTE — Progress Notes (Deleted)
Subjective:   [name] is a 82 y.o. female who presents for Medicare Annual (Subsequent) preventive examination.  The Patient was informed that the wellness visit is to identify future health risk and educate and initiate measures that can reduce risk for increased disease through the lifespan.   Describes health as fair, good or great?   Review of Systems:  No ROS.  Medicare Wellness Visit.    Sleep patterns:   Home Safety/Smoke Alarms:   Living environment; residence and Firearm Safety:  Seat Belt Safety/Bike Helmet:   Counseling:   Eye Exam-  Dental-  Female:   Pap-04/20/2002       Mammo-04/02/2016,  Negative.       Dexa scan-01/21/2007, Osteopenia.         CCS-Colonoscopy 05/05/2012, diverticulosis. No recall (aged out).      Objective:     Vitals: There were no vitals taken for this visit.  There is no height or weight on file to calculate BMI.   Tobacco History  Smoking Status  . Former Smoker  . Quit date: 04/26/1970  Smokeless Tobacco  . Never Used     Counseling given: Not Answered   Past Medical History:  Diagnosis Date  . CAD, NATIVE VESSEL 06/09/2008  . DIABETES MELLITUS, TYPE II   . FIBROMYALGIA   . GERD   . Gout   . HIATAL HERNIA   . HYPERLIPIDEMIA   . HYPERTENSION   . Insomnia   . OA (osteoarthritis)    severe, shoulders, hands - inflammatory, ?RA  . S/P CABG x 1   . S/P inguinal hernia repair   . TUBULOVILLOUS ADENOMA, 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   colo 04/2012 - no polyps - no further screening planned (age >76)  . UNSPECIFIED ANEMIA    Past Surgical History:  Procedure Laterality Date  . ABDOMINAL HYSTERECTOMY  1981  . APPENDECTOMY    . CATARACT EXTRACTION, BILATERAL    . CORONARY ARTERY BYPASS GRAFT  2010  . HERNIA REPAIR     INGUINAL  . JOINT REPLACEMENT    . OVARIAN CYST REMOVAL    . REPLACEMENT TOTAL KNEE BILATERAL Bilateral    Rt-1974, Lt -1989  . REVERSE SHOULDER ARTHROPLASTY  09/08/2011   Procedure: REVERSE SHOULDER  ARTHROPLASTY;  Surgeon: Nita Sells, MD;  Location: [facility];  Service: Orthopedics;  Laterality: Left;  left reverse total shoulder arthroplasty  . ROTATOR CUFF REPAIR     Left   Family History  Problem Relation Age of Onset  . Cancer Mother     Stomach  . Stomach cancer Mother 4  . Hypertension Mother   . Heart disease Father 8    MI  . Diabetes Father   . Heart attack Father   . Diabetes Daughter   . Hypertension Daughter   . Vascular Disease Sister   . Stroke Sister   . Dementia Sister   . Diabetes Sister   . Parkinsonism Sister   . Stroke Sister   . Heart disease Sister   . Heart attack Brother   . Anesthesia problems Neg Hx   . Colon cancer Neg Hx    History  Sexual Activity  . Sexual activity: Not on file    Outpatient Encounter Prescriptions as of 04/07/2016  Medication Sig  . amLODipine (NORVASC) 5 MG tablet Take 1 tablet (5 mg total) by mouth 2 (two) times daily.  Marland Kitchen aspirin EC 81 MG tablet Take 81 mg by mouth daily.  Marland Kitchen atorvastatin (LIPITOR)  40 MG tablet TAKE 1 TABLET (40 MG TOTAL) BY MOUTH DAILY.  . carvedilol (COREG) 6.25 MG tablet Take 1 tablet (6.25 mg total) by mouth 2 (two) times daily with a meal.  . cetirizine (ZYRTEC) 10 MG tablet Take 1 tablet (10 mg total) by mouth daily.  Marland Kitchen docusate sodium (COLACE) 100 MG capsule Take 2 capsules (200 mg total) by mouth daily. (Patient taking differently: Take 100 mg by mouth 2 (two) times daily. )  . donepezil (ARICEPT) 10 MG tablet Take 1/2 tablet daily for 1 month, then increase to 1 tablet daily  . DULoxetine (CYMBALTA) 30 MG capsule TAKE 1 CAPSULE (30 MG TOTAL) BY MOUTH DAILY.  Marland Kitchen gabapentin (NEURONTIN) 100 MG capsule Take 1 capsule (100 mg total) by mouth 2 (two) times daily. (Patient taking differently: Take 100 mg by mouth daily. )  . gabapentin (NEURONTIN) 300 MG capsule Take 1 capsule (300 mg total) by mouth at bedtime. To take in addition to the daytime dosing (Patient taking differently: Take 600  mg by mouth at bedtime. To take in addition to the daytime dosing)  . HYDROcodone-acetaminophen (NORCO/VICODIN) 5-325 MG tablet Take 1-2 tablets by mouth every 4 (four) hours as needed.  . meloxicam (MOBIC) 7.5 MG tablet Take 1 tablet (7.5 mg total) by mouth daily.  Marland Kitchen omeprazole (PRILOSEC) 40 MG capsule Take 1 capsule (40 mg total) by mouth daily.  . ramipril (ALTACE) 10 MG capsule TAKE 1 CAPSULE (10 MG TOTAL) BY MOUTH DAILY.  Marland Kitchen TIZANIDINE HCL PO Take by mouth.   No facility-administered encounter medications on file as of 04/07/2016.     Activities of Daily Living No flowsheet data found.  Patient Care Team: Binnie Rail, MD as PCP - General (Internal Medicine) Tania Ade, MD as Consulting Physician (Orthopedic Surgery) Fay Records, MD as Consulting Physician (Cardiology) Unice Bailey, MD as Consulting Physician (Rheumatology) Ladene Artist, MD as Consulting Physician (Gastroenterology) Zonia Kief, MD (Rehabilitation) Rosetta Posner, MD (Vascular Surgery) [place], DPM (Podiatry)    Assessment:    Physical assessment deferred to PCP.  Exercise Activities and Dietary recommendations   Diet (meal preparation, eat out, water intake, caffeinated beverages, dairy products, fruits and vegetables):   Breakfast: Lunch:  Dinner:      Goals    None     Fall Risk Fall Risk  12/11/2015 04/02/2015 04/24/2014 12/22/2012  Falls in the past year? Yes Yes No No  Number falls in past yr: 1 1 - -  Injury with Fall? No No - -  Follow up - Falls prevention discussed - -   Depression Screen PHQ 2/9 Scores 04/02/2015 04/24/2014 12/22/2012  PHQ - 2 Score 1 0 0     Cognitive Function MMSE - Mini Mental State Exam 12/11/2015  Orientation to time 3  Orientation to Place 5  Registration 3  Attention/ Calculation 4  Recall 0  Language- name 2 objects 2  Language- repeat 1  Language- follow 3 step command 2  Language- read & follow direction 1  Write a sentence 1  Copy  design 0  Total score 22        Immunization History  Administered Date(s) Administered  . Influenza Split 02/19/2012  . Influenza Whole 01/17/2009, 01/07/2010  . Influenza, High Dose Seasonal PF 01/12/2014, 12/27/2014, 12/30/2015  . Influenza,inj,Quad PF,36+ Mos 12/22/2012  . Pneumococcal Conjugate-13 04/02/2015  . Pneumococcal Polysaccharide-23 12/22/2012  . Zoster 09/27/2012   Screening Tests Health Maintenance  Topic Date Due  .  TETANUS/TDAP  06/12/1949  . OPHTHALMOLOGY EXAM  11/19/2015  . HEMOGLOBIN A1C  03/28/2016  . FOOT EXAM  04/01/2016  . COLONOSCOPY  05/05/2016  . INFLUENZA VACCINE  Completed  . DEXA SCAN  Completed  . ZOSTAVAX  Completed  . PNA vac Low Risk Adult  Completed      Plan:     Continue to eat heart healthy diet (full of fruits, vegetables, whole grains, lean protein, water--limit salt, fat, and sugar intake) and increase physical activity as tolerated.  Continue doing brain stimulating activities (puzzles, reading, adult coloring books, staying active) to keep memory sharp.   During the course of the visit the patient was educated and counseled about the following appropriate screening and preventive services:   Vaccines to include Pneumoccal, Influenza, Hepatitis B, Td, Zostavax, HCV  Cardiovascular Disease  Colorectal cancer screening  Bone density screening  Diabetes screening  Glaucoma screening  Mammography/PAP  Nutrition counseling   Patient Instructions (the written plan) was given to the patient.   Gerilyn Nestle, RN  04/06/2016

## 2016-04-06 NOTE — Progress Notes (Deleted)
Pre visit review using our clinic review tool, if applicable. No additional management support is needed unless otherwise documented below in the visit note. 

## 2016-04-07 ENCOUNTER — Ambulatory Visit: Payer: Medicare Other

## 2016-04-07 ENCOUNTER — Other Ambulatory Visit: Payer: Self-pay | Admitting: Emergency Medicine

## 2016-04-07 MED ORDER — DULOXETINE HCL 30 MG PO CPEP: 30.0000 mg | ORAL_CAPSULE | Freq: Every day | ORAL | 1 refills | Status: DC

## 2016-04-23 DIAGNOSIS — M533 Sacrococcygeal disorders, not elsewhere classified: Secondary | ICD-10-CM | POA: Diagnosis not present

## 2016-04-27 ENCOUNTER — Other Ambulatory Visit: Payer: Self-pay | Admitting: Internal Medicine

## 2016-04-27 DIAGNOSIS — E785 Hyperlipidemia, unspecified: Secondary | ICD-10-CM

## 2016-04-27 DIAGNOSIS — I25810 Atherosclerosis of coronary artery bypass graft(s) without angina pectoris: Secondary | ICD-10-CM

## 2016-04-28 DIAGNOSIS — M25551 Pain in right hip: Secondary | ICD-10-CM | POA: Diagnosis not present

## 2016-04-28 DIAGNOSIS — R262 Difficulty in walking, not elsewhere classified: Secondary | ICD-10-CM | POA: Diagnosis not present

## 2016-04-30 DIAGNOSIS — M4696 Unspecified inflammatory spondylopathy, lumbar region: Secondary | ICD-10-CM | POA: Diagnosis not present

## 2016-04-30 DIAGNOSIS — Z79899 Other long term (current) drug therapy: Secondary | ICD-10-CM | POA: Diagnosis not present

## 2016-04-30 DIAGNOSIS — M5136 Other intervertebral disc degeneration, lumbar region: Secondary | ICD-10-CM | POA: Diagnosis not present

## 2016-04-30 DIAGNOSIS — Z79891 Long term (current) use of opiate analgesic: Secondary | ICD-10-CM | POA: Diagnosis not present

## 2016-04-30 DIAGNOSIS — M25551 Pain in right hip: Secondary | ICD-10-CM | POA: Diagnosis not present

## 2016-04-30 DIAGNOSIS — M47816 Spondylosis without myelopathy or radiculopathy, lumbar region: Secondary | ICD-10-CM | POA: Diagnosis not present

## 2016-04-30 DIAGNOSIS — R262 Difficulty in walking, not elsewhere classified: Secondary | ICD-10-CM | POA: Diagnosis not present

## 2016-04-30 DIAGNOSIS — G894 Chronic pain syndrome: Secondary | ICD-10-CM | POA: Diagnosis not present

## 2016-05-04 DIAGNOSIS — M25551 Pain in right hip: Secondary | ICD-10-CM | POA: Diagnosis not present

## 2016-05-04 DIAGNOSIS — R262 Difficulty in walking, not elsewhere classified: Secondary | ICD-10-CM | POA: Diagnosis not present

## 2016-05-05 DIAGNOSIS — M25551 Pain in right hip: Secondary | ICD-10-CM | POA: Diagnosis not present

## 2016-05-05 DIAGNOSIS — R262 Difficulty in walking, not elsewhere classified: Secondary | ICD-10-CM | POA: Diagnosis not present

## 2016-05-12 DIAGNOSIS — R262 Difficulty in walking, not elsewhere classified: Secondary | ICD-10-CM | POA: Diagnosis not present

## 2016-05-12 DIAGNOSIS — M25551 Pain in right hip: Secondary | ICD-10-CM | POA: Diagnosis not present

## 2016-05-14 DIAGNOSIS — M25551 Pain in right hip: Secondary | ICD-10-CM | POA: Diagnosis not present

## 2016-05-14 DIAGNOSIS — R262 Difficulty in walking, not elsewhere classified: Secondary | ICD-10-CM | POA: Diagnosis not present

## 2016-05-18 DIAGNOSIS — R262 Difficulty in walking, not elsewhere classified: Secondary | ICD-10-CM | POA: Diagnosis not present

## 2016-05-18 DIAGNOSIS — M25551 Pain in right hip: Secondary | ICD-10-CM | POA: Diagnosis not present

## 2016-05-28 ENCOUNTER — Encounter: Payer: Self-pay | Admitting: Internal Medicine

## 2016-05-28 ENCOUNTER — Other Ambulatory Visit (INDEPENDENT_AMBULATORY_CARE_PROVIDER_SITE_OTHER): Payer: Medicare Other

## 2016-05-28 ENCOUNTER — Ambulatory Visit (INDEPENDENT_AMBULATORY_CARE_PROVIDER_SITE_OTHER): Payer: Medicare Other | Admitting: Internal Medicine

## 2016-05-28 VITALS — BP 158/72 | HR 61 | Temp 97.8°F | Resp 16 | Ht 59.0 in | Wt 143.0 lb

## 2016-05-28 DIAGNOSIS — E1140 Type 2 diabetes mellitus with diabetic neuropathy, unspecified: Secondary | ICD-10-CM

## 2016-05-28 DIAGNOSIS — K219 Gastro-esophageal reflux disease without esophagitis: Secondary | ICD-10-CM | POA: Diagnosis not present

## 2016-05-28 DIAGNOSIS — M5137 Other intervertebral disc degeneration, lumbosacral region: Secondary | ICD-10-CM | POA: Diagnosis not present

## 2016-05-28 DIAGNOSIS — G8929 Other chronic pain: Secondary | ICD-10-CM | POA: Insufficient documentation

## 2016-05-28 DIAGNOSIS — F3289 Other specified depressive episodes: Secondary | ICD-10-CM | POA: Diagnosis not present

## 2016-05-28 DIAGNOSIS — M47817 Spondylosis without myelopathy or radiculopathy, lumbosacral region: Secondary | ICD-10-CM | POA: Diagnosis not present

## 2016-05-28 DIAGNOSIS — I10 Essential (primary) hypertension: Secondary | ICD-10-CM | POA: Diagnosis not present

## 2016-05-28 DIAGNOSIS — D509 Iron deficiency anemia, unspecified: Secondary | ICD-10-CM | POA: Diagnosis not present

## 2016-05-28 DIAGNOSIS — G894 Chronic pain syndrome: Secondary | ICD-10-CM | POA: Diagnosis not present

## 2016-05-28 DIAGNOSIS — M48061 Spinal stenosis, lumbar region without neurogenic claudication: Secondary | ICD-10-CM | POA: Diagnosis not present

## 2016-05-28 DIAGNOSIS — I25810 Atherosclerosis of coronary artery bypass graft(s) without angina pectoris: Secondary | ICD-10-CM | POA: Diagnosis not present

## 2016-05-28 DIAGNOSIS — R1011 Right upper quadrant pain: Secondary | ICD-10-CM

## 2016-05-28 DIAGNOSIS — M533 Sacrococcygeal disorders, not elsewhere classified: Secondary | ICD-10-CM | POA: Diagnosis not present

## 2016-05-28 LAB — COMPREHENSIVE METABOLIC PANEL
ALT: 25 U/L (ref 0–35)
AST: 23 U/L (ref 0–37)
Albumin: 3.8 g/dL (ref 3.5–5.2)
Alkaline Phosphatase: 55 U/L (ref 39–117)
BUN: 16 mg/dL (ref 6–23)
CO2: 33 mEq/L — ABNORMAL HIGH (ref 19–32)
Calcium: 9.1 mg/dL (ref 8.4–10.5)
Chloride: 105 mEq/L (ref 96–112)
Creatinine, Ser: 0.78 mg/dL (ref 0.40–1.20)
GFR: 90.5 mL/min (ref >60.00)
Glucose, Bld: 117 mg/dL — ABNORMAL HIGH (ref 70–99)
Potassium: 4.4 mEq/L (ref 3.5–5.1)
Sodium: 139 mEq/L (ref 135–145)
Total Bilirubin: 0.6 mg/dL (ref 0.2–1.2)
Total Protein: 6.5 g/dL (ref 6.0–8.3)

## 2016-05-28 LAB — LIPID PANEL
Cholesterol: 176 mg/dL (ref 0–200)
HDL: 92.6 mg/dL (ref >39.00)
LDL Cholesterol: 72 mg/dL (ref 0–99)
NonHDL: 83.71
Total CHOL/HDL Ratio: 2
Triglycerides: 61 mg/dL (ref 0.0–149.0)
VLDL: 12.2 mg/dL (ref 0.0–40.0)

## 2016-05-28 LAB — CBC WITH DIFFERENTIAL/PLATELET
Basophils Absolute: 0.1 10*3/uL (ref 0.0–0.1)
Basophils Relative: 1.1 % (ref 0.0–3.0)
Eosinophils Absolute: 0.1 10*3/uL (ref 0.0–0.7)
Eosinophils Relative: 1.2 % (ref 0.0–5.0)
HCT: 38.7 % (ref 36.0–46.0)
Hemoglobin: 12.9 g/dL (ref 12.0–15.0)
Lymphocytes Relative: 40.6 % (ref 12.0–46.0)
Lymphs Abs: 1.9 10*3/uL (ref 0.7–4.0)
MCHC: 33.3 g/dL (ref 30.0–36.0)
MCV: 95.3 fl (ref 78.0–100.0)
Monocytes Absolute: 0.4 10*3/uL (ref 0.1–1.0)
Monocytes Relative: 7.7 % (ref 3.0–12.0)
Neutro Abs: 2.3 10*3/uL (ref 1.4–7.7)
Neutrophils Relative %: 49.4 % (ref 43.0–77.0)
Platelets: 194 10*3/uL (ref 150.0–400.0)
RBC: 4.06 Mil/uL (ref 3.87–5.11)
RDW: 14.7 % (ref 11.5–15.5)
WBC: 4.7 10*3/uL (ref 4.0–10.5)

## 2016-05-28 LAB — HEMOGLOBIN A1C: Hgb A1c MFr Bld: 6.1 % (ref 4.6–6.5)

## 2016-05-28 MED ORDER — RAMIPRIL 10 MG PO CAPS: 10.0000 mg | ORAL_CAPSULE | Freq: Two times a day (BID) | ORAL | 3 refills | Status: DC

## 2016-05-28 NOTE — Assessment & Plan Note (Signed)
Controlled, stable Continue current dose of medication  

## 2016-05-28 NOTE — Assessment & Plan Note (Addendum)
Lab Results  Component Value Date   HGBA1C 6.4 09/27/2015   Diet controlled Check a1c today Eye exam up to date Discussed checking feet daily

## 2016-05-28 NOTE — Assessment & Plan Note (Signed)
Mild cbc 

## 2016-05-28 NOTE — Progress Notes (Signed)
Subjective:    Patient ID: [name], female    DOB: 07-20-1930, 83 y.o.   MRN: [id number]  HPI She is here for follow up.  Diabetes: She is controlling her sugars with diet. She is compliant with a diabetic diet. She is doing PT, but not exercising regularly.  She does not check her feet daily.  She is up-to-date with an ophthalmology examination.   Hypertension: She is taking her medication daily. She is compliant with a low sodium diet.  She denies chest pain, palpitations, edema, shortness of breath and regular headaches. She is doing PT , but not exercising regularly.      Hyperlipidemia: She is taking her medication daily. She is compliant with a low fat/cholesterol diet. She is doing PT, but not exercising regularly. She denies myalgias.   GERD:  She is taking her medication daily as prescribed.  She denies any GERD symptoms and feels her GERD is well controlled.   Depression: She is taking her medication daily as prescribed. She denies any side effects from the medication. She feels her depression is well controlled and she is happy with her current dose of medication.   Back pain:  She is following with pain management.  She is doing PT.  She is taking gabapentin. She has back pain and cramps in her legs.  She also has hip bursitis.   RUQ pain:  She may feel it every day or every other day.  It is not constant.  Bending over or bringing her knees up increases the pain.  Laying down or getting off of her feet helps and will take away the pain.  It feels like a spasm.     Medications and allergies reviewed with patient and updated if appropriate.  Patient Active Problem List   Diagnosis Date Noted  . Mild cognitive impairment 12/12/2015  . Right knee pain 06/14/2015  . Cough 05/02/2015  . Diabetic neuropathy ([place]) 03/21/2014  . Bunion 03/21/2014  . PVD (pulmonary valve disease) 01/23/2014  . Onychomycosis 12/20/2013  . PVD (peripheral vascular disease) ([place])  12/19/2013  . Gout 12/13/2013  . Depression   . Spinal stenosis of lumbar region 04/24/2013  . Arthritis of shoulder region, left 09/11/2011  . Type 2 diabetes mellitus ([place]) 01/17/2009  . Anemia 10/15/2008  . CAD, NATIVE VESSEL 06/09/2008  . Hyperlipidemia 06/15/2007  . Essential hypertension 06/15/2007  . GERD 06/15/2007  . Fibromyalgia syndrome 06/15/2007  . 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 DIVERTICULOSIS, 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    Current Outpatient Prescriptions on File Prior to Visit  Medication Sig Dispense Refill  . amLODipine (NORVASC) 5 MG tablet TAKE 1 TABLET (5 MG TOTAL) BY MOUTH 2 (TWO) TIMES DAILY. 180 tablet 2  . aspirin EC 81 MG tablet Take 81 mg by mouth daily.    Marland Kitchen atorvastatin (LIPITOR) 40 MG tablet TAKE 1 TABLET (40 MG TOTAL) BY MOUTH DAILY. 90 tablet 2  . carvedilol (COREG) 6.25 MG tablet Take 1 tablet (6.25 mg total) by mouth 2 (two) times daily with a meal. 180 tablet 3  . cetirizine (ZYRTEC) 10 MG tablet Take 1 tablet (10 mg total) by mouth daily. 30 tablet 11  . docusate sodium (COLACE) 100 MG capsule Take 2 capsules (200 mg total) by mouth daily. (Patient taking differently: Take 100 mg by mouth 2 (two) times daily. ) 60 capsule 5  . donepezil (ARICEPT) 10 MG tablet Take 1/2 tablet daily for 1 month, then increase to 1 tablet  daily 30 tablet 11  . DULoxetine (CYMBALTA) 30 MG capsule Take 1 capsule (30 mg total) by mouth daily. 90 capsule 1  . gabapentin (NEURONTIN) 100 MG capsule Take 1 capsule (100 mg total) by mouth 2 (two) times daily. (Patient taking differently: Take 100 mg by mouth daily. ) 180 capsule 1  . gabapentin (NEURONTIN) 300 MG capsule Take 1 capsule (300 mg total) by mouth at bedtime. To take in addition to the daytime dosing (Patient taking differently: Take 600 mg by mouth at bedtime. To take in addition to the daytime dosing) 180 capsule 1  . HYDROcodone-acetaminophen (NORCO/VICODIN) 5-325 MG tablet Take 1-2 tablets by mouth every 4 (four)  hours as needed. 60 tablet 0  . meloxicam (MOBIC) 7.5 MG tablet TAKE 1 TABLET (7.5 MG TOTAL) BY MOUTH DAILY. 90 tablet 1  . omeprazole (PRILOSEC) 40 MG capsule Take 1 capsule (40 mg total) by mouth daily. 90 capsule 3  . ramipril (ALTACE) 10 MG capsule TAKE 1 CAPSULE (10 MG TOTAL) BY MOUTH DAILY. 90 capsule 2  . TIZANIDINE HCL PO Take by mouth.     No current facility-administered medications on file prior to visit.     Past Medical History:  Diagnosis Date  . CAD, NATIVE VESSEL 06/09/2008  . DIABETES MELLITUS, TYPE II   . FIBROMYALGIA   . GERD   . Gout   . HIATAL HERNIA   . HYPERLIPIDEMIA   . HYPERTENSION   . Insomnia   . OA (osteoarthritis)    severe, shoulders, hands - inflammatory, ?RA  . S/P CABG x 1   . S/P inguinal hernia repair   . TUBULOVILLOUS ADENOMA, 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   colo 04/2012 - no polyps - no further screening planned (age >32)  . UNSPECIFIED ANEMIA     Past Surgical History:  Procedure Laterality Date  . ABDOMINAL HYSTERECTOMY  1981  . APPENDECTOMY    . CATARACT EXTRACTION, BILATERAL    . CORONARY ARTERY BYPASS GRAFT  2010  . HERNIA REPAIR     INGUINAL  . JOINT REPLACEMENT    . OVARIAN CYST REMOVAL    . REPLACEMENT TOTAL KNEE BILATERAL Bilateral    Rt-1974, Lt -1989  . REVERSE SHOULDER ARTHROPLASTY  09/08/2011   Procedure: REVERSE SHOULDER ARTHROPLASTY;  Surgeon: Nita Sells, MD;  Location: [facility];  Service: Orthopedics;  Laterality: Left;  left reverse total shoulder arthroplasty  . ROTATOR CUFF REPAIR     Left    Social History   Social History  . Marital status: Widowed    Spouse name: N/A  . Number of children: N/A  . Years of education: N/A   Social History Main Topics  . Smoking status: Former Smoker    Quit date: 04/26/1970  . Smokeless tobacco: Never Used  . Alcohol use 0.6 oz/week    1 Glasses of wine per week  . Drug use: No  . Sexual activity: Not Asked   Other Topics Concern  . None   Social History Narrative    Widows, lives alone. 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 Cancer Mother     Stomach  . Stomach cancer Mother 41  . Hypertension Mother   . Heart disease Father 5    MI  . Diabetes Father   . Heart attack Father   . Diabetes Daughter   . Hypertension Daughter   . Vascular Disease Sister   . Stroke  Sister   . Dementia Sister   . Diabetes Sister   . Parkinsonism Sister   . Stroke Sister   . Heart disease Sister   . Heart attack Brother   . Anesthesia problems Neg Hx   . Colon cancer Neg Hx     Review of Systems  Constitutional: Negative for fever.  Respiratory: Negative for cough, shortness of breath and wheezing.   Cardiovascular: Negative for chest pain, palpitations and leg swelling.  Gastrointestinal: Positive for abdominal pain. Negative for constipation, diarrhea and nausea.  Musculoskeletal: Positive for back pain.  Neurological: Negative for light-headedness and headaches.  Psychiatric/Behavioral: Positive for dysphoric mood (controlled, but sad at times). The patient is nervous/anxious (at times).        Objective:   Vitals:   05/28/16 0957  BP: (!) 158/72  Pulse: 61  Resp: 16  Temp: 97.8 F (36.6 C)   Filed Weights   05/28/16 0957  Weight: 143 lb (64.9 kg)   Body mass index is 28.88 kg/m.  Wt Readings from Last 3 Encounters:  05/28/16 143 lb (64.9 kg)  01/06/16 147 lb 12.8 oz (67 kg)  12/30/15 148 lb (67.1 kg)   BP Readings from Last 3 Encounters:  05/28/16 (!) 158/72  01/06/16 (!) 154/78  12/30/15 (!) 144/80      Physical Exam Constitutional: Appears well-developed and well-nourished. No distress.  HENT:  Head: Normocephalic and atraumatic.  Neck: Neck supple. No tracheal deviation present. No thyromegaly present.  No cervical lymphadenopathy Cardiovascular: Normal rate, regular rhythm and normal heart sounds.   1/6 Systolic murmur heard. No carotid bruit .  No  edema Pulmonary/Chest: Effort normal and breath sounds normal. No respiratory distress. No has no wheezes. No rales.  Abdomen: soft, nontender, nondistended Skin: Skin is warm and dry. Not diaphoretic.  Psychiatric: Normal mood and affect. Behavior is normal.        Assessment & Plan:   See Problem List for Assessment and Plan of chronic medical problems.  FU in 3 months

## 2016-05-28 NOTE — Assessment & Plan Note (Signed)
GERD controlled Continue daily medication  

## 2016-05-28 NOTE — Progress Notes (Signed)
Pre visit review using our clinic review tool, if applicable. No additional management support is needed unless otherwise documented below in the visit note. 

## 2016-05-28 NOTE — Assessment & Plan Note (Addendum)
Taking gabapentin Dr Andree Elk - giving injections Doing PT

## 2016-05-28 NOTE — Patient Instructions (Addendum)
  Test(s) ordered today. Your results will be released to Vayas (or called to you) after review, usually within 72hours after test completion. If any changes need to be made, you will be notified at that same time.   No immunizations administered today.   Medications reviewed and updated.  Changes include increasing the ramipril to 10 mg twice a day.  Continue all of your other medications.    Please followup in 3 months

## 2016-05-28 NOTE — Assessment & Plan Note (Signed)
Having right upper quadrant pain that is intermittent. She typically only experiences it when she bends over or brings her knees to her chest. Pain resolves with lying flat or stretching out Nontender on exam Likely musculoskeletal She will continue to monitor, but no further evaluation at this time

## 2016-05-28 NOTE — Assessment & Plan Note (Signed)
BP elevated Increase ramipril to 10 mg twice daily Continue amlodipine 5 mg BID  Continue coreg 6.25 mg BID  cmp

## 2016-06-01 DIAGNOSIS — M25551 Pain in right hip: Secondary | ICD-10-CM | POA: Diagnosis not present

## 2016-06-01 DIAGNOSIS — R262 Difficulty in walking, not elsewhere classified: Secondary | ICD-10-CM | POA: Diagnosis not present

## 2016-06-06 ENCOUNTER — Encounter: Payer: Self-pay | Admitting: Internal Medicine

## 2016-06-21 ENCOUNTER — Other Ambulatory Visit: Payer: Self-pay | Admitting: Family

## 2016-06-25 ENCOUNTER — Other Ambulatory Visit: Payer: Self-pay | Admitting: *Deleted

## 2016-06-25 MED ORDER — CARVEDILOL 6.25 MG PO TABS: 6.2500 mg | ORAL_TABLET | Freq: Two times a day (BID) | ORAL | 1 refills | Status: DC

## 2016-07-15 ENCOUNTER — Encounter: Payer: Self-pay | Admitting: Neurology

## 2016-07-15 ENCOUNTER — Ambulatory Visit (INDEPENDENT_AMBULATORY_CARE_PROVIDER_SITE_OTHER): Payer: Medicare Other | Admitting: Neurology

## 2016-07-15 DIAGNOSIS — I25810 Atherosclerosis of coronary artery bypass graft(s) without angina pectoris: Secondary | ICD-10-CM | POA: Diagnosis not present

## 2016-07-15 DIAGNOSIS — G3184 Mild cognitive impairment, so stated: Secondary | ICD-10-CM | POA: Diagnosis not present

## 2016-07-15 MED ORDER — DONEPEZIL HCL 10 MG PO TABS: ORAL_TABLET | ORAL | 3 refills | Status: DC
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
## 2016-07-15 NOTE — Patient Instructions (Signed)
1. Continue Aricept 10mg  daily 2. Control of blood pressure, cholesterol, diabetes, as well as physical exercise and brain stimulation exercises are important for brain health 3. Follow-up in 1 year, call for any changes

## 2016-07-16 DIAGNOSIS — M5441 Lumbago with sciatica, right side: Secondary | ICD-10-CM | POA: Diagnosis not present

## 2016-07-16 DIAGNOSIS — M7061 Trochanteric bursitis, right hip: Secondary | ICD-10-CM | POA: Diagnosis not present

## 2016-07-22 DIAGNOSIS — M25551 Pain in right hip: Secondary | ICD-10-CM | POA: Diagnosis not present

## 2016-07-22 DIAGNOSIS — R262 Difficulty in walking, not elsewhere classified: Secondary | ICD-10-CM | POA: Diagnosis not present

## 2016-07-23 DIAGNOSIS — M25551 Pain in right hip: Secondary | ICD-10-CM | POA: Diagnosis not present

## 2016-07-23 DIAGNOSIS — R262 Difficulty in walking, not elsewhere classified: Secondary | ICD-10-CM | POA: Diagnosis not present

## 2016-07-27 DIAGNOSIS — M25551 Pain in right hip: Secondary | ICD-10-CM | POA: Diagnosis not present

## 2016-07-27 DIAGNOSIS — R262 Difficulty in walking, not elsewhere classified: Secondary | ICD-10-CM | POA: Diagnosis not present

## 2016-07-30 DIAGNOSIS — R262 Difficulty in walking, not elsewhere classified: Secondary | ICD-10-CM | POA: Diagnosis not present

## 2016-07-30 DIAGNOSIS — M25551 Pain in right hip: Secondary | ICD-10-CM | POA: Diagnosis not present

## 2016-08-06 DIAGNOSIS — M5441 Lumbago with sciatica, right side: Secondary | ICD-10-CM | POA: Diagnosis not present

## 2016-08-06 DIAGNOSIS — M545 Low back pain: Secondary | ICD-10-CM | POA: Diagnosis not present

## 2016-08-10 DIAGNOSIS — R262 Difficulty in walking, not elsewhere classified: Secondary | ICD-10-CM | POA: Diagnosis not present

## 2016-08-10 DIAGNOSIS — M25551 Pain in right hip: Secondary | ICD-10-CM | POA: Diagnosis not present

## 2016-08-12 DIAGNOSIS — R262 Difficulty in walking, not elsewhere classified: Secondary | ICD-10-CM | POA: Diagnosis not present

## 2016-08-12 DIAGNOSIS — M25551 Pain in right hip: Secondary | ICD-10-CM | POA: Diagnosis not present

## 2016-08-16 ENCOUNTER — Other Ambulatory Visit: Payer: Self-pay | Admitting: Internal Medicine

## 2016-08-17 DIAGNOSIS — R262 Difficulty in walking, not elsewhere classified: Secondary | ICD-10-CM | POA: Diagnosis not present

## 2016-08-17 DIAGNOSIS — M25551 Pain in right hip: Secondary | ICD-10-CM | POA: Diagnosis not present

## 2016-08-17 DIAGNOSIS — M5416 Radiculopathy, lumbar region: Secondary | ICD-10-CM | POA: Diagnosis not present

## 2016-08-17 DIAGNOSIS — M545 Low back pain: Secondary | ICD-10-CM | POA: Diagnosis not present

## 2016-08-20 DIAGNOSIS — M545 Low back pain: Secondary | ICD-10-CM | POA: Diagnosis not present

## 2016-08-20 DIAGNOSIS — M5442 Lumbago with sciatica, left side: Secondary | ICD-10-CM | POA: Diagnosis not present

## 2016-08-20 DIAGNOSIS — M5441 Lumbago with sciatica, right side: Secondary | ICD-10-CM | POA: Diagnosis not present

## 2016-08-26 DIAGNOSIS — H02812 Retained foreign body in right lower eyelid: Secondary | ICD-10-CM | POA: Diagnosis not present

## 2016-09-03 ENCOUNTER — Telehealth: Payer: Self-pay | Admitting: Internal Medicine

## 2016-09-03 ENCOUNTER — Encounter: Payer: Medicare Other | Admitting: Internal Medicine

## 2016-09-03 DIAGNOSIS — M5442 Lumbago with sciatica, left side: Secondary | ICD-10-CM | POA: Diagnosis not present

## 2016-09-03 DIAGNOSIS — M5441 Lumbago with sciatica, right side: Secondary | ICD-10-CM | POA: Diagnosis not present

## 2016-09-03 DIAGNOSIS — M545 Low back pain: Secondary | ICD-10-CM | POA: Diagnosis not present

## 2016-09-03 NOTE — Progress Notes (Signed)
Subjective:    Patient ID: [name], female    DOB: [birth year], 83 y.o.   MRN: [id number]  HPI No show    Medications and allergies reviewed with patient and updated if appropriate.  Patient Active Problem List   Diagnosis Date Noted  . Chronic RUQ pain 05/28/2016  . Mild cognitive impairment 12/12/2015  . Right knee pain 06/14/2015  . Cough 05/02/2015  . Diabetic neuropathy ([place]) 03/21/2014  . Bunion 03/21/2014  . PVD (pulmonary valve disease) 01/23/2014  . Onychomycosis 12/20/2013  . PVD (peripheral vascular disease) ([place]) 12/19/2013  . Gout 12/13/2013  . Depression   . Spinal stenosis of lumbar region 04/24/2013  . Arthritis of shoulder region, left 09/11/2011  . Type 2 diabetes mellitus ([place]) 01/17/2009  . Anemia 10/15/2008  . CAD, NATIVE VESSEL 06/09/2008  . Hyperlipidemia 06/15/2007  . Essential hypertension 06/15/2007  . GERD 06/15/2007  . Fibromyalgia syndrome 06/15/2007  . 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 DIVERTICULOSIS, 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    Current Outpatient Prescriptions on File Prior to Visit  Medication Sig Dispense Refill  . amLODipine (NORVASC) 5 MG tablet TAKE 1 TABLET (5 MG TOTAL) BY MOUTH 2 (TWO) TIMES DAILY. 180 tablet 2  . aspirin EC 81 MG tablet Take 81 mg by mouth daily.    Marland Kitchen atorvastatin (LIPITOR) 40 MG tablet TAKE 1 TABLET (40 MG TOTAL) BY MOUTH DAILY. 90 tablet 2  . carvedilol (COREG) 6.25 MG tablet Take 1 tablet (6.25 mg total) by mouth 2 (two) times daily with a meal. 180 tablet 1  . cetirizine (ZYRTEC) 10 MG tablet TAKE 1 TABLET (10 MG TOTAL) BY MOUTH DAILY. 30 tablet 10  . docusate sodium (COLACE) 100 MG capsule Take 2 capsules (200 mg total) by mouth daily. (Patient taking differently: Take 100 mg by mouth 2 (two) times daily. ) 60 capsule 5  . donepezil (ARICEPT) 10 MG tablet Take 1 tablet daily 90 tablet 3  . DULoxetine (CYMBALTA) 30 MG capsule Take 1 capsule (30 mg total) by mouth daily. 90 capsule 1  . gabapentin  (NEURONTIN) 100 MG capsule Take 1 capsule (100 mg total) by mouth 2 (two) times daily. (Patient taking differently: Take 100 mg by mouth daily. ) 180 capsule 1  . gabapentin (NEURONTIN) 300 MG capsule TAKE 1 CAPSULE (300 MG TOTAL) BY MOUTH AT BEDTIME. TO TAKE IN ADDITION TO THE DAYTIME DOSING 90 capsule 1  . meloxicam (MOBIC) 7.5 MG tablet TAKE 1 TABLET (7.5 MG TOTAL) BY MOUTH DAILY. 90 tablet 1  . omeprazole (PRILOSEC) 40 MG capsule TAKE 1 CAPSULE (40 MG TOTAL) BY MOUTH DAILY ON AN EMPTY STOMACH 90 capsule 3  . ramipril (ALTACE) 10 MG capsule Take 1 capsule (10 mg total) by mouth 2 (two) times daily. 180 capsule 3   No current facility-administered medications on file prior to visit.     Past Medical History:  Diagnosis Date  . CAD, NATIVE VESSEL 06/09/2008  . DIABETES MELLITUS, TYPE II   . FIBROMYALGIA   . GERD   . Gout   . HIATAL HERNIA   . HYPERLIPIDEMIA   . HYPERTENSION   . Insomnia   . OA (osteoarthritis)    severe, shoulders, hands - inflammatory, ?RA  . S/P CABG x 1   . S/P inguinal hernia repair   . TUBULOVILLOUS ADENOMA, 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   colo 04/2012 - no polyps - no further screening planned (age >64)  . UNSPECIFIED ANEMIA     Past Surgical History:  Procedure Laterality Date  . ABDOMINAL HYSTERECTOMY  1981  . APPENDECTOMY    . CATARACT EXTRACTION, BILATERAL    . CORONARY ARTERY BYPASS GRAFT  2010  . HERNIA REPAIR     INGUINAL  . JOINT REPLACEMENT    . OVARIAN CYST REMOVAL    . REPLACEMENT TOTAL KNEE BILATERAL Bilateral    Rt-1974, Lt -1989  . REVERSE SHOULDER ARTHROPLASTY  09/08/2011   Procedure: REVERSE SHOULDER ARTHROPLASTY;  Surgeon: Nita Sells, MD;  Location: [facility];  Service: Orthopedics;  Laterality: Left;  left reverse total shoulder arthroplasty  . ROTATOR CUFF REPAIR     Left    Social History   Social History  . Marital status: Widowed    Spouse name: N/A  . Number of children: N/A  . Years of education: N/A   Social History Main  Topics  . Smoking status: Former Smoker    Quit date: 04/26/1970  . Smokeless tobacco: Never Used  . Alcohol use 0.6 oz/week    1 Glasses of wine per week  . Drug use: No  . Sexual activity: Not on file   Other Topics Concern  . Not on file   Social History Narrative   Widows, lives alone. 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 Cancer Mother        Stomach  . Stomach cancer Mother 18  . Hypertension Mother   . Heart disease Father 87       MI  . Diabetes Father   . Heart attack Father   . Diabetes Daughter   . Hypertension Daughter   . Vascular Disease Sister   . Stroke Sister   . Dementia Sister   . Diabetes Sister   . Parkinsonism Sister   . Stroke Sister   . Heart disease Sister   . Heart attack Brother   . Anesthesia problems Neg Hx   . Colon cancer Neg Hx     Review of Systems     Objective:  There were no vitals filed for this visit. There were no vitals filed for this visit. There is no height or weight on file to calculate BMI.  Wt Readings from Last 3 Encounters:  07/15/16 143 lb 6.4 oz (65 kg)  05/28/16 143 lb (64.9 kg)  01/06/16 147 lb 12.8 oz (67 kg)     Physical Exam          Assessment & Plan:   See Problem List for Assessment and Plan of chronic medical problems.   This encounter was created in error - please disregard.

## 2016-09-03 NOTE — Assessment & Plan Note (Signed)
Lipids well controlled Continue atorvastatin 40 mg daily

## 2016-09-03 NOTE — Telephone Encounter (Signed)
Pt no-showed for her appointment that was scheduled for 09/03/2016 at 10:45am for a 3 month follow up. How would you like to proceed?

## 2016-09-03 NOTE — Telephone Encounter (Signed)
Please call - she needs to reschedule.

## 2016-09-04 NOTE — Telephone Encounter (Signed)
Called pt to reschedule appointment. Phone number in chart did not work.

## 2016-09-08 DIAGNOSIS — G894 Chronic pain syndrome: Secondary | ICD-10-CM | POA: Diagnosis not present

## 2016-09-08 DIAGNOSIS — M47817 Spondylosis without myelopathy or radiculopathy, lumbosacral region: Secondary | ICD-10-CM | POA: Diagnosis not present

## 2016-09-08 DIAGNOSIS — M4316 Spondylolisthesis, lumbar region: Secondary | ICD-10-CM | POA: Diagnosis not present

## 2016-09-08 DIAGNOSIS — M4696 Unspecified inflammatory spondylopathy, lumbar region: Secondary | ICD-10-CM | POA: Diagnosis not present

## 2016-09-08 DIAGNOSIS — M5136 Other intervertebral disc degeneration, lumbar region: Secondary | ICD-10-CM | POA: Diagnosis not present

## 2016-09-08 DIAGNOSIS — Z79899 Other long term (current) drug therapy: Secondary | ICD-10-CM | POA: Diagnosis not present

## 2016-09-08 DIAGNOSIS — M5416 Radiculopathy, lumbar region: Secondary | ICD-10-CM | POA: Diagnosis not present

## 2016-09-08 DIAGNOSIS — M47816 Spondylosis without myelopathy or radiculopathy, lumbar region: Secondary | ICD-10-CM | POA: Diagnosis not present

## 2016-09-08 DIAGNOSIS — Z79891 Long term (current) use of opiate analgesic: Secondary | ICD-10-CM | POA: Diagnosis not present

## 2016-09-10 DIAGNOSIS — M5416 Radiculopathy, lumbar region: Secondary | ICD-10-CM | POA: Diagnosis not present

## 2016-09-10 DIAGNOSIS — M4316 Spondylolisthesis, lumbar region: Secondary | ICD-10-CM | POA: Diagnosis not present

## 2016-09-10 DIAGNOSIS — M47817 Spondylosis without myelopathy or radiculopathy, lumbosacral region: Secondary | ICD-10-CM | POA: Diagnosis not present

## 2016-10-01 DIAGNOSIS — M4316 Spondylolisthesis, lumbar region: Secondary | ICD-10-CM | POA: Diagnosis not present

## 2016-10-01 DIAGNOSIS — M7061 Trochanteric bursitis, right hip: Secondary | ICD-10-CM | POA: Diagnosis not present

## 2016-10-01 DIAGNOSIS — M47817 Spondylosis without myelopathy or radiculopathy, lumbosacral region: Secondary | ICD-10-CM | POA: Diagnosis not present

## 2016-10-01 DIAGNOSIS — M5416 Radiculopathy, lumbar region: Secondary | ICD-10-CM | POA: Diagnosis not present

## 2016-10-06 ENCOUNTER — Other Ambulatory Visit: Payer: Self-pay | Admitting: Internal Medicine

## 2016-10-06 DIAGNOSIS — Z1231 Encounter for screening mammogram for malignant neoplasm of breast: Secondary | ICD-10-CM

## 2016-10-13 ENCOUNTER — Ambulatory Visit (INDEPENDENT_AMBULATORY_CARE_PROVIDER_SITE_OTHER): Payer: Medicare Other | Admitting: Internal Medicine

## 2016-10-13 ENCOUNTER — Encounter: Payer: Self-pay | Admitting: Internal Medicine

## 2016-10-13 VITALS — BP 162/68 | HR 58 | Temp 97.9°F | Resp 16 | Wt 140.0 lb

## 2016-10-13 DIAGNOSIS — I10 Essential (primary) hypertension: Secondary | ICD-10-CM

## 2016-10-13 DIAGNOSIS — K219 Gastro-esophageal reflux disease without esophagitis: Secondary | ICD-10-CM | POA: Diagnosis not present

## 2016-10-13 DIAGNOSIS — I25810 Atherosclerosis of coronary artery bypass graft(s) without angina pectoris: Secondary | ICD-10-CM

## 2016-10-13 DIAGNOSIS — E7800 Pure hypercholesterolemia, unspecified: Secondary | ICD-10-CM

## 2016-10-13 DIAGNOSIS — E1140 Type 2 diabetes mellitus with diabetic neuropathy, unspecified: Secondary | ICD-10-CM | POA: Diagnosis not present

## 2016-10-13 MED ORDER — HYDROCHLOROTHIAZIDE 12.5 MG PO TABS: 12.5000 mg | ORAL_TABLET | Freq: Every day | ORAL | 3 refills | Status: DC

## 2016-10-13 NOTE — Patient Instructions (Addendum)
Your goal Blood pressure is < 140/90.   Medications reviewed and updated.  Changes include starting hydrochlorothiazide 12.5 mg in the morning.   Your prescription(s) have been submitted to your pharmacy. Please take as directed and contact our office if you believe you are having problem(s) with the medication(s).   Please followup in 2-3 weeks

## 2016-10-13 NOTE — Assessment & Plan Note (Signed)
Continue statin Increase exercise as tolerated

## 2016-10-13 NOTE — Assessment & Plan Note (Signed)
Diet controlled Last a1c was very good Will recheck a1c at next visit Increase exercise Continue diabetic diet

## 2016-10-13 NOTE — Progress Notes (Signed)
Subjective:    Patient ID: [name], female    DOB: 1931/03/14, 83 y.o.   MRN: [id number]  HPI The patient is here for follow up.  She is still dealing with bursitis in her right hip.  She has had injections by pain management, but it did not help much.    Hypertension: She is taking her medication daily. She is compliant with a low sodium diet.  She denies chest pain, palpitations, edema, shortness of breath and regular headaches. [place] is [place] [facility] just [place].  She does monitor her blood pressure at home - it was 163/74 two days ago.  She does not remember other readings.    GERD:  She is taking her medication daily as prescribed.  She denies any GERD symptoms and feels her GERD is well controlled.   Diabetes: She is taking her medication daily as prescribed. She is compliant with a diabetic diet. She is exercising. She is trying in increase her exercise - going to the [facility].   Hyperlipidemia: She is taking her medication daily. She is compliant with a low fat/cholesterol diet. She is exercising some. She denies myalgias.     Medications and allergies reviewed with patient and updated if appropriate.  Patient Active Problem List   Diagnosis Date Noted  . Chronic RUQ pain 05/28/2016  . Mild cognitive impairment 12/12/2015  . Right knee pain 06/14/2015  . Cough 05/02/2015  . Diabetic neuropathy ([place]) 03/21/2014  . Bunion 03/21/2014  . PVD (pulmonary valve disease) 01/23/2014  . Onychomycosis 12/20/2013  . PVD (peripheral vascular disease) ([place]) 12/19/2013  . Gout 12/13/2013  . Depression   . Spinal stenosis of lumbar region 04/24/2013  . Arthritis of shoulder region, left 09/11/2011  . Type 2 diabetes mellitus ([place]) 01/17/2009  . Anemia 10/15/2008  . CAD, NATIVE VESSEL 06/09/2008  . Hyperlipidemia 06/15/2007  . Essential hypertension 06/15/2007  . GERD 06/15/2007  . Fibromyalgia syndrome 06/15/2007  . TUBULOVILLOUS  ADENOMA, 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  . DIVERTICULOSIS, 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    Current Outpatient Prescriptions on File Prior to Visit  Medication Sig Dispense Refill  . amLODipine (NORVASC) 5 MG tablet TAKE 1 TABLET (5 MG TOTAL) BY MOUTH 2 (TWO) TIMES DAILY. 180 tablet 2  . aspirin EC 81 MG tablet Take 81 mg by mouth daily.    Marland Kitchen atorvastatin (LIPITOR) 40 MG tablet TAKE 1 TABLET (40 MG TOTAL) BY MOUTH DAILY. 90 tablet 2  . carvedilol (COREG) 6.25 MG tablet Take 1 tablet (6.25 mg total) by mouth 2 (two) times daily with a meal. 180 tablet 1  . cetirizine (ZYRTEC) 10 MG tablet TAKE 1 TABLET (10 MG TOTAL) BY MOUTH DAILY. 30 tablet 10  . docusate sodium (COLACE) 100 MG capsule Take 2 capsules (200 mg total) by mouth daily. (Patient taking differently: Take 100 mg by mouth 2 (two) times daily. ) 60 capsule 5  . donepezil (ARICEPT) 10 MG tablet Take 1 tablet daily 90 tablet 3  . DULoxetine (CYMBALTA) 30 MG capsule Take 1 capsule (30 mg total) by mouth daily. 90 capsule 1  . gabapentin (NEURONTIN) 300 MG capsule TAKE 1 CAPSULE (300 MG TOTAL) BY MOUTH AT BEDTIME. TO TAKE IN ADDITION TO THE DAYTIME DOSING 90 capsule 1  . meloxicam (MOBIC) 7.5 MG tablet TAKE 1 TABLET (7.5 MG TOTAL) BY MOUTH DAILY. 90 tablet 1  . omeprazole (PRILOSEC) 40 MG capsule TAKE 1 CAPSULE (40 MG TOTAL)  BY MOUTH DAILY ON AN EMPTY STOMACH 90 capsule 3  . ramipril (ALTACE) 10 MG capsule Take 1 capsule (10 mg total) by mouth 2 (two) times daily. 180 capsule 3  . gabapentin (NEURONTIN) 100 MG capsule Take 1 capsule (100 mg total) by mouth 2 (two) times daily. (Patient not taking: Reported on 10/13/2016) 180 capsule 1   No current facility-administered medications on file prior to visit.     Past Medical History:  Diagnosis Date  . CAD, NATIVE VESSEL 06/09/2008  . DIABETES MELLITUS, TYPE II   . FIBROMYALGIA   . GERD   . Gout   . HIATAL HERNIA   . HYPERLIPIDEMIA   . HYPERTENSION   . Insomnia   . OA (osteoarthritis)    severe, shoulders,  hands - inflammatory, ?RA  . S/P CABG x 1   . S/P inguinal hernia repair   . TUBULOVILLOUS ADENOMA, 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   colo 04/2012 - no polyps - no further screening planned (age >75)  . UNSPECIFIED ANEMIA     Past Surgical History:  Procedure Laterality Date  . ABDOMINAL HYSTERECTOMY  1981  . APPENDECTOMY    . CATARACT EXTRACTION, BILATERAL    . CORONARY ARTERY BYPASS GRAFT  2010  . HERNIA REPAIR     INGUINAL  . JOINT REPLACEMENT    . OVARIAN CYST REMOVAL    . REPLACEMENT TOTAL KNEE BILATERAL Bilateral    Rt-1974, Lt -1989  . REVERSE SHOULDER ARTHROPLASTY  09/08/2011   Procedure: REVERSE SHOULDER ARTHROPLASTY;  Surgeon: Nita Sells, MD;  Location: [facility];  Service: Orthopedics;  Laterality: Left;  left reverse total shoulder arthroplasty  . ROTATOR CUFF REPAIR     Left    Social History   Social History  . Marital status: Widowed    Spouse name: N/A  . Number of children: N/A  . Years of education: N/A   Social History Main Topics  . Smoking status: Former Smoker    Quit date: 04/26/1970  . Smokeless tobacco: Never Used  . Alcohol use 0.6 oz/week    1 Glasses of wine per week  . Drug use: No  . Sexual activity: Not on file   Other Topics Concern  . Not on file   Social History Narrative   Widows, lives alone. 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 Cancer Mother        Stomach  . Stomach cancer Mother 37  . Hypertension Mother   . Heart disease Father 61       MI  . Diabetes Father   . Heart attack Father   . Diabetes Daughter   . Hypertension Daughter   . Vascular Disease Sister   . Stroke Sister   . Dementia Sister   . Diabetes Sister   . Parkinsonism Sister   . Stroke Sister   . Heart disease Sister   . Heart attack Brother   . Anesthesia problems Neg Hx   . Colon cancer Neg Hx     Review of Systems  Constitutional: Negative for chills and fever.  Respiratory: Negative for  cough, shortness of breath and wheezing.   Cardiovascular: Negative for chest pain, palpitations and leg swelling.  Gastrointestinal: Negative for abdominal pain.       No gerd  Musculoskeletal: Positive for back pain.  Neurological: Negative for dizziness, light-headedness and headaches.       Objective:   Vitals:  10/13/16 1358  BP: (!) 162/68  Pulse: (!) 58  Resp: 16  Temp: 97.9 F (36.6 C)   Wt Readings from Last 3 Encounters:  10/13/16 140 lb (63.5 kg)  07/15/16 143 lb 6.4 oz (65 kg)  05/28/16 143 lb (64.9 kg)   Body mass index is 28.28 kg/m.   Physical Exam    Constitutional: Appears well-developed and well-nourished. No distress.  HENT:  Head: Normocephalic and atraumatic.  Neck: Neck supple. No tracheal deviation present. No thyromegaly present.  No cervical lymphadenopathy Cardiovascular: Normal rate, regular rhythm and normal heart sounds.   No murmur heard. No carotid bruit .  No edema Pulmonary/Chest: Effort normal and breath sounds normal. No respiratory distress. No has no wheezes. No rales.  Skin: Skin is warm and dry. Not diaphoretic.  Psychiatric: Normal mood and affect. Behavior is normal.      Assessment & Plan:    See Problem List for Assessment and Plan of chronic medical problems.

## 2016-10-13 NOTE — Assessment & Plan Note (Signed)
GERD controlled Continue daily medication  

## 2016-10-13 NOTE — Assessment & Plan Note (Addendum)
BP still elevated - monitor at home - bring in log Continue current medication Start hctz 12. 5 mg daily F/u in 2-3 weeks

## 2016-10-14 ENCOUNTER — Ambulatory Visit: Payer: Medicare Other | Admitting: Podiatry

## 2016-10-22 ENCOUNTER — Other Ambulatory Visit: Payer: Self-pay | Admitting: Internal Medicine

## 2016-10-22 ENCOUNTER — Ambulatory Visit (INDEPENDENT_AMBULATORY_CARE_PROVIDER_SITE_OTHER): Payer: Medicare Other | Admitting: Podiatry

## 2016-10-22 ENCOUNTER — Encounter: Payer: Self-pay | Admitting: Podiatry

## 2016-10-22 DIAGNOSIS — M79606 Pain in leg, unspecified: Secondary | ICD-10-CM | POA: Diagnosis not present

## 2016-10-22 DIAGNOSIS — I25810 Atherosclerosis of coronary artery bypass graft(s) without angina pectoris: Secondary | ICD-10-CM | POA: Diagnosis not present

## 2016-10-22 DIAGNOSIS — M659 Synovitis and tenosynovitis, unspecified: Secondary | ICD-10-CM | POA: Diagnosis not present

## 2016-10-22 NOTE — Patient Instructions (Signed)
Seen for pain on right lateral ankle. May be bursitis on right lateral ankle. Reviewed possible options. Compression stockinet dispensed for ankle support.  Return as needed.

## 2016-10-22 NOTE — Progress Notes (Signed)
Subjective: 83 year old female presents complaining of a bursitis on right ankle lateral aspect. She was using her own ankle support that was helping.  HPI: Having pain from spinal stenosis and pain and swelling in right knee.   Objective: Dermatologic: Normal findings. Vascular: All pedal pulses are palpable. Right DP is weaker than the left. Mild edema right lateral ankle with pain. Neuro: Normal response to Monofilament testing on both feet. Subjective numbness and tingling on left foot.  Orthopedic: Severe bunion bilateral. Contracted lesser digits bilateral.  Assessment: Hallux valgus with bilateral bunion deformity. Severe pronation STJ bilateral. Tenosynovitis right lateral ankle.   Plan: Reviewed findings and available options. Continue with ankle brace. Compression stockinet dispensed with instruction.

## 2016-10-23 NOTE — Telephone Encounter (Signed)
Routing to dr burns, please advise, thanks 

## 2016-10-23 NOTE — Telephone Encounter (Signed)
Ok to fill.  rx sent

## 2016-10-29 NOTE — Progress Notes (Signed)
Subjective:    Patient ID: [name], female    DOB: 1931-02-22, 83 y.o.   MRN: [id number]  HPI The patient is here for follow up.  Diabetes: She is controlling her sugars with diet. She is compliant with a diabetic diet. She is not exercising regularly.  She checks her feet daily and denies foot lesions.   Hypertension: She was started on hctz 3 weeks ago.  She is taking her medication daily. She is compliant with a low sodium diet.  She denies chest pain, palpitations, edema, shortness of breath and regular headaches. She is exercising regularly.  She does monitor her blood pressure at home - 156/74, 131/66, 120/54, 132/66, 116/50, 120/54, 122/67, 117/67.    B/l buttock pain:  She continues to have pain in her b/l buttock regions.  She has been diagnosed with lumbar radiculopathy and bursitis.  She has received injections and has done dry needling.  She is frustrated by the pain.  Ice helps.    Medications and allergies reviewed with patient and updated if appropriate.  Patient Active Problem List   Diagnosis Date Noted  . Buttock pain 10/30/2016  . Chronic RUQ pain 05/28/2016  . Mild cognitive impairment 12/12/2015  . Right knee pain 06/14/2015  . Diabetic neuropathy ([place]) 03/21/2014  . Bunion 03/21/2014  . PVD (pulmonary valve disease) 01/23/2014  . Onychomycosis 12/20/2013  . PVD (peripheral vascular disease) ([place]) 12/19/2013  . Gout 12/13/2013  . Depression   . Spinal stenosis of lumbar region 04/24/2013  . Arthritis of shoulder region, left 09/11/2011  . Type 2 diabetes mellitus ([place]) 01/17/2009  . Anemia 10/15/2008  . CAD, NATIVE VESSEL 06/09/2008  . Hyperlipidemia 06/15/2007  . Essential hypertension 06/15/2007  . GERD 06/15/2007  . Fibromyalgia syndrome 06/15/2007  . 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 DIVERTICULOSIS, 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    Current Outpatient Prescriptions on File Prior to Visit  Medication Sig Dispense Refill  . amLODipine  (NORVASC) 5 MG tablet TAKE 1 TABLET (5 MG TOTAL) BY MOUTH 2 (TWO) TIMES DAILY. 180 tablet 2  . aspirin EC 81 MG tablet Take 81 mg by mouth daily.    Marland Kitchen atorvastatin (LIPITOR) 40 MG tablet TAKE 1 TABLET (40 MG TOTAL) BY MOUTH DAILY. 90 tablet 2  . carvedilol (COREG) 6.25 MG tablet Take 1 tablet (6.25 mg total) by mouth 2 (two) times daily with a meal. 180 tablet 1  . cetirizine (ZYRTEC) 10 MG tablet TAKE 1 TABLET (10 MG TOTAL) BY MOUTH DAILY. 30 tablet 10  . docusate sodium (COLACE) 100 MG capsule Take 2 capsules (200 mg total) by mouth daily. (Patient taking differently: Take 100 mg by mouth 2 (two) times daily. ) 60 capsule 5  . donepezil (ARICEPT) 10 MG tablet Take 1 tablet daily 90 tablet 3  . DULoxetine (CYMBALTA) 30 MG capsule Take 1 capsule (30 mg total) by mouth daily. 90 capsule 1  . gabapentin (NEURONTIN) 300 MG capsule TAKE 1 CAPSULE (300 MG TOTAL) BY MOUTH AT BEDTIME. TO TAKE IN ADDITION TO THE DAYTIME DOSING 90 capsule 1  . hydrochlorothiazide (HYDRODIURIL) 12.5 MG tablet Take 1 tablet (12.5 mg total) by mouth daily. 90 tablet 3  . meloxicam (MOBIC) 7.5 MG tablet TAKE ONE TABLET BY MOUTH DAILY WITH FOOD 90 tablet 0  . omeprazole (PRILOSEC) 40 MG capsule TAKE 1 CAPSULE (40 MG TOTAL) BY MOUTH DAILY ON AN EMPTY STOMACH 90 capsule 3  . ramipril (ALTACE) 10 MG capsule Take 1 capsule (10 mg  total) by mouth 2 (two) times daily. 180 capsule 3   No current facility-administered medications on file prior to visit.     Past Medical History:  Diagnosis Date  . CAD, NATIVE VESSEL 06/09/2008  . DIABETES MELLITUS, TYPE II   . FIBROMYALGIA   . GERD   . Gout   . HIATAL HERNIA   . HYPERLIPIDEMIA   . HYPERTENSION   . Insomnia   . OA (osteoarthritis)    severe, shoulders, hands - inflammatory, ?RA  . S/P CABG x 1   . S/P inguinal hernia repair   . TUBULOVILLOUS ADENOMA, 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   colo 04/2012 - no polyps - no further screening planned (age >34)  . UNSPECIFIED ANEMIA     Past  Surgical History:  Procedure Laterality Date  . ABDOMINAL HYSTERECTOMY  1981  . APPENDECTOMY    . CATARACT EXTRACTION, BILATERAL    . CORONARY ARTERY BYPASS GRAFT  2010  . HERNIA REPAIR     INGUINAL  . JOINT REPLACEMENT    . OVARIAN CYST REMOVAL    . REPLACEMENT TOTAL KNEE BILATERAL Bilateral    Rt-1974, Lt -1989  . REVERSE SHOULDER ARTHROPLASTY  09/08/2011   Procedure: REVERSE SHOULDER ARTHROPLASTY;  Surgeon: Nita Sells, MD;  Location: [facility];  Service: Orthopedics;  Laterality: Left;  left reverse total shoulder arthroplasty  . ROTATOR CUFF REPAIR     Left    Social History   Social History  . Marital status: Widowed    Spouse name: N/A  . Number of children: N/A  . Years of education: N/A   Social History Main Topics  . Smoking status: Former Smoker    Quit date: 04/26/1970  . Smokeless tobacco: Never Used  . Alcohol use 0.6 oz/week    1 Glasses of wine per week  . Drug use: No  . Sexual activity: Not Asked   Other Topics Concern  . 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 Cancer Mother        Stomach  . Stomach cancer Mother 30  . Hypertension Mother   . Heart disease Father 24       MI  . Diabetes Father   . Heart attack Father   . Diabetes Daughter   . Hypertension Daughter   . Vascular Disease Sister   . Stroke Sister   . Dementia Sister   . Diabetes Sister   . Parkinsonism Sister   . Stroke Sister   . Heart disease Sister   . Heart attack Brother   . Anesthesia problems Neg Hx   . Colon cancer Neg Hx     Review of Systems  Constitutional: Negative for chills and fever.  HENT: Positive for rhinorrhea.   Respiratory: Positive for cough (only in morning). Negative for shortness of breath and wheezing.   Cardiovascular: Negative for chest pain, palpitations and leg swelling.  Musculoskeletal: Positive for myalgias.    Neurological: Negative for light-headedness and headaches.       Objective:   Vitals:   10/30/16 1535  BP: (!) 162/94  Pulse: 67  Resp: 16  Temp: 98.8 F (37.1 C)   Wt Readings from Last 3 Encounters:  10/30/16 137 lb (62.1 kg)  10/13/16 140 lb (63.5 kg)  07/15/16 143 lb 6.4 oz (65 kg)   Body mass index is 27.67 kg/m.  Physical Exam    Constitutional: Appears well-developed and well-nourished. No distress.  HENT:  Head: Normocephalic and atraumatic.  Neck: Neck supple. No tracheal deviation present. No thyromegaly present.  No cervical lymphadenopathy Cardiovascular: Normal rate, regular rhythm and normal heart sounds.   No murmur heard. No carotid bruit .  No edema Pulmonary/Chest: Effort normal and breath sounds normal. No respiratory distress. No has no wheezes. No rales.  Msk: pain with palpation in b/l buttock and SI joints Skin: Skin is warm and dry. Not diaphoretic.  Psychiatric: Normal mood and affect. Behavior is normal.      Assessment & Plan:    See Problem List for Assessment and Plan of chronic medical problems.

## 2016-10-30 ENCOUNTER — Encounter: Payer: Self-pay | Admitting: Internal Medicine

## 2016-10-30 ENCOUNTER — Ambulatory Visit (INDEPENDENT_AMBULATORY_CARE_PROVIDER_SITE_OTHER): Payer: Medicare Other | Admitting: Internal Medicine

## 2016-10-30 ENCOUNTER — Telehealth: Payer: Self-pay | Admitting: *Deleted

## 2016-10-30 VITALS — BP 162/94 | HR 67 | Temp 98.8°F | Resp 16 | Wt 137.0 lb

## 2016-10-30 DIAGNOSIS — E1140 Type 2 diabetes mellitus with diabetic neuropathy, unspecified: Secondary | ICD-10-CM | POA: Diagnosis not present

## 2016-10-30 DIAGNOSIS — M7918 Myalgia, other site: Secondary | ICD-10-CM | POA: Insufficient documentation

## 2016-10-30 DIAGNOSIS — M791 Myalgia: Secondary | ICD-10-CM | POA: Diagnosis not present

## 2016-10-30 DIAGNOSIS — I10 Essential (primary) hypertension: Secondary | ICD-10-CM

## 2016-10-30 DIAGNOSIS — I25810 Atherosclerosis of coronary artery bypass graft(s) without angina pectoris: Secondary | ICD-10-CM

## 2016-10-30 NOTE — Assessment & Plan Note (Addendum)
Elevated here today, but better at home - well controlled at home Will continue current medications at current doses cmp

## 2016-10-30 NOTE — Telephone Encounter (Signed)
Patient came in office and stated that she had an appt with Dr. Quay Burow on 09/03/16. Patients states that it was storming that day and she was not coming to her appt let alone call to cancel it . She states that she is not going to pay the no show fee for that visit. She states you can call her if you have any questions. Her number is [phone]. Thank you

## 2016-10-30 NOTE — Assessment & Plan Note (Signed)
Diet controlled - compliant with diabetic diet Not exercising - but will go back to the pool next week if pain is better Check a1c

## 2016-10-30 NOTE — Assessment & Plan Note (Signed)
Has seen ortho - bursitis Has had injections, uses ice and has tried dry needling Not sure if anything else can be done Will refer to dr Tamala Julian for further evaluation

## 2016-10-30 NOTE — Patient Instructions (Addendum)
  Test(s) ordered today. Your results will be released to [facility] (or called to you) after review, usually within 72hours after test completion. If any changes need to be made, you will be notified at that same time.   Medications reviewed and updated.  No changes recommended at this time.    A referral was ordered for sports medicine  Please followup in 4 months

## 2016-11-12 DIAGNOSIS — M4316 Spondylolisthesis, lumbar region: Secondary | ICD-10-CM | POA: Diagnosis not present

## 2016-11-12 DIAGNOSIS — M47817 Spondylosis without myelopathy or radiculopathy, lumbosacral region: Secondary | ICD-10-CM | POA: Diagnosis not present

## 2016-11-12 DIAGNOSIS — M7061 Trochanteric bursitis, right hip: Secondary | ICD-10-CM | POA: Diagnosis not present

## 2016-11-18 ENCOUNTER — Ambulatory Visit: Payer: Medicare Other | Admitting: Family Medicine

## 2017-01-06 ENCOUNTER — Other Ambulatory Visit: Payer: Self-pay | Admitting: Internal Medicine

## 2017-01-06 DIAGNOSIS — E785 Hyperlipidemia, unspecified: Secondary | ICD-10-CM

## 2017-01-06 DIAGNOSIS — I25810 Atherosclerosis of coronary artery bypass graft(s) without angina pectoris: Secondary | ICD-10-CM

## 2017-01-10 ENCOUNTER — Other Ambulatory Visit: Payer: Self-pay | Admitting: Internal Medicine

## 2017-01-10 DIAGNOSIS — E785 Hyperlipidemia, unspecified: Secondary | ICD-10-CM

## 2017-01-10 DIAGNOSIS — I25810 Atherosclerosis of coronary artery bypass graft(s) without angina pectoris: Secondary | ICD-10-CM

## 2017-01-20 DIAGNOSIS — E118 Type 2 diabetes mellitus with unspecified complications: Secondary | ICD-10-CM | POA: Diagnosis not present

## 2017-01-23 ENCOUNTER — Other Ambulatory Visit: Payer: Self-pay | Admitting: Internal Medicine

## 2017-02-03 DIAGNOSIS — M4316 Spondylolisthesis, lumbar region: Secondary | ICD-10-CM | POA: Diagnosis not present

## 2017-02-03 DIAGNOSIS — M47817 Spondylosis without myelopathy or radiculopathy, lumbosacral region: Secondary | ICD-10-CM | POA: Diagnosis not present

## 2017-02-03 DIAGNOSIS — M7061 Trochanteric bursitis, right hip: Secondary | ICD-10-CM | POA: Diagnosis not present

## 2017-02-03 DIAGNOSIS — M7062 Trochanteric bursitis, left hip: Secondary | ICD-10-CM | POA: Diagnosis not present

## 2017-02-05 NOTE — Progress Notes (Addendum)
Cardiology Office Note    Date:  02/08/2017   ID:  [name], DOB [birth year], MRN [id number]  PCP:  Binnie Rail, MD  Cardiologist:  Dr. Harrington Challenger  Chief Complaint: yearly follow up for CAD  History of Present Illness:   [name] is a 83 y.o. female with hx of CAD s/p CABG in 2010, HTN, HLD and fibromyalgia presents for follow  Up.   No ischemic testing since her CABG. She was doing well on cardiac stand point when last seen by Dr. Harrington Challenger 12/2015.   Here today for follow up. She came late as went to another MD's office.  Recent decline in memory. She has a chronic back, knee and hip pain. Positive sciatica pain bilaterally. He initially declined chest pain however later admitted to having chest with back pain. Unsure which medications she is taking. No regular blood pressure checkup.   Past Medical History:  Diagnosis Date  . CAD, NATIVE VESSEL 06/09/2008  . DIABETES MELLITUS, TYPE II   . FIBROMYALGIA   . GERD   . Gout   . HIATAL HERNIA   . HYPERLIPIDEMIA   . HYPERTENSION   . Insomnia   . OA (osteoarthritis)    severe, shoulders, hands - inflammatory, ?RA  . S/P CABG x 1   . S/P inguinal hernia repair   . TUBULOVILLOUS ADENOMA, 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   colo 04/2012 - no polyps - no further screening planned (age >18)  . UNSPECIFIED ANEMIA     Past Surgical History:  Procedure Laterality Date  . ABDOMINAL HYSTERECTOMY  1981  . APPENDECTOMY    . CATARACT EXTRACTION, BILATERAL    . CORONARY ARTERY BYPASS GRAFT  2010  . HERNIA REPAIR     INGUINAL  . JOINT REPLACEMENT    . OVARIAN CYST REMOVAL    . REPLACEMENT TOTAL KNEE BILATERAL Bilateral    Rt-1974, Lt -1989  . REVERSE SHOULDER ARTHROPLASTY  09/08/2011   Procedure: REVERSE SHOULDER ARTHROPLASTY;  Surgeon: Nita Sells, MD;  Location: [facility];  Service: Orthopedics;  Laterality: Left;  left reverse total shoulder arthroplasty  . ROTATOR CUFF REPAIR     Left    Current Medications: Prior to Admission  medications   Medication Sig Start Date End Date Taking? Authorizing Provider  amLODipine (NORVASC) 5 MG tablet TAKE 1 TABLET (5 MG TOTAL) BY MOUTH 2 (TWO) TIMES DAILY. 01/06/17   Fay Records, MD  aspirin EC 81 MG tablet Take 81 mg by mouth daily.    [provider]  atorvastatin (LIPITOR) 40 MG tablet TAKE 1 TABLET (40 MG TOTAL) BY MOUTH DAILY. 01/06/17   Fay Records, MD  carvedilol (COREG) 6.25 MG tablet TAKE ONE TABLET BY MOUTH TWICE A DAY WITH A MEAL 01/25/17   Burns, Claudina Lick, MD  cetirizine (ZYRTEC) 10 MG tablet TAKE 1 TABLET (10 MG TOTAL) BY MOUTH DAILY. 06/22/16   Binnie Rail, MD  docusate sodium (COLACE) 100 MG capsule Take 2 capsules (200 mg total) by mouth daily. Patient taking differently: Take 100 mg by mouth 2 (two) times daily.  06/21/15   Binnie Rail, MD  donepezil (ARICEPT) 10 MG tablet Take 1 tablet daily 07/15/16   Cameron Sprang, MD  DULoxetine (CYMBALTA) 30 MG capsule Take 1 capsule (30 mg total) by mouth daily. 04/07/16   Binnie Rail, MD  DULoxetine (CYMBALTA) 30 MG capsule TAKE ONE CAPSULE BY MOUTH DAILY 01/06/17   Binnie Rail, MD  gabapentin (NEURONTIN) 300 MG capsule TAKE 1 CAPSULE (300 MG TOTAL) BY MOUTH AT BEDTIME. TO TAKE IN ADDITION TO THE DAYTIME DOSING 08/17/16   Biagio Borg, MD  hydrochlorothiazide (HYDRODIURIL) 12.5 MG tablet Take 1 tablet (12.5 mg total) by mouth daily. 10/13/16   Binnie Rail, MD  meloxicam (MOBIC) 7.5 MG tablet TAKE ONE TABLET BY MOUTH DAILY WITH FOOD 01/25/17   Binnie Rail, MD  omeprazole (PRILOSEC) 40 MG capsule TAKE 1 CAPSULE (40 MG TOTAL) BY MOUTH DAILY ON AN EMPTY STOMACH 08/17/16   Binnie Rail, MD  ramipril (ALTACE) 10 MG capsule Take 1 capsule (10 mg total) by mouth 2 (two) times daily. 05/28/16   Binnie Rail, MD  ramipril (ALTACE) 10 MG capsule TAKE ONE CAPSULE BY MOUTH DAILY 01/25/17   Binnie Rail, MD    Allergies:   Patient has no known allergies.   Social History   Social History  . Marital status:  Widowed    Spouse name: N/A  . Number of children: N/A  . Years of education: N/A   Social History Main Topics  . Smoking status: Former Smoker    Quit date: 04/26/1970  . Smokeless tobacco: Never Used  . Alcohol use 0.6 oz/week    1 Glasses of wine per week  . Drug use: No  . Sexual activity: Not Asked   Other Topics Concern  . None   Social History Narrative   Widows, lives alone. 2 sons. 2 daughters. Retired Ambulance person     Family History:  The patient's family history includes Cancer in her mother; Dementia in her sister; Diabetes in her daughter, father, and sister; Heart attack in her brother and father; Heart disease in her sister; Heart disease (age of onset: 52) in her father; Hypertension in her daughter and mother; Parkinsonism in her sister; Stomach cancer (age of onset: 49) in her mother; Stroke in her sister and sister; Vascular Disease in her sister.   ROS:   Please see the history of present illness.    ROS All other systems reviewed and are negative.   PHYSICAL EXAM:   VS:  BP (!) 178/78   Pulse 64   Resp 16   Ht 4\' 11"  (1.499 m)   Wt 133 lb (60.3 kg)   SpO2 98%   BMI 26.86 kg/m    GEN: Well nourished, well developed, in no acute distress  HEENT: normal  Neck: no JVD, carotid bruits, or masses Cardiac: RRR; no murmurs, rubs, or gallops,no edema  Respiratory:  clear to auscultation bilaterally, normal work of breathing GI: soft, nontender, nondistended, + BS MS: no deformity or atrophy  Skin: warm and dry, no rash Neuro:  Alert and Oriented x 3, Strength and sensation are intact Psych: euthymic mood, full affect  Wt Readings from Last 3 Encounters:  02/08/17 133 lb (60.3 kg)  10/30/16 137 lb (62.1 kg)  10/13/16 140 lb (63.5 kg)      Studies/Labs Reviewed:   EKG:  EKG is ordered today.  The ekg ordered today demonstrates Sinus rhythm at rate of 56 bpm and T-wave inversion anteriorly (new).  Recent Labs: 05/28/2016: ALT 25; BUN 16; Creatinine,  Ser 0.78; Hemoglobin 12.9; Platelets 194.0; Potassium 4.4; Sodium 139   Lipid Panel    Component Value Date/Time   CHOL 176 05/28/2016 1052   TRIG 61.0 05/28/2016 1052   HDL 92.60 05/28/2016 1052   CHOLHDL 2 05/28/2016 1052   VLDL 12.2 05/28/2016 1052  LDLCALC 72 05/28/2016 1052   LDLDIRECT 115.3 09/30/2009 1059    Additional studies/ records that were reviewed today include:   As above    ASSESSMENT & PLAN:    1. CAD s/p CABG - Unable to provide detailed history due to decline in memory. Given new anterior T-wave inversion will do Lexiscan myoview. Continue ASA, statin, BB.   2. HTN - Elevated. Advised to keep log. She will bring all medications and blood pressure reading for review with hypertension clinic.  3. HLD - 05/28/2016: Cholesterol 176; HDL 92.60; LDL Cholesterol 72; Triglycerides 61.0; VLDL 12.2  - Continue statin  4. Arthritis/sciatic pain - Per PCP.  Medication Adjustments/Labs and Tests Ordered: Current medicines are reviewed at length with the patient today.  Concerns regarding medicines are outlined above.  Medication changes, Labs and Tests ordered today are listed in the Patient Instructions below. Patient Instructions  Medication Instructions:  Your physician recommends that you continue on your current medications as directed. Please refer to the Current Medication list given to you today.  Labwork: None   Testing/Procedures: Your physician has requested that you have a lexiscan myoview. For further information please visit [email or web address]. Please follow instruction sheet, as given.  Follow-Up: Your physician recommends that you schedule a follow-up appointment in: 3 months with Dr Harrington Challenger  Your physician recommends that you schedule a follow-up appointment in: WITH PHARMACIST ON SAME DAY AS LEXISCAN-TO DISCUSS CURRENT MEDICATION   Any Other Special Instructions Will Be Listed Below (If Applicable).  PLEASE BRING ALL OF YOUR MEDICATION WITH  YOU TO YOUR NEXT VISIT   If you need a refill on your cardiac medications before your next appointment, please call your pharmacy.     Jarrett Soho, [place]  02/08/2017 3:18 PM    [facility] [address], [place], [place]  [address] Phone: [phone]; Fax: [phone]

## 2017-02-08 ENCOUNTER — Encounter: Payer: Self-pay | Admitting: Physician Assistant

## 2017-02-08 ENCOUNTER — Ambulatory Visit (INDEPENDENT_AMBULATORY_CARE_PROVIDER_SITE_OTHER): Payer: Medicare Other | Admitting: Physician Assistant

## 2017-02-08 VITALS — BP 178/78 | HR 64 | Resp 16 | Ht 59.0 in | Wt 133.0 lb

## 2017-02-08 DIAGNOSIS — R9431 Abnormal electrocardiogram [ECG] [EKG]: Secondary | ICD-10-CM | POA: Diagnosis not present

## 2017-02-08 DIAGNOSIS — Z23 Encounter for immunization: Secondary | ICD-10-CM

## 2017-02-08 DIAGNOSIS — I25700 Atherosclerosis of coronary artery bypass graft(s), unspecified, with unstable angina pectoris: Secondary | ICD-10-CM | POA: Diagnosis not present

## 2017-02-08 DIAGNOSIS — I10 Essential (primary) hypertension: Secondary | ICD-10-CM

## 2017-02-08 DIAGNOSIS — E782 Mixed hyperlipidemia: Secondary | ICD-10-CM | POA: Diagnosis not present

## 2017-02-08 DIAGNOSIS — I25810 Atherosclerosis of coronary artery bypass graft(s) without angina pectoris: Secondary | ICD-10-CM

## 2017-02-08 DIAGNOSIS — I200 Unstable angina: Secondary | ICD-10-CM

## 2017-02-08 NOTE — Patient Instructions (Signed)
Medication Instructions:  Your physician recommends that you continue on your current medications as directed. Please refer to the Current Medication list given to you today.  Labwork: None   Testing/Procedures: Your physician has requested that you have a lexiscan myoview. For further information please visit [email or web address]. Please follow instruction sheet, as given.  Follow-Up: Your physician recommends that you schedule a follow-up appointment in: 3 months with Dr Harrington Challenger  Your physician recommends that you schedule a follow-up appointment in: WITH PHARMACIST ON SAME DAY AS LEXISCAN-TO DISCUSS CURRENT MEDICATION   Any Other Special Instructions Will Be Listed Below (If Applicable).  PLEASE BRING ALL OF YOUR MEDICATION WITH YOU TO YOUR NEXT VISIT   If you need a refill on your cardiac medications before your next appointment, please call your pharmacy.

## 2017-02-12 ENCOUNTER — Other Ambulatory Visit (INDEPENDENT_AMBULATORY_CARE_PROVIDER_SITE_OTHER): Payer: Medicare Other

## 2017-02-12 ENCOUNTER — Ambulatory Visit (INDEPENDENT_AMBULATORY_CARE_PROVIDER_SITE_OTHER): Payer: Medicare Other | Admitting: Internal Medicine

## 2017-02-12 ENCOUNTER — Encounter: Payer: Self-pay | Admitting: Internal Medicine

## 2017-02-12 VITALS — BP 184/84 | HR 60 | Temp 97.7°F | Resp 16 | Wt 133.0 lb

## 2017-02-12 DIAGNOSIS — I10 Essential (primary) hypertension: Secondary | ICD-10-CM

## 2017-02-12 DIAGNOSIS — E1140 Type 2 diabetes mellitus with diabetic neuropathy, unspecified: Secondary | ICD-10-CM

## 2017-02-12 DIAGNOSIS — I25810 Atherosclerosis of coronary artery bypass graft(s) without angina pectoris: Secondary | ICD-10-CM | POA: Diagnosis not present

## 2017-02-12 DIAGNOSIS — F3289 Other specified depressive episodes: Secondary | ICD-10-CM | POA: Diagnosis not present

## 2017-02-12 DIAGNOSIS — E0841 Diabetes mellitus due to underlying condition with diabetic mononeuropathy: Secondary | ICD-10-CM

## 2017-02-12 DIAGNOSIS — K219 Gastro-esophageal reflux disease without esophagitis: Secondary | ICD-10-CM

## 2017-02-12 DIAGNOSIS — G3184 Mild cognitive impairment, so stated: Secondary | ICD-10-CM

## 2017-02-12 DIAGNOSIS — M48061 Spinal stenosis, lumbar region without neurogenic claudication: Secondary | ICD-10-CM | POA: Diagnosis not present

## 2017-02-12 DIAGNOSIS — E7800 Pure hypercholesterolemia, unspecified: Secondary | ICD-10-CM

## 2017-02-12 LAB — COMPREHENSIVE METABOLIC PANEL
ALT: 17 U/L (ref 0–35)
AST: 21 U/L (ref 0–37)
Albumin: 4.1 g/dL (ref 3.5–5.2)
Alkaline Phosphatase: 68 U/L (ref 39–117)
BUN: 18 mg/dL (ref 6–23)
CO2: 29 mEq/L (ref 19–32)
Calcium: 8.9 mg/dL (ref 8.4–10.5)
Chloride: 97 mEq/L (ref 96–112)
Creatinine, Ser: 0.91 mg/dL (ref 0.40–1.20)
GFR: 75.62 mL/min (ref >60.00)
Glucose, Bld: 114 mg/dL — ABNORMAL HIGH (ref 70–99)
Potassium: 3.7 mEq/L (ref 3.5–5.1)
Sodium: 135 mEq/L (ref 135–145)
Total Bilirubin: 0.6 mg/dL (ref 0.2–1.2)
Total Protein: 6.7 g/dL (ref 6.0–8.3)

## 2017-02-12 LAB — CBC WITH DIFFERENTIAL/PLATELET
Basophils Absolute: 0 10*3/uL (ref 0.0–0.1)
Basophils Relative: 0.5 % (ref 0.0–3.0)
Eosinophils Absolute: 0.1 10*3/uL (ref 0.0–0.7)
Eosinophils Relative: 0.8 % (ref 0.0–5.0)
HCT: 41.6 % (ref 36.0–46.0)
Hemoglobin: 13.5 g/dL (ref 12.0–15.0)
Lymphocytes Relative: 34.3 % (ref 12.0–46.0)
Lymphs Abs: 2.4 10*3/uL (ref 0.7–4.0)
MCHC: 32.5 g/dL (ref 30.0–36.0)
MCV: 96.1 fl (ref 78.0–100.0)
Monocytes Absolute: 0.6 10*3/uL (ref 0.1–1.0)
Monocytes Relative: 8.8 % (ref 3.0–12.0)
Neutro Abs: 3.9 10*3/uL (ref 1.4–7.7)
Neutrophils Relative %: 55.6 % (ref 43.0–77.0)
Platelets: 219 10*3/uL (ref 150.0–400.0)
RBC: 4.33 Mil/uL (ref 3.87–5.11)
RDW: 13.9 % (ref 11.5–15.5)
WBC: 7.1 10*3/uL (ref 4.0–10.5)

## 2017-02-12 LAB — TSH: TSH: 1.07 u[IU]/mL (ref 0.35–4.50)

## 2017-02-12 LAB — HEMOGLOBIN A1C: Hgb A1c MFr Bld: 5.8 % (ref 4.6–6.5)

## 2017-02-12 MED ORDER — DULOXETINE HCL 30 MG PO CPEP: 30.0000 mg | ORAL_CAPSULE | Freq: Every day | ORAL | 1 refills | Status: DC

## 2017-02-12 NOTE — Patient Instructions (Addendum)
  Test(s) ordered today. Your results will be released to [place] (or called to you) after review, usually within 72hours after test completion. If any changes need to be made, you will be notified at that same time.   Medications reviewed and updated.  No changes recommended at this time.  Your prescription(s) have been submitted to your pharmacy. Please take as directed and contact our office if you believe you are having problem(s) with the medication(s).  A referral was ordered for a Education officer, museum with [facility].    Please followup in 6 months

## 2017-02-12 NOTE — Progress Notes (Signed)
Subjective:    Patient ID: [name], female    DOB: 1930/05/02, 83 y.o.   MRN: [id number]  HPI The patient is here for follow up.  Diabetes: She is controlling her sugars with diet. She is compliant with a diabetic diet. She is not exercising regularly.    Hypertension: She is taking her medication daily. She is compliant with a low sodium diet.  She denies chest pain, palpitations, edema, shortness of breath and regular headaches. She is not exercising regularly.  She does not monitor her blood pressure at home.    Hyperlipidemia: She is taking her medication daily. She is compliant with a low fat/cholesterol diet. She is not exercising regularly.   GERD:  She is taking her medication daily as prescribed.  She denies any GERD symptoms and feels her GERD is well controlled.   Depression: She is taking her medication daily as prescribed. She denies any side effects from the medication. She feels her depression is controlled and she is happy with her current dose of medication.   Right Hip bursitis:  She gets periodically injections in the past.    Back pain, stenosis:  She is following with orthopedics is getting intrathecal injections that help.  She is no seeing pain management.  She is taking tylenol only.  As the pain increases she has more difficulty standing long time and is eating less and has lost weight.  Her daughter thinks she would benefit from counseling to help her deal with the pain.   Memory difficulties:  She follows with neurology and is taking aricept.  She feels her memory is getting worse.  Her daughter notes some worsening of her memory over the past 6 months.   Medications and allergies reviewed with patient and updated if appropriate.  Patient Active Problem List   Diagnosis Date Noted  . Buttock pain 10/30/2016  . Chronic RUQ pain 05/28/2016  . Mild cognitive impairment 12/12/2015  . Right knee pain 06/14/2015  . Diabetic neuropathy ([place]) 03/21/2014    . Bunion 03/21/2014  . PVD (pulmonary valve disease) 01/23/2014  . Onychomycosis 12/20/2013  . PVD (peripheral vascular disease) ([place]) 12/19/2013  . Gout 12/13/2013  . Depression   . Spinal stenosis of lumbar region 04/24/2013  . Arthritis of shoulder region, left 09/11/2011  . Type 2 diabetes mellitus ([place]) 01/17/2009  . Anemia 10/15/2008  . CAD, NATIVE VESSEL 06/09/2008  . Hyperlipidemia 06/15/2007  . Essential hypertension 06/15/2007  . GERD 06/15/2007  . Fibromyalgia syndrome 06/15/2007  . 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 DIVERTICULOSIS, 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    Current Outpatient Prescriptions on File Prior to Visit  Medication Sig Dispense Refill  . amLODipine (NORVASC) 5 MG tablet TAKE 1 TABLET (5 MG TOTAL) BY MOUTH 2 (TWO) TIMES DAILY. 60 tablet 0  . aspirin EC 81 MG tablet Take 81 mg by mouth daily.    Marland Kitchen atorvastatin (LIPITOR) 40 MG tablet TAKE 1 TABLET (40 MG TOTAL) BY MOUTH DAILY. 30 tablet 0  . carvedilol (COREG) 6.25 MG tablet TAKE ONE TABLET BY MOUTH TWICE A DAY WITH A MEAL 180 tablet 1  . cetirizine (ZYRTEC) 10 MG tablet TAKE 1 TABLET (10 MG TOTAL) BY MOUTH DAILY. 30 tablet 10  . docusate sodium (COLACE) 100 MG capsule Take 2 capsules (200 mg total) by mouth daily. (Patient taking differently: Take 100 mg by mouth 2 (two) times daily. ) 60 capsule 5  . donepezil (ARICEPT) 10 MG tablet Take 1 tablet daily  90 tablet 3  . gabapentin (NEURONTIN) 300 MG capsule TAKE 1 CAPSULE (300 MG TOTAL) BY MOUTH AT BEDTIME. TO TAKE IN ADDITION TO THE DAYTIME DOSING 90 capsule 1  . hydrochlorothiazide (HYDRODIURIL) 12.5 MG tablet Take 1 tablet (12.5 mg total) by mouth daily. 90 tablet 3  . meloxicam (MOBIC) 7.5 MG tablet TAKE ONE TABLET BY MOUTH DAILY WITH FOOD 90 tablet 0  . omeprazole (PRILOSEC) 40 MG capsule TAKE 1 CAPSULE (40 MG TOTAL) BY MOUTH DAILY ON AN EMPTY STOMACH 90 capsule 3  . ramipril (ALTACE) 10 MG capsule Take 1 capsule (10 mg total) by mouth 2 (two) times daily.  180 capsule 3   No current facility-administered medications on file prior to visit.     Past Medical History:  Diagnosis Date  . CAD, NATIVE VESSEL 06/09/2008  . DIABETES MELLITUS, TYPE II   . FIBROMYALGIA   . GERD   . Gout   . HIATAL HERNIA   . HYPERLIPIDEMIA   . HYPERTENSION   . Insomnia   . OA (osteoarthritis)    severe, shoulders, hands - inflammatory, ?RA  . S/P CABG x 1   . S/P inguinal hernia repair   . TUBULOVILLOUS ADENOMA, 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   colo 04/2012 - no polyps - no further screening planned (age >67)  . UNSPECIFIED ANEMIA     Past Surgical History:  Procedure Laterality Date  . ABDOMINAL HYSTERECTOMY  1981  . APPENDECTOMY    . CATARACT EXTRACTION, BILATERAL    . CORONARY ARTERY BYPASS GRAFT  2010  . HERNIA REPAIR     INGUINAL  . JOINT REPLACEMENT    . OVARIAN CYST REMOVAL    . REPLACEMENT TOTAL KNEE BILATERAL Bilateral    Rt-1974, Lt -1989  . REVERSE SHOULDER ARTHROPLASTY  09/08/2011   Procedure: REVERSE SHOULDER ARTHROPLASTY;  Surgeon: Nita Sells, MD;  Location: [facility];  Service: Orthopedics;  Laterality: Left;  left reverse total shoulder arthroplasty  . ROTATOR CUFF REPAIR     Left    Social History   Social History  . Marital status: Widowed    Spouse name: N/A  . Number of children: N/A  . Years of education: N/A   Social History Main Topics  . Smoking status: Former Smoker    Quit date: 04/26/1970  . Smokeless tobacco: Never Used  . Alcohol use 0.6 oz/week    1 Glasses of wine per week  . Drug use: No  . Sexual activity: Not Asked   Other Topics Concern  . 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 Cancer Mother        Stomach  . Stomach cancer Mother 43  . Hypertension Mother   . Heart disease Father 56       MI  . Diabetes Father   . Heart attack Father   . Diabetes Daughter   . Hypertension Daughter    . Vascular Disease Sister   . Stroke Sister   . Dementia Sister   . Diabetes Sister   . Parkinsonism Sister   . Stroke Sister   . Heart disease Sister   . Heart attack Brother   . Anesthesia problems Neg Hx   . Colon cancer Neg Hx     Review of Systems  Constitutional: Negative for chills and fever.  Respiratory: Negative for cough, shortness of breath and  wheezing.   Cardiovascular: Negative for chest pain, palpitations and leg swelling.  Musculoskeletal: Positive for arthralgias and back pain.  Neurological: Negative for light-headedness and headaches.       Objective:   Vitals:   02/12/17 1338  BP: (!) 184/84  Pulse: 60  Resp: 16  Temp: 97.7 F (36.5 C)  SpO2: 98%   Wt Readings from Last 3 Encounters:  02/12/17 133 lb (60.3 kg)  02/08/17 133 lb (60.3 kg)  10/30/16 137 lb (62.1 kg)   Body mass index is 26.86 kg/m.   Physical Exam    Constitutional: Appears well-developed and well-nourished. No distress.  HENT:  Head: Normocephalic and atraumatic.  Neck: Neck supple. No tracheal deviation present. No thyromegaly present.  No cervical lymphadenopathy Cardiovascular: Normal rate, regular rhythm and normal heart sounds.   No murmur heard. No carotid bruit .  No edema Pulmonary/Chest: Effort normal and breath sounds normal. No respiratory distress. No has no wheezes. No rales.  Skin: Skin is warm and dry. Not diaphoretic.  Psychiatric: Normal mood and affect. Behavior is normal.      Assessment & Plan:    See Problem List for Assessment and Plan of chronic medical problems.

## 2017-02-13 ENCOUNTER — Other Ambulatory Visit: Payer: Self-pay | Admitting: Internal Medicine

## 2017-02-13 ENCOUNTER — Encounter: Payer: Self-pay | Admitting: Internal Medicine

## 2017-02-14 ENCOUNTER — Encounter: Payer: Self-pay | Admitting: Internal Medicine

## 2017-02-14 NOTE — Assessment & Plan Note (Signed)
Diet controlled Check a1c Low sugar / carb diet    

## 2017-02-14 NOTE — Assessment & Plan Note (Signed)
BP not controlled here ? Taking all her meds - her daughter will check to make sure she is taking all her meds Her daughter will start monitoring it regularly at home Continue current medications for now - ? Taking all of them Cmp, tsh

## 2017-02-14 NOTE — Assessment & Plan Note (Signed)
Continue statin. 

## 2017-02-14 NOTE — Assessment & Plan Note (Signed)
Controlled, stable Continue current dose of medication  

## 2017-02-14 NOTE — Assessment & Plan Note (Signed)
Taking cymbalta and gabapentin Controlled Continue above

## 2017-02-14 NOTE — Assessment & Plan Note (Signed)
Mild worsening Following with Dr Delice Lesch - next appt in March  Taking aricept Discussed safety issues Will order [facility] SW for community resources and to consider options for future

## 2017-02-14 NOTE — Assessment & Plan Note (Signed)
GERD controlled Continue daily medication  

## 2017-02-14 NOTE — Assessment & Plan Note (Signed)
Following with ortho Taking tylenol Her daughter will look into integrative services for more compressive approach to back pain - PT, therapy

## 2017-02-19 ENCOUNTER — Other Ambulatory Visit: Payer: Self-pay | Admitting: Internal Medicine

## 2017-02-19 ENCOUNTER — Telehealth: Payer: Self-pay | Admitting: *Deleted

## 2017-02-19 DIAGNOSIS — E785 Hyperlipidemia, unspecified: Secondary | ICD-10-CM

## 2017-02-19 DIAGNOSIS — I25810 Atherosclerosis of coronary artery bypass graft(s) without angina pectoris: Secondary | ICD-10-CM

## 2017-02-19 NOTE — Telephone Encounter (Signed)
Called Dr. Guadlupe Spanish to discuss resources for her mother [name]. LVM requesting Dr. [name] Heck to contact nurse and informed that nurse will try to contact Dr. [name] Heck again within the next few days.

## 2017-02-21 NOTE — Telephone Encounter (Signed)
Thank you!   -Jill

## 2017-02-25 DIAGNOSIS — M4316 Spondylolisthesis, lumbar region: Secondary | ICD-10-CM | POA: Diagnosis not present

## 2017-02-25 DIAGNOSIS — M47817 Spondylosis without myelopathy or radiculopathy, lumbosacral region: Secondary | ICD-10-CM | POA: Diagnosis not present

## 2017-02-25 NOTE — Telephone Encounter (Signed)
Called patient's daughter back in response to the VM she left nurse. Ms. [name] Heck stated that she was unavailable to talk at this time and will try to call nurse back sometime today. Nurse will continue to try to contact daughter as well.

## 2017-03-01 ENCOUNTER — Ambulatory Visit: Payer: Medicare Other | Admitting: Internal Medicine

## 2017-03-02 ENCOUNTER — Telehealth (HOSPITAL_COMMUNITY): Payer: Self-pay | Admitting: *Deleted

## 2017-03-02 NOTE — Telephone Encounter (Signed)
Patient given detailed instructions per Myocardial Perfusion Study Information Sheet for the test on 03/04/17 at 1000. Patient notified to arrive 15 minutes early and that it is imperative to arrive on time for appointment to keep from having the test rescheduled.  If you need to cancel or reschedule your appointment, please call the office within 24 hours of your appointment. . Patient verbalized understanding.[name], [name]

## 2017-03-04 ENCOUNTER — Ambulatory Visit (HOSPITAL_COMMUNITY): Payer: Medicare Other | Attending: Cardiology

## 2017-03-04 ENCOUNTER — Ambulatory Visit (INDEPENDENT_AMBULATORY_CARE_PROVIDER_SITE_OTHER): Payer: Medicare Other | Admitting: Pharmacist

## 2017-03-04 VITALS — BP 120/60 | HR 60

## 2017-03-04 VITALS — Ht 59.0 in | Wt 133.0 lb

## 2017-03-04 DIAGNOSIS — I25810 Atherosclerosis of coronary artery bypass graft(s) without angina pectoris: Secondary | ICD-10-CM

## 2017-03-04 DIAGNOSIS — I10 Essential (primary) hypertension: Secondary | ICD-10-CM | POA: Diagnosis not present

## 2017-03-04 DIAGNOSIS — I25700 Atherosclerosis of coronary artery bypass graft(s), unspecified, with unstable angina pectoris: Secondary | ICD-10-CM | POA: Diagnosis not present

## 2017-03-04 DIAGNOSIS — E782 Mixed hyperlipidemia: Secondary | ICD-10-CM | POA: Diagnosis not present

## 2017-03-04 DIAGNOSIS — R9431 Abnormal electrocardiogram [ECG] [EKG]: Secondary | ICD-10-CM | POA: Insufficient documentation

## 2017-03-04 DIAGNOSIS — R110 Nausea: Secondary | ICD-10-CM

## 2017-03-04 LAB — MYOCARDIAL PERFUSION IMAGING
LV dias vol: 62 mL (ref 46–106)
LV sys vol: 25 mL
Peak HR: 68 {beats}/min
RATE: 0.31
Rest HR: 60 {beats}/min
SDS: 1
SRS: 4
SSS: 5
TID: 0.71

## 2017-03-04 MED ORDER — TECHNETIUM TC 99M TETROFOSMIN IV KIT
9.8000 | PACK | Freq: Once | INTRAVENOUS | Status: AC | PRN
  Administered 2017-03-04: 9.8 via INTRAVENOUS
  Filled 2017-03-04: qty 10

## 2017-03-04 MED ORDER — REGADENOSON 0.4 MG/5ML IV SOLN
0.4000 mg | Freq: Once | INTRAVENOUS | Status: AC
  Administered 2017-03-04: 0.4 mg via INTRAVENOUS

## 2017-03-04 MED ORDER — AMINOPHYLLINE 25 MG/ML IV SOLN
75.0000 mg | Freq: Once | INTRAVENOUS | Status: AC
  Administered 2017-03-04: 75 mg via INTRAVENOUS

## 2017-03-04 MED ORDER — TECHNETIUM TC 99M TETROFOSMIN IV KIT
31.1000 | PACK | Freq: Once | INTRAVENOUS | Status: AC | PRN
  Administered 2017-03-04: 31.1 via INTRAVENOUS
  Filled 2017-03-04: qty 32

## 2017-03-04 NOTE — Patient Instructions (Signed)
It was nice to meet you today!  Your blood pressure was excellent  Continue taking your current medications  Call Megan in the blood pressure clinic with any concerns [phone]

## 2017-03-04 NOTE — Progress Notes (Signed)
Patient ID: [name]                 DOB: Apr 18, 1931                      MRN: [id number]     HPI: [name] is a 83 y.o. female patient of Dr Harrington Challenger referred to pharmacy clinic by Robbie Lis, PA to discuss medications. PMH is significant for CAD s/p CABG in 2010, HTN, HLD, DM, fibromyalgia, chronic pain, and decline in memory. At last visit 1 month ago, pt was unsure of which medications she was taking and BP was elevated at 178/78. She was advised to keep a log of home BP readings and come in with all home medications today.  Pt presents to clinic today in good spirits with her daughter. She ambulates with a cane. Pt reports feeling well overall. Denies dizziness, blurred vision, falls, or headache. She uses a pill box at home to remember her medications. Her daughter has checked pt's BP a few times over the last few weeks using home wrist cuff - readings have ranged 443-154 systolic, which does not correlate with recent office visit BP checks. She states pt had cortisone injections the week prior to her recent office visits that may have affected her BP. She was also stressed that day from going to the wrong office for her visit. She does have back pain for which she uses Mobic daily. She took her BP medications this AM about 1 hour ago.  Current HTN meds: amlodipine 5mg  daily, carvedilol 6.25mg  BID, HCTZ 12.5mg  daily, ramipril 10mg  BID BP goal: <140/28mmHg due to age  Family History: The patient's family history includes Cancer in her mother; Dementia in her sister; Diabetes in her daughter, father, and sister; Heart attack in her brother and father; Heart disease in her sister; Heart disease (age of onset: 23) in her father; Hypertension in her daughter and mother; Parkinsonism in her sister; Stomach cancer (age of onset: 62) in her mother; Stroke in her sister and sister; Vascular Disease in her sister.   Social History: Former smoker, quit in 1972. Drinks 1 glass of wine per week.  Denies illicit drug use.  Diet: 1 cup of coffee each day. Sometimes adds sal tot food  Exercise: back pain and age limit exercise  Wt Readings from Last 3 Encounters:  02/12/17 133 lb (60.3 kg)  02/08/17 133 lb (60.3 kg)  10/30/16 137 lb (62.1 kg)   BP Readings from Last 3 Encounters:  02/12/17 (!) 184/84  02/08/17 (!) 178/78  10/30/16 (!) 162/94   Pulse Readings from Last 3 Encounters:  02/12/17 60  02/08/17 64  10/30/16 67    Renal function: CrCl cannot be calculated (Unknown ideal weight.).  Past Medical History:  Diagnosis Date  . CAD, NATIVE VESSEL 06/09/2008  . DIABETES MELLITUS, TYPE II   . FIBROMYALGIA   . GERD   . Gout   . HIATAL HERNIA   . HYPERLIPIDEMIA   . HYPERTENSION   . Insomnia   . OA (osteoarthritis)    severe, shoulders, hands - inflammatory, ?RA  . S/P CABG x 1   . S/P inguinal hernia repair   . TUBULOVILLOUS ADENOMA, 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   colo 04/2012 - no polyps - no further screening planned (age >46)  . UNSPECIFIED ANEMIA     Current Outpatient Medications on File Prior to Visit  Medication Sig Dispense Refill  . amLODipine (NORVASC) 5 MG tablet  TAKE 1 TABLET (5 MG TOTAL) BY MOUTH 2 (TWO) TIMES DAILY. 60 tablet 0  . aspirin EC 81 MG tablet Take 81 mg by mouth daily.    Marland Kitchen atorvastatin (LIPITOR) 40 MG tablet TAKE ONE TABLET BY MOUTH DAILY 30 tablet 10  . carvedilol (COREG) 6.25 MG tablet TAKE ONE TABLET BY MOUTH TWICE A DAY WITH A MEAL 180 tablet 1  . cetirizine (ZYRTEC) 10 MG tablet TAKE ONE TABLET BY MOUTH DAILY 90 tablet 3  . docusate sodium (COLACE) 100 MG capsule Take 2 capsules (200 mg total) by mouth daily. (Patient taking differently: Take 100 mg by mouth 2 (two) times daily. ) 60 capsule 5  . donepezil (ARICEPT) 10 MG tablet Take 1 tablet daily 90 tablet 3  . DULoxetine (CYMBALTA) 30 MG capsule Take 1 capsule (30 mg total) by mouth daily. 90 capsule 1  . gabapentin (NEURONTIN) 300 MG capsule TAKE 1 CAPSULE (300 MG TOTAL) BY MOUTH AT  BEDTIME. TO TAKE IN ADDITION TO THE DAYTIME DOSING 90 capsule 1  . hydrochlorothiazide (HYDRODIURIL) 12.5 MG tablet Take 1 tablet (12.5 mg total) by mouth daily. 90 tablet 3  . meloxicam (MOBIC) 7.5 MG tablet TAKE ONE TABLET BY MOUTH DAILY WITH FOOD 90 tablet 0  . omeprazole (PRILOSEC) 40 MG capsule TAKE 1 CAPSULE (40 MG TOTAL) BY MOUTH DAILY ON AN EMPTY STOMACH 90 capsule 3  . ramipril (ALTACE) 10 MG capsule Take 1 capsule (10 mg total) by mouth 2 (two) times daily. 180 capsule 3   No current facility-administered medications on file prior to visit.     No Known Allergies   Assessment/Plan:  1. Hypertension - BP checked twice in clinic and excellent at 120/60 on each check. It's likely that stress, pain, and recent cortisone injections affected previous elevated clinic BP readings. Clinic reading today correlates with pt's home BP readings as well. Will continue amlodipine 5mg  daily, carvedilol 6.25mg  BID, HCTZ 12.5mg  daily, and ramipril 10mg  BID. Counseled pt on proper technique for checking BP using wrist cuff at home. F/u in HTN clinic as needed.   Megan E. Supple, PharmD, CPP, [facility] [address] [place], [place] [address] Phone: [phone]; Fax: [phone] 03/04/2017 10:04 AM

## 2017-03-25 DIAGNOSIS — M47817 Spondylosis without myelopathy or radiculopathy, lumbosacral region: Secondary | ICD-10-CM | POA: Diagnosis not present

## 2017-03-25 DIAGNOSIS — M7061 Trochanteric bursitis, right hip: Secondary | ICD-10-CM | POA: Diagnosis not present

## 2017-03-25 DIAGNOSIS — M4316 Spondylolisthesis, lumbar region: Secondary | ICD-10-CM | POA: Diagnosis not present

## 2017-04-01 DIAGNOSIS — M7061 Trochanteric bursitis, right hip: Secondary | ICD-10-CM | POA: Diagnosis not present

## 2017-04-05 ENCOUNTER — Ambulatory Visit: Payer: Medicare Other

## 2017-04-26 ENCOUNTER — Other Ambulatory Visit: Payer: Self-pay | Admitting: Internal Medicine

## 2017-04-29 ENCOUNTER — Other Ambulatory Visit: Payer: Self-pay | Admitting: Internal Medicine

## 2017-04-29 ENCOUNTER — Other Ambulatory Visit: Payer: Self-pay

## 2017-04-29 DIAGNOSIS — I25810 Atherosclerosis of coronary artery bypass graft(s) without angina pectoris: Secondary | ICD-10-CM

## 2017-04-29 DIAGNOSIS — G3184 Mild cognitive impairment, so stated: Secondary | ICD-10-CM

## 2017-04-29 DIAGNOSIS — E785 Hyperlipidemia, unspecified: Secondary | ICD-10-CM

## 2017-04-29 MED ORDER — DONEPEZIL HCL 10 MG PO TABS: ORAL_TABLET | ORAL | 3 refills | Status: DC

## 2017-05-13 ENCOUNTER — Encounter: Payer: Self-pay | Admitting: Internal Medicine

## 2017-05-13 ENCOUNTER — Ambulatory Visit (INDEPENDENT_AMBULATORY_CARE_PROVIDER_SITE_OTHER): Payer: Medicare Other | Admitting: Internal Medicine

## 2017-05-13 VITALS — BP 120/56 | HR 53 | Ht 59.0 in | Wt 132.0 lb

## 2017-05-13 DIAGNOSIS — E785 Hyperlipidemia, unspecified: Secondary | ICD-10-CM | POA: Diagnosis not present

## 2017-05-13 DIAGNOSIS — I10 Essential (primary) hypertension: Secondary | ICD-10-CM

## 2017-05-13 DIAGNOSIS — R413 Other amnesia: Secondary | ICD-10-CM

## 2017-05-13 DIAGNOSIS — I25810 Atherosclerosis of coronary artery bypass graft(s) without angina pectoris: Secondary | ICD-10-CM | POA: Diagnosis not present

## 2017-05-13 MED ORDER — OMEPRAZOLE 40 MG PO CPDR: DELAYED_RELEASE_CAPSULE | ORAL | 3 refills | Status: DC

## 2017-05-13 MED ORDER — RAMIPRIL 10 MG PO CAPS: 10.0000 mg | ORAL_CAPSULE | Freq: Two times a day (BID) | ORAL | 3 refills | Status: DC

## 2017-05-13 MED ORDER — CARVEDILOL 6.25 MG PO TABS: ORAL_TABLET | ORAL | 3 refills | Status: DC

## 2017-05-13 MED ORDER — AMLODIPINE BESYLATE 5 MG PO TABS: 5.0000 mg | ORAL_TABLET | Freq: Two times a day (BID) | ORAL | 3 refills | Status: DC

## 2017-05-13 MED ORDER — HYDROCHLOROTHIAZIDE 12.5 MG PO TABS: 12.5000 mg | ORAL_TABLET | Freq: Every day | ORAL | 3 refills | Status: DC

## 2017-05-13 MED ORDER — ATORVASTATIN CALCIUM 40 MG PO TABS: 40.0000 mg | ORAL_TABLET | Freq: Every day | ORAL | 3 refills | Status: DC

## 2017-05-13 NOTE — Progress Notes (Signed)
Cardiology Office Note   Date:  05/13/2017   ID:  [name], DOB 13-Nov-1930, MRN [id number]  PCP:  Binnie Rail, MD  Cardiologist:   Dorris Carnes, MD   F/U of CAD      History of Present Illness: [name] is a 84 y.o. female with a history of CAD (s/p CABG in 2010), HTN, HL and fibromyalgia  I saw her in September 2017   The patient in the interval was seen by B Bhagat  With mild EKG changes set up for myovue   Tis was negative for ischemia  The pt comes in with her daughter today   She denies CP  Breathing is fair   Continues to have problems with bursitis Stress in family as son in law died suddenly       Outpatient Medications Prior to Visit  Medication Sig Dispense Refill  . amLODipine (NORVASC) 5 MG tablet TAKE ONE TABLET BY MOUTH TWICE A DAY 90 tablet 3  . aspirin EC 81 MG tablet Take 81 mg by mouth daily.    Marland Kitchen atorvastatin (LIPITOR) 40 MG tablet TAKE ONE TABLET BY MOUTH DAILY 30 tablet 10  . carvedilol (COREG) 6.25 MG tablet TAKE ONE TABLET BY MOUTH TWICE A DAY WITH A MEAL 180 tablet 1  . cetirizine (ZYRTEC) 10 MG tablet TAKE ONE TABLET BY MOUTH DAILY 90 tablet 3  . docusate sodium (COLACE) 100 MG capsule Take 2 capsules (200 mg total) by mouth daily. (Patient taking differently: Take 100 mg by mouth 2 (two) times daily. ) 60 capsule 5  . donepezil (ARICEPT) 10 MG tablet Take 1 tablet daily 90 tablet 3  . DULoxetine (CYMBALTA) 30 MG capsule Take 1 capsule (30 mg total) by mouth daily. 90 capsule 1  . gabapentin (NEURONTIN) 300 MG capsule Take 300 mg 3 (three) times daily by mouth.    . hydrochlorothiazide (HYDRODIURIL) 12.5 MG tablet Take 1 tablet (12.5 mg total) by mouth daily. 90 tablet 3  . meloxicam (MOBIC) 7.5 MG tablet TAKE ONE TABLET BY MOUTH DAILY WITH FOOD 90 tablet 0  . omeprazole (PRILOSEC) 40 MG capsule TAKE 1 CAPSULE (40 MG TOTAL) BY MOUTH DAILY ON AN EMPTY STOMACH 90 capsule 3  . ramipril (ALTACE) 10 MG capsule Take 1 capsule (10 mg  total) by mouth 2 (two) times daily. 180 capsule 3   No facility-administered medications prior to visit.      Allergies:   Patient has no known allergies.   Past Medical History:  Diagnosis Date  . CAD, NATIVE VESSEL 06/09/2008  . DIABETES MELLITUS, TYPE II   . FIBROMYALGIA   . GERD   . Gout   . HIATAL HERNIA   . HYPERLIPIDEMIA   . HYPERTENSION   . Insomnia   . OA (osteoarthritis)    severe, shoulders, hands - inflammatory, ?RA  . S/P CABG x 1   . S/P inguinal hernia repair   . TUBULOVILLOUS ADENOMA, 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   colo 04/2012 - no polyps - no further screening planned (age >64)  . UNSPECIFIED ANEMIA     Past Surgical History:  Procedure Laterality Date  . ABDOMINAL HYSTERECTOMY  1981  . APPENDECTOMY    . CATARACT EXTRACTION, BILATERAL    . CORONARY ARTERY BYPASS GRAFT  2010  . HERNIA REPAIR     INGUINAL  . JOINT REPLACEMENT    . OVARIAN CYST REMOVAL    . REPLACEMENT TOTAL KNEE BILATERAL Bilateral    Rt-1974,  Lt -1989  . REVERSE SHOULDER ARTHROPLASTY  09/08/2011   Procedure: REVERSE SHOULDER ARTHROPLASTY;  Surgeon: Nita Sells, MD;  Location: [facility];  Service: Orthopedics;  Laterality: Left;  left reverse total shoulder arthroplasty  . ROTATOR CUFF REPAIR     Left     Social History:  The patient  reports that she quit smoking about 47 years ago. she has never used smokeless tobacco. She reports that she drinks about 0.6 oz of alcohol per week. She reports that she does not use drugs.   Family History:  The patient's family history includes Cancer in her mother; Dementia in her sister; Diabetes in her daughter, father, and sister; Heart attack in her brother and father; Heart disease in her sister; Heart disease (age of onset: 64) in her father; Hypertension in her daughter and mother; Parkinsonism in her sister; Stomach cancer (age of onset: 57) in her mother; Stroke in her sister and sister; Vascular Disease in her sister.    ROS:  Please see the  history of present illness. All other systems are reviewed and  Negative to the above problem except as noted.    PHYSICAL EXAM: VS:  BP (!) 120/56   Pulse (!) 53   Ht 4\' 11"  (1.499 m)   Wt 132 lb (59.9 kg)   SpO2 99%   BMI 26.66 kg/m   GEN: Well nourished, well developed, in no acute distress  HEENT: normal  Neck: JVP normal  No, carotid bruits, or masses Cardiac: RRR; no murmurs, rubs, or gallops,no edema  Respiratory:  clear to auscultation bilaterally, normal work of breathing GI: soft, nontender, nondistended, + BS  No hepatomegaly  MS: no deformity Moving all extremities   Skin: warm and dry, no rash Neuro:  Strength and sensation are intact Psych: euthymic mood, full affect   EKG:  EKG is not ordered today.   Lipid Panel    Component Value Date/Time   CHOL 176 05/28/2016 1052   TRIG 61.0 05/28/2016 1052   HDL 92.60 05/28/2016 1052   CHOLHDL 2 05/28/2016 1052   VLDL 12.2 05/28/2016 1052   LDLCALC 72 05/28/2016 1052   LDLDIRECT 115.3 09/30/2009 1059      Wt Readings from Last 3 Encounters:  05/13/17 132 lb (59.9 kg)  03/04/17 133 lb (60.3 kg)  02/12/17 133 lb (60.3 kg)      ASSESSMENT AND PLAN:  1  CAD  No symptoms to sugg angina  REcent myovue without ischemia    2  HL  LDL in June was 58  Keep on same meds  3.  HTN  BP is good    4  Memory  Pt continues to live alone  Does not drive     Encouraged her to ask for help so that she does not fall  Disposition:   FU with  in June 2018  Signed, Dorris Carnes, MD  05/13/2017 3:13 PM    [address], [place], [place]  [address] Phone: [phone]; Fax: [phone]

## 2017-05-13 NOTE — Patient Instructions (Signed)
Medication Instructions:  Your physician recommends that you continue on your current medications as directed. Please refer to the Current Medication list given to you today.   Labwork: None ordered  Testing/Procedures: None ordered  Follow-Up: Your physician wants you to follow-up in: July with Dr. Harrington Challenger. You will receive a reminder letter in the mail two months in advance. If you don't receive a letter, please call our office to schedule the follow-up appointment.   Any Other Special Instructions Will Be Listed Below (If Applicable).     If you need a refill on your cardiac medications before your next appointment, please call your pharmacy.

## 2017-05-26 ENCOUNTER — Ambulatory Visit
Admission: RE | Admit: 2017-05-26 | Discharge: 2017-05-26 | Disposition: A | Discharge status: Home / Self Care | Payer: Medicare Other | Source: Ambulatory Visit | Attending: Internal Medicine | Admitting: Internal Medicine

## 2017-05-26 DIAGNOSIS — Z1231 Encounter for screening mammogram for malignant neoplasm of breast: Secondary | ICD-10-CM | POA: Diagnosis not present

## 2017-05-27 ENCOUNTER — Telehealth: Payer: Self-pay | Admitting: Internal Medicine

## 2017-05-27 NOTE — Telephone Encounter (Signed)
Spoke with pts daughter, advised what current medication list states. Meds were sent by Cardiology, Dr Harrington Challenger. Also gave her Cardiologys number to verify if needed.

## 2017-05-27 NOTE — Telephone Encounter (Signed)
Copied from [facility]. Topic: Quick Communication - See Telephone Encounter >> May 27, 2017  8:08 AM Synthia Innocent wrote: CRM for notification. See Telephone encounter for: Would like to speak to nurse regarding when she is to take hydrochlorothiazide (HYDRODIURIL) 12.5 MG tablet and how many ramipril (ALTACE) 10 MG capsule. Please advise  05/27/17.

## 2017-06-03 ENCOUNTER — Telehealth: Payer: Self-pay | Admitting: Internal Medicine

## 2017-06-03 NOTE — Telephone Encounter (Signed)
Copied from [facility]. Topic: Quick Communication - See Telephone Encounter >> Jun 03, 2017  9:48 AM Margot Ables wrote: CRM for notification. See Telephone encounter for: 06/03/17.  Request for back and shoulder brace was faxed last week, he will refax today to [phone]

## 2017-06-03 NOTE — Telephone Encounter (Signed)
Spoke with Pt, she is not requesting a back or shoulder brace. Order request have been shredded.

## 2017-06-10 ENCOUNTER — Ambulatory Visit: Payer: Medicare Other | Admitting: Internal Medicine

## 2017-06-26 ENCOUNTER — Encounter: Payer: Self-pay | Admitting: Internal Medicine

## 2017-06-28 MED ORDER — RAMIPRIL 10 MG PO CAPS: 10.0000 mg | ORAL_CAPSULE | Freq: Two times a day (BID) | ORAL | 3 refills | Status: DC

## 2017-07-06 NOTE — Progress Notes (Deleted)
[facility] [place], [place] [address] Phone: [phone] Subjective:    I'm seeing this patient by the request  of:  Binnie Rail, MD   CC: Back pain  [id number]  [name] is a 84 y.o. female coming in with complaint of back pain.  Onset-  Location Duration-  Character- Aggravating factors- Reliving factors-  Therapies tried-  Severity-   Lumbar spine was taken June 2017.  Independently visualized by me.  Severe spinal stenosis at L5-S1 with right L5 nerve root encroachment.  Moderate spinal stenosis at L4-L5.  Past Medical History:  Diagnosis Date  . CAD, NATIVE VESSEL 06/09/2008  . DIABETES MELLITUS, TYPE II   . FIBROMYALGIA   . GERD   . Gout   . HIATAL HERNIA   . HYPERLIPIDEMIA   . HYPERTENSION   . Insomnia   . OA (osteoarthritis)    severe, shoulders, hands - inflammatory, ?RA  . S/P CABG x 1   . S/P inguinal hernia repair   . TUBULOVILLOUS ADENOMA, 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   colo 04/2012 - no polyps - no further screening planned (age >34)  . UNSPECIFIED ANEMIA    Past Surgical History:  Procedure Laterality Date  . ABDOMINAL HYSTERECTOMY  1981  . APPENDECTOMY    . CATARACT EXTRACTION, BILATERAL    . CORONARY ARTERY BYPASS GRAFT  2010  . HERNIA REPAIR     INGUINAL  . JOINT REPLACEMENT    . OVARIAN CYST REMOVAL    . REPLACEMENT TOTAL KNEE BILATERAL Bilateral    Rt-1974, Lt -1989  . REVERSE SHOULDER ARTHROPLASTY  09/08/2011   Procedure: REVERSE SHOULDER ARTHROPLASTY;  Surgeon: Nita Sells, MD;  Location: [facility];  Service: Orthopedics;  Laterality: Left;  left reverse total shoulder arthroplasty  . ROTATOR CUFF REPAIR     Left   Social History   Socioeconomic History  . Marital status: Widowed    Spouse name: Not on file  . Number of children: Not on file  . Years of education: Not on file  . Highest education level: Not on file  Social Needs  . Financial resource strain: Not on file  .  Food insecurity - worry: Not on file  . Food insecurity - inability: Not on file  . Transportation needs - medical: Not on file  . Transportation needs - non-medical: Not on file  Occupational History  . Not on file  Tobacco Use  . Smoking status: Former Smoker    Last attempt to quit: 04/26/1970    Years since quitting: 47.2  . Smokeless tobacco: Never Used  Substance and Sexual Activity  . Alcohol use: Yes    Alcohol/week: 0.6 oz    Types: 1 Glasses of wine per week  . Drug use: No  . Sexual activity: Not on file  Other Topics Concern  . Not on file  Social History Narrative   Widows, lives alone. 2 sons. 2 daughters. Retired Ambulance person   No Known Allergies Family History  Problem Relation Age of Onset  . Cancer Mother        Stomach  . Stomach cancer Mother 6  . Hypertension Mother   . Heart disease Father 78       MI  . Diabetes Father   . Heart attack Father   . Diabetes Daughter   . Hypertension Daughter   . Vascular Disease Sister   . Stroke Sister   . Dementia Sister   .  Diabetes Sister   . Parkinsonism Sister   . Stroke Sister   . Heart disease Sister   . Heart attack Brother   . Anesthesia problems Neg Hx   . Colon cancer Neg Hx      Past medical history, social, surgical and family history all reviewed in electronic medical record.  No pertanent information unless stated regarding to the chief complaint.   Review of Systems:Review of systems updated and as accurate as of 07/06/17  No headache, visual changes, nausea, vomiting, diarrhea, constipation, dizziness, abdominal pain, skin rash, fevers, chills, night sweats, weight loss, swollen lymph nodes, body aches, joint swelling, muscle aches, chest pain, shortness of breath, mood changes.   Objective  There were no vitals taken for this visit. Systems examined below as of 07/06/17   General: No apparent distress alert and oriented x3 mood and affect normal, dressed appropriately.  HEENT: Pupils  equal, extraocular movements intact  Respiratory: Patient's speak in full sentences and does not appear short of breath  Cardiovascular: No lower extremity edema, non tender, no erythema  Skin: Warm dry intact with no signs of infection or rash on extremities or on axial skeleton.  Abdomen: Soft nontender  Neuro: Cranial nerves II through XII are intact, neurovascularly intact in all extremities with 2+ DTRs and 2+ pulses.  Lymph: No lymphadenopathy of posterior or anterior cervical chain or axillae bilaterally.  Gait normal with good balance and coordination.  MSK:  Non tender with full range of motion and good stability and symmetric strength and tone of shoulders, elbows, wrist, hip, knee and ankles bilaterally.     Impression and Recommendations:     This case required medical decision making of moderate complexity.      Note: This dictation was prepared with Dragon dictation along with smaller phrase technology. Any transcriptional errors that result from this process are unintentional.

## 2017-07-07 ENCOUNTER — Ambulatory Visit: Payer: Medicare Other | Admitting: Family Medicine

## 2017-07-11 ENCOUNTER — Other Ambulatory Visit: Payer: Self-pay | Admitting: Internal Medicine

## 2017-07-11 NOTE — Progress Notes (Signed)
Subjective:    Patient ID: [name], female    DOB: [birth year], 84 y.o.   MRN: [id number]  HPI  Medications and allergies reviewed with patient and updated if appropriate.  Patient Active Problem List   Diagnosis Date Noted  . Buttock pain 10/30/2016  . Chronic RUQ pain 05/28/2016  . Mild cognitive impairment 12/12/2015  . Right knee pain 06/14/2015  . Diabetic neuropathy ([facility]) 03/21/2014  . Bunion 03/21/2014  . PVD (pulmonary valve disease) 01/23/2014  . Onychomycosis 12/20/2013  . PVD (peripheral vascular disease) ([facility]) 12/19/2013  . Gout 12/13/2013  . Depression   . Spinal stenosis of lumbar region 04/24/2013  . Arthritis of shoulder region, left 09/11/2011  . Type 2 diabetes mellitus ([facility]) 01/17/2009  . Anemia 10/15/2008  . CAD, NATIVE VESSEL 06/09/2008  . Hyperlipidemia 06/15/2007  . Essential hypertension 06/15/2007  . GERD 06/15/2007  . Fibromyalgia syndrome 06/15/2007  . 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 DIVERTICULOSIS, 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    Current Outpatient Medications on File Prior to Visit  Medication Sig Dispense Refill  . amLODipine (NORVASC) 5 MG tablet Take 1 tablet (5 mg total) by mouth 2 (two) times daily. 180 tablet 3  . aspirin EC 81 MG tablet Take 81 mg by mouth daily.    Marland Kitchen atorvastatin (LIPITOR) 40 MG tablet Take 1 tablet (40 mg total) by mouth daily. 90 tablet 3  . carvedilol (COREG) 6.25 MG tablet TAKE ONE TABLET BY MOUTH TWICE A DAY WITH A MEAL 180 tablet 3  . cetirizine (ZYRTEC) 10 MG tablet TAKE ONE TABLET BY MOUTH DAILY 90 tablet 3  . docusate sodium (COLACE) 100 MG capsule Take 2 capsules (200 mg total) by mouth daily. (Patient taking differently: Take 100 mg by mouth 2 (two) times daily. ) 60 capsule 5  . donepezil (ARICEPT) 10 MG tablet Take 1 tablet daily 90 tablet 3  . DULoxetine (CYMBALTA) 30 MG capsule Take 1 capsule (30 mg total) by mouth daily. 90 capsule 1  . gabapentin (NEURONTIN) 300 MG capsule Take 300 mg 3  (three) times daily by mouth.    . hydrochlorothiazide (HYDRODIURIL) 12.5 MG tablet Take 1 tablet (12.5 mg total) by mouth daily. 90 tablet 3  . meloxicam (MOBIC) 7.5 MG tablet TAKE ONE TABLET BY MOUTH DAILY WITH FOOD 90 tablet 0  . omeprazole (PRILOSEC) 40 MG capsule TAKE 1 CAPSULE (40 MG TOTAL) BY MOUTH DAILY ON AN EMPTY STOMACH 90 capsule 3  . ramipril (ALTACE) 10 MG capsule Take 1 capsule (10 mg total) by mouth 2 (two) times daily. 180 capsule 3   No current facility-administered medications on file prior to visit.     Past Medical History:  Diagnosis Date  . CAD, NATIVE VESSEL 06/09/2008  . DIABETES MELLITUS, TYPE II   . FIBROMYALGIA   . GERD   . Gout   . HIATAL HERNIA   . HYPERLIPIDEMIA   . HYPERTENSION   . Insomnia   . OA (osteoarthritis)    severe, shoulders, hands - inflammatory, ?RA  . S/P CABG x 1   . S/P inguinal hernia repair   . TUBULOVILLOUS ADENOMA, 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   colo 04/2012 - no polyps - no further screening planned (age >23)  . UNSPECIFIED ANEMIA     Past Surgical History:  Procedure Laterality Date  . ABDOMINAL HYSTERECTOMY  1981  . APPENDECTOMY    . CATARACT EXTRACTION, BILATERAL    . CORONARY ARTERY BYPASS GRAFT  2010  . HERNIA REPAIR  INGUINAL  . JOINT REPLACEMENT    . OVARIAN CYST REMOVAL    . REPLACEMENT TOTAL KNEE BILATERAL Bilateral    Rt-1974, Lt -1989  . REVERSE SHOULDER ARTHROPLASTY  09/08/2011   Procedure: REVERSE SHOULDER ARTHROPLASTY;  Surgeon: Nita Sells, MD;  Location: [facility];  Service: Orthopedics;  Laterality: Left;  left reverse total shoulder arthroplasty  . ROTATOR CUFF REPAIR     Left    Social History   Socioeconomic History  . Marital status: Widowed    Spouse name: Not on file  . Number of children: Not on file  . Years of education: Not on file  . Highest education level: Not on file  Occupational History  . Not on file  Social Needs  . Financial resource strain: Not on file  . Food insecurity:     Worry: Not on file    Inability: Not on file  . Transportation needs:    Medical: Not on file    Non-medical: Not on file  Tobacco Use  . Smoking status: Former Smoker    Last attempt to quit: 04/26/1970    Years since quitting: 47.2  . Smokeless tobacco: Never Used  Substance and Sexual Activity  . Alcohol use: Yes    Alcohol/week: 0.6 oz    Types: 1 Glasses of wine per week  . Drug use: No  . Sexual activity: Not on file  Lifestyle  . Physical activity:    Days per week: Not on file    Minutes per session: Not on file  . Stress: Not on file  Relationships  . Social connections:    Talks on phone: Not on file    Gets together: Not on file    Attends religious service: Not on file    Active member of club or organization: Not on file    Attends meetings of clubs or organizations: Not on file    Relationship status: Not on file  Other Topics Concern  . Not on file  Social History Narrative   Widows, lives alone. 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 Cancer Mother        Stomach  . Stomach cancer Mother 37  . Hypertension Mother   . Heart disease Father 61       MI  . Diabetes Father   . Heart attack Father   . Diabetes Daughter   . Hypertension Daughter   . Vascular Disease Sister   . Stroke Sister   . Dementia Sister   . Diabetes Sister   . Parkinsonism Sister   . Stroke Sister   . Heart disease Sister   . Heart attack Brother   . Anesthesia problems Neg Hx   . Colon cancer Neg Hx     Review of Systems     Objective:  There were no vitals filed for this visit. BP Readings from Last 3 Encounters:  05/13/17 (!) 120/56  03/04/17 120/60  02/12/17 (!) 184/84   Wt Readings from Last 3 Encounters:  05/13/17 132 lb (59.9 kg)  03/04/17 133 lb (60.3 kg)  02/12/17 133 lb (60.3 kg)   There is no height or weight on file to calculate BMI.   Physical Exam         Assessment & Plan:    See  Problem List for Assessment and Plan of chronic medical problems.   This encounter was created in error -  please disregard.

## 2017-07-11 NOTE — Patient Instructions (Signed)
  Test(s) ordered today. Your results will be released to MyChart (or called to you) after review, usually within 72hours after test completion. If any changes need to be made, you will be notified at that same time.  All other Health Maintenance issues reviewed.   All recommended immunizations and age-appropriate screenings are up-to-date or discussed.  No immunizations administered today.   Medications reviewed and updated.  Changes include  /  No changes recommended at this time.  Your prescription(s) have been submitted to your pharmacy. Please take as directed and contact our office if you believe you are having problem(s) with the medication(s).  A referral was ordered for   Please followup in    

## 2017-07-12 ENCOUNTER — Encounter: Payer: Medicare Other | Admitting: Internal Medicine

## 2017-07-14 ENCOUNTER — Ambulatory Visit (INDEPENDENT_AMBULATORY_CARE_PROVIDER_SITE_OTHER): Payer: Medicare Other | Admitting: Neurology

## 2017-07-14 ENCOUNTER — Encounter: Payer: Self-pay | Admitting: Neurology

## 2017-07-14 ENCOUNTER — Other Ambulatory Visit: Payer: Self-pay

## 2017-07-14 VITALS — BP 134/60 | HR 53 | Ht 59.0 in | Wt 137.0 lb

## 2017-07-14 DIAGNOSIS — I25810 Atherosclerosis of coronary artery bypass graft(s) without angina pectoris: Secondary | ICD-10-CM | POA: Diagnosis not present

## 2017-07-14 DIAGNOSIS — F0390 Unspecified dementia without behavioral disturbance: Secondary | ICD-10-CM

## 2017-07-14 DIAGNOSIS — F03A0 Unspecified dementia, mild, without behavioral disturbance, psychotic disturbance, mood disturbance, and anxiety: Secondary | ICD-10-CM

## 2017-07-14 MED ORDER — DONEPEZIL HCL 10 MG PO TABS: ORAL_TABLET | ORAL | 3 refills | Status: DC

## 2017-07-14 MED ORDER — MEMANTINE HCL 10 MG PO TABS: 10.0000 mg | ORAL_TABLET | Freq: Two times a day (BID) | ORAL | 3 refills | Status: DC

## 2017-07-14 NOTE — Patient Instructions (Addendum)
1. Start Namenda 10mg : Take 1 tablet twice a day 2. Continue Donepezil 10mg  daily 3. Follow-up in 6 months, call for any changes  FALL PRECAUTIONS: Be cautious when walking. Scan the area for obstacles that may increase the risk of trips and falls. When getting up in the mornings, sit up at the edge of the bed for a few minutes before getting out of bed. Consider elevating the bed at the head end to avoid drop of blood pressure when getting up. Walk always in a well-lit room (use night lights in the walls). Avoid area rugs or power cords from appliances in the middle of the walkways. Use a walker or a cane if necessary and consider physical therapy for balance exercise. Get your eyesight checked regularly.  FINANCIAL OVERSIGHT: Supervision, especially oversight when making financial decisions or transactions is also recommended.  HOME SAFETY: Consider the safety of the kitchen when operating appliances like stoves, microwave oven, and blender. Consider having supervision and share cooking responsibilities until no longer able to participate in those. Accidents with firearms and other hazards in the house should be identified and addressed as well.  DRIVING: Regarding driving, in patients with progressive memory problems, driving will be impaired. We advise to have someone else do the driving if trouble finding directions or if minor accidents are reported. Independent driving assessment is available to determine safety of driving.  ABILITY TO BE LEFT ALONE: If patient is unable to contact 911 operator, consider using LifeLine, or when the need is there, arrange for someone to stay with patients. Smoking is a fire hazard, consider supervision or cessation. Risk of wandering should be assessed by caregiver and if detected at any point, supervision and safe proof recommendations should be instituted.  MEDICATION SUPERVISION: Inability to self-administer medication needs to be constantly addressed.  Implement a mechanism to ensure safe administration of the medications.  RECOMMENDATIONS FOR ALL PATIENTS WITH MEMORY PROBLEMS: 1. Continue to exercise (Recommend 30 minutes of walking everyday, or 3 hours every week) 2. Increase social interactions - continue going to [place] and enjoy social gatherings with friends and family 3. Eat healthy, avoid fried foods and eat more fruits and vegetables 4. Maintain adequate blood pressure, blood sugar, and blood cholesterol level. Reducing the risk of stroke and cardiovascular disease also helps promoting better memory. 5. Avoid stressful situations. Live a simple life and avoid aggravations. Organize your time and prepare for the next day in anticipation. 6. Sleep well, avoid any interruptions of sleep and avoid any distractions in the bedroom that may interfere with adequate sleep quality 7. Avoid sugar, avoid sweets as there is a strong link between excessive sugar intake, diabetes, and cognitive impairment We discussed the Mediterranean diet, which has been shown to help patients reduce the risk of progressive memory disorders and reduces cardiovascular risk. This includes eating fish, eat fruits and green leafy vegetables, nuts like almonds and hazelnuts, walnuts, and also use olive oil. Avoid fast foods and fried foods as much as possible. Avoid sweets and sugar as sugar use has been linked to worsening of memory function.  There is always a concern of gradual progression of memory problems. If this is the case, then we may need to adjust level of care according to patient needs. Support, both to the patient and caregiver, should then be put into place.
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
## 2017-07-15 DIAGNOSIS — M7061 Trochanteric bursitis, right hip: Secondary | ICD-10-CM | POA: Diagnosis not present

## 2017-07-21 DIAGNOSIS — E118 Type 2 diabetes mellitus with unspecified complications: Secondary | ICD-10-CM | POA: Diagnosis not present

## 2017-07-27 ENCOUNTER — Encounter: Payer: Self-pay | Admitting: Neurology

## 2017-07-27 DIAGNOSIS — F03A0 Unspecified dementia, mild, without behavioral disturbance, psychotic disturbance, mood disturbance, and anxiety: Secondary | ICD-10-CM | POA: Insufficient documentation

## 2017-07-27 DIAGNOSIS — F03918 Unspecified dementia, unspecified severity, with other behavioral disturbance: Secondary | ICD-10-CM | POA: Insufficient documentation

## 2017-07-27 DIAGNOSIS — F01B3 Vascular dementia, moderate, with mood disturbance: Secondary | ICD-10-CM | POA: Insufficient documentation

## 2017-07-27 DIAGNOSIS — F0390 Unspecified dementia without behavioral disturbance: Secondary | ICD-10-CM | POA: Insufficient documentation

## 2017-08-15 NOTE — Progress Notes (Signed)
Subjective:    Patient ID: [name], female    DOB: [birth year], 84 y.o.   MRN: [id number]  HPI The patient is here for follow up.  Diabetes with neuropathy: She is controlling her sugars with diet.  She is compliant with a diabetic diet. She is not exercising regularly.She checks her feet daily and denies foot lesions. She is up-to-date with an ophthalmology examination.   Hypertension: She is taking her medication daily. She is compliant with a low sodium diet.  She denies chest pain, palpitations, edema, shortness of breath and regular headaches. She is not exercising regularly.  She does not monitor her blood pressure at home.    GERD:  She is taking her medication daily as prescribed.  She denies any GERD symptoms and feels her GERD is well controlled.   Depression: She ran out of her Cymbalta and did have some mild withdrawal symptoms.  Her daughter is a Teacher, music and recognize this.  The prescription is at the pharmacy and they will pick that up today.  Overall her mood is good with the medication.  If she goes too long without medication she will become depressed.  There is no current depression.  She denies any side effects from the medication.   Hyperlipidemia: She is taking her medication daily. She is compliant with a low fat/cholesterol diet. She is exercising regularly. She denies myalgias.   Memory difficulties:  Back pain,stenosis:  She is taking tylenol.    Medications and allergies reviewed with patient and updated if appropriate.  Patient Active Problem List   Diagnosis Date Noted  . Mild dementia 07/27/2017  . Buttock pain 10/30/2016  . Chronic RUQ pain 05/28/2016  . Mild cognitive impairment 12/12/2015  . Right knee pain 06/14/2015  . Diabetic neuropathy ([place]) 03/21/2014  . Bunion 03/21/2014  . PVD (pulmonary valve disease) 01/23/2014  . Onychomycosis 12/20/2013  . PVD (peripheral vascular disease) ([place]) 12/19/2013  . Gout 12/13/2013  .  Depression   . Spinal stenosis of lumbar region 04/24/2013  . Arthritis of shoulder region, left 09/11/2011  . Type 2 diabetes mellitus ([place]) 01/17/2009  . Anemia 10/15/2008  . CAD, NATIVE VESSEL 06/09/2008  . Hyperlipidemia 06/15/2007  . Essential hypertension 06/15/2007  . GERD 06/15/2007  . Fibromyalgia syndrome 06/15/2007  . 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 DIVERTICULOSIS, 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    Current Outpatient Medications on File Prior to Visit  Medication Sig Dispense Refill  . amLODipine (NORVASC) 5 MG tablet Take 1 tablet (5 mg total) by mouth 2 (two) times daily. 180 tablet 3  . aspirin EC 81 MG tablet Take 81 mg by mouth daily.    Marland Kitchen atorvastatin (LIPITOR) 40 MG tablet Take 1 tablet (40 mg total) by mouth daily. 90 tablet 3  . carvedilol (COREG) 6.25 MG tablet TAKE ONE TABLET BY MOUTH TWICE A DAY WITH A MEAL 180 tablet 3  . cetirizine (ZYRTEC) 10 MG tablet TAKE ONE TABLET BY MOUTH DAILY 90 tablet 3  . docusate sodium (COLACE) 100 MG capsule Take 2 capsules (200 mg total) by mouth daily. (Patient taking differently: Take 100 mg by mouth 2 (two) times daily. ) 60 capsule 5  . donepezil (ARICEPT) 10 MG tablet Take 1 tablet daily 90 tablet 3  . DULoxetine (CYMBALTA) 30 MG capsule Take 1 capsule (30 mg total) by mouth daily. 30 capsule 0  . gabapentin (NEURONTIN) 300 MG capsule Take 300 mg 3 (three) times daily by mouth.    Marland Kitchen  gabapentin (NEURONTIN) 300 MG capsule Take 1 capsule (300 mg total) by mouth at bedtime. 90 capsule 0  . hydrochlorothiazide (HYDRODIURIL) 12.5 MG tablet Take 1 tablet (12.5 mg total) by mouth daily. 90 tablet 3  . meloxicam (MOBIC) 7.5 MG tablet TAKE ONE TABLET BY MOUTH DAILY WITH FOOD 30 tablet 0  . memantine (NAMENDA) 10 MG tablet Take 1 tablet (10 mg total) by mouth 2 (two) times daily. 180 tablet 3  . omeprazole (PRILOSEC) 40 MG capsule TAKE 1 CAPSULE (40 MG TOTAL) BY MOUTH DAILY ON AN EMPTY STOMACH 90 capsule 3  . ramipril (ALTACE) 10 MG capsule  Take 1 capsule (10 mg total) by mouth 2 (two) times daily. 180 capsule 3   No current facility-administered medications on file prior to visit.     Past Medical History:  Diagnosis Date  . CAD, NATIVE VESSEL 06/09/2008  . DIABETES MELLITUS, TYPE II   . FIBROMYALGIA   . GERD   . Gout   . HIATAL HERNIA   . HYPERLIPIDEMIA   . HYPERTENSION   . Insomnia   . OA (osteoarthritis)    severe, shoulders, hands - inflammatory, ?RA  . S/P CABG x 1   . S/P inguinal hernia repair   . TUBULOVILLOUS ADENOMA, 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   colo 04/2012 - no polyps - no further screening planned (age >67)  . UNSPECIFIED ANEMIA     Past Surgical History:  Procedure Laterality Date  . ABDOMINAL HYSTERECTOMY  1981  . APPENDECTOMY    . CATARACT EXTRACTION, BILATERAL    . CORONARY ARTERY BYPASS GRAFT  2010  . HERNIA REPAIR     INGUINAL  . JOINT REPLACEMENT    . OVARIAN CYST REMOVAL    . REPLACEMENT TOTAL KNEE BILATERAL Bilateral    Rt-1974, Lt -1989  . REVERSE SHOULDER ARTHROPLASTY  09/08/2011   Procedure: REVERSE SHOULDER ARTHROPLASTY;  Surgeon: Nita Sells, MD;  Location: [facility];  Service: Orthopedics;  Laterality: Left;  left reverse total shoulder arthroplasty  . ROTATOR CUFF REPAIR     Left    Social History   Socioeconomic History  . Marital status: Widowed    Spouse name: Not on file  . Number of children: Not on file  . Years of education: Not on file  . Highest education level: Not on file  Occupational History  . Not on file  Social Needs  . Financial resource strain: Not on file  . Food insecurity:    Worry: Not on file    Inability: Not on file  . Transportation needs:    Medical: Not on file    Non-medical: Not on file  Tobacco Use  . Smoking status: Former Smoker    Last attempt to quit: 04/26/1970    Years since quitting: 47.3  . Smokeless tobacco: Never Used  Substance and Sexual Activity  . Alcohol use: Yes    Alcohol/week: 0.6 oz    Types: 1 Glasses of wine  per week  . Drug use: No  . Sexual activity: Not on file  Lifestyle  . Physical activity:    Days per week: Not on file    Minutes per session: Not on file  . Stress: Not on file  Relationships  . Social connections:    Talks on phone: Not on file    Gets together: Not on file    Attends religious service: Not on file    Active member of club or organization: Not on file  Attends meetings of clubs or organizations: Not on file    Relationship status: Not on file  Other Topics Concern  . Not on file  Social History Narrative   Widows, lives alone. 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 Cancer Mother        Stomach  . Stomach cancer Mother 57  . Hypertension Mother   . Heart disease Father 65       MI  . Diabetes Father   . Heart attack Father   . Diabetes Daughter   . Hypertension Daughter   . Vascular Disease Sister   . Stroke Sister   . Dementia Sister   . Diabetes Sister   . Parkinsonism Sister   . Stroke Sister   . Heart disease Sister   . Heart attack Brother   . Anesthesia problems Neg Hx   . Colon cancer Neg Hx     Review of Systems  Constitutional: Negative for appetite change, chills and fever.  Respiratory: Negative for cough, shortness of breath and wheezing.   Cardiovascular: Negative for chest pain, palpitations and leg swelling.  Gastrointestinal: Negative for abdominal pain, constipation, diarrhea and nausea.  Neurological: Negative for dizziness, light-headedness and headaches.       Objective:   Vitals:   08/19/17 1108  BP: 134/62  Pulse: 66  Resp: 16  Temp: 98.3 F (36.8 C)  SpO2: 95%   BP Readings from Last 3 Encounters:  08/19/17 134/62  07/14/17 134/60  05/13/17 (!) 120/56   Wt Readings from Last 3 Encounters:  08/19/17 136 lb (61.7 kg)  07/14/17 137 lb (62.1 kg)  05/13/17 132 lb (59.9 kg)   Body mass index is 27.47 kg/m.   Physical Exam    Constitutional:  Appears well-developed and well-nourished. No distress.  HENT:  Head: Normocephalic and atraumatic.  Neck: Neck supple. No tracheal deviation present. No thyromegaly present.  No cervical lymphadenopathy Cardiovascular: Normal rate, regular rhythm and normal heart sounds.   No murmur heard. No carotid bruit .  No edema Pulmonary/Chest: Effort normal and breath sounds normal. No respiratory distress. No has no wheezes. No rales.  Skin: Skin is warm and dry. Not diaphoretic.  Psychiatric: Normal mood and affect. Behavior is normal.      Assessment & Plan:    See Problem List for Assessment and Plan of chronic medical problems.

## 2017-08-16 ENCOUNTER — Ambulatory Visit: Payer: Self-pay | Admitting: *Deleted

## 2017-08-16 ENCOUNTER — Telehealth: Payer: Self-pay | Admitting: Internal Medicine

## 2017-08-16 MED ORDER — DULOXETINE HCL 30 MG PO CPEP: 30.0000 mg | ORAL_CAPSULE | Freq: Every day | ORAL | 0 refills | Status: DC

## 2017-08-16 MED ORDER — MELOXICAM 7.5 MG PO TABS: ORAL_TABLET | ORAL | 0 refills | Status: DC

## 2017-08-16 NOTE — Telephone Encounter (Signed)
Copied from [place] [id number]. Topic: Quick Communication - Rx Refill/Question >> Aug 16, 2017  2:49 PM Margot Ables wrote: Medication: out of Cymbalta and meloxicam - pt was out thru the weekend  Has the patient contacted their pharmacy? No  (Agent: If no, request that the patient contact the pharmacy for the refill.) Preferred Pharmacy (with phone number or street name): [facility] [address] [place] [address] [place] [address] Phone: [phone] Fax: [phone]

## 2017-08-16 NOTE — Telephone Encounter (Signed)
Daughter Dr. [name] Heck called in for her mother from [place].   Pt here in [place].   Last time with pt was Saturday and she was fine then.    Ms. Mabin called her daughter, Dr. [name] Heck and told her she has been vomiting and having some diarrhea that started this morning.    Dr. [name] Heck was not able to answer several of the triage questions since she was  Not with her daughter.   Dr. [name] Heck said I could talk with her mother when I asked if I could call Ms. Crisman myself.   She does have some mild dementia.    I called Ms. Shank and spoke with her.  She informed me she vomited twice this morning and had some diarrhea but that the diarrhea has stopped.    She c/o having some abd pain before she vomited this morning.    She does not have any appetite but she is drinking water.   I encouraged her to continue drinking water and fluids to prevent dehydration.    I instructed her to call her daughter back because Dr. [name] Heck wanted to know how she is doing after I  Talked with Ms. Gobert.   Ms. Sesay verbalized understanding and said she would drink some water, lay down on the couch and call her daughter back.  I returned a call to Dr. [name] Heck (daughter) after talking with Ms. Frick just to make sure Ms. Martian had spoken with her daughter and to make sure Dr. [name] Heck did not have any other questions.  No further questions.    Dr. [name] Heck mentioned that she just needs to get her mother back on her Cymbalta.   She ran out.   The agent has submitted the request for a refill.     Reason for Disposition . MILD vomiting with diarrhea  Answer Assessment - Initial Assessment Questions 1. VOMITING SEVERITY: "How many times have you vomited in the past 24 hours?"     - MILD:  1 - 2 times/day    - MODERATE: 3 - 5 times/day, decreased oral intake without significant weight loss or symptoms of dehydration    - SEVERE: 6 or more times/day, vomits everything or nearly everything, with significant weight loss,  symptoms of dehydration      Started vomiting this morning.   Vomited twice.   2. ONSET: "When did the vomiting begin?"      Started this morning 3. FLUIDS: "What fluids or food have you vomited up today?" "Have you been able to keep any fluids down?"     Saturday she was fine.   I'm not certain.   Does not sound like she is keeping anything down.   Daughter in [facility] Dr. [name] Heck. 4. ABDOMINAL PAIN: "Are your having any abdominal pain?" If yes : "How bad is it and what does it feel like?" (e.g., crampy, dull, intermittent, constant)     Yes having abd pain.    "I am still confused and don't feel right".  " I'm feeling so bad"    I'm drinking water.  I'm not [place]. 5. DIARRHEA: "Is there any diarrhea?" If so, ask: "How many times today?"      Had diarrhea this morning but not now.     I did have a headache earlier but I'm feeling ok now.    I'm just nervous.   I just don't know what is wrong with me.   I'm feeling fine now. 6. CONTACTS: "  Is there anyone else in the family with the same symptoms?"      No per her daughter Dr. [name] Heck 7. CAUSE: "What do you think is causing your vomiting?"     "I don't know"   Per daughter pt was fine on Saturday. 8. HYDRATION STATUS: "Any signs of dehydration?" (e.g., dry mouth [not only dry lips], too weak to stand) "When did you last urinate?"     Pt stated,   "She is drinking water".    9. OTHER SYMPTOMS: "Do you have any other symptoms?" (e.g., fever, headache, vertigo, vomiting blood or coffee grounds, recent head injury)     Had a headache this morning but it's better now. 10. PREGNANCY: "Is there any chance you are pregnant?" "When was your last menstrual period?"       Not asked due to age  Protocols used: [facility]

## 2017-08-19 ENCOUNTER — Other Ambulatory Visit (INDEPENDENT_AMBULATORY_CARE_PROVIDER_SITE_OTHER): Payer: Medicare Other

## 2017-08-19 ENCOUNTER — Encounter: Payer: Self-pay | Admitting: Internal Medicine

## 2017-08-19 ENCOUNTER — Ambulatory Visit (INDEPENDENT_AMBULATORY_CARE_PROVIDER_SITE_OTHER): Payer: Medicare Other | Admitting: Internal Medicine

## 2017-08-19 VITALS — BP 134/62 | HR 66 | Temp 98.3°F | Resp 16 | Wt 136.0 lb

## 2017-08-19 DIAGNOSIS — I10 Essential (primary) hypertension: Secondary | ICD-10-CM

## 2017-08-19 DIAGNOSIS — I25810 Atherosclerosis of coronary artery bypass graft(s) without angina pectoris: Secondary | ICD-10-CM | POA: Diagnosis not present

## 2017-08-19 DIAGNOSIS — E1140 Type 2 diabetes mellitus with diabetic neuropathy, unspecified: Secondary | ICD-10-CM | POA: Diagnosis not present

## 2017-08-19 DIAGNOSIS — K219 Gastro-esophageal reflux disease without esophagitis: Secondary | ICD-10-CM

## 2017-08-19 DIAGNOSIS — F0390 Unspecified dementia without behavioral disturbance: Secondary | ICD-10-CM | POA: Diagnosis not present

## 2017-08-19 DIAGNOSIS — G3184 Mild cognitive impairment, so stated: Secondary | ICD-10-CM | POA: Diagnosis not present

## 2017-08-19 DIAGNOSIS — F3289 Other specified depressive episodes: Secondary | ICD-10-CM

## 2017-08-19 DIAGNOSIS — E782 Mixed hyperlipidemia: Secondary | ICD-10-CM

## 2017-08-19 DIAGNOSIS — F03A0 Unspecified dementia, mild, without behavioral disturbance, psychotic disturbance, mood disturbance, and anxiety: Secondary | ICD-10-CM

## 2017-08-19 LAB — CBC WITH DIFFERENTIAL/PLATELET
Basophils Absolute: 0.1 10*3/uL (ref 0.0–0.1)
Basophils Relative: 1.9 % (ref 0.0–3.0)
Eosinophils Absolute: 0.1 10*3/uL (ref 0.0–0.7)
Eosinophils Relative: 1.2 % (ref 0.0–5.0)
HCT: 42.9 % (ref 36.0–46.0)
Hemoglobin: 14.1 g/dL (ref 12.0–15.0)
Lymphocytes Relative: 40.5 % (ref 12.0–46.0)
Lymphs Abs: 2.5 10*3/uL (ref 0.7–4.0)
MCHC: 32.8 g/dL (ref 30.0–36.0)
MCV: 96 fl (ref 78.0–100.0)
Monocytes Absolute: 0.6 10*3/uL (ref 0.1–1.0)
Monocytes Relative: 9.1 % (ref 3.0–12.0)
Neutro Abs: 2.9 10*3/uL (ref 1.4–7.7)
Neutrophils Relative %: 47.3 % (ref 43.0–77.0)
Platelets: 248 10*3/uL (ref 150.0–400.0)
RBC: 4.47 Mil/uL (ref 3.87–5.11)
RDW: 14.1 % (ref 11.5–15.5)
WBC: 6.1 10*3/uL (ref 4.0–10.5)

## 2017-08-19 LAB — COMPREHENSIVE METABOLIC PANEL
ALT: 21 U/L (ref 0–35)
AST: 20 U/L (ref 0–37)
Albumin: 3.9 g/dL (ref 3.5–5.2)
Alkaline Phosphatase: 64 U/L (ref 39–117)
BUN: 25 mg/dL — ABNORMAL HIGH (ref 6–23)
CO2: 31 mEq/L (ref 19–32)
Calcium: 9.4 mg/dL (ref 8.4–10.5)
Chloride: 98 mEq/L (ref 96–112)
Creatinine, Ser: 1.1 mg/dL (ref 0.40–1.20)
GFR: 60.68 mL/min (ref >60.00)
Glucose, Bld: 127 mg/dL — ABNORMAL HIGH (ref 70–99)
Potassium: 4.1 mEq/L (ref 3.5–5.1)
Sodium: 138 mEq/L (ref 135–145)
Total Bilirubin: 0.9 mg/dL (ref 0.2–1.2)
Total Protein: 6.7 g/dL (ref 6.0–8.3)

## 2017-08-19 LAB — TSH: TSH: 0.94 u[IU]/mL (ref 0.35–4.50)

## 2017-08-19 LAB — HEMOGLOBIN A1C: Hgb A1c MFr Bld: 5.9 % (ref 4.6–6.5)

## 2017-08-19 LAB — LIPID PANEL
Cholesterol: 193 mg/dL (ref 0–200)
HDL: 109.1 mg/dL (ref >39.00)
LDL Cholesterol: 64 mg/dL (ref 0–99)
NonHDL: 83.48
Total CHOL/HDL Ratio: 2
Triglycerides: 99 mg/dL (ref 0.0–149.0)
VLDL: 19.8 mg/dL (ref 0.0–40.0)

## 2017-08-19 NOTE — Assessment & Plan Note (Signed)
GERD controlled Continue daily medication  

## 2017-08-19 NOTE — Assessment & Plan Note (Signed)
Check lipid panel, CMP, TSH Continue statin 

## 2017-08-19 NOTE — Patient Instructions (Signed)
  Test(s) ordered today. Your results will be released to MyChart (or called to you) after review, usually within 72hours after test completion. If any changes need to be made, you will be notified at that same time.  Medications reviewed and updated.  No changes recommended at this time.    Please followup in 6 months   

## 2017-08-19 NOTE — Assessment & Plan Note (Signed)
BP well controlled Current regimen effective and well tolerated Continue current medications at current doses cmp, CBC, TSH

## 2017-08-19 NOTE — Assessment & Plan Note (Signed)
Taking Aricept, just recently started on Namenda No side effects Management per neurology

## 2017-08-19 NOTE — Assessment & Plan Note (Signed)
Diet controlled Check A1c today Not able to exercise regularly due to chronic hip bursitis Compliant with diabetic diet

## 2017-09-02 ENCOUNTER — Other Ambulatory Visit: Payer: Self-pay | Admitting: Internal Medicine

## 2017-09-06 ENCOUNTER — Other Ambulatory Visit: Payer: Self-pay | Admitting: Internal Medicine

## 2017-09-10 ENCOUNTER — Other Ambulatory Visit: Payer: Self-pay | Admitting: Internal Medicine

## 2017-09-10 DIAGNOSIS — I25810 Atherosclerosis of coronary artery bypass graft(s) without angina pectoris: Secondary | ICD-10-CM

## 2017-09-10 DIAGNOSIS — E785 Hyperlipidemia, unspecified: Secondary | ICD-10-CM

## 2017-09-10 MED ORDER — ATORVASTATIN CALCIUM 40 MG PO TABS: 40.0000 mg | ORAL_TABLET | Freq: Every day | ORAL | 3 refills | Status: DC

## 2017-09-10 NOTE — Telephone Encounter (Signed)
Pt's medication was resent to the correct pharmacy as requested. Confirmation received.  ?

## 2017-09-13 ENCOUNTER — Telehealth: Payer: Self-pay | Admitting: Internal Medicine

## 2017-09-14 ENCOUNTER — Other Ambulatory Visit: Payer: Self-pay | Admitting: *Deleted

## 2017-09-14 MED ORDER — MELOXICAM 7.5 MG PO TABS: 7.5000 mg | ORAL_TABLET | Freq: Every day | ORAL | 0 refills | Status: DC

## 2017-09-14 MED ORDER — DULOXETINE HCL 30 MG PO CPEP: 30.0000 mg | ORAL_CAPSULE | Freq: Every day | ORAL | 0 refills | Status: DC

## 2017-09-14 NOTE — Telephone Encounter (Signed)
Patient daughter called [facility] called and said that patient would like these two medication refill at the pharmacy due to she is completely out and then after that they will request from her mail order. Still trying to set-up pt mail order.

## 2017-09-14 NOTE — Telephone Encounter (Signed)
Call to patient- she needs extension of Rx until she can get mail order set up- her daughter will pick up for her. Rx for 1 month supply sent to local pharmacy sent for patient.

## 2017-10-07 DIAGNOSIS — M47817 Spondylosis without myelopathy or radiculopathy, lumbosacral region: Secondary | ICD-10-CM | POA: Diagnosis not present

## 2017-10-07 DIAGNOSIS — M4316 Spondylolisthesis, lumbar region: Secondary | ICD-10-CM | POA: Diagnosis not present

## 2017-10-26 DIAGNOSIS — M25511 Pain in right shoulder: Secondary | ICD-10-CM | POA: Diagnosis not present

## 2017-10-26 DIAGNOSIS — M542 Cervicalgia: Secondary | ICD-10-CM | POA: Diagnosis not present

## 2017-10-27 ENCOUNTER — Other Ambulatory Visit: Payer: Self-pay | Admitting: Physical Medicine and Rehabilitation

## 2017-10-27 DIAGNOSIS — G8929 Other chronic pain: Secondary | ICD-10-CM

## 2017-10-27 DIAGNOSIS — M542 Cervicalgia: Secondary | ICD-10-CM

## 2017-10-27 DIAGNOSIS — M79601 Pain in right arm: Secondary | ICD-10-CM

## 2017-11-04 ENCOUNTER — Ambulatory Visit
Admission: RE | Admit: 2017-11-04 | Discharge: 2017-11-04 | Disposition: A | Discharge status: Home / Self Care | Payer: Medicare Other | Source: Ambulatory Visit | Attending: Physical Medicine and Rehabilitation | Admitting: Physical Medicine and Rehabilitation

## 2017-11-04 DIAGNOSIS — G8929 Other chronic pain: Secondary | ICD-10-CM

## 2017-11-04 DIAGNOSIS — M4802 Spinal stenosis, cervical region: Secondary | ICD-10-CM | POA: Diagnosis not present

## 2017-11-04 DIAGNOSIS — M542 Cervicalgia: Secondary | ICD-10-CM

## 2017-11-04 DIAGNOSIS — M79601 Pain in right arm: Secondary | ICD-10-CM

## 2017-11-11 ENCOUNTER — Ambulatory Visit: Payer: Medicare Other | Admitting: Family Medicine

## 2017-11-18 DIAGNOSIS — M47812 Spondylosis without myelopathy or radiculopathy, cervical region: Secondary | ICD-10-CM | POA: Diagnosis not present

## 2017-11-18 DIAGNOSIS — M542 Cervicalgia: Secondary | ICD-10-CM | POA: Diagnosis not present

## 2017-11-18 DIAGNOSIS — M5412 Radiculopathy, cervical region: Secondary | ICD-10-CM | POA: Diagnosis not present

## 2017-11-25 DIAGNOSIS — M4802 Spinal stenosis, cervical region: Secondary | ICD-10-CM | POA: Diagnosis not present

## 2017-11-25 DIAGNOSIS — M2560 Stiffness of unspecified joint, not elsewhere classified: Secondary | ICD-10-CM | POA: Diagnosis not present

## 2017-11-25 DIAGNOSIS — M4312 Spondylolisthesis, cervical region: Secondary | ICD-10-CM | POA: Diagnosis not present

## 2017-11-25 DIAGNOSIS — M542 Cervicalgia: Secondary | ICD-10-CM | POA: Diagnosis not present

## 2017-11-28 DIAGNOSIS — M79675 Pain in left toe(s): Secondary | ICD-10-CM | POA: Diagnosis not present

## 2017-11-28 DIAGNOSIS — R413 Other amnesia: Secondary | ICD-10-CM | POA: Diagnosis not present

## 2017-11-29 DIAGNOSIS — M4802 Spinal stenosis, cervical region: Secondary | ICD-10-CM | POA: Diagnosis not present

## 2017-11-29 DIAGNOSIS — M542 Cervicalgia: Secondary | ICD-10-CM | POA: Diagnosis not present

## 2017-11-29 DIAGNOSIS — M4312 Spondylolisthesis, cervical region: Secondary | ICD-10-CM | POA: Diagnosis not present

## 2017-11-29 DIAGNOSIS — M2560 Stiffness of unspecified joint, not elsewhere classified: Secondary | ICD-10-CM | POA: Diagnosis not present

## 2017-12-01 NOTE — Progress Notes (Signed)
Subjective:    Patient ID: [name], female    DOB: July 27, 1930, 84 y.o.   MRN: [id number]  HPI The patient is here for follow up from urgent care.   Last Thursday or Friday she was at home and she stepped on a toothpick between her first and second toes on her right foot.  It did hurt and was slightly swollen.  A couple of days later the area was more swollen, red and very painful.  She went to urgent care.  They did not see any foreign body.  She does have a history of pseudogout, but they were also concerned about cellulitis.  She was given an injection for an anti-inflammatory, most likely Toradol, and started on doxycycline, which she has been taking as prescribed.  The foot looked much better later that day-when her daughter went over to look at it it appeared normal.  Since then it has progressively gotten worse.  She states it is very painful.  It is still red and swollen and it does feel warm to touch.  She denies any fevers or chills.  She does have some numbness and tingling in the toes.  It is painful to wiggle her toes.  Medications and allergies reviewed with patient and updated if appropriate.  Patient Active Problem List   Diagnosis Date Noted  . Mild dementia 07/27/2017  . Buttock pain 10/30/2016  . Chronic RUQ pain 05/28/2016  . Right knee pain 06/14/2015  . Diabetic neuropathy ([place]) 03/21/2014  . Bunion 03/21/2014  . PVD (pulmonary valve disease) 01/23/2014  . Onychomycosis 12/20/2013  . PVD (peripheral vascular disease) ([place]) 12/19/2013  . Gout 12/13/2013  . Depression   . Spinal stenosis of lumbar region 04/24/2013  . Arthritis of shoulder region, left 09/11/2011  . Type 2 diabetes mellitus ([place]) 01/17/2009  . Anemia 10/15/2008  . CAD, NATIVE VESSEL 06/09/2008  . Hyperlipidemia 06/15/2007  . Essential hypertension 06/15/2007  . GERD 06/15/2007  . Fibromyalgia syndrome 06/15/2007  . 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 DIVERTICULOSIS, COLON  03/02/2007    Current Outpatient Medications on File Prior to Visit  Medication Sig Dispense Refill  . amLODipine (NORVASC) 5 MG tablet Take 1 tablet (5 mg total) by mouth 2 (two) times daily. 180 tablet 3  . aspirin EC 81 MG tablet Take 81 mg by mouth daily.    [place] Kitchen atorvastatin (LIPITOR) 40 MG tablet Take 1 tablet (40 mg total) by mouth daily. 90 tablet 3  . carvedilol (COREG) 6.25 MG tablet TAKE ONE TABLET BY MOUTH TWICE A DAY WITH A MEAL 180 tablet 3  . cetirizine (ZYRTEC) 10 MG tablet TAKE ONE TABLET BY MOUTH DAILY 90 tablet 3  . donepezil (ARICEPT) 10 MG tablet Take 1 tablet daily 90 tablet 3  . DULoxetine (CYMBALTA) 30 MG capsule Take 1 capsule (30 mg total) by mouth daily. 30 capsule 0  . gabapentin (NEURONTIN) 300 MG capsule TAKE ONE CAPSULE BY MOUTH TWICE DAILY. (Patient taking differently: Take 300 mg by mouth at bedtime. ) 60 capsule 5  . hydrochlorothiazide (HYDRODIURIL) 12.5 MG tablet Take 1 tablet (12.5 mg total) by mouth daily. 90 tablet 3  . meloxicam (MOBIC) 7.5 MG tablet Take 1 tablet (7.5 mg total) by mouth daily. 30 tablet 0  . memantine (NAMENDA) 10 MG tablet Take 1 tablet (10 mg total) by mouth 2 (two) times daily. 180 tablet 3  . omeprazole (PRILOSEC) 40 MG capsule TAKE 1 CAPSULE (40 MG TOTAL) BY MOUTH  DAILY ON AN EMPTY STOMACH 90 capsule 3  . ramipril (ALTACE) 10 MG capsule Take 1 capsule (10 mg total) by mouth 2 (two) times daily. 180 capsule 3   No current facility-administered medications on file prior to visit.     Past Medical History:  Diagnosis Date  . CAD, NATIVE VESSEL 06/09/2008  . DIABETES MELLITUS, TYPE II   . FIBROMYALGIA   . GERD   . Gout   . HIATAL HERNIA   . HYPERLIPIDEMIA   . HYPERTENSION   . Insomnia   . OA (osteoarthritis)    severe, shoulders, hands - inflammatory, ?RA  . S/P CABG x 1   . S/P inguinal hernia repair   . TUBULOVILLOUS ADENOMA, 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   colo 04/2012 - no polyps - no further screening planned (age >29)  .  UNSPECIFIED ANEMIA     Past Surgical History:  Procedure Laterality Date  . ABDOMINAL HYSTERECTOMY  1981  . APPENDECTOMY    . CATARACT EXTRACTION, BILATERAL    . CORONARY ARTERY BYPASS GRAFT  2010  . HERNIA REPAIR     INGUINAL  . JOINT REPLACEMENT    . OVARIAN CYST REMOVAL    . REPLACEMENT TOTAL KNEE BILATERAL Bilateral    Rt-1974, Lt -1989  . REVERSE SHOULDER ARTHROPLASTY  09/08/2011   Procedure: REVERSE SHOULDER ARTHROPLASTY;  Surgeon: Nita Sells, MD;  Location: [facility];  Service: Orthopedics;  Laterality: Left;  left reverse total shoulder arthroplasty  . ROTATOR CUFF REPAIR     Left    Social History   Socioeconomic History  . Marital status: Widowed    Spouse name: Not on file  . Number of children: Not on file  . Years of education: Not on file  . Highest education level: Not on file  Occupational History  . Not on file  Social Needs  . Financial resource strain: Not on file  . Food insecurity:    Worry: Not on file    Inability: Not on file  . Transportation needs:    Medical: Not on file    Non-medical: Not on file  Tobacco Use  . Smoking status: Former Smoker    Last attempt to quit: 04/26/1970    Years since quitting: 47.6  . Smokeless tobacco: Never Used  Substance and Sexual Activity  . Alcohol use: Yes    Alcohol/week: 1.0 standard drinks    Types: 1 Glasses of wine per week  . Drug use: No  . Sexual activity: Not on file  Lifestyle  . Physical activity:    Days per week: Not on file    Minutes per session: Not on file  . Stress: Not on file  Relationships  . Social connections:    Talks on phone: Not on file    Gets together: Not on file    Attends religious service: Not on file    Active member of club or organization: Not on file    Attends meetings of clubs or organizations: Not on file    Relationship status: Not on file  Other Topics Concern  . Not on file  Social History Narrative   Widows, lives alone. 2 sons. 2 daughters.  Retired Ambulance person    Family History  Problem Relation Age of Onset  . Cancer Mother        Stomach  . Stomach cancer Mother 75  . Hypertension Mother   . Heart disease Father 32       MI  .  Diabetes Father   . Heart attack Father   . Diabetes Daughter   . Hypertension Daughter   . Vascular Disease Sister   . Stroke Sister   . Dementia Sister   . Diabetes Sister   . Parkinsonism Sister   . Stroke Sister   . Heart disease Sister   . Heart attack Brother   . Anesthesia problems Neg Hx   . Colon cancer Neg Hx     Review of Systems  Constitutional: Negative for chills and fever.  Cardiovascular: Negative for leg swelling (No ankle swelling in right lower extremity).  Skin: Positive for color change. Negative for wound.  Neurological: Positive for numbness.       Objective:   Vitals:   12/02/17 1015  BP: 120/64  Pulse: 60  Resp: 16  Temp: 97.8 F (36.6 C)  SpO2: 97%   BP Readings from Last 3 Encounters:  12/02/17 120/64  08/19/17 134/62  07/14/17 134/60   Wt Readings from Last 3 Encounters:  08/19/17 136 lb (61.7 kg)  07/14/17 137 lb (62.1 kg)  05/13/17 132 lb (59.9 kg)   There is no height or weight on file to calculate BMI.   Physical Exam  Constitutional: She appears well-developed and well-nourished. No distress.  HENT:  Head: Normocephalic and atraumatic.  Musculoskeletal: She exhibits no edema (Bilateral lower extremities).  Right foot with swelling and erythema first and second toes and distal foot near the MTP joints of those toes.  No wounds between first and second toes, but there is what appears to be a splinter that is very tender to touch on the plantar surface of her first toe at her MTP joint.  Warm to touch, very tender with palpation and movement of the toes.  No obvious fluctuation, but concern for foreign body and abscess  Skin: Skin is warm and dry. She is not diaphoretic.           Assessment & Plan:    30 minutes were spent  face-to-face with the patient, over 50% of which was spent coordinating care for the patient and getting her into podiatry.     See Problem List for Assessment and Plan of chronic medical problems.

## 2017-12-02 ENCOUNTER — Encounter: Payer: Self-pay | Admitting: Internal Medicine

## 2017-12-02 ENCOUNTER — Ambulatory Visit (INDEPENDENT_AMBULATORY_CARE_PROVIDER_SITE_OTHER): Payer: Medicare Other | Admitting: Internal Medicine

## 2017-12-02 VITALS — BP 120/64 | HR 60 | Temp 97.8°F | Resp 16

## 2017-12-02 DIAGNOSIS — L02611 Cutaneous abscess of right foot: Secondary | ICD-10-CM | POA: Insufficient documentation

## 2017-12-02 DIAGNOSIS — I25810 Atherosclerosis of coronary artery bypass graft(s) without angina pectoris: Secondary | ICD-10-CM | POA: Diagnosis not present

## 2017-12-02 DIAGNOSIS — M79672 Pain in left foot: Secondary | ICD-10-CM | POA: Diagnosis not present

## 2017-12-02 DIAGNOSIS — Z01818 Encounter for other preprocedural examination: Secondary | ICD-10-CM | POA: Diagnosis not present

## 2017-12-02 DIAGNOSIS — L03116 Cellulitis of left lower limb: Secondary | ICD-10-CM | POA: Diagnosis not present

## 2017-12-02 DIAGNOSIS — G8929 Other chronic pain: Secondary | ICD-10-CM | POA: Diagnosis not present

## 2017-12-02 NOTE — Patient Instructions (Signed)
Referral for podiatry.

## 2017-12-02 NOTE — Assessment & Plan Note (Signed)
She likely has a foreign body with abscess and surrounding cellulitis of the right foot Oral doxycycline has not been effective-symptoms are worsening Needs possible drainage-we were able to get her in to see podiatry today and hopefully she can avoid the emergency room Further evaluation and treatment per podiatry

## 2017-12-05 ENCOUNTER — Encounter (HOSPITAL_COMMUNITY): Payer: Self-pay

## 2017-12-05 ENCOUNTER — Inpatient Hospital Stay (HOSPITAL_COMMUNITY): Payer: Medicare Other

## 2017-12-05 ENCOUNTER — Inpatient Hospital Stay (HOSPITAL_COMMUNITY)
Admission: EM | Admit: 2017-12-05 | Discharge: 2017-12-09 | DRG: 580 | Disposition: A | Discharge status: Home Health | Payer: Medicare Other | Attending: Internal Medicine | Admitting: Internal Medicine

## 2017-12-05 DIAGNOSIS — E1151 Type 2 diabetes mellitus with diabetic peripheral angiopathy without gangrene: Secondary | ICD-10-CM | POA: Diagnosis not present

## 2017-12-05 DIAGNOSIS — I10 Essential (primary) hypertension: Secondary | ICD-10-CM | POA: Diagnosis present

## 2017-12-05 DIAGNOSIS — L03119 Cellulitis of unspecified part of limb: Secondary | ICD-10-CM | POA: Diagnosis present

## 2017-12-05 DIAGNOSIS — Z79899 Other long term (current) drug therapy: Secondary | ICD-10-CM

## 2017-12-05 DIAGNOSIS — Z90710 Acquired absence of both cervix and uterus: Secondary | ICD-10-CM | POA: Diagnosis not present

## 2017-12-05 DIAGNOSIS — Z951 Presence of aortocoronary bypass graft: Secondary | ICD-10-CM | POA: Diagnosis not present

## 2017-12-05 DIAGNOSIS — F0390 Unspecified dementia without behavioral disturbance: Secondary | ICD-10-CM | POA: Diagnosis not present

## 2017-12-05 DIAGNOSIS — E785 Hyperlipidemia, unspecified: Secondary | ICD-10-CM | POA: Diagnosis not present

## 2017-12-05 DIAGNOSIS — W458XXA Other foreign body or object entering through skin, initial encounter: Secondary | ICD-10-CM | POA: Diagnosis present

## 2017-12-05 DIAGNOSIS — M7989 Other specified soft tissue disorders: Secondary | ICD-10-CM | POA: Diagnosis not present

## 2017-12-05 DIAGNOSIS — I2510 Atherosclerotic heart disease of native coronary artery without angina pectoris: Secondary | ICD-10-CM | POA: Diagnosis present

## 2017-12-05 DIAGNOSIS — F329 Major depressive disorder, single episode, unspecified: Secondary | ICD-10-CM | POA: Diagnosis present

## 2017-12-05 DIAGNOSIS — Z800 Family history of malignant neoplasm of digestive organs: Secondary | ICD-10-CM

## 2017-12-05 DIAGNOSIS — Z833 Family history of diabetes mellitus: Secondary | ICD-10-CM

## 2017-12-05 DIAGNOSIS — Z87891 Personal history of nicotine dependence: Secondary | ICD-10-CM | POA: Diagnosis not present

## 2017-12-05 DIAGNOSIS — M797 Fibromyalgia: Secondary | ICD-10-CM | POA: Diagnosis present

## 2017-12-05 DIAGNOSIS — Z96653 Presence of artificial knee joint, bilateral: Secondary | ICD-10-CM | POA: Diagnosis present

## 2017-12-05 DIAGNOSIS — Z86010 Personal history of colonic polyps: Secondary | ICD-10-CM | POA: Diagnosis not present

## 2017-12-05 DIAGNOSIS — M19042 Primary osteoarthritis, left hand: Secondary | ICD-10-CM | POA: Diagnosis present

## 2017-12-05 DIAGNOSIS — Z9842 Cataract extraction status, left eye: Secondary | ICD-10-CM | POA: Diagnosis not present

## 2017-12-05 DIAGNOSIS — E1142 Type 2 diabetes mellitus with diabetic polyneuropathy: Secondary | ICD-10-CM | POA: Diagnosis present

## 2017-12-05 DIAGNOSIS — Z96612 Presence of left artificial shoulder joint: Secondary | ICD-10-CM | POA: Diagnosis present

## 2017-12-05 DIAGNOSIS — Z9841 Cataract extraction status, right eye: Secondary | ICD-10-CM | POA: Diagnosis not present

## 2017-12-05 DIAGNOSIS — Y92009 Unspecified place in unspecified non-institutional (private) residence as the place of occurrence of the external cause: Secondary | ICD-10-CM

## 2017-12-05 DIAGNOSIS — M48061 Spinal stenosis, lumbar region without neurogenic claudication: Secondary | ICD-10-CM | POA: Diagnosis present

## 2017-12-05 DIAGNOSIS — M19041 Primary osteoarthritis, right hand: Secondary | ICD-10-CM | POA: Diagnosis present

## 2017-12-05 DIAGNOSIS — S90852A Superficial foreign body, left foot, initial encounter: Secondary | ICD-10-CM | POA: Diagnosis not present

## 2017-12-05 DIAGNOSIS — Z823 Family history of stroke: Secondary | ICD-10-CM

## 2017-12-05 DIAGNOSIS — L02612 Cutaneous abscess of left foot: Secondary | ICD-10-CM | POA: Diagnosis present

## 2017-12-05 DIAGNOSIS — B9561 Methicillin susceptible Staphylococcus aureus infection as the cause of diseases classified elsewhere: Secondary | ICD-10-CM | POA: Diagnosis not present

## 2017-12-05 DIAGNOSIS — Z791 Long term (current) use of non-steroidal anti-inflammatories (NSAID): Secondary | ICD-10-CM

## 2017-12-05 DIAGNOSIS — K219 Gastro-esophageal reflux disease without esophagitis: Secondary | ICD-10-CM | POA: Diagnosis not present

## 2017-12-05 DIAGNOSIS — Z820 Family history of epilepsy and other diseases of the nervous system: Secondary | ICD-10-CM

## 2017-12-05 DIAGNOSIS — Z79891 Long term (current) use of opiate analgesic: Secondary | ICD-10-CM

## 2017-12-05 DIAGNOSIS — Z818 Family history of other mental and behavioral disorders: Secondary | ICD-10-CM

## 2017-12-05 DIAGNOSIS — Z7982 Long term (current) use of aspirin: Secondary | ICD-10-CM

## 2017-12-05 DIAGNOSIS — L03116 Cellulitis of left lower limb: Secondary | ICD-10-CM | POA: Diagnosis not present

## 2017-12-05 DIAGNOSIS — M79672 Pain in left foot: Secondary | ICD-10-CM | POA: Diagnosis not present

## 2017-12-05 DIAGNOSIS — L02619 Cutaneous abscess of unspecified foot: Secondary | ICD-10-CM | POA: Diagnosis not present

## 2017-12-05 DIAGNOSIS — S91342A Puncture wound with foreign body, left foot, initial encounter: Secondary | ICD-10-CM | POA: Diagnosis not present

## 2017-12-05 DIAGNOSIS — Z8249 Family history of ischemic heart disease and other diseases of the circulatory system: Secondary | ICD-10-CM

## 2017-12-05 LAB — GLUCOSE, CAPILLARY: Glucose-Capillary: 160 mg/dL — ABNORMAL HIGH (ref 70–99)

## 2017-12-05 LAB — BASIC METABOLIC PANEL
Anion gap: 8 (ref 5–15)
BUN: 15 mg/dL (ref 8–23)
CO2: 25 mmol/L (ref 22–32)
Calcium: 9.1 mg/dL (ref 8.9–10.3)
Chloride: 105 mmol/L (ref 98–111)
Creatinine, Ser: 0.92 mg/dL (ref 0.44–1.00)
GFR calc Af Amer: >60 mL/min (ref >60)
GFR calc non Af Amer: 55 mL/min — ABNORMAL LOW (ref >60)
Glucose, Bld: 125 mg/dL — ABNORMAL HIGH (ref 70–99)
Potassium: 4.7 mmol/L (ref 3.5–5.1)
Sodium: 138 mmol/L (ref 135–145)

## 2017-12-05 LAB — CBC
HCT: 37.2 % (ref 36.0–46.0)
Hemoglobin: 11.6 g/dL — ABNORMAL LOW (ref 12.0–15.0)
MCH: 30.5 pg (ref 26.0–34.0)
MCHC: 31.2 g/dL (ref 30.0–36.0)
MCV: 97.9 fL (ref 78.0–100.0)
Platelets: 229 10*3/uL (ref 150–400)
RBC: 3.8 MIL/uL — ABNORMAL LOW (ref 3.87–5.11)
RDW: 12.1 % (ref 11.5–15.5)
WBC: 7.1 10*3/uL (ref 4.0–10.5)

## 2017-12-05 MED ORDER — ACETAMINOPHEN 325 MG PO TABS
650.0000 mg | ORAL_TABLET | Freq: Four times a day (QID) | ORAL | Status: DC | PRN
  Administered 2017-12-08 – 2017-12-09 (×2): 650 mg via ORAL
  Filled 2017-12-05 (×2): qty 2

## 2017-12-05 MED ORDER — SODIUM CHLORIDE 0.9 % IV SOLN
INTRAVENOUS | Status: DC | PRN
  Administered 2017-12-05: 1000 mL via INTRAVENOUS

## 2017-12-05 MED ORDER — CLINDAMYCIN PHOSPHATE 900 MG/50ML IV SOLN
900.0000 mg | Freq: Once | INTRAVENOUS | Status: AC
  Administered 2017-12-05: 900 mg via INTRAVENOUS
  Filled 2017-12-05: qty 50

## 2017-12-05 MED ORDER — ENOXAPARIN SODIUM 40 MG/0.4ML ~~LOC~~ SOLN
40.0000 mg | SUBCUTANEOUS | Status: DC
  Filled 2017-12-05: qty 0.4

## 2017-12-05 MED ORDER — SENNOSIDES-DOCUSATE SODIUM 8.6-50 MG PO TABS: 1.0000 | ORAL_TABLET | Freq: Every evening | ORAL | Status: DC | PRN

## 2017-12-05 MED ORDER — DULOXETINE HCL 30 MG PO CPEP
30.0000 mg | ORAL_CAPSULE | Freq: Every day | ORAL | Status: DC
  Administered 2017-12-06 – 2017-12-09 (×4): 30 mg via ORAL
  Filled 2017-12-05 (×5): qty 1

## 2017-12-05 MED ORDER — ONDANSETRON HCL 4 MG/2ML IJ SOLN: 4.0000 mg | Freq: Four times a day (QID) | INTRAMUSCULAR | Status: DC | PRN

## 2017-12-05 MED ORDER — SODIUM CHLORIDE 0.9 % IV SOLN
INTRAVENOUS | Status: DC
  Administered 2017-12-05: 20:00:00 via INTRAVENOUS

## 2017-12-05 MED ORDER — MEMANTINE HCL 10 MG PO TABS
10.0000 mg | ORAL_TABLET | Freq: Two times a day (BID) | ORAL | Status: DC
  Administered 2017-12-05 – 2017-12-09 (×8): 10 mg via ORAL
  Filled 2017-12-05 (×9): qty 1

## 2017-12-05 MED ORDER — ATORVASTATIN CALCIUM 40 MG PO TABS
40.0000 mg | ORAL_TABLET | Freq: Every day | ORAL | Status: DC
  Administered 2017-12-06 – 2017-12-09 (×3): 40 mg via ORAL
  Filled 2017-12-05 (×5): qty 1

## 2017-12-05 MED ORDER — CARVEDILOL 6.25 MG PO TABS
6.2500 mg | ORAL_TABLET | Freq: Two times a day (BID) | ORAL | Status: DC
  Administered 2017-12-05 – 2017-12-09 (×8): 6.25 mg via ORAL
  Filled 2017-12-05 (×8): qty 1

## 2017-12-05 MED ORDER — CLINDAMYCIN PHOSPHATE 600 MG/50ML IV SOLN
600.0000 mg | Freq: Three times a day (TID) | INTRAVENOUS | Status: DC
Start: 2017-12-06 — End: 2017-12-07
  Administered 2017-12-06 – 2017-12-07 (×4): 600 mg via INTRAVENOUS
  Filled 2017-12-05 (×7): qty 50

## 2017-12-05 MED ORDER — RAMIPRIL 10 MG PO CAPS
10.0000 mg | ORAL_CAPSULE | Freq: Two times a day (BID) | ORAL | Status: DC
  Administered 2017-12-05 – 2017-12-09 (×8): 10 mg via ORAL
  Filled 2017-12-05 (×8): qty 1

## 2017-12-05 MED ORDER — AMLODIPINE BESYLATE 5 MG PO TABS
5.0000 mg | ORAL_TABLET | Freq: Two times a day (BID) | ORAL | Status: DC
  Administered 2017-12-05 – 2017-12-09 (×8): 5 mg via ORAL
  Filled 2017-12-05 (×9): qty 1

## 2017-12-05 MED ORDER — HYDROCODONE-ACETAMINOPHEN 5-325 MG PO TABS
1.0000 | ORAL_TABLET | ORAL | Status: DC | PRN
  Administered 2017-12-06 – 2017-12-08 (×6): 1 via ORAL
  Administered 2017-12-08: 2 via ORAL
  Administered 2017-12-09 (×2): 1 via ORAL
  Filled 2017-12-05 (×3): qty 1
  Filled 2017-12-05: qty 2
  Filled 2017-12-05 (×5): qty 1

## 2017-12-05 MED ORDER — ONDANSETRON HCL 4 MG PO TABS: 4.0000 mg | ORAL_TABLET | Freq: Four times a day (QID) | ORAL | Status: DC | PRN

## 2017-12-05 MED ORDER — TRAMADOL HCL 50 MG PO TABS
50.0000 mg | ORAL_TABLET | Freq: Three times a day (TID) | ORAL | Status: DC | PRN
  Administered 2017-12-05 – 2017-12-07 (×3): 50 mg via ORAL
  Filled 2017-12-05 (×4): qty 1

## 2017-12-05 MED ORDER — MELOXICAM 7.5 MG PO TABS
7.5000 mg | ORAL_TABLET | Freq: Every day | ORAL | Status: DC
  Administered 2017-12-06 – 2017-12-09 (×4): 7.5 mg via ORAL
  Filled 2017-12-05 (×5): qty 1

## 2017-12-05 MED ORDER — INSULIN ASPART 100 UNIT/ML ~~LOC~~ SOLN
0.0000 [IU] | Freq: Three times a day (TID) | SUBCUTANEOUS | Status: DC
  Administered 2017-12-06: 1 [IU] via SUBCUTANEOUS
  Administered 2017-12-07: 3 [IU] via SUBCUTANEOUS
  Administered 2017-12-08: 2 [IU] via SUBCUTANEOUS
  Administered 2017-12-08: 3 [IU] via SUBCUTANEOUS
  Administered 2017-12-09: 1 [IU] via SUBCUTANEOUS

## 2017-12-05 MED ORDER — ACETAMINOPHEN 325 MG PO TABS
650.0000 mg | ORAL_TABLET | Freq: Once | ORAL | Status: AC
  Administered 2017-12-05: 650 mg via ORAL
  Filled 2017-12-05: qty 2

## 2017-12-05 MED ORDER — BISACODYL 5 MG PO TBEC: 5.0000 mg | DELAYED_RELEASE_TABLET | Freq: Every day | ORAL | Status: DC | PRN

## 2017-12-05 MED ORDER — HYDROCHLOROTHIAZIDE 25 MG PO TABS
12.5000 mg | ORAL_TABLET | Freq: Every day | ORAL | Status: DC
  Administered 2017-12-06 – 2017-12-09 (×4): 12.5 mg via ORAL
  Filled 2017-12-05 (×5): qty 1

## 2017-12-05 MED ORDER — GABAPENTIN 300 MG PO CAPS
300.0000 mg | ORAL_CAPSULE | Freq: Every day | ORAL | Status: DC
  Administered 2017-12-05 – 2017-12-08 (×4): 300 mg via ORAL
  Filled 2017-12-05 (×4): qty 1

## 2017-12-05 MED ORDER — ACETAMINOPHEN 650 MG RE SUPP: 650.0000 mg | Freq: Four times a day (QID) | RECTAL | Status: DC | PRN

## 2017-12-05 MED ORDER — PANTOPRAZOLE SODIUM 40 MG PO TBEC
40.0000 mg | DELAYED_RELEASE_TABLET | Freq: Every day | ORAL | Status: DC
  Administered 2017-12-06 – 2017-12-09 (×4): 40 mg via ORAL
  Filled 2017-12-05 (×5): qty 1

## 2017-12-05 MED ORDER — LIDOCAINE HCL (PF) 1 % IJ SOLN
5.0000 mL | Freq: Once | INTRAMUSCULAR | Status: AC
  Administered 2017-12-05: 5 mL
  Filled 2017-12-05: qty 5

## 2017-12-05 MED ORDER — ASPIRIN EC 81 MG PO TBEC
81.0000 mg | DELAYED_RELEASE_TABLET | Freq: Every day | ORAL | Status: DC
  Administered 2017-12-06 – 2017-12-09 (×3): 81 mg via ORAL
  Filled 2017-12-05 (×4): qty 1

## 2017-12-05 MED ORDER — DONEPEZIL HCL 10 MG PO TABS
10.0000 mg | ORAL_TABLET | Freq: Every day | ORAL | Status: DC
  Administered 2017-12-06 – 2017-12-09 (×4): 10 mg via ORAL
  Filled 2017-12-05 (×5): qty 1

## 2017-12-05 NOTE — ED Provider Notes (Addendum)
[facility] EMERGENCY DEPARTMENT Provider Note   CSN: [id number] Arrival date & time: 12/05/17  1313     History   Chief Complaint Chief Complaint  Patient presents with  . Cellulitis    HPI [name] is a 84 y.o. female.  Patient is an 84 year old female with past medical history of diet-controlled diabetes and peripheral vascular disease presenting for left foot cellulitis.  Patient states that 1 week and 3 days ago she stepped on a toothpick and went to urgent care.  At that time patient was started on doxycycline and told to follow-up with PCP.  PCP then referred patient to podiatry.  Podiatrist office x-ray obtained showed foreign body patient was placed on Augmentin in preparation for surgery scheduled for 8/19.  This was scheduled by Dr. Fritzi Mandes, podiatry, at [facility]. This morning patient woke up and saw a "pus pocket" between the first and second toe and foot became erythematous.  Area of foot is swollen but per daughter this is remains swollen and no change in edema.  Patient has continued Augmentin and is compliant on medications.  Patient has been able to walk and bear weight on that foot.  Denies any fevers or chills.  Denies any nausea, vomiting, diarrhea.  Patient is unsure of last tetanus vaccine but daughter does not think it is up-to-date.     Past Medical History:  Diagnosis Date  . CAD, NATIVE VESSEL 06/09/2008  . DIABETES MELLITUS, TYPE II   . FIBROMYALGIA   . GERD   . Gout   . HIATAL HERNIA   . HYPERLIPIDEMIA   . HYPERTENSION   . Insomnia   . OA (osteoarthritis)    severe, shoulders, hands - inflammatory, ?RA  . S/P CABG x 1   . S/P inguinal hernia repair   . TUBULOVILLOUS ADENOMA, 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   colo 04/2012 - no polyps - no further screening planned (age >67)  . UNSPECIFIED ANEMIA     Patient Active Problem List   Diagnosis Date Noted  . Cellulitis of foot 12/05/2017  . Abscess of right foot 12/02/2017    . Mild dementia 07/27/2017  . Buttock pain 10/30/2016  . Chronic RUQ pain 05/28/2016  . Right knee pain 06/14/2015  . Diabetic neuropathy ([place]) 03/21/2014  . Bunion 03/21/2014  . PVD (pulmonary valve disease) 01/23/2014  . Onychomycosis 12/20/2013  . PVD (peripheral vascular disease) ([place]) 12/19/2013  . Gout 12/13/2013  . Depression   . Spinal stenosis of lumbar region 04/24/2013  . Arthritis of shoulder region, left 09/11/2011  . Type 2 diabetes mellitus ([place]) 01/17/2009  . Anemia 10/15/2008  . CAD, NATIVE VESSEL 06/09/2008  . Hyperlipidemia 06/15/2007  . Essential hypertension 06/15/2007  . GERD 06/15/2007  . Fibromyalgia syndrome 06/15/2007  . 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 DIVERTICULOSIS, 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    Past Surgical History:  Procedure Laterality Date  . ABDOMINAL HYSTERECTOMY  1981  . APPENDECTOMY    . CATARACT EXTRACTION, BILATERAL    . CORONARY ARTERY BYPASS GRAFT  2010  . HERNIA REPAIR     INGUINAL  . JOINT REPLACEMENT    . OVARIAN CYST REMOVAL    . REPLACEMENT TOTAL KNEE BILATERAL Bilateral    Rt-1974, Lt -1989  . REVERSE SHOULDER ARTHROPLASTY  09/08/2011   Procedure: REVERSE SHOULDER ARTHROPLASTY;  Surgeon: Nita Sells, MD;  Location: [facility];  Service: Orthopedics;  Laterality: Left;  left reverse total shoulder arthroplasty  . ROTATOR CUFF REPAIR  Left     OB History   None      Home Medications    Prior to Admission medications   Medication Sig Start Date End Date Taking? Authorizing Provider  amLODipine (NORVASC) 5 MG tablet Take 1 tablet (5 mg total) by mouth 2 (two) times daily. 05/13/17  Yes Fay Records, MD  aspirin EC 81 MG tablet Take 81 mg by mouth daily.   Yes [provider]  atorvastatin (LIPITOR) 40 MG tablet Take 1 tablet (40 mg total) by mouth daily. 09/10/17  Yes Fay Records, MD  carvedilol (COREG) 6.25 MG tablet TAKE ONE TABLET BY MOUTH TWICE A DAY WITH A MEAL Patient taking differently:  Take 6.25 mg by mouth 2 (two) times daily.  05/13/17  Yes Fay Records, MD  cetirizine (ZYRTEC) 10 MG tablet TAKE ONE TABLET BY MOUTH DAILY Patient taking differently: Take 10 mg by mouth at bedtime.  02/22/17  Yes Burns, Claudina Lick, MD  diclofenac sodium (VOLTAREN) 1 % GEL Apply 2 g topically 4 (four) times daily as needed (pain).   Yes [provider]  donepezil (ARICEPT) 10 MG tablet Take 1 tablet daily Patient taking differently: Take 10 mg by mouth daily.  07/14/17  Yes Cameron Sprang, MD  DULoxetine (CYMBALTA) 30 MG capsule Take 1 capsule (30 mg total) by mouth daily. 09/14/17  Yes Burns, Claudina Lick, MD  gabapentin (NEURONTIN) 300 MG capsule TAKE ONE CAPSULE BY MOUTH TWICE DAILY. Patient taking differently: Take 300 mg by mouth at bedtime.  09/06/17  Yes Burns, Claudina Lick, MD  hydrochlorothiazide (HYDRODIURIL) 12.5 MG tablet Take 1 tablet (12.5 mg total) by mouth daily. 05/13/17  Yes Fay Records, MD  meloxicam (MOBIC) 7.5 MG tablet Take 1 tablet (7.5 mg total) by mouth daily. 09/14/17  Yes Burns, Claudina Lick, MD  memantine (NAMENDA) 10 MG tablet Take 1 tablet (10 mg total) by mouth 2 (two) times daily. 07/14/17  Yes Cameron Sprang, MD  omeprazole (PRILOSEC) 40 MG capsule TAKE 1 CAPSULE (40 MG TOTAL) BY MOUTH DAILY ON AN EMPTY STOMACH Patient taking differently: Take 40 mg by mouth daily.  05/13/17  Yes Fay Records, MD  ramipril (ALTACE) 10 MG capsule Take 1 capsule (10 mg total) by mouth 2 (two) times daily. 06/28/17  Yes Burns, Claudina Lick, MD  traMADol (ULTRAM) 50 MG tablet Take 50 mg by mouth every 8 (eight) hours as needed for pain. 10/26/17  Yes [provider]    Family History Family History  Problem Relation Age of Onset  . Cancer Mother        Stomach  . Stomach cancer Mother 60  . Hypertension Mother   . Heart disease Father 26       MI  . Diabetes Father   . Heart attack Father   . Diabetes Daughter   . Hypertension Daughter   . Vascular Disease Sister   . Stroke Sister    . Dementia Sister   . Diabetes Sister   . Parkinsonism Sister   . Stroke Sister   . Heart disease Sister   . Heart attack Brother   . Anesthesia problems Neg Hx   . Colon cancer Neg Hx     Social History Social History   Tobacco Use  . Smoking status: Former Smoker    Last attempt to quit: 04/26/1970    Years since quitting: 47.6  . Smokeless tobacco: Never Used  Substance Use Topics  . Alcohol  use: Yes    Alcohol/week: 1.0 standard drinks    Types: 1 Glasses of wine per week  . Drug use: No     Allergies   Patient has no known allergies.   Review of Systems Review of Systems  Constitutional: Negative for chills and fever.  Respiratory: Negative for shortness of breath.   Cardiovascular: Negative for chest pain.  Gastrointestinal: Negative for diarrhea, nausea and vomiting.     Physical Exam Updated Vital Signs BP 128/71 (BP Location: Right Arm)   Pulse (!) 59   Temp 98.7 F (37.1 C) (Oral)   Resp 16   SpO2 98%   Physical Exam  Constitutional: She appears well-nourished. No distress.  HENT:  Head: Normocephalic.  Neck: Normal range of motion.  Cardiovascular: Normal rate, regular rhythm and normal heart sounds. Exam reveals no gallop and no friction rub.  No murmur heard. Thready PT and DP pulses bilaterally  Pulmonary/Chest: Effort normal and breath sounds normal. No respiratory distress. She has no wheezes. She has no rales.  Abdominal: Soft. She exhibits no mass. There is no tenderness.  Musculoskeletal: Normal range of motion. She exhibits edema and tenderness.  Neurological: She is alert.  Skin: Skin is warm. She is not diaphoretic. There is erythema.  Psychiatric: She has a normal mood and affect.       ED Treatments / Results  Labs (all labs ordered are listed, but only abnormal results are displayed) Labs Reviewed  BASIC METABOLIC PANEL - Abnormal; Notable for the following components:      Result Value   Glucose, Bld 125 (*)    GFR  calc non Af Amer 55 (*)    All other components within normal limits  CBC - Abnormal; Notable for the following components:   RBC 3.80 (*)    Hemoglobin 11.6 (*)    All other components within normal limits  AEROBIC CULTURE (SUPERFICIAL SPECIMEN)  CULTURE, BLOOD (ROUTINE X 2)  CULTURE, BLOOD (ROUTINE X 2)  COMPREHENSIVE METABOLIC PANEL  CBC    EKG None  Radiology Dg Foot Complete Left  Result Date: 12/05/2017 CLINICAL DATA:  Swelling and erythema. EXAM: LEFT FOOT - COMPLETE 3+ VIEW COMPARISON:  11/28/2017 FINDINGS: There is no evidence of fracture or dislocation. Osteoarthritic changes at the first metatarsophalangeal joint. Vascular calcifications noted. Forefoot soft tissue swelling. IMPRESSION: No acute osseous abnormality identified about the left foot. Forefoot soft tissue swelling. Electronically Signed   By: Fidela Salisbury M.D.   On: 12/05/2017 21:03    Procedures .Marland KitchenIncision and Drainage Date/Time: 12/05/2017 5:20 PM Performed by: Caroline More, DO Authorized by: Elnora Morrison, MD   Consent:    Consent obtained:  Verbal   Consent given by:  Patient Location:    Type:  Abscess   Location:  Lower extremity   Lower extremity location:  Foot   Foot location:  L foot Pre-procedure details:    Skin preparation:  Betadine Anesthesia (see MAR for exact dosages):    Anesthesia method:  Local infiltration   Local anesthetic:  Lidocaine 1% w/o epi Procedure type:    Complexity:  Simple Procedure details:    Incision types:  Elliptical   Scalpel blade:  10   Wound management:  Irrigated with saline   Drainage:  Purulent and bloody   Drainage amount:  Moderate   Wound treatment:  Wound left open   Packing materials:  None Post-procedure details:    Patient tolerance of procedure:  Tolerated well, no immediate complications   (  including critical care time)  Medications Ordered in ED Medications  0.9 %  sodium chloride infusion (1,000 mLs Intravenous New  Bag/Given 12/05/17 1720)  aspirin EC tablet 81 mg (has no administration in time range)  amLODipine (NORVASC) tablet 5 mg (has no administration in time range)  carvedilol (COREG) tablet 6.25 mg (has no administration in time range)  hydrochlorothiazide (HYDRODIURIL) tablet 12.5 mg (has no administration in time range)  pantoprazole (PROTONIX) EC tablet 40 mg (has no administration in time range)  ramipril (ALTACE) capsule 10 mg (has no administration in time range)  donepezil (ARICEPT) tablet 10 mg (has no administration in time range)  memantine (NAMENDA) tablet 10 mg (has no administration in time range)  gabapentin (NEURONTIN) capsule 300 mg (has no administration in time range)  atorvastatin (LIPITOR) tablet 40 mg (has no administration in time range)  DULoxetine (CYMBALTA) DR capsule 30 mg (has no administration in time range)  meloxicam (MOBIC) tablet 7.5 mg (has no administration in time range)  traMADol (ULTRAM) tablet 50 mg (has no administration in time range)  enoxaparin (LOVENOX) injection 40 mg (has no administration in time range)  0.9 %  sodium chloride infusion ( Intravenous New Bag/Given 12/05/17 2024)  acetaminophen (TYLENOL) tablet 650 mg (has no administration in time range)    Or  acetaminophen (TYLENOL) suppository 650 mg (has no administration in time range)  HYDROcodone-acetaminophen (NORCO/VICODIN) 5-325 MG per tablet 1-2 tablet (has no administration in time range)  senna-docusate (Senokot-S) tablet 1 tablet (has no administration in time range)  bisacodyl (DULCOLAX) EC tablet 5 mg (has no administration in time range)  ondansetron (ZOFRAN) tablet 4 mg (has no administration in time range)    Or  ondansetron (ZOFRAN) injection 4 mg (has no administration in time range)  insulin aspart (novoLOG) injection 0-9 Units (has no administration in time range)  lidocaine (PF) (XYLOCAINE) 1 % injection 5 mL (5 mLs Infiltration Given 12/05/17 1720)  clindamycin (CLEOCIN) IVPB  900 mg (0 mg Intravenous Stopped 12/05/17 1832)  acetaminophen (TYLENOL) tablet 650 mg (650 mg Oral Given 12/05/17 1720)     Initial Impression / Assessment and Plan / ED Course  I have reviewed the triage vital signs and the nursing notes.  Pertinent labs & imaging results that were available during my care of the patient were reviewed by me and considered in my medical decision making (see chart for details).     Patient is an 84 year old female presenting with worsening erythema and no new pus formation of left lower extremity.  Patient has no systemic symptoms at this time of fevers, chills, nausea, vomiting, diarrhea.  Patient has failed outpatient doxycycline and Augmentin.  No MRSA coverage at this time as Augmentin does not cover MRSA.  We will plan to give patient IV clindamycin to cover for MRSA.  Can give Tylenol as needed for pain.  We will also perform I&D at bedside to help relieve some pressure and release pus. Will also obtain wound cultures at that time. Given that patient has no systemic symptoms no blood cultures warranted at this time.  We will plan on reassessing patient after clindamycin dose to determine whether patient would need inpatient admission for continued IV antibiotics versus discharge with follow-up with podiatry tomorrow for surgical removal of foreign body.  5:22PM: I&D performed with supervision by Dr. Reather Converse.  Patient tolerated procedure well.  Moderate amount of bloody purulent drainage expelled.  No packing needed at this time.  Attempted to call Dr. Fritzi Mandes to  discuss care however no answer.  Will attempt to recall.  Patient will likely need to be discharged with oral antibiotics with close follow-up with podiatry tomorrow for surgery.  6:16PM: Attempted to call podiatry a second time with no answer.  Discussed plan of possible discharge with patient and her family.  Patient and her children are very uncomfortable with discharge.  Patient's daughter says she was  able to speak with podiatrist earlier today who stated that when patient comes to [facility] this problem will likely be resolved, which patient's daughter took as foreign body would be removed here at [facility].  Patient's daughter does not feel comfortable being discharged and following up with podiatry tomorrow.  We will plan to place consult hospital service as well as orthopedic surgery for admission for antibiotics as well as removal of foreign body.  Will order to orthopedics for recommendations of imaging.  6:31PM: Discussed with Dr. Reesa Chew, [facility], who agreed for admission. Will plan to obtain imaging recommendations from orthopedics. Consult has been placed to orthopedic surgery.   8:16PM: Discussed with Dr. Corine Shelter, orthopedic surgeon on-call.  Per orthopedic surgery would like plain films of left foot as well as MRI without contrast of left foot.  Orders have been placed.  Per orthopedics they will review these films and then consult. Per Dr. Corine Shelter patient is seen by [facility]. Have paged [facility] to discuss patients. If they cannot see patient Dr. Corine Shelter has accepted to see patient.   Have spoke to [facility] surgeon on call who stated that patient has left the practice. Have repaged Dr. Corine Shelter who has accepted patient for consult.   Have discussed patient with Dr. Reather Converse who independently examined patient and agrees with plan.    Final Clinical Impressions(s) / ED Diagnoses   Final diagnoses:  Cellulitis and abscess of foot    ED Discharge Orders    None       Caroline More, DO 12/05/17 2017    Caroline More, DO 12/05/17 2116    Elnora Morrison, MD 12/06/17 [phone]

## 2017-12-05 NOTE — ED Triage Notes (Signed)
Onset over 1 week ago pt stepped on toothpick between first and second toes on left foot.  Pt took antibiotics and is scheduled to have foot drained tomorrow with podiatry.  Pt and daughter concerned that foot looks worse, has blister.

## 2017-12-05 NOTE — ED Notes (Signed)
Dr, Rosemary Holms (podiatrist) on [address] (scheduled for surgery in am at a day surgery center [facility])

## 2017-12-05 NOTE — H&P (Signed)
History and Physical    [name] [name] DOB: 03-22-1931 DOA: 12/05/2017  PCP: Binnie Rail, MD Patient coming from: Home  Chief Complaint: Left lower extremity pain and swelling  HPI: [name] is a 84 y.o. female with medical history significant of past medical history of CAD status post CABG in 2009, hyperlipidemia, hypertension, diabetes mellitus type 2-no longer any medications, peripheral neuropathy came to the hospital for evaluation of left lower extremity swelling and cellulitis.  Patient states about 10 days ago she stepped on a toothpick but over the course of 2 days the area became more swollen and erythematous.  She went to an urgent care where she was started on doxycycline and advised to follow-up with primary care physician.  She follow with her primary care physician on previous Thursday and was immediately sent to podiatry given the amount of swelling.  Podiatrist placed on Augmentin and scheduled her for an x-ray which showed small foreign object.  She is scheduled for surgery tomorrow morning on 12/06/2017 at an outpatient surgical center but due to the worsening of the swelling and pain she came to the ER tonight. In the ER patient was noted to have significant swelling of the area.  Hemodynamically stable but on physical exam erythema and concerns of pus between her first and second digits of her left foot.  Bedside incision and drainage was performed by the ER provider.  Orthopedic was consulted- evaluation is pending.  Medical team was requested to admit the patient.  Patient was started on IV clindamycin.  When I saw the patient at bedside she was quite comfortable and did not have any new complaints.  She denied any chest pain, shortness of breath, abdominal pain, nausea, vomiting.  She quit smoking several years ago, drinks alcohol maybe once a year, denies any illicit drug use. She wishes to be full code.   Review of Systems: As per HPI otherwise  10 point review of systems negative.  Review of Systems Otherwise negative except as per HPI, including: General: Denies fever, chills, night sweats or unintended weight loss. Resp: Denies cough, wheezing, shortness of breath. Cardiac: Denies chest pain, palpitations, orthopnea, paroxysmal nocturnal dyspnea. GI: Denies abdominal pain, nausea, vomiting, diarrhea or constipation GU: Denies dysuria, frequency, hesitancy or incontinence MS: Denies muscle aches, joint pain or swelling Neuro: Denies headache, neurologic deficits (focal weakness, numbness, tingling), abnormal gait Psych: Denies anxiety, depression, SI/HI/AVH Skin: Denies new rashes or lesions ID: Denies sick contacts, exotic exposures, travel  Past Medical History:  Diagnosis Date  . CAD, NATIVE VESSEL 06/09/2008  . DIABETES MELLITUS, TYPE II   . FIBROMYALGIA   . GERD   . Gout   . HIATAL HERNIA   . HYPERLIPIDEMIA   . HYPERTENSION   . Insomnia   . OA (osteoarthritis)    severe, shoulders, hands - inflammatory, ?RA  . S/P CABG x 1   . S/P inguinal hernia repair   . TUBULOVILLOUS ADENOMA, 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   colo 04/2012 - no polyps - no further screening planned (age >75)  . UNSPECIFIED ANEMIA     Past Surgical History:  Procedure Laterality Date  . ABDOMINAL HYSTERECTOMY  1981  . APPENDECTOMY    . CATARACT EXTRACTION, BILATERAL    . CORONARY ARTERY BYPASS GRAFT  2010  . HERNIA REPAIR     INGUINAL  . JOINT REPLACEMENT    . OVARIAN CYST REMOVAL    . REPLACEMENT TOTAL KNEE BILATERAL Bilateral    Rt-1974, Lt -  [facility] ARTHROPLASTY  09/08/2011   Procedure: REVERSE SHOULDER ARTHROPLASTY;  Surgeon: Nita Sells, MD;  Location: [facility];  Service: Orthopedics;  Laterality: Left;  left reverse total shoulder arthroplasty  . ROTATOR CUFF REPAIR     Left    SOCIAL HISTORY:  reports that she quit smoking about 47 years ago. She has never used smokeless tobacco. She reports that she drinks about  1.0 standard drinks of alcohol per week. She reports that she does not use drugs.  No Known Allergies  FAMILY HISTORY: Family History  Problem Relation Age of Onset  . Cancer Mother        Stomach  . Stomach cancer Mother 51  . Hypertension Mother   . Heart disease Father 68       MI  . Diabetes Father   . Heart attack Father   . Diabetes Daughter   . Hypertension Daughter   . Vascular Disease Sister   . Stroke Sister   . Dementia Sister   . Diabetes Sister   . Parkinsonism Sister   . Stroke Sister   . Heart disease Sister   . Heart attack Brother   . Anesthesia problems Neg Hx   . Colon cancer Neg Hx      Prior to Admission medications   Medication Sig Start Date End Date Taking? Authorizing Provider  amLODipine (NORVASC) 5 MG tablet Take 1 tablet (5 mg total) by mouth 2 (two) times daily. 05/13/17  Yes Fay Records, MD  aspirin EC 81 MG tablet Take 81 mg by mouth daily.   Yes [provider]  atorvastatin (LIPITOR) 40 MG tablet Take 1 tablet (40 mg total) by mouth daily. 09/10/17  Yes Fay Records, MD  carvedilol (COREG) 6.25 MG tablet TAKE ONE TABLET BY MOUTH TWICE A DAY WITH A MEAL Patient taking differently: Take 6.25 mg by mouth 2 (two) times daily.  05/13/17  Yes Fay Records, MD  cetirizine (ZYRTEC) 10 MG tablet TAKE ONE TABLET BY MOUTH DAILY Patient taking differently: Take 10 mg by mouth at bedtime.  02/22/17  Yes Burns, Claudina Lick, MD  diclofenac sodium (VOLTAREN) 1 % GEL Apply 2 g topically 4 (four) times daily as needed (pain).   Yes [provider]  donepezil (ARICEPT) 10 MG tablet Take 1 tablet daily Patient taking differently: Take 10 mg by mouth daily.  07/14/17  Yes Cameron Sprang, MD  DULoxetine (CYMBALTA) 30 MG capsule Take 1 capsule (30 mg total) by mouth daily. 09/14/17  Yes Burns, Claudina Lick, MD  gabapentin (NEURONTIN) 300 MG capsule TAKE ONE CAPSULE BY MOUTH TWICE DAILY. Patient taking differently: Take 300 mg by mouth at bedtime.  09/06/17   Yes Burns, Claudina Lick, MD  hydrochlorothiazide (HYDRODIURIL) 12.5 MG tablet Take 1 tablet (12.5 mg total) by mouth daily. 05/13/17  Yes Fay Records, MD  meloxicam (MOBIC) 7.5 MG tablet Take 1 tablet (7.5 mg total) by mouth daily. 09/14/17  Yes Burns, Claudina Lick, MD  memantine (NAMENDA) 10 MG tablet Take 1 tablet (10 mg total) by mouth 2 (two) times daily. 07/14/17  Yes Cameron Sprang, MD  omeprazole (PRILOSEC) 40 MG capsule TAKE 1 CAPSULE (40 MG TOTAL) BY MOUTH DAILY ON AN EMPTY STOMACH Patient taking differently: Take 40 mg by mouth daily.  05/13/17  Yes Fay Records, MD  ramipril (ALTACE) 10 MG capsule Take 1 capsule (10 mg total) by mouth 2 (two) times daily. 06/28/17  Yes  Binnie Rail, MD  traMADol (ULTRAM) 50 MG tablet Take 50 mg by mouth every 8 (eight) hours as needed for pain. 10/26/17  Yes [provider]    Physical Exam: Vitals:   12/05/17 1331  BP: 115/60  Pulse: 65  Resp: 16  Temp: 98 F (36.7 C)  TempSrc: Oral  SpO2: 100%      Constitutional: NAD, calm, comfortable Eyes: PERRL, lids and conjunctivae normal ENMT: Mucous membranes are moist. Posterior pharynx clear of any exudate or lesions.Normal dentition.  Neck: normal, supple, no masses, no thyromegaly Respiratory: clear to auscultation bilaterally, no wheezing, no crackles. Normal respiratory effort. No accessory muscle use.  Cardiovascular: Regular rate and rhythm, no murmurs / rubs / gallops. No extremity edema. 2+ pedal pulses. No carotid bruits.  Abdomen: no tenderness, no masses palpated. No hepatosplenomegaly. Bowel sounds positive.  Musculoskeletal: no clubbing / cyanosis. No joint deformity upper and lower extremities. Good ROM, no contractures. Normal muscle tone.  Skin: Left lower extremity erythema noted starting in between first and second toe up to her ankles.  Status post incision and drainage in that area. Neurologic: CN 2-12 grossly intact. Sensation intact, DTR normal. Strength 5/5 in all 4.    Psychiatric: Normal judgment and insight. Alert and oriented x 3. Normal mood.     Labs on Admission: I have personally reviewed following labs and imaging studies  CBC: Recent Labs  Lab 12/05/17 1404  WBC 7.1  HGB 11.6*  HCT 37.2  MCV 97.9  PLT 353   Basic Metabolic Panel: Recent Labs  Lab 12/05/17 1404  NA 138  K 4.7  CL 105  CO2 25  GLUCOSE 125*  BUN 15  CREATININE 0.92  CALCIUM 9.1   GFR: CrCl cannot be calculated (Unknown ideal weight.). Liver Function Tests: No results for input(s): AST, ALT, ALKPHOS, BILITOT, PROT, ALBUMIN in the last 168 hours. No results for input(s): LIPASE, AMYLASE in the last 168 hours. No results for input(s): AMMONIA in the last 168 hours. Coagulation Profile: No results for input(s): INR, PROTIME in the last 168 hours. Cardiac Enzymes: No results for input(s): CKTOTAL, CKMB, CKMBINDEX, TROPONINI in the last 168 hours. BNP (last 3 results) No results for input(s): PROBNP in the last 8760 hours. HbA1C: No results for input(s): HGBA1C in the last 72 hours. CBG: No results for input(s): GLUCAP in the last 168 hours. Lipid Profile: No results for input(s): CHOL, HDL, LDLCALC, TRIG, CHOLHDL, LDLDIRECT in the last 72 hours. Thyroid Function Tests: No results for input(s): TSH, T4TOTAL, FREET4, T3FREE, THYROIDAB in the last 72 hours. Anemia Panel: No results for input(s): VITAMINB12, FOLATE, FERRITIN, TIBC, IRON, RETICCTPCT in the last 72 hours. Urine analysis:    Component Value Date/Time   COLORURINE YELLOW 04/17/2014 1254   APPEARANCEUR CLEAR 04/17/2014 1254   LABSPEC 1.012 04/17/2014 1254   PHURINE 7.5 04/17/2014 1254   GLUCOSEU NEGATIVE 04/17/2014 1254   GLUCOSEU NEGATIVE 06/09/2013 1718   HGBUR NEGATIVE 04/17/2014 1254   BILIRUBINUR NEGATIVE 04/17/2014 1254   KETONESUR NEGATIVE 04/17/2014 1254   PROTEINUR 30 (A) 04/17/2014 1254   UROBILINOGEN 0.2 04/17/2014 1254   NITRITE NEGATIVE 04/17/2014 1254   LEUKOCYTESUR TRACE  (A) 04/17/2014 1254   Sepsis Labs: !!!!!!!!!!!!!!!!!!!!!!!!!!!!!!!!!!!!!!!!!!!! @LABRCNTIP (procalcitonin:4,lacticidven:4) )No results found for this or any previous visit (from the past 240 hour(s)).   Radiological Exams on Admission: No results found.   All images have been reviewed by me personally.  EKG: Pending  Assessment/Plan Active Problems:   Cellulitis of foot  Left lower extremity purulent cellulitis - There is still concern for foreign object in her foot therefore orthopedic has been consulted by the ER provider, currently awaiting callback.  Defer for advanced imaging to orthopedic.  Likely will require surgical intervention - Cultures have been ordered.  IV clindamycin. -Pain control, supportive care -We will keep her n.p.o. past midnight in anticipation for possible surgery tomorrow.  History of coronary artery disease status post CABG in 2009 - Currently patient is chest pain-free.  We will get preop EKG.  Otherwise resume her home medications-aspirin, statin, Coreg, ramipril.  Essential hypertension -Continue Norvasc 5 mg daily.  Coreg and ACE inhibitor.  Patient is also on hydrochlorothiazide.  Early cognitive dysfunction -Continue home Aricept and Namenda.   DVT prophylaxis: Lovenox to be started tomorrow after her surgery. Code Status: Full code Family Communication: Husband and daughter present at bedside Disposition Plan:  TBD Consults called: Ortho called by the ED provider  Admission status: Inpatient    Time Spent: 65 minutes.  >50% of the time was devoted to discussing the patients care, assessment, plan and disposition with other care givers along with counseling the patient about the risks and benefits of treatment.   Please Note: This patient record was dictated using Editor, commissioning. Chart creation errors have been sought, but may not always have been located. Such creation errors do not reflect on the Standard of Medical Care.   Ankit  Arsenio Loader MD [facility] Pager [phone]  If 7PM-7AM, please contact night-coverage [email or web address] Password [facility]  12/05/2017, 6:58 PM

## 2017-12-06 ENCOUNTER — Inpatient Hospital Stay (HOSPITAL_COMMUNITY): Payer: Medicare Other

## 2017-12-06 ENCOUNTER — Encounter (HOSPITAL_COMMUNITY): Payer: Self-pay | Admitting: *Deleted

## 2017-12-06 ENCOUNTER — Other Ambulatory Visit: Payer: Self-pay

## 2017-12-06 DIAGNOSIS — L02619 Cutaneous abscess of unspecified foot: Secondary | ICD-10-CM

## 2017-12-06 LAB — GLUCOSE, CAPILLARY
Glucose-Capillary: 101 mg/dL — ABNORMAL HIGH (ref 70–99)
Glucose-Capillary: 147 mg/dL — ABNORMAL HIGH (ref 70–99)
Glucose-Capillary: 95 mg/dL (ref 70–99)

## 2017-12-06 LAB — COMPREHENSIVE METABOLIC PANEL
ALT: 14 U/L (ref 0–44)
AST: 18 U/L (ref 15–41)
Albumin: 2.8 g/dL — ABNORMAL LOW (ref 3.5–5.0)
Alkaline Phosphatase: 66 U/L (ref 38–126)
Anion gap: 7 (ref 5–15)
BUN: 12 mg/dL (ref 8–23)
CO2: 27 mmol/L (ref 22–32)
Calcium: 8.8 mg/dL — ABNORMAL LOW (ref 8.9–10.3)
Chloride: 104 mmol/L (ref 98–111)
Creatinine, Ser: 0.7 mg/dL (ref 0.44–1.00)
GFR calc Af Amer: >60 mL/min (ref >60)
GFR calc non Af Amer: >60 mL/min (ref >60)
Glucose, Bld: 100 mg/dL — ABNORMAL HIGH (ref 70–99)
Potassium: 4.1 mmol/L (ref 3.5–5.1)
Sodium: 138 mmol/L (ref 135–145)
Total Bilirubin: 0.6 mg/dL (ref 0.3–1.2)
Total Protein: 5.9 g/dL — ABNORMAL LOW (ref 6.5–8.1)

## 2017-12-06 LAB — CBC
HCT: 34.3 % — ABNORMAL LOW (ref 36.0–46.0)
Hemoglobin: 11.2 g/dL — ABNORMAL LOW (ref 12.0–15.0)
MCH: 31.3 pg (ref 26.0–34.0)
MCHC: 32.7 g/dL (ref 30.0–36.0)
MCV: 95.8 fL (ref 78.0–100.0)
Platelets: 214 10*3/uL (ref 150–400)
RBC: 3.58 MIL/uL — ABNORMAL LOW (ref 3.87–5.11)
RDW: 12.1 % (ref 11.5–15.5)
WBC: 6.1 10*3/uL (ref 4.0–10.5)

## 2017-12-06 LAB — MRSA PCR SCREENING: MRSA by PCR: NEGATIVE

## 2017-12-06 MED ORDER — SODIUM CHLORIDE 0.9 % IV SOLN
INTRAVENOUS | Status: DC
Start: 2017-12-06 — End: 2017-12-06

## 2017-12-06 MED ORDER — LACTATED RINGERS IV SOLN
INTRAVENOUS | Status: AC
  Administered 2017-12-07: 01:00:00 via INTRAVENOUS

## 2017-12-06 NOTE — Progress Notes (Signed)
@IPLOG @        PROGRESS NOTE                                                                                                                                                                                                             Patient Demographics:    [name], is a 84 y.o. female, DOB - April 03, 1931, QMG:[id number]  Admit date - 12/05/2017   Admitting Physician Ankit Arsenio Loader, MD  Outpatient Primary MD for the patient is Binnie Rail, MD  LOS - 1  Chief Complaint  Patient presents with  . Cellulitis       Brief Narrative  [name] is a 84 y.o. female with medical history significant of past medical history of CAD status post CABG in 2009, hyperlipidemia, hypertension, diabetes mellitus type 2-no longer any medications, peripheral neuropathy came to the hospital for evaluation of left lower extremity swelling and cellulitis.  Patient states about 10 days ago she stepped on a toothpick but over the course of 2 days the area became more swollen and erythematous.  She went to an urgent care where she was started on doxycycline and advised to follow-up with primary care physician.  She follow with her primary care physician on previous Thursday and was immediately sent to podiatry given the amount of swelling.  Podiatrist placed on Augmentin and scheduled her for an x-ray which showed small foreign object.  She is scheduled for surgery tomorrow morning on 12/06/2017 at an outpatient surgical center but due to the worsening of the swelling and pain she came to the ER, here she was diagnosed with abscess along with foreign body retention at the base of her left foot first toe plantar aspect and admitted to the hospital.   Subjective:    Syliva Overman today has, No headache, No chest pain, No abdominal pain - No Nausea, No new weakness tingling or numbness, No Cough - SOB.  Continues to have mild left foot pain.   Assessment  & Plan :     1.  Left foot plantar aspect  abscess with retained foreign body which is a toothpick.  Case discussed with orthopedic surgeon Dr. Stann Mainland, for now continue empiric antibiotics and follow cultures, continue supportive care with pain control.  Patient will go for incision and drainage tomorrow.  2.  Hypertension.  Continue combination of Norvasc, Coreg with ACE inhibitor.  Monitor and adjust as needed.  3.  Dyslipidemia.  On statin continue.  4.  CAD status post CABG in 2009.  No acute issues, stable baseline EKG.  Continue combination of aspirin, statin and Coreg for secondary prevention.  5. Early cognitive dysfunction. Continue home Aricept and Namenda.    Diet :  Diet Order            Diet NPO time specified Except for: Sips with Meds  Diet effective midnight        DIET SOFT Room service appropriate? Yes; Fluid consistency: Thin  Diet effective now                Family Communication  : None present  Code Status : Full  Disposition Plan  : Home in 1 to 2 days after I&D of the left foot  Consults  : Orthopedic surgery Dr. Stann Mainland  Procedures  : Incision and drainage in the ER of the left foot  DVT Prophylaxis  :  Lovenox    Lab Results  Component Value Date   PLT 214 12/06/2017    Inpatient Medications  Scheduled Meds: . amLODipine  5 mg Oral BID  . aspirin EC  81 mg Oral Daily  . atorvastatin  40 mg Oral Daily  . carvedilol  6.25 mg Oral BID  . donepezil  10 mg Oral Daily  . DULoxetine  30 mg Oral Daily  . enoxaparin (LOVENOX) injection  40 mg Subcutaneous Q24H  . gabapentin  300 mg Oral QHS  . hydrochlorothiazide  12.5 mg Oral Daily  . insulin aspart  0-9 Units Subcutaneous TID WC  . meloxicam  7.5 mg Oral Daily  . memantine  10 mg Oral BID  . pantoprazole  40 mg Oral Daily  . ramipril  10 mg Oral BID   Continuous Infusions: . clindamycin (CLEOCIN) IV 600 mg (12/06/17 1000)  . [START ON 12/07/2017] lactated ringers     PRN Meds:.acetaminophen **OR** acetaminophen, bisacodyl,  HYDROcodone-acetaminophen, ondansetron **OR** ondansetron (ZOFRAN) IV, senna-docusate, traMADol  Antibiotics  :    Anti-infectives (From admission, onward)   Start     Dose/Rate Route Frequency Ordered Stop   12/06/17 0200  clindamycin (CLEOCIN) IVPB 600 mg     600 mg 100 mL/hr over 30 Minutes Intravenous Every 8 hours 12/05/17 2258     12/05/17 1600  clindamycin (CLEOCIN) IVPB 900 mg     900 mg 100 mL/hr over 30 Minutes Intravenous  Once 12/05/17 1551 12/05/17 1832         Objective:   Vitals:   12/05/17 1930 12/05/17 2000 12/06/17 0627 12/06/17 1248  BP: 128/71 (!) 148/59 (!) 123/57 (!) 117/58  Pulse: (!) 59 (!) 57 66 (!) 56  Resp: 16     Temp: 98.7 F (37.1 C) (!) 97.5 F (36.4 C) 98.2 F (36.8 C) 98.6 F (37 C)  TempSrc: Oral Oral Oral Oral  SpO2: 98% 94% 98% 99%    Wt Readings from Last 3 Encounters:  08/19/17 61.7 kg  07/14/17 62.1 kg  05/13/17 59.9 kg     Intake/Output Summary (Last 24 hours) at 12/06/2017 1416 Last data filed at 12/06/2017 0900 Gross per 24 hour  Intake 713.7 ml  Output -  Net 713.7 ml     Physical Exam  Awake Alert, Oriented X 3, No new F.N deficits, Normal affect [place].AT,PERRAL Supple Neck,No JVD, No cervical lymphadenopathy appriciated.  Symmetrical Chest wall movement, Good air movement bilaterally, CTAB RRR,No Gallops,Rubs or new Murmurs, No Parasternal Heave +ve B.Sounds, Abd Soft, No tenderness, No organomegaly appriciated, No rebound - guarding or rigidity. No Cyanosis, Clubbing or  edema, left foot first toe base on the plantar aspect has swelling and fluctuance    Data Review:    CBC Recent Labs  Lab 12/05/17 1404 12/06/17 0403  WBC 7.1 6.1  HGB 11.6* 11.2*  HCT 37.2 34.3*  PLT 229 214  MCV 97.9 95.8  MCH 30.5 31.3  MCHC 31.2 32.7  RDW 12.1 12.1    Chemistries  Recent Labs  Lab 12/05/17 1404 12/06/17 0403  NA 138 138  K 4.7 4.1  CL 105 104  CO2 25 27  GLUCOSE 125* 100*  BUN 15 12  CREATININE 0.92  0.70  CALCIUM 9.1 8.8*  AST  --  18  ALT  --  14  ALKPHOS  --  66  BILITOT  --  0.6   ------------------------------------------------------------------------------------------------------------------ No results for input(s): CHOL, HDL, LDLCALC, TRIG, CHOLHDL, LDLDIRECT in the last 72 hours.  Lab Results  Component Value Date   HGBA1C 5.9 08/19/2017   ------------------------------------------------------------------------------------------------------------------ No results for input(s): TSH, T4TOTAL, T3FREE, THYROIDAB in the last 72 hours.  Invalid input(s): FREET3 ------------------------------------------------------------------------------------------------------------------ No results for input(s): VITAMINB12, FOLATE, FERRITIN, TIBC, IRON, RETICCTPCT in the last 72 hours.  Coagulation profile No results for input(s): INR, PROTIME in the last 168 hours.  No results for input(s): DDIMER in the last 72 hours.  Cardiac Enzymes No results for input(s): CKMB, TROPONINI, MYOGLOBIN in the last 168 hours.  Invalid input(s): CK ------------------------------------------------------------------------------------------------------------------ No results found for: BNP  Micro Results Recent Results (from the past 240 hour(s))  Wound or Superficial Culture     Status: None (Preliminary result)   Collection Time: 12/05/17  5:16 PM  Result Value Ref Range Status   Specimen Description ABSCESS  Final   Special Requests LEFT FOOT  Final   Gram Stain   Final    RARE WBC PRESENT, PREDOMINANTLY PMN RARE GRAM POSITIVE COCCI Performed at [facility], [address], [place], [place] [address]    Culture FEW STAPHYLOCOCCUS AUREUS  Final   Report Status PENDING  Incomplete  Culture, blood (Routine X 2) w Reflex to ID Panel     Status: None (Preliminary result)   Collection Time: 12/05/17  8:22 PM  Result Value Ref Range Status   Specimen Description BLOOD LEFT ARM  Final    Special Requests   Final    BOTTLES DRAWN AEROBIC AND ANAEROBIC Blood Culture adequate volume   Culture   Final    NO GROWTH < 24 HOURS Performed at [facility], [place] [address], [place], [place] [address]    Report Status PENDING  Incomplete  Culture, blood (Routine X 2) w Reflex to ID Panel     Status: None (Preliminary result)   Collection Time: 12/05/17  8:25 PM  Result Value Ref Range Status   Specimen Description BLOOD RIGHT HAND  Final   Special Requests   Final    BOTTLES DRAWN AEROBIC AND ANAEROBIC Blood Culture adequate volume   Culture   Final    NO GROWTH < 24 HOURS Performed at [facility], [place] [address], [place], [place] [address]    Report Status PENDING  Incomplete  MRSA PCR Screening     Status: None   Collection Time: 12/06/17  6:34 AM  Result Value Ref Range Status   MRSA by PCR NEGATIVE NEGATIVE Final    Comment:        The GeneXpert MRSA Assay (FDA approved for NASAL specimens only), is one component of a  comprehensive MRSA colonization surveillance program. It is not intended to diagnose MRSA infection nor to guide or monitor treatment for MRSA infections. Performed at [facility], [place] [address], [place], [place] [address]     Radiology Reports Mr Foot Left Wo Contrast  Result Date: 12/06/2017 CLINICAL DATA:  Cellulitis of the left foot. Foreign body in the soft tissues. EXAM: MRI OF THE LEFT FOOT WITHOUT CONTRAST TECHNIQUE: Multiplanar, multisequence MR imaging of the left foot was performed. No intravenous contrast was administered. COMPARISON:  Radiographs dated 12/05/2017 and 11/28/2017 FINDINGS: Soft tissues There is a 16 x 2 mm foreign body in the soft tissues of the plantar aspect of left foot adjacent to the proximal phalanx of the great toe. This foreign body is best seen on images 7 and 8 of series 7. There is an adjacent soft tissue abscess extending dorsally in the web space between the first and second toes. This abscess  measures approximately 3.1 x 2.6 x 1.3 cm. There is nonspecific subcutaneous edema on the dorsum of the foot at the medial aspect of the forefoot as well as some edema in the plantar aspect of the forefoot, all consistent with cellulitis. Bones/Joint/Cartilage No osteomyelitis or other acute bone abnormality. Slight arthritic changes of the first MTP joint with hallux valgus deformity and bunion formation on the head of the first metatarsal. Ligaments Normal. Muscles and Tendons Normal. IMPRESSION: 1. Splinter like foreign body in the soft tissues of the ball of the foot surrounded by an abscess which extends dorsally in the webspace between the first and second toes as described above. This foreign body is consistent with the patient's report of stepping on a toothpick. 2. Soft tissue edema of the forefoot consistent with cellulitis. 3. No evidence of osteomyelitis or septic joint. Electronically Signed   By: Lorriane Shire M.D.   On: 12/06/2017 09:25   Dg Foot Complete Left  Result Date: 12/05/2017 CLINICAL DATA:  Swelling and erythema. EXAM: LEFT FOOT - COMPLETE 3+ VIEW COMPARISON:  11/28/2017 FINDINGS: There is no evidence of fracture or dislocation. Osteoarthritic changes at the first metatarsophalangeal joint. Vascular calcifications noted. Forefoot soft tissue swelling. IMPRESSION: No acute osseous abnormality identified about the left foot. Forefoot soft tissue swelling. Electronically Signed   By: Fidela Salisbury M.D.   On: 12/05/2017 21:03    Time Spent in minutes  30   Lala Lund M.D on 12/06/2017 at 2:16 PM  To page go to [email or web address] - password [facility]

## 2017-12-06 NOTE — Plan of Care (Signed)

## 2017-12-06 NOTE — Consult Note (Signed)
ORTHOPAEDIC CONSULTATION  REQUESTING PHYSICIAN: Thurnell Lose, MD  PCP:  Binnie Rail, MD  Chief Complaint: Left foot retained foreign body  HPI: [name] is a 84 y.o. female who complains of increasing pain and swelling of the left foot following stepping on a toothpick at her home 1 week ago the Sunday.  She does have a history of type 2 diabetes but states that she had been on oral medications for 5 to 10 years and has since been able to discontinue those due to tightly controlled blood sugars.  She denies any history of neuropathy.  She did present to her PCP who recommended a follow-up with a podiatrist to have the foreign body removed.  Unfortunately, she developed increasing pain and swelling as well as fevers and was recommended to present to the emergency department last night.  In the emergency department there was a small dorsal foot abscess that was drained locally.  This yielded some purulence that was sent for culture.  She was placed on IV antibiotics and admitted to the medicine service.  She states this morning that her pain is actually improved.  I was consulted for evaluation of the foot and review of the MRI.  She does live independently and does not require any assistive devices for ambulation.  Past Medical History:  Diagnosis Date  . CAD, NATIVE VESSEL 06/09/2008  . DIABETES MELLITUS, TYPE II   . FIBROMYALGIA   . GERD   . Gout   . HIATAL HERNIA   . HYPERLIPIDEMIA   . HYPERTENSION   . Insomnia   . OA (osteoarthritis)    severe, shoulders, hands - inflammatory, ?RA  . S/P CABG x 1   . S/P inguinal hernia repair   . 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 UNSPECIFIED ANEMIA    Past Surgical History:  Procedure Laterality Date  . ABDOMINAL HYSTERECTOMY  1981  . APPENDECTOMY    . CATARACT EXTRACTION, BILATERAL    . CORONARY ARTERY BYPASS GRAFT  2010  . HERNIA REPAIR     INGUINAL  . JOINT REPLACEMENT    . OVARIAN CYST REMOVAL    . REPLACEMENT TOTAL KNEE BILATERAL Bilateral    Rt-1974, Lt -1989  . REVERSE SHOULDER ARTHROPLASTY  09/08/2011   Procedure: REVERSE SHOULDER ARTHROPLASTY;  Surgeon: Nita Sells, MD;  Location: [facility];  Service: Orthopedics;  Laterality: Left;  left reverse total shoulder arthroplasty  . ROTATOR CUFF REPAIR     Left   Social History   Socioeconomic History  . Marital status: Widowed    Spouse name: Not on file  . Number of children: Not on file  . Years of education: Not on file  . Highest education level: Not on file  Occupational History  . Not on file  Social Needs  . Financial resource strain: Not on file  . Food insecurity:    Worry: Not on file    Inability: Not on file  . Transportation needs:    Medical: Not on file    Non-medical: Not on file  Tobacco Use  . Smoking status: Former Smoker    Last attempt to quit: 04/26/1970    Years since quitting: 47.6  . Smokeless tobacco: Never Used  Substance and Sexual Activity  . Alcohol use: Yes    Alcohol/week: 1.0 standard drinks    Types: 1 Glasses of wine per week  .  Drug use: No  . Sexual activity: Not on file  Lifestyle  . Physical activity:    Days per week: Not on file    Minutes per session: Not on file  . Stress: Not on file  Relationships  . Social connections:    Talks on phone: Not on file    Gets together: Not on file    Attends religious service: Not on file    Active member of club or organization: Not on file    Attends meetings of clubs or organizations: Not on file    Relationship status: Not on file  Other Topics Concern  . Not on file  Social History Narrative   Widows, lives alone. 2 sons. 2 daughters. Retired Ambulance person   Family History  Problem Relation Age of Onset  . Cancer Mother        Stomach  . Stomach cancer Mother 81  . Hypertension Mother   . Heart disease Father 8       MI  . Diabetes Father   . Heart  attack Father   . Diabetes Daughter   . Hypertension Daughter   . Vascular Disease Sister   . Stroke Sister   . Dementia Sister   . Diabetes Sister   . Parkinsonism Sister   . Stroke Sister   . Heart disease Sister   . Heart attack Brother   . Anesthesia problems Neg Hx   . Colon cancer Neg Hx    No Known Allergies Prior to Admission medications   Medication Sig Start Date End Date Taking? Authorizing Provider  amLODipine (NORVASC) 5 MG tablet Take 1 tablet (5 mg total) by mouth 2 (two) times daily. 05/13/17  Yes Fay Records, MD  aspirin EC 81 MG tablet Take 81 mg by mouth daily.   Yes [provider]  atorvastatin (LIPITOR) 40 MG tablet Take 1 tablet (40 mg total) by mouth daily. 09/10/17  Yes Fay Records, MD  carvedilol (COREG) 6.25 MG tablet TAKE ONE TABLET BY MOUTH TWICE A DAY WITH A MEAL Patient taking differently: Take 6.25 mg by mouth 2 (two) times daily.  05/13/17  Yes Fay Records, MD  cetirizine (ZYRTEC) 10 MG tablet TAKE ONE TABLET BY MOUTH DAILY Patient taking differently: Take 10 mg by mouth at bedtime.  02/22/17  Yes Burns, Claudina Lick, MD  diclofenac sodium (VOLTAREN) 1 % GEL Apply 2 g topically 4 (four) times daily as needed (pain).   Yes [provider]  donepezil (ARICEPT) 10 MG tablet Take 1 tablet daily Patient taking differently: Take 10 mg by mouth daily.  07/14/17  Yes Cameron Sprang, MD  DULoxetine (CYMBALTA) 30 MG capsule Take 1 capsule (30 mg total) by mouth daily. 09/14/17  Yes Burns, Claudina Lick, MD  gabapentin (NEURONTIN) 300 MG capsule TAKE ONE CAPSULE BY MOUTH TWICE DAILY. Patient taking differently: Take 300 mg by mouth at bedtime.  09/06/17  Yes Burns, Claudina Lick, MD  hydrochlorothiazide (HYDRODIURIL) 12.5 MG tablet Take 1 tablet (12.5 mg total) by mouth daily. 05/13/17  Yes Fay Records, MD  meloxicam (MOBIC) 7.5 MG tablet Take 1 tablet (7.5 mg total) by mouth daily. 09/14/17  Yes Burns, Claudina Lick, MD  memantine (NAMENDA) 10 MG tablet Take 1 tablet  (10 mg total) by mouth 2 (two) times daily. 07/14/17  Yes Cameron Sprang, MD  omeprazole (PRILOSEC) 40 MG capsule TAKE 1 CAPSULE (40 MG TOTAL) BY MOUTH DAILY ON AN EMPTY STOMACH Patient  taking differently: Take 40 mg by mouth daily.  05/13/17  Yes Fay Records, MD  ramipril (ALTACE) 10 MG capsule Take 1 capsule (10 mg total) by mouth 2 (two) times daily. 06/28/17  Yes Burns, Claudina Lick, MD  traMADol (ULTRAM) 50 MG tablet Take 50 mg by mouth every 8 (eight) hours as needed for pain. 10/26/17  Yes [provider]   Mr Foot Left Wo Contrast  Result Date: 12/06/2017 CLINICAL DATA:  Cellulitis of the left foot. Foreign body in the soft tissues. EXAM: MRI OF THE LEFT FOOT WITHOUT CONTRAST TECHNIQUE: Multiplanar, multisequence MR imaging of the left foot was performed. No intravenous contrast was administered. COMPARISON:  Radiographs dated 12/05/2017 and 11/28/2017 FINDINGS: Soft tissues There is a 16 x 2 mm foreign body in the soft tissues of the plantar aspect of left foot adjacent to the proximal phalanx of the great toe. This foreign body is best seen on images 7 and 8 of series 7. There is an adjacent soft tissue abscess extending dorsally in the web space between the first and second toes. This abscess measures approximately 3.1 x 2.6 x 1.3 cm. There is nonspecific subcutaneous edema on the dorsum of the foot at the medial aspect of the forefoot as well as some edema in the plantar aspect of the forefoot, all consistent with cellulitis. Bones/Joint/Cartilage No osteomyelitis or other acute bone abnormality. Slight arthritic changes of the first MTP joint with hallux valgus deformity and bunion formation on the head of the first metatarsal. Ligaments Normal. Muscles and Tendons Normal. IMPRESSION: 1. Splinter like foreign body in the soft tissues of the ball of the foot surrounded by an abscess which extends dorsally in the webspace between the first and second toes as described above. This foreign body is  consistent with the patient's report of stepping on a toothpick. 2. Soft tissue edema of the forefoot consistent with cellulitis. 3. No evidence of osteomyelitis or septic joint. Electronically Signed   By: Lorriane Shire M.D.   On: 12/06/2017 09:25   Dg Foot Complete Left  Result Date: 12/05/2017 CLINICAL DATA:  Swelling and erythema. EXAM: LEFT FOOT - COMPLETE 3+ VIEW COMPARISON:  11/28/2017 FINDINGS: There is no evidence of fracture or dislocation. Osteoarthritic changes at the first metatarsophalangeal joint. Vascular calcifications noted. Forefoot soft tissue swelling. IMPRESSION: No acute osseous abnormality identified about the left foot. Forefoot soft tissue swelling. Electronically Signed   By: Fidela Salisbury M.D.   On: 12/05/2017 21:03    Positive ROS: All other systems have been reviewed and were otherwise negative with the exception of those mentioned in the HPI and as above.  Physical Exam: General: Alert, no acute distress Cardiovascular: No pedal edema Respiratory: No cyanosis, no use of accessory musculature GI: No organomegaly, abdomen is soft and non-tender Skin: No lesions in the area of chief complaint Neurologic: Sensation intact distally Psychiatric: Patient is competent for consent with normal mood and affect Lymphatic: No axillary or cervical lymphadenopathy  MUSCULOSKELETAL:  Left foot:   There is receding erythema as noted by the outlined area of previous erythema from the emergency department.  She has a small draining wound on the first interspace dorsally.  She has swelling and pain on the plantar aspect of the metatarsal phalangeal region of the hallux and second third toes.  No open wound there.  The foot is warm and well-perfused and she has motor intact.  She has decreased sensation up to about the distal third of the tibia  in a stocking distribution.  Assessment: 1.  Left foot abscess 2.  Left foot retained foreign body  Plan: -I reviewed the plain  radiographs which are essentially negative with no bone lesions or gas in the soft tissues.  I also reviewed the MRI which did indicate retained foreign body just lateral to the base of the proximal phalanx of the hallux.  There is also a small area of associated purulence likely an abscess.  Due to the retained foreign body in the local reaction I would recommend removal of the toothpick as well as incision and drainage of the abscess with debridement. -We will plan on performing this procedure tomorrow as she is currently not septic or decompensating.  In fact she looks better today now that she is been on IV antibiotics for less than 24 hours. -I would recommend continuing the IV antibiotics as cultures of artery been obtained.  We will plan for operative intervention tomorrow afternoon.  She will need to be n.p.o. tonight at midnight. -I discussed the risks and benefits of the procedure with the patient at length.  She understands those risks and is willing to proceed with surgery tomorrow.    Nicholes Stairs, MD Cell [phone]    12/06/2017 10:08 AM

## 2017-12-07 ENCOUNTER — Inpatient Hospital Stay (HOSPITAL_COMMUNITY): Payer: Medicare Other | Admitting: Anesthesiology

## 2017-12-07 ENCOUNTER — Encounter (HOSPITAL_COMMUNITY): Payer: Self-pay

## 2017-12-07 ENCOUNTER — Encounter (HOSPITAL_COMMUNITY)
Admission: EM | Disposition: A | Discharge status: Home Health | Payer: Self-pay | Source: Home / Self Care | Attending: Internal Medicine

## 2017-12-07 HISTORY — PX: I & D EXTREMITY: SHX5045

## 2017-12-07 LAB — SURGICAL PCR SCREEN
MRSA, PCR: NEGATIVE
Staphylococcus aureus: NEGATIVE

## 2017-12-07 LAB — GLUCOSE, CAPILLARY
Glucose-Capillary: 126 mg/dL — ABNORMAL HIGH (ref 70–99)
Glucose-Capillary: 99 mg/dL (ref 70–99)
Glucose-Capillary: 109 mg/dL — ABNORMAL HIGH (ref 70–99)
Glucose-Capillary: 151 mg/dL — ABNORMAL HIGH (ref 70–99)
Glucose-Capillary: 129 mg/dL — ABNORMAL HIGH (ref 70–99)
Glucose-Capillary: 127 mg/dL — ABNORMAL HIGH (ref 70–99)

## 2017-12-07 SURGERY — IRRIGATION AND DEBRIDEMENT EXTREMITY
Anesthesia: General | Site: Foot | Laterality: Left

## 2017-12-07 MED ORDER — CHLORHEXIDINE GLUCONATE 4 % EX LIQD
60.0000 mL | Freq: Once | CUTANEOUS | Status: AC
  Administered 2017-12-07: 4 via TOPICAL

## 2017-12-07 MED ORDER — POVIDONE-IODINE 10 % EX SWAB: 2.0000 "application " | Freq: Once | CUTANEOUS | Status: DC

## 2017-12-07 MED ORDER — METOCLOPRAMIDE HCL 5 MG/ML IJ SOLN: 5.0000 mg | Freq: Three times a day (TID) | INTRAMUSCULAR | Status: DC | PRN

## 2017-12-07 MED ORDER — LIDOCAINE HCL (CARDIAC) PF 100 MG/5ML IV SOSY
PREFILLED_SYRINGE | INTRAVENOUS | Status: DC | PRN
  Administered 2017-12-07: 40 mg via INTRAVENOUS

## 2017-12-07 MED ORDER — ONDANSETRON HCL 4 MG/2ML IJ SOLN: 4.0000 mg | Freq: Four times a day (QID) | INTRAMUSCULAR | Status: DC | PRN

## 2017-12-07 MED ORDER — PROPOFOL 10 MG/ML IV BOLUS
INTRAVENOUS | Status: AC
  Filled 2017-12-07: qty 20

## 2017-12-07 MED ORDER — PROMETHAZINE HCL 25 MG/ML IJ SOLN: 6.2500 mg | INTRAMUSCULAR | Status: DC | PRN

## 2017-12-07 MED ORDER — FENTANYL CITRATE (PF) 100 MCG/2ML IJ SOLN
INTRAMUSCULAR | Status: DC | PRN
  Administered 2017-12-07 (×2): 50 ug via INTRAVENOUS
  Administered 2017-12-07: 25 ug via INTRAVENOUS

## 2017-12-07 MED ORDER — MUPIROCIN 2 % EX OINT
1.0000 "application " | TOPICAL_OINTMENT | Freq: Two times a day (BID) | CUTANEOUS | Status: AC
  Administered 2017-12-07 – 2017-12-08 (×4): 1 via NASAL
  Filled 2017-12-07 (×3): qty 22

## 2017-12-07 MED ORDER — PROPOFOL 10 MG/ML IV BOLUS
INTRAVENOUS | Status: DC | PRN
  Administered 2017-12-07: 40 mg via INTRAVENOUS
  Administered 2017-12-07: 100 mg via INTRAVENOUS

## 2017-12-07 MED ORDER — DOCUSATE SODIUM 100 MG PO CAPS
100.0000 mg | ORAL_CAPSULE | Freq: Two times a day (BID) | ORAL | Status: DC
  Administered 2017-12-07 – 2017-12-09 (×4): 100 mg via ORAL
  Filled 2017-12-07 (×5): qty 1

## 2017-12-07 MED ORDER — FENTANYL CITRATE (PF) 100 MCG/2ML IJ SOLN
INTRAMUSCULAR | Status: AC
  Filled 2017-12-07: qty 4

## 2017-12-07 MED ORDER — FENTANYL CITRATE (PF) 250 MCG/5ML IJ SOLN
INTRAMUSCULAR | Status: AC
  Filled 2017-12-07: qty 5

## 2017-12-07 MED ORDER — ONDANSETRON HCL 4 MG PO TABS: 4.0000 mg | ORAL_TABLET | Freq: Four times a day (QID) | ORAL | Status: DC | PRN

## 2017-12-07 MED ORDER — LACTATED RINGERS IV SOLN
INTRAVENOUS | Status: DC
  Administered 2017-12-07: 13:00:00 via INTRAVENOUS

## 2017-12-07 MED ORDER — FENTANYL CITRATE (PF) 100 MCG/2ML IJ SOLN
25.0000 ug | INTRAMUSCULAR | Status: DC | PRN
  Administered 2017-12-07 (×3): 50 ug via INTRAVENOUS

## 2017-12-07 MED ORDER — METOCLOPRAMIDE HCL 5 MG PO TABS: 5.0000 mg | ORAL_TABLET | Freq: Three times a day (TID) | ORAL | Status: DC | PRN

## 2017-12-07 MED ORDER — CEFAZOLIN SODIUM-DEXTROSE 1-4 GM/50ML-% IV SOLN
1.0000 g | Freq: Three times a day (TID) | INTRAVENOUS | Status: DC
  Administered 2017-12-07 – 2017-12-09 (×6): 1 g via INTRAVENOUS
  Filled 2017-12-07 (×8): qty 50

## 2017-12-07 MED ORDER — CEFAZOLIN SODIUM-DEXTROSE 2-4 GM/100ML-% IV SOLN
2.0000 g | INTRAVENOUS | Status: DC
  Filled 2017-12-07: qty 100

## 2017-12-07 MED ORDER — SODIUM CHLORIDE 0.9 % IR SOLN
Status: DC | PRN
  Administered 2017-12-07: 3000 mL

## 2017-12-07 MED ORDER — ENOXAPARIN SODIUM 30 MG/0.3ML ~~LOC~~ SOLN
30.0000 mg | SUBCUTANEOUS | Status: DC
  Administered 2017-12-08 – 2017-12-09 (×2): 30 mg via SUBCUTANEOUS
  Filled 2017-12-07 (×2): qty 0.3

## 2017-12-07 MED ORDER — ONDANSETRON HCL 4 MG/2ML IJ SOLN
INTRAMUSCULAR | Status: DC | PRN
  Administered 2017-12-07: 4 mg via INTRAVENOUS

## 2017-12-07 MED ORDER — 0.9 % SODIUM CHLORIDE (POUR BTL) OPTIME
TOPICAL | Status: DC | PRN
  Administered 2017-12-07: 1000 mL

## 2017-12-07 MED ORDER — BUPIVACAINE HCL (PF) 0.25 % IJ SOLN
INTRAMUSCULAR | Status: AC
  Filled 2017-12-07: qty 30

## 2017-12-07 MED ORDER — BUPIVACAINE HCL 0.25 % IJ SOLN
INTRAMUSCULAR | Status: DC | PRN
  Administered 2017-12-07: 10 mL

## 2017-12-07 SURGICAL SUPPLY — 67 items
ALCOHOL 70% 16 OZ (MISCELLANEOUS) ×1 IMPLANT
BANDAGE ACE 3X5.8 VEL STRL LF (GAUZE/BANDAGES/DRESSINGS) ×1 IMPLANT
BLADE SURG 10 STRL SS (BLADE) ×1 IMPLANT
BNDG CMPR 9X4 STRL LF SNTH (GAUZE/BANDAGES/DRESSINGS) ×1
BNDG COHESIVE 1X5 TAN STRL LF (GAUZE/BANDAGES/DRESSINGS) IMPLANT
BNDG COHESIVE 4X5 TAN STRL (GAUZE/BANDAGES/DRESSINGS) ×1 IMPLANT
BNDG COHESIVE 6X5 TAN STRL LF (GAUZE/BANDAGES/DRESSINGS) ×2 IMPLANT
BNDG CONFORM 2 STRL LF (GAUZE/BANDAGES/DRESSINGS) ×1 IMPLANT
BNDG CONFORM 3 STRL LF (GAUZE/BANDAGES/DRESSINGS) IMPLANT
BNDG ESMARK 4X9 LF (GAUZE/BANDAGES/DRESSINGS) ×1 IMPLANT
BNDG GAUZE ELAST 4 BULKY (GAUZE/BANDAGES/DRESSINGS) ×1 IMPLANT
BNDG GAUZE STRTCH 6 (GAUZE/BANDAGES/DRESSINGS) ×3 IMPLANT
CORDS BIPOLAR (ELECTRODE) IMPLANT
COVER SURGICAL LIGHT HANDLE (MISCELLANEOUS) ×2 IMPLANT
CUFF TOURNIQUET SINGLE 24IN (TOURNIQUET CUFF) IMPLANT
CUFF TOURNIQUET SINGLE 34IN LL (TOURNIQUET CUFF) ×2 IMPLANT
CUFF TOURNIQUET SINGLE 44IN (TOURNIQUET CUFF) IMPLANT
DRAPE EXTREMITY BILATERAL (DRAPES) IMPLANT
DRAPE IMP U-DRAPE 54X76 (DRAPES) IMPLANT
DRAPE INCISE IOBAN 66X45 STRL (DRAPES) ×4 IMPLANT
DRAPE SURG 17X23 STRL (DRAPES) IMPLANT
DRAPE U-SHAPE 47X51 STRL (DRAPES) ×2 IMPLANT
DURAPREP 26ML APPLICATOR (WOUND CARE) ×1 IMPLANT
ELECT CAUTERY BLADE 6.4 (BLADE) ×1 IMPLANT
ELECT REM PT RETURN 9FT ADLT (ELECTROSURGICAL)
ELECTRODE REM PT RTRN 9FT ADLT (ELECTROSURGICAL) IMPLANT
FACESHIELD WRAPAROUND (MASK) IMPLANT
FACESHIELD WRAPAROUND OR TEAM (MASK) IMPLANT
GAUZE SPONGE 4X4 12PLY STRL (GAUZE/BANDAGES/DRESSINGS) ×3 IMPLANT
GAUZE XEROFORM 1X8 LF (GAUZE/BANDAGES/DRESSINGS) ×2 IMPLANT
GAUZE XEROFORM 5X9 LF (GAUZE/BANDAGES/DRESSINGS) ×1 IMPLANT
GLOVE BIO SURGEON STRL SZ7.5 (GLOVE) ×2 IMPLANT
GLOVE BIOGEL PI IND STRL 8 (GLOVE) ×1 IMPLANT
GLOVE BIOGEL PI INDICATOR 8 (GLOVE) ×1
GOWN STRL REUS W/ TWL LRG LVL3 (GOWN DISPOSABLE) ×2 IMPLANT
GOWN STRL REUS W/ TWL XL LVL3 (GOWN DISPOSABLE) ×1 IMPLANT
GOWN STRL REUS W/TWL LRG LVL3 (GOWN DISPOSABLE) ×4
GOWN STRL REUS W/TWL XL LVL3 (GOWN DISPOSABLE) ×2
HANDPIECE INTERPULSE COAX TIP (DISPOSABLE)
KIT BASIN OR (CUSTOM PROCEDURE TRAY) ×2 IMPLANT
KIT TURNOVER KIT B (KITS) ×2 IMPLANT
MANIFOLD NEPTUNE II (INSTRUMENTS) ×2 IMPLANT
NS IRRIG 1000ML POUR BTL (IV SOLUTION) ×3 IMPLANT
PACK ORTHO EXTREMITY (CUSTOM PROCEDURE TRAY) ×2 IMPLANT
PAD ABD 8X10 STRL (GAUZE/BANDAGES/DRESSINGS) ×1 IMPLANT
PAD ARMBOARD 7.5X6 YLW CONV (MISCELLANEOUS) ×4 IMPLANT
PADDING CAST ABS 4INX4YD NS (CAST SUPPLIES)
PADDING CAST ABS COTTON 4X4 ST (CAST SUPPLIES) ×2 IMPLANT
PADDING CAST COTTON 6X4 STRL (CAST SUPPLIES) ×1 IMPLANT
SET HNDPC FAN SPRY TIP SCT (DISPOSABLE) IMPLANT
SPONGE LAP 18X18 X RAY DECT (DISPOSABLE) ×2 IMPLANT
STOCKINETTE IMPERVIOUS 9X36 MD (GAUZE/BANDAGES/DRESSINGS) ×1 IMPLANT
SUT ETHILON 2 0 FS 18 (SUTURE) IMPLANT
SUT ETHILON 2 0 PSLX (SUTURE) IMPLANT
SUT ETHILON 3 0 PS 1 (SUTURE) ×1 IMPLANT
SUT VIC AB 2-0 CT1 36 (SUTURE) IMPLANT
SUT VIC AB 2-0 FS1 27 (SUTURE) IMPLANT
SWAB CULTURE ESWAB REG 1ML (MISCELLANEOUS) IMPLANT
SYR CONTROL 10ML LL (SYRINGE) ×1 IMPLANT
TOWEL OR 17X24 6PK STRL BLUE (TOWEL DISPOSABLE) ×2 IMPLANT
TOWEL OR 17X26 10 PK STRL BLUE (TOWEL DISPOSABLE) ×2 IMPLANT
TUBE CONNECTING 12X1/4 (SUCTIONS) ×2 IMPLANT
TUBE FEEDING ENTERAL 5FR 16IN (TUBING) IMPLANT
TUBING CYSTO DISP (UROLOGICAL SUPPLIES) ×1 IMPLANT
UNDERPAD 30X30 (UNDERPADS AND DIAPERS) ×3 IMPLANT
WATER STERILE IRR 1000ML POUR (IV SOLUTION) ×1 IMPLANT
YANKAUER SUCT BULB TIP NO VENT (SUCTIONS) ×1 IMPLANT

## 2017-12-07 NOTE — Progress Notes (Signed)
Patient states that she would like her son or daughter to read the consent form before she signs it. It has been prepare and was placed in her patient drawer.

## 2017-12-07 NOTE — Progress Notes (Addendum)
Pharmacy Antibiotic Note  [name] is a 84 y.o. female admitted on 12/05/2017 with cellulitis.  Pharmacy has been consulted for cefazolin dosing.  WBC WNL. Afebrile. L foot abscess 8/18 showing MSSA - pan sensitive. Blood cx 8/18 NGTD. Was on clindamycin on day #3 - s/p I&D on 8/20. Scr 0.7 (CrCl 42 mL/min - closer to 30 mL/min adjusting for age).   Plan: Start cefazolin 1 g IV every 8 hours  Monitor for renal fx, cx results, clinical pic, and LOT  Height: 4\' 11"  (149.9 cm) Weight: 141 lb 15.6 oz (64.4 kg)(in bed) IBW/kg (Calculated) : 43.2  Temp (24hrs), Avg:97.8 F (36.6 C), Min:97.5 F (36.4 C), Max:98.1 F (36.7 C)  Recent Labs  Lab 12/05/17 1404 12/06/17 0403  WBC 7.1 6.1  CREATININE 0.92 0.70    Estimated Creatinine Clearance: 42 mL/min (by C-G formula based on SCr of 0.7 mg/dL).    No Known Allergies  Antimicrobials this admission: Clindamycin 8/18>>8/20 Cefazolin 8/20 >>   Dose adjustments this admission: N/A  Microbiology results: 8/18 BCx: NGTD 8/18 L Foot Abscess Cx: MSSA  8/20 MRSA PCR: neg  Thank you for allowing pharmacy to be a part of this patient's care.  Doylene Canard, PharmD Clinical Pharmacist  Pager: [phone] Phone: [phone] 12/07/2017 2:34 PM

## 2017-12-07 NOTE — H&P (Signed)
H&P update  The surgical history has been reviewed and remains accurate without interval change.  The patient was re-examined and patient's physiologic condition has not changed significantly in the last 30 days. The condition still exists that makes this procedure necessary. The treatment plan remains the same, without new options for care.  No new pharmacological allergies or types of therapy has been initiated that would change the plan or the appropriateness of the plan.  The patient and/or family understand the potential benefits and risks.  Susan Alford P. Stann Mainland, MD 12/07/2017 11:23 AM

## 2017-12-07 NOTE — Anesthesia Preprocedure Evaluation (Addendum)
Anesthesia Evaluation  Patient identified by MRN, date of birth, ID band Patient awake    Reviewed: Allergy & Precautions, NPO status , Patient's Chart, lab work & pertinent test results  Airway Mallampati: II  TM Distance: >3 FB Neck ROM: Full    Dental  (+) Dental Advisory Given, Teeth Intact   Pulmonary former smoker,    breath sounds clear to auscultation       Cardiovascular hypertension, Pt. on medications and Pt. on home beta blockers + CAD and + CABG   Rhythm:Regular Rate:Normal  Study Highlights    Nuclear stress EF: 59%.  Probable normal perfusion and soft tissue attenuation (diaphragm, bowel activity) No ischemia or significant scar  This is a low risk study.       Neuro/Psych PSYCHIATRIC DISORDERS Depression Dementia negative neurological ROS     GI/Hepatic Neg liver ROS, GERD  Medicated and Controlled,  Endo/Other  diabetes (No longer on meds)  Renal/GU negative Renal ROS  negative genitourinary   Musculoskeletal  (+) Arthritis ,   Abdominal   Peds negative pediatric ROS (+)  Hematology negative hematology ROS (+)   Anesthesia Other Findings   Reproductive/Obstetrics negative OB ROS                            Anesthesia Physical Anesthesia Plan  ASA: III  Anesthesia Plan: General   Post-op Pain Management:    Induction: Intravenous  PONV Risk Score and Plan: 4 or greater and Ondansetron, Dexamethasone and Treatment may vary due to age or medical condition  Airway Management Planned: LMA  Additional Equipment:   Intra-op Plan:   Post-operative Plan: Extubation in OR  Informed Consent: I have reviewed the patients History and Physical, chart, labs and discussed the procedure including the risks, benefits and alternatives for the proposed anesthesia with the patient or authorized representative who has indicated his/her understanding and acceptance.    Dental advisory given  Plan Discussed with: Anesthesiologist and CRNA  Anesthesia Plan Comments:        Anesthesia Quick Evaluation

## 2017-12-07 NOTE — Brief Op Note (Signed)
12/07/2017  2:07 PM  PATIENT:  [name]  84 y.o. female  PRE-OPERATIVE DIAGNOSIS:  foreign body left foot  POST-OPERATIVE DIAGNOSIS:  foreign body left foot  PROCEDURE:  Procedure(s): IRRIGATION AND DEBRIDEMENT EXTREMITY WITH FOREIGN BODY REMOVAL (Left)  SURGEON:  Surgeon(s) and Role:    * Nicholes Stairs, MD - Primary  PHYSICIAN ASSISTANT:   ASSISTANTS: none   ANESTHESIA:   general  EBL:  5 cc  BLOOD ADMINISTERED:none  DRAINS: none   LOCAL MEDICATIONS USED:  MARCAINE     SPECIMEN:  No Specimen  DISPOSITION OF SPECIMEN:  N/A  COUNTS:  YES  TOURNIQUET:  * Missing tourniquet times found for documented tourniquets in log: [id number] *  DICTATION: .Note written in EPIC  PLAN OF CARE: Admit to inpatient   PATIENT DISPOSITION:  PACU - hemodynamically stable.   Delay start of Pharmacological VTE agent (>24hrs) due to surgical blood loss or risk of bleeding: not applicable

## 2017-12-07 NOTE — Anesthesia Postprocedure Evaluation (Signed)
Anesthesia Post Note  Patient: [name]  Procedure(s) Performed: IRRIGATION AND DEBRIDEMENT EXTREMITY WITH FOREIGN BODY REMOVAL (Left Foot)     Patient location during evaluation: PACU Anesthesia Type: General Level of consciousness: awake and alert Pain management: pain level controlled Vital Signs Assessment: post-procedure vital signs reviewed and stable Respiratory status: spontaneous breathing, nonlabored ventilation and respiratory function stable Cardiovascular status: blood pressure returned to baseline and stable Postop Assessment: no apparent nausea or vomiting Anesthetic complications: no    Last Vitals:  Vitals:   12/07/17 1430 12/07/17 1445  BP:    Pulse: 71 69  Resp: 19 14  Temp:    SpO2: 100% 99%    Last Pain:  Vitals:   12/07/17 1445  TempSrc:   PainSc: 4                  Audry Pili

## 2017-12-07 NOTE — Transfer of Care (Signed)
Immediate Anesthesia Transfer of Care Note  Patient: [name]  Procedure(s) Performed: IRRIGATION AND DEBRIDEMENT EXTREMITY WITH FOREIGN BODY REMOVAL (Left Foot)  Patient Location: PACU  Anesthesia Type:General  Level of Consciousness: awake, alert , oriented and patient cooperative  Airway & Oxygen Therapy: Patient Spontanous Breathing and Patient connected to nasal cannula oxygen  Post-op Assessment: Report given to RN and Post -op Vital signs reviewed and stable  Post vital signs: Reviewed and stable  Last Vitals:  Vitals Value Taken Time  BP    Temp    Pulse    Resp    SpO2      Last Pain:  Vitals:   12/07/17 1100  TempSrc:   PainSc: 5       Patients Stated Pain Goal: 2 (86/38/17 7116)  Complications: No apparent anesthesia complications

## 2017-12-07 NOTE — Progress Notes (Signed)
Orthopedic Tech Progress Note Patient Details:  [name] January 03, 1931 [id number]  Ortho Devices Type of Ortho Device: Postop shoe/boot       [name] 12/07/2017, 2:14 PM

## 2017-12-07 NOTE — Progress Notes (Signed)
@IPLOG @        PROGRESS NOTE                                                                                                                                                                                                             Patient Demographics:    [name], is a 84 y.o. female, DOB - August 15, 1930, ASN:[id number]  Admit date - 12/05/2017   Admitting Physician Ankit Arsenio Loader, MD  Outpatient Primary MD for the patient is Binnie Rail, MD  LOS - 2  Chief Complaint  Patient presents with  . Cellulitis       Brief Narrative  [name] is a 84 y.o. female with medical history significant of past medical history of CAD status post CABG in 2009, hyperlipidemia, hypertension, diabetes mellitus type 2-no longer any medications, peripheral neuropathy came to the hospital for evaluation of left lower extremity swelling and cellulitis.  Patient states about 10 days ago she stepped on a toothpick but over the course of 2 days the area became more swollen and erythematous.  She went to an urgent care where she was started on doxycycline and advised to follow-up with primary care physician.  She follow with her primary care physician on previous Thursday and was immediately sent to podiatry given the amount of swelling.  Podiatrist placed on Augmentin and scheduled her for an x-ray which showed small foreign object.  She is scheduled for surgery tomorrow morning on 12/06/2017 at an outpatient surgical center but due to the worsening of the swelling and pain she came to the ER, here she was diagnosed with abscess along with foreign body retention at the base of her left foot first toe plantar aspect and admitted to the hospital.   Subjective:    Syliva Overman today has, No headache, No chest pain, No abdominal pain - No Nausea, No new weakness tingling or numbness, No Cough - SOB.  Continues to have mild left foot pain.   Assessment  & Plan :     1.  Left foot plantar aspect  abscess with retained foreign body which is a toothpick.  Case discussed with orthopedic surgeon Dr. Stann Mainland, change antibiotics to IV cefazolin on 12/07/2017 based on wound cultures are negative MRSA PCR in the nares, per orthopedics going for incision and drainage on 12/07/2017 today, thereafter initiate PT and prepare for discharge likely tomorrow day after, small chance she might require SNF.  2.  Hypertension.  Continue combination of Norvasc, Coreg with ACE inhibitor.  Monitor and adjust as needed.  3.  Dyslipidemia.  On statin continue.  4.  CAD status post CABG in 2009.  No acute issues, stable baseline EKG.  Continue combination of aspirin, statin and Coreg for secondary prevention.  5. Early cognitive dysfunction. Continue home Aricept and Namenda.    Diet :  Diet Order            Diet NPO time specified Except for: Sips with Meds  Diet effective midnight                Family Communication  : None present  Code Status : Full  Disposition Plan  : Home in 1 to 2 days after I&D of the left foot, may require SNF if unable to bear weight properly post surgery.  Consults  : Orthopedic surgery Dr. Stann Mainland  Procedures  : Incision and drainage in the ER of the left foot, due for OR and removal of foreign body later today  DVT Prophylaxis  :  Lovenox    Lab Results  Component Value Date   PLT 214 12/06/2017    Inpatient Medications  Scheduled Meds: . [MAR Hold] amLODipine  5 mg Oral BID  . [MAR Hold] aspirin EC  81 mg Oral Daily  . [MAR Hold] atorvastatin  40 mg Oral Daily  . [MAR Hold] carvedilol  6.25 mg Oral BID  . [MAR Hold] donepezil  10 mg Oral Daily  . [MAR Hold] DULoxetine  30 mg Oral Daily  . [MAR Hold] enoxaparin (LOVENOX) injection  40 mg Subcutaneous Q24H  . [MAR Hold] gabapentin  300 mg Oral QHS  . [MAR Hold] hydrochlorothiazide  12.5 mg Oral Daily  . [MAR Hold] insulin aspart  0-9 Units Subcutaneous TID WC  . [MAR Hold] meloxicam  7.5 mg Oral Daily  .  [MAR Hold] memantine  10 mg Oral BID  . [MAR Hold] mupirocin ointment  1 application Nasal BID  . [MAR Hold] pantoprazole  40 mg Oral Daily  . povidone-iodine  2 application Topical Once  . [MAR Hold] ramipril  10 mg Oral BID   Continuous Infusions: .  ceFAZolin (ANCEF) IV    . [MAR Hold] clindamycin (CLEOCIN) IV 600 mg (12/07/17 0123)  . lactated ringers 50 mL/hr at 12/07/17 0121  . lactated ringers     PRN Meds:.[MAR Hold] acetaminophen **OR** [MAR Hold] acetaminophen, [MAR Hold] bisacodyl, [MAR Hold] HYDROcodone-acetaminophen, [MAR Hold] ondansetron **OR** [MAR Hold] ondansetron (ZOFRAN) IV, [MAR Hold] senna-docusate, [MAR Hold] traMADol  Antibiotics  :    Anti-infectives (From admission, onward)   Start     Dose/Rate Route Frequency Ordered Stop   12/07/17 0800  ceFAZolin (ANCEF) IVPB 2g/100 mL premix     2 g 200 mL/hr over 30 Minutes Intravenous On call to O.R. 12/07/17 0104 12/08/17 0559   12/06/17 0200  [MAR Hold]  clindamycin (CLEOCIN) IVPB 600 mg     (MAR Hold since Tue 12/07/2017 at 1225. Reason: Transfer to a Procedural area.)   600 mg 100 mL/hr over 30 Minutes Intravenous Every 8 hours 12/05/17 2258     12/05/17 1600  clindamycin (CLEOCIN) IVPB 900 mg     900 mg 100 mL/hr over 30 Minutes Intravenous  Once 12/05/17 1551 12/05/17 1832         Objective:   Vitals:   12/06/17 1248 12/06/17 2104 12/07/17 0123 12/07/17 0439  BP: (!) 117/58 (!) 129/57  (!) 135/53  Pulse: (!) 56 (!) 57  (!) 57  Resp:  14  14  Temp: 98.6 F (37 C) (!) 97.5 F (36.4 C)  98.1 F (36.7 C)  TempSrc: Oral Oral  Oral  SpO2: 99% 100%  100%  Weight:  61.7 kg 64.4 kg   Height:  4\' 11"  (1.499 m)      Wt Readings from Last 3 Encounters:  12/07/17 64.4 kg  08/19/17 61.7 kg  07/14/17 62.1 kg     Intake/Output Summary (Last 24 hours) at 12/07/2017 1227 Last data filed at 12/07/2017 0500 [place] per 24 hour  Intake 2613.69 ml  Output -  Net 2613.69 ml     Physical Exam  Awake Alert,  Oriented X 3, No new F.N deficits, Normal affect [place].AT,PERRAL Supple Neck,No JVD, No cervical lymphadenopathy appriciated.  Symmetrical Chest wall movement, Good air movement bilaterally, CTAB RRR,No Gallops,Rubs or new Murmurs, No Parasternal Heave +ve B.Sounds, Abd Soft, No tenderness, No organomegaly appriciated, No rebound - guarding or rigidity. No Cyanosis, Clubbing or edema, left foot first toe base on the plantar aspect has swelling and fluctuance    Data Review:    CBC Recent Labs  Lab 12/05/17 1404 12/06/17 0403  WBC 7.1 6.1  HGB 11.6* 11.2*  HCT 37.2 34.3*  PLT 229 214  MCV 97.9 95.8  MCH 30.5 31.3  MCHC 31.2 32.7  RDW 12.1 12.1    Chemistries  Recent Labs  Lab 12/05/17 1404 12/06/17 0403  NA 138 138  K 4.7 4.1  CL 105 104  CO2 25 27  GLUCOSE 125* 100*  BUN 15 12  CREATININE 0.92 0.70  CALCIUM 9.1 8.8*  AST  --  18  ALT  --  14  ALKPHOS  --  66  BILITOT  --  0.6   ------------------------------------------------------------------------------------------------------------------ No results for input(s): CHOL, HDL, LDLCALC, TRIG, CHOLHDL, LDLDIRECT in the last 72 hours.  Lab Results  Component Value Date   HGBA1C 5.9 08/19/2017   ------------------------------------------------------------------------------------------------------------------ No results for input(s): TSH, T4TOTAL, T3FREE, THYROIDAB in the last 72 hours.  Invalid input(s): FREET3 ------------------------------------------------------------------------------------------------------------------ No results for input(s): VITAMINB12, FOLATE, FERRITIN, TIBC, IRON, RETICCTPCT in the last 72 hours.  Coagulation profile No results for input(s): INR, PROTIME in the last 168 hours.  No results for input(s): DDIMER in the last 72 hours.  Cardiac Enzymes No results for input(s): CKMB, TROPONINI, MYOGLOBIN in the last 168 hours.  Invalid input(s):  CK ------------------------------------------------------------------------------------------------------------------ No results found for: BNP  Micro Results Recent Results (from the past 240 hour(s))  Wound or Superficial Culture     Status: None (Preliminary result)   Collection Time: 12/05/17  5:16 PM  Result Value Ref Range Status   Specimen Description ABSCESS  Final   Special Requests LEFT FOOT  Final   Gram Stain   Final    RARE WBC PRESENT, PREDOMINANTLY PMN RARE GRAM POSITIVE COCCI    Culture FEW STAPHYLOCOCCUS AUREUS  Final   Report Status PENDING  Incomplete   Organism ID, Bacteria STAPHYLOCOCCUS AUREUS  Final      Susceptibility   Staphylococcus aureus - MIC*    CIPROFLOXACIN <=0.5 SENSITIVE Sensitive     ERYTHROMYCIN <=0.25 SENSITIVE Sensitive     GENTAMICIN <=0.5 SENSITIVE Sensitive     OXACILLIN <=0.25 SENSITIVE Sensitive     TETRACYCLINE <=1 SENSITIVE Sensitive     VANCOMYCIN <=0.5 SENSITIVE Sensitive     TRIMETH/SULFA <=10 SENSITIVE Sensitive     CLINDAMYCIN <=0.25 SENSITIVE Sensitive     RIFAMPIN <=0.5 SENSITIVE Sensitive     Inducible Clindamycin  Value in next row Sensitive      NEGATIVEPerformed at [facility], [place] [address], [place], [place] [address]    * FEW STAPHYLOCOCCUS AUREUS  Culture, blood (Routine X 2) w Reflex to ID Panel     Status: None (Preliminary result)   Collection Time: 12/05/17  8:22 PM  Result Value Ref Range Status   Specimen Description BLOOD LEFT ARM  Final   Special Requests   Final    BOTTLES DRAWN AEROBIC AND ANAEROBIC Blood Culture adequate volume   Culture   Final    NO GROWTH < 24 HOURS Performed at [facility], [place] [address], [place], [place] [address]    Report Status PENDING  Incomplete  Culture, blood (Routine X 2) w Reflex to ID Panel     Status: None (Preliminary result)   Collection Time: 12/05/17  8:25 PM  Result Value Ref Range Status   Specimen Description BLOOD RIGHT HAND  Final   Special  Requests   Final    BOTTLES DRAWN AEROBIC AND ANAEROBIC Blood Culture adequate volume   Culture   Final    NO GROWTH < 24 HOURS Performed at [facility], [place] [address], [place], [place] [address]    Report Status PENDING  Incomplete  MRSA PCR Screening     Status: None   Collection Time: 12/06/17  6:34 AM  Result Value Ref Range Status   MRSA by PCR NEGATIVE NEGATIVE Final    Comment:        The GeneXpert MRSA Assay (FDA approved for NASAL specimens only), is one component of a comprehensive MRSA colonization surveillance program. It is not intended to diagnose MRSA infection nor to guide or monitor treatment for MRSA infections. Performed at [facility], [place] [address] [place], [place] [address]   Surgical PCR screen     Status: None   Collection Time: 12/07/17  1:10 AM  Result Value Ref Range Status   MRSA, PCR NEGATIVE NEGATIVE Final   Staphylococcus aureus NEGATIVE NEGATIVE Final    Comment: (NOTE) The Xpert SA Assay (FDA approved for NASAL specimens in patients 84 years of age and older), is one component of a comprehensive surveillance program. It is not intended to diagnose infection nor to guide or monitor treatment. Performed at [facility], [place] [address], [place], [place] [address]     Radiology Reports Mr Foot Left Wo Contrast  Result Date: 12/06/2017 CLINICAL DATA:  Cellulitis of the left foot. Foreign body in the soft tissues. EXAM: MRI OF THE LEFT FOOT WITHOUT CONTRAST TECHNIQUE: Multiplanar, multisequence MR imaging of the left foot was performed. No intravenous contrast was administered. COMPARISON:  Radiographs dated 12/05/2017 and 11/28/2017 FINDINGS: Soft tissues There is a 16 x 2 mm foreign body in the soft tissues of the plantar aspect of left foot adjacent to the proximal phalanx of the great toe. This foreign body is best seen on images 7 and 8 of series 7. There is an adjacent soft tissue abscess extending dorsally in the web  space between the first and second toes. This abscess measures approximately 3.1 x 2.6 x 1.3 cm. There is nonspecific subcutaneous edema on the dorsum of the foot at the medial aspect of the forefoot as well as some edema in the plantar aspect of the forefoot, all consistent with cellulitis. Bones/Joint/Cartilage No osteomyelitis or other acute bone abnormality. Slight arthritic changes of the first MTP joint with hallux valgus  deformity and bunion formation on the head of the first metatarsal. Ligaments Normal. Muscles and Tendons Normal. IMPRESSION: 1. Splinter like foreign body in the soft tissues of the ball of the foot surrounded by an abscess which extends dorsally in the webspace between the first and second toes as described above. This foreign body is consistent with the patient's report of stepping on a toothpick. 2. Soft tissue edema of the forefoot consistent with cellulitis. 3. No evidence of osteomyelitis or septic joint. Electronically Signed   By: Lorriane Shire M.D.   On: 12/06/2017 09:25   Dg Foot Complete Left  Result Date: 12/05/2017 CLINICAL DATA:  Swelling and erythema. EXAM: LEFT FOOT - COMPLETE 3+ VIEW COMPARISON:  11/28/2017 FINDINGS: There is no evidence of fracture or dislocation. Osteoarthritic changes at the first metatarsophalangeal joint. Vascular calcifications noted. Forefoot soft tissue swelling. IMPRESSION: No acute osseous abnormality identified about the left foot. Forefoot soft tissue swelling. Electronically Signed   By: Fidela Salisbury M.D.   On: 12/05/2017 21:03    Time Spent in minutes  30   Lala Lund M.D on 12/07/2017 at 12:27 PM  To page go to [email or web address] - password [email or web address]

## 2017-12-07 NOTE — Op Note (Addendum)
Date of Surgery: 12/07/2017  INDICATIONS: Ms. Molzahn is a 84 y.o.-year-old female with a left foot retained foreign body.  Briefly, approximately 6 days prior to presentation to the emergency department Ms. Witthuhn remembers stepping on a toothpick.  This was retained in the plantar aspect of the left foot along the hallux.  She was referred to a podiatrist for possible retrieval of this foreign body.  Unfortunately, over the weekend and specifically on Sunday evening she developed increasing swelling and redness of the foot as well as some fevers and night sweats.  She presented to our emergency department with the above complaints.  She had a local I&D performed in the emergency department and I was consulted for definitive management.  We did obtain an MRI to further localize the retained foreign body and she did indeed have a retained 2 cm toothpick or wooden material in the plantar aspect of the hallux at the MTP junction.  She did also have an associated peri-abscess around that foreign body.;  The patient did consent to the procedure after discussion of the risks and benefits.  PREOPERATIVE DIAGNOSIS: 1.  Left foot abscess 2.  Foreign body left foot  POSTOPERATIVE DIAGNOSIS: Same.  PROCEDURE: 1.  Irrigation and excisional debridement of left foot including skin, subtenons tissue, and muscle. 2.  Removal of foreign body left foot.  2 cm retained toothpick.  SURGEON: Geralynn Rile, M.D.  ASSIST: None.  ANESTHESIA:  general  IV FLUIDS AND URINE: See anesthesia.  ESTIMATED BLOOD LOSS: 5 mL.  IMPLANTS: None  DRAINS: None  Ankle tourniquet: 30 minutes  COMPLICATIONS: None.  DESCRIPTION OF PROCEDURE: The patient was brought to the operating room and placed supine on the operating table.  The patient had been signed prior to the procedure and this was documented. The patient had the anesthesia placed by the anesthesiologist.  A time-out was performed to confirm that this was the correct  patient, site, side and location. The patient did receive antibiotics prior to the incision and was re-dosed during the procedure as needed at indicated intervals.  A tourniquet at the ankle was placed.  The patient had the operative extremity prepped and draped in the standard surgical fashion.      Utilizing the preoperative MRI to triangulate the position of the foreign body we made a longitudinal plantar incision along the proximal phalanx base.  This was a 3 cm incision.  There was noted to be macerated tissue in the first interspace that communicated dorsally with the stab incision made in the emergency department for local abscess drainage.  We did encounter just scant dishwater fluid in the subcutaneous space along the retained foreign body.  My surgical incision did extend up to that area.  We then dissected down through skin, subtenons tissue and muscle to the level of the flexor hallucis longus and base of the proximal phalanx of the hallux.  We then encountered the foreign body.  We excised this foreign body with a forcep.  On the back table this measured 21 mm and was consistent with the retained toothpick.  We then turned our attention to the excisional debridement.  Utilizing Metzenbaum scissors as well as a knife we excised any nonviable skin, subtenons tissue and muscle of the plantar aspect of the foot.  We then irrigated the wound copiously with 3 L of normal saline.  We next infiltrated the base of the hallux with 10 cc of quarter percent plain Marcaine for a local field block and  postoperative pain control.  We then moved to close the surgical incision with 3-0 nylon interrupted sutures.  A standard sterile foot bandage was applied in a noncompressive fashion.  Postop shoe was applied to the foot.  There were no noted intraoperative comp occasions and the patient tolerated the procedure well.  All counts were correct x2.  She was transferred to PACU in stable  condition.  POSTOPERATIVE PLAN:  Ms. Craw will return to the medicine service postoperatively where she may begin heel only weightbearing to the left lower extremity.  She should maintain her postoperative bandages until her follow-up appointment in 2 weeks.  Antibiotics may continue while here she would likely be appropriate for transition to oral antibiotics and a 7-day postoperative course of that.  These can be based off of the cultures obtained in the emergency department.

## 2017-12-07 NOTE — Discharge Instructions (Signed)
Orthopedic discharge instructions:  -Okay for heel only weightbearing to the left foot and postop shoe. -Elevate your left lower extremity with "toes above nose." -Maintain your postoperative bandages clean and dry at all times.  These will be removed at your first postoperative appointment in 2 weeks.

## 2017-12-07 NOTE — Progress Notes (Signed)
PT Cancellation Note  Patient Details Name: CATELYNN SPARGER MRN: [id number] DOB: May 14, 1930   Cancelled Treatment:    Reason Eval/Treat Not Completed: Patient at procedure or test/unavailable. Noted pt about to go to OR per RN. Will initiate PT eval after as pt is able.   Einar Crow 12/07/2017, 11:14 AM  Einar Crow, SPT  Student Physical Therapist Acute Rehab [phone]

## 2017-12-08 ENCOUNTER — Encounter (HOSPITAL_COMMUNITY): Payer: Self-pay | Admitting: Orthopedic Surgery

## 2017-12-08 DIAGNOSIS — L03119 Cellulitis of unspecified part of limb: Secondary | ICD-10-CM

## 2017-12-08 LAB — CBC
HCT: 37.1 % (ref 36.0–46.0)
Hemoglobin: 12.1 g/dL (ref 12.0–15.0)
MCH: 31.2 pg (ref 26.0–34.0)
MCHC: 32.6 g/dL (ref 30.0–36.0)
MCV: 95.6 fL (ref 78.0–100.0)
Platelets: 255 10*3/uL (ref 150–400)
RBC: 3.88 MIL/uL (ref 3.87–5.11)
RDW: 12.1 % (ref 11.5–15.5)
WBC: 7.7 10*3/uL (ref 4.0–10.5)

## 2017-12-08 LAB — BASIC METABOLIC PANEL
Anion gap: 8 (ref 5–15)
BUN: 7 mg/dL — ABNORMAL LOW (ref 8–23)
CO2: 27 mmol/L (ref 22–32)
Calcium: 9 mg/dL (ref 8.9–10.3)
Chloride: 100 mmol/L (ref 98–111)
Creatinine, Ser: 0.68 mg/dL (ref 0.44–1.00)
GFR calc Af Amer: >60 mL/min (ref >60)
GFR calc non Af Amer: >60 mL/min (ref >60)
Glucose, Bld: 110 mg/dL — ABNORMAL HIGH (ref 70–99)
Potassium: 3.9 mmol/L (ref 3.5–5.1)
Sodium: 135 mmol/L (ref 135–145)

## 2017-12-08 LAB — GLUCOSE, CAPILLARY
Glucose-Capillary: 162 mg/dL — ABNORMAL HIGH (ref 70–99)
Glucose-Capillary: 102 mg/dL — ABNORMAL HIGH (ref 70–99)
Glucose-Capillary: 172 mg/dL — ABNORMAL HIGH (ref 70–99)
Glucose-Capillary: 80 mg/dL (ref 70–99)

## 2017-12-08 LAB — AEROBIC CULTURE W GRAM STAIN (SUPERFICIAL SPECIMEN)

## 2017-12-08 LAB — AEROBIC CULTURE  (SUPERFICIAL SPECIMEN)

## 2017-12-08 NOTE — Progress Notes (Signed)
   Subjective:  Patient reports pain as mild.  Currently doing very well.  She denies any breakthrough pain.  Denies fevers, night sweats or chills.  Denies chest pain or shortness of breath.  She feels ready to go home tomorrow.  Objective:   VITALS:   Vitals:   12/07/17 2313 12/08/17 0404 12/08/17 1100 12/08/17 1156  BP: (!) 158/65 134/62 (!) 120/58 (!) 120/58  Pulse: 63 68 (!) 57 (!) 56  Resp:  16 16 16   Temp:  98.1 F (36.7 C) 98 F (36.7 C) 98 F (36.7 C)  TempSrc:  Oral Oral Oral  SpO2: 99% 99% 99% 100%  Weight:      Height:        Sensation intact distally Dorsiflexion/Plantar flexion intact Incision: dressing C/D/I No cellulitis present -Postop shoe in place.  Capillary refill is less than 2 seconds.  No pain with passive motion of the great toe.  Lab Results  Component Value Date   WBC 7.7 12/08/2017   HGB 12.1 12/08/2017   HCT 37.1 12/08/2017   MCV 95.6 12/08/2017   PLT 255 12/08/2017   BMET    Component Value Date/Time   NA 135 12/08/2017 0438   K 3.9 12/08/2017 0438   CL 100 12/08/2017 0438   CO2 27 12/08/2017 0438   GLUCOSE 110 (H) 12/08/2017 0438   BUN 7 (L) 12/08/2017 0438   CREATININE 0.68 12/08/2017 0438   CALCIUM 9.0 12/08/2017 0438   GFRNONAA >60 12/08/2017 0438   GFRAA >60 12/08/2017 0438     Assessment/Plan: 1 Day Post-Op   Active Problems:   Cellulitis of foot   Up with therapy -Okay for heel weightbearing as tolerated on the left lower extremity.  Recommend elevation and ankle range of motion as tolerated. -Maintain postoperative bandages until follow-up appointment. -I do think that she will be appropriate for discharge as soon as cleared from the medical side of things.  I would recommend to stay 1 week course of oral antibiotics post discharge. -She should follow-up with me in my office in 2 to 3 weeks post discharge.   Nicholes Stairs 12/08/2017, 12:41 PM   Geralynn Rile, MD [phone]

## 2017-12-08 NOTE — Progress Notes (Signed)
PROGRESS NOTE    [name]  ELF:[id number] DOB: 07-14-1930 DOA: 12/05/2017 PCP: Binnie Rail, MD    Brief Narrative: 84 year old with past medical history relevant for dementia, hypertension, hyperlipidemia, chronic pain/peripheral neuropathy, coronary artery disease status post CABG in 2010 admitted with foreign body and infection on plantar foot status post incision and debridement and removal of foreign body on 12/07/2017.   Assessment & Plan:   Active Problems:   Cellulitis of foot   #) Infected foreign body left foot status post incision, debridement, removal: The patient is doing well -Continue postop shoe -PT consult - Continue cefazolin started 12/07/2017 - Culture from superficial wound on 12/05/2017 growing MSSA - Blood culture from 12/05/2017 no growth to date  #) Coronary artery disease status post CABG: -Continue aspirin 81 mg - Continue atorvastatin 40 mg - Continue ramipril 10 mg twice daily - Continue carvedilol 6.25 mg twice daily  Hypertension/hyperlipidemia: -Continue HCTZ 12.5 mg daily -Continue beta-blocker and ACE inhibitor -Continue amlodipine 5 mg twice daily -Continue atorvastatin per above  #) Pain/psych: -Continue gabapentin 300 mg nightly -Continue duloxetine 30 mg daily  #) Dementia: -Continue donepezil 10 mg daily -Continue memantine 10 mg twice daily  Fluids: Tolerating p.o. Electrodes: Monitor and supplement Nutrition: Heart healthy diet  Prophylaxis: Enoxaparin  Disposition: Pending PT evaluation  Full code    Consultants:   Orthopedic surgery  Procedures:   12/07/2017 debridement of left foot abscess and foreign body removal  Antimicrobials:   IV cefazolin started 12/07/2017   Subjective: She reports that her pain is under control.  She has not been able to ambulate.  She denies any nausea, vomiting, chest pain, cough, congestion, rhinorrhea.  Objective: Vitals:   12/07/17 1445 12/07/17 1950 12/07/17 2313  12/08/17 0404  BP:  (!) 159/61 (!) 158/65 134/62  Pulse: 69 66 63 68  Resp: 14   16  Temp:  97.6 F (36.4 C)  98.1 F (36.7 C)  TempSrc:  Oral  Oral  SpO2: 99% 100% 99% 99%  Weight:      Height:        Intake/Output Summary (Last 24 hours) at 12/08/2017 1152 Last data filed at 12/08/2017 0900 Gross per 24 hour  Intake 817.72 ml  Output 20 ml  Net 797.72 ml   Filed Weights   12/06/17 2104 12/07/17 0123  Weight: 61.7 kg 64.4 kg    Examination:  General exam: Appears calm and comfortable  Respiratory system: Clear to auscultation. Respiratory effort normal. Cardiovascular system: Regular rate and rhythm, no murmurs Gastrointestinal system: Abdomen is nondistended, soft and nontender. No organomegaly or masses felt. Normal bowel sounds heard. Central nervous system: Alert and oriented. No focal neurological deficits. Extremities: No lower extremity edema Skin: Right foot is wrapped, postop shoe Psychiatry: Judgement and insight appear normal. Mood & affect appropriate.     Data Reviewed: I have personally reviewed following labs and imaging studies  CBC: Recent Labs  Lab 12/05/17 1404 12/06/17 0403 12/08/17 0438  WBC 7.1 6.1 7.7  HGB 11.6* 11.2* 12.1  HCT 37.2 34.3* 37.1  MCV 97.9 95.8 95.6  PLT 229 214 585   Basic Metabolic Panel: Recent Labs  Lab 12/05/17 1404 12/06/17 0403 12/08/17 0438  NA 138 138 135  K 4.7 4.1 3.9  CL 105 104 100  CO2 25 27 27   GLUCOSE 125* 100* 110*  BUN 15 12 7*  CREATININE 0.92 0.70 0.68  CALCIUM 9.1 8.8* 9.0   GFR: Estimated Creatinine Clearance: 42 mL/min (  by C-G formula based on SCr of 0.68 mg/dL). Liver Function Tests: Recent Labs  Lab 12/06/17 0403  AST 18  ALT 14  ALKPHOS 66  BILITOT 0.6  PROT 5.9*  ALBUMIN 2.8*   No results for input(s): LIPASE, AMYLASE in the last 168 hours. No results for input(s): AMMONIA in the last 168 hours. Coagulation Profile: No results for input(s): INR, PROTIME in the last 168  hours. Cardiac Enzymes: No results for input(s): CKTOTAL, CKMB, CKMBINDEX, TROPONINI in the last 168 hours. BNP (last 3 results) No results for input(s): PROBNP in the last 8760 hours. HbA1C: No results for input(s): HGBA1C in the last 72 hours. CBG: Recent Labs  Lab 12/07/17 1128 12/07/17 1412 12/07/17 1556 12/07/17 2131 12/08/17 0806  GLUCAP 109* 151* 129* 127* 162*   Lipid Profile: No results for input(s): CHOL, HDL, LDLCALC, TRIG, CHOLHDL, LDLDIRECT in the last 72 hours. Thyroid Function Tests: No results for input(s): TSH, T4TOTAL, FREET4, T3FREE, THYROIDAB in the last 72 hours. Anemia Panel: No results for input(s): VITAMINB12, FOLATE, FERRITIN, TIBC, IRON, RETICCTPCT in the last 72 hours. Sepsis Labs: No results for input(s): PROCALCITON, LATICACIDVEN in the last 168 hours.  Recent Results (from the past 240 hour(s))  Wound or Superficial Culture     Status: None (Preliminary result)   Collection Time: 12/05/17  5:16 PM  Result Value Ref Range Status   Specimen Description ABSCESS  Final   Special Requests LEFT FOOT  Final   Gram Stain   Final    RARE WBC PRESENT, PREDOMINANTLY PMN RARE GRAM POSITIVE COCCI    Culture FEW STAPHYLOCOCCUS AUREUS  Final   Report Status PENDING  Incomplete   Organism ID, Bacteria STAPHYLOCOCCUS AUREUS  Final      Susceptibility   Staphylococcus aureus - MIC*    CIPROFLOXACIN <=0.5 SENSITIVE Sensitive     ERYTHROMYCIN <=0.25 SENSITIVE Sensitive     GENTAMICIN <=0.5 SENSITIVE Sensitive     OXACILLIN <=0.25 SENSITIVE Sensitive     TETRACYCLINE <=1 SENSITIVE Sensitive     VANCOMYCIN <=0.5 SENSITIVE Sensitive     TRIMETH/SULFA <=10 SENSITIVE Sensitive     CLINDAMYCIN <=0.25 SENSITIVE Sensitive     RIFAMPIN <=0.5 SENSITIVE Sensitive     Inducible Clindamycin Value in next row Sensitive      NEGATIVEPerformed at [facility] [address] [place], [place] [address]    * FEW STAPHYLOCOCCUS AUREUS  Culture, blood (Routine X 2) w  Reflex to ID Panel     Status: None (Preliminary result)   Collection Time: 12/05/17  8:22 PM  Result Value Ref Range Status   Specimen Description BLOOD LEFT ARM  Final   Special Requests   Final    BOTTLES DRAWN AEROBIC AND ANAEROBIC Blood Culture adequate volume   Culture   Final    NO GROWTH 2 DAYS Performed at [facility], [address] [place], [place] [address]    Report Status PENDING  Incomplete  Culture, blood (Routine X 2) w Reflex to ID Panel     Status: None (Preliminary result)   Collection Time: 12/05/17  8:25 PM  Result Value Ref Range Status   Specimen Description BLOOD RIGHT HAND  Final   Special Requests   Final    BOTTLES DRAWN AEROBIC AND ANAEROBIC Blood Culture adequate volume   Culture   Final    NO GROWTH 2 DAYS Performed at [facility], [place] [address], [place], [place] [address]  Report Status PENDING  Incomplete  MRSA PCR Screening     Status: None   Collection Time: 12/06/17  6:34 AM  Result Value Ref Range Status   MRSA by PCR NEGATIVE NEGATIVE Final    Comment:        The GeneXpert MRSA Assay (FDA approved for NASAL specimens only), is one component of a comprehensive MRSA colonization surveillance program. It is not intended to diagnose MRSA infection nor to guide or monitor treatment for MRSA infections. Performed at [facility], [place] [address] [place], [place] [address]   Surgical PCR screen     Status: None   Collection Time: 12/07/17  1:10 AM  Result Value Ref Range Status   MRSA, PCR NEGATIVE NEGATIVE Final   Staphylococcus aureus NEGATIVE NEGATIVE Final    Comment: (NOTE) The Xpert SA Assay (FDA approved for NASAL specimens in patients 39 years of age and older), is one component of a comprehensive surveillance program. It is not intended to diagnose infection nor to guide or monitor treatment. Performed at [facility], [place] [address] [place], [place] [address]          Radiology  Studies: No results found.      Scheduled Meds: . amLODipine  5 mg Oral BID  . aspirin EC  81 mg Oral Daily  . atorvastatin  40 mg Oral Daily  . carvedilol  6.25 mg Oral BID  . docusate sodium  100 mg Oral BID  . donepezil  10 mg Oral Daily  . DULoxetine  30 mg Oral Daily  . enoxaparin (LOVENOX) injection  30 mg Subcutaneous Q24H  . gabapentin  300 mg Oral QHS  . hydrochlorothiazide  12.5 mg Oral Daily  . insulin aspart  0-9 Units Subcutaneous TID WC  . meloxicam  7.5 mg Oral Daily  . memantine  10 mg Oral BID  . mupirocin ointment  1 application Nasal BID  . pantoprazole  40 mg Oral Daily  . ramipril  10 mg Oral BID   Continuous Infusions: .  ceFAZolin (ANCEF) IV 1 g (12/08/17 0653)  . lactated ringers 10 mL/hr at 12/07/17 1236     LOS: 3 days    Time spent: [place], MD [facility]   If 7PM-7AM, please contact night-coverage [email or web address] Password [facility] 12/08/2017, 11:52 AM

## 2017-12-08 NOTE — Care Management Important Message (Signed)
Important Message  Patient Details  Name: [name] MRN: [id number] Date of Birth: [birth year]   Medicare Important Message Given:  Yes    Layton Tappan [address] 12/08/2017, 1:36 PM

## 2017-12-08 NOTE — Evaluation (Signed)
Physical Therapy Evaluation Patient Details Name: [name] MRN: [id number] DOB: May 05, 1930 Today's Date: 12/08/2017   History of Present Illness  Pt is an 84 y/o female with PMH including dementia, hypertension, hyperlipidemia, chronic pain/peripheral neuropathy, coronary artery disease status post CABG in 2010 admitted with foreign body and infection on plantar foot status post incision and debridement and removal of foreign body on 12/07/2017.    Clinical Impression  Pt presented supine in bed with HOB elevated, awake and willing to participate in therapy session. Prior to admission, pt reported that she was independent with all functional mobility. Pt currently requires supervision for bed mobility, min guard for transfers and min guard to ambulate a short distance within her room with RW. Pt would continue to benefit from skilled physical therapy services at this time while admitted and after d/c to address the below listed limitations in order to improve overall safety and independence with functional mobility.     Follow Up Recommendations Home health PT    Equipment Recommendations  None recommended by PT    Recommendations for Other Services       Precautions / Restrictions Precautions Precautions: Fall Restrictions Weight Bearing Restrictions: Yes Other Position/Activity Restrictions: WBAT L LE      Mobility  Bed Mobility Overal bed mobility: Needs Assistance Bed Mobility: Supine to Sit     Supine to sit: Supervision     General bed mobility comments: supervision for safety  Transfers Overall transfer level: Needs assistance Equipment used: Rolling walker (2 wheeled) Transfers: Sit to/from Stand Sit to Stand: Min guard         General transfer comment: min guard for safety, cueing for safe hand placement  Ambulation/Gait Ambulation/Gait assistance: Min guard Gait Distance (Feet): 20 Feet(20' x2) Assistive device: Rolling walker (2 wheeled) Gait  Pattern/deviations: Step-through pattern;Step-to pattern;Decreased step length - right;Decreased step length - left;Decreased stance time - left;Decreased stride length;Decreased weight shift to left Gait velocity: decreased Gait velocity interpretation: <1.31 ft/sec, indicative of household ambulator General Gait Details: pt steady with RW, min guard for safety; mildly antalgic gait with decreased WB'ing through L LE  Stairs            Wheelchair Mobility    Modified Rankin (Stroke Patients Only)       Balance Overall balance assessment: Needs assistance Sitting-balance support: Feet supported Sitting balance-Leahy Scale: Good     Standing balance support: During functional activity;Bilateral upper extremity supported Standing balance-Leahy Scale: Poor                               Pertinent Vitals/Pain Pain Assessment: No/denies pain    Home Living Family/patient expects to be discharged to:: Private residence Living Arrangements: Alone Available Help at Discharge: Family;Friend(s);Available PRN/intermittently Type of Home: House Home Access: Stairs to enter Entrance Stairs-Rails: Psychiatric nurse of Steps: 3 Home Layout: One level Home Equipment: Walker - 2 wheels;Cane - single point      Prior Function Level of Independence: Independent               Hand Dominance        Extremity/Trunk Assessment   Upper Extremity Assessment Upper Extremity Assessment: Overall WFL for tasks assessed    Lower Extremity Assessment Lower Extremity Assessment: Overall WFL for tasks assessed;LLE deficits/detail LLE Deficits / Details: pt with decreased WB'ing on L LE with functional mobility, using bilateral UEs on RW to off weight pressure  Communication   Communication: No difficulties  Cognition Arousal/Alertness: Awake/alert Behavior During Therapy: WFL for tasks assessed/performed Overall Cognitive Status: Within  Functional Limits for tasks assessed                                        General Comments      Exercises     Assessment/Plan    PT Assessment Patient needs continued PT services  PT Problem List Decreased strength;Decreased activity tolerance;Decreased range of motion;Decreased balance;Decreased mobility;Decreased coordination;Decreased knowledge of use of DME;Decreased safety awareness;Decreased knowledge of precautions;Pain       PT Treatment Interventions Gait training;DME instruction;Stair training;Functional mobility training;Therapeutic exercise;Therapeutic activities;Balance training;Neuromuscular re-education;Patient/family education    PT Goals (Current goals can be found in the Care Plan section)  Acute Rehab PT Goals Patient Stated Goal: return home ASAP PT Goal Formulation: With patient Time For Goal Achievement: 12/22/17 Potential to Achieve Goals: Good    Frequency Min 3X/week   Barriers to discharge        Co-evaluation               AM-PAC PT "6 Clicks" Daily Activity  Outcome Measure Difficulty turning over in bed (including adjusting bedclothes, sheets and blankets)?: None Difficulty moving from lying on back to sitting on the side of the bed? : None Difficulty sitting down on and standing up from a chair with arms (e.g., wheelchair, bedside commode, etc,.)?: Unable Help needed moving to and from a bed to chair (including a wheelchair)?: A Little Help needed walking in hospital room?: A Little Help needed climbing 3-5 steps with a railing? : A Little 6 Click Score: 18    End of Session Equipment Utilized During Treatment: Gait belt Activity Tolerance: Patient tolerated treatment well Patient left: in chair;with call bell/phone within reach Nurse Communication: Mobility status PT Visit Diagnosis: Other abnormalities of gait and mobility (R26.89)    Time: 9628-3662 PT Time Calculation (min) (ACUTE ONLY): 23  min   Charges:   PT Evaluation $PT Eval Moderate Complexity: 1 Mod PT Treatments $Gait Training: 8-22 mins        [place], [place], DPT [place] 12/08/2017, 4:05 PM

## 2017-12-09 LAB — GLUCOSE, CAPILLARY
Glucose-Capillary: 125 mg/dL — ABNORMAL HIGH (ref 70–99)
Glucose-Capillary: 81 mg/dL (ref 70–99)
Glucose-Capillary: 91 mg/dL (ref 70–99)

## 2017-12-09 MED ORDER — TRAMADOL HCL 50 MG PO TABS: 50.0000 mg | ORAL_TABLET | Freq: Three times a day (TID) | ORAL | 0 refills | Status: AC | PRN

## 2017-12-09 MED ORDER — SULFAMETHOXAZOLE-TRIMETHOPRIM 800-160 MG PO TABS: 1.0000 | ORAL_TABLET | Freq: Two times a day (BID) | ORAL | 0 refills | Status: AC

## 2017-12-09 NOTE — Consult Note (Signed)
[facility] CM Primary Care Navigator  12/09/2017  [name] [birth year] [id number]   Met with patient and daughter ([place]) at the bedside to identify possible discharge needs. Daughter reports that patient had cellulitis and infected foreign body to left foot. (underwent irrigation, debridement and removal of foreign body to left foot)  Patient endorses Dr. Billey Gosling with [facility] as the primary care provider.   Daughter shared using [place] on [employer] to obtain medications without any problem.   Patient's daughter manages her medications at home using "pill box" system filled weekly.   Patient has been driving prior to admission/ surgery but daughter can provide transportation to her doctors' appointments after discharge. Daughter also mentioned using [employer] to assist with transportation if she is not available to bring patient to her appointments.  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 Daughter states that she may also use [employer] if patient will need further assistance at home after discharge.  According to daughter, anticipated discharge plan is home with home health services. Daughter mentioned that she will be staying with patient upon discharge to assist with her needs.  Patient and daughter voiced understanding to call primary care provider's office for a post discharge follow-up appointment within 1- 2 weeks or sooner if needs arise. Patient letter (with PCP's contact number) was provided as a reminder.   Discussed with patientregarding [facility] CM services available for health managementandresourcesat homebutboth denied any needs or concerns at this point. Daughter states being capable of managing patient's health conditions and patient is knowledgeable of ways to  manage chronic health issues so far. Patient and daughter verbalizedunderstandingof needto seekreferral from primary care provider to [facility] care management ifdeemed necessary and appropriatefor anyservicesin thenear future.  [facility] care management information was provided for futureneeds that patient may have.  Patienthowever,verbally agreedand optedforEMMIcalls tofollow-up with her recoveryat home.   Referral made for [facility] General calls after discharge.   For additional questions please contact:  Edwena Felty A. Jay Haskew, BSN, RN-BC [facility] PRIMARY CARE Navigator Cell: [phone]

## 2017-12-09 NOTE — Care Management Note (Signed)
Case Management Note  Patient Details  Name: [name] MRN: [id number] Date of Birth: 12-06-1930  Subjective/Objective:   Pt is s/p I&D                  Action/Plan:  PTA independent from home alone.  Pt has PCP and denied barriers to paying for medications.  Pt interested in [facility] - deferred agency choice to daughter.  Daughter chose [facility] - agency contacted and referral accepted.  No other CM needs identified at time of discharge   Expected Discharge Date:  12/09/17               Expected Discharge Plan:  [place]  In-House Referral:     Discharge planning Services  CM Consult  Post Acute Care Choice:    Choice offered to:     DME Arranged:    DME Agency:     HH Arranged:  PT [place]:  [place]  Status of Service:  Completed, signed off  If discussed at [place] of Stay Meetings, dates discussed:    Additional Comments:  [name], RN 12/09/2017, 2:08 PM

## 2017-12-09 NOTE — Discharge Summary (Signed)
Physician Discharge Summary  [name] IRW:[id number] DOB: 1931-01-26 DOA: 12/05/2017  PCP: Binnie Rail, MD  Admit date: 12/05/2017 Discharge date: 12/09/2017  Admitted From: Home Disposition:  Home  Recommendations for Outpatient Follow-up:  1. Follow up with PCP in 1-2 weeks 2. Please obtain BMP/CBC in one week 3. Please follow-up with orthopedic surgery and 2 weeks and keep your postoperative bandages clean and dry until then.  Home Health: Yes, HHPT Equipment/Devices: Postop shoe and walker  Discharge Condition: Stable CODE STATUS: Full Diet recommendation: Heart Healthy  Brief/Interim Summary:  #) Infected foreign body of left foot status post incision and drainage: Patient was admitted with cellulitis and infected foreign body of foot.  Foreign body was not seen on x-ray but was noted on MRI.  Patient was seen by orthopedic surgery and had debridement and removal of foreign body in 12/07/2017.  This grew out MSSA.  Blood cultures from admission were negative.  Patient was maintained on IV cefazolin and discharged on Bactrim.  She had been on oral doxycycline prior to her surgery.  She was given a postop shoe.  Physical therapy evaluated the patient recommended home health PT.  She can be heel weightbearing as tolerated.  #) Coronary artery disease status post CABG: Patient was continued on aspirin, atorvastatin, ramipril, carvedilol.  #) Hypertension/hyperlipidemia: Patient was continued on HCTZ, beta-blocker, ACE inhibitor, amlodipine, statin.  #) Pain/psych: Patient was continued on gabapentin and duloxetine.  #) Dementia: Patient was continued on home donepezil and memantine.  Discharge Diagnoses:  Active Problems:   Cellulitis of foot    Discharge Instructions  Discharge Instructions    Call MD for:  difficulty breathing, headache or visual disturbances   Complete by:  As directed    Call MD for:  hives   Complete by:  As directed    Call MD for:   persistant dizziness or light-headedness   Complete by:  As directed    Call MD for:  persistant nausea and vomiting   Complete by:  As directed    Call MD for:  redness, tenderness, or signs of infection (pain, swelling, redness, odor or green/yellow discharge around incision site)   Complete by:  As directed    Call MD for:  severe uncontrolled pain   Complete by:  As directed    Call MD for:  temperature >100.4   Complete by:  As directed    Diet - low sodium heart healthy   Complete by:  As directed    Increase activity slowly   Complete by:  As directed      Allergies as of 12/09/2017   No Known Allergies     Medication List    TAKE these medications   amLODipine 5 MG tablet Commonly known as:  NORVASC Take 1 tablet (5 mg total) by mouth 2 (two) times daily.   aspirin EC 81 MG tablet Take 81 mg by mouth daily.   atorvastatin 40 MG tablet Commonly known as:  LIPITOR Take 1 tablet (40 mg total) by mouth daily.   carvedilol 6.25 MG tablet Commonly known as:  COREG TAKE ONE TABLET BY MOUTH TWICE A DAY WITH A MEAL What changed:    how much to take  how to take this  when to take this  additional instructions   cetirizine 10 MG tablet Commonly known as:  ZYRTEC TAKE ONE TABLET BY MOUTH DAILY What changed:  when to take this   diclofenac sodium 1 % Gel Commonly  known as:  VOLTAREN Apply 2 g topically 4 (four) times daily as needed (pain).   donepezil 10 MG tablet Commonly known as:  ARICEPT Take 1 tablet daily What changed:    how much to take  how to take this  when to take this  additional instructions   DULoxetine 30 MG capsule Commonly known as:  CYMBALTA Take 1 capsule (30 mg total) by mouth daily.   gabapentin 300 MG capsule Commonly known as:  NEURONTIN TAKE ONE CAPSULE BY MOUTH TWICE DAILY. What changed:  when to take this   hydrochlorothiazide 12.5 MG tablet Commonly known as:  HYDRODIURIL Take 1 tablet (12.5 mg total) by mouth  daily.   meloxicam 7.5 MG tablet Commonly known as:  MOBIC Take 1 tablet (7.5 mg total) by mouth daily.   memantine 10 MG tablet Commonly known as:  NAMENDA Take 1 tablet (10 mg total) by mouth 2 (two) times daily.   omeprazole 40 MG capsule Commonly known as:  PRILOSEC TAKE 1 CAPSULE (40 MG TOTAL) BY MOUTH DAILY ON AN EMPTY STOMACH What changed:    how much to take  how to take this  when to take this  additional instructions   ramipril 10 MG capsule Commonly known as:  ALTACE Take 1 capsule (10 mg total) by mouth 2 (two) times daily.   sulfamethoxazole-trimethoprim 800-160 MG tablet Commonly known as:  BACTRIM DS,SEPTRA DS Take 1 tablet by mouth 2 (two) times daily for 8 days.   traMADol 50 MG tablet Commonly known as:  ULTRAM Take 1 tablet (50 mg total) by mouth every 8 (eight) hours as needed for up to 7 days. What changed:  reasons to take this      Follow-up Information    Nicholes Stairs, MD Follow up in 3 week(s).   Specialty:  Orthopedic Surgery Why:  For suture removal, For wound re-check Contact information: [address] [place] [address] [phone]          No Known Allergies  Consultations:  Orthopedic surgery, [facility]   Procedures/Studies: Mr Foot Left Wo Contrast  Result Date: 12/06/2017 CLINICAL DATA:  Cellulitis of the left foot. Foreign body in the soft tissues. EXAM: MRI OF THE LEFT FOOT WITHOUT CONTRAST TECHNIQUE: Multiplanar, multisequence MR imaging of the left foot was performed. No intravenous contrast was administered. COMPARISON:  Radiographs dated 12/05/2017 and 11/28/2017 FINDINGS: Soft tissues There is a 16 x 2 mm foreign body in the soft tissues of the plantar aspect of left foot adjacent to the proximal phalanx of the great toe. This foreign body is best seen on images 7 and 8 of series 7. There is an adjacent soft tissue abscess extending dorsally in the web space between the first and  second toes. This abscess measures approximately 3.1 x 2.6 x 1.3 cm. There is nonspecific subcutaneous edema on the dorsum of the foot at the medial aspect of the forefoot as well as some edema in the plantar aspect of the forefoot, all consistent with cellulitis. Bones/Joint/Cartilage No osteomyelitis or other acute bone abnormality. Slight arthritic changes of the first MTP joint with hallux valgus deformity and bunion formation on the head of the first metatarsal. Ligaments Normal. Muscles and Tendons Normal. IMPRESSION: 1. Splinter like foreign body in the soft tissues of the ball of the foot surrounded by an abscess which extends dorsally in the webspace between the first and second toes as described above. This foreign body is consistent with the  patient's report of stepping on a toothpick. 2. Soft tissue edema of the forefoot consistent with cellulitis. 3. No evidence of osteomyelitis or septic joint. Electronically Signed   By: Lorriane Shire M.D.   On: 12/06/2017 09:25   Dg Foot Complete Left  Result Date: 12/05/2017 CLINICAL DATA:  Swelling and erythema. EXAM: LEFT FOOT - COMPLETE 3+ VIEW COMPARISON:  11/28/2017 FINDINGS: There is no evidence of fracture or dislocation. Osteoarthritic changes at the first metatarsophalangeal joint. Vascular calcifications noted. Forefoot soft tissue swelling. IMPRESSION: No acute osseous abnormality identified about the left foot. Forefoot soft tissue swelling. Electronically Signed   By: Fidela Salisbury M.D.   On: 12/05/2017 21:03     Subjective:   Discharge Exam: Vitals:   12/08/17 2023 12/09/17 0439  BP: (!) 139/58 137/64  Pulse: (!) 58 74  Resp: 14 14  Temp: 97.8 F (36.6 C) 99.4 F (37.4 C)  SpO2: 95% 100%   Vitals:   12/08/17 1100 12/08/17 1156 12/08/17 2023 12/09/17 0439  BP: (!) 120/58 (!) 120/58 (!) 139/58 137/64  Pulse: (!) 57 (!) 56 (!) 58 74  Resp: 16 16 14 14   Temp: 98 F (36.7 C) 98 F (36.7 C) 97.8 F (36.6 C) 99.4 F (37.4  C)  TempSrc: Oral Oral Oral Oral  SpO2: 99% 100% 95% 100%  Weight:      Height:        General exam: Appears calm and comfortable  Respiratory system: Clear to auscultation. Respiratory effort normal. Cardiovascular system: Regular rate and rhythm, no murmurs Gastrointestinal system: Abdomen is nondistended, soft and nontender. No organomegaly or masses felt. Normal bowel sounds heard. Central nervous system: Alert and oriented. No focal neurological deficits. Extremities: No lower extremity edema Skin: Right foot is wrapped, postop shoe Psychiatry: Judgement and insight appear normal. Mood & affect appropriate.    The results of significant diagnostics from this hospitalization (including imaging, microbiology, ancillary and laboratory) are listed below for reference.     Microbiology: Recent Results (from the past 240 hour(s))  Wound or Superficial Culture     Status: None   Collection Time: 12/05/17  5:16 PM  Result Value Ref Range Status   Specimen Description ABSCESS  Final   Special Requests LEFT FOOT  Final   Gram Stain   Final    RARE WBC PRESENT, PREDOMINANTLY PMN RARE GRAM POSITIVE COCCI Performed at [facility], [address] [place], [place] [address]    Culture FEW STAPHYLOCOCCUS AUREUS  Final   Report Status 12/08/2017 FINAL  Final   Organism ID, Bacteria STAPHYLOCOCCUS AUREUS  Final      Susceptibility   Staphylococcus aureus - MIC*    CIPROFLOXACIN <=0.5 SENSITIVE Sensitive     ERYTHROMYCIN <=0.25 SENSITIVE Sensitive     GENTAMICIN <=0.5 SENSITIVE Sensitive     OXACILLIN <=0.25 SENSITIVE Sensitive     TETRACYCLINE <=1 SENSITIVE Sensitive     VANCOMYCIN <=0.5 SENSITIVE Sensitive     TRIMETH/SULFA <=10 SENSITIVE Sensitive     CLINDAMYCIN <=0.25 SENSITIVE Sensitive     RIFAMPIN <=0.5 SENSITIVE Sensitive     Inducible Clindamycin NEGATIVE Sensitive     * FEW STAPHYLOCOCCUS AUREUS  Culture, blood (Routine X 2) w Reflex to ID Panel     Status: None  (Preliminary result)   Collection Time: 12/05/17  8:22 PM  Result Value Ref Range Status   Specimen Description BLOOD LEFT ARM  Final   Special Requests   Final    BOTTLES  DRAWN AEROBIC AND ANAEROBIC Blood Culture adequate volume   Culture   Final    NO GROWTH 4 DAYS Performed at [facility], [place] [address], [place], [place] [address]    Report Status PENDING  Incomplete  Culture, blood (Routine X 2) w Reflex to ID Panel     Status: None (Preliminary result)   Collection Time: 12/05/17  8:25 PM  Result Value Ref Range Status   Specimen Description BLOOD RIGHT HAND  Final   Special Requests   Final    BOTTLES DRAWN AEROBIC AND ANAEROBIC Blood Culture adequate volume   Culture   Final    NO GROWTH 4 DAYS Performed at [facility], [place] [address] [place], [place] [address]    Report Status PENDING  Incomplete  MRSA PCR Screening     Status: None   Collection Time: 12/06/17  6:34 AM  Result Value Ref Range Status   MRSA by PCR NEGATIVE NEGATIVE Final    Comment:        The GeneXpert MRSA Assay (FDA approved for NASAL specimens only), is one component of a comprehensive MRSA colonization surveillance program. It is not intended to diagnose MRSA infection nor to guide or monitor treatment for MRSA infections. Performed at [facility], [place] [address] [place], [place] [address]   Surgical PCR screen     Status: None   Collection Time: 12/07/17  1:10 AM  Result Value Ref Range Status   MRSA, PCR NEGATIVE NEGATIVE Final   Staphylococcus aureus NEGATIVE NEGATIVE Final    Comment: (NOTE) The Xpert SA Assay (FDA approved for NASAL specimens in patients 53 years of age and older), is one component of a comprehensive surveillance program. It is not intended to diagnose infection nor to guide or monitor treatment. Performed at [facility], [place] [address] [place],  [address]      Labs: BNP (last 3 results) No results for input(s): BNP in the  last 8760 hours. Basic Metabolic Panel: Recent Labs  Lab 12/05/17 1404 12/06/17 0403 12/08/17 0438  NA 138 138 135  K 4.7 4.1 3.9  CL 105 104 100  CO2 25 27 27   GLUCOSE 125* 100* 110*  BUN 15 12 7*  CREATININE 0.92 0.70 0.68  CALCIUM 9.1 8.8* 9.0   Liver Function Tests: Recent Labs  Lab 12/06/17 0403  AST 18  ALT 14  ALKPHOS 66  BILITOT 0.6  PROT 5.9*  ALBUMIN 2.8*   No results for input(s): LIPASE, AMYLASE in the last 168 hours. No results for input(s): AMMONIA in the last 168 hours. CBC: Recent Labs  Lab 12/05/17 1404 12/06/17 0403 12/08/17 0438  WBC 7.1 6.1 7.7  HGB 11.6* 11.2* 12.1  HCT 37.2 34.3* 37.1  MCV 97.9 95.8 95.6  PLT 229 214 255   Cardiac Enzymes: No results for input(s): CKTOTAL, CKMB, CKMBINDEX, TROPONINI in the last 168 hours. BNP: Invalid input(s): POCBNP CBG: Recent Labs  Lab 12/08/17 0806 12/08/17 1151 12/08/17 1558 12/08/17 2120 12/09/17 0747  GLUCAP 162* 102* 172* 80 125*   D-Dimer No results for input(s): DDIMER in the last 72 hours. Hgb A1c No results for input(s): HGBA1C in the last 72 hours. Lipid Profile No results for input(s): CHOL, HDL, LDLCALC, TRIG, CHOLHDL, LDLDIRECT in the last 72 hours. Thyroid function studies No results for input(s): TSH, T4TOTAL, T3FREE, THYROIDAB in the last 72 hours.  Invalid input(s): FREET3 Anemia work up No results for input(s): VITAMINB12,  FOLATE, FERRITIN, TIBC, IRON, RETICCTPCT in the last 72 hours. Urinalysis    Component Value Date/Time   COLORURINE YELLOW 04/17/2014 1254   APPEARANCEUR CLEAR 04/17/2014 1254   LABSPEC 1.012 04/17/2014 1254   PHURINE 7.5 04/17/2014 1254   GLUCOSEU NEGATIVE 04/17/2014 1254   GLUCOSEU NEGATIVE 06/09/2013 1718   HGBUR NEGATIVE 04/17/2014 1254   BILIRUBINUR NEGATIVE 04/17/2014 1254   KETONESUR NEGATIVE 04/17/2014 1254   PROTEINUR 30 (A) 04/17/2014 1254   UROBILINOGEN 0.2 04/17/2014 1254   NITRITE NEGATIVE 04/17/2014 1254   LEUKOCYTESUR TRACE  (A) 04/17/2014 1254   Sepsis Labs Invalid input(s): PROCALCITONIN,  WBC,  LACTICIDVEN Microbiology Recent Results (from the past 240 hour(s))  Wound or Superficial Culture     Status: None   Collection Time: 12/05/17  5:16 PM  Result Value Ref Range Status   Specimen Description ABSCESS  Final   Special Requests LEFT FOOT  Final   Gram Stain   Final    RARE WBC PRESENT, PREDOMINANTLY PMN RARE GRAM POSITIVE COCCI Performed at [facility], [place] [address] [place], [place] [address]    Culture FEW STAPHYLOCOCCUS AUREUS  Final   Report Status 12/08/2017 FINAL  Final   Organism ID, Bacteria STAPHYLOCOCCUS AUREUS  Final      Susceptibility   Staphylococcus aureus - MIC*    CIPROFLOXACIN <=0.5 SENSITIVE Sensitive     ERYTHROMYCIN <=0.25 SENSITIVE Sensitive     GENTAMICIN <=0.5 SENSITIVE Sensitive     OXACILLIN <=0.25 SENSITIVE Sensitive     TETRACYCLINE <=1 SENSITIVE Sensitive     VANCOMYCIN <=0.5 SENSITIVE Sensitive     TRIMETH/SULFA <=10 SENSITIVE Sensitive     CLINDAMYCIN <=0.25 SENSITIVE Sensitive     RIFAMPIN <=0.5 SENSITIVE Sensitive     Inducible Clindamycin NEGATIVE Sensitive     * FEW STAPHYLOCOCCUS AUREUS  Culture, blood (Routine X 2) w Reflex to ID Panel     Status: None (Preliminary result)   Collection Time: 12/05/17  8:22 PM  Result Value Ref Range Status   Specimen Description BLOOD LEFT ARM  Final   Special Requests   Final    BOTTLES DRAWN AEROBIC AND ANAEROBIC Blood Culture adequate volume   Culture   Final    NO GROWTH 4 DAYS Performed at [facility], [address], [place], [place] [address]    Report Status PENDING  Incomplete  Culture, blood (Routine X 2) w Reflex to ID Panel     Status: None (Preliminary result)   Collection Time: 12/05/17  8:25 PM  Result Value Ref Range Status   Specimen Description BLOOD RIGHT HAND  Final   Special Requests   Final    BOTTLES DRAWN AEROBIC AND ANAEROBIC Blood Culture adequate volume   Culture   Final     NO GROWTH 4 DAYS Performed at [facility], [place] [address] [place], [place] [address]    Report Status PENDING  Incomplete  MRSA PCR Screening     Status: None   Collection Time: 12/06/17  6:34 AM  Result Value Ref Range Status   MRSA by PCR NEGATIVE NEGATIVE Final    Comment:        The GeneXpert MRSA Assay (FDA approved for NASAL specimens only), is one component of a comprehensive MRSA colonization surveillance program. It is not intended to diagnose MRSA infection nor to guide or monitor treatment for MRSA infections. Performed at [facility], [place] [address], [place], [place] [address]  Surgical PCR screen     Status: None   Collection Time: 12/07/17  1:10 AM  Result Value Ref Range Status   MRSA, PCR NEGATIVE NEGATIVE Final   Staphylococcus aureus NEGATIVE NEGATIVE Final    Comment: (NOTE) The Xpert SA Assay (FDA approved for NASAL specimens in patients 95 years of age and older), is one component of a comprehensive surveillance program. It is not intended to diagnose infection nor to guide or monitor treatment. Performed at [facility], [place] [address], [place], [place] [address]      Time coordinating discharge: 84  SIGNED:   Cristy Folks, MD  [facility] 12/09/2017, 11:58 AM  If 7PM-7AM, please contact night-coverage [email or web address] Password [email or web address]

## 2017-12-09 NOTE — Progress Notes (Signed)
Physical Therapy Treatment Patient Details Name: [name] MRN: [id number] DOB: 06-27-1930 Today's Date: 12/09/2017    History of Present Illness Pt is an 84 y/o female with PMH including dementia, hypertension, hyperlipidemia, chronic pain/peripheral neuropathy, coronary artery disease status post CABG in 2010 admitted with foreign body and infection on plantar foot status post incision and debridement and removal of foreign body on 12/07/2017.    PT Comments    Pt making steady progress with functional mobility. Pt participated in stair training this session with no issues. Pt would continue to benefit from skilled physical therapy services at this time while admitted and after d/c to address the below listed limitations in order to improve overall safety and independence with functional mobility.    Follow Up Recommendations  Home health PT     Equipment Recommendations  None recommended by PT    Recommendations for Other Services       Precautions / Restrictions Precautions Precautions: Fall Restrictions Weight Bearing Restrictions: Yes LLE Weight Bearing: Weight bearing as tolerated    Mobility  Bed Mobility               General bed mobility comments: pt OOB in recliner chair upon arrival  Transfers Overall transfer level: Needs assistance Equipment used: Rolling walker (2 wheeled) Transfers: Sit to/from Stand Sit to Stand: Min guard         General transfer comment: min guard for safety, good technique; pt performed x2 from recliner chair  Ambulation/Gait Ambulation/Gait assistance: Min guard Gait Distance (Feet): 50 Feet Assistive device: Rolling walker (2 wheeled) Gait Pattern/deviations: Step-through pattern;Step-to pattern;Decreased step length - right;Decreased step length - left;Decreased stance time - left;Decreased stride length;Decreased weight shift to left Gait velocity: decreased Gait velocity interpretation: <1.31 ft/sec, indicative  of household ambulator General Gait Details: pt steady with RW, min guard for safety; mildly antalgic gait with decreased WB'ing through L LE   Stairs Stairs: Yes Stairs assistance: Min guard Stair Management: Two rails;Step to pattern;Forwards Number of Stairs: 2(x2 trials) General stair comments: min guard for safety, cueing for sequencing   Wheelchair Mobility    Modified Rankin (Stroke Patients Only)       Balance Overall balance assessment: Needs assistance Sitting-balance support: Feet supported Sitting balance-Leahy Scale: Good     Standing balance support: During functional activity;Bilateral upper extremity supported Standing balance-Leahy Scale: Poor                              Cognition Arousal/Alertness: Awake/alert Behavior During Therapy: WFL for tasks assessed/performed Overall Cognitive Status: Within Functional Limits for tasks assessed                                        Exercises      General Comments        Pertinent Vitals/Pain Pain Assessment: No/denies pain    Home Living                      Prior Function            PT Goals (current goals can now be found in the care plan section) Acute Rehab PT Goals PT Goal Formulation: With patient Time For Goal Achievement: 12/22/17 Potential to Achieve Goals: Good Progress towards PT goals: Progressing toward goals    Frequency  Min 3X/week      PT Plan Current plan remains appropriate    Co-evaluation              AM-PAC PT "6 Clicks" Daily Activity  Outcome Measure  Difficulty turning over in bed (including adjusting bedclothes, sheets and blankets)?: None Difficulty moving from lying on back to sitting on the side of the bed? : None Difficulty sitting down on and standing up from a chair with arms (e.g., wheelchair, bedside commode, etc,.)?: Unable Help needed moving to and from a bed to chair (including a wheelchair)?: A  Little Help needed walking in hospital room?: A Little Help needed climbing 3-5 steps with a railing? : A Little 6 Click Score: 18    End of Session Equipment Utilized During Treatment: Gait belt Activity Tolerance: Patient tolerated treatment well Patient left: in chair;with call bell/phone within reach Nurse Communication: Mobility status PT Visit Diagnosis: Other abnormalities of gait and mobility (R26.89)     Time: [id number] PT Time Calculation (min) (ACUTE ONLY): 29 min  Charges:  $Gait Training: 23-37 mins                     [place], [place], [place] 12/09/2017, 12:23 PM

## 2017-12-09 NOTE — Care Management (Signed)
Update:  CM contacted pt and was requested to contact pts daughter as she believes HH has already been arranged.  CM left voicemail from daughter requesting call back  CM gave choice of [facility] agency to pt, pt requested CM to call back

## 2017-12-10 ENCOUNTER — Telehealth: Payer: Self-pay | Admitting: *Deleted

## 2017-12-10 LAB — CULTURE, BLOOD (ROUTINE X 2)
Culture: NO GROWTH
Culture: NO GROWTH
Special Requests: ADEQUATE
Special Requests: ADEQUATE

## 2017-12-10 NOTE — Telephone Encounter (Signed)
Called pt concerning hosp appt that has been schedule for 12/24/17 pt states she was aware of appt. Her daughter helps her with her appts/ also inform pt had some additional question to ask concerning the discharge. Completed TCM call below.Johny Chess  Transition Care Management Follow-up Telephone Call   Date discharged? 12/09/17   How have you been since you were released from the hospital? Pt states she is doing fine   Do you understand why you were in the hospital? YES   Do you understand the discharge instructions? YES   Where were you discharged to? Home   Items Reviewed:  Medications reviewed: YES  Allergies reviewed: YES  Dietary changes reviewed: YES, heart healthy  Referrals reviewed: YES, pt states she have a follow-up schedule w/the orthopedic but not sure of the date   Functional Questionnaire:   Activities of Daily Living (ADLs):   She states she are independent in the following: bathing and hygiene, feeding, continence, grooming and toileting States they require assistance with the following: ambulation and dressing   Any transportation issues/concerns?: NO   Any patient concerns? NO   Confirmed importance and date/time of follow-up visits scheduled YES, appt 12/23/17  Provider Appointment booked with Dr. Quay Burow  Confirmed with patient if condition begins to worsen call PCP or go to the ER.  Patient was given the office number and encouraged to call back with question or concerns.  : YES

## 2017-12-16 ENCOUNTER — Other Ambulatory Visit: Payer: Self-pay | Admitting: Internal Medicine

## 2017-12-17 MED ORDER — MELOXICAM 7.5 MG PO TABS: 7.5000 mg | ORAL_TABLET | Freq: Every day | ORAL | 1 refills | Status: DC

## 2017-12-17 MED ORDER — DULOXETINE HCL 30 MG PO CPEP: 30.0000 mg | ORAL_CAPSULE | Freq: Every day | ORAL | 1 refills | Status: DC

## 2017-12-22 NOTE — Progress Notes (Signed)
Subjective:    Patient ID: [name], female    DOB: [birth year], 84 y.o.   MRN: [id number]  HPI The patient is here for follow up from the hospital.  She was here 8/15 for a right foot cellulitis and abscess secondary to foreign body.  She did see podiatry that day and had an x-ray that showed a small foreign body.  They did have her scheduled for surgery the following day, but her foot became more painful, swollen and red and she went to the emergency room on 8/18.  Admitted 8/18-8/22.  In the emergency room she had a bedside incision and drainage.  She had erythema from her toes up to her ankle.  The foot was swollen and extremely painful.  Orthopedics was consulted.  She was started on IV clindamycin.  She did have an MRI of the foot, which was able to visualize the foreign body.  She had debridement and removal of the  foreign body on 8/20 by orthopedic surgery.  The culture grew out MSSA.  Blood cultures were negative from admission.  She was maintained on IV cefazolin and that was eventually changed to Bactrim on discharge.  She was sent home with a postop shoe.  Physical therapy was consulted and they recommended home PT.  Orthopedics advised heel weightbearing as tolerated.  Her chronic medical problems remained stable including her coronary artery disease, hypertension, hyperlipidemia and dementia.  None of her chronic medications were changed.   She saw Dr Stann Mainland, orthopedics, two days ago.  He took the stitches out.  She will see him again on 10/1.  The patient was there with her home health aide and does not recall any the details, but thought reading was okay.  She can walk on it.  She denies pain.  Orthopedics had ordered home health to help change her bandages, but her and her daughter have not heard anything about that.  There is minimal discharge.  The patient did take off the bandage yesterday and put some toilet paper in place of bandage because she did not have any at home.   She has not looked at it since.  No fever/chills.  She denies numbness/tingling in the foot.   She has completed the antibiotics.  She is taking all of her chronic medications daily as prescribed.  Medications and allergies reviewed with patient and updated if appropriate.  Patient Active Problem List   Diagnosis Date Noted  . Cellulitis of foot 12/05/2017  . Abscess of right foot 12/02/2017  . Mild dementia 07/27/2017  . Buttock pain 10/30/2016  . Chronic RUQ pain 05/28/2016  . Right knee pain 06/14/2015  . Diabetic neuropathy ([place]) 03/21/2014  . Bunion 03/21/2014  . PVD (pulmonary valve disease) 01/23/2014  . Onychomycosis 12/20/2013  . PVD (peripheral vascular disease) ([place]) 12/19/2013  . Gout 12/13/2013  . Depression   . Spinal stenosis of lumbar region 04/24/2013  . Arthritis of shoulder region, left 09/11/2011  . Type 2 diabetes mellitus ([place]) 01/17/2009  . Anemia 10/15/2008  . CAD, NATIVE VESSEL 06/09/2008  . Hyperlipidemia 06/15/2007  . Essential hypertension 06/15/2007  . GERD 06/15/2007  . Fibromyalgia syndrome 06/15/2007  . 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 DIVERTICULOSIS, 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    Current Outpatient Medications on File Prior to Visit  Medication Sig Dispense Refill  . amLODipine (NORVASC) 5 MG tablet Take 1 tablet (5 mg total) by mouth 2 (two) times daily. 180 tablet 3  . aspirin EC 81  MG tablet Take 81 mg by mouth daily.    Marland Kitchen atorvastatin (LIPITOR) 40 MG tablet Take 1 tablet (40 mg total) by mouth daily. 90 tablet 3  . carvedilol (COREG) 6.25 MG tablet TAKE ONE TABLET BY MOUTH TWICE A DAY WITH A MEAL (Patient taking differently: Take 6.25 mg by mouth 2 (two) times daily. ) 180 tablet 3  . cetirizine (ZYRTEC) 10 MG tablet TAKE ONE TABLET BY MOUTH DAILY (Patient taking differently: Take 10 mg by mouth at bedtime. ) 90 tablet 3  . diclofenac sodium (VOLTAREN) 1 % GEL Apply 2 g topically 4 (four) times daily as needed (pain).    Marland Kitchen donepezil  (ARICEPT) 10 MG tablet Take 1 tablet daily (Patient taking differently: Take 10 mg by mouth daily. ) 90 tablet 3  . gabapentin (NEURONTIN) 300 MG capsule TAKE ONE CAPSULE BY MOUTH TWICE DAILY. (Patient taking differently: Take 300 mg by mouth at bedtime. ) 60 capsule 5  . hydrochlorothiazide (HYDRODIURIL) 12.5 MG tablet Take 1 tablet (12.5 mg total) by mouth daily. 90 tablet 3  . memantine (NAMENDA) 10 MG tablet Take 1 tablet (10 mg total) by mouth 2 (two) times daily. 180 tablet 3  . omeprazole (PRILOSEC) 40 MG capsule TAKE 1 CAPSULE (40 MG TOTAL) BY MOUTH DAILY ON AN EMPTY STOMACH (Patient taking differently: Take 40 mg by mouth daily. ) 90 capsule 3  . ramipril (ALTACE) 10 MG capsule Take 1 capsule (10 mg total) by mouth 2 (two) times daily. 180 capsule 3   No current facility-administered medications on file prior to visit.     Past Medical History:  Diagnosis Date  . CAD, NATIVE VESSEL 06/09/2008  . DIABETES MELLITUS, TYPE II   . FIBROMYALGIA   . GERD   . Gout   . HIATAL HERNIA   . HYPERLIPIDEMIA   . HYPERTENSION   . Insomnia   . OA (osteoarthritis)    severe, shoulders, hands - inflammatory, ?RA  . S/P CABG x 1   . S/P inguinal hernia repair   . TUBULOVILLOUS ADENOMA, 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   colo 04/2012 - no polyps - no further screening planned (age >76)  . UNSPECIFIED ANEMIA     Past Surgical History:  Procedure Laterality Date  . ABDOMINAL HYSTERECTOMY  1981  . APPENDECTOMY    . CATARACT EXTRACTION, BILATERAL    . CORONARY ARTERY BYPASS GRAFT  2010  . HERNIA REPAIR     INGUINAL  . I&D EXTREMITY Left 12/07/2017   Procedure: IRRIGATION AND DEBRIDEMENT EXTREMITY WITH FOREIGN BODY REMOVAL;  Surgeon: Nicholes Stairs, MD;  Location: [facility];  Service: Orthopedics;  Laterality: Left;  . JOINT REPLACEMENT    . OVARIAN CYST REMOVAL    . REPLACEMENT TOTAL KNEE BILATERAL Bilateral    Rt-1974, Lt -1989  . REVERSE SHOULDER ARTHROPLASTY  09/08/2011   Procedure: REVERSE SHOULDER  ARTHROPLASTY;  Surgeon: Nita Sells, MD;  Location: [facility];  Service: Orthopedics;  Laterality: Left;  left reverse total shoulder arthroplasty  . ROTATOR CUFF REPAIR     Left    Social History   Socioeconomic History  . Marital status: Widowed    Spouse name: Not on file  . Number of children: Not on file  . Years of education: Not on file  . Highest education level: Not on file  Occupational History  . Not on file  Social Needs  . Financial resource strain: Not on file  . Food insecurity:    Worry: Not on  file    Inability: Not on file  . Transportation needs:    Medical: Not on file    Non-medical: Not on file  Tobacco Use  . Smoking status: Former Smoker    Last attempt to quit: 04/26/1970    Years since quitting: 47.6  . Smokeless tobacco: Never Used  Substance and Sexual Activity  . Alcohol use: Yes    Alcohol/week: 1.0 standard drinks    Types: 1 Glasses of wine per week  . Drug use: No  . Sexual activity: Not on file  Lifestyle  . Physical activity:    Days per week: Not on file    Minutes per session: Not on file  . Stress: Not on file  Relationships  . Social connections:    Talks on phone: Not on file    Gets together: Not on file    Attends religious service: Not on file    Active member of club or organization: Not on file    Attends meetings of clubs or organizations: Not on file    Relationship status: Not on file  Other Topics Concern  . Not on file  Social History Narrative   Widows, lives alone. 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 Cancer Mother        Stomach  . Stomach cancer Mother 77  . Hypertension Mother   . Heart disease Father 42       MI  . Diabetes Father   . Heart attack Father   . Diabetes Daughter   . Hypertension Daughter   . Vascular Disease Sister   . Stroke Sister   . Dementia Sister   . Diabetes Sister   . Parkinsonism Sister   . Stroke Sister     . Heart disease Sister   . Heart attack Brother   . Anesthesia problems Neg Hx   . Colon cancer Neg Hx     Review of Systems  Constitutional: Negative for chills and fever.  Respiratory: Negative for shortness of breath.   Cardiovascular: Negative for chest pain, palpitations and leg swelling.  Skin: Positive for wound. Negative for color change.  Neurological: Negative for light-headedness, numbness and headaches.       Objective:   Vitals:   12/23/17 0949  BP: 122/60  Pulse: (!) 51  Resp: 16  Temp: 97.8 F (36.6 C)  SpO2: 96%   BP Readings from Last 3 Encounters:  12/23/17 122/60  12/09/17 137/64  12/02/17 120/64   Wt Readings from Last 3 Encounters:  12/23/17 137 lb (62.1 kg)  12/07/17 141 lb 15.6 oz (64.4 kg)  08/19/17 136 lb (61.7 kg)   Body mass index is 27.67 kg/m.   Physical Exam    Constitutional: Appears well-developed and well-nourished. No distress.  HENT:  Head: Normocephalic and atraumatic.  Neck: Neck supple. No tracheal deviation present. No thyromegaly present.  No cervical lymphadenopathy Cardiovascular: Normal rate, regular rhythm and normal heart sounds.   No murmur heard. No carotid bruit .  No edema Pulmonary/Chest: Effort normal and breath sounds normal. No respiratory distress. No has no wheezes. No rales.  Msk: Right foot with laceration between first toe and second toe with minimal discharge that is not pus, no fluctuance or tenderness around the wound, no active bleeding, top layer of skin has peeled around the wound and some of the skin is dry and when she pulled on it the  other day it started to bleed.  Normal sensation.  Normal ROM of toes. Skin: Skin is warm and dry. Not diaphoretic.  Psychiatric: Normal mood and affect. Behavior is normal.   Lab Results  Component Value Date   WBC 7.7 12/08/2017   HGB 12.1 12/08/2017   HCT 37.1 12/08/2017   PLT 255 12/08/2017   GLUCOSE 110 (H) 12/08/2017   CHOL 193 08/19/2017   TRIG 99.0  08/19/2017   HDL 109.10 08/19/2017   LDLDIRECT 115.3 09/30/2009   LDLCALC 64 08/19/2017   ALT 14 12/06/2017   AST 18 12/06/2017   NA 135 12/08/2017   K 3.9 12/08/2017   CL 100 12/08/2017   CREATININE 0.68 12/08/2017   BUN 7 (L) 12/08/2017   CO2 27 12/08/2017   TSH 0.94 08/19/2017   INR 0.99 11/06/2010   HGBA1C 5.9 08/19/2017   MICROALBUR 1.5 04/02/2015    MR FOOT LEFT WO CONTRAST CLINICAL DATA:  Cellulitis of the left foot. Foreign body in the soft tissues.  EXAM: MRI OF THE LEFT FOOT WITHOUT CONTRAST  TECHNIQUE: Multiplanar, multisequence MR imaging of the left foot was performed. No intravenous contrast was administered.  COMPARISON:  Radiographs dated 12/05/2017 and 11/28/2017  FINDINGS: Soft tissues  There is a 16 x 2 mm foreign body in the soft tissues of the plantar aspect of left foot adjacent to the proximal phalanx of the great toe. This foreign body is best seen on images 7 and 8 of series 7. There is an adjacent soft tissue abscess extending dorsally in the web space between the first and second toes. This abscess measures approximately 3.1 x 2.6 x 1.3 cm.  There is nonspecific subcutaneous edema on the dorsum of the foot at the medial aspect of the forefoot as well as some edema in the plantar aspect of the forefoot, all consistent with cellulitis.  Bones/Joint/Cartilage  No osteomyelitis or other acute bone abnormality. Slight arthritic changes of the first MTP joint with hallux valgus deformity and bunion formation on the head of the first metatarsal.  Ligaments  Normal.  Muscles and Tendons  Normal.  IMPRESSION: 1. Splinter like foreign body in the soft tissues of the ball of the foot surrounded by an abscess which extends dorsally in the webspace between the first and second toes as described above. This foreign body is consistent with the patient's report of stepping on a toothpick. 2. Soft tissue edema of the forefoot consistent with  cellulitis. 3. No evidence of osteomyelitis or septic joint.  Electronically Signed   By: Lorriane Shire M.D.   On: 12/06/2017 09:25    Assessment & Plan:    See Problem List for Assessment and Plan of chronic medical problems.

## 2017-12-23 ENCOUNTER — Encounter: Payer: Self-pay | Admitting: Internal Medicine

## 2017-12-23 ENCOUNTER — Ambulatory Visit (INDEPENDENT_AMBULATORY_CARE_PROVIDER_SITE_OTHER): Payer: Medicare Other | Admitting: Internal Medicine

## 2017-12-23 VITALS — BP 122/60 | HR 51 | Temp 97.8°F | Resp 16 | Ht 59.0 in | Wt 137.0 lb

## 2017-12-23 DIAGNOSIS — L03119 Cellulitis of unspecified part of limb: Secondary | ICD-10-CM

## 2017-12-23 DIAGNOSIS — L02611 Cutaneous abscess of right foot: Secondary | ICD-10-CM | POA: Diagnosis not present

## 2017-12-23 DIAGNOSIS — Z23 Encounter for immunization: Secondary | ICD-10-CM

## 2017-12-23 MED ORDER — DULOXETINE HCL 30 MG PO CPEP: 30.0000 mg | ORAL_CAPSULE | Freq: Every day | ORAL | 1 refills | Status: DC

## 2017-12-23 MED ORDER — MELOXICAM 7.5 MG PO TABS: 7.5000 mg | ORAL_TABLET | Freq: Every day | ORAL | 1 refills | Status: DC

## 2017-12-23 NOTE — Patient Instructions (Addendum)
Continue local wound care.  Home health should come to change your bandages.     Continue all your other chronic medication.    You received a tetanus and flu vaccines today.

## 2017-12-23 NOTE — Assessment & Plan Note (Signed)
Wound still open - minimal drainage that is not pus Healing well Needs home care to help change bandages - her daughter will check with ortho if this has been ordered Clean bandage on wound now - she will not change by herself Monitor drainage and monitor for infection

## 2017-12-23 NOTE — Assessment & Plan Note (Signed)
Resolved - no redness, pain or warmth Completed antibiotics, no further antibiotics needed

## 2017-12-25 DIAGNOSIS — Z96653 Presence of artificial knee joint, bilateral: Secondary | ICD-10-CM | POA: Diagnosis not present

## 2017-12-25 DIAGNOSIS — E1140 Type 2 diabetes mellitus with diabetic neuropathy, unspecified: Secondary | ICD-10-CM | POA: Diagnosis not present

## 2017-12-25 DIAGNOSIS — Z87891 Personal history of nicotine dependence: Secondary | ICD-10-CM | POA: Diagnosis not present

## 2017-12-25 DIAGNOSIS — I370 Nonrheumatic pulmonary valve stenosis: Secondary | ICD-10-CM | POA: Diagnosis not present

## 2017-12-25 DIAGNOSIS — Z85038 Personal history of other malignant neoplasm of large intestine: Secondary | ICD-10-CM | POA: Diagnosis not present

## 2017-12-25 DIAGNOSIS — F0390 Unspecified dementia without behavioral disturbance: Secondary | ICD-10-CM | POA: Diagnosis not present

## 2017-12-25 DIAGNOSIS — M48061 Spinal stenosis, lumbar region without neurogenic claudication: Secondary | ICD-10-CM | POA: Diagnosis not present

## 2017-12-25 DIAGNOSIS — M109 Gout, unspecified: Secondary | ICD-10-CM | POA: Diagnosis not present

## 2017-12-25 DIAGNOSIS — Z96612 Presence of left artificial shoulder joint: Secondary | ICD-10-CM | POA: Diagnosis not present

## 2017-12-25 DIAGNOSIS — K219 Gastro-esophageal reflux disease without esophagitis: Secondary | ICD-10-CM | POA: Diagnosis not present

## 2017-12-25 DIAGNOSIS — Z7982 Long term (current) use of aspirin: Secondary | ICD-10-CM | POA: Diagnosis not present

## 2017-12-25 DIAGNOSIS — Z48817 Encounter for surgical aftercare following surgery on the skin and subcutaneous tissue: Secondary | ICD-10-CM | POA: Diagnosis not present

## 2017-12-25 DIAGNOSIS — K5730 Diverticulosis of large intestine without perforation or abscess without bleeding: Secondary | ICD-10-CM | POA: Diagnosis not present

## 2017-12-25 DIAGNOSIS — Z951 Presence of aortocoronary bypass graft: Secondary | ICD-10-CM | POA: Diagnosis not present

## 2017-12-25 DIAGNOSIS — D649 Anemia, unspecified: Secondary | ICD-10-CM | POA: Diagnosis not present

## 2017-12-25 DIAGNOSIS — I10 Essential (primary) hypertension: Secondary | ICD-10-CM | POA: Diagnosis not present

## 2017-12-25 DIAGNOSIS — G4700 Insomnia, unspecified: Secondary | ICD-10-CM | POA: Diagnosis not present

## 2017-12-25 DIAGNOSIS — F329 Major depressive disorder, single episode, unspecified: Secondary | ICD-10-CM | POA: Diagnosis not present

## 2017-12-25 DIAGNOSIS — E1151 Type 2 diabetes mellitus with diabetic peripheral angiopathy without gangrene: Secondary | ICD-10-CM | POA: Diagnosis not present

## 2017-12-25 DIAGNOSIS — Z9181 History of falling: Secondary | ICD-10-CM | POA: Diagnosis not present

## 2017-12-28 DIAGNOSIS — E1151 Type 2 diabetes mellitus with diabetic peripheral angiopathy without gangrene: Secondary | ICD-10-CM | POA: Diagnosis not present

## 2017-12-28 DIAGNOSIS — E1140 Type 2 diabetes mellitus with diabetic neuropathy, unspecified: Secondary | ICD-10-CM | POA: Diagnosis not present

## 2017-12-28 DIAGNOSIS — M109 Gout, unspecified: Secondary | ICD-10-CM | POA: Diagnosis not present

## 2017-12-28 DIAGNOSIS — Z48817 Encounter for surgical aftercare following surgery on the skin and subcutaneous tissue: Secondary | ICD-10-CM | POA: Diagnosis not present

## 2017-12-28 DIAGNOSIS — I10 Essential (primary) hypertension: Secondary | ICD-10-CM | POA: Diagnosis not present

## 2017-12-28 DIAGNOSIS — M48061 Spinal stenosis, lumbar region without neurogenic claudication: Secondary | ICD-10-CM | POA: Diagnosis not present

## 2017-12-29 DIAGNOSIS — M48061 Spinal stenosis, lumbar region without neurogenic claudication: Secondary | ICD-10-CM | POA: Diagnosis not present

## 2017-12-29 DIAGNOSIS — E1151 Type 2 diabetes mellitus with diabetic peripheral angiopathy without gangrene: Secondary | ICD-10-CM | POA: Diagnosis not present

## 2017-12-29 DIAGNOSIS — M109 Gout, unspecified: Secondary | ICD-10-CM | POA: Diagnosis not present

## 2017-12-29 DIAGNOSIS — E1140 Type 2 diabetes mellitus with diabetic neuropathy, unspecified: Secondary | ICD-10-CM | POA: Diagnosis not present

## 2017-12-29 DIAGNOSIS — I10 Essential (primary) hypertension: Secondary | ICD-10-CM | POA: Diagnosis not present

## 2017-12-29 DIAGNOSIS — Z48817 Encounter for surgical aftercare following surgery on the skin and subcutaneous tissue: Secondary | ICD-10-CM | POA: Diagnosis not present

## 2017-12-31 DIAGNOSIS — E1140 Type 2 diabetes mellitus with diabetic neuropathy, unspecified: Secondary | ICD-10-CM | POA: Diagnosis not present

## 2017-12-31 DIAGNOSIS — M109 Gout, unspecified: Secondary | ICD-10-CM | POA: Diagnosis not present

## 2017-12-31 DIAGNOSIS — I10 Essential (primary) hypertension: Secondary | ICD-10-CM | POA: Diagnosis not present

## 2017-12-31 DIAGNOSIS — M48061 Spinal stenosis, lumbar region without neurogenic claudication: Secondary | ICD-10-CM | POA: Diagnosis not present

## 2017-12-31 DIAGNOSIS — Z48817 Encounter for surgical aftercare following surgery on the skin and subcutaneous tissue: Secondary | ICD-10-CM | POA: Diagnosis not present

## 2017-12-31 DIAGNOSIS — E1151 Type 2 diabetes mellitus with diabetic peripheral angiopathy without gangrene: Secondary | ICD-10-CM | POA: Diagnosis not present

## 2018-01-03 DIAGNOSIS — E1140 Type 2 diabetes mellitus with diabetic neuropathy, unspecified: Secondary | ICD-10-CM | POA: Diagnosis not present

## 2018-01-03 DIAGNOSIS — Z48817 Encounter for surgical aftercare following surgery on the skin and subcutaneous tissue: Secondary | ICD-10-CM | POA: Diagnosis not present

## 2018-01-03 DIAGNOSIS — E1151 Type 2 diabetes mellitus with diabetic peripheral angiopathy without gangrene: Secondary | ICD-10-CM | POA: Diagnosis not present

## 2018-01-03 DIAGNOSIS — I10 Essential (primary) hypertension: Secondary | ICD-10-CM | POA: Diagnosis not present

## 2018-01-03 DIAGNOSIS — M48061 Spinal stenosis, lumbar region without neurogenic claudication: Secondary | ICD-10-CM | POA: Diagnosis not present

## 2018-01-03 DIAGNOSIS — M109 Gout, unspecified: Secondary | ICD-10-CM | POA: Diagnosis not present

## 2018-01-05 DIAGNOSIS — E1140 Type 2 diabetes mellitus with diabetic neuropathy, unspecified: Secondary | ICD-10-CM | POA: Diagnosis not present

## 2018-01-05 DIAGNOSIS — Z48817 Encounter for surgical aftercare following surgery on the skin and subcutaneous tissue: Secondary | ICD-10-CM | POA: Diagnosis not present

## 2018-01-05 DIAGNOSIS — E1151 Type 2 diabetes mellitus with diabetic peripheral angiopathy without gangrene: Secondary | ICD-10-CM | POA: Diagnosis not present

## 2018-01-05 DIAGNOSIS — M109 Gout, unspecified: Secondary | ICD-10-CM | POA: Diagnosis not present

## 2018-01-05 DIAGNOSIS — M48061 Spinal stenosis, lumbar region without neurogenic claudication: Secondary | ICD-10-CM | POA: Diagnosis not present

## 2018-01-05 DIAGNOSIS — I10 Essential (primary) hypertension: Secondary | ICD-10-CM | POA: Diagnosis not present

## 2018-01-06 ENCOUNTER — Ambulatory Visit (INDEPENDENT_AMBULATORY_CARE_PROVIDER_SITE_OTHER): Payer: Medicare Other | Admitting: Internal Medicine

## 2018-01-06 ENCOUNTER — Encounter: Payer: Self-pay | Admitting: Internal Medicine

## 2018-01-06 VITALS — BP 108/52 | HR 62 | Ht 59.0 in | Wt 135.8 lb

## 2018-01-06 DIAGNOSIS — I10 Essential (primary) hypertension: Secondary | ICD-10-CM | POA: Diagnosis not present

## 2018-01-06 DIAGNOSIS — E782 Mixed hyperlipidemia: Secondary | ICD-10-CM

## 2018-01-06 DIAGNOSIS — I2510 Atherosclerotic heart disease of native coronary artery without angina pectoris: Secondary | ICD-10-CM

## 2018-01-06 DIAGNOSIS — I25810 Atherosclerosis of coronary artery bypass graft(s) without angina pectoris: Secondary | ICD-10-CM

## 2018-01-06 NOTE — Patient Instructions (Signed)
Your physician recommends that you continue on your current medications as directed. Please refer to the Current Medication list given to you today. Your physician wants you to follow-up in: May 2020 with Dr. Harrington Challenger.  You will receive a reminder letter in the mail two months in advance. If you don't receive a letter, please call our office to schedule the follow-up appointment.

## 2018-01-06 NOTE — Progress Notes (Signed)
Cardiology Office Note   Date:  01/06/2018   ID:  [name], DOB April 19, 1931, MRN [id number]  PCP:  Susan Rail, MD  Cardiologist:   Dorris Carnes, MD   F/U of CAD      History of Present Illness: [name] is a 84 y.o. female with a history of CAD (s/p CABG in 2010), HTN, HL and fibromyalgia   Myovue in  Novmeber 2018 was negative for ischemia The patient was last in cardiology clinic in Jan 2019    Since seen she denies CP  Breathing is OK    Patient says appetite is down from previous       Outpatient Medications Prior to Visit  Medication Sig Dispense Refill  . amLODipine (NORVASC) 5 MG tablet Take 1 tablet (5 mg total) by mouth 2 (two) times daily. 180 tablet 3  . aspirin EC 81 MG tablet Take 81 mg by mouth daily.    Marland Kitchen atorvastatin (LIPITOR) 40 MG tablet Take 1 tablet (40 mg total) by mouth daily. 90 tablet 3  . carvedilol (COREG) 6.25 MG tablet TAKE ONE TABLET BY MOUTH TWICE A DAY WITH A MEAL (Patient taking differently: Take 6.25 mg by mouth 2 (two) times daily. ) 180 tablet 3  . cetirizine (ZYRTEC) 10 MG tablet TAKE ONE TABLET BY MOUTH DAILY (Patient taking differently: Take 10 mg by mouth at bedtime. ) 90 tablet 3  . diclofenac sodium (VOLTAREN) 1 % GEL Apply 2 g topically 4 (four) times daily as needed (pain).    Marland Kitchen donepezil (ARICEPT) 10 MG tablet Take 1 tablet daily (Patient taking differently: Take 10 mg by mouth daily. ) 90 tablet 3  . DULoxetine (CYMBALTA) 30 MG capsule Take 1 capsule (30 mg total) by mouth daily. 90 capsule 1  . hydrochlorothiazide (HYDRODIURIL) 12.5 MG tablet Take 1 tablet (12.5 mg total) by mouth daily. 90 tablet 3  . meloxicam (MOBIC) 7.5 MG tablet Take 1 tablet (7.5 mg total) by mouth daily. 90 tablet 1  . memantine (NAMENDA) 10 MG tablet Take 1 tablet (10 mg total) by mouth 2 (two) times daily. 180 tablet 3  . omeprazole (PRILOSEC) 40 MG capsule TAKE 1 CAPSULE (40 MG TOTAL) BY MOUTH DAILY ON AN EMPTY STOMACH (Patient taking  differently: Take 40 mg by mouth daily. ) 90 capsule 3  . ramipril (ALTACE) 10 MG capsule Take 1 capsule (10 mg total) by mouth 2 (two) times daily. 180 capsule 3  . gabapentin (NEURONTIN) 300 MG capsule TAKE ONE CAPSULE BY MOUTH TWICE DAILY. (Patient not taking: No sig reported) 60 capsule 5   No facility-administered medications prior to visit.      Allergies:   Patient has no known allergies.   Past Medical History:  Diagnosis Date  . CAD, NATIVE VESSEL 06/09/2008  . DIABETES MELLITUS, TYPE II   . FIBROMYALGIA   . GERD   . Gout   . HIATAL HERNIA   . HYPERLIPIDEMIA   . HYPERTENSION   . Insomnia   . OA (osteoarthritis)    severe, shoulders, hands - inflammatory, ?RA  . S/P CABG x 1   . S/P inguinal hernia repair   . TUBULOVILLOUS ADENOMA, 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   colo 04/2012 - no polyps - no further screening planned (age >89)  . UNSPECIFIED ANEMIA     Past Surgical History:  Procedure Laterality Date  . ABDOMINAL HYSTERECTOMY  1981  . APPENDECTOMY    . CATARACT EXTRACTION, BILATERAL    .  CORONARY ARTERY BYPASS GRAFT  2010  . HERNIA REPAIR     INGUINAL  . I&D EXTREMITY Left 12/07/2017   Procedure: IRRIGATION AND DEBRIDEMENT EXTREMITY WITH FOREIGN BODY REMOVAL;  Surgeon: Nicholes Stairs, MD;  Location: [facility];  Service: Orthopedics;  Laterality: Left;  . JOINT REPLACEMENT    . OVARIAN CYST REMOVAL    . REPLACEMENT TOTAL KNEE BILATERAL Bilateral    Rt-1974, Lt -1989  . REVERSE SHOULDER ARTHROPLASTY  09/08/2011   Procedure: REVERSE SHOULDER ARTHROPLASTY;  Surgeon: Nita Sells, MD;  Location: [facility];  Service: Orthopedics;  Laterality: Left;  left reverse total shoulder arthroplasty  . ROTATOR CUFF REPAIR     Left     Social History:  The patient  reports that she quit smoking about 47 years ago. She has never used smokeless tobacco. She reports that she drinks about 1.0 standard drinks of alcohol per week. She reports that she does not use drugs.   Family  History:  The patient's family history includes Cancer in her mother; Dementia in her sister; Diabetes in her daughter, father, and sister; Heart attack in her brother and father; Heart disease in her sister; Heart disease (age of onset: 36) in her father; Hypertension in her daughter and mother; Parkinsonism in her sister; Stomach cancer (age of onset: 60) in her mother; Stroke in her sister and sister; Vascular Disease in her sister.    ROS:  Please see the history of present illness. All other systems are reviewed and  Negative to the above problem except as noted.    PHYSICAL EXAM: VS:  BP (!) 108/52   Pulse 62   Ht 4\' 11"  (1.499 m)   Wt 135 lb 12.8 oz (61.6 kg)   SpO2 97%   BMI 27.43 kg/m   GEN: Well nourished, well developed, in no acute distress  HEENT: normal  Neck: JVP is not elevated     No, carotid bruits, or masses Cardiac: RRR; I-II/V systlolic murmur  No , rubs, or gallops,no edema  Respiratory:  clear to auscultation bilaterally, normal work of breathing GI: soft, nontender, nondistended, + BS  No hepatomegaly  MS: no deformity Moving all extremities   Skin: warm and dry, no rash Neuro:  Strength and sensation are intact Psych: euthymic mood, full affect   EKG:  EKG is not ordered today.  SR 68 bpm     Lipid Panel    Component Value Date/Time   CHOL 193 08/19/2017 1141   TRIG 99.0 08/19/2017 1141   HDL 109.10 08/19/2017 1141   CHOLHDL 2 08/19/2017 1141   VLDL 19.8 08/19/2017 1141   LDLCALC 64 08/19/2017 1141   LDLDIRECT 115.3 09/30/2009 1059      Wt Readings from Last 3 Encounters:  01/06/18 135 lb 12.8 oz (61.6 kg)  12/23/17 137 lb (62.1 kg)  12/07/17 141 lb 15.6 oz (64.4 kg)      ASSESSMENT AND PLAN:  1  CAD  Pt remains asymptomatic      2  HL  LDL in May was 20   Keep on same meds  3.  HTN  BP is good  COtinue meds     Disposition:   FU with in May 2020  Signed, Dorris Carnes, MD  01/06/2018 10:30 AM    [facility]  [facility] [address], [place], [place]  [address] Phone: [phone]; Fax: [phone]

## 2018-01-07 DIAGNOSIS — E1140 Type 2 diabetes mellitus with diabetic neuropathy, unspecified: Secondary | ICD-10-CM | POA: Diagnosis not present

## 2018-01-07 DIAGNOSIS — I10 Essential (primary) hypertension: Secondary | ICD-10-CM | POA: Diagnosis not present

## 2018-01-07 DIAGNOSIS — M109 Gout, unspecified: Secondary | ICD-10-CM | POA: Diagnosis not present

## 2018-01-07 DIAGNOSIS — Z48817 Encounter for surgical aftercare following surgery on the skin and subcutaneous tissue: Secondary | ICD-10-CM | POA: Diagnosis not present

## 2018-01-07 DIAGNOSIS — E1151 Type 2 diabetes mellitus with diabetic peripheral angiopathy without gangrene: Secondary | ICD-10-CM | POA: Diagnosis not present

## 2018-01-07 DIAGNOSIS — M48061 Spinal stenosis, lumbar region without neurogenic claudication: Secondary | ICD-10-CM | POA: Diagnosis not present

## 2018-01-10 DIAGNOSIS — M48061 Spinal stenosis, lumbar region without neurogenic claudication: Secondary | ICD-10-CM | POA: Diagnosis not present

## 2018-01-10 DIAGNOSIS — Z48817 Encounter for surgical aftercare following surgery on the skin and subcutaneous tissue: Secondary | ICD-10-CM | POA: Diagnosis not present

## 2018-01-10 DIAGNOSIS — M109 Gout, unspecified: Secondary | ICD-10-CM | POA: Diagnosis not present

## 2018-01-10 DIAGNOSIS — E1151 Type 2 diabetes mellitus with diabetic peripheral angiopathy without gangrene: Secondary | ICD-10-CM | POA: Diagnosis not present

## 2018-01-10 DIAGNOSIS — E1140 Type 2 diabetes mellitus with diabetic neuropathy, unspecified: Secondary | ICD-10-CM | POA: Diagnosis not present

## 2018-01-10 DIAGNOSIS — I10 Essential (primary) hypertension: Secondary | ICD-10-CM | POA: Diagnosis not present

## 2018-01-11 DIAGNOSIS — I10 Essential (primary) hypertension: Secondary | ICD-10-CM | POA: Diagnosis not present

## 2018-01-11 DIAGNOSIS — M48061 Spinal stenosis, lumbar region without neurogenic claudication: Secondary | ICD-10-CM | POA: Diagnosis not present

## 2018-01-11 DIAGNOSIS — M109 Gout, unspecified: Secondary | ICD-10-CM | POA: Diagnosis not present

## 2018-01-11 DIAGNOSIS — E1140 Type 2 diabetes mellitus with diabetic neuropathy, unspecified: Secondary | ICD-10-CM | POA: Diagnosis not present

## 2018-01-11 DIAGNOSIS — Z48817 Encounter for surgical aftercare following surgery on the skin and subcutaneous tissue: Secondary | ICD-10-CM | POA: Diagnosis not present

## 2018-01-11 DIAGNOSIS — E1151 Type 2 diabetes mellitus with diabetic peripheral angiopathy without gangrene: Secondary | ICD-10-CM | POA: Diagnosis not present

## 2018-01-12 ENCOUNTER — Telehealth: Payer: Self-pay

## 2018-01-12 DIAGNOSIS — I10 Essential (primary) hypertension: Secondary | ICD-10-CM | POA: Diagnosis not present

## 2018-01-12 DIAGNOSIS — M48061 Spinal stenosis, lumbar region without neurogenic claudication: Secondary | ICD-10-CM | POA: Diagnosis not present

## 2018-01-12 DIAGNOSIS — E1151 Type 2 diabetes mellitus with diabetic peripheral angiopathy without gangrene: Secondary | ICD-10-CM | POA: Diagnosis not present

## 2018-01-12 DIAGNOSIS — Z48817 Encounter for surgical aftercare following surgery on the skin and subcutaneous tissue: Secondary | ICD-10-CM | POA: Diagnosis not present

## 2018-01-12 DIAGNOSIS — M109 Gout, unspecified: Secondary | ICD-10-CM | POA: Diagnosis not present

## 2018-01-12 DIAGNOSIS — E1140 Type 2 diabetes mellitus with diabetic neuropathy, unspecified: Secondary | ICD-10-CM | POA: Diagnosis not present

## 2018-01-12 NOTE — Telephone Encounter (Signed)
Copied from [place] [id number]. Topic: General - Other >> Jan 11, 2018  4:34 PM Valla Leaver wrote: Reason for CRM: Maline, Lav[place] called about a form regarding hereditary cancer test requisition that was faxed on 9/16 , she is following up on this to see if this has been taken care of.  Call back @ [phone] ext [phone]. Previous crm says resolved but Maline was never contacted.

## 2018-01-12 NOTE — Telephone Encounter (Signed)
Tried calling [name] back in regards. LVM for her to call my direct number. Needs forms refaxed over.

## 2018-01-13 NOTE — Telephone Encounter (Signed)
Spoke with daughter Dr. [name] Heck and she advised that she knew nothing about this testing and that she was going to check into it and advised NOTHING be signed until we are told other wise by her.

## 2018-01-13 NOTE — Telephone Encounter (Signed)
LVM for Daughter to call back in regards to form before anything is signed and sent.

## 2018-01-14 DIAGNOSIS — E1140 Type 2 diabetes mellitus with diabetic neuropathy, unspecified: Secondary | ICD-10-CM | POA: Diagnosis not present

## 2018-01-14 DIAGNOSIS — Z48817 Encounter for surgical aftercare following surgery on the skin and subcutaneous tissue: Secondary | ICD-10-CM | POA: Diagnosis not present

## 2018-01-14 DIAGNOSIS — M48061 Spinal stenosis, lumbar region without neurogenic claudication: Secondary | ICD-10-CM | POA: Diagnosis not present

## 2018-01-14 DIAGNOSIS — E1151 Type 2 diabetes mellitus with diabetic peripheral angiopathy without gangrene: Secondary | ICD-10-CM | POA: Diagnosis not present

## 2018-01-14 DIAGNOSIS — I10 Essential (primary) hypertension: Secondary | ICD-10-CM | POA: Diagnosis not present

## 2018-01-14 DIAGNOSIS — M109 Gout, unspecified: Secondary | ICD-10-CM | POA: Diagnosis not present

## 2018-01-17 DIAGNOSIS — Z48817 Encounter for surgical aftercare following surgery on the skin and subcutaneous tissue: Secondary | ICD-10-CM | POA: Diagnosis not present

## 2018-01-17 DIAGNOSIS — M48061 Spinal stenosis, lumbar region without neurogenic claudication: Secondary | ICD-10-CM | POA: Diagnosis not present

## 2018-01-17 DIAGNOSIS — E1151 Type 2 diabetes mellitus with diabetic peripheral angiopathy without gangrene: Secondary | ICD-10-CM | POA: Diagnosis not present

## 2018-01-17 DIAGNOSIS — I10 Essential (primary) hypertension: Secondary | ICD-10-CM | POA: Diagnosis not present

## 2018-01-17 DIAGNOSIS — M109 Gout, unspecified: Secondary | ICD-10-CM | POA: Diagnosis not present

## 2018-01-17 DIAGNOSIS — E1140 Type 2 diabetes mellitus with diabetic neuropathy, unspecified: Secondary | ICD-10-CM | POA: Diagnosis not present

## 2018-01-19 DIAGNOSIS — Z48817 Encounter for surgical aftercare following surgery on the skin and subcutaneous tissue: Secondary | ICD-10-CM | POA: Diagnosis not present

## 2018-01-19 DIAGNOSIS — M48061 Spinal stenosis, lumbar region without neurogenic claudication: Secondary | ICD-10-CM | POA: Diagnosis not present

## 2018-01-19 DIAGNOSIS — M109 Gout, unspecified: Secondary | ICD-10-CM | POA: Diagnosis not present

## 2018-01-19 DIAGNOSIS — E1151 Type 2 diabetes mellitus with diabetic peripheral angiopathy without gangrene: Secondary | ICD-10-CM | POA: Diagnosis not present

## 2018-01-19 DIAGNOSIS — I10 Essential (primary) hypertension: Secondary | ICD-10-CM | POA: Diagnosis not present

## 2018-01-19 DIAGNOSIS — E1140 Type 2 diabetes mellitus with diabetic neuropathy, unspecified: Secondary | ICD-10-CM | POA: Diagnosis not present

## 2018-01-20 ENCOUNTER — Encounter: Payer: Self-pay | Admitting: Nurse Practitioner

## 2018-01-20 ENCOUNTER — Ambulatory Visit (INDEPENDENT_AMBULATORY_CARE_PROVIDER_SITE_OTHER): Payer: Medicare Other | Admitting: Nurse Practitioner

## 2018-01-20 ENCOUNTER — Other Ambulatory Visit (INDEPENDENT_AMBULATORY_CARE_PROVIDER_SITE_OTHER): Payer: Medicare Other

## 2018-01-20 VITALS — BP 128/60 | HR 62 | Temp 97.9°F | Ht 59.0 in | Wt 134.0 lb

## 2018-01-20 DIAGNOSIS — R197 Diarrhea, unspecified: Secondary | ICD-10-CM | POA: Diagnosis not present

## 2018-01-20 DIAGNOSIS — A09 Infectious gastroenteritis and colitis, unspecified: Secondary | ICD-10-CM | POA: Diagnosis not present

## 2018-01-20 DIAGNOSIS — T3695XA Adverse effect of unspecified systemic antibiotic, initial encounter: Secondary | ICD-10-CM

## 2018-01-20 DIAGNOSIS — R109 Unspecified abdominal pain: Secondary | ICD-10-CM

## 2018-01-20 LAB — COMPREHENSIVE METABOLIC PANEL
ALT: 9 U/L (ref 0–35)
AST: 14 U/L (ref 0–37)
Albumin: 3.4 g/dL — ABNORMAL LOW (ref 3.5–5.2)
Alkaline Phosphatase: 75 U/L (ref 39–117)
BUN: 8 mg/dL (ref 6–23)
CO2: 31 mEq/L (ref 19–32)
Calcium: 8.6 mg/dL (ref 8.4–10.5)
Chloride: 87 mEq/L — ABNORMAL LOW (ref 96–112)
Creatinine, Ser: 0.75 mg/dL (ref 0.40–1.20)
GFR: 94.31 mL/min (ref >60.00)
Glucose, Bld: 130 mg/dL — ABNORMAL HIGH (ref 70–99)
Potassium: 3 mEq/L — ABNORMAL LOW (ref 3.5–5.1)
Sodium: 124 mEq/L — ABNORMAL LOW (ref 135–145)
Total Bilirubin: 0.4 mg/dL (ref 0.2–1.2)
Total Protein: 6.6 g/dL (ref 6.0–8.3)

## 2018-01-20 LAB — CBC
HCT: 35.7 % — ABNORMAL LOW (ref 36.0–46.0)
Hemoglobin: 12 g/dL (ref 12.0–15.0)
MCHC: 33.5 g/dL (ref 30.0–36.0)
MCV: 93.6 fl (ref 78.0–100.0)
Platelets: 455 10*3/uL — ABNORMAL HIGH (ref 150.0–400.0)
RBC: 3.82 Mil/uL — ABNORMAL LOW (ref 3.87–5.11)
RDW: 13.3 % (ref 11.5–15.5)
WBC: 8.1 10*3/uL (ref 4.0–10.5)

## 2018-01-20 NOTE — Patient Instructions (Addendum)
Please head downstairs for lab work. If any of your test results are critically abnormal, you will be contacted right away. Otherwise, I will contact you as quickly as possible with follow up plan.   Food Choices to Help Relieve Diarrhea, Adult When you have diarrhea, the foods you eat and your eating habits are very important. Choosing the right foods and drinks can help:  Relieve diarrhea.  Replace lost fluids and nutrients.  Prevent dehydration.  What general guidelines should I follow? Relieving diarrhea  Choose foods with less than 2 g or .07 oz. of fiber per serving.  Limit fats to less than 8 tsp (38 g or 1.34 oz.) a day.  Avoid the following: ? Foods and beverages sweetened with high-fructose corn syrup, honey, or sugar alcohols such as xylitol, sorbitol, and mannitol. ? Foods that contain a lot of fat or sugar. ? Fried, greasy, or spicy foods. ? High-fiber grains, breads, and cereals. ? Raw fruits and vegetables.  Eat foods that are rich in probiotics. These foods include dairy products such as yogurt and fermented milk products. They help increase healthy bacteria in the stomach and intestines (gastrointestinal tract, or GI tract).  If you have lactose intolerance, avoid dairy products. These may make your diarrhea worse.  Take medicine to help stop diarrhea (antidiarrheal medicine) only as told by your health care provider. Replacing nutrients  Eat small meals or snacks every 3-4 hours.  Eat bland foods, such as white rice, toast, or baked potato, until your diarrhea starts to get better. Gradually reintroduce nutrient-rich foods as tolerated or as told by your health care provider. This includes: ? Well-cooked protein foods. ? Peeled, seeded, and soft-cooked fruits and vegetables. ? Low-fat dairy products.  Take vitamin and mineral supplements as told by your health care provider. Preventing dehydration   Start by sipping water or a special solution to  prevent dehydration (oral rehydration solution, ORS). Urine that is clear or pale yellow means that you are getting enough fluid.  Try to drink at least 8-10 cups of fluid each day to help replace lost fluids.  You may add other liquids in addition to water, such as clear juice or decaffeinated sports drinks, as tolerated or as told by your health care provider.  Avoid drinks with caffeine, such as coffee, tea, or soft drinks.  Avoid alcohol. What foods are recommended? The items listed may not be a complete list. Talk with your health care provider about what dietary choices are best for you. Grains White rice. White, [place], or pita breads (fresh or toasted), including plain rolls, buns, or bagels. White pasta. Saltine, soda, or graham crackers. Pretzels. Low-fiber cereal. Cooked cereals made with water (such as cornmeal, farina, or cream cereals). Plain muffins. Matzo. Melba toast. Zwieback. Vegetables Potatoes (without the skin). Most well-cooked and canned vegetables without skins or seeds. Tender lettuce. Fruits Apple sauce. Fruits canned in juice. Cooked apricots, cherries, grapefruit, peaches, pears, or plums. Fresh bananas and cantaloupe. Meats and other protein foods Baked or boiled chicken. Eggs. Tofu. Fish. Seafood. Smooth nut butters. Ground or well-cooked tender beef, ham, veal, lamb, pork, or poultry. Dairy Plain yogurt, kefir, and unsweetened liquid yogurt. Lactose-free milk, buttermilk, skim milk, or soy milk. Low-fat or nonfat hard cheese. Beverages Water. Low-calorie sports drinks. Fruit juices without pulp. Strained tomato and vegetable juices. Decaffeinated teas. Sugar-free beverages not sweetened with sugar alcohols. Oral rehydration solutions, if approved by your health care provider. Seasoning and other foods Bouillon, broth, or soups made  from recommended foods. What foods are not recommended? The items listed may not be a complete list. Talk with your health care  provider about what dietary choices are best for you. Grains Whole grain, whole wheat, bran, or rye breads, rolls, pastas, and crackers. Wild or brown rice. Whole grain or bran cereals. Barley. Oats and oatmeal. Corn tortillas or taco shells. Granola. Popcorn. Vegetables Raw vegetables. Fried vegetables. Cabbage, broccoli, Brussels sprouts, artichokes, baked beans, beet greens, corn, kale, legumes, peas, sweet potatoes, and yams. Potato skins. Cooked spinach and cabbage. Fruits Dried fruit, including raisins and dates. Raw fruits. Stewed or dried prunes. Canned fruits with syrup. Meat and other protein foods Fried or fatty meats. Deli meats. Chunky nut butters. Nuts and seeds. Beans and lentils. Berniece Salines. Hot dogs. Sausage. Dairy High-fat cheeses. Whole milk, chocolate milk, and beverages made with milk, such as milk shakes. Half-and-half. Cream. sour cream. Ice cream. Beverages Caffeinated beverages (such as coffee, tea, soda, or energy drinks). Alcoholic beverages. Fruit juices with pulp. Prune juice. Soft drinks sweetened with high-fructose corn syrup or sugar alcohols. High-calorie sports drinks. Fats and oils Butter. Cream sauces. Margarine. Salad oils. Plain salad dressings. Olives. Avocados. Mayonnaise. Sweets and desserts Sweet rolls, doughnuts, and sweet breads. Sugar-free desserts sweetened with sugar alcohols such as xylitol and sorbitol. Seasoning and other foods Honey. Hot sauce. Chili powder. Gravy. Cream-based or milk-based soups. Pancakes and waffles. Summary  When you have diarrhea, the foods you eat and your eating habits are very important.  Make sure you get at least 8-10 cups of fluid each day, or enough to keep your urine clear or pale yellow.  Eat bland foods and gradually reintroduce healthy, nutrient-rich foods as tolerated, or as told by your health care provider.  Avoid high-fiber, fried, greasy, or spicy foods. This information is not intended to replace advice  given to you by your health care provider. Make sure you discuss any questions you have with your health care provider. Document Released: 06/27/2003 Document Revised: 04/03/2016 Document Reviewed: 04/03/2016 Elsevier Interactive Patient Education  [employer].

## 2018-01-20 NOTE — Progress Notes (Signed)
[name] is a 84 y.o. female with the following history as recorded in [employer]:  Patient Active Problem List   Diagnosis Date Noted  . Cellulitis of foot 12/05/2017  . Abscess of right foot 12/02/2017  . Mild dementia ([place]) 07/27/2017  . Buttock pain 10/30/2016  . Chronic RUQ pain 05/28/2016  . Right knee pain 06/14/2015  . Diabetic neuropathy ([place]) 03/21/2014  . Bunion 03/21/2014  . PVD (pulmonary valve disease) 01/23/2014  . Onychomycosis 12/20/2013  . PVD (peripheral vascular disease) ([place]) 12/19/2013  . Gout 12/13/2013  . Depression   . Spinal stenosis of lumbar region 04/24/2013  . Arthritis of shoulder region, left 09/11/2011  . Type 2 diabetes mellitus ([place]) 01/17/2009  . Anemia 10/15/2008  . CAD, NATIVE VESSEL 06/09/2008  . Hyperlipidemia 06/15/2007  . Essential hypertension 06/15/2007  . GERD 06/15/2007  . Fibromyalgia syndrome 06/15/2007  . 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 DIVERTICULOSIS, 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    Current Outpatient Medications  Medication Sig Dispense Refill  . amLODipine (NORVASC) 5 MG tablet Take 1 tablet (5 mg total) by mouth 2 (two) times daily. 180 tablet 3  . aspirin EC 81 MG tablet Take 81 mg by mouth daily.    Marland Kitchen atorvastatin (LIPITOR) 40 MG tablet Take 1 tablet (40 mg total) by mouth daily. 90 tablet 3  . carvedilol (COREG) 6.25 MG tablet TAKE ONE TABLET BY MOUTH TWICE A DAY WITH A MEAL (Patient taking differently: Take 6.25 mg by mouth 2 (two) times daily. ) 180 tablet 3  . cephALEXin (KEFLEX) 500 MG capsule Take 1 capsule by mouth every 4 (four) hours.    . cetirizine (ZYRTEC) 10 MG tablet TAKE ONE TABLET BY MOUTH DAILY (Patient taking differently: Take 10 mg by mouth at bedtime. ) 90 tablet 3  . diclofenac sodium (VOLTAREN) 1 % GEL Apply 2 g topically 4 (four) times daily as needed (pain).    Marland Kitchen donepezil (ARICEPT) 10 MG tablet Take 1 tablet daily (Patient taking differently: Take 10 mg by mouth daily. ) 90 tablet 3  .  DULoxetine (CYMBALTA) 30 MG capsule Take 1 capsule (30 mg total) by mouth daily. 90 capsule 1  . hydrochlorothiazide (HYDRODIURIL) 12.5 MG tablet Take 1 tablet (12.5 mg total) by mouth daily. 90 tablet 3  . meloxicam (MOBIC) 7.5 MG tablet Take 1 tablet (7.5 mg total) by mouth daily. 90 tablet 1  . memantine (NAMENDA) 10 MG tablet Take 1 tablet (10 mg total) by mouth 2 (two) times daily. 180 tablet 3  . omeprazole (PRILOSEC) 40 MG capsule TAKE 1 CAPSULE (40 MG TOTAL) BY MOUTH DAILY ON AN EMPTY STOMACH (Patient taking differently: Take 40 mg by mouth daily. ) 90 capsule 3  . ramipril (ALTACE) 10 MG capsule Take 1 capsule (10 mg total) by mouth 2 (two) times daily. 180 capsule 3   No current facility-administered medications for this visit.     Allergies: Patient has no known allergies.  Past Medical History:  Diagnosis Date  . CAD, NATIVE VESSEL 06/09/2008  . DIABETES MELLITUS, TYPE II   . FIBROMYALGIA   . GERD   . Gout   . HIATAL HERNIA   . HYPERLIPIDEMIA   . HYPERTENSION   . Insomnia   . OA (osteoarthritis)    severe, shoulders, hands - inflammatory, ?RA  . S/P CABG x 1   . S/P inguinal hernia repair   . TUBULOVILLOUS ADENOMA, 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   colo 04/2012 - no polyps - no further screening  planned (age >63)  . UNSPECIFIED ANEMIA     Past Surgical History:  Procedure Laterality Date  . ABDOMINAL HYSTERECTOMY  1981  . APPENDECTOMY    . CATARACT EXTRACTION, BILATERAL    . CORONARY ARTERY BYPASS GRAFT  2010  . HERNIA REPAIR     INGUINAL  . I&D EXTREMITY Left 12/07/2017   Procedure: IRRIGATION AND DEBRIDEMENT EXTREMITY WITH FOREIGN BODY REMOVAL;  Surgeon: Nicholes Stairs, MD;  Location: [facility];  Service: Orthopedics;  Laterality: Left;  . JOINT REPLACEMENT    . OVARIAN CYST REMOVAL    . REPLACEMENT TOTAL KNEE BILATERAL Bilateral    Rt-1974, Lt -1989  . REVERSE SHOULDER ARTHROPLASTY  09/08/2011   Procedure: REVERSE SHOULDER ARTHROPLASTY;  Surgeon: Nita Sells,  MD;  Location: [facility];  Service: Orthopedics;  Laterality: Left;  left reverse total shoulder arthroplasty  . ROTATOR CUFF REPAIR     Left    Family History  Problem Relation Age of Onset  . Cancer Mother        Stomach  . Stomach cancer Mother 4  . Hypertension Mother   . Heart disease Father 65       MI  . Diabetes Father   . Heart attack Father   . Diabetes Daughter   . Hypertension Daughter   . Vascular Disease Sister   . Stroke Sister   . Dementia Sister   . Diabetes Sister   . Parkinsonism Sister   . Stroke Sister   . Heart disease Sister   . Heart attack Brother   . Anesthesia problems Neg Hx   . Colon cancer Neg Hx     Social History   Tobacco Use  . Smoking status: Former Smoker    Last attempt to quit: 04/26/1970    Years since quitting: 47.7  . Smokeless tobacco: Never Used  Substance Use Topics  . Alcohol use: Yes    Alcohol/week: 1.0 standard drinks    Types: 1 Glasses of wine per week    Subjective:   Ms Galas is here today for evaluation of acute complaint of diarrhea, accompanied by her daughter today. Diarrhea first began about 2 weeks ago after eating some seafood that was not fully cooked. After several days of diarrhea, she began taking pepto, diarrhea seemed to resolve for a short time, until she started a course of keflex by ortho provider last week for a foreign body in her foot, since starting keflex diarrhea has returned, describes as several soft stools about 3 times a day, along with decreased appetite, mild generalized abdominal pain, and not feeling as well as normal. Denies fevers, chills, nausea, vomiting, urinary frequency, dysuria, hematuria, vaginal discharge or bleeding, rectal bleeding Tried- pepto, ginger ale    Objective:  Vitals:   01/20/18 1357  BP: 128/60  Pulse: 62  Temp: 97.9 F (36.6 C)  TempSrc: Oral  SpO2: 98%  Weight: 134 lb (60.8 kg)  Height: 4\' 11"  (1.499 m)    General: Well developed, well nourished, in no  acute distress  Skin : Warm and dry.  Head: Normocephalic and atraumatic  Eyes: Sclera and conjunctiva clear; pupils round and reactive to light; extraocular movements intact  Ears: External normal; canals clear; tympanic membranes normal  Oropharynx: Pink, supple. No suspicious lesions  Neck: Supple without thyromegaly, adenopathy  Lungs: Respirations unlabored; clear to auscultation bilaterally without wheeze, rales, rhonchi  CVS exam: normal rate, regular rhythm, normal S1, S2, distal pulses intact Abdomen: Soft; nondistended; normoactive  bowel sounds; no masses or hepatosplenomegaly; mild generalized tenderness.  Neurologic: Alert and oriented; speech intact; face symmetrical; moves all extremities well; CNII-XII intact without focal deficit   Physical Exam   Assessment:  1. Diarrhea, unspecified type     Plan:   Labs ordered today Discussed home management of diarrhea and printed additional information on AVS F/U with further recommendations pending lab results - Gastrointestinal Pathogen Panel PCR; Future - CBC; Future - Comprehensive metabolic panel; Future - C. difficile GDH and Toxin A/B; Future   No follow-ups on file.  No orders of the defined types were placed in this encounter.   Requested Prescriptions    No prescriptions requested or ordered in this encounter

## 2018-01-21 ENCOUNTER — Other Ambulatory Visit: Payer: Self-pay | Admitting: Nurse Practitioner

## 2018-01-21 DIAGNOSIS — I10 Essential (primary) hypertension: Secondary | ICD-10-CM | POA: Diagnosis not present

## 2018-01-21 DIAGNOSIS — E1151 Type 2 diabetes mellitus with diabetic peripheral angiopathy without gangrene: Secondary | ICD-10-CM | POA: Diagnosis not present

## 2018-01-21 DIAGNOSIS — M48061 Spinal stenosis, lumbar region without neurogenic claudication: Secondary | ICD-10-CM | POA: Diagnosis not present

## 2018-01-21 DIAGNOSIS — Z48817 Encounter for surgical aftercare following surgery on the skin and subcutaneous tissue: Secondary | ICD-10-CM | POA: Diagnosis not present

## 2018-01-21 DIAGNOSIS — M109 Gout, unspecified: Secondary | ICD-10-CM | POA: Diagnosis not present

## 2018-01-21 DIAGNOSIS — R899 Unspecified abnormal finding in specimens from other organs, systems and tissues: Secondary | ICD-10-CM

## 2018-01-21 DIAGNOSIS — E1140 Type 2 diabetes mellitus with diabetic neuropathy, unspecified: Secondary | ICD-10-CM | POA: Diagnosis not present

## 2018-01-21 MED ORDER — METRONIDAZOLE 500 MG PO TABS: 500.0000 mg | ORAL_TABLET | Freq: Three times a day (TID) | ORAL | 0 refills | Status: DC

## 2018-01-24 DIAGNOSIS — I10 Essential (primary) hypertension: Secondary | ICD-10-CM | POA: Diagnosis not present

## 2018-01-24 DIAGNOSIS — E1151 Type 2 diabetes mellitus with diabetic peripheral angiopathy without gangrene: Secondary | ICD-10-CM | POA: Diagnosis not present

## 2018-01-24 DIAGNOSIS — E1140 Type 2 diabetes mellitus with diabetic neuropathy, unspecified: Secondary | ICD-10-CM | POA: Diagnosis not present

## 2018-01-24 DIAGNOSIS — Z48817 Encounter for surgical aftercare following surgery on the skin and subcutaneous tissue: Secondary | ICD-10-CM | POA: Diagnosis not present

## 2018-01-24 DIAGNOSIS — M109 Gout, unspecified: Secondary | ICD-10-CM | POA: Diagnosis not present

## 2018-01-24 DIAGNOSIS — M48061 Spinal stenosis, lumbar region without neurogenic claudication: Secondary | ICD-10-CM | POA: Diagnosis not present

## 2018-01-26 DIAGNOSIS — M109 Gout, unspecified: Secondary | ICD-10-CM | POA: Diagnosis not present

## 2018-01-26 DIAGNOSIS — E1151 Type 2 diabetes mellitus with diabetic peripheral angiopathy without gangrene: Secondary | ICD-10-CM | POA: Diagnosis not present

## 2018-01-26 DIAGNOSIS — M48061 Spinal stenosis, lumbar region without neurogenic claudication: Secondary | ICD-10-CM | POA: Diagnosis not present

## 2018-01-26 DIAGNOSIS — Z48817 Encounter for surgical aftercare following surgery on the skin and subcutaneous tissue: Secondary | ICD-10-CM | POA: Diagnosis not present

## 2018-01-26 DIAGNOSIS — E1140 Type 2 diabetes mellitus with diabetic neuropathy, unspecified: Secondary | ICD-10-CM | POA: Diagnosis not present

## 2018-01-26 DIAGNOSIS — I10 Essential (primary) hypertension: Secondary | ICD-10-CM | POA: Diagnosis not present

## 2018-01-28 ENCOUNTER — Other Ambulatory Visit: Payer: Self-pay

## 2018-01-28 ENCOUNTER — Ambulatory Visit (INDEPENDENT_AMBULATORY_CARE_PROVIDER_SITE_OTHER): Payer: Medicare Other | Admitting: Neurology

## 2018-01-28 ENCOUNTER — Encounter: Payer: Self-pay | Admitting: Neurology

## 2018-01-28 VITALS — BP 96/58 | HR 59 | Ht 59.0 in | Wt 133.0 lb

## 2018-01-28 DIAGNOSIS — I25810 Atherosclerosis of coronary artery bypass graft(s) without angina pectoris: Secondary | ICD-10-CM | POA: Diagnosis not present

## 2018-01-28 DIAGNOSIS — E1151 Type 2 diabetes mellitus with diabetic peripheral angiopathy without gangrene: Secondary | ICD-10-CM | POA: Diagnosis not present

## 2018-01-28 DIAGNOSIS — F0390 Unspecified dementia without behavioral disturbance: Secondary | ICD-10-CM

## 2018-01-28 DIAGNOSIS — M48061 Spinal stenosis, lumbar region without neurogenic claudication: Secondary | ICD-10-CM | POA: Diagnosis not present

## 2018-01-28 DIAGNOSIS — Z48817 Encounter for surgical aftercare following surgery on the skin and subcutaneous tissue: Secondary | ICD-10-CM | POA: Diagnosis not present

## 2018-01-28 DIAGNOSIS — F320 Major depressive disorder, single episode, mild: Secondary | ICD-10-CM | POA: Diagnosis not present

## 2018-01-28 DIAGNOSIS — M109 Gout, unspecified: Secondary | ICD-10-CM | POA: Diagnosis not present

## 2018-01-28 DIAGNOSIS — F03A0 Unspecified dementia, mild, without behavioral disturbance, psychotic disturbance, mood disturbance, and anxiety: Secondary | ICD-10-CM

## 2018-01-28 DIAGNOSIS — E1140 Type 2 diabetes mellitus with diabetic neuropathy, unspecified: Secondary | ICD-10-CM | POA: Diagnosis not present

## 2018-01-28 DIAGNOSIS — I10 Essential (primary) hypertension: Secondary | ICD-10-CM | POA: Diagnosis not present

## 2018-01-28 LAB — C. DIFFICILE GDH AND TOXIN A/B
GDH ANTIGEN: DETECTED
MICRO NUMBER:: 91189920
SPECIMEN QUALITY:: ADEQUATE
TOXIN A AND B: DETECTED

## 2018-01-28 LAB — GASTROINTESTINAL PATHOGEN PANEL PCR
C. difficile Tox A/B, PCR: DETECTED — AB
Campylobacter, PCR: NOT DETECTED
Cryptosporidium, PCR: NOT DETECTED
E coli (ETEC) LT/ST PCR: NOT DETECTED
E coli (STEC) stx1/stx2, PCR: NOT DETECTED
E coli 0157, PCR: NOT DETECTED
Giardia lamblia, PCR: NOT DETECTED
Norovirus, PCR: NOT DETECTED
Rotavirus A, PCR: NOT DETECTED
Salmonella, PCR: NOT DETECTED
Shigella, PCR: NOT DETECTED

## 2018-01-28 MED ORDER — DULOXETINE HCL 60 MG PO CPEP: 60.0000 mg | ORAL_CAPSULE | Freq: Every day | ORAL | 3 refills | Status: DC

## 2018-01-28 MED ORDER — MEMANTINE HCL 10 MG PO TABS: 10.0000 mg | ORAL_TABLET | Freq: Two times a day (BID) | ORAL | 3 refills | Status: DC

## 2018-01-28 MED ORDER — DONEPEZIL HCL 10 MG PO TABS: ORAL_TABLET | ORAL | 3 refills | Status: DC
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
## 2018-01-28 NOTE — Patient Instructions (Signed)
1. Continue Donepezil 10mg  daily and Memantine 10mg  twice a day 2. Increase Cymbalta to 103m every night 3. Proceed with day program 4. Follow-up in 6 months, call for any changes  FALL PRECAUTIONS: Be cautious when walking. Scan the area for obstacles that may increase the risk of trips and falls. When getting up in the mornings, sit up at the edge of the bed for a few minutes before getting out of bed. Consider elevating the bed at the head end to avoid drop of blood pressure when getting up. Walk always in a well-lit room (use night lights in the walls). Avoid area rugs or power cords from appliances in the middle of the walkways. Use a walker or a cane if necessary and consider physical therapy for balance exercise. Get your eyesight checked regularly.  HOME SAFETY: Consider the safety of the kitchen when operating appliances like stoves, microwave oven, and blender. Consider having supervision and share cooking responsibilities until no longer able to participate in those. Accidents with firearms and other hazards in the house should be identified and addressed as well.  ABILITY TO BE LEFT ALONE: If patient is unable to contact 911 operator, consider using LifeLine, or when the need is there, arrange for someone to stay with patients. Smoking is a fire hazard, consider supervision or cessation. Risk of wandering should be assessed by caregiver and if detected at any point, supervision and safe proof recommendations should be instituted.  MEDICATION SUPERVISION: Inability to self-administer medication needs to be constantly addressed. Implement a mechanism to ensure safe administration of the medications.  RECOMMENDATIONS FOR ALL PATIENTS WITH MEMORY PROBLEMS: 1. Continue to exercise (Recommend 30 minutes of walking everyday, or 3 hours every week) 2. Increase social interactions - continue going to [place] and enjoy social gatherings with friends and family 3. Eat healthy, avoid fried foods and  eat more fruits and vegetables 4. Maintain adequate blood pressure, blood sugar, and blood cholesterol level. Reducing the risk of stroke and cardiovascular disease also helps promoting better memory. 5. Avoid stressful situations. Live a simple life and avoid aggravations. Organize your time and prepare for the next day in anticipation. 6. Sleep well, avoid any interruptions of sleep and avoid any distractions in the bedroom that may interfere with adequate sleep quality 7. Avoid sugar, avoid sweets as there is a strong link between excessive sugar intake, diabetes, and cognitive impairment We discussed the Mediterranean diet, which has been shown to help patients reduce the risk of progressive memory disorders and reduces cardiovascular risk. This includes eating fish, eat fruits and green leafy vegetables, nuts like almonds and hazelnuts, walnuts, and also use olive oil. Avoid fast foods and fried foods as much as possible. Avoid sweets and sugar as sugar use has been linked to worsening of memory function.  There is always a concern of gradual progression of memory problems. If this is the case, then we may need to adjust level of care according to patient needs. Support, both to the patient and caregiver, should then be put into place.

## 2018-01-31 DIAGNOSIS — M109 Gout, unspecified: Secondary | ICD-10-CM | POA: Diagnosis not present

## 2018-01-31 DIAGNOSIS — M48061 Spinal stenosis, lumbar region without neurogenic claudication: Secondary | ICD-10-CM | POA: Diagnosis not present

## 2018-01-31 DIAGNOSIS — I10 Essential (primary) hypertension: Secondary | ICD-10-CM | POA: Diagnosis not present

## 2018-01-31 DIAGNOSIS — E1151 Type 2 diabetes mellitus with diabetic peripheral angiopathy without gangrene: Secondary | ICD-10-CM | POA: Diagnosis not present

## 2018-01-31 DIAGNOSIS — Z48817 Encounter for surgical aftercare following surgery on the skin and subcutaneous tissue: Secondary | ICD-10-CM | POA: Diagnosis not present

## 2018-01-31 DIAGNOSIS — E1140 Type 2 diabetes mellitus with diabetic neuropathy, unspecified: Secondary | ICD-10-CM | POA: Diagnosis not present

## 2018-02-02 DIAGNOSIS — I10 Essential (primary) hypertension: Secondary | ICD-10-CM | POA: Diagnosis not present

## 2018-02-02 DIAGNOSIS — M48061 Spinal stenosis, lumbar region without neurogenic claudication: Secondary | ICD-10-CM | POA: Diagnosis not present

## 2018-02-02 DIAGNOSIS — Z48817 Encounter for surgical aftercare following surgery on the skin and subcutaneous tissue: Secondary | ICD-10-CM | POA: Diagnosis not present

## 2018-02-02 DIAGNOSIS — E1140 Type 2 diabetes mellitus with diabetic neuropathy, unspecified: Secondary | ICD-10-CM | POA: Diagnosis not present

## 2018-02-02 DIAGNOSIS — E1151 Type 2 diabetes mellitus with diabetic peripheral angiopathy without gangrene: Secondary | ICD-10-CM | POA: Diagnosis not present

## 2018-02-02 DIAGNOSIS — M109 Gout, unspecified: Secondary | ICD-10-CM | POA: Diagnosis not present

## 2018-02-03 ENCOUNTER — Telehealth: Payer: Self-pay | Admitting: Internal Medicine

## 2018-02-03 NOTE — Telephone Encounter (Signed)
Patients daughter [name] Spanish - [phone])  has dropped off [employer] forms to be completed. Forms have been completed & placed in providers box to review and sign if approves.

## 2018-02-04 DIAGNOSIS — M109 Gout, unspecified: Secondary | ICD-10-CM | POA: Diagnosis not present

## 2018-02-04 DIAGNOSIS — Z48817 Encounter for surgical aftercare following surgery on the skin and subcutaneous tissue: Secondary | ICD-10-CM | POA: Diagnosis not present

## 2018-02-04 DIAGNOSIS — M48061 Spinal stenosis, lumbar region without neurogenic claudication: Secondary | ICD-10-CM | POA: Diagnosis not present

## 2018-02-04 DIAGNOSIS — E1140 Type 2 diabetes mellitus with diabetic neuropathy, unspecified: Secondary | ICD-10-CM | POA: Diagnosis not present

## 2018-02-04 DIAGNOSIS — I10 Essential (primary) hypertension: Secondary | ICD-10-CM | POA: Diagnosis not present

## 2018-02-04 DIAGNOSIS — E1151 Type 2 diabetes mellitus with diabetic peripheral angiopathy without gangrene: Secondary | ICD-10-CM | POA: Diagnosis not present

## 2018-02-04 NOTE — Telephone Encounter (Signed)
LVM for daughter to inform that they complete &ready to be picked up.   Copy sent to scan.

## 2018-02-07 DIAGNOSIS — E1140 Type 2 diabetes mellitus with diabetic neuropathy, unspecified: Secondary | ICD-10-CM | POA: Diagnosis not present

## 2018-02-07 DIAGNOSIS — Z48817 Encounter for surgical aftercare following surgery on the skin and subcutaneous tissue: Secondary | ICD-10-CM | POA: Diagnosis not present

## 2018-02-07 DIAGNOSIS — M109 Gout, unspecified: Secondary | ICD-10-CM | POA: Diagnosis not present

## 2018-02-07 DIAGNOSIS — E1151 Type 2 diabetes mellitus with diabetic peripheral angiopathy without gangrene: Secondary | ICD-10-CM | POA: Diagnosis not present

## 2018-02-07 DIAGNOSIS — M48061 Spinal stenosis, lumbar region without neurogenic claudication: Secondary | ICD-10-CM | POA: Diagnosis not present

## 2018-02-07 DIAGNOSIS — I10 Essential (primary) hypertension: Secondary | ICD-10-CM | POA: Diagnosis not present

## 2018-02-08 DIAGNOSIS — M109 Gout, unspecified: Secondary | ICD-10-CM | POA: Diagnosis not present

## 2018-02-08 DIAGNOSIS — M48061 Spinal stenosis, lumbar region without neurogenic claudication: Secondary | ICD-10-CM | POA: Diagnosis not present

## 2018-02-08 DIAGNOSIS — E1151 Type 2 diabetes mellitus with diabetic peripheral angiopathy without gangrene: Secondary | ICD-10-CM | POA: Diagnosis not present

## 2018-02-08 DIAGNOSIS — E1140 Type 2 diabetes mellitus with diabetic neuropathy, unspecified: Secondary | ICD-10-CM | POA: Diagnosis not present

## 2018-02-08 DIAGNOSIS — I10 Essential (primary) hypertension: Secondary | ICD-10-CM | POA: Diagnosis not present

## 2018-02-08 DIAGNOSIS — Z48817 Encounter for surgical aftercare following surgery on the skin and subcutaneous tissue: Secondary | ICD-10-CM | POA: Diagnosis not present

## 2018-02-10 DIAGNOSIS — E1140 Type 2 diabetes mellitus with diabetic neuropathy, unspecified: Secondary | ICD-10-CM | POA: Diagnosis not present

## 2018-02-10 DIAGNOSIS — E1151 Type 2 diabetes mellitus with diabetic peripheral angiopathy without gangrene: Secondary | ICD-10-CM | POA: Diagnosis not present

## 2018-02-10 DIAGNOSIS — M48061 Spinal stenosis, lumbar region without neurogenic claudication: Secondary | ICD-10-CM | POA: Diagnosis not present

## 2018-02-10 DIAGNOSIS — M109 Gout, unspecified: Secondary | ICD-10-CM | POA: Diagnosis not present

## 2018-02-10 DIAGNOSIS — Z48817 Encounter for surgical aftercare following surgery on the skin and subcutaneous tissue: Secondary | ICD-10-CM | POA: Diagnosis not present

## 2018-02-10 DIAGNOSIS — I10 Essential (primary) hypertension: Secondary | ICD-10-CM | POA: Diagnosis not present

## 2018-02-14 DIAGNOSIS — M48061 Spinal stenosis, lumbar region without neurogenic claudication: Secondary | ICD-10-CM | POA: Diagnosis not present

## 2018-02-14 DIAGNOSIS — E1140 Type 2 diabetes mellitus with diabetic neuropathy, unspecified: Secondary | ICD-10-CM | POA: Diagnosis not present

## 2018-02-14 DIAGNOSIS — I10 Essential (primary) hypertension: Secondary | ICD-10-CM | POA: Diagnosis not present

## 2018-02-14 DIAGNOSIS — E1151 Type 2 diabetes mellitus with diabetic peripheral angiopathy without gangrene: Secondary | ICD-10-CM | POA: Diagnosis not present

## 2018-02-14 DIAGNOSIS — Z48817 Encounter for surgical aftercare following surgery on the skin and subcutaneous tissue: Secondary | ICD-10-CM | POA: Diagnosis not present

## 2018-02-14 DIAGNOSIS — M109 Gout, unspecified: Secondary | ICD-10-CM | POA: Diagnosis not present

## 2018-02-16 DIAGNOSIS — I10 Essential (primary) hypertension: Secondary | ICD-10-CM | POA: Diagnosis not present

## 2018-02-16 DIAGNOSIS — E1151 Type 2 diabetes mellitus with diabetic peripheral angiopathy without gangrene: Secondary | ICD-10-CM | POA: Diagnosis not present

## 2018-02-16 DIAGNOSIS — Z48817 Encounter for surgical aftercare following surgery on the skin and subcutaneous tissue: Secondary | ICD-10-CM | POA: Diagnosis not present

## 2018-02-16 DIAGNOSIS — M109 Gout, unspecified: Secondary | ICD-10-CM | POA: Diagnosis not present

## 2018-02-16 DIAGNOSIS — E1140 Type 2 diabetes mellitus with diabetic neuropathy, unspecified: Secondary | ICD-10-CM | POA: Diagnosis not present

## 2018-02-16 DIAGNOSIS — M48061 Spinal stenosis, lumbar region without neurogenic claudication: Secondary | ICD-10-CM | POA: Diagnosis not present

## 2018-02-17 ENCOUNTER — Other Ambulatory Visit: Payer: Self-pay | Admitting: Internal Medicine

## 2018-02-17 DIAGNOSIS — M48061 Spinal stenosis, lumbar region without neurogenic claudication: Secondary | ICD-10-CM | POA: Diagnosis not present

## 2018-02-17 DIAGNOSIS — Z48817 Encounter for surgical aftercare following surgery on the skin and subcutaneous tissue: Secondary | ICD-10-CM | POA: Diagnosis not present

## 2018-02-17 DIAGNOSIS — M109 Gout, unspecified: Secondary | ICD-10-CM | POA: Diagnosis not present

## 2018-02-17 DIAGNOSIS — I10 Essential (primary) hypertension: Secondary | ICD-10-CM | POA: Diagnosis not present

## 2018-02-17 DIAGNOSIS — E1140 Type 2 diabetes mellitus with diabetic neuropathy, unspecified: Secondary | ICD-10-CM | POA: Diagnosis not present

## 2018-02-17 DIAGNOSIS — E1151 Type 2 diabetes mellitus with diabetic peripheral angiopathy without gangrene: Secondary | ICD-10-CM | POA: Diagnosis not present

## 2018-02-23 NOTE — Patient Instructions (Addendum)
  Tests ordered today. Your results will be released to [facility] (or called to you) after review, usually within 72hours after test completion. If any changes need to be made, you will be notified at that same time.   Medications reviewed and updated.  Changes include :   Stop the hctz  Your prescription(s) have been submitted to your pharmacy. Please take as directed and contact our office if you believe you are having problem(s) with the medication(s).   Please followup in 6 months

## 2018-02-23 NOTE — Progress Notes (Signed)
Subjective:    Patient ID: [name], female    DOB: May 13, 1930, 84 y.o.   MRN: [id number]  HPI The patient is here for follow up.  Diabetes with neuropathy: She is controlling her sugars with diet. She is compliant with a diabetic diet. She is not exercising regularly.    Hypertension, hyperlipidemia: She is taking her medication daily. She is compliant with a low sodium diet.  She denies chest pain, palpitations, edema, shortness of breath and regular headaches.    GERD:  She is taking her medication daily as prescribed.  She denies any GERD symptoms and feels her GERD is well controlled.   Depression: her cymbalta was increased to 60 mg daily by Dr Delice Lesch.  Her daughter noticed that after increasing her Cymbalta she was sleeping excessively and she is currently concerned that this may increase her risk for falls or other problems, so she decreased the Cymbalta back to 30 mg daily.  She denies any side effects from the medication.  She does get depressed at times related to her memory difficulties.  [facility] once [facility] is arranged.  She is looking forward to that.  Dementia:  She is following with Dr Delice Lesch.  She is taking donepzil and memantine.    Back pain, stenosis:  She is taking tylenol.  She also takes gabapentin at night that does help.  She has not been complaining much about back pain per her daughter.  Abscess of right foot: This has been could be resolved and has healed.  She denies any foot pain.  Cdiff: she was diagnosed on 10/3 and took flagyl.  Her stools are returning to normal and are formed.  She denies any diarrhea or abdominal pain.  She has no fevers or chills.  Medications and allergies reviewed with patient and updated if appropriate.  Patient Active Problem List   Diagnosis Date Noted  . Cellulitis of foot 12/05/2017  . Abscess of right foot 12/02/2017  . Mild dementia ([place]) 07/27/2017  .  Buttock pain 10/30/2016  . Chronic RUQ pain 05/28/2016  . Right knee pain 06/14/2015  . Diabetic neuropathy ([place]) 03/21/2014  . Bunion 03/21/2014  . PVD (pulmonary valve disease) 01/23/2014  . Onychomycosis 12/20/2013  . PVD (peripheral vascular disease) ([place]) 12/19/2013  . Gout 12/13/2013  . Depression   . Spinal stenosis of lumbar region 04/24/2013  . Arthritis of shoulder region, left 09/11/2011  . Type 2 diabetes mellitus ([place]) 01/17/2009  . Anemia 10/15/2008  . CAD, NATIVE VESSEL 06/09/2008  . Hyperlipidemia 06/15/2007  . Essential hypertension 06/15/2007  . GERD 06/15/2007  . Fibromyalgia syndrome 06/15/2007  . 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 DIVERTICULOSIS, 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    Current Outpatient Medications on File Prior to Visit  Medication Sig Dispense Refill  . amLODipine (NORVASC) 5 MG tablet Take 1 tablet (5 mg total) by mouth 2 (two) times daily. 180 tablet 3  . aspirin EC 81 MG tablet Take 81 mg by mouth daily.    Marland Kitchen atorvastatin (LIPITOR) 40 MG tablet Take 1 tablet (40 mg total) by mouth daily. 90 tablet 3  . carvedilol (COREG) 6.25 MG tablet TAKE ONE TABLET BY MOUTH TWICE A DAY WITH A MEAL (Patient taking differently: Take 6.25 mg by mouth 2 (two) times daily. ) 180 tablet 3  . cetirizine (ZYRTEC) 10 MG tablet TAKE ONE TABLET BY MOUTH DAILY (Patient taking differently: Take 10 mg by mouth  at bedtime. ) 90 tablet 3  . diclofenac sodium (VOLTAREN) 1 % GEL Apply 2 g topically 4 (four) times daily as needed (pain).    Marland Kitchen donepezil (ARICEPT) 10 MG tablet Take 1 tablet daily 90 tablet 3  . DULoxetine (CYMBALTA) 60 MG capsule Take 1 capsule (60 mg total) by mouth daily. (Patient taking differently: Take 30 mg by mouth daily. ) 90 capsule 3  . meloxicam (MOBIC) 7.5 MG tablet Take 1 tablet (7.5 mg total) by mouth daily. 90 tablet 1  . memantine (NAMENDA) 10 MG tablet Take 1 tablet (10 mg total) by mouth 2 (two) times daily. 180 tablet 3  . metroNIDAZOLE (FLAGYL) 500  MG tablet Take 1 tablet (500 mg total) by mouth 3 (three) times daily. 42 tablet 0  . omeprazole (PRILOSEC) 40 MG capsule TAKE 1 CAPSULE (40 MG TOTAL) BY MOUTH DAILY ON AN EMPTY STOMACH (Patient taking differently: Take 40 mg by mouth daily. ) 90 capsule 3  . ramipril (ALTACE) 10 MG capsule Take 1 capsule (10 mg total) by mouth 2 (two) times daily. 180 capsule 3  . hydrochlorothiazide (HYDRODIURIL) 12.5 MG tablet Take 1 tablet (12.5 mg total) by mouth daily. (Patient not taking: Reported on 02/24/2018) 90 tablet 3   No current facility-administered medications on file prior to visit.     Past Medical History:  Diagnosis Date  . CAD, NATIVE VESSEL 06/09/2008  . DIABETES MELLITUS, TYPE II   . FIBROMYALGIA   . GERD   . Gout   . HIATAL HERNIA   . HYPERLIPIDEMIA   . HYPERTENSION   . Insomnia   . OA (osteoarthritis)    severe, shoulders, hands - inflammatory, ?RA  . S/P CABG x 1   . S/P inguinal hernia repair   . TUBULOVILLOUS ADENOMA, 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   colo 04/2012 - no polyps - no further screening planned (age >43)  . UNSPECIFIED ANEMIA     Past Surgical History:  Procedure Laterality Date  . ABDOMINAL HYSTERECTOMY  1981  . APPENDECTOMY    . CATARACT EXTRACTION, BILATERAL    . CORONARY ARTERY BYPASS GRAFT  2010  . HERNIA REPAIR     INGUINAL  . I&D EXTREMITY Left 12/07/2017   Procedure: IRRIGATION AND DEBRIDEMENT EXTREMITY WITH FOREIGN BODY REMOVAL;  Surgeon: Nicholes Stairs, MD;  Location: [facility];  Service: Orthopedics;  Laterality: Left;  . JOINT REPLACEMENT    . OVARIAN CYST REMOVAL    . REPLACEMENT TOTAL KNEE BILATERAL Bilateral    Rt-1974, Lt -1989  . REVERSE SHOULDER ARTHROPLASTY  09/08/2011   Procedure: REVERSE SHOULDER ARTHROPLASTY;  Surgeon: Nita Sells, MD;  Location: [facility];  Service: Orthopedics;  Laterality: Left;  left reverse total shoulder arthroplasty  . ROTATOR CUFF REPAIR     Left    Social History   Socioeconomic History  . Marital  status: Widowed    Spouse name: Not on file  . Number of children: Not on file  . Years of education: Not on file  . Highest education level: Not on file  Occupational History  . Not on file  Social Needs  . Financial resource strain: Not on file  . Food insecurity:    Worry: Not on file    Inability: Not on file  . Transportation needs:    Medical: Not on file    Non-medical: Not on file  Tobacco Use  . Smoking status: Former Smoker    Last attempt to quit: 04/26/1970  Years since quitting: 47.8  . Smokeless tobacco: Never Used  Substance and Sexual Activity  . Alcohol use: Yes    Alcohol/week: 1.0 standard drinks    Types: 1 Glasses of wine per week  . Drug use: No  . Sexual activity: Not on file  Lifestyle  . Physical activity:    Days per week: Not on file    Minutes per session: Not on file  . Stress: Not on file  Relationships  . Social connections:    Talks on phone: Not on file    Gets together: Not on file    Attends religious service: Not on file    Active member of club or organization: Not on file    Attends meetings of clubs or organizations: Not on file    Relationship status: Not on file  Other Topics Concern  . Not on file  Social History Narrative   Widows, lives alone. 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 Cancer Mother        Stomach  . Stomach cancer Mother 10  . Hypertension Mother   . Heart disease Father 55       MI  . Diabetes Father   . Heart attack Father   . Diabetes Daughter   . Hypertension Daughter   . Vascular Disease Sister   . Stroke Sister   . Dementia Sister   . Diabetes Sister   . Parkinsonism Sister   . Stroke Sister   . Heart disease Sister   . Heart attack Brother   . Anesthesia problems Neg Hx   . Colon cancer Neg Hx     Review of Systems  Constitutional: Negative for chills and fever.  Respiratory: Negative for cough, shortness of breath and wheezing.    Cardiovascular: Negative for chest pain, palpitations and leg swelling.  Gastrointestinal: Negative for abdominal pain, diarrhea and nausea.  Neurological: Positive for light-headedness (sometimes). Negative for headaches.       Objective:   Vitals:   02/24/18 1053  BP: (!) 142/68  Pulse: (!) 58  Resp: 16  Temp: 98 F (36.7 C)  SpO2: 96%   BP Readings from Last 3 Encounters:  02/24/18 (!) 142/68  01/28/18 (!) 96/58  01/20/18 128/60   Wt Readings from Last 3 Encounters:  02/24/18 133 lb (60.3 kg)  01/28/18 133 lb (60.3 kg)  01/20/18 134 lb (60.8 kg)   Body mass index is 26.86 kg/m.   Physical Exam    Constitutional: Appears well-developed and well-nourished. No distress.  HENT:  Head: Normocephalic and atraumatic.  Neck: Neck supple. No tracheal deviation present. No thyromegaly present.  No cervical lymphadenopathy Cardiovascular: Normal rate, regular rhythm and normal heart sounds.   No murmur heard. No carotid bruit .  No edema Pulmonary/Chest: Effort normal and breath sounds normal. No respiratory distress. No has no wheezes. No rales.  Skin: Skin is warm and dry. Not diaphoretic.  Psychiatric: Normal mood and affect. Behavior is normal.      Assessment & Plan:    See Problem List for Assessment and Plan of chronic medical problems.

## 2018-02-24 ENCOUNTER — Encounter: Payer: Self-pay | Admitting: Internal Medicine

## 2018-02-24 ENCOUNTER — Ambulatory Visit (INDEPENDENT_AMBULATORY_CARE_PROVIDER_SITE_OTHER): Payer: Medicare Other | Admitting: Internal Medicine

## 2018-02-24 ENCOUNTER — Other Ambulatory Visit (INDEPENDENT_AMBULATORY_CARE_PROVIDER_SITE_OTHER): Payer: Medicare Other

## 2018-02-24 VITALS — BP 142/68 | HR 58 | Temp 98.0°F | Resp 16 | Ht 59.0 in | Wt 133.0 lb

## 2018-02-24 DIAGNOSIS — I10 Essential (primary) hypertension: Secondary | ICD-10-CM | POA: Diagnosis not present

## 2018-02-24 DIAGNOSIS — E782 Mixed hyperlipidemia: Secondary | ICD-10-CM

## 2018-02-24 DIAGNOSIS — K219 Gastro-esophageal reflux disease without esophagitis: Secondary | ICD-10-CM

## 2018-02-24 DIAGNOSIS — F0390 Unspecified dementia without behavioral disturbance: Secondary | ICD-10-CM

## 2018-02-24 DIAGNOSIS — I25810 Atherosclerosis of coronary artery bypass graft(s) without angina pectoris: Secondary | ICD-10-CM

## 2018-02-24 DIAGNOSIS — F3289 Other specified depressive episodes: Secondary | ICD-10-CM

## 2018-02-24 DIAGNOSIS — E1140 Type 2 diabetes mellitus with diabetic neuropathy, unspecified: Secondary | ICD-10-CM

## 2018-02-24 DIAGNOSIS — F03A0 Unspecified dementia, mild, without behavioral disturbance, psychotic disturbance, mood disturbance, and anxiety: Secondary | ICD-10-CM

## 2018-02-24 LAB — COMPREHENSIVE METABOLIC PANEL
ALT: 10 U/L (ref 0–35)
AST: 17 U/L (ref 0–37)
Albumin: 4.1 g/dL (ref 3.5–5.2)
Alkaline Phosphatase: 61 U/L (ref 39–117)
BUN: 15 mg/dL (ref 6–23)
CO2: 28 mEq/L (ref 19–32)
Calcium: 9.5 mg/dL (ref 8.4–10.5)
Chloride: 107 mEq/L (ref 96–112)
Creatinine, Ser: 0.74 mg/dL (ref 0.40–1.20)
GFR: 95.76 mL/min (ref >60.00)
Glucose, Bld: 109 mg/dL — ABNORMAL HIGH (ref 70–99)
Potassium: 4.1 mEq/L (ref 3.5–5.1)
Sodium: 140 mEq/L (ref 135–145)
Total Bilirubin: 1 mg/dL (ref 0.2–1.2)
Total Protein: 7 g/dL (ref 6.0–8.3)

## 2018-02-24 LAB — CBC WITH DIFFERENTIAL/PLATELET
Basophils Absolute: 0 10*3/uL (ref 0.0–0.1)
Basophils Relative: 1 % (ref 0.0–3.0)
Eosinophils Absolute: 0.2 10*3/uL (ref 0.0–0.7)
Eosinophils Relative: 3.8 % (ref 0.0–5.0)
HCT: 36.7 % (ref 36.0–46.0)
Hemoglobin: 12.1 g/dL (ref 12.0–15.0)
Lymphocytes Relative: 40.3 % (ref 12.0–46.0)
Lymphs Abs: 1.9 10*3/uL (ref 0.7–4.0)
MCHC: 33 g/dL (ref 30.0–36.0)
MCV: 95.3 fl (ref 78.0–100.0)
Monocytes Absolute: 0.5 10*3/uL (ref 0.1–1.0)
Monocytes Relative: 10 % (ref 3.0–12.0)
Neutro Abs: 2.1 10*3/uL (ref 1.4–7.7)
Neutrophils Relative %: 44.9 % (ref 43.0–77.0)
Platelets: 217 10*3/uL (ref 150.0–400.0)
RBC: 3.85 Mil/uL — ABNORMAL LOW (ref 3.87–5.11)
RDW: 15.5 % (ref 11.5–15.5)
WBC: 4.6 10*3/uL (ref 4.0–10.5)

## 2018-02-24 LAB — LIPID PANEL
Cholesterol: 167 mg/dL (ref 0–200)
HDL: 89.2 mg/dL (ref >39.00)
LDL Cholesterol: 60 mg/dL (ref 0–99)
NonHDL: 78.18
Total CHOL/HDL Ratio: 2
Triglycerides: 91 mg/dL (ref 0.0–149.0)
VLDL: 18.2 mg/dL (ref 0.0–40.0)

## 2018-02-24 LAB — HEMOGLOBIN A1C: Hgb A1c MFr Bld: 5.8 % (ref 4.6–6.5)

## 2018-02-24 MED ORDER — GABAPENTIN 300 MG PO CAPS: 300.0000 mg | ORAL_CAPSULE | Freq: Every day | ORAL | 1 refills | Status: DC

## 2018-02-24 MED ORDER — DULOXETINE HCL 30 MG PO CPEP: 30.0000 mg | ORAL_CAPSULE | Freq: Every day | ORAL | 1 refills | Status: DC

## 2018-02-24 NOTE — Assessment & Plan Note (Signed)
Diet controlled  Check a1c

## 2018-02-24 NOTE — Assessment & Plan Note (Signed)
Check lipid panel  Continue daily statin  

## 2018-02-24 NOTE — Assessment & Plan Note (Signed)
Has not been taking the hydrochlorothiazide since her episode of seizure-we will discontinue completely and monitor her blood pressure water Continue amlodipine and ramipril CMP

## 2018-02-24 NOTE — Assessment & Plan Note (Signed)
Following with Dr Delice Lesch Taking aricept and namenda

## 2018-02-24 NOTE — Assessment & Plan Note (Signed)
Depressed at times due to dementia cymbalta was increased to 60 mg but it caused excessive drowsiness and her daughter decreased it back to 30 mg  [facility] once transportation is arranged

## 2018-02-24 NOTE — Assessment & Plan Note (Signed)
GERD controlled Continue daily medication  

## 2018-04-28 DIAGNOSIS — M545 Low back pain: Secondary | ICD-10-CM | POA: Diagnosis not present

## 2018-04-28 DIAGNOSIS — M542 Cervicalgia: Secondary | ICD-10-CM | POA: Diagnosis not present

## 2018-04-28 DIAGNOSIS — M5416 Radiculopathy, lumbar region: Secondary | ICD-10-CM | POA: Diagnosis not present

## 2018-05-05 ENCOUNTER — Other Ambulatory Visit: Payer: Self-pay | Admitting: Internal Medicine

## 2018-05-05 DIAGNOSIS — Z1231 Encounter for screening mammogram for malignant neoplasm of breast: Secondary | ICD-10-CM

## 2018-05-13 DIAGNOSIS — M5416 Radiculopathy, lumbar region: Secondary | ICD-10-CM | POA: Diagnosis not present

## 2018-05-13 DIAGNOSIS — M545 Low back pain: Secondary | ICD-10-CM | POA: Diagnosis not present

## 2018-05-26 DIAGNOSIS — M542 Cervicalgia: Secondary | ICD-10-CM | POA: Diagnosis not present

## 2018-05-26 DIAGNOSIS — M5412 Radiculopathy, cervical region: Secondary | ICD-10-CM | POA: Diagnosis not present

## 2018-05-26 DIAGNOSIS — M545 Low back pain: Secondary | ICD-10-CM | POA: Diagnosis not present

## 2018-05-26 DIAGNOSIS — M5416 Radiculopathy, lumbar region: Secondary | ICD-10-CM | POA: Diagnosis not present

## 2018-06-02 ENCOUNTER — Ambulatory Visit
Admission: RE | Admit: 2018-06-02 | Discharge: 2018-06-02 | Disposition: A | Discharge status: Home / Self Care | Payer: Medicare Other | Source: Ambulatory Visit | Attending: Internal Medicine | Admitting: Internal Medicine

## 2018-06-02 DIAGNOSIS — M542 Cervicalgia: Secondary | ICD-10-CM | POA: Diagnosis not present

## 2018-06-02 DIAGNOSIS — Z1231 Encounter for screening mammogram for malignant neoplasm of breast: Secondary | ICD-10-CM | POA: Diagnosis not present

## 2018-06-02 DIAGNOSIS — M2560 Stiffness of unspecified joint, not elsewhere classified: Secondary | ICD-10-CM | POA: Diagnosis not present

## 2018-06-02 DIAGNOSIS — M5412 Radiculopathy, cervical region: Secondary | ICD-10-CM | POA: Diagnosis not present

## 2018-06-02 DIAGNOSIS — M6281 Muscle weakness (generalized): Secondary | ICD-10-CM | POA: Diagnosis not present

## 2018-06-07 DIAGNOSIS — M6281 Muscle weakness (generalized): Secondary | ICD-10-CM | POA: Diagnosis not present

## 2018-06-07 DIAGNOSIS — M542 Cervicalgia: Secondary | ICD-10-CM | POA: Diagnosis not present

## 2018-06-07 DIAGNOSIS — M5412 Radiculopathy, cervical region: Secondary | ICD-10-CM | POA: Diagnosis not present

## 2018-06-07 DIAGNOSIS — M2560 Stiffness of unspecified joint, not elsewhere classified: Secondary | ICD-10-CM | POA: Diagnosis not present

## 2018-06-09 DIAGNOSIS — M2560 Stiffness of unspecified joint, not elsewhere classified: Secondary | ICD-10-CM | POA: Diagnosis not present

## 2018-06-09 DIAGNOSIS — M5412 Radiculopathy, cervical region: Secondary | ICD-10-CM | POA: Diagnosis not present

## 2018-06-09 DIAGNOSIS — M6281 Muscle weakness (generalized): Secondary | ICD-10-CM | POA: Diagnosis not present

## 2018-06-09 DIAGNOSIS — M542 Cervicalgia: Secondary | ICD-10-CM | POA: Diagnosis not present

## 2018-06-14 DIAGNOSIS — M6281 Muscle weakness (generalized): Secondary | ICD-10-CM | POA: Diagnosis not present

## 2018-06-14 DIAGNOSIS — M5412 Radiculopathy, cervical region: Secondary | ICD-10-CM | POA: Diagnosis not present

## 2018-06-14 DIAGNOSIS — M2560 Stiffness of unspecified joint, not elsewhere classified: Secondary | ICD-10-CM | POA: Diagnosis not present

## 2018-06-14 DIAGNOSIS — M542 Cervicalgia: Secondary | ICD-10-CM | POA: Diagnosis not present

## 2018-06-16 DIAGNOSIS — M6281 Muscle weakness (generalized): Secondary | ICD-10-CM | POA: Diagnosis not present

## 2018-06-16 DIAGNOSIS — M5412 Radiculopathy, cervical region: Secondary | ICD-10-CM | POA: Diagnosis not present

## 2018-06-16 DIAGNOSIS — M2560 Stiffness of unspecified joint, not elsewhere classified: Secondary | ICD-10-CM | POA: Diagnosis not present

## 2018-06-16 DIAGNOSIS — M542 Cervicalgia: Secondary | ICD-10-CM | POA: Diagnosis not present

## 2018-07-14 ENCOUNTER — Other Ambulatory Visit: Payer: Self-pay | Admitting: Internal Medicine

## 2018-07-14 DIAGNOSIS — I25810 Atherosclerosis of coronary artery bypass graft(s) without angina pectoris: Secondary | ICD-10-CM

## 2018-07-14 DIAGNOSIS — E785 Hyperlipidemia, unspecified: Secondary | ICD-10-CM

## 2018-08-20 ENCOUNTER — Other Ambulatory Visit: Payer: Self-pay | Admitting: Internal Medicine

## 2018-08-24 NOTE — Progress Notes (Signed)
Virtual Visit via Video Note  I connected with [name] on 08/25/18 at 11:00 AM EDT by a video enabled telemedicine application and verified that I am speaking with the correct person using two identifiers.   I discussed the limitations of evaluation and management by telemedicine and the availability of in person appointments. The patient expressed understanding and agreed to proceed.  The patient is currently at home and I am in the office.    No referring provider.    History of Present Illness: She is here for follow up of her chronic medical conditions.   She is not exercising regularly.    Diabetes, diabetic neuropathy:  She is controlling her sugars with diet.  She is compliant with a diabetic diet.   Hypertension: She is taking her medication daily. She is compliant with a low sodium diet.  She denies chest pain, palpitations, edema, shortness of breath and regular headaches. She does not monitor her blood pressure at home.    Hyperlipidemia: She is taking her medication daily. She is compliant with a low fat/cholesterol diet. She denies myalgias.   GERD:  She is taking her medication daily as prescribed.  She denies any GERD symptoms and feels her GERD is well controlled.   Depression: She is taking her medication daily as prescribed. She denies any side effects from the medication. She feels her depression is well controlled and she is happy with her current dose of medication.   Dementia:  She is following with Dr Delice Lesch.  She is taking her medications daily.   Chronic back pain, stenosis:  She is taking tylenol and gabapentin.  She also uses Voltaren gel as needed.  She is following with orthopedics.    Review of Systems  Constitutional: Negative for fever.  Respiratory: Negative for cough, shortness of breath and wheezing.   Cardiovascular: Negative for chest pain, palpitations and leg swelling.  Gastrointestinal: Negative for abdominal pain and heartburn.   Neurological: Negative for dizziness and headaches.      Social History   Socioeconomic History  . Marital status: Widowed    Spouse name: Not on file  . Number of children: Not on file  . Years of education: Not on file  . Highest education level: Not on file  Occupational History  . Not on file  Social Needs  . Financial resource strain: Not on file  . Food insecurity:    Worry: Not on file    Inability: Not on file  . Transportation needs:    Medical: Not on file    Non-medical: Not on file  Tobacco Use  . Smoking status: Former Smoker    Last attempt to quit: 04/26/1970    Years since quitting: 48.3  . Smokeless tobacco: Never Used  Substance and Sexual Activity  . Alcohol use: Yes    Alcohol/week: 1.0 standard drinks    Types: 1 Glasses of wine per week  . Drug use: No  . Sexual activity: Not on file  Lifestyle  . Physical activity:    Days per week: Not on file    Minutes per session: Not on file  . Stress: Not on file  Relationships  . Social connections:    Talks on phone: Not on file    Gets together: Not on file    Attends religious service: Not on file    Active member of club or organization: Not on file    Attends meetings of clubs or organizations: Not on  file    Relationship status: Not on file  Other Topics Concern  . Not on file  Social History Narrative   Widows, lives alone. 2 sons. 2 daughters. Retired Ambulance person     Observations/Objective: Appears well in NAD Normal mood and affect  Assessment and Plan:  See Problem List for Assessment and Plan of chronic medical problems.   Follow Up Instructions:    I discussed the assessment and treatment plan with the patient. The patient was provided an opportunity to ask questions and all were answered. The patient agreed with the plan and demonstrated an understanding of the instructions.   The patient was advised to call back or seek an in-person evaluation if the symptoms worsen or if  the condition fails to improve as anticipated.  Follow-up in 6 months  Binnie Rail, MD

## 2018-08-25 ENCOUNTER — Ambulatory Visit: Payer: Medicare Other | Admitting: Internal Medicine

## 2018-08-25 ENCOUNTER — Encounter: Payer: Self-pay | Admitting: Internal Medicine

## 2018-08-25 ENCOUNTER — Ambulatory Visit (INDEPENDENT_AMBULATORY_CARE_PROVIDER_SITE_OTHER): Payer: Medicare Other | Admitting: Internal Medicine

## 2018-08-25 DIAGNOSIS — E0841 Diabetes mellitus due to underlying condition with diabetic mononeuropathy: Secondary | ICD-10-CM

## 2018-08-25 DIAGNOSIS — M48061 Spinal stenosis, lumbar region without neurogenic claudication: Secondary | ICD-10-CM | POA: Diagnosis not present

## 2018-08-25 DIAGNOSIS — F03A0 Unspecified dementia, mild, without behavioral disturbance, psychotic disturbance, mood disturbance, and anxiety: Secondary | ICD-10-CM

## 2018-08-25 DIAGNOSIS — I25810 Atherosclerosis of coronary artery bypass graft(s) without angina pectoris: Secondary | ICD-10-CM

## 2018-08-25 DIAGNOSIS — I10 Essential (primary) hypertension: Secondary | ICD-10-CM

## 2018-08-25 DIAGNOSIS — F0390 Unspecified dementia without behavioral disturbance: Secondary | ICD-10-CM

## 2018-08-25 DIAGNOSIS — K219 Gastro-esophageal reflux disease without esophagitis: Secondary | ICD-10-CM

## 2018-08-25 DIAGNOSIS — F3289 Other specified depressive episodes: Secondary | ICD-10-CM | POA: Diagnosis not present

## 2018-08-25 DIAGNOSIS — E1140 Type 2 diabetes mellitus with diabetic neuropathy, unspecified: Secondary | ICD-10-CM

## 2018-08-25 DIAGNOSIS — E782 Mixed hyperlipidemia: Secondary | ICD-10-CM | POA: Diagnosis not present

## 2018-08-25 MED ORDER — CARVEDILOL 6.25 MG PO TABS: ORAL_TABLET | ORAL | 1 refills | Status: DC

## 2018-08-25 MED ORDER — GABAPENTIN 300 MG PO CAPS: 300.0000 mg | ORAL_CAPSULE | Freq: Every day | ORAL | 1 refills | Status: DC

## 2018-08-25 MED ORDER — AMLODIPINE BESYLATE 5 MG PO TABS: 5.0000 mg | ORAL_TABLET | Freq: Two times a day (BID) | ORAL | 1 refills | Status: DC

## 2018-08-25 MED ORDER — RAMIPRIL 10 MG PO CAPS: 10.0000 mg | ORAL_CAPSULE | Freq: Two times a day (BID) | ORAL | 1 refills | Status: DC

## 2018-08-25 MED ORDER — DULOXETINE HCL 30 MG PO CPEP: 30.0000 mg | ORAL_CAPSULE | Freq: Every day | ORAL | 1 refills | Status: DC

## 2018-08-25 MED ORDER — MELOXICAM 7.5 MG PO TABS: 7.5000 mg | ORAL_TABLET | Freq: Every day | ORAL | 1 refills | Status: DC

## 2018-08-25 MED ORDER — ATORVASTATIN CALCIUM 40 MG PO TABS: 40.0000 mg | ORAL_TABLET | Freq: Every day | ORAL | 3 refills | Status: DC

## 2018-08-25 MED ORDER — DICLOFENAC SODIUM 1 % TD GEL: 2.0000 g | Freq: Four times a day (QID) | TRANSDERMAL | 5 refills | Status: DC | PRN

## 2018-08-25 MED ORDER — OMEPRAZOLE 40 MG PO CPDR: DELAYED_RELEASE_CAPSULE | ORAL | 1 refills | Status: DC

## 2018-08-25 NOTE — Assessment & Plan Note (Signed)
Blood pressure has been well controlled in the past Her daughter states she will try to check it on occasion to make sure it is still controlled Continue current medications at current doses Refill sent to pharmacy

## 2018-08-25 NOTE — Assessment & Plan Note (Signed)
With chronic lower back pain Following with orthopedics Taking Tylenol Uses Voltaren gel Also on gabapentin for neuropathy

## 2018-08-25 NOTE — Assessment & Plan Note (Signed)
Sugars have been diet controlled  Lab Results  Component Value Date   HGBA1C 5.8 02/24/2018   We will continue to monitor-we will do blood work at her next visit here since I am not concerned about her sugars at this time

## 2018-08-25 NOTE — Assessment & Plan Note (Signed)
Controlled, stable Continue current dose of medication  

## 2018-08-25 NOTE — Assessment & Plan Note (Signed)
Following with Dr. Delice Lesch Continue Aricept and [place]

## 2018-08-25 NOTE — Assessment & Plan Note (Signed)
GERD controlled Continue daily medication  

## 2018-08-25 NOTE — Assessment & Plan Note (Signed)
Taking Cymbalta and gabapentin Fairly controlled Continue above

## 2018-08-25 NOTE — Assessment & Plan Note (Signed)
Continue atorvastatin

## 2018-09-08 ENCOUNTER — Telehealth (INDEPENDENT_AMBULATORY_CARE_PROVIDER_SITE_OTHER): Payer: Medicare Other | Admitting: Internal Medicine

## 2018-09-08 ENCOUNTER — Telehealth: Payer: Self-pay

## 2018-09-08 ENCOUNTER — Encounter: Payer: Self-pay | Admitting: Internal Medicine

## 2018-09-08 ENCOUNTER — Other Ambulatory Visit: Payer: Self-pay

## 2018-09-08 VITALS — BP 179/71 | HR 67 | Ht 59.0 in | Wt 145.0 lb

## 2018-09-08 DIAGNOSIS — I10 Essential (primary) hypertension: Secondary | ICD-10-CM

## 2018-09-08 DIAGNOSIS — I2510 Atherosclerotic heart disease of native coronary artery without angina pectoris: Secondary | ICD-10-CM

## 2018-09-08 DIAGNOSIS — E782 Mixed hyperlipidemia: Secondary | ICD-10-CM

## 2018-09-08 NOTE — Telephone Encounter (Signed)

## 2018-09-08 NOTE — Patient Instructions (Signed)
Medication Instructions:  No change If you need a refill on your cardiac medications before your next appointment, please call your pharmacy.   Lab work: none If you have labs (blood work) drawn today and your tests are completely normal, you will receive your results only by: Marland Kitchen MyChart Message (if you have MyChart) OR . A paper copy in the mail If you have any lab test that is abnormal or we need to change your treatment, we will call you to review the results.  Testing/Procedures: none  Follow-Up: At [facility], you and your health needs are our priority.  As part of our continuing mission to provide you with exceptional heart care, we have created designated Provider Care Teams.  These Care Teams include your primary Cardiologist (physician) and Advanced Practice Providers (APPs -  Physician Assistants and Nurse Practitioners) who all work together to provide you with the care you need, when you need it. You will need a follow up appointment in:  6 months.  Please call our office 2 months in advance to schedule this appointment.  You may see Dorris Carnes, MD or one of the following Advanced Practice Providers on your designated Care Team: Richardson Dopp, PA-C [place], [place] . Daune Perch, NP  Any Other Special Instructions Will Be Listed Below (If Applicable).

## 2018-09-08 NOTE — Progress Notes (Signed)
Virtual Visit via Telephone Note   This visit type was conducted due to national recommendations for restrictions regarding the COVID-19 Pandemic (e.g. social distancing) in an effort to limit this patient's exposure and mitigate transmission in our community.  Due to her co-morbid illnesses, this patient is at least at moderate risk for complications without adequate follow up.  This format is felt to be most appropriate for this patient at this time.  The patient did not have access to video technology/had technical difficulties with video requiring transitioning to audio format only (telephone).  All issues noted in this document were discussed and addressed.  No physical exam could be performed with this format.  Please refer to the patient's chart for her  consent to telehealth for [facility].   Date:  09/08/2018   ID:  [name], DOB 11-04-1930, MRN [id number]  Patient Location: Home Provider Location: Home  PCP:  Binnie Rail, MD  Cardiologist:  Dorris Carnes, MD  Electrophysiologist:  None   Evaluation Performed:  Follow-Up Visit  Chief Complaint:  F/U of CAD   History of Present Illness:    [name] is a 85 y.o. female with hx of CAD (s/p CABG in 2010), HTN, HL and fibromyalgia.   Last myovue was in Nov 2018   Neg for ischemia    I saw her in clinic in Sept 2019   DOing OK at that time    The pt denies CP  Breathing is OK  No dizziness   No edema   Appetite is OK   SOme arthritis pains    The patient does not have symptoms concerning for COVID-19 infection (fever, chills, cough, or new shortness of breath).    Past Medical History:  Diagnosis Date  . CAD, NATIVE VESSEL 06/09/2008  . DIABETES MELLITUS, TYPE II   . FIBROMYALGIA   . GERD   . Gout   . HIATAL HERNIA   . HYPERLIPIDEMIA   . HYPERTENSION   . Insomnia   . OA (osteoarthritis)    severe, shoulders, hands - inflammatory, ?RA  . S/P CABG x 1   . S/P inguinal hernia repair   .  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 UNSPECIFIED ANEMIA    Past Surgical History:  Procedure Laterality Date  . ABDOMINAL HYSTERECTOMY  1981  . APPENDECTOMY    . CATARACT EXTRACTION, BILATERAL    . CORONARY ARTERY BYPASS GRAFT  2010  . HERNIA REPAIR     INGUINAL  . I&D EXTREMITY Left 12/07/2017   Procedure: IRRIGATION AND DEBRIDEMENT EXTREMITY WITH FOREIGN BODY REMOVAL;  Surgeon: Nicholes Stairs, MD;  Location: [facility];  Service: Orthopedics;  Laterality: Left;  . JOINT REPLACEMENT    . OVARIAN CYST REMOVAL    . REPLACEMENT TOTAL KNEE BILATERAL Bilateral    Rt-1974, Lt -1989  . REVERSE SHOULDER ARTHROPLASTY  09/08/2011   Procedure: REVERSE SHOULDER ARTHROPLASTY;  Surgeon: Nita Sells, MD;  Location: [facility];  Service: Orthopedics;  Laterality: Left;  left reverse total shoulder arthroplasty  . ROTATOR CUFF REPAIR     Left     No outpatient medications have been marked as taking for the 09/08/18 encounter (Appointment) with Fay Records, MD.     Allergies:   Patient has no known allergies.   Social History   Tobacco Use  . Smoking status: Former Smoker    Last  attempt to quit: 04/26/1970    Years since quitting: 48.4  . Smokeless tobacco: Never Used  Substance Use Topics  . Alcohol use: Yes    Alcohol/week: 1.0 standard drinks    Types: 1 Glasses of wine per week  . Drug use: No     Family Hx: The patient's family history includes Cancer in her mother; Dementia in her sister; Diabetes in her daughter, father, and sister; Heart attack in her brother and father; Heart disease in her sister; Heart disease (age of onset: 59) in her father; Hypertension in her daughter and mother; Parkinsonism in her sister; Stomach cancer (age of onset: 80) in her mother; Stroke in her sister and sister; Vascular Disease in her sister. There is no history of Anesthesia problems or Colon cancer.  ROS:   Please  see the history of present illness.     All other systems reviewed and are negative.   Prior CV studies:   The following studies were reviewed today:    Labs/Other Tests and Data Reviewed:    EKG:  No ECG reviewed.  Recent Labs: 02/24/2018: ALT 10; BUN 15; Creatinine, Ser 0.74; Hemoglobin 12.1; Platelets 217.0; Potassium 4.1; Sodium 140   Recent Lipid Panel Lab Results  Component Value Date/Time   CHOL 167 02/24/2018 11:44 AM   TRIG 91.0 02/24/2018 11:44 AM   HDL 89.20 02/24/2018 11:44 AM   CHOLHDL 2 02/24/2018 11:44 AM   LDLCALC 60 02/24/2018 11:44 AM   LDLDIRECT 115.3 09/30/2009 10:59 AM    Wt Readings from Last 3 Encounters:  02/24/18 133 lb (60.3 kg)  01/28/18 133 lb (60.3 kg)  01/20/18 134 lb (60.8 kg)     Objective:    Vital Signs:  There were no vitals taken for this visit.   BP check wrist cuff   127/61    ASSESSMENT & PLAN:    1. CAD  No symptoms to sugg angina    2  HL   Lipids were excellent in Nov 2019   Continue  3   HTN   BP OK at home check   COntinue meds   COVID-19 Education: The signs and symptoms of COVID-19 were discussed with the patient and how to seek care for testing (follow up with PCP or arrange E-visit).  The importance of social distancing was discussed today.  Time:   Today, I have spent 15 minutes with the patient with telehealth technology discussing the above problems.     Medication Adjustments/Labs and Tests Ordered: Current medicines are reviewed at length with the patient today.  Concerns regarding medicines are outlined above.   Tests Ordered: No orders of the defined types were placed in this encounter.   Medication Changes: No orders of the defined types were placed in this encounter.   Disposition:  Follow up end of october november 2020  Signed, Dorris Carnes, MD  09/08/2018 10:25 AM    [place]

## 2018-09-21 ENCOUNTER — Encounter: Payer: Self-pay | Admitting: Neurology

## 2018-09-22 DIAGNOSIS — M5416 Radiculopathy, lumbar region: Secondary | ICD-10-CM | POA: Diagnosis not present

## 2018-09-22 DIAGNOSIS — M545 Low back pain: Secondary | ICD-10-CM | POA: Diagnosis not present

## 2018-09-26 ENCOUNTER — Ambulatory Visit: Payer: Medicare Other | Admitting: Neurology

## 2018-10-20 ENCOUNTER — Other Ambulatory Visit: Payer: Self-pay

## 2018-10-20 ENCOUNTER — Telehealth (INDEPENDENT_AMBULATORY_CARE_PROVIDER_SITE_OTHER): Payer: Medicare Other | Admitting: Neurology

## 2018-10-20 ENCOUNTER — Encounter: Payer: Self-pay | Admitting: Neurology

## 2018-10-20 DIAGNOSIS — F0390 Unspecified dementia without behavioral disturbance: Secondary | ICD-10-CM

## 2018-10-20 DIAGNOSIS — F03A0 Unspecified dementia, mild, without behavioral disturbance, psychotic disturbance, mood disturbance, and anxiety: Secondary | ICD-10-CM

## 2018-10-20 MED ORDER — DONEPEZIL HCL 10 MG PO TABS: ORAL_TABLET | ORAL | 3 refills | Status: DC

## 2018-10-20 MED ORDER — MEMANTINE HCL 10 MG PO TABS: 10.0000 mg | ORAL_TABLET | Freq: Two times a day (BID) | ORAL | 3 refills | Status: DC
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
## 2018-11-18 ENCOUNTER — Other Ambulatory Visit: Payer: Self-pay

## 2018-11-18 ENCOUNTER — Emergency Department (HOSPITAL_COMMUNITY)
Admission: EM | Admit: 2018-11-18 | Discharge: 2018-11-18 | Disposition: A | Discharge status: Home / Self Care | Payer: Medicare Other | Attending: Emergency Medicine | Admitting: Emergency Medicine

## 2018-11-18 ENCOUNTER — Emergency Department (HOSPITAL_COMMUNITY): Payer: Medicare Other

## 2018-11-18 ENCOUNTER — Encounter (HOSPITAL_COMMUNITY): Payer: Self-pay

## 2018-11-18 DIAGNOSIS — E1151 Type 2 diabetes mellitus with diabetic peripheral angiopathy without gangrene: Secondary | ICD-10-CM | POA: Insufficient documentation

## 2018-11-18 DIAGNOSIS — R350 Frequency of micturition: Secondary | ICD-10-CM | POA: Insufficient documentation

## 2018-11-18 DIAGNOSIS — E1140 Type 2 diabetes mellitus with diabetic neuropathy, unspecified: Secondary | ICD-10-CM | POA: Diagnosis not present

## 2018-11-18 DIAGNOSIS — Z7982 Long term (current) use of aspirin: Secondary | ICD-10-CM | POA: Diagnosis not present

## 2018-11-18 DIAGNOSIS — I25110 Atherosclerotic heart disease of native coronary artery with unstable angina pectoris: Secondary | ICD-10-CM | POA: Diagnosis not present

## 2018-11-18 DIAGNOSIS — R4020 Unspecified coma: Secondary | ICD-10-CM | POA: Diagnosis not present

## 2018-11-18 DIAGNOSIS — Z87891 Personal history of nicotine dependence: Secondary | ICD-10-CM | POA: Diagnosis not present

## 2018-11-18 DIAGNOSIS — M545 Low back pain: Secondary | ICD-10-CM | POA: Diagnosis not present

## 2018-11-18 DIAGNOSIS — Z96653 Presence of artificial knee joint, bilateral: Secondary | ICD-10-CM | POA: Diagnosis not present

## 2018-11-18 DIAGNOSIS — Z79899 Other long term (current) drug therapy: Secondary | ICD-10-CM | POA: Diagnosis not present

## 2018-11-18 DIAGNOSIS — M5441 Lumbago with sciatica, right side: Secondary | ICD-10-CM | POA: Insufficient documentation

## 2018-11-18 DIAGNOSIS — G8929 Other chronic pain: Secondary | ICD-10-CM | POA: Insufficient documentation

## 2018-11-18 DIAGNOSIS — R52 Pain, unspecified: Secondary | ICD-10-CM | POA: Diagnosis not present

## 2018-11-18 DIAGNOSIS — F0390 Unspecified dementia without behavioral disturbance: Secondary | ICD-10-CM | POA: Diagnosis not present

## 2018-11-18 DIAGNOSIS — I10 Essential (primary) hypertension: Secondary | ICD-10-CM | POA: Diagnosis not present

## 2018-11-18 LAB — BASIC METABOLIC PANEL
Anion gap: 9 (ref 5–15)
BUN: 18 mg/dL (ref 8–23)
CO2: 26 mmol/L (ref 22–32)
Calcium: 9.2 mg/dL (ref 8.9–10.3)
Chloride: 105 mmol/L (ref 98–111)
Creatinine, Ser: 0.77 mg/dL (ref 0.44–1.00)
GFR calc Af Amer: >60 mL/min (ref >60)
GFR calc non Af Amer: >60 mL/min (ref >60)
Glucose, Bld: 107 mg/dL — ABNORMAL HIGH (ref 70–99)
Potassium: 4.8 mmol/L (ref 3.5–5.1)
Sodium: 140 mmol/L (ref 135–145)

## 2018-11-18 LAB — CBC WITH DIFFERENTIAL/PLATELET
Abs Immature Granulocytes: 0.01 10*3/uL (ref 0.00–0.07)
Basophils Absolute: 0 10*3/uL (ref 0.0–0.1)
Basophils Relative: 1 %
Eosinophils Absolute: 0.3 10*3/uL (ref 0.0–0.5)
Eosinophils Relative: 6 %
HCT: 37.8 % (ref 36.0–46.0)
Hemoglobin: 11.8 g/dL — ABNORMAL LOW (ref 12.0–15.0)
Immature Granulocytes: 0 %
Lymphocytes Relative: 40 %
Lymphs Abs: 2.1 10*3/uL (ref 0.7–4.0)
MCH: 28.9 pg (ref 26.0–34.0)
MCHC: 31.2 g/dL (ref 30.0–36.0)
MCV: 92.6 fL (ref 80.0–100.0)
Monocytes Absolute: 0.4 10*3/uL (ref 0.1–1.0)
Monocytes Relative: 7 %
Neutro Abs: 2.4 10*3/uL (ref 1.7–7.7)
Neutrophils Relative %: 46 %
Platelets: 189 10*3/uL (ref 150–400)
RBC: 4.08 MIL/uL (ref 3.87–5.11)
RDW: 15.1 % (ref 11.5–15.5)
WBC: 5.2 10*3/uL (ref 4.0–10.5)
nRBC: 0 % (ref 0.0–0.2)

## 2018-11-18 LAB — URINALYSIS, ROUTINE W REFLEX MICROSCOPIC
Bilirubin Urine: NEGATIVE
Glucose, UA: NEGATIVE mg/dL
Hgb urine dipstick: NEGATIVE
Ketones, ur: NEGATIVE mg/dL
Leukocytes,Ua: NEGATIVE
Nitrite: NEGATIVE
Protein, ur: NEGATIVE mg/dL
Specific Gravity, Urine: 1.005 (ref 1.005–1.030)
pH: 7 (ref 5.0–8.0)

## 2018-11-18 MED ORDER — ACETAMINOPHEN 500 MG PO TABS
1000.0000 mg | ORAL_TABLET | Freq: Once | ORAL | Status: AC
  Administered 2018-11-18: 1000 mg via ORAL
  Filled 2018-11-18: qty 2

## 2018-11-18 MED ORDER — TRAMADOL HCL 50 MG PO TABS
50.0000 mg | ORAL_TABLET | Freq: Once | ORAL | Status: DC
  Filled 2018-11-18: qty 1

## 2018-11-18 NOTE — ED Triage Notes (Signed)
Pt BIB EMS from home. Pt has hx of chronic back pain. Pt reports having so cramping today in her neck and back.

## 2018-11-18 NOTE — ED Notes (Signed)
Patient transported to CT 

## 2018-11-18 NOTE — ED Notes (Signed)
Patient transported to X-ray 

## 2018-11-18 NOTE — ED Notes (Signed)
Pt ambulatory to bathroom to provide a sample.

## 2018-11-18 NOTE — ED Provider Notes (Signed)
[facility] DEPT Provider Note   CSN: [id number] Arrival date & time: 11/18/18  1145    History   Chief Complaint Chief Complaint  Patient presents with   Back Pain    HPI [name] is a 85 y.o. female.     HPI Patient has a history of mild dementia.  She also has chronic spinal stenosis and low back pain.  History augmented by daughter.  Patient's pain is worsened in the mornings.  States the pain radiates down the back of her right leg.  She denies any recent falls or trauma.  She is further followed by Dr. Percell Miller for which she gets occasional steroid injections.  No known recent fever or chills.  No focal weakness or numbness.  Patient does have urinary frequency.  She takes Tylenol and Mobic at home for pain control.  Does not tolerate Ultram well. Past Medical History:  Diagnosis Date   CAD, NATIVE VESSEL 06/09/2008   DIABETES MELLITUS, TYPE II    FIBROMYALGIA    GERD    Gout    HIATAL HERNIA    HYPERLIPIDEMIA    HYPERTENSION    Insomnia    OA (osteoarthritis)    severe, shoulders, hands - inflammatory, ?RA   S/P CABG x 1    S/P inguinal hernia repair    TUBULOVILLOUS ADENOMA, 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   colo 04/2012 - no polyps - no further screening planned (age >31)   UNSPECIFIED ANEMIA     Patient Active Problem List   Diagnosis Date Noted   Cellulitis of foot 12/05/2017   Abscess of right foot 12/02/2017   Mild dementia ([place]) 07/27/2017   Buttock pain 10/30/2016   Chronic RUQ pain 05/28/2016   Right knee pain 06/14/2015   Diabetic neuropathy ([place]) 03/21/2014   Bunion 03/21/2014   PVD (pulmonary valve disease) 01/23/2014   Onychomycosis 12/20/2013   PVD (peripheral vascular disease) ([place]) 12/19/2013   Gout 12/13/2013   Depression    Spinal stenosis of lumbar region 04/24/2013   Arthritis of shoulder region, left 09/11/2011   Type 2 diabetes mellitus ([place]) 01/17/2009   Anemia 10/15/2008     CAD, NATIVE VESSEL 06/09/2008   Hyperlipidemia 06/15/2007   Essential hypertension 06/15/2007   GERD 06/15/2007   Fibromyalgia syndrome 06/15/2007   TUBULOVILLOUS ADENOMA, 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   DIVERTICULOSIS, 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    Past Surgical History:  Procedure Laterality Date   ABDOMINAL HYSTERECTOMY  1981   APPENDECTOMY     CATARACT EXTRACTION, BILATERAL     CORONARY ARTERY BYPASS GRAFT  2010   HERNIA REPAIR     INGUINAL   I&D EXTREMITY Left 12/07/2017   Procedure: IRRIGATION AND DEBRIDEMENT EXTREMITY WITH FOREIGN BODY REMOVAL;  Surgeon: Nicholes Stairs, MD;  Location: [facility];  Service: Orthopedics;  Laterality: Left;   JOINT REPLACEMENT     OVARIAN CYST REMOVAL     REPLACEMENT TOTAL KNEE BILATERAL Bilateral    Rt-1974, Lt -1989   REVERSE SHOULDER ARTHROPLASTY  09/08/2011   Procedure: REVERSE SHOULDER ARTHROPLASTY;  Surgeon: Nita Sells, MD;  Location: [facility];  Service: Orthopedics;  Laterality: Left;  left reverse total shoulder arthroplasty   ROTATOR CUFF REPAIR     Left     OB History   No obstetric history on file.      Home Medications    Prior to Admission medications   Medication Sig Start Date End Date Taking? Authorizing Provider  acetaminophen (TYLENOL) 500 MG tablet Take 500  mg by mouth every 6 (six) hours as needed.    [provider]  amLODipine (NORVASC) 5 MG tablet Take 1 tablet (5 mg total) by mouth 2 (two) times daily. 08/25/18   Binnie Rail, MD  aspirin EC 81 MG tablet Take 81 mg by mouth daily.    [provider]  atorvastatin (LIPITOR) 40 MG tablet Take 1 tablet (40 mg total) by mouth daily. 08/25/18   Binnie Rail, MD  carvedilol (COREG) 6.25 MG tablet TAKE 1 TABLET BY MOUTH  TWO TIMES A DAY 08/25/18   Burns, Claudina Lick, MD  cetirizine (ZYRTEC) 10 MG tablet TAKE ONE TABLET BY MOUTH DAILY Patient taking differently: Take 10 mg by mouth at bedtime.  02/22/17   Binnie Rail, MD  diclofenac sodium  (VOLTAREN) 1 % GEL Apply 2 g topically 4 (four) times daily as needed (pain). 08/25/18   Binnie Rail, MD  donepezil (ARICEPT) 10 MG tablet Take 1 tablet daily 10/20/18   Cameron Sprang, MD  DULoxetine (CYMBALTA) 60 MG capsule Take 60 mg by mouth daily. 08/17/18   [provider]  gabapentin (NEURONTIN) 300 MG capsule Take 1 capsule (300 mg total) by mouth at bedtime. 08/25/18   Binnie Rail, MD  meloxicam (MOBIC) 7.5 MG tablet Take 1 tablet (7.5 mg total) by mouth daily. 08/25/18   Binnie Rail, MD  memantine (NAMENDA) 10 MG tablet Take 1 tablet (10 mg total) by mouth 2 (two) times daily. 10/20/18   Cameron Sprang, MD  Multiple Vitamin (MULTIVITAMIN) tablet Take 1 tablet by mouth daily.    [provider]  Multiple Vitamins-Minerals (ZINC PO) Take 1 tablet by mouth daily.    [provider]  omeprazole (PRILOSEC) 40 MG capsule TAKE 1 CAPSULE(S) BY MOUTH DAILY 08/25/18   Binnie Rail, MD  ramipril (ALTACE) 10 MG capsule Take 1 capsule (10 mg total) by mouth 2 (two) times daily. 08/25/18   Binnie Rail, MD    Family History Family History  Problem Relation Age of Onset   Cancer Mother        Stomach   Stomach cancer Mother 43   Hypertension Mother    Heart disease Father 66       MI   Diabetes Father    Heart attack Father    Diabetes Daughter    Hypertension Daughter    Vascular Disease Sister    Stroke Sister    Dementia Sister    Diabetes Sister    Parkinsonism Sister    Stroke Sister    Heart disease Sister    Heart attack Brother    Anesthesia problems Neg Hx    Colon cancer Neg Hx     Social History Social History   Tobacco Use   Smoking status: Former Smoker    Quit date: 04/26/1970    Years since quitting: 48.5   Smokeless tobacco: Never Used  Substance Use Topics   Alcohol use: Yes    Alcohol/week: 1.0 standard drinks    Types: 1 Glasses of wine per week   Drug use: No     Allergies   Patient has no known  allergies.   Review of Systems Review of Systems  Constitutional: Negative for chills and fever.  Respiratory: Negative for cough.   Cardiovascular: Negative for chest pain.  Gastrointestinal: Negative for abdominal pain, diarrhea, nausea and vomiting.  Genitourinary: Positive for frequency.  Musculoskeletal: Positive for back pain.  Skin: Negative  for rash and wound.  Neurological: Negative for dizziness, weakness, light-headedness, numbness and headaches.     Physical Exam Updated Vital Signs BP (!) 160/77    Pulse 74    Temp 98.6 F (37 C) (Oral)    Resp 16    SpO2 95%   Physical Exam Vitals signs and nursing note reviewed.  Constitutional:      General: She is not in acute distress.    Appearance: Normal appearance. She is well-developed. She is not ill-appearing.  HENT:     Head: Normocephalic and atraumatic.  Eyes:     Pupils: Pupils are equal, round, and reactive to light.  Neck:     Musculoskeletal: Normal range of motion and neck supple.  Cardiovascular:     Rate and Rhythm: Normal rate and regular rhythm.     Heart sounds: No murmur. No friction rub. No gallop.   Pulmonary:     Effort: Pulmonary effort is normal. No respiratory distress.     Breath sounds: Normal breath sounds. No stridor. No wheezing, rhonchi or rales.  Chest:     Chest wall: No tenderness.  Abdominal:     General: Bowel sounds are normal.     Palpations: Abdomen is soft.     Tenderness: There is no abdominal tenderness. There is no right CVA tenderness, left CVA tenderness, guarding or rebound.  Musculoskeletal: Normal range of motion.        General: Tenderness present.     Comments: Patient has superior midline lumbar tenderness to palpation.  No definite step-offs or deformities.  2+ dorsalis pedis and posterior tibial pulses in bilateral lower extremities.  Negative straight leg raise.  Skin:    General: Skin is warm and dry.     Findings: No erythema or rash.  Neurological:      General: No focal deficit present.     Mental Status: She is alert and oriented to person, place, and time.     Comments: 5/5 motor in bilateral lower extremities.  No saddle anesthesia.  Psychiatric:        Behavior: Behavior normal.      ED Treatments / Results  Labs (all labs ordered are listed, but only abnormal results are displayed) Labs Reviewed  URINALYSIS, ROUTINE W REFLEX MICROSCOPIC - Abnormal; Notable for the following components:      Result Value   Color, Urine STRAW (*)    All other components within normal limits  CBC WITH DIFFERENTIAL/PLATELET - Abnormal; Notable for the following components:   Hemoglobin 11.8 (*)    All other components within normal limits  BASIC METABOLIC PANEL - Abnormal; Notable for the following components:   Glucose, Bld 107 (*)    All other components within normal limits    EKG None  Radiology Dg Lumbar Spine Complete  Result Date: 11/18/2018 CLINICAL DATA:  Back pain EXAM: LUMBAR SPINE - COMPLETE 4+ VIEW COMPARISON:  10/19/2014 FINDINGS: Five lumbar type vertebral segments. Grade 2 anterolisthesis of L4 on L5 and grade 1 anterolisthesis of L5 on S1 are unchanged compared to prior. There is near-complete disc height loss of L4-5, also unchanged. Advanced lower lumbar facet arthrosis. Vertebral body heights are maintained without evidence of acute fracture. Bones are mildly demineralized. There is extensive aortoiliac vascular calcifications. IMPRESSION: Advanced lower lumbar spondylosis, similar in appearance to prior study 10/19/2014. No acute findings. Electronically Signed   By: Davina Poke M.D.   On: 11/18/2018 13:22   Ct Head Wo Contrast  Result  Date: 11/18/2018 CLINICAL DATA:  Altered level of consciousness EXAM: CT HEAD WITHOUT CONTRAST TECHNIQUE: Contiguous axial images were obtained from the base of the skull through the vertex without intravenous contrast. COMPARISON:  April 10, 2009 FINDINGS: Brain: No evidence of acute  infarction, hemorrhage, hydrocephalus, extra-axial collection or mass lesion/mass effect. There is chronic diffuse atrophy. Chronic bilateral periventricular white matter small vessel ischemic changes identified Vascular: No hyperdense vessel is noted. Skull: Normal. Negative for fracture or focal lesion. Sinuses/Orbits: No acute finding. Other: None IMPRESSION: No focal acute intracranial abnormality identified. Chronic diffuse atrophy and chronic bilateral periventricular white matter small vessel ischemic change. Electronically Signed   By: Abelardo Diesel M.D.   On: 11/18/2018 15:16    Procedures Procedures (including critical care time)  Medications Ordered in ED Medications  acetaminophen (TYLENOL) tablet 1,000 mg (1,000 mg Oral Given 11/18/18 1239)     Initial Impression / Assessment and Plan / ED Course  I have reviewed the triage vital signs and the nursing notes.  Pertinent labs & imaging results that were available during my care of the patient were reviewed by me and considered in my medical decision making (see chart for details).       Work-up is essentially negative for any acute process.  Patient is able to ambulate with assistance.  Social work has been involved and will evaluate for home health needs.  All this is been discussed with the patient's daughter and she is comfortable taking the patient home.  Advised to follow-up with her orthopedist for ongoing low back issues.  Return precautions given.   Final Clinical Impressions(s) / ED Diagnoses   Final diagnoses:  Chronic right-sided low back pain with right-sided sciatica  Dementia without behavioral disturbance, unspecified dementia type [facility])    ED Discharge Orders    None       Julianne Rice, MD 11/18/18 1626

## 2018-11-18 NOTE — TOC Initial Note (Signed)
Transition of Care [facility]) - Initial/Assessment Note    Patient Details  Name: [name] MRN: [id number] Date of Birth: June 02, 1930  Transition of Care [facility]) CM/SW Contact:    Erenest Rasher, RN Phone Number: [phone] 11/18/2018, 4:34 PM  Clinical Narrative:                 Spoke to pt and dtr, Guadlupe Spanish at bedside. Offered choice for [facility], and dtr agreeable to [place], [facility] with new referral. Pt has RW and cane at home. Dtr states they have [employer] that assist with transportation.   Expected Discharge Plan: [place] Barriers to Discharge: No Barriers Identified   Patient Goals and CMS Choice Patient states their goals for this hospitalization and ongoing recovery are:: wants to remain in the home to get stronger and be able to walk CMS Medicare.gov Compare Post Acute Care list provided to:: Patient Represenative (must comment)([name] [name] Heck) Choice offered to / list presented to : Patient  Expected Discharge Plan and Services Expected Discharge Plan: [place]   Discharge Planning Services: CM Consult Post Acute Care Choice: [place] arrangements for the past 2 months: Single Family Home                           HH Arranged: RN, PT, OT, Nurse's Aide, Social Work   Date [place]: 11/18/18 Time [place]: 1632 Representative spoke with at [place]: Adela Lank  Prior Living Arrangements/Services Living arrangements for the past 2 months: [place] with:: Self Patient language and need for interpreter reviewed:: Yes Do you feel safe going back to the place where you live?: Yes      Need for Family Participation in Patient Care: Yes (Comment) Care giver support system in place?: Yes (comment) Current home services: DME(rolling walker, cane) Criminal Activity/Legal Involvement Pertinent to Current Situation/Hospitalization: No -  Comment as needed  Activities of Daily Living      Permission Sought/Granted Permission sought to share information with : PCP, Case Manager, Family Supports, Other (comment) Permission granted to share information with : Yes, Verbal Permission Granted  Share Information with NAME: Guadlupe Spanish  Permission granted to share info w AGENCY: [facility]  Permission granted to share info w Relationship: daughter  Permission granted to share info w Contact Information: [phone]  Emotional Assessment Appearance:: Appears stated age Attitude/Demeanor/Rapport: Engaged, Gracious Affect (typically observed): Accepting Orientation: : Oriented to Self, Oriented to Place, Oriented to  Time, Oriented to Situation   Psych Involvement: No (comment)  Admission diagnosis:  back pain Patient Active Problem List   Diagnosis Date Noted  . Cellulitis of foot 12/05/2017  . Abscess of right foot 12/02/2017  . Mild dementia ([place]) 07/27/2017  . Buttock pain 10/30/2016  . Chronic RUQ pain 05/28/2016  . Right knee pain 06/14/2015  . Diabetic neuropathy ([place]) 03/21/2014  . Bunion 03/21/2014  . PVD (pulmonary valve disease) 01/23/2014  . Onychomycosis 12/20/2013  . PVD (peripheral vascular disease) ([place]) 12/19/2013  . Gout 12/13/2013  . Depression   . Spinal stenosis of lumbar region 04/24/2013  . Arthritis of shoulder region, left 09/11/2011  . Type 2 diabetes mellitus ([place]) 01/17/2009  . Anemia 10/15/2008  . CAD, NATIVE VESSEL 06/09/2008  . Hyperlipidemia 06/15/2007  . Essential hypertension 06/15/2007  . GERD 06/15/2007  . Fibromyalgia syndrome 06/15/2007  . TUBULOVILLOUS  ADENOMA, 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  . DIVERTICULOSIS, 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   PCP:  Binnie Rail, MD Pharmacy:   [facility] [address] [place] [address] [place] [address] Phone: [phone] Fax: [phone]     Social Determinants of Health (SDOH) Interventions     Readmission Risk Interventions No flowsheet data found.

## 2018-11-18 NOTE — ED Notes (Signed)
Bladder scan showed 3 mL post-residual

## 2018-11-22 ENCOUNTER — Telehealth: Payer: Self-pay

## 2018-11-22 DIAGNOSIS — E1140 Type 2 diabetes mellitus with diabetic neuropathy, unspecified: Secondary | ICD-10-CM | POA: Diagnosis not present

## 2018-11-22 DIAGNOSIS — Z7982 Long term (current) use of aspirin: Secondary | ICD-10-CM | POA: Diagnosis not present

## 2018-11-22 DIAGNOSIS — G4700 Insomnia, unspecified: Secondary | ICD-10-CM | POA: Diagnosis not present

## 2018-11-22 DIAGNOSIS — G8929 Other chronic pain: Secondary | ICD-10-CM | POA: Diagnosis not present

## 2018-11-22 DIAGNOSIS — M19011 Primary osteoarthritis, right shoulder: Secondary | ICD-10-CM | POA: Diagnosis not present

## 2018-11-22 DIAGNOSIS — K219 Gastro-esophageal reflux disease without esophagitis: Secondary | ICD-10-CM | POA: Diagnosis not present

## 2018-11-22 DIAGNOSIS — F0390 Unspecified dementia without behavioral disturbance: Secondary | ICD-10-CM | POA: Diagnosis not present

## 2018-11-22 DIAGNOSIS — M47816 Spondylosis without myelopathy or radiculopathy, lumbar region: Secondary | ICD-10-CM | POA: Diagnosis not present

## 2018-11-22 DIAGNOSIS — M48061 Spinal stenosis, lumbar region without neurogenic claudication: Secondary | ICD-10-CM | POA: Diagnosis not present

## 2018-11-22 DIAGNOSIS — E1151 Type 2 diabetes mellitus with diabetic peripheral angiopathy without gangrene: Secondary | ICD-10-CM | POA: Diagnosis not present

## 2018-11-22 DIAGNOSIS — Z951 Presence of aortocoronary bypass graft: Secondary | ICD-10-CM | POA: Diagnosis not present

## 2018-11-22 DIAGNOSIS — M109 Gout, unspecified: Secondary | ICD-10-CM | POA: Diagnosis not present

## 2018-11-22 DIAGNOSIS — I2510 Atherosclerotic heart disease of native coronary artery without angina pectoris: Secondary | ICD-10-CM | POA: Diagnosis not present

## 2018-11-22 DIAGNOSIS — I10 Essential (primary) hypertension: Secondary | ICD-10-CM | POA: Diagnosis not present

## 2018-11-22 DIAGNOSIS — E785 Hyperlipidemia, unspecified: Secondary | ICD-10-CM | POA: Diagnosis not present

## 2018-11-22 DIAGNOSIS — F329 Major depressive disorder, single episode, unspecified: Secondary | ICD-10-CM | POA: Diagnosis not present

## 2018-11-22 DIAGNOSIS — R350 Frequency of micturition: Secondary | ICD-10-CM | POA: Diagnosis not present

## 2018-11-22 DIAGNOSIS — M19012 Primary osteoarthritis, left shoulder: Secondary | ICD-10-CM | POA: Diagnosis not present

## 2018-11-22 DIAGNOSIS — M19041 Primary osteoarthritis, right hand: Secondary | ICD-10-CM | POA: Diagnosis not present

## 2018-11-22 DIAGNOSIS — M19042 Primary osteoarthritis, left hand: Secondary | ICD-10-CM | POA: Diagnosis not present

## 2018-11-22 DIAGNOSIS — K5790 Diverticulosis of intestine, part unspecified, without perforation or abscess without bleeding: Secondary | ICD-10-CM | POA: Diagnosis not present

## 2018-11-22 DIAGNOSIS — D649 Anemia, unspecified: Secondary | ICD-10-CM | POA: Diagnosis not present

## 2018-11-22 DIAGNOSIS — M797 Fibromyalgia: Secondary | ICD-10-CM | POA: Diagnosis not present

## 2018-11-22 DIAGNOSIS — M5441 Lumbago with sciatica, right side: Secondary | ICD-10-CM | POA: Diagnosis not present

## 2018-11-22 DIAGNOSIS — I378 Other nonrheumatic pulmonary valve disorders: Secondary | ICD-10-CM | POA: Diagnosis not present

## 2018-11-22 NOTE — Telephone Encounter (Signed)
LVM giving ok for orders per Dr. Burns.  

## 2018-11-22 NOTE — Telephone Encounter (Signed)
Copied from [place] [phone]. Topic: General - Other >> Nov 22, 2018  1:36 PM Rainey Pines A wrote: [facility] care called to get verbal orders for pt ,op, nursing,and  home health aid. Best contact number [phone]

## 2018-11-25 ENCOUNTER — Telehealth: Payer: Self-pay | Admitting: Internal Medicine

## 2018-11-25 NOTE — Telephone Encounter (Signed)
Okay to verbal orders

## 2018-11-25 NOTE — Telephone Encounter (Signed)
Home Health Verbal Orders - Caller/Agency: [facility] Callback Number: [phone] Requesting OT/PT/Skilled Nursing/Social Work/Speech Therapy: OT Frequency: 1w 2  For ADL IADL and exercise

## 2018-11-25 NOTE — Telephone Encounter (Signed)
Home Health Verbal Orders - Caller/Agency:  [facility]iece Ree Number: [phone] Requesting OT/PT/Skilled Nursing/Social Work/Speech Therapy: PT Frequency: 2x 2 weeks, 1x 2 weeks, 2x 2 weeks, 1x for 2 weeks.

## 2018-11-25 NOTE — Telephone Encounter (Signed)
LVM giving verbal 

## 2018-11-28 NOTE — Telephone Encounter (Signed)
Called Don no answer LMOM w/MD response../lmb 

## 2018-12-22 DIAGNOSIS — M48061 Spinal stenosis, lumbar region without neurogenic claudication: Secondary | ICD-10-CM | POA: Diagnosis not present

## 2018-12-22 DIAGNOSIS — R350 Frequency of micturition: Secondary | ICD-10-CM | POA: Diagnosis not present

## 2018-12-22 DIAGNOSIS — M19011 Primary osteoarthritis, right shoulder: Secondary | ICD-10-CM | POA: Diagnosis not present

## 2018-12-22 DIAGNOSIS — Z951 Presence of aortocoronary bypass graft: Secondary | ICD-10-CM | POA: Diagnosis not present

## 2018-12-22 DIAGNOSIS — E1151 Type 2 diabetes mellitus with diabetic peripheral angiopathy without gangrene: Secondary | ICD-10-CM | POA: Diagnosis not present

## 2018-12-22 DIAGNOSIS — M109 Gout, unspecified: Secondary | ICD-10-CM | POA: Diagnosis not present

## 2018-12-22 DIAGNOSIS — G4700 Insomnia, unspecified: Secondary | ICD-10-CM | POA: Diagnosis not present

## 2018-12-22 DIAGNOSIS — M5441 Lumbago with sciatica, right side: Secondary | ICD-10-CM | POA: Diagnosis not present

## 2018-12-22 DIAGNOSIS — K5790 Diverticulosis of intestine, part unspecified, without perforation or abscess without bleeding: Secondary | ICD-10-CM | POA: Diagnosis not present

## 2018-12-22 DIAGNOSIS — E785 Hyperlipidemia, unspecified: Secondary | ICD-10-CM | POA: Diagnosis not present

## 2018-12-22 DIAGNOSIS — G8929 Other chronic pain: Secondary | ICD-10-CM | POA: Diagnosis not present

## 2018-12-22 DIAGNOSIS — I2510 Atherosclerotic heart disease of native coronary artery without angina pectoris: Secondary | ICD-10-CM | POA: Diagnosis not present

## 2018-12-22 DIAGNOSIS — I10 Essential (primary) hypertension: Secondary | ICD-10-CM | POA: Diagnosis not present

## 2018-12-22 DIAGNOSIS — M47816 Spondylosis without myelopathy or radiculopathy, lumbar region: Secondary | ICD-10-CM | POA: Diagnosis not present

## 2018-12-22 DIAGNOSIS — E1140 Type 2 diabetes mellitus with diabetic neuropathy, unspecified: Secondary | ICD-10-CM | POA: Diagnosis not present

## 2018-12-22 DIAGNOSIS — M19012 Primary osteoarthritis, left shoulder: Secondary | ICD-10-CM | POA: Diagnosis not present

## 2018-12-22 DIAGNOSIS — D649 Anemia, unspecified: Secondary | ICD-10-CM | POA: Diagnosis not present

## 2018-12-22 DIAGNOSIS — F0390 Unspecified dementia without behavioral disturbance: Secondary | ICD-10-CM | POA: Diagnosis not present

## 2018-12-22 DIAGNOSIS — Z7982 Long term (current) use of aspirin: Secondary | ICD-10-CM | POA: Diagnosis not present

## 2018-12-22 DIAGNOSIS — I378 Other nonrheumatic pulmonary valve disorders: Secondary | ICD-10-CM | POA: Diagnosis not present

## 2018-12-22 DIAGNOSIS — F329 Major depressive disorder, single episode, unspecified: Secondary | ICD-10-CM | POA: Diagnosis not present

## 2018-12-22 DIAGNOSIS — M19041 Primary osteoarthritis, right hand: Secondary | ICD-10-CM | POA: Diagnosis not present

## 2018-12-22 DIAGNOSIS — M19042 Primary osteoarthritis, left hand: Secondary | ICD-10-CM | POA: Diagnosis not present

## 2018-12-22 DIAGNOSIS — M797 Fibromyalgia: Secondary | ICD-10-CM | POA: Diagnosis not present

## 2018-12-22 DIAGNOSIS — K219 Gastro-esophageal reflux disease without esophagitis: Secondary | ICD-10-CM | POA: Diagnosis not present

## 2018-12-27 DIAGNOSIS — M5441 Lumbago with sciatica, right side: Secondary | ICD-10-CM | POA: Diagnosis not present

## 2018-12-27 DIAGNOSIS — I10 Essential (primary) hypertension: Secondary | ICD-10-CM | POA: Diagnosis not present

## 2018-12-27 DIAGNOSIS — R350 Frequency of micturition: Secondary | ICD-10-CM | POA: Diagnosis not present

## 2018-12-27 DIAGNOSIS — M47816 Spondylosis without myelopathy or radiculopathy, lumbar region: Secondary | ICD-10-CM | POA: Diagnosis not present

## 2018-12-27 DIAGNOSIS — M48061 Spinal stenosis, lumbar region without neurogenic claudication: Secondary | ICD-10-CM | POA: Diagnosis not present

## 2018-12-27 DIAGNOSIS — G8929 Other chronic pain: Secondary | ICD-10-CM | POA: Diagnosis not present

## 2019-01-12 ENCOUNTER — Other Ambulatory Visit: Payer: Self-pay

## 2019-01-12 ENCOUNTER — Ambulatory Visit (INDEPENDENT_AMBULATORY_CARE_PROVIDER_SITE_OTHER): Payer: No Typology Code available for payment source

## 2019-01-12 DIAGNOSIS — Z23 Encounter for immunization: Secondary | ICD-10-CM

## 2019-02-20 ENCOUNTER — Other Ambulatory Visit: Payer: Self-pay | Admitting: Neurology

## 2019-02-21 NOTE — Telephone Encounter (Signed)
Requested Prescriptions   Pending Prescriptions Disp Refills  . DULoxetine (CYMBALTA) 60 MG capsule [Pharmacy Med Name: DULOXETINE HCL DR 60 MG CAPSULE] 90 capsule 2    Sig: TAKE ONE CAPSULE BY MOUTH DAILY   Rx last filled: 01/28/2018 #90 3 refills   Pt last seen: 10/20/18   Follow up appt scheduled:05/11/19

## 2019-03-01 NOTE — Progress Notes (Addendum)
Virtual Visit via Video Note  I connected with [name] on 03/02/19 at 11:00 AM EST by a video enabled telemedicine application and verified that I am speaking with the correct person using two identifiers.   I discussed the limitations of evaluation and management by telemedicine and the availability of in person appointments. The patient expressed understanding and agreed to proceed.  Present for the visit:  Myself, Dr Billey Gosling, [name], and her daughter [name].  The patient is currently at home and I am at home.    No referring provider.    History of Present Illness: She is here for follow up of her chronic medical conditions.   She is doing some physical therapy exercises for her back.  She states she feels on the right side of her abdomen moving sensation or lumps that can it comes and goes.  There is nothing superficial per her daughter.  Her daughter restarted Colace daily thinking this could be gas or constipation and she has complained less since then.  There is no abdominal pain.  Hypertension: She is taking her medication daily. She is compliant with a low sodium diet.  She denies chest pain, palpitations, edema, shortness of breath and regular headaches. She does not monitor her blood pressure at home.    Diabetes: She is taking her medication daily as prescribed. She is compliant with a diabetic diet.     Hyperlipidemia: She is taking her medication daily. She is compliant with a low fat/cholesterol diet. She denies myalgias.   GERD:  She is taking her medication daily as prescribed.  She denies any GERD symptoms and feels her GERD is well controlled.   Depression: She is taking her medication daily as prescribed. She denies any side effects from the medication. She feels her depression is well controlled and her daughter feels it is fairly controlled as well.   Dementia:  She follows with Dr Delice Lesch.   Doing word search daily.  On occasion she does get  depressed or anxious because she gets confused at times and realizes that she does have some memory issues.  Chronic back pain, stenosis:  She sees orthopedics.  She takes tylenol and gabapentin.  She uses voltaren gel as needed. Doing PT exercises and they help.     Review of Systems  Constitutional: Negative for chills and fever.  Respiratory: Negative for cough, shortness of breath and wheezing.   Cardiovascular: Negative for chest pain, palpitations and leg swelling.  Gastrointestinal: Negative for abdominal pain.  Neurological: Negative for dizziness and headaches.     Social History   Socioeconomic History  . Marital status: Widowed    Spouse name: Not on file  . Number of children: Not on file  . Years of education: Not on file  . Highest education level: Not on file  Occupational History  . Not on file  Social Needs  . Financial resource strain: Not on file  . Food insecurity    Worry: Not on file    Inability: Not on file  . Transportation needs    Medical: Not on file    Non-medical: Not on file  Tobacco Use  . Smoking status: Former Smoker    Quit date: 04/26/1970    Years since quitting: 48.8  . Smokeless tobacco: Never Used  Substance and Sexual Activity  . Alcohol use: Yes    Alcohol/week: 1.0 standard drinks    Types: 1 Glasses of wine per week  . Drug  use: No  . Sexual activity: Not on file  Lifestyle  . Physical activity    Days per week: Not on file    Minutes per session: Not on file  . Stress: Not on file  Relationships  . Social Herbalist on phone: Not on file    Gets together: Not on file    Attends religious service: Not on file    Active member of club or organization: Not on file    Attends meetings of clubs or organizations: Not on file    Relationship status: Not on file  Other Topics Concern  . Not on file  Social History Narrative   Widows, lives alone. 2 sons. 2 daughters. Retired Ambulance person      Observations/Objective: Appears well in NAD Breathing normally Mood and affect good  Blood pressure at home 138/76.  Weight 151  Assessment and Plan:  See Problem List for Assessment and Plan of chronic medical problems.   Follow Up Instructions:    I discussed the assessment and treatment plan with the patient. The patient was provided an opportunity to ask questions and all were answered. The patient agreed with the plan and demonstrated an understanding of the instructions.   The patient was advised to call back or seek an in-person evaluation if the symptoms worsen or if the condition fails to improve as anticipated.  Follow-up in 6 months.  Will defer blood work due to [employer] pandemic  Binnie Rail, MD

## 2019-03-02 ENCOUNTER — Ambulatory Visit (INDEPENDENT_AMBULATORY_CARE_PROVIDER_SITE_OTHER): Payer: Medicare Other | Admitting: Internal Medicine

## 2019-03-02 ENCOUNTER — Encounter: Payer: Self-pay | Admitting: Internal Medicine

## 2019-03-02 DIAGNOSIS — K219 Gastro-esophageal reflux disease without esophagitis: Secondary | ICD-10-CM

## 2019-03-02 DIAGNOSIS — I10 Essential (primary) hypertension: Secondary | ICD-10-CM

## 2019-03-02 DIAGNOSIS — M48061 Spinal stenosis, lumbar region without neurogenic claudication: Secondary | ICD-10-CM | POA: Diagnosis not present

## 2019-03-02 DIAGNOSIS — E782 Mixed hyperlipidemia: Secondary | ICD-10-CM | POA: Diagnosis not present

## 2019-03-02 DIAGNOSIS — F03A0 Unspecified dementia, mild, without behavioral disturbance, psychotic disturbance, mood disturbance, and anxiety: Secondary | ICD-10-CM

## 2019-03-02 DIAGNOSIS — I25810 Atherosclerosis of coronary artery bypass graft(s) without angina pectoris: Secondary | ICD-10-CM | POA: Diagnosis not present

## 2019-03-02 DIAGNOSIS — F0390 Unspecified dementia without behavioral disturbance: Secondary | ICD-10-CM

## 2019-03-02 DIAGNOSIS — E1140 Type 2 diabetes mellitus with diabetic neuropathy, unspecified: Secondary | ICD-10-CM

## 2019-03-02 DIAGNOSIS — F3289 Other specified depressive episodes: Secondary | ICD-10-CM

## 2019-03-02 MED ORDER — OMEPRAZOLE 40 MG PO CPDR: DELAYED_RELEASE_CAPSULE | ORAL | 1 refills | Status: DC

## 2019-03-02 MED ORDER — MELOXICAM 7.5 MG PO TABS: 7.5000 mg | ORAL_TABLET | Freq: Every day | ORAL | 1 refills | Status: DC

## 2019-03-02 MED ORDER — GABAPENTIN 300 MG PO CAPS: 300.0000 mg | ORAL_CAPSULE | Freq: Every day | ORAL | 1 refills | Status: DC

## 2019-03-02 MED ORDER — RAMIPRIL 10 MG PO CAPS: 10.0000 mg | ORAL_CAPSULE | Freq: Two times a day (BID) | ORAL | 1 refills | Status: DC

## 2019-03-02 MED ORDER — AMLODIPINE BESYLATE 5 MG PO TABS: 5.0000 mg | ORAL_TABLET | Freq: Two times a day (BID) | ORAL | 1 refills | Status: DC

## 2019-03-02 NOTE — Assessment & Plan Note (Signed)
Continue atorvastatin

## 2019-03-02 NOTE — Assessment & Plan Note (Signed)
GERD controlled Continue daily medication  

## 2019-03-02 NOTE — Assessment & Plan Note (Signed)
Blood pressure well controlled at home Continue current medications at current doses We will hold off on blood work due to [employer] pandemic

## 2019-03-02 NOTE — Assessment & Plan Note (Signed)
Following with Dr. Delice Lesch On Aricept and Namenda Occasionally anxious or depressed regarding confusion, poor memory

## 2019-03-02 NOTE — Assessment & Plan Note (Signed)
Diet controlled She has gained some weight and may be eating more sugars Her daughter will try to check her sugars We will hold off on blood work due to [employer] pandemic

## 2019-03-02 NOTE — Assessment & Plan Note (Signed)
Overall controlled Continue current dose of Cymbalta

## 2019-03-02 NOTE — Assessment & Plan Note (Signed)
Chronic lower back pain Doing PT exercises at home daily, which do help Continue meloxicam, Neurontin and Tylenol

## 2019-03-07 ENCOUNTER — Telehealth: Payer: Self-pay | Admitting: Internal Medicine

## 2019-03-07 NOTE — Telephone Encounter (Signed)
Left message for patient's daughter to call back to schedule. 

## 2019-03-07 NOTE — Telephone Encounter (Signed)
-----   Message from Binnie Rail, MD sent at 03/02/2019 11:24 AM EST ----- She needs a follow-up with me in 6 months-call her daughter to schedule

## 2019-04-30 DIAGNOSIS — L2489 Irritant contact dermatitis due to other agents: Secondary | ICD-10-CM | POA: Diagnosis not present

## 2019-04-30 DIAGNOSIS — L299 Pruritus, unspecified: Secondary | ICD-10-CM | POA: Diagnosis not present

## 2019-05-01 ENCOUNTER — Ambulatory Visit: Payer: Self-pay | Admitting: *Deleted

## 2019-05-01 NOTE — Telephone Encounter (Signed)
Agree with zyrtec daily.   If no improvement can follow up for further evaluation

## 2019-05-01 NOTE — Telephone Encounter (Signed)
Per initial encounter, "Pt daughter DR miller(daughter) called and stated that pt has itching in her palms and body'; contacted her daughter to discuss symptoms; she says the itching the itching was so bad that her mother called EMS; the pt was given diphenhydramine 25 mg chewable, and the itching stopped; ; the pt says she took another dose today at 1330 because her palms started itching; her palms, torso, and knees were itching; the pt does have a history of welts in the past (1970's) the pt's daughter added cetrizine to her night time meds; recommendations made per nurse triage protocol; the pt's daughter verbalized understanding; the pt sees Dr Quay Burow, [facility]; will route to office for notification.   Reason for Disposition . [1] Itching of unknown cause AND [2] present < 48 hours  Answer Assessment - Initial Assessment Questions 1. DESCRIPTION: "Describe the itching you are having."     Severe itching on torso, palms, andknees 2. SEVERITY: "How bad is it?"    - MILD - doesn't interfere with normal activities   - MODERATE-SEVERE: interferes with work, school, sleep, or other activities      severe 3. SCRATCHING: "Are there any scratch marks? Bleeding?"      4. ONSET: "When did this begin?"     04/30/19 5. CAUSE: "What do you think is causing the itching?" (ask about swimming pools, pollen, animals, soaps, etc.)     Not sure 6. OTHER SYMPTOMS: "Do you have any other symptoms?"      no 7. PREGNANCY: "Is there any chance you are pregnant?" "When was your last menstrual period?"     no  Protocols used: ITCHING [facility]

## 2019-05-04 DIAGNOSIS — Z23 Encounter for immunization: Secondary | ICD-10-CM | POA: Diagnosis not present

## 2019-05-11 ENCOUNTER — Telehealth (INDEPENDENT_AMBULATORY_CARE_PROVIDER_SITE_OTHER): Payer: Medicare Other | Admitting: Neurology

## 2019-05-11 ENCOUNTER — Other Ambulatory Visit: Payer: Self-pay

## 2019-05-11 ENCOUNTER — Encounter: Payer: Self-pay | Admitting: Neurology

## 2019-05-11 VITALS — Ht 59.0 in | Wt 154.0 lb

## 2019-05-11 DIAGNOSIS — F03A0 Unspecified dementia, mild, without behavioral disturbance, psychotic disturbance, mood disturbance, and anxiety: Secondary | ICD-10-CM

## 2019-05-11 DIAGNOSIS — F0390 Unspecified dementia without behavioral disturbance: Secondary | ICD-10-CM

## 2019-05-11 DIAGNOSIS — F320 Major depressive disorder, single episode, mild: Secondary | ICD-10-CM
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
## 2019-05-26 MED ORDER — DULOXETINE HCL 60 MG PO CPEP: 60.0000 mg | ORAL_CAPSULE | Freq: Every day | ORAL | 2 refills | Status: DC

## 2019-05-26 MED ORDER — DONEPEZIL HCL 10 MG PO TABS: ORAL_TABLET | ORAL | 3 refills | Status: DC

## 2019-05-26 MED ORDER — MEMANTINE HCL 10 MG PO TABS: 10.0000 mg | ORAL_TABLET | Freq: Two times a day (BID) | ORAL | 3 refills | Status: DC

## 2019-06-01 DIAGNOSIS — Z23 Encounter for immunization: Secondary | ICD-10-CM | POA: Diagnosis not present

## 2019-06-07 ENCOUNTER — Telehealth: Payer: Self-pay | Admitting: Neurology

## 2019-06-07 MED ORDER — BUSPIRONE HCL 5 MG PO TABS: 5.0000 mg | ORAL_TABLET | Freq: Two times a day (BID) | ORAL | 1 refills | Status: DC

## 2019-06-07 NOTE — Telephone Encounter (Signed)
For anxiety, some patients cognition can worsen with hydroxyzine. If she is okay with starting Buspar instead, which is for anxiety, we can do 5mg  twice a day. Side effects can be drowsiness and dizziness. If okay, pls send in Rx.

## 2019-06-07 NOTE — Telephone Encounter (Signed)
PT daughter Dr. [name] Heck called inforemed Per Dr Delice Lesch For anxiety, some patients cognition can worsen with hydroxyzine. If she is okay with starting Buspar instead, which is for anxiety, we can do 5mg  twice a day. Side effects can be drowsiness and dizziness. Script was sent to Kristopher Oppenheim at [facility]

## 2019-06-07 NOTE — Telephone Encounter (Signed)
Spoke with pt daughter about [name] is having increase in confusion, she is having an increase in anxiety in the early part of the day, pt daughter Dr.MIller is asking if her mother can have something to help with the anxiety something nonnarcotic (maybe hydroxyzine) so that she doesn't have an increase in falls? Also worried about hallucinations

## 2019-06-07 NOTE — Telephone Encounter (Signed)
Patient's daughter Dr. [name] Heck called regarding her mother and her worsening symptoms. Please Call. Thank you

## 2019-07-21 ENCOUNTER — Other Ambulatory Visit: Payer: Self-pay | Admitting: Internal Medicine

## 2019-07-21 DIAGNOSIS — Z1231 Encounter for screening mammogram for malignant neoplasm of breast: Secondary | ICD-10-CM

## 2019-07-27 ENCOUNTER — Other Ambulatory Visit: Payer: Self-pay | Admitting: Neurology

## 2019-08-02 ENCOUNTER — Other Ambulatory Visit: Payer: Self-pay | Admitting: Neurology

## 2019-08-02 NOTE — Patient Instructions (Addendum)
Blood work was ordered.     Medications reviewed and updated.  Changes include :   none    Please followup in 6 months     Neck Exercises Ask your health care provider which exercises are safe for you. Do exercises exactly as told by your health care provider and adjust them as directed. It is normal to feel mild stretching, pulling, tightness, or discomfort as you do these exercises. Stop right away if you feel sudden pain or your pain gets worse. Do not begin these exercises until told by your health care provider. Neck exercises can be important for many reasons. They can improve strength and maintain flexibility in your neck, which will help your upper back and prevent neck pain. Stretching exercises Rotation neck stretching  1. Sit in a chair or stand up. 2. Place your feet flat on the floor, shoulder width apart. 3. Slowly turn your head (rotate) to the right until a slight stretch is felt. Turn it all the way to the right so you can look over your right shoulder. Do not tilt or tip your head. 4. Hold this position for 10-30 seconds. 5. Slowly turn your head (rotate) to the left until a slight stretch is felt. Turn it all the way to the left so you can look over your left shoulder. Do not tilt or tip your head. 6. Hold this position for 10-30 seconds. Repeat __________ times. Complete this exercise __________ times a day. Neck retraction 1. Sit in a sturdy chair or stand up. 2. Look straight ahead. Do not bend your neck. 3. Use your fingers to push your chin backward (retraction). Do not bend your neck for this movement. Continue to face straight ahead. If you are doing the exercise properly, you will feel a slight sensation in your throat and a stretch at the back of your neck. 4. Hold the stretch for 1-2 seconds. Repeat __________ times. Complete this exercise __________ times a day. Strengthening exercises Neck press 1. Lie on your back on a firm bed or on the floor with a  pillow under your head. 2. Use your neck muscles to push your head down on the pillow and straighten your spine. 3. Hold the position as well as you can. Keep your head facing up (in a neutral position) and your chin tucked. 4. Slowly count to 5 while holding this position. Repeat __________ times. Complete this exercise __________ times a day. Isometrics These are exercises in which you strengthen the muscles in your neck while keeping your neck still (isometrics). 1. Sit in a supportive chair and place your hand on your forehead. 2. Keep your head and face facing straight ahead. Do not flex or extend your neck while doing isometrics. 3. Push forward with your head and neck while pushing back with your hand. Hold for 10 seconds. 4. Do the sequence again, this time putting your hand against the back of your head. Use your head and neck to push backward against the hand pressure. 5. Finally, do the same exercise on either side of your head, pushing sideways against the pressure of your hand. Repeat __________ times. Complete this exercise __________ times a day. Prone head lifts 1. Lie face-down (prone position), resting on your elbows so that your chest and upper back are raised. 2. Start with your head facing downward, near your chest. Position your chin either on or near your chest. 3. Slowly lift your head upward. Lift until you are looking straight ahead.  Then continue lifting your head as far back as you can comfortably stretch. 4. Hold your head up for 5 seconds. Then slowly lower it to your starting position. Repeat __________ times. Complete this exercise __________ times a day. Supine head lifts 1. Lie on your back (supine position), bending your knees to point to the ceiling and keeping your feet flat on the floor. 2. Lift your head slowly off the floor, raising your chin toward your chest. 3. Hold for 5 seconds. Repeat __________ times. Complete this exercise __________ times a  day. Scapular retraction 1. Stand with your arms at your sides. Look straight ahead. 2. Slowly pull both shoulders (scapulae) backward and downward (retraction) until you feel a stretch between your shoulder blades in your upper back. 3. Hold for 10-30 seconds. 4. Relax and repeat. Repeat __________ times. Complete this exercise __________ times a day. Contact a health care provider if:  Your neck pain or discomfort gets much worse when you do an exercise.  Your neck pain or discomfort does not improve within 2 hours after you exercise. If you have any of these problems, stop exercising right away. Do not do the exercises again unless your health care provider says that you can. Get help right away if:  You develop sudden, severe neck pain. If this happens, stop exercising right away. Do not do the exercises again unless your health care provider says that you can. This information is not intended to replace advice given to you by your health care provider. Make sure you discuss any questions you have with your health care provider. Document Revised: 02/02/2018 Document Reviewed: 02/02/2018 Elsevier Patient Education  [place].

## 2019-08-02 NOTE — Progress Notes (Signed)
Subjective:    Patient ID: [name], female    DOB: 09/08/1930, 86 y.o.   MRN: [id number]  HPI The patient is here for follow up of their chronic medical problems, including hypertension, diabetes, hyperlipidemia, GERD, depression, dementia, chronic back pain from stenosis.  She is taking all of her medications as prescribed.   She tries to be active at home-still lives alone and does her housecleaning.  She does have someone come in 2 days a week that helps her with things around the house and keeps her company.  She coughs when she eats.  If she swallows -occurs with food not with liquids.  No issues swallowing pills.  She does think that the difficulty is with drier foods and drinking water often helps.  She has not had this happen in a while.  She has tried to pay more attention to eating carefully and chewing thoroughly and she thinks that has helped.      BP at home ok.  Her daughter checks it.   Medications and allergies reviewed with patient and updated if appropriate.  Patient Active Problem List   Diagnosis Date Noted  . Abscess of right foot 12/02/2017  . Mild dementia ([place]) 07/27/2017  . Buttock pain 10/30/2016  . Chronic RUQ pain 05/28/2016  . Right knee pain 06/14/2015  . Diabetic neuropathy ([place]) 03/21/2014  . Bunion 03/21/2014  . PVD (pulmonary valve disease) 01/23/2014  . Onychomycosis 12/20/2013  . PVD (peripheral vascular disease) ([place]) 12/19/2013  . Gout 12/13/2013  . Depression   . Spinal stenosis of lumbar region 04/24/2013  . Arthritis of shoulder region, left 09/11/2011  . Type 2 diabetes mellitus ([place]) 01/17/2009  . Anemia 10/15/2008  . CAD, NATIVE VESSEL 06/09/2008  . Hyperlipidemia 06/15/2007  . Essential hypertension 06/15/2007  . GERD 06/15/2007  . Fibromyalgia syndrome 06/15/2007  . 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 DIVERTICULOSIS, 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    Current Outpatient Medications on File Prior to Visit  Medication  Sig Dispense Refill  . acetaminophen (TYLENOL) 500 MG tablet Take 500 mg by mouth every 6 (six) hours as needed.    Marland Kitchen amLODipine (NORVASC) 5 MG tablet Take 1 tablet (5 mg total) by mouth 2 (two) times daily. 180 tablet 1  . aspirin EC 81 MG tablet Take 81 mg by mouth daily.    Marland Kitchen atorvastatin (LIPITOR) 40 MG tablet Take 1 tablet (40 mg total) by mouth daily. 90 tablet 3  . busPIRone (BUSPAR) 5 MG tablet TAKE ONE TABLET BY MOUTH TWICE A DAY 60 tablet 0  . carvedilol (COREG) 6.25 MG tablet TAKE 1 TABLET BY MOUTH  TWO TIMES A DAY 180 tablet 1  . cetirizine (ZYRTEC) 10 MG tablet TAKE ONE TABLET BY MOUTH DAILY (Patient taking differently: Take 10 mg by mouth at bedtime. ) 90 tablet 3  . Cholecalciferol (D3-1000) 25 MCG (1000 UT) capsule Take 1,000 Units by mouth daily.    . diclofenac sodium (VOLTAREN) 1 % GEL Apply 2 g topically 4 (four) times daily as needed (pain). 100 g 5  . donepezil (ARICEPT) 10 MG tablet Take 1 tablet daily 90 tablet 3  . DULoxetine (CYMBALTA) 60 MG capsule Take 1 capsule (60 mg total) by mouth daily. 90 capsule 2  . gabapentin (NEURONTIN) 300 MG capsule Take 1 capsule (300 mg total) by mouth at bedtime. 90 capsule 1  . meloxicam (MOBIC) 7.5 MG tablet Take 1 tablet (7.5 mg total) by mouth daily. 90 tablet 1  .  memantine (NAMENDA) 10 MG tablet Take 1 tablet (10 mg total) by mouth 2 (two) times daily. 180 tablet 3  . Multiple Vitamin (MULTIVITAMIN) tablet Take 1 tablet by mouth daily.    . Multiple Vitamins-Minerals (ZINC PO) Take 1 tablet by mouth daily.    Marland Kitchen omeprazole (PRILOSEC) 40 MG capsule TAKE 1 CAPSULE(S) BY MOUTH DAILY 90 capsule 1  . ramipril (ALTACE) 10 MG capsule Take 1 capsule (10 mg total) by mouth 2 (two) times daily. 180 capsule 1   No current facility-administered medications on file prior to visit.    Past Medical History:  Diagnosis Date  . CAD, NATIVE VESSEL 06/09/2008  . DIABETES MELLITUS, TYPE II   . FIBROMYALGIA   . GERD   . Gout   . HIATAL HERNIA     . HYPERLIPIDEMIA   . HYPERTENSION   . Insomnia   . OA (osteoarthritis)    severe, shoulders, hands - inflammatory, ?RA  . S/P CABG x 1   . S/P inguinal hernia repair   . TUBULOVILLOUS ADENOMA, 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   colo 04/2012 - no polyps - no further screening planned (age >76)  . UNSPECIFIED ANEMIA     Past Surgical History:  Procedure Laterality Date  . ABDOMINAL HYSTERECTOMY  1981  . APPENDECTOMY    . CATARACT EXTRACTION, BILATERAL    . CORONARY ARTERY BYPASS GRAFT  2010  . HERNIA REPAIR     INGUINAL  . I & D EXTREMITY Left 12/07/2017   Procedure: IRRIGATION AND DEBRIDEMENT EXTREMITY WITH FOREIGN BODY REMOVAL;  Surgeon: Nicholes Stairs, MD;  Location: [facility];  Service: Orthopedics;  Laterality: Left;  . JOINT REPLACEMENT    . OVARIAN CYST REMOVAL    . REPLACEMENT TOTAL KNEE BILATERAL Bilateral    Rt-1974, Lt -1989  . REVERSE SHOULDER ARTHROPLASTY  09/08/2011   Procedure: REVERSE SHOULDER ARTHROPLASTY;  Surgeon: Nita Sells, MD;  Location: [facility];  Service: Orthopedics;  Laterality: Left;  left reverse total shoulder arthroplasty  . ROTATOR CUFF REPAIR     Left    Social History   Socioeconomic History  . Marital status: Widowed    Spouse name: Not on file  . Number of children: Not on file  . Years of education: Not on file  . Highest education level: Not on file  Occupational History  . Not on file  Tobacco Use  . Smoking status: Former Smoker    Quit date: 04/26/1970    Years since quitting: 49.3  . Smokeless tobacco: Never Used  Substance and Sexual Activity  . Alcohol use: Yes    Alcohol/week: 1.0 standard drinks    Types: 1 Glasses of wine per week  . Drug use: No  . Sexual activity: Not on file  Other Topics Concern  . Not on file  Social History Narrative   Widows, lives alone. 2 sons. 2 daughters. Retired Ambulance person   Social Determinants of Radio broadcast assistant Strain:   . Difficulty of Paying Living Expenses:   Food  Insecurity:   . Worried About Charity fundraiser in the Last Year:   . Arboriculturist in the Last Year:   Transportation Needs:   . Film/video editor (Medical):   Marland Kitchen Lack of Transportation (Non-Medical):   Physical Activity:   . Days of Exercise per Week:   . Minutes of Exercise per Session:   Stress:   . Feeling of Stress :   Social Connections:   .  Frequency of Communication with Friends and Family:   . Frequency of Social Gatherings with Friends and Family:   . Attends Religious Services:   . Active Member of Clubs or Organizations:   . Attends Archivist Meetings:   Marland Kitchen Marital Status:     Family History  Problem Relation Age of Onset  . Cancer Mother        Stomach  . Stomach cancer Mother 55  . Hypertension Mother   . Heart disease Father 87       MI  . Diabetes Father   . Heart attack Father   . Diabetes Daughter   . Hypertension Daughter   . Vascular Disease Sister   . Stroke Sister   . Dementia Sister   . Diabetes Sister   . Parkinsonism Sister   . Stroke Sister   . Heart disease Sister   . Heart attack Brother   . Anesthesia problems Neg Hx   . Colon cancer Neg Hx     Review of Systems  Constitutional: Negative for appetite change, chills and fever.  Respiratory: Negative for cough, shortness of breath and wheezing.   Cardiovascular: Negative for chest pain, palpitations and leg swelling.  Musculoskeletal: Positive for arthralgias and back pain.  Neurological: Negative for light-headedness and headaches.       Objective:   Vitals:   08/03/19 1050  BP: (!) 160/68  Pulse: 61  Resp: 16  Temp: 98.3 F (36.8 C)  SpO2: 96%   BP Readings from Last 3 Encounters:  08/03/19 (!) 160/68  11/18/18 (!) 160/77  09/08/18 (!) 179/71   Wt Readings from Last 3 Encounters:  08/03/19 155 lb (70.3 kg)  05/11/19 154 lb (69.9 kg)  10/20/18 137 lb (62.1 kg)   Body mass index is 31.31 kg/m.   Physical Exam    Constitutional: Appears  well-developed and well-nourished. No distress.  HENT:  Head: Normocephalic and atraumatic.  Neck: Neck supple. No tracheal deviation present. No thyromegaly present.  No cervical lymphadenopathy Cardiovascular: Normal rate, regular rhythm and normal heart sounds.   2/6 systolic murmur heard. No carotid bruit .  No edema Pulmonary/Chest: Effort normal and breath sounds normal. No respiratory distress. No has no wheezes. No rales.  Skin: Skin is warm and dry. Not diaphoretic.  Psychiatric: Normal mood and affect. Behavior is normal.      Assessment & Plan:    See Problem List for Assessment and Plan of chronic medical problems.    This visit occurred during the SARS-CoV-2 public health emergency.  Safety protocols were in place, including screening questions prior to the visit, additional usage of staff PPE, and extensive cleaning of exam room while observing appropriate contact time as indicated for disinfecting solutions.

## 2019-08-03 ENCOUNTER — Other Ambulatory Visit: Payer: Self-pay

## 2019-08-03 ENCOUNTER — Encounter: Payer: Self-pay | Admitting: Internal Medicine

## 2019-08-03 ENCOUNTER — Ambulatory Visit (INDEPENDENT_AMBULATORY_CARE_PROVIDER_SITE_OTHER): Payer: Medicare Other | Admitting: Internal Medicine

## 2019-08-03 VITALS — BP 160/68 | HR 61 | Temp 98.3°F | Resp 16 | Ht 59.0 in | Wt 155.0 lb

## 2019-08-03 DIAGNOSIS — E1140 Type 2 diabetes mellitus with diabetic neuropathy, unspecified: Secondary | ICD-10-CM

## 2019-08-03 DIAGNOSIS — E0841 Diabetes mellitus due to underlying condition with diabetic mononeuropathy: Secondary | ICD-10-CM | POA: Diagnosis not present

## 2019-08-03 DIAGNOSIS — F3289 Other specified depressive episodes: Secondary | ICD-10-CM

## 2019-08-03 DIAGNOSIS — F0390 Unspecified dementia without behavioral disturbance: Secondary | ICD-10-CM

## 2019-08-03 DIAGNOSIS — F03A0 Unspecified dementia, mild, without behavioral disturbance, psychotic disturbance, mood disturbance, and anxiety: Secondary | ICD-10-CM

## 2019-08-03 DIAGNOSIS — E782 Mixed hyperlipidemia: Secondary | ICD-10-CM

## 2019-08-03 DIAGNOSIS — K219 Gastro-esophageal reflux disease without esophagitis: Secondary | ICD-10-CM | POA: Diagnosis not present

## 2019-08-03 DIAGNOSIS — E1149 Type 2 diabetes mellitus with other diabetic neurological complication: Secondary | ICD-10-CM | POA: Diagnosis not present

## 2019-08-03 DIAGNOSIS — I10 Essential (primary) hypertension: Secondary | ICD-10-CM | POA: Diagnosis not present

## 2019-08-03 DIAGNOSIS — I2510 Atherosclerotic heart disease of native coronary artery without angina pectoris: Secondary | ICD-10-CM | POA: Diagnosis not present

## 2019-08-03 DIAGNOSIS — M48061 Spinal stenosis, lumbar region without neurogenic claudication: Secondary | ICD-10-CM

## 2019-08-03 LAB — CBC WITH DIFFERENTIAL/PLATELET
Basophils Absolute: 0 10*3/uL (ref 0.0–0.1)
Basophils Relative: 0.4 % (ref 0.0–3.0)
Eosinophils Absolute: 0.3 10*3/uL (ref 0.0–0.7)
Eosinophils Relative: 5.1 % — ABNORMAL HIGH (ref 0.0–5.0)
HCT: 34.4 % — ABNORMAL LOW (ref 36.0–46.0)
Hemoglobin: 11 g/dL — ABNORMAL LOW (ref 12.0–15.0)
Lymphocytes Relative: 37 % (ref 12.0–46.0)
Lymphs Abs: 2.2 10*3/uL (ref 0.7–4.0)
MCHC: 32 g/dL (ref 30.0–36.0)
MCV: 87.3 fl (ref 78.0–100.0)
Monocytes Absolute: 0.5 10*3/uL (ref 0.1–1.0)
Monocytes Relative: 9.4 % (ref 3.0–12.0)
Neutro Abs: 2.8 10*3/uL (ref 1.4–7.7)
Neutrophils Relative %: 48.1 % (ref 43.0–77.0)
Platelets: 184 10*3/uL (ref 150.0–400.0)
RBC: 3.94 Mil/uL (ref 3.87–5.11)
RDW: 16.3 % — ABNORMAL HIGH (ref 11.5–15.5)
WBC: 5.8 10*3/uL (ref 4.0–10.5)

## 2019-08-03 LAB — COMPREHENSIVE METABOLIC PANEL
ALT: 17 U/L (ref 0–35)
AST: 24 U/L (ref 0–37)
Albumin: 4.2 g/dL (ref 3.5–5.2)
Alkaline Phosphatase: 77 U/L (ref 39–117)
BUN: 14 mg/dL (ref 6–23)
CO2: 32 mEq/L (ref 19–32)
Calcium: 9.4 mg/dL (ref 8.4–10.5)
Chloride: 100 mEq/L (ref 96–112)
Creatinine, Ser: 0.89 mg/dL (ref 0.40–1.20)
GFR: 72.57 mL/min (ref >60.00)
Glucose, Bld: 132 mg/dL — ABNORMAL HIGH (ref 70–99)
Potassium: 5 mEq/L (ref 3.5–5.1)
Sodium: 137 mEq/L (ref 135–145)
Total Bilirubin: 0.8 mg/dL (ref 0.2–1.2)
Total Protein: 7.1 g/dL (ref 6.0–8.3)

## 2019-08-03 LAB — LIPID PANEL
Cholesterol: 167 mg/dL (ref 0–200)
HDL: 87 mg/dL (ref >39.00)
LDL Cholesterol: 64 mg/dL (ref 0–99)
NonHDL: 79.52
Total CHOL/HDL Ratio: 2
Triglycerides: 77 mg/dL (ref 0.0–149.0)
VLDL: 15.4 mg/dL (ref 0.0–40.0)

## 2019-08-03 LAB — HEMOGLOBIN A1C: Hgb A1c MFr Bld: 6.4 % (ref 4.6–6.5)

## 2019-08-03 NOTE — Assessment & Plan Note (Signed)
Chronic Controlled, stable Continue current dose of medication Cymbalta 60 mg daily 

## 2019-08-03 NOTE — Assessment & Plan Note (Signed)
Chronic Lower back pain Does PT exercises at home and that has helped Taking meloxicam, Neurontin and Tylenol Continue

## 2019-08-03 NOTE — Assessment & Plan Note (Signed)
Chronic Check lipid panel  Continue daily statin 

## 2019-08-03 NOTE — Assessment & Plan Note (Signed)
Chronic GERD controlled Continue daily medication  

## 2019-08-03 NOTE — Assessment & Plan Note (Signed)
Chronic Taking Cymbalta and gabapentin Controlled Continue above A1c today

## 2019-08-03 NOTE — Assessment & Plan Note (Signed)
Chronic Diabetes with neuropathy Taking gabapentin nightly Daughter has not been able to check sugars at home Check A1c today Currently diet controlled

## 2019-08-03 NOTE — Assessment & Plan Note (Signed)
Chronic No concerning symptoms of angina Continue current medications Sees cardiology annually

## 2019-08-03 NOTE — Assessment & Plan Note (Signed)
Chronic BP well controlled at home - she does get anxious here Current regimen effective and well tolerated Continue current medications at current doses cmp

## 2019-08-03 NOTE — Assessment & Plan Note (Signed)
Chronic Following with Dr. Delice Lesch On Aricept, Namenda BuSpar twice daily-has helped sundowning Lives alone, has help coming 2 times a week Daughter very involved Overall doing well

## 2019-08-08 ENCOUNTER — Encounter: Payer: Self-pay | Admitting: Internal Medicine

## 2019-08-10 ENCOUNTER — Ambulatory Visit
Admission: RE | Admit: 2019-08-10 | Discharge: 2019-08-10 | Disposition: A | Discharge status: Home / Self Care | Payer: Medicare Other | Source: Ambulatory Visit | Attending: Internal Medicine | Admitting: Internal Medicine

## 2019-08-10 ENCOUNTER — Other Ambulatory Visit: Payer: Self-pay

## 2019-08-10 DIAGNOSIS — Z1231 Encounter for screening mammogram for malignant neoplasm of breast: Secondary | ICD-10-CM | POA: Diagnosis not present

## 2019-08-31 ENCOUNTER — Other Ambulatory Visit: Payer: Self-pay | Admitting: Internal Medicine

## 2019-09-07 ENCOUNTER — Ambulatory Visit: Payer: No Typology Code available for payment source | Admitting: Internal Medicine

## 2019-09-13 ENCOUNTER — Other Ambulatory Visit: Payer: Self-pay

## 2019-09-13 ENCOUNTER — Encounter (HOSPITAL_COMMUNITY): Payer: Self-pay | Admitting: Emergency Medicine

## 2019-09-13 ENCOUNTER — Emergency Department (HOSPITAL_COMMUNITY): Payer: Medicare Other

## 2019-09-13 ENCOUNTER — Emergency Department (HOSPITAL_COMMUNITY)
Admission: EM | Admit: 2019-09-13 | Discharge: 2019-09-13 | Disposition: A | Discharge status: Home / Self Care | Payer: Medicare Other | Attending: Emergency Medicine | Admitting: Emergency Medicine

## 2019-09-13 DIAGNOSIS — Z951 Presence of aortocoronary bypass graft: Secondary | ICD-10-CM | POA: Insufficient documentation

## 2019-09-13 DIAGNOSIS — Z87891 Personal history of nicotine dependence: Secondary | ICD-10-CM | POA: Diagnosis not present

## 2019-09-13 DIAGNOSIS — G8929 Other chronic pain: Secondary | ICD-10-CM | POA: Diagnosis not present

## 2019-09-13 DIAGNOSIS — E119 Type 2 diabetes mellitus without complications: Secondary | ICD-10-CM | POA: Insufficient documentation

## 2019-09-13 DIAGNOSIS — Z7982 Long term (current) use of aspirin: Secondary | ICD-10-CM | POA: Diagnosis not present

## 2019-09-13 DIAGNOSIS — I10 Essential (primary) hypertension: Secondary | ICD-10-CM | POA: Insufficient documentation

## 2019-09-13 DIAGNOSIS — Z79899 Other long term (current) drug therapy: Secondary | ICD-10-CM | POA: Insufficient documentation

## 2019-09-13 DIAGNOSIS — M5489 Other dorsalgia: Secondary | ICD-10-CM | POA: Diagnosis not present

## 2019-09-13 DIAGNOSIS — Z7984 Long term (current) use of oral hypoglycemic drugs: Secondary | ICD-10-CM | POA: Insufficient documentation

## 2019-09-13 DIAGNOSIS — R52 Pain, unspecified: Secondary | ICD-10-CM | POA: Diagnosis not present

## 2019-09-13 DIAGNOSIS — M5441 Lumbago with sciatica, right side: Secondary | ICD-10-CM | POA: Diagnosis not present

## 2019-09-13 DIAGNOSIS — I2510 Atherosclerotic heart disease of native coronary artery without angina pectoris: Secondary | ICD-10-CM | POA: Diagnosis not present

## 2019-09-13 DIAGNOSIS — M545 Low back pain: Secondary | ICD-10-CM | POA: Diagnosis present

## 2019-09-13 MED ORDER — ACETAMINOPHEN 500 MG PO TABS
1000.0000 mg | ORAL_TABLET | Freq: Once | ORAL | Status: AC
  Administered 2019-09-13: 1000 mg via ORAL
  Filled 2019-09-13: qty 2

## 2019-09-13 NOTE — ED Provider Notes (Signed)
[name]   CSN: [id number] Arrival date & time: 09/13/19  1431     History Chief Complaint  Patient presents with  . Back Pain    [name] is a 86 y.o. female with past medical history of fibromyalgia, diabetes, osteoarthritis, dementia presenting to the ED with a chief complaint of right-sided back pain.  Reports for the past several days she has noted worsening right-sided back pain that is worse with ambulation and palpation.  She does have history of degenerative disc disease and prior back surgeries with the most recent one being more than 10 years ago.  She remains ambulatory with a walker as needed otherwise she ambulates unassisted.  She denies any injury or trauma.  She does not take any medications to help with her pain, she does take gabapentin for neuropathy.  She denies any numbness in arms or legs, saddle anesthesia, losing control of her bowels or bladder, chest pain, abdominal pain.  HPI     Past Medical History:  Diagnosis Date  . CAD, NATIVE VESSEL 06/09/2008  . DIABETES MELLITUS, TYPE II   . FIBROMYALGIA   . GERD   . Gout   . HIATAL HERNIA   . HYPERLIPIDEMIA   . HYPERTENSION   . Insomnia   . OA (osteoarthritis)    severe, shoulders, hands - inflammatory, ?RA  . S/P CABG x 1   . S/P inguinal hernia repair   . TUBULOVILLOUS ADENOMA, 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   colo 04/2012 - no polyps - no further screening planned (age >55)  . UNSPECIFIED ANEMIA     Patient Active Problem List   Diagnosis Date Noted  . Mild dementia ([place]) 07/27/2017  . Chronic RUQ pain 05/28/2016  . Right knee pain 06/14/2015  . Diabetic neuropathy ([place]) 03/21/2014  . Bunion 03/21/2014  . PVD (pulmonary valve disease) 01/23/2014  . Onychomycosis 12/20/2013  . PVD (peripheral vascular disease) ([place]) 12/19/2013  . Gout 12/13/2013  . Depression   . Spinal stenosis of lumbar region 04/24/2013  . Arthritis of shoulder region, left  09/11/2011  . Type II diabetes mellitus with neurological manifestations ([place]) 01/17/2009  . Anemia 10/15/2008  . CAD, NATIVE VESSEL 06/09/2008  . Hyperlipidemia 06/15/2007  . Essential hypertension 06/15/2007  . GERD 06/15/2007  . Fibromyalgia syndrome 06/15/2007  . 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 DIVERTICULOSIS, 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    Past Surgical History:  Procedure Laterality Date  . ABDOMINAL HYSTERECTOMY  1981  . APPENDECTOMY    . CATARACT EXTRACTION, BILATERAL    . CORONARY ARTERY BYPASS GRAFT  2010  . HERNIA REPAIR     INGUINAL  . I & D EXTREMITY Left 12/07/2017   Procedure: IRRIGATION AND DEBRIDEMENT EXTREMITY WITH FOREIGN BODY REMOVAL;  Surgeon: Nicholes Stairs, MD;  Location: [facility];  Service: Orthopedics;  Laterality: Left;  . JOINT REPLACEMENT    . OVARIAN CYST REMOVAL    . REPLACEMENT TOTAL KNEE BILATERAL Bilateral    Rt-1974, Lt -1989  . REVERSE SHOULDER ARTHROPLASTY  09/08/2011   Procedure: REVERSE SHOULDER ARTHROPLASTY;  Surgeon: Nita Sells, MD;  Location: [facility];  Service: Orthopedics;  Laterality: Left;  left reverse total shoulder arthroplasty  . ROTATOR CUFF REPAIR     Left     OB History   No obstetric history on file.     Family History  Problem Relation Age of Onset  . Cancer Mother        Stomach  .  Stomach cancer Mother 60  . Hypertension Mother   . Heart disease Father 70       MI  . Diabetes Father   . Heart attack Father   . Diabetes Daughter   . Hypertension Daughter   . Vascular Disease Sister   . Stroke Sister   . Dementia Sister   . Diabetes Sister   . Parkinsonism Sister   . Stroke Sister   . Heart disease Sister   . Heart attack Brother   . Anesthesia problems Neg Hx   . Colon cancer Neg Hx     Social History   Tobacco Use  . Smoking status: Former Smoker    Quit date: 04/26/1970    Years since quitting: 49.4  . Smokeless tobacco: Never Used  Substance Use Topics  . Alcohol use: Yes     Alcohol/week: 1.0 standard drinks    Types: 1 Glasses of wine per week  . Drug use: No    Home Medications Prior to Admission medications   Medication Sig Start Date End Date Taking? Authorizing Provider  acetaminophen (TYLENOL) 500 MG tablet Take 500 mg by mouth every 6 (six) hours as needed.    [provider]  amLODipine (NORVASC) 5 MG tablet Take 1 tablet (5 mg total) by mouth 2 (two) times daily. 03/02/19   Binnie Rail, MD  aspirin EC 81 MG tablet Take 81 mg by mouth daily.    [provider]  atorvastatin (LIPITOR) 40 MG tablet Take 1 tablet (40 mg total) by mouth daily. 08/25/18   Binnie Rail, MD  busPIRone (BUSPAR) 5 MG tablet TAKE ONE TABLET BY MOUTH TWICE A DAY 08/02/19   Cameron Sprang, MD  carvedilol (COREG) 6.25 MG tablet TAKE 1 TABLET BY MOUTH TWO TIMES A DAY 09/01/19   Fay Records, MD  cetirizine (ZYRTEC) 10 MG tablet TAKE ONE TABLET BY MOUTH DAILY Patient taking differently: Take 10 mg by mouth at bedtime.  02/22/17   Binnie Rail, MD  Cholecalciferol (D3-1000) 25 MCG (1000 UT) capsule Take 1,000 Units by mouth daily.    [provider]  diclofenac sodium (VOLTAREN) 1 % GEL Apply 2 g topically 4 (four) times daily as needed (pain). 08/25/18   Binnie Rail, MD  donepezil (ARICEPT) 10 MG tablet Take 1 tablet daily 05/26/19   Cameron Sprang, MD  DULoxetine (CYMBALTA) 60 MG capsule Take 1 capsule (60 mg total) by mouth daily. 05/26/19   Cameron Sprang, MD  gabapentin (NEURONTIN) 300 MG capsule Take 1 capsule (300 mg total) by mouth at bedtime. 03/02/19   Binnie Rail, MD  meloxicam (MOBIC) 7.5 MG tablet Take 1 tablet (7.5 mg total) by mouth daily. 03/02/19   Binnie Rail, MD  memantine (NAMENDA) 10 MG tablet Take 1 tablet (10 mg total) by mouth 2 (two) times daily. 05/26/19   Cameron Sprang, MD  Multiple Vitamin (MULTIVITAMIN) tablet Take 1 tablet by mouth daily.    [provider]  Multiple Vitamins-Minerals (ZINC PO) Take 1 tablet by  mouth daily.    [provider]  omeprazole (PRILOSEC) 40 MG capsule TAKE 1 CAPSULE(S) BY MOUTH DAILY 03/02/19   Binnie Rail, MD  ramipril (ALTACE) 10 MG capsule Take 1 capsule (10 mg total) by mouth 2 (two) times daily. 03/02/19   Binnie Rail, MD    Allergies    Patient has no known allergies.  Review of Systems   Review of  Systems  Constitutional: Negative for chills and fever.  Musculoskeletal: Positive for back pain and myalgias.  Neurological: Negative for weakness and numbness.    Physical Exam Updated Vital Signs BP (!) 127/59 (BP Location: Right Arm)   Pulse 60   Temp 98.8 F (37.1 C) (Oral)   Resp 16   Ht 4\' 11"  (1.499 m)   Wt 70 kg   SpO2 100%   BMI 31.17 kg/m   Physical Exam Vitals and nursing note reviewed.  Constitutional:      General: She is not in acute distress.    Appearance: She is well-developed. She is not diaphoretic.  HENT:     Head: Normocephalic and atraumatic.  Eyes:     General: No scleral icterus.    Conjunctiva/sclera: Conjunctivae normal.  Cardiovascular:     Rate and Rhythm: Normal rate and regular rhythm.     Heart sounds: Normal heart sounds.  Pulmonary:     Effort: Pulmonary effort is normal. No respiratory distress.     Breath sounds: Normal breath sounds.  Abdominal:     Tenderness: There is no abdominal tenderness. There is no right CVA tenderness or left CVA tenderness.  Musculoskeletal:     Cervical back: Normal range of motion.     Lumbar back: Bony tenderness present.       Back:     Comments: R paraspinal musculature of lumbar spine. No midline spinal tenderness present in lumbar, thoracic or cervical spine. No step-off palpated. No visible bruising, edema or temperature change noted. No objective signs of numbness present. No saddle anesthesia. 2+ DP pulses bilaterally. Sensation intact to light touch. Strength 5/5 in bilateral lower extremities.  Skin:    Findings: No rash.  Neurological:     Mental  Status: She is alert.     ED Results / Procedures / Treatments   Labs (all labs ordered are listed, but only abnormal results are displayed) Labs Reviewed - No data to display  EKG None  Radiology DG Lumbar Spine Complete  Result Date: 09/13/2019 CLINICAL DATA:  Chronic pain EXAM: LUMBAR SPINE - COMPLETE 4+ VIEW COMPARISON:  11/18/2018, CT 10/19/2014 FINDINGS: Similar numbering scheme as 11/18/2018. 10 mm anterolisthesis L4 on L5 and 5 mm anterolisthesis L5 on S1. Vertebral body heights are maintained. History of chronic pars defect at L5. Advanced degenerative disease at L4-L5. Similar moderate degenerative change at L5-S1. Aortic atherosclerosis IMPRESSION: 1. No acute osseous abnormality 2. Similar anterolisthesis L4 on L5 and L5 on S1 with associated degenerative changes Electronically Signed   By: Donavan Foil M.D.   On: 09/13/2019 17:40    Procedures Procedures (including critical care time)  Medications Ordered in ED Medications  acetaminophen (TYLENOL) tablet 1,000 mg (1,000 mg Oral Given 09/13/19 1640)    ED Course  I have reviewed the triage vital signs and the nursing notes.  Pertinent labs & imaging results that were available during my care of the patient were reviewed by me and considered in my medical decision making (see chart for details).    MDM Rules/Calculators/A&P                      86 year old female presenting to the ED with a chief complaint of right-sided back pain.  Reports pain is sharp, worse with movement and radiates down her leg.  Reports history of similar symptoms in the past.  Has not taken any medication help with her symptoms.  On exam there are tender palpation  of the right lower lumbar paraspinal musculature.  She remains ambulatory.  X-ray shows no acute abnormalities.  She denies injuries or falls, loss of bowel or bladder function.  Patient's pain controlled here with Tylenol.  Suspect this is acute on chronic flareup of her sciatica.   Doubt cauda equina, pyelonephritis or other emergent cause of her symptoms.  Equal and intact distal pulses.  We will have her follow-up with PCP and return for worsening symptoms.  All imaging, if done today, including plain films, CT scans, and ultrasounds, independently reviewed by me, and interpretations confirmed via formal radiology reads.  Patient is hemodynamically stable, in NAD, and able to ambulate in the ED. Evaluation does not show pathology that would require ongoing emergent intervention or inpatient treatment. I explained the diagnosis to the patient. Pain has been managed and has no complaints prior to discharge. Patient is comfortable with above plan and is stable for discharge at this time. All questions were answered prior to disposition. Strict return precautions for returning to the ED were discussed. Encouraged follow up with PCP.   An After Visit Summary was printed and given to the patient.   Portions of this note were generated with Lobbyist. Dictation errors may occur despite best attempts at proofreading.  Final Clinical Impression(s) / ED Diagnoses Final diagnoses:  Chronic right-sided low back pain with right-sided sciatica    Rx / DC Orders ED Discharge Orders    None       Delia Heady, PA-C 09/13/19 [place]    Gareth Morgan, MD 09/15/19 [phone]

## 2019-09-13 NOTE — ED Notes (Signed)
Pateint ambulated to RR and back w/o assistance, verbalized minor discomfort, steady gait.

## 2019-09-13 NOTE — Discharge Instructions (Signed)
Continue taking Tylenol as needed to help with pain. Follow up with your primary care provider. Return to the ED if you experience worsening pain, abdominal pain, injuries or falls, numbness in arms or legs.

## 2019-09-13 NOTE — ED Notes (Signed)
Pt provided pillows for more comfort. Pt placed on side to relieve some of her back pain.

## 2019-09-13 NOTE — ED Notes (Signed)
[phone], Dr Guadlupe Spanish, patient's daughter, call for updates.

## 2019-09-13 NOTE — ED Triage Notes (Signed)
Arrives via EMS from home, C/C back pain. Hx of DDD and previous back surgeries, hx of dementia. Ambulatory with walker, denies any falls or trauma. Patient is on gabapentin. Vitals, BP 140/60, P 60, O2 97%.

## 2019-11-05 ENCOUNTER — Other Ambulatory Visit: Payer: Self-pay | Admitting: Internal Medicine

## 2019-11-05 DIAGNOSIS — I25810 Atherosclerosis of coronary artery bypass graft(s) without angina pectoris: Secondary | ICD-10-CM

## 2019-11-09 DIAGNOSIS — M62830 Muscle spasm of back: Secondary | ICD-10-CM | POA: Diagnosis not present

## 2019-11-09 DIAGNOSIS — M545 Low back pain: Secondary | ICD-10-CM | POA: Diagnosis not present

## 2019-11-12 ENCOUNTER — Other Ambulatory Visit: Payer: Self-pay | Admitting: Internal Medicine

## 2019-11-22 ENCOUNTER — Telehealth: Payer: Self-pay

## 2019-11-22 NOTE — Telephone Encounter (Signed)
New message    POA - Dr. Guadlupe Spanish checking on the status of orders from [facility] care sent on  8.2.21 .   Product manager # [phone]

## 2019-11-22 NOTE — Telephone Encounter (Signed)
I received them today - they will be faxed back today

## 2019-11-23 DIAGNOSIS — M6281 Muscle weakness (generalized): Secondary | ICD-10-CM | POA: Diagnosis not present

## 2019-11-23 DIAGNOSIS — M545 Low back pain: Secondary | ICD-10-CM | POA: Diagnosis not present

## 2019-11-23 DIAGNOSIS — M25562 Pain in left knee: Secondary | ICD-10-CM | POA: Diagnosis not present

## 2019-11-23 DIAGNOSIS — M25561 Pain in right knee: Secondary | ICD-10-CM | POA: Diagnosis not present

## 2019-11-23 NOTE — Telephone Encounter (Signed)
Called Dr. [name] Heck back no answer LMOM w/MD response.Marland KitchenJohny Chess

## 2019-11-24 DIAGNOSIS — R41841 Cognitive communication deficit: Secondary | ICD-10-CM | POA: Diagnosis not present

## 2019-11-24 DIAGNOSIS — M25562 Pain in left knee: Secondary | ICD-10-CM | POA: Diagnosis not present

## 2019-11-24 DIAGNOSIS — M25561 Pain in right knee: Secondary | ICD-10-CM | POA: Diagnosis not present

## 2019-11-24 DIAGNOSIS — M6281 Muscle weakness (generalized): Secondary | ICD-10-CM | POA: Diagnosis not present

## 2019-11-24 DIAGNOSIS — R488 Other symbolic dysfunctions: Secondary | ICD-10-CM | POA: Diagnosis not present

## 2019-11-24 DIAGNOSIS — M545 Low back pain: Secondary | ICD-10-CM | POA: Diagnosis not present

## 2019-11-25 ENCOUNTER — Other Ambulatory Visit: Payer: Self-pay | Admitting: Internal Medicine

## 2019-11-25 ENCOUNTER — Other Ambulatory Visit: Payer: Self-pay | Admitting: Neurology

## 2019-11-27 DIAGNOSIS — R41841 Cognitive communication deficit: Secondary | ICD-10-CM | POA: Diagnosis not present

## 2019-11-27 DIAGNOSIS — M6281 Muscle weakness (generalized): Secondary | ICD-10-CM | POA: Diagnosis not present

## 2019-11-27 DIAGNOSIS — M25561 Pain in right knee: Secondary | ICD-10-CM | POA: Diagnosis not present

## 2019-11-27 DIAGNOSIS — M25562 Pain in left knee: Secondary | ICD-10-CM | POA: Diagnosis not present

## 2019-11-27 DIAGNOSIS — M545 Low back pain: Secondary | ICD-10-CM | POA: Diagnosis not present

## 2019-11-27 DIAGNOSIS — R488 Other symbolic dysfunctions: Secondary | ICD-10-CM | POA: Diagnosis not present

## 2019-11-28 DIAGNOSIS — M25561 Pain in right knee: Secondary | ICD-10-CM | POA: Diagnosis not present

## 2019-11-28 DIAGNOSIS — M6281 Muscle weakness (generalized): Secondary | ICD-10-CM | POA: Diagnosis not present

## 2019-11-28 DIAGNOSIS — R488 Other symbolic dysfunctions: Secondary | ICD-10-CM | POA: Diagnosis not present

## 2019-11-28 DIAGNOSIS — R41841 Cognitive communication deficit: Secondary | ICD-10-CM | POA: Diagnosis not present

## 2019-11-28 DIAGNOSIS — M545 Low back pain: Secondary | ICD-10-CM | POA: Diagnosis not present

## 2019-11-28 DIAGNOSIS — M25562 Pain in left knee: Secondary | ICD-10-CM | POA: Diagnosis not present

## 2019-11-29 DIAGNOSIS — M545 Low back pain: Secondary | ICD-10-CM | POA: Diagnosis not present

## 2019-11-29 DIAGNOSIS — R41841 Cognitive communication deficit: Secondary | ICD-10-CM | POA: Diagnosis not present

## 2019-11-29 DIAGNOSIS — M6281 Muscle weakness (generalized): Secondary | ICD-10-CM | POA: Diagnosis not present

## 2019-11-29 DIAGNOSIS — M25561 Pain in right knee: Secondary | ICD-10-CM | POA: Diagnosis not present

## 2019-11-29 DIAGNOSIS — M25562 Pain in left knee: Secondary | ICD-10-CM | POA: Diagnosis not present

## 2019-11-29 DIAGNOSIS — R488 Other symbolic dysfunctions: Secondary | ICD-10-CM | POA: Diagnosis not present

## 2019-11-30 DIAGNOSIS — M25561 Pain in right knee: Secondary | ICD-10-CM | POA: Diagnosis not present

## 2019-11-30 DIAGNOSIS — M25562 Pain in left knee: Secondary | ICD-10-CM | POA: Diagnosis not present

## 2019-11-30 DIAGNOSIS — R41841 Cognitive communication deficit: Secondary | ICD-10-CM | POA: Diagnosis not present

## 2019-11-30 DIAGNOSIS — R488 Other symbolic dysfunctions: Secondary | ICD-10-CM | POA: Diagnosis not present

## 2019-11-30 DIAGNOSIS — M6281 Muscle weakness (generalized): Secondary | ICD-10-CM | POA: Diagnosis not present

## 2019-11-30 DIAGNOSIS — M545 Low back pain: Secondary | ICD-10-CM | POA: Diagnosis not present

## 2019-12-01 DIAGNOSIS — R41841 Cognitive communication deficit: Secondary | ICD-10-CM | POA: Diagnosis not present

## 2019-12-01 DIAGNOSIS — M545 Low back pain: Secondary | ICD-10-CM | POA: Diagnosis not present

## 2019-12-01 DIAGNOSIS — M25561 Pain in right knee: Secondary | ICD-10-CM | POA: Diagnosis not present

## 2019-12-01 DIAGNOSIS — M25562 Pain in left knee: Secondary | ICD-10-CM | POA: Diagnosis not present

## 2019-12-01 DIAGNOSIS — M6281 Muscle weakness (generalized): Secondary | ICD-10-CM | POA: Diagnosis not present

## 2019-12-01 DIAGNOSIS — R488 Other symbolic dysfunctions: Secondary | ICD-10-CM | POA: Diagnosis not present

## 2019-12-04 DIAGNOSIS — M25562 Pain in left knee: Secondary | ICD-10-CM | POA: Diagnosis not present

## 2019-12-04 DIAGNOSIS — R488 Other symbolic dysfunctions: Secondary | ICD-10-CM | POA: Diagnosis not present

## 2019-12-04 DIAGNOSIS — M6281 Muscle weakness (generalized): Secondary | ICD-10-CM | POA: Diagnosis not present

## 2019-12-04 DIAGNOSIS — M25561 Pain in right knee: Secondary | ICD-10-CM | POA: Diagnosis not present

## 2019-12-04 DIAGNOSIS — R41841 Cognitive communication deficit: Secondary | ICD-10-CM | POA: Diagnosis not present

## 2019-12-04 DIAGNOSIS — M545 Low back pain: Secondary | ICD-10-CM | POA: Diagnosis not present

## 2019-12-05 DIAGNOSIS — M6281 Muscle weakness (generalized): Secondary | ICD-10-CM | POA: Diagnosis not present

## 2019-12-05 DIAGNOSIS — M25561 Pain in right knee: Secondary | ICD-10-CM | POA: Diagnosis not present

## 2019-12-05 DIAGNOSIS — R488 Other symbolic dysfunctions: Secondary | ICD-10-CM | POA: Diagnosis not present

## 2019-12-05 DIAGNOSIS — M545 Low back pain: Secondary | ICD-10-CM | POA: Diagnosis not present

## 2019-12-05 DIAGNOSIS — M25562 Pain in left knee: Secondary | ICD-10-CM | POA: Diagnosis not present

## 2019-12-05 DIAGNOSIS — R41841 Cognitive communication deficit: Secondary | ICD-10-CM | POA: Diagnosis not present

## 2019-12-06 DIAGNOSIS — M25561 Pain in right knee: Secondary | ICD-10-CM | POA: Diagnosis not present

## 2019-12-06 DIAGNOSIS — R488 Other symbolic dysfunctions: Secondary | ICD-10-CM | POA: Diagnosis not present

## 2019-12-06 DIAGNOSIS — M545 Low back pain: Secondary | ICD-10-CM | POA: Diagnosis not present

## 2019-12-06 DIAGNOSIS — M25562 Pain in left knee: Secondary | ICD-10-CM | POA: Diagnosis not present

## 2019-12-06 DIAGNOSIS — M6281 Muscle weakness (generalized): Secondary | ICD-10-CM | POA: Diagnosis not present

## 2019-12-06 DIAGNOSIS — R41841 Cognitive communication deficit: Secondary | ICD-10-CM | POA: Diagnosis not present

## 2019-12-07 DIAGNOSIS — M25562 Pain in left knee: Secondary | ICD-10-CM | POA: Diagnosis not present

## 2019-12-07 DIAGNOSIS — M545 Low back pain: Secondary | ICD-10-CM | POA: Diagnosis not present

## 2019-12-07 DIAGNOSIS — M25561 Pain in right knee: Secondary | ICD-10-CM | POA: Diagnosis not present

## 2019-12-07 DIAGNOSIS — R488 Other symbolic dysfunctions: Secondary | ICD-10-CM | POA: Diagnosis not present

## 2019-12-07 DIAGNOSIS — R41841 Cognitive communication deficit: Secondary | ICD-10-CM | POA: Diagnosis not present

## 2019-12-07 DIAGNOSIS — M6281 Muscle weakness (generalized): Secondary | ICD-10-CM | POA: Diagnosis not present

## 2019-12-08 DIAGNOSIS — M545 Low back pain: Secondary | ICD-10-CM | POA: Diagnosis not present

## 2019-12-08 DIAGNOSIS — M25562 Pain in left knee: Secondary | ICD-10-CM | POA: Diagnosis not present

## 2019-12-08 DIAGNOSIS — R41841 Cognitive communication deficit: Secondary | ICD-10-CM | POA: Diagnosis not present

## 2019-12-08 DIAGNOSIS — M25561 Pain in right knee: Secondary | ICD-10-CM | POA: Diagnosis not present

## 2019-12-08 DIAGNOSIS — M6281 Muscle weakness (generalized): Secondary | ICD-10-CM | POA: Diagnosis not present

## 2019-12-08 DIAGNOSIS — R488 Other symbolic dysfunctions: Secondary | ICD-10-CM | POA: Diagnosis not present

## 2019-12-10 ENCOUNTER — Other Ambulatory Visit: Payer: Self-pay | Admitting: Internal Medicine

## 2019-12-11 DIAGNOSIS — M6281 Muscle weakness (generalized): Secondary | ICD-10-CM | POA: Diagnosis not present

## 2019-12-11 DIAGNOSIS — R41841 Cognitive communication deficit: Secondary | ICD-10-CM | POA: Diagnosis not present

## 2019-12-11 DIAGNOSIS — M25562 Pain in left knee: Secondary | ICD-10-CM | POA: Diagnosis not present

## 2019-12-11 DIAGNOSIS — M25561 Pain in right knee: Secondary | ICD-10-CM | POA: Diagnosis not present

## 2019-12-11 DIAGNOSIS — M545 Low back pain: Secondary | ICD-10-CM | POA: Diagnosis not present

## 2019-12-11 DIAGNOSIS — R488 Other symbolic dysfunctions: Secondary | ICD-10-CM | POA: Diagnosis not present

## 2019-12-12 DIAGNOSIS — M545 Low back pain: Secondary | ICD-10-CM | POA: Diagnosis not present

## 2019-12-12 DIAGNOSIS — M25562 Pain in left knee: Secondary | ICD-10-CM | POA: Diagnosis not present

## 2019-12-12 DIAGNOSIS — M25561 Pain in right knee: Secondary | ICD-10-CM | POA: Diagnosis not present

## 2019-12-12 DIAGNOSIS — R488 Other symbolic dysfunctions: Secondary | ICD-10-CM | POA: Diagnosis not present

## 2019-12-12 DIAGNOSIS — M6281 Muscle weakness (generalized): Secondary | ICD-10-CM | POA: Diagnosis not present

## 2019-12-12 DIAGNOSIS — R41841 Cognitive communication deficit: Secondary | ICD-10-CM | POA: Diagnosis not present

## 2019-12-13 DIAGNOSIS — R488 Other symbolic dysfunctions: Secondary | ICD-10-CM | POA: Diagnosis not present

## 2019-12-13 DIAGNOSIS — M6281 Muscle weakness (generalized): Secondary | ICD-10-CM | POA: Diagnosis not present

## 2019-12-13 DIAGNOSIS — M25561 Pain in right knee: Secondary | ICD-10-CM | POA: Diagnosis not present

## 2019-12-13 DIAGNOSIS — R41841 Cognitive communication deficit: Secondary | ICD-10-CM | POA: Diagnosis not present

## 2019-12-13 DIAGNOSIS — M25562 Pain in left knee: Secondary | ICD-10-CM | POA: Diagnosis not present

## 2019-12-13 DIAGNOSIS — M545 Low back pain: Secondary | ICD-10-CM | POA: Diagnosis not present

## 2019-12-14 DIAGNOSIS — R41841 Cognitive communication deficit: Secondary | ICD-10-CM | POA: Diagnosis not present

## 2019-12-14 DIAGNOSIS — M25562 Pain in left knee: Secondary | ICD-10-CM | POA: Diagnosis not present

## 2019-12-14 DIAGNOSIS — M25561 Pain in right knee: Secondary | ICD-10-CM | POA: Diagnosis not present

## 2019-12-14 DIAGNOSIS — M545 Low back pain: Secondary | ICD-10-CM | POA: Diagnosis not present

## 2019-12-14 DIAGNOSIS — M6281 Muscle weakness (generalized): Secondary | ICD-10-CM | POA: Diagnosis not present

## 2019-12-14 DIAGNOSIS — R488 Other symbolic dysfunctions: Secondary | ICD-10-CM | POA: Diagnosis not present

## 2019-12-15 DIAGNOSIS — R41841 Cognitive communication deficit: Secondary | ICD-10-CM | POA: Diagnosis not present

## 2019-12-15 DIAGNOSIS — M25561 Pain in right knee: Secondary | ICD-10-CM | POA: Diagnosis not present

## 2019-12-15 DIAGNOSIS — M6281 Muscle weakness (generalized): Secondary | ICD-10-CM | POA: Diagnosis not present

## 2019-12-15 DIAGNOSIS — M25562 Pain in left knee: Secondary | ICD-10-CM | POA: Diagnosis not present

## 2019-12-15 DIAGNOSIS — R488 Other symbolic dysfunctions: Secondary | ICD-10-CM | POA: Diagnosis not present

## 2019-12-15 DIAGNOSIS — M545 Low back pain: Secondary | ICD-10-CM | POA: Diagnosis not present

## 2019-12-18 DIAGNOSIS — R488 Other symbolic dysfunctions: Secondary | ICD-10-CM | POA: Diagnosis not present

## 2019-12-18 DIAGNOSIS — M25562 Pain in left knee: Secondary | ICD-10-CM | POA: Diagnosis not present

## 2019-12-18 DIAGNOSIS — R41841 Cognitive communication deficit: Secondary | ICD-10-CM | POA: Diagnosis not present

## 2019-12-18 DIAGNOSIS — M545 Low back pain: Secondary | ICD-10-CM | POA: Diagnosis not present

## 2019-12-18 DIAGNOSIS — M6281 Muscle weakness (generalized): Secondary | ICD-10-CM | POA: Diagnosis not present

## 2019-12-18 DIAGNOSIS — M25561 Pain in right knee: Secondary | ICD-10-CM | POA: Diagnosis not present

## 2019-12-19 DIAGNOSIS — M545 Low back pain: Secondary | ICD-10-CM | POA: Diagnosis not present

## 2019-12-19 DIAGNOSIS — R41841 Cognitive communication deficit: Secondary | ICD-10-CM | POA: Diagnosis not present

## 2019-12-19 DIAGNOSIS — M6281 Muscle weakness (generalized): Secondary | ICD-10-CM | POA: Diagnosis not present

## 2019-12-19 DIAGNOSIS — M25562 Pain in left knee: Secondary | ICD-10-CM | POA: Diagnosis not present

## 2019-12-19 DIAGNOSIS — R488 Other symbolic dysfunctions: Secondary | ICD-10-CM | POA: Diagnosis not present

## 2019-12-19 DIAGNOSIS — M25561 Pain in right knee: Secondary | ICD-10-CM | POA: Diagnosis not present

## 2019-12-20 DIAGNOSIS — R41841 Cognitive communication deficit: Secondary | ICD-10-CM | POA: Diagnosis not present

## 2019-12-20 DIAGNOSIS — M6281 Muscle weakness (generalized): Secondary | ICD-10-CM | POA: Diagnosis not present

## 2019-12-20 DIAGNOSIS — M545 Low back pain: Secondary | ICD-10-CM | POA: Diagnosis not present

## 2019-12-20 DIAGNOSIS — R488 Other symbolic dysfunctions: Secondary | ICD-10-CM | POA: Diagnosis not present

## 2019-12-20 DIAGNOSIS — M25561 Pain in right knee: Secondary | ICD-10-CM | POA: Diagnosis not present

## 2019-12-20 DIAGNOSIS — M25562 Pain in left knee: Secondary | ICD-10-CM | POA: Diagnosis not present

## 2019-12-21 DIAGNOSIS — M545 Low back pain: Secondary | ICD-10-CM | POA: Diagnosis not present

## 2019-12-21 DIAGNOSIS — R488 Other symbolic dysfunctions: Secondary | ICD-10-CM | POA: Diagnosis not present

## 2019-12-21 DIAGNOSIS — M6281 Muscle weakness (generalized): Secondary | ICD-10-CM | POA: Diagnosis not present

## 2019-12-21 DIAGNOSIS — M25562 Pain in left knee: Secondary | ICD-10-CM | POA: Diagnosis not present

## 2019-12-21 DIAGNOSIS — M25561 Pain in right knee: Secondary | ICD-10-CM | POA: Diagnosis not present

## 2019-12-21 DIAGNOSIS — R41841 Cognitive communication deficit: Secondary | ICD-10-CM | POA: Diagnosis not present

## 2019-12-22 DIAGNOSIS — M6281 Muscle weakness (generalized): Secondary | ICD-10-CM | POA: Diagnosis not present

## 2019-12-22 DIAGNOSIS — R41841 Cognitive communication deficit: Secondary | ICD-10-CM | POA: Diagnosis not present

## 2019-12-22 DIAGNOSIS — M545 Low back pain: Secondary | ICD-10-CM | POA: Diagnosis not present

## 2019-12-22 DIAGNOSIS — R488 Other symbolic dysfunctions: Secondary | ICD-10-CM | POA: Diagnosis not present

## 2019-12-22 DIAGNOSIS — M25561 Pain in right knee: Secondary | ICD-10-CM | POA: Diagnosis not present

## 2019-12-22 DIAGNOSIS — M25562 Pain in left knee: Secondary | ICD-10-CM | POA: Diagnosis not present

## 2019-12-25 DIAGNOSIS — R488 Other symbolic dysfunctions: Secondary | ICD-10-CM | POA: Diagnosis not present

## 2019-12-25 DIAGNOSIS — M6281 Muscle weakness (generalized): Secondary | ICD-10-CM | POA: Diagnosis not present

## 2019-12-25 DIAGNOSIS — M545 Low back pain: Secondary | ICD-10-CM | POA: Diagnosis not present

## 2019-12-25 DIAGNOSIS — M25561 Pain in right knee: Secondary | ICD-10-CM | POA: Diagnosis not present

## 2019-12-25 DIAGNOSIS — R41841 Cognitive communication deficit: Secondary | ICD-10-CM | POA: Diagnosis not present

## 2019-12-25 DIAGNOSIS — M25562 Pain in left knee: Secondary | ICD-10-CM | POA: Diagnosis not present

## 2019-12-26 ENCOUNTER — Other Ambulatory Visit: Payer: Self-pay | Admitting: Neurology

## 2019-12-26 DIAGNOSIS — M25562 Pain in left knee: Secondary | ICD-10-CM | POA: Diagnosis not present

## 2019-12-26 DIAGNOSIS — M545 Low back pain: Secondary | ICD-10-CM | POA: Diagnosis not present

## 2019-12-26 DIAGNOSIS — M6281 Muscle weakness (generalized): Secondary | ICD-10-CM | POA: Diagnosis not present

## 2019-12-26 DIAGNOSIS — M25561 Pain in right knee: Secondary | ICD-10-CM | POA: Diagnosis not present

## 2019-12-26 DIAGNOSIS — R488 Other symbolic dysfunctions: Secondary | ICD-10-CM | POA: Diagnosis not present

## 2019-12-26 DIAGNOSIS — R41841 Cognitive communication deficit: Secondary | ICD-10-CM | POA: Diagnosis not present

## 2019-12-27 DIAGNOSIS — M25561 Pain in right knee: Secondary | ICD-10-CM | POA: Diagnosis not present

## 2019-12-27 DIAGNOSIS — R488 Other symbolic dysfunctions: Secondary | ICD-10-CM | POA: Diagnosis not present

## 2019-12-27 DIAGNOSIS — M545 Low back pain: Secondary | ICD-10-CM | POA: Diagnosis not present

## 2019-12-27 DIAGNOSIS — M25562 Pain in left knee: Secondary | ICD-10-CM | POA: Diagnosis not present

## 2019-12-27 DIAGNOSIS — R41841 Cognitive communication deficit: Secondary | ICD-10-CM | POA: Diagnosis not present

## 2019-12-27 DIAGNOSIS — M6281 Muscle weakness (generalized): Secondary | ICD-10-CM | POA: Diagnosis not present

## 2019-12-27 NOTE — Progress Notes (Signed)
Subjective:    Patient ID: [name], female    DOB: [birth year], 86 y.o.   MRN: [id number]  HPI The patient is here for an acute visit confusion, check sugar, weight gain.  She is here with her daughter who helps provide the history given patient's mild dementia.  Since she was here last she did lose her last of 9 siblings.  That has been difficult for her.  She also moved into [facility].  She likes where she is living, but it is a change and she is interesting.  Her daughter is concerned because it has been some depression and some increased and sundowning.  She was having some of the sundowning prior to moving and knows all of the changes are likely contributing.  Her daughter sets up her medications for her and she takes them.  She has been sleeping well.  She has been eating well.  She is currently doing physical therapy and speech therapy.     Medications and allergies reviewed with patient and updated if appropriate.  Patient Active Problem List   Diagnosis Date Noted  . Mild dementia ([place]) 07/27/2017  . Chronic RUQ pain 05/28/2016  . Right knee pain 06/14/2015  . Diabetic neuropathy ([place]) 03/21/2014  . Bunion 03/21/2014  . PVD (pulmonary valve disease) 01/23/2014  . Onychomycosis 12/20/2013  . PVD (peripheral vascular disease) ([place]) 12/19/2013  . Gout 12/13/2013  . Depression   . Spinal stenosis of lumbar region 04/24/2013  . Arthritis of shoulder region, left 09/11/2011  . Type II diabetes mellitus with neurological manifestations ([place]) 01/17/2009  . Anemia 10/15/2008  . CAD, NATIVE VESSEL 06/09/2008  . Hyperlipidemia 06/15/2007  . Essential hypertension 06/15/2007  . GERD 06/15/2007  . Fibromyalgia syndrome 06/15/2007  . 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 DIVERTICULOSIS, 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    Current Outpatient Medications on File Prior to Visit  Medication Sig Dispense Refill  . acetaminophen (TYLENOL) 500 MG tablet Take 500 mg by  mouth every 6 (six) hours as needed.    Marland Kitchen amLODipine (NORVASC) 5 MG tablet Take 1 tablet (5 mg total) by mouth 2 (two) times daily. 180 tablet 1  . aspirin EC 81 MG tablet Take 81 mg by mouth daily.    Marland Kitchen atorvastatin (LIPITOR) 40 MG tablet TAKE ONE TABLET BY MOUTH DAILY 90 tablet 2  . Biotin 1 MG CAPS Take 1 capsule by mouth daily.    . busPIRone (BUSPAR) 5 MG tablet TAKE ONE TABLET BY MOUTH TWICE A DAY 60 tablet 0  . carvedilol (COREG) 6.25 MG tablet TAKE ONE TABLET BY MOUTH TWICE A DAY. Please make overdue appt with Dr. Harrington Challenger before anymore refills. 1st attempt 60 tablet 0  . cetirizine (ZYRTEC) 10 MG tablet TAKE ONE TABLET BY MOUTH DAILY (Patient taking differently: Take 10 mg by mouth at bedtime. ) 90 tablet 3  . Cholecalciferol (D3-1000) 25 MCG (1000 UT) capsule Take 1,000 Units by mouth daily.    . diclofenac sodium (VOLTAREN) 1 % GEL Apply 2 g topically 4 (four) times daily as needed (pain). 100 g 5  . donepezil (ARICEPT) 10 MG tablet Take 1 tablet daily 90 tablet 3  . DULoxetine (CYMBALTA) 60 MG capsule TAKE ONE CAPSULE BY MOUTH DAILY 90 capsule 2  . gabapentin (NEURONTIN) 300 MG capsule TAKE ONE CAPSULE BY MOUTH EVERY NIGHT AT BEDTIME 90 capsule 3  . meloxicam (MOBIC) 7.5 MG tablet TAKE ONE TABLET BY MOUTH DAILY 90 tablet 1  . memantine (  NAMENDA) 10 MG tablet Take 1 tablet (10 mg total) by mouth 2 (two) times daily. 180 tablet 3  . Multiple Vitamins-Minerals (ZINC PO) Take 1 tablet by mouth daily.    Marland Kitchen omeprazole (PRILOSEC) 40 MG capsule TAKE 1 CAPSULE(S) BY MOUTH DAILY 90 capsule 1  . ramipril (ALTACE) 10 MG capsule Take 1 capsule (10 mg total) by mouth 2 (two) times daily. 180 capsule 1   No current facility-administered medications on file prior to visit.    Past Medical History:  Diagnosis Date  . CAD, NATIVE VESSEL 06/09/2008  . DIABETES MELLITUS, TYPE II   . FIBROMYALGIA   . GERD   . Gout   . HIATAL HERNIA   . HYPERLIPIDEMIA   . HYPERTENSION   . Insomnia   . OA  (osteoarthritis)    severe, shoulders, hands - inflammatory, ?RA  . S/P CABG x 1   . S/P inguinal hernia repair   . TUBULOVILLOUS ADENOMA, 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   colo 04/2012 - no polyps - no further screening planned (age >82)  . UNSPECIFIED ANEMIA     Past Surgical History:  Procedure Laterality Date  . ABDOMINAL HYSTERECTOMY  1981  . APPENDECTOMY    . CATARACT EXTRACTION, BILATERAL    . CORONARY ARTERY BYPASS GRAFT  2010  . HERNIA REPAIR     INGUINAL  . I & D EXTREMITY Left 12/07/2017   Procedure: IRRIGATION AND DEBRIDEMENT EXTREMITY WITH FOREIGN BODY REMOVAL;  Surgeon: Nicholes Stairs, MD;  Location: [facility];  Service: Orthopedics;  Laterality: Left;  . JOINT REPLACEMENT    . OVARIAN CYST REMOVAL    . REPLACEMENT TOTAL KNEE BILATERAL Bilateral    Rt-1974, Lt -1989  . REVERSE SHOULDER ARTHROPLASTY  09/08/2011   Procedure: REVERSE SHOULDER ARTHROPLASTY;  Surgeon: Nita Sells, MD;  Location: [facility];  Service: Orthopedics;  Laterality: Left;  left reverse total shoulder arthroplasty  . ROTATOR CUFF REPAIR     Left    Social History   Socioeconomic History  . Marital status: Widowed    Spouse name: Not on file  . Number of children: Not on file  . Years of education: Not on file  . Highest education level: Not on file  Occupational History  . Not on file  Tobacco Use  . Smoking status: Former Smoker    Quit date: 04/26/1970    Years since quitting: 49.7  . Smokeless tobacco: Never Used  Vaping Use  . Vaping Use: Never used  Substance and Sexual Activity  . Alcohol use: Yes    Alcohol/week: 1.0 standard drink    Types: 1 Glasses of wine per week  . Drug use: No  . Sexual activity: Not on file  Other Topics Concern  . Not on file  Social History Narrative   Widows, lives alone. 2 sons. 2 daughters. Retired Ambulance person   Social Determinants of Radio broadcast assistant Strain:   . Difficulty of Paying Living Expenses: Not on file  Food Insecurity:     . Worried About Charity fundraiser in the Last Year: Not on file  . Ran Out of Food in the Last Year: Not on file  Transportation Needs:   . Lack of Transportation (Medical): Not on file  . Lack of Transportation (Non-Medical): Not on file  Physical Activity:   . Days of Exercise per Week: Not on file  . Minutes of Exercise per Session: Not on file  Stress:   . Feeling  of Stress : Not on file  Social Connections:   . Frequency of Communication with Friends and Family: Not on file  . Frequency of Social Gatherings with Friends and Family: Not on file  . Attends Religious Services: Not on file  . Active Member of Clubs or Organizations: Not on file  . Attends Archivist Meetings: Not on file  . Marital Status: Not on file    Family History  Problem Relation Age of Onset  . Cancer Mother        Stomach  . Stomach cancer Mother 25  . Hypertension Mother   . Heart disease Father 79       MI  . Diabetes Father   . Heart attack Father   . Diabetes Daughter   . Hypertension Daughter   . Vascular Disease Sister   . Stroke Sister   . Dementia Sister   . Diabetes Sister   . Parkinsonism Sister   . Stroke Sister   . Heart disease Sister   . Heart attack Brother   . Anesthesia problems Neg Hx   . Colon cancer Neg Hx     Review of Systems  Constitutional: Negative for fever.  Respiratory: Negative for cough, shortness of breath and wheezing.   Cardiovascular: Negative for chest pain, palpitations and leg swelling.  Musculoskeletal: Positive for back pain (R lower back - chronic).  Neurological: Negative for light-headedness and headaches.  Psychiatric/Behavioral: Positive for confusion.       Objective:   Vitals:   12/28/19 0939  BP: 130/86  Pulse: 64  Temp: (!) 97.5 F (36.4 C)  SpO2: 95%   BP Readings from Last 3 Encounters:  12/28/19 130/86  09/13/19 (!) 127/59  08/03/19 (!) 160/68   Wt Readings from Last 3 Encounters:  12/28/19 154 lb 9.6 oz  (70.1 kg)  09/13/19 154 lb 5.2 oz (70 kg)  08/03/19 155 lb (70.3 kg)   Body mass index is 31.23 kg/m.   Physical Exam    Constitutional: Appears well-developed and well-nourished. No distress.  Head: Normocephalic and atraumatic.  Neck: Neck supple. No tracheal deviation present. No thyromegaly present.  No cervical lymphadenopathy Cardiovascular: Normal rate, regular rhythm and normal heart sounds.  No murmur heard. No carotid bruit .  No edema Pulmonary/Chest: Effort normal and breath sounds normal. No respiratory distress. No has no wheezes. No rales.  Skin: Skin is warm and dry. Not diaphoretic.  Psychiatric: Normal mood and affect. Behavior is normal.       Assessment & Plan:    See Problem List for Assessment and Plan of chronic medical problems.    This visit occurred during the SARS-CoV-2 public health emergency.  Safety protocols were in place, including screening questions prior to the visit, additional usage of staff PPE, and extensive cleaning of exam room while observing appropriate contact time as indicated for disinfecting solutions.

## 2019-12-28 ENCOUNTER — Other Ambulatory Visit: Payer: Self-pay | Admitting: Neurology

## 2019-12-28 ENCOUNTER — Other Ambulatory Visit: Payer: Self-pay

## 2019-12-28 ENCOUNTER — Other Ambulatory Visit: Payer: Self-pay | Admitting: Internal Medicine

## 2019-12-28 ENCOUNTER — Ambulatory Visit (INDEPENDENT_AMBULATORY_CARE_PROVIDER_SITE_OTHER): Payer: Medicare Other | Admitting: Internal Medicine

## 2019-12-28 ENCOUNTER — Encounter: Payer: Self-pay | Admitting: Internal Medicine

## 2019-12-28 VITALS — BP 130/86 | HR 64 | Temp 97.5°F | Wt 154.6 lb

## 2019-12-28 DIAGNOSIS — I25810 Atherosclerosis of coronary artery bypass graft(s) without angina pectoris: Secondary | ICD-10-CM | POA: Diagnosis not present

## 2019-12-28 DIAGNOSIS — M25562 Pain in left knee: Secondary | ICD-10-CM | POA: Diagnosis not present

## 2019-12-28 DIAGNOSIS — E1149 Type 2 diabetes mellitus with other diabetic neurological complication: Secondary | ICD-10-CM | POA: Diagnosis not present

## 2019-12-28 DIAGNOSIS — F03A0 Unspecified dementia, mild, without behavioral disturbance, psychotic disturbance, mood disturbance, and anxiety: Secondary | ICD-10-CM

## 2019-12-28 DIAGNOSIS — R488 Other symbolic dysfunctions: Secondary | ICD-10-CM | POA: Diagnosis not present

## 2019-12-28 DIAGNOSIS — F0390 Unspecified dementia without behavioral disturbance: Secondary | ICD-10-CM | POA: Diagnosis not present

## 2019-12-28 DIAGNOSIS — E782 Mixed hyperlipidemia: Secondary | ICD-10-CM

## 2019-12-28 DIAGNOSIS — I10 Essential (primary) hypertension: Secondary | ICD-10-CM | POA: Diagnosis not present

## 2019-12-28 DIAGNOSIS — R41841 Cognitive communication deficit: Secondary | ICD-10-CM | POA: Diagnosis not present

## 2019-12-28 DIAGNOSIS — M545 Low back pain: Secondary | ICD-10-CM | POA: Diagnosis not present

## 2019-12-28 DIAGNOSIS — M25561 Pain in right knee: Secondary | ICD-10-CM | POA: Diagnosis not present

## 2019-12-28 DIAGNOSIS — F3289 Other specified depressive episodes: Secondary | ICD-10-CM

## 2019-12-28 DIAGNOSIS — E0841 Diabetes mellitus due to underlying condition with diabetic mononeuropathy: Secondary | ICD-10-CM

## 2019-12-28 DIAGNOSIS — M6281 Muscle weakness (generalized): Secondary | ICD-10-CM | POA: Diagnosis not present

## 2019-12-28 NOTE — Assessment & Plan Note (Signed)
Chronic BP well controlled Current regimen effective and well tolerated Continue current medications at current doses cmp  

## 2019-12-28 NOTE — Assessment & Plan Note (Signed)
Chronic Taking Cymbalta 60 mg daily Has had some increased dementia related to the last of her 9 siblings dying.  Also going through big changes with having to move into a new apartment Discussed that we could consider increasing the Cymbalta

## 2019-12-28 NOTE — Assessment & Plan Note (Signed)
Chronic Taking Lipitor 40 mg daily Lipids have been well controlled-we will recheck today

## 2019-12-28 NOTE — Assessment & Plan Note (Signed)
Chronic Following with Dr. Delice Lesch On Aricept and Namenda Also taking BuSpar twice daily Having increased sundowning Discussed possibly trying to increase BuSpar to 3 times a day when moving up evening dose-her daughter will have to see if that something that would work with her new pill dispenser check basic blood work to rule out other causes Can consider increasing Cymbalta

## 2019-12-28 NOTE — Assessment & Plan Note (Signed)
Chronic Diet controlled Check A1c  

## 2019-12-28 NOTE — Assessment & Plan Note (Signed)
Chronic Sugars have been well controlled Taking Cymbalta and gabapentin Continue

## 2019-12-28 NOTE — Patient Instructions (Addendum)
  Blood work was ordered.   ° ° °Medications reviewed and updated.  Changes include :   none ° ° ° °Please followup in 6 months ° ° °

## 2019-12-29 LAB — COMPLETE METABOLIC PANEL WITH GFR
AG Ratio: 1.6 (calc) (ref 1.0–2.5)
ALT: 13 U/L (ref 6–29)
AST: 21 U/L (ref 10–35)
Albumin: 4.2 g/dL (ref 3.6–5.1)
Alkaline phosphatase (APISO): 85 U/L (ref 37–153)
BUN/Creatinine Ratio: 22 (calc) (ref 6–22)
BUN: 21 mg/dL (ref 7–25)
CO2: 27 mmol/L (ref 20–32)
Calcium: 9.4 mg/dL (ref 8.6–10.4)
Chloride: 102 mmol/L (ref 98–110)
Creat: 0.97 mg/dL — ABNORMAL HIGH (ref 0.60–0.88)
GFR, Est African American: 61 mL/min/{1.73_m2} (ref >=60)
GFR, Est Non African American: 53 mL/min/{1.73_m2} — ABNORMAL LOW (ref >=60)
Globulin: 2.7 g/dL (calc) (ref 1.9–3.7)
Glucose, Bld: 120 mg/dL — ABNORMAL HIGH (ref 65–99)
Potassium: 5.1 mmol/L (ref 3.5–5.3)
Sodium: 136 mmol/L (ref 135–146)
Total Bilirubin: 0.7 mg/dL (ref 0.2–1.2)
Total Protein: 6.9 g/dL (ref 6.1–8.1)

## 2019-12-29 LAB — CBC WITH DIFFERENTIAL/PLATELET
Absolute Monocytes: 534 cells/uL (ref 200–950)
Basophils Absolute: 29 cells/uL (ref 0–200)
Basophils Relative: 0.5 %
Eosinophils Absolute: 377 cells/uL (ref 15–500)
Eosinophils Relative: 6.5 %
HCT: 33.6 % — ABNORMAL LOW (ref 35.0–45.0)
Hemoglobin: 10.6 g/dL — ABNORMAL LOW (ref 11.7–15.5)
Lymphs Abs: 2175 cells/uL (ref 850–3900)
MCH: 27.7 pg (ref 27.0–33.0)
MCHC: 31.5 g/dL — ABNORMAL LOW (ref 32.0–36.0)
MCV: 88 fL (ref 80.0–100.0)
MPV: 11.1 fL (ref 7.5–12.5)
Monocytes Relative: 9.2 %
Neutro Abs: 2685 cells/uL (ref 1500–7800)
Neutrophils Relative %: 46.3 %
Platelets: 193 10*3/uL (ref 140–400)
RBC: 3.82 10*6/uL (ref 3.80–5.10)
RDW: 13.6 % (ref 11.0–15.0)
Total Lymphocyte: 37.5 %
WBC: 5.8 10*3/uL (ref 3.8–10.8)

## 2019-12-29 LAB — HEMOGLOBIN A1C
Hgb A1c MFr Bld: 6.2 % of total Hgb — ABNORMAL HIGH (ref <5.7)
Mean Plasma Glucose: 131 (calc)
eAG (mmol/L): 7.3 (calc)

## 2019-12-29 LAB — TSH: TSH: 0.88 mIU/L (ref 0.40–4.50)

## 2019-12-29 LAB — LIPID PANEL
Cholesterol: 187 mg/dL (ref <200)
HDL: 100 mg/dL (ref >=50)
LDL Cholesterol (Calc): 72 mg/dL (calc)
Non-HDL Cholesterol (Calc): 87 mg/dL (calc) (ref <130)
Total CHOL/HDL Ratio: 1.9 (calc) (ref <5.0)
Triglycerides: 74 mg/dL (ref <150)

## 2019-12-29 LAB — VITAMIN B12: Vitamin B-12: 620 pg/mL (ref 200–1100)

## 2019-12-31 ENCOUNTER — Other Ambulatory Visit: Payer: Self-pay | Admitting: Neurology

## 2020-01-01 DIAGNOSIS — M25561 Pain in right knee: Secondary | ICD-10-CM | POA: Diagnosis not present

## 2020-01-01 DIAGNOSIS — R41841 Cognitive communication deficit: Secondary | ICD-10-CM | POA: Diagnosis not present

## 2020-01-01 DIAGNOSIS — R488 Other symbolic dysfunctions: Secondary | ICD-10-CM | POA: Diagnosis not present

## 2020-01-01 DIAGNOSIS — M6281 Muscle weakness (generalized): Secondary | ICD-10-CM | POA: Diagnosis not present

## 2020-01-01 DIAGNOSIS — M545 Low back pain: Secondary | ICD-10-CM | POA: Diagnosis not present

## 2020-01-01 DIAGNOSIS — M25562 Pain in left knee: Secondary | ICD-10-CM | POA: Diagnosis not present

## 2020-01-02 DIAGNOSIS — M25561 Pain in right knee: Secondary | ICD-10-CM | POA: Diagnosis not present

## 2020-01-02 DIAGNOSIS — M6281 Muscle weakness (generalized): Secondary | ICD-10-CM | POA: Diagnosis not present

## 2020-01-02 DIAGNOSIS — R41841 Cognitive communication deficit: Secondary | ICD-10-CM | POA: Diagnosis not present

## 2020-01-02 DIAGNOSIS — M25562 Pain in left knee: Secondary | ICD-10-CM | POA: Diagnosis not present

## 2020-01-02 DIAGNOSIS — M545 Low back pain: Secondary | ICD-10-CM | POA: Diagnosis not present

## 2020-01-02 DIAGNOSIS — R488 Other symbolic dysfunctions: Secondary | ICD-10-CM | POA: Diagnosis not present

## 2020-01-03 DIAGNOSIS — M25561 Pain in right knee: Secondary | ICD-10-CM | POA: Diagnosis not present

## 2020-01-03 DIAGNOSIS — R41841 Cognitive communication deficit: Secondary | ICD-10-CM | POA: Diagnosis not present

## 2020-01-03 DIAGNOSIS — M25562 Pain in left knee: Secondary | ICD-10-CM | POA: Diagnosis not present

## 2020-01-03 DIAGNOSIS — M545 Low back pain: Secondary | ICD-10-CM | POA: Diagnosis not present

## 2020-01-03 DIAGNOSIS — M6281 Muscle weakness (generalized): Secondary | ICD-10-CM | POA: Diagnosis not present

## 2020-01-03 DIAGNOSIS — R488 Other symbolic dysfunctions: Secondary | ICD-10-CM | POA: Diagnosis not present

## 2020-01-04 ENCOUNTER — Other Ambulatory Visit: Payer: Self-pay | Admitting: Neurology

## 2020-01-04 DIAGNOSIS — M25562 Pain in left knee: Secondary | ICD-10-CM | POA: Diagnosis not present

## 2020-01-04 DIAGNOSIS — Z23 Encounter for immunization: Secondary | ICD-10-CM | POA: Diagnosis not present

## 2020-01-04 DIAGNOSIS — M25561 Pain in right knee: Secondary | ICD-10-CM | POA: Diagnosis not present

## 2020-01-04 DIAGNOSIS — R488 Other symbolic dysfunctions: Secondary | ICD-10-CM | POA: Diagnosis not present

## 2020-01-04 DIAGNOSIS — M545 Low back pain: Secondary | ICD-10-CM | POA: Diagnosis not present

## 2020-01-04 DIAGNOSIS — M6281 Muscle weakness (generalized): Secondary | ICD-10-CM | POA: Diagnosis not present

## 2020-01-04 DIAGNOSIS — R41841 Cognitive communication deficit: Secondary | ICD-10-CM | POA: Diagnosis not present

## 2020-01-05 DIAGNOSIS — R41841 Cognitive communication deficit: Secondary | ICD-10-CM | POA: Diagnosis not present

## 2020-01-05 DIAGNOSIS — M545 Low back pain: Secondary | ICD-10-CM | POA: Diagnosis not present

## 2020-01-05 DIAGNOSIS — R488 Other symbolic dysfunctions: Secondary | ICD-10-CM | POA: Diagnosis not present

## 2020-01-05 DIAGNOSIS — M25562 Pain in left knee: Secondary | ICD-10-CM | POA: Diagnosis not present

## 2020-01-05 DIAGNOSIS — M6281 Muscle weakness (generalized): Secondary | ICD-10-CM | POA: Diagnosis not present

## 2020-01-05 DIAGNOSIS — M25561 Pain in right knee: Secondary | ICD-10-CM | POA: Diagnosis not present

## 2020-01-06 DIAGNOSIS — M6281 Muscle weakness (generalized): Secondary | ICD-10-CM | POA: Diagnosis not present

## 2020-01-06 DIAGNOSIS — R41841 Cognitive communication deficit: Secondary | ICD-10-CM | POA: Diagnosis not present

## 2020-01-06 DIAGNOSIS — M25562 Pain in left knee: Secondary | ICD-10-CM | POA: Diagnosis not present

## 2020-01-06 DIAGNOSIS — M545 Low back pain: Secondary | ICD-10-CM | POA: Diagnosis not present

## 2020-01-06 DIAGNOSIS — M25561 Pain in right knee: Secondary | ICD-10-CM | POA: Diagnosis not present

## 2020-01-06 DIAGNOSIS — R488 Other symbolic dysfunctions: Secondary | ICD-10-CM | POA: Diagnosis not present

## 2020-01-08 DIAGNOSIS — M25562 Pain in left knee: Secondary | ICD-10-CM | POA: Diagnosis not present

## 2020-01-08 DIAGNOSIS — R41841 Cognitive communication deficit: Secondary | ICD-10-CM | POA: Diagnosis not present

## 2020-01-08 DIAGNOSIS — M545 Low back pain: Secondary | ICD-10-CM | POA: Diagnosis not present

## 2020-01-08 DIAGNOSIS — M6281 Muscle weakness (generalized): Secondary | ICD-10-CM | POA: Diagnosis not present

## 2020-01-08 DIAGNOSIS — R488 Other symbolic dysfunctions: Secondary | ICD-10-CM | POA: Diagnosis not present

## 2020-01-08 DIAGNOSIS — M25561 Pain in right knee: Secondary | ICD-10-CM | POA: Diagnosis not present

## 2020-01-09 DIAGNOSIS — M6281 Muscle weakness (generalized): Secondary | ICD-10-CM | POA: Diagnosis not present

## 2020-01-09 DIAGNOSIS — M545 Low back pain: Secondary | ICD-10-CM | POA: Diagnosis not present

## 2020-01-09 DIAGNOSIS — R488 Other symbolic dysfunctions: Secondary | ICD-10-CM | POA: Diagnosis not present

## 2020-01-09 DIAGNOSIS — M25562 Pain in left knee: Secondary | ICD-10-CM | POA: Diagnosis not present

## 2020-01-09 DIAGNOSIS — R41841 Cognitive communication deficit: Secondary | ICD-10-CM | POA: Diagnosis not present

## 2020-01-09 DIAGNOSIS — M25561 Pain in right knee: Secondary | ICD-10-CM | POA: Diagnosis not present

## 2020-01-10 DIAGNOSIS — M25561 Pain in right knee: Secondary | ICD-10-CM | POA: Diagnosis not present

## 2020-01-10 DIAGNOSIS — M545 Low back pain: Secondary | ICD-10-CM | POA: Diagnosis not present

## 2020-01-10 DIAGNOSIS — M25562 Pain in left knee: Secondary | ICD-10-CM | POA: Diagnosis not present

## 2020-01-10 DIAGNOSIS — R488 Other symbolic dysfunctions: Secondary | ICD-10-CM | POA: Diagnosis not present

## 2020-01-10 DIAGNOSIS — R41841 Cognitive communication deficit: Secondary | ICD-10-CM | POA: Diagnosis not present

## 2020-01-10 DIAGNOSIS — M6281 Muscle weakness (generalized): Secondary | ICD-10-CM | POA: Diagnosis not present

## 2020-01-11 ENCOUNTER — Telehealth: Payer: Self-pay | Admitting: Neurology

## 2020-01-11 DIAGNOSIS — M6281 Muscle weakness (generalized): Secondary | ICD-10-CM | POA: Diagnosis not present

## 2020-01-11 DIAGNOSIS — F03A0 Unspecified dementia, mild, without behavioral disturbance, psychotic disturbance, mood disturbance, and anxiety: Secondary | ICD-10-CM

## 2020-01-11 DIAGNOSIS — M545 Low back pain: Secondary | ICD-10-CM | POA: Diagnosis not present

## 2020-01-11 DIAGNOSIS — M25561 Pain in right knee: Secondary | ICD-10-CM | POA: Diagnosis not present

## 2020-01-11 DIAGNOSIS — R488 Other symbolic dysfunctions: Secondary | ICD-10-CM | POA: Diagnosis not present

## 2020-01-11 DIAGNOSIS — F0390 Unspecified dementia without behavioral disturbance: Secondary | ICD-10-CM

## 2020-01-11 DIAGNOSIS — M25562 Pain in left knee: Secondary | ICD-10-CM | POA: Diagnosis not present

## 2020-01-11 DIAGNOSIS — R41841 Cognitive communication deficit: Secondary | ICD-10-CM | POA: Diagnosis not present

## 2020-01-11 NOTE — Telephone Encounter (Signed)
Patient's daughter [name] called and scheduled an appointment for 08/29/2020 with Dr. Delice Lesch. She has been added to the wait list for a sooner appointment.  In the meantime, the patient is out of buspirone 5 MG and memantine 10 MG and will need refills to last until her next appointment, if possible.  Kristopher Oppenheim on [address] in [place]

## 2020-01-12 DIAGNOSIS — M6281 Muscle weakness (generalized): Secondary | ICD-10-CM | POA: Diagnosis not present

## 2020-01-12 DIAGNOSIS — M25561 Pain in right knee: Secondary | ICD-10-CM | POA: Diagnosis not present

## 2020-01-12 DIAGNOSIS — M25562 Pain in left knee: Secondary | ICD-10-CM | POA: Diagnosis not present

## 2020-01-12 DIAGNOSIS — M545 Low back pain: Secondary | ICD-10-CM | POA: Diagnosis not present

## 2020-01-12 DIAGNOSIS — R488 Other symbolic dysfunctions: Secondary | ICD-10-CM | POA: Diagnosis not present

## 2020-01-12 DIAGNOSIS — R41841 Cognitive communication deficit: Secondary | ICD-10-CM | POA: Diagnosis not present

## 2020-01-12 MED ORDER — BUSPIRONE HCL 5 MG PO TABS
5.0000 mg | ORAL_TABLET | Freq: Two times a day (BID) | ORAL | 11 refills | Status: DC
Start: 2020-01-12 — End: 2020-03-04

## 2020-01-12 MED ORDER — MEMANTINE HCL 10 MG PO TABS: 10.0000 mg | ORAL_TABLET | Freq: Two times a day (BID) | ORAL | 3 refills | Status: DC

## 2020-01-12 NOTE — Telephone Encounter (Signed)
I sent in refills for the Buspirone and Memantine, thanks

## 2020-01-15 DIAGNOSIS — M545 Low back pain: Secondary | ICD-10-CM | POA: Diagnosis not present

## 2020-01-15 DIAGNOSIS — R488 Other symbolic dysfunctions: Secondary | ICD-10-CM | POA: Diagnosis not present

## 2020-01-15 DIAGNOSIS — M25562 Pain in left knee: Secondary | ICD-10-CM | POA: Diagnosis not present

## 2020-01-15 DIAGNOSIS — M25561 Pain in right knee: Secondary | ICD-10-CM | POA: Diagnosis not present

## 2020-01-15 DIAGNOSIS — R41841 Cognitive communication deficit: Secondary | ICD-10-CM | POA: Diagnosis not present

## 2020-01-15 DIAGNOSIS — M6281 Muscle weakness (generalized): Secondary | ICD-10-CM | POA: Diagnosis not present

## 2020-01-16 DIAGNOSIS — R488 Other symbolic dysfunctions: Secondary | ICD-10-CM | POA: Diagnosis not present

## 2020-01-16 DIAGNOSIS — R41841 Cognitive communication deficit: Secondary | ICD-10-CM | POA: Diagnosis not present

## 2020-01-16 DIAGNOSIS — M25561 Pain in right knee: Secondary | ICD-10-CM | POA: Diagnosis not present

## 2020-01-16 DIAGNOSIS — M6281 Muscle weakness (generalized): Secondary | ICD-10-CM | POA: Diagnosis not present

## 2020-01-16 DIAGNOSIS — M25562 Pain in left knee: Secondary | ICD-10-CM | POA: Diagnosis not present

## 2020-01-16 DIAGNOSIS — M545 Low back pain: Secondary | ICD-10-CM | POA: Diagnosis not present

## 2020-01-17 DIAGNOSIS — R488 Other symbolic dysfunctions: Secondary | ICD-10-CM | POA: Diagnosis not present

## 2020-01-17 DIAGNOSIS — M6281 Muscle weakness (generalized): Secondary | ICD-10-CM | POA: Diagnosis not present

## 2020-01-17 DIAGNOSIS — M545 Low back pain: Secondary | ICD-10-CM | POA: Diagnosis not present

## 2020-01-17 DIAGNOSIS — M25561 Pain in right knee: Secondary | ICD-10-CM | POA: Diagnosis not present

## 2020-01-17 DIAGNOSIS — R41841 Cognitive communication deficit: Secondary | ICD-10-CM | POA: Diagnosis not present

## 2020-01-17 DIAGNOSIS — M25562 Pain in left knee: Secondary | ICD-10-CM | POA: Diagnosis not present

## 2020-01-18 ENCOUNTER — Other Ambulatory Visit: Payer: Self-pay | Admitting: Internal Medicine

## 2020-01-18 DIAGNOSIS — M6281 Muscle weakness (generalized): Secondary | ICD-10-CM | POA: Diagnosis not present

## 2020-01-18 DIAGNOSIS — M25562 Pain in left knee: Secondary | ICD-10-CM | POA: Diagnosis not present

## 2020-01-18 DIAGNOSIS — R488 Other symbolic dysfunctions: Secondary | ICD-10-CM | POA: Diagnosis not present

## 2020-01-18 DIAGNOSIS — M545 Low back pain: Secondary | ICD-10-CM | POA: Diagnosis not present

## 2020-01-18 DIAGNOSIS — R41841 Cognitive communication deficit: Secondary | ICD-10-CM | POA: Diagnosis not present

## 2020-01-18 DIAGNOSIS — M25561 Pain in right knee: Secondary | ICD-10-CM | POA: Diagnosis not present

## 2020-01-19 DIAGNOSIS — R488 Other symbolic dysfunctions: Secondary | ICD-10-CM | POA: Diagnosis not present

## 2020-01-19 DIAGNOSIS — M25561 Pain in right knee: Secondary | ICD-10-CM | POA: Diagnosis not present

## 2020-01-19 DIAGNOSIS — M25562 Pain in left knee: Secondary | ICD-10-CM | POA: Diagnosis not present

## 2020-01-19 DIAGNOSIS — R41841 Cognitive communication deficit: Secondary | ICD-10-CM | POA: Diagnosis not present

## 2020-01-19 DIAGNOSIS — M5459 Other low back pain: Secondary | ICD-10-CM | POA: Diagnosis not present

## 2020-01-19 DIAGNOSIS — M6281 Muscle weakness (generalized): Secondary | ICD-10-CM | POA: Diagnosis not present

## 2020-01-22 DIAGNOSIS — M5459 Other low back pain: Secondary | ICD-10-CM | POA: Diagnosis not present

## 2020-01-22 DIAGNOSIS — R488 Other symbolic dysfunctions: Secondary | ICD-10-CM | POA: Diagnosis not present

## 2020-01-22 DIAGNOSIS — R41841 Cognitive communication deficit: Secondary | ICD-10-CM | POA: Diagnosis not present

## 2020-01-22 DIAGNOSIS — M25562 Pain in left knee: Secondary | ICD-10-CM | POA: Diagnosis not present

## 2020-01-22 DIAGNOSIS — M25561 Pain in right knee: Secondary | ICD-10-CM | POA: Diagnosis not present

## 2020-01-22 DIAGNOSIS — M6281 Muscle weakness (generalized): Secondary | ICD-10-CM | POA: Diagnosis not present

## 2020-01-23 DIAGNOSIS — R488 Other symbolic dysfunctions: Secondary | ICD-10-CM | POA: Diagnosis not present

## 2020-01-23 DIAGNOSIS — M6281 Muscle weakness (generalized): Secondary | ICD-10-CM | POA: Diagnosis not present

## 2020-01-23 DIAGNOSIS — R41841 Cognitive communication deficit: Secondary | ICD-10-CM | POA: Diagnosis not present

## 2020-01-23 DIAGNOSIS — M25561 Pain in right knee: Secondary | ICD-10-CM | POA: Diagnosis not present

## 2020-01-23 DIAGNOSIS — M25562 Pain in left knee: Secondary | ICD-10-CM | POA: Diagnosis not present

## 2020-01-23 DIAGNOSIS — M5459 Other low back pain: Secondary | ICD-10-CM | POA: Diagnosis not present

## 2020-01-24 DIAGNOSIS — R41841 Cognitive communication deficit: Secondary | ICD-10-CM | POA: Diagnosis not present

## 2020-01-24 DIAGNOSIS — M6281 Muscle weakness (generalized): Secondary | ICD-10-CM | POA: Diagnosis not present

## 2020-01-24 DIAGNOSIS — M25562 Pain in left knee: Secondary | ICD-10-CM | POA: Diagnosis not present

## 2020-01-24 DIAGNOSIS — M25561 Pain in right knee: Secondary | ICD-10-CM | POA: Diagnosis not present

## 2020-01-24 DIAGNOSIS — R488 Other symbolic dysfunctions: Secondary | ICD-10-CM | POA: Diagnosis not present

## 2020-01-24 DIAGNOSIS — M5459 Other low back pain: Secondary | ICD-10-CM | POA: Diagnosis not present

## 2020-01-25 DIAGNOSIS — R488 Other symbolic dysfunctions: Secondary | ICD-10-CM | POA: Diagnosis not present

## 2020-01-25 DIAGNOSIS — M25562 Pain in left knee: Secondary | ICD-10-CM | POA: Diagnosis not present

## 2020-01-25 DIAGNOSIS — R41841 Cognitive communication deficit: Secondary | ICD-10-CM | POA: Diagnosis not present

## 2020-01-25 DIAGNOSIS — M25561 Pain in right knee: Secondary | ICD-10-CM | POA: Diagnosis not present

## 2020-01-25 DIAGNOSIS — M5459 Other low back pain: Secondary | ICD-10-CM | POA: Diagnosis not present

## 2020-01-25 DIAGNOSIS — M6281 Muscle weakness (generalized): Secondary | ICD-10-CM | POA: Diagnosis not present

## 2020-01-26 DIAGNOSIS — R41841 Cognitive communication deficit: Secondary | ICD-10-CM | POA: Diagnosis not present

## 2020-01-26 DIAGNOSIS — M6281 Muscle weakness (generalized): Secondary | ICD-10-CM | POA: Diagnosis not present

## 2020-01-26 DIAGNOSIS — M5459 Other low back pain: Secondary | ICD-10-CM | POA: Diagnosis not present

## 2020-01-26 DIAGNOSIS — M25562 Pain in left knee: Secondary | ICD-10-CM | POA: Diagnosis not present

## 2020-01-26 DIAGNOSIS — M25561 Pain in right knee: Secondary | ICD-10-CM | POA: Diagnosis not present

## 2020-01-26 DIAGNOSIS — R488 Other symbolic dysfunctions: Secondary | ICD-10-CM | POA: Diagnosis not present

## 2020-01-29 DIAGNOSIS — R488 Other symbolic dysfunctions: Secondary | ICD-10-CM | POA: Diagnosis not present

## 2020-01-29 DIAGNOSIS — M5459 Other low back pain: Secondary | ICD-10-CM | POA: Diagnosis not present

## 2020-01-29 DIAGNOSIS — M6281 Muscle weakness (generalized): Secondary | ICD-10-CM | POA: Diagnosis not present

## 2020-01-29 DIAGNOSIS — M25562 Pain in left knee: Secondary | ICD-10-CM | POA: Diagnosis not present

## 2020-01-29 DIAGNOSIS — R41841 Cognitive communication deficit: Secondary | ICD-10-CM | POA: Diagnosis not present

## 2020-01-29 DIAGNOSIS — M25561 Pain in right knee: Secondary | ICD-10-CM | POA: Diagnosis not present

## 2020-01-30 DIAGNOSIS — M25562 Pain in left knee: Secondary | ICD-10-CM | POA: Diagnosis not present

## 2020-01-30 DIAGNOSIS — M6281 Muscle weakness (generalized): Secondary | ICD-10-CM | POA: Diagnosis not present

## 2020-01-30 DIAGNOSIS — R41841 Cognitive communication deficit: Secondary | ICD-10-CM | POA: Diagnosis not present

## 2020-01-30 DIAGNOSIS — M5459 Other low back pain: Secondary | ICD-10-CM | POA: Diagnosis not present

## 2020-01-30 DIAGNOSIS — R488 Other symbolic dysfunctions: Secondary | ICD-10-CM | POA: Diagnosis not present

## 2020-01-30 DIAGNOSIS — M25561 Pain in right knee: Secondary | ICD-10-CM | POA: Diagnosis not present

## 2020-01-31 DIAGNOSIS — M25562 Pain in left knee: Secondary | ICD-10-CM | POA: Diagnosis not present

## 2020-01-31 DIAGNOSIS — M25561 Pain in right knee: Secondary | ICD-10-CM | POA: Diagnosis not present

## 2020-01-31 DIAGNOSIS — R488 Other symbolic dysfunctions: Secondary | ICD-10-CM | POA: Diagnosis not present

## 2020-01-31 DIAGNOSIS — R41841 Cognitive communication deficit: Secondary | ICD-10-CM | POA: Diagnosis not present

## 2020-01-31 DIAGNOSIS — M5459 Other low back pain: Secondary | ICD-10-CM | POA: Diagnosis not present

## 2020-01-31 DIAGNOSIS — M6281 Muscle weakness (generalized): Secondary | ICD-10-CM | POA: Diagnosis not present

## 2020-02-01 DIAGNOSIS — M25561 Pain in right knee: Secondary | ICD-10-CM | POA: Diagnosis not present

## 2020-02-01 DIAGNOSIS — M25562 Pain in left knee: Secondary | ICD-10-CM | POA: Diagnosis not present

## 2020-02-01 DIAGNOSIS — M6281 Muscle weakness (generalized): Secondary | ICD-10-CM | POA: Diagnosis not present

## 2020-02-01 DIAGNOSIS — R41841 Cognitive communication deficit: Secondary | ICD-10-CM | POA: Diagnosis not present

## 2020-02-01 DIAGNOSIS — M5459 Other low back pain: Secondary | ICD-10-CM | POA: Diagnosis not present

## 2020-02-01 DIAGNOSIS — R488 Other symbolic dysfunctions: Secondary | ICD-10-CM | POA: Diagnosis not present

## 2020-02-02 DIAGNOSIS — M25562 Pain in left knee: Secondary | ICD-10-CM | POA: Diagnosis not present

## 2020-02-02 DIAGNOSIS — M6281 Muscle weakness (generalized): Secondary | ICD-10-CM | POA: Diagnosis not present

## 2020-02-02 DIAGNOSIS — R41841 Cognitive communication deficit: Secondary | ICD-10-CM | POA: Diagnosis not present

## 2020-02-02 DIAGNOSIS — R488 Other symbolic dysfunctions: Secondary | ICD-10-CM | POA: Diagnosis not present

## 2020-02-02 DIAGNOSIS — M25561 Pain in right knee: Secondary | ICD-10-CM | POA: Diagnosis not present

## 2020-02-02 DIAGNOSIS — M5459 Other low back pain: Secondary | ICD-10-CM | POA: Diagnosis not present

## 2020-02-05 ENCOUNTER — Telehealth: Payer: Self-pay | Admitting: Internal Medicine

## 2020-02-05 DIAGNOSIS — M25562 Pain in left knee: Secondary | ICD-10-CM | POA: Diagnosis not present

## 2020-02-05 DIAGNOSIS — M5459 Other low back pain: Secondary | ICD-10-CM | POA: Diagnosis not present

## 2020-02-05 DIAGNOSIS — M6281 Muscle weakness (generalized): Secondary | ICD-10-CM | POA: Diagnosis not present

## 2020-02-05 DIAGNOSIS — R488 Other symbolic dysfunctions: Secondary | ICD-10-CM | POA: Diagnosis not present

## 2020-02-05 DIAGNOSIS — M25561 Pain in right knee: Secondary | ICD-10-CM | POA: Diagnosis not present

## 2020-02-05 DIAGNOSIS — R41841 Cognitive communication deficit: Secondary | ICD-10-CM | POA: Diagnosis not present

## 2020-02-05 NOTE — Telephone Encounter (Signed)
Noted.  She does have an appointment with me in a few days.

## 2020-02-05 NOTE — Telephone Encounter (Signed)
[facility] Report/Call: ---Caller states she is having back pain that is in the lower back that started 2 days ago. Pain is 10. Has not taken any medicine. No urinary symptoms. No fever. Painful with walking and sitting Down  Advised go to ED now. (I do not see patient went to ED.)

## 2020-02-06 DIAGNOSIS — M5459 Other low back pain: Secondary | ICD-10-CM | POA: Diagnosis not present

## 2020-02-06 DIAGNOSIS — R41841 Cognitive communication deficit: Secondary | ICD-10-CM | POA: Diagnosis not present

## 2020-02-06 DIAGNOSIS — R488 Other symbolic dysfunctions: Secondary | ICD-10-CM | POA: Diagnosis not present

## 2020-02-06 DIAGNOSIS — M6281 Muscle weakness (generalized): Secondary | ICD-10-CM | POA: Diagnosis not present

## 2020-02-06 DIAGNOSIS — M25561 Pain in right knee: Secondary | ICD-10-CM | POA: Diagnosis not present

## 2020-02-06 DIAGNOSIS — M25562 Pain in left knee: Secondary | ICD-10-CM | POA: Diagnosis not present

## 2020-02-07 DIAGNOSIS — M25562 Pain in left knee: Secondary | ICD-10-CM | POA: Diagnosis not present

## 2020-02-07 DIAGNOSIS — R41841 Cognitive communication deficit: Secondary | ICD-10-CM | POA: Diagnosis not present

## 2020-02-07 DIAGNOSIS — M25561 Pain in right knee: Secondary | ICD-10-CM | POA: Diagnosis not present

## 2020-02-07 DIAGNOSIS — M6281 Muscle weakness (generalized): Secondary | ICD-10-CM | POA: Diagnosis not present

## 2020-02-07 DIAGNOSIS — M5459 Other low back pain: Secondary | ICD-10-CM | POA: Diagnosis not present

## 2020-02-07 DIAGNOSIS — R488 Other symbolic dysfunctions: Secondary | ICD-10-CM | POA: Diagnosis not present

## 2020-02-07 NOTE — Progress Notes (Signed)
Subjective:    Patient ID: [name], female    DOB: 11/14/1930, 86 y.o.   MRN: [id number]  HPI The patient is here for follow up of their chronic medical problems, including htn, hyperlipidemia, DM, gerd, depression, dementia, chronic back pain  She lives at the [facility].  Her daughter is here with her.    Working with [facility] and still working with PT.  [facility]  She has been physically active and socially active.   Has pills in medidose pack.  There has been no errors since then.   She continues to have chronic back pain that flares occasionally.  Typically her daughter can advise her which she needs to do to help calm that pain down.  Medications and allergies reviewed with patient and updated if appropriate.  Patient Active Problem List   Diagnosis Date Noted  . Mild dementia ([place]) 07/27/2017  . Chronic RUQ pain 05/28/2016  . Right knee pain 06/14/2015  . Diabetic neuropathy ([place]) 03/21/2014  . Bunion 03/21/2014  . PVD (pulmonary valve disease) 01/23/2014  . Onychomycosis 12/20/2013  . PVD (peripheral vascular disease) ([place]) 12/19/2013  . Gout 12/13/2013  . Depression   . Spinal stenosis of lumbar region 04/24/2013  . Arthritis of shoulder region, left 09/11/2011  . Type II diabetes mellitus with neurological manifestations ([place]) 01/17/2009  . Anemia 10/15/2008  . CAD, NATIVE VESSEL 06/09/2008  . Hyperlipidemia 06/15/2007  . Essential hypertension 06/15/2007  . GERD 06/15/2007  . Fibromyalgia syndrome 06/15/2007  . 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 DIVERTICULOSIS, 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    Current Outpatient Medications on File Prior to Visit  Medication Sig Dispense Refill  . acetaminophen (TYLENOL) 500 MG tablet Take 1,000 mg by mouth 2 (two) times daily.     Marland Kitchen amLODipine (NORVASC) 5 MG tablet Take 1 tablet (5 mg total) by mouth 2 (two) times daily. 180 tablet 1  . aspirin EC 81 MG tablet Take 81 mg by mouth daily.    . Biotin 1 MG  CAPS Take 1 capsule by mouth daily.    . busPIRone (BUSPAR) 5 MG tablet Take 1 tablet (5 mg total) by mouth 2 (two) times daily. 60 tablet 11  . cetirizine (ZYRTEC) 10 MG tablet TAKE ONE TABLET BY MOUTH DAILY (Patient taking differently: Take 10 mg by mouth at bedtime. ) 90 tablet 3  . Cholecalciferol (D3-1000) 25 MCG (1000 UT) capsule Take 1,000 Units by mouth daily.    . diclofenac sodium (VOLTAREN) 1 % GEL Apply 2 g topically 4 (four) times daily as needed (pain). 100 g 5  . donepezil (ARICEPT) 10 MG tablet Take 1 tablet daily 90 tablet 3  . DULoxetine (CYMBALTA) 60 MG capsule TAKE ONE CAPSULE BY MOUTH DAILY 90 capsule 2  . gabapentin (NEURONTIN) 300 MG capsule TAKE ONE CAPSULE BY MOUTH EVERY NIGHT AT BEDTIME 90 capsule 3  . meloxicam (MOBIC) 7.5 MG tablet TAKE ONE TABLET BY MOUTH DAILY 90 tablet 1  . memantine (NAMENDA) 10 MG tablet Take 1 tablet (10 mg total) by mouth 2 (two) times daily. 180 tablet 3  . omeprazole (PRILOSEC) 40 MG capsule TAKE 1 CAPSULE(S) BY MOUTH DAILY 90 capsule 1  . ramipril (ALTACE) 10 MG capsule Take 1 capsule (10 mg total) by mouth 2 (two) times daily. 180 capsule 1   No current facility-administered medications on file prior to visit.    Past Medical History:  Diagnosis Date  . CAD, NATIVE VESSEL 06/09/2008  .  DIABETES MELLITUS, TYPE II   . FIBROMYALGIA   . GERD   . Gout   . HIATAL HERNIA   . HYPERLIPIDEMIA   . HYPERTENSION   . Insomnia   . OA (osteoarthritis)    severe, shoulders, hands - inflammatory, ?RA  . S/P CABG x 1   . S/P inguinal hernia repair   . TUBULOVILLOUS ADENOMA, 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   colo 04/2012 - no polyps - no further screening planned (age >62)  . UNSPECIFIED ANEMIA     Past Surgical History:  Procedure Laterality Date  . ABDOMINAL HYSTERECTOMY  1981  . APPENDECTOMY    . CATARACT EXTRACTION, BILATERAL    . CORONARY ARTERY BYPASS GRAFT  2010  . HERNIA REPAIR     INGUINAL  . I & D EXTREMITY Left 12/07/2017   Procedure:  IRRIGATION AND DEBRIDEMENT EXTREMITY WITH FOREIGN BODY REMOVAL;  Surgeon: Nicholes Stairs, MD;  Location: [facility];  Service: Orthopedics;  Laterality: Left;  . JOINT REPLACEMENT    . OVARIAN CYST REMOVAL    . REPLACEMENT TOTAL KNEE BILATERAL Bilateral    Rt-1974, Lt -1989  . REVERSE SHOULDER ARTHROPLASTY  09/08/2011   Procedure: REVERSE SHOULDER ARTHROPLASTY;  Surgeon: Nita Sells, MD;  Location: [facility];  Service: Orthopedics;  Laterality: Left;  left reverse total shoulder arthroplasty  . ROTATOR CUFF REPAIR     Left    Social History   Socioeconomic History  . Marital status: Widowed    Spouse name: Not on file  . Number of children: Not on file  . Years of education: Not on file  . Highest education level: Not on file  Occupational History  . Not on file  Tobacco Use  . Smoking status: Former Smoker    Quit date: 04/26/1970    Years since quitting: 49.8  . Smokeless tobacco: Never Used  Vaping Use  . Vaping Use: Never used  Substance and Sexual Activity  . Alcohol use: Yes    Alcohol/week: 1.0 standard drink    Types: 1 Glasses of wine per week  . Drug use: No  . Sexual activity: Not on file  Other Topics Concern  . Not on file  Social History Narrative   Widows, lives alone. 2 sons. 2 daughters. Retired Ambulance person   Social Determinants of Radio broadcast assistant Strain:   . Difficulty of Paying Living Expenses: Not on file  Food Insecurity:   . Worried About Charity fundraiser in the Last Year: Not on file  . Ran Out of Food in the Last Year: Not on file  Transportation Needs:   . Lack of Transportation (Medical): Not on file  . Lack of Transportation (Non-Medical): Not on file  Physical Activity:   . Days of Exercise per Week: Not on file  . Minutes of Exercise per Session: Not on file  Stress:   . Feeling of Stress : Not on file  Social Connections:   . Frequency of Communication with Friends and Family: Not on file  . Frequency of  Social Gatherings with Friends and Family: Not on file  . Attends Religious Services: Not on file  . Active Member of Clubs or Organizations: Not on file  . Attends Archivist Meetings: Not on file  . Marital Status: Not on file    Family History  Problem Relation Age of Onset  . Cancer Mother        Stomach  . Stomach cancer Mother 66  .  Hypertension Mother   . Heart disease Father 54       MI  . Diabetes Father   . Heart attack Father   . Diabetes Daughter   . Hypertension Daughter   . Vascular Disease Sister   . Stroke Sister   . Dementia Sister   . Diabetes Sister   . Parkinsonism Sister   . Stroke Sister   . Heart disease Sister   . Heart attack Brother   . Anesthesia problems Neg Hx   . Colon cancer Neg Hx     Review of Systems  Constitutional: Negative for appetite change and fever.  Respiratory: Negative for cough and shortness of breath.   Cardiovascular: Positive for leg swelling (mild, occ). Negative for chest pain and palpitations.  Gastrointestinal:       Jerrye Bushy controlled  Musculoskeletal: Positive for back pain.  Neurological: Negative for light-headedness and headaches.  Psychiatric/Behavioral: Negative for sleep disturbance.       Objective:   Vitals:   02/08/20 1136  BP: 136/70  Pulse: 63  Temp: 98 F (36.7 C)  SpO2: 97%   BP Readings from Last 3 Encounters:  02/08/20 136/70  02/08/20 136/70  12/28/19 130/86   Wt Readings from Last 3 Encounters:  02/08/20 153 lb (69.4 kg)  02/08/20 153 lb (69.4 kg)  12/28/19 154 lb 9.6 oz (70.1 kg)   Body mass index is 30.9 kg/m.   Physical Exam    Constitutional: Appears well-developed and well-nourished. No distress.  HENT:  Head: Normocephalic and atraumatic.  Neck: Neck supple. No tracheal deviation present. No thyromegaly present.  No cervical lymphadenopathy Cardiovascular: Normal rate, regular rhythm and normal heart sounds.   No murmur heard. No carotid bruit .  No  edema Pulmonary/Chest: Effort normal and breath sounds normal. No respiratory distress. No has no wheezes. No rales.  Skin: Skin is warm and dry. Not diaphoretic.  Psychiatric: Normal mood and affect. Behavior is normal.      Assessment & Plan:    See Problem List for Assessment and Plan of chronic medical problems.    This visit occurred during the SARS-CoV-2 public health emergency.  Safety protocols were in place, including screening questions prior to the visit, additional usage of staff PPE, and extensive cleaning of exam room while observing appropriate contact time as indicated for disinfecting solutions.

## 2020-02-07 NOTE — Patient Instructions (Addendum)
  Medications reviewed and updated.  Changes include :   none    Please followup in 6 months   

## 2020-02-08 ENCOUNTER — Encounter: Payer: Self-pay | Admitting: Physician Assistant

## 2020-02-08 ENCOUNTER — Telehealth (INDEPENDENT_AMBULATORY_CARE_PROVIDER_SITE_OTHER): Payer: Medicare Other | Admitting: Physician Assistant

## 2020-02-08 ENCOUNTER — Ambulatory Visit (INDEPENDENT_AMBULATORY_CARE_PROVIDER_SITE_OTHER): Payer: Medicare Other | Admitting: Internal Medicine

## 2020-02-08 ENCOUNTER — Encounter: Payer: Self-pay | Admitting: Internal Medicine

## 2020-02-08 ENCOUNTER — Other Ambulatory Visit: Payer: Self-pay

## 2020-02-08 VITALS — BP 136/70 | HR 63 | Temp 98.0°F | Ht 59.0 in | Wt 153.0 lb

## 2020-02-08 VITALS — BP 136/70 | HR 63 | Ht 59.0 in | Wt 153.0 lb

## 2020-02-08 DIAGNOSIS — M48061 Spinal stenosis, lumbar region without neurogenic claudication: Secondary | ICD-10-CM | POA: Diagnosis not present

## 2020-02-08 DIAGNOSIS — E782 Mixed hyperlipidemia: Secondary | ICD-10-CM | POA: Diagnosis not present

## 2020-02-08 DIAGNOSIS — I10 Essential (primary) hypertension: Secondary | ICD-10-CM

## 2020-02-08 DIAGNOSIS — D509 Iron deficiency anemia, unspecified: Secondary | ICD-10-CM | POA: Diagnosis not present

## 2020-02-08 DIAGNOSIS — M5459 Other low back pain: Secondary | ICD-10-CM | POA: Diagnosis not present

## 2020-02-08 DIAGNOSIS — M25561 Pain in right knee: Secondary | ICD-10-CM | POA: Diagnosis not present

## 2020-02-08 DIAGNOSIS — I25810 Atherosclerosis of coronary artery bypass graft(s) without angina pectoris: Secondary | ICD-10-CM

## 2020-02-08 DIAGNOSIS — R488 Other symbolic dysfunctions: Secondary | ICD-10-CM | POA: Diagnosis not present

## 2020-02-08 DIAGNOSIS — F3289 Other specified depressive episodes: Secondary | ICD-10-CM | POA: Diagnosis not present

## 2020-02-08 DIAGNOSIS — E1149 Type 2 diabetes mellitus with other diabetic neurological complication: Secondary | ICD-10-CM | POA: Diagnosis not present

## 2020-02-08 DIAGNOSIS — F03A0 Unspecified dementia, mild, without behavioral disturbance, psychotic disturbance, mood disturbance, and anxiety: Secondary | ICD-10-CM

## 2020-02-08 DIAGNOSIS — M25562 Pain in left knee: Secondary | ICD-10-CM | POA: Diagnosis not present

## 2020-02-08 DIAGNOSIS — R41841 Cognitive communication deficit: Secondary | ICD-10-CM | POA: Diagnosis not present

## 2020-02-08 DIAGNOSIS — M6281 Muscle weakness (generalized): Secondary | ICD-10-CM | POA: Diagnosis not present

## 2020-02-08 DIAGNOSIS — I2510 Atherosclerotic heart disease of native coronary artery without angina pectoris: Secondary | ICD-10-CM | POA: Diagnosis not present

## 2020-02-08 DIAGNOSIS — K219 Gastro-esophageal reflux disease without esophagitis: Secondary | ICD-10-CM

## 2020-02-08 DIAGNOSIS — F0390 Unspecified dementia without behavioral disturbance: Secondary | ICD-10-CM | POA: Diagnosis not present

## 2020-02-08 MED ORDER — CARVEDILOL 6.25 MG PO TABS: ORAL_TABLET | ORAL | 3 refills | Status: DC

## 2020-02-08 MED ORDER — ATORVASTATIN CALCIUM 40 MG PO TABS: 40.0000 mg | ORAL_TABLET | ORAL | 3 refills | Status: DC

## 2020-02-08 NOTE — Assessment & Plan Note (Signed)
Chronic GERD controlled Continue omeprazole 40 mg daily Denies abdominal pain and nausea

## 2020-02-08 NOTE — Assessment & Plan Note (Signed)
Chronic With chronic back pain Back pain flares at times, but is tolerable Continue meloxicam 7.5 mg daily and Voltaren gel

## 2020-02-08 NOTE — Assessment & Plan Note (Signed)
Chronic Following with Dr. Delice Lesch On Aricept and Namenda-continue On BuSpar twice daily, which does help

## 2020-02-08 NOTE — Progress Notes (Signed)
Virtual Visit via Video Note   This visit type was conducted due to national recommendations for restrictions regarding the COVID-19 Pandemic (e.g. social distancing) in an effort to limit this patient's exposure and mitigate transmission in our community.  Due to her co-morbid illnesses, this patient is at least at moderate risk for complications without adequate follow up.  This format is felt to be most appropriate for this patient at this time.  All issues noted in this document were discussed and addressed.  A limited physical exam was performed with this format.  Please refer to the patient's chart for her consent to telehealth for [facility].       Date:  02/08/2020   ID:  [name], DOB Apr 16, 1931, MRN [id number] The patient was identified using 2 identifiers.  Patient Location: Home Provider Location: Home Office  PCP:  Binnie Rail, MD  Cardiologist:  Dorris Carnes, MD  Electrophysiologist:  None   Evaluation Performed:  Follow-Up Visit  Chief Complaint:  18 months follow up  History of Present Illness:    [name] is a 86 y.o. female with with history of CAD s/p CABG in 2010, hyperlipidemia, hypertension and fibromyalgia seen for longer to follow-up.  Last Myoview November 2018 was negative for ischemia.  She was doing well on cardiac standpoint when last seen by Dr. Harrington Challenger virtually May 2020.  Patient is seen virtually for longer to follow-up with help of daughter who is physician.  Patient had complaint of muscle ache with some memory issue and daughter has reduced her Lipitor to 40 mg every other day about a month ago.  Patient currently lives at [facility].  Does physical therapy without any limitation.  No chest pain, shortness of breath, orthopnea, PND, syncope, lower extremity edema or melena.  The patient does not have symptoms concerning for COVID-19 infection (fever, chills, cough, or new shortness of breath).    Past Medical  History:  Diagnosis Date  . CAD, NATIVE VESSEL 06/09/2008  . DIABETES MELLITUS, TYPE II   . FIBROMYALGIA   . GERD   . Gout   . HIATAL HERNIA   . HYPERLIPIDEMIA   . HYPERTENSION   . Insomnia   . OA (osteoarthritis)    severe, shoulders, hands - inflammatory, ?RA  . S/P CABG x 1   . S/P inguinal hernia repair   . TUBULOVILLOUS ADENOMA, 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   colo 04/2012 - no polyps - no further screening planned (age >20)  . UNSPECIFIED ANEMIA    Past Surgical History:  Procedure Laterality Date  . ABDOMINAL HYSTERECTOMY  1981  . APPENDECTOMY    . CATARACT EXTRACTION, BILATERAL    . CORONARY ARTERY BYPASS GRAFT  2010  . HERNIA REPAIR     INGUINAL  . I & D EXTREMITY Left 12/07/2017   Procedure: IRRIGATION AND DEBRIDEMENT EXTREMITY WITH FOREIGN BODY REMOVAL;  Surgeon: Nicholes Stairs, MD;  Location: [place];  Service: Orthopedics;  Laterality: Left;  . JOINT REPLACEMENT    . OVARIAN CYST REMOVAL    . REPLACEMENT TOTAL KNEE BILATERAL Bilateral    Rt-1974, Lt -1989  . REVERSE SHOULDER ARTHROPLASTY  09/08/2011   Procedure: REVERSE SHOULDER ARTHROPLASTY;  Surgeon: Nita Sells, MD;  Location: [place];  Service: Orthopedics;  Laterality: Left;  left reverse total shoulder arthroplasty  . ROTATOR CUFF REPAIR     Left     Current Meds  Medication Sig  . acetaminophen (TYLENOL) 500 MG tablet Take  1,000 mg by mouth 2 (two) times daily.   Marland Kitchen amLODipine (NORVASC) 5 MG tablet Take 1 tablet (5 mg total) by mouth 2 (two) times daily.  Marland Kitchen aspirin EC 81 MG tablet Take 81 mg by mouth daily.  Marland Kitchen atorvastatin (LIPITOR) 40 MG tablet Take 1 tablet (40 mg total) by mouth every other day.  . Biotin 1 MG CAPS Take 1 capsule by mouth daily.  . busPIRone (BUSPAR) 5 MG tablet Take 1 tablet (5 mg total) by mouth 2 (two) times daily.  . carvedilol (COREG) 6.25 MG tablet TAKE ONE TABLET BY MOUTH TWICE A DAY  . cetirizine (ZYRTEC) 10 MG tablet TAKE ONE TABLET BY MOUTH DAILY (Patient taking  differently: Take 10 mg by mouth at bedtime. )  . Cholecalciferol (D3-1000) 25 MCG (1000 UT) capsule Take 1,000 Units by mouth daily.  . diclofenac sodium (VOLTAREN) 1 % GEL Apply 2 g topically 4 (four) times daily as needed (pain).  Marland Kitchen donepezil (ARICEPT) 10 MG tablet Take 1 tablet daily  . DULoxetine (CYMBALTA) 60 MG capsule TAKE ONE CAPSULE BY MOUTH DAILY  . gabapentin (NEURONTIN) 300 MG capsule TAKE ONE CAPSULE BY MOUTH EVERY NIGHT AT BEDTIME  . meloxicam (MOBIC) 7.5 MG tablet TAKE ONE TABLET BY MOUTH DAILY  . memantine (NAMENDA) 10 MG tablet Take 1 tablet (10 mg total) by mouth 2 (two) times daily.  Marland Kitchen omeprazole (PRILOSEC) 40 MG capsule TAKE 1 CAPSULE(S) BY MOUTH DAILY  . ramipril (ALTACE) 10 MG capsule Take 1 capsule (10 mg total) by mouth 2 (two) times daily.  . [DISCONTINUED] atorvastatin (LIPITOR) 40 MG tablet TAKE ONE TABLET BY MOUTH DAILY (Patient taking differently: Take 40 mg by mouth every other day. )  . [DISCONTINUED] carvedilol (COREG) 6.25 MG tablet TAKE ONE TABLET BY MOUTH TWICE A DAY *NEED APPOINTMENT WITH DR. ROSS FOR REFILLS     Allergies:   Patient has no known allergies.   Social History   Tobacco Use  . Smoking status: Former Smoker    Quit date: 04/26/1970    Years since quitting: 49.8  . Smokeless tobacco: Never Used  Vaping Use  . Vaping Use: Never used  Substance Use Topics  . Alcohol use: Yes    Alcohol/week: 1.0 standard drink    Types: 1 Glasses of wine per week  . Drug use: No     Family Hx: The patient's family history includes Cancer in her mother; Dementia in her sister; Diabetes in her daughter, father, and sister; Heart attack in her brother and father; Heart disease in her sister; Heart disease (age of onset: 12) in her father; Hypertension in her daughter and mother; Parkinsonism in her sister; Stomach cancer (age of onset: 27) in her mother; Stroke in her sister and sister; Vascular Disease in her sister. There is no history of Anesthesia  problems or Colon cancer.  ROS:   Please see the history of present illness.    All other systems reviewed and are negative.   Prior CV studies:   The following studies were reviewed today:  Stress test 02/2017  Nuclear stress EF: 59%.  Probable normal perfusion and soft tissue attenuation (diaphragm, bowel activity) No ischemia or significant scar  This is a low risk study.   Labs/Other Tests and Data Reviewed:    EKG:  No ECG reviewed.  Recent Labs: 12/28/2019: ALT 13; BUN 21; Creat 0.97; Hemoglobin 10.6; Platelets 193; Potassium 5.1; Sodium 136; TSH 0.88   Recent Lipid Panel Lab Results  Component  Value Date/Time   CHOL 187 12/28/2019 10:42 AM   TRIG 74 12/28/2019 10:42 AM   HDL 100 12/28/2019 10:42 AM   CHOLHDL 1.9 12/28/2019 10:42 AM   LDLCALC 72 12/28/2019 10:42 AM   LDLDIRECT 115.3 09/30/2009 10:59 AM    Wt Readings from Last 3 Encounters:  02/08/20 153 lb (69.4 kg)  02/08/20 153 lb (69.4 kg)  12/28/19 154 lb 9.6 oz (70.1 kg)     Objective:    Vital Signs:  BP 136/70   Pulse 63   Ht 4\' 11"  (1.499 m)   Wt 153 lb (69.4 kg)   BMI 30.90 kg/m    VITAL SIGNS:  reviewed GEN:  no acute distress EYES:  sclerae anicteric, EOMI - Extraocular Movements Intact RESPIRATORY:  normal respiratory effort, symmetric expansion CARDIOVASCULAR:  no peripheral edema SKIN:  no rash, lesions or ulcers. MUSCULOSKELETAL:  no obvious deformities. NEURO:  alert and oriented x 3, no obvious focal deficit PSYCH:  normal affect  ASSESSMENT & PLAN:    1. CAD s/p CABG in 2010 -No angina.  Continue current medical therapy.  2. HTN -Stable and well-controlled on current medications.  3. HLD -08/03/2019: VLDL 15.4 12/28/2019: Cholesterol 187; HDL 100; LDL Cholesterol (Calc) 72; Triglycerides 74  -Continue Lipitor 40 mg every other day.  Daughter may try to lower down further if any issue.  COVID-19 Education: The signs and symptoms of COVID-19 were discussed with the patient  and how to seek care for testing (follow up with PCP or arrange E-visit).  The importance of social distancing was discussed today.  Time:   Today, I have spent 10  minutes with the patient with telehealth technology discussing the above problems.     Medication Adjustments/Labs and Tests Ordered: Current medicines are reviewed at length with the patient today.  Concerns regarding medicines are outlined above.   Tests Ordered: No orders of the defined types were placed in this encounter.   Medication Changes: Meds ordered this encounter  Medications  . atorvastatin (LIPITOR) 40 MG tablet    Sig: Take 1 tablet (40 mg total) by mouth every other day.    Dispense:  140 tablet    Refill:  3  . carvedilol (COREG) 6.25 MG tablet    Sig: TAKE ONE TABLET BY MOUTH TWICE A DAY    Dispense:  180 tablet    Refill:  3    Follow Up:  Virtual Visit  in 1 year(s)  Signed, Leanor Kail, PA  02/08/2020 3:07 PM    [facility]

## 2020-02-08 NOTE — Assessment & Plan Note (Signed)
Chronic BP well controlled Continue amlodipine 5 mg twice daily, carvedilol 6.25 mg twice daily and ramipril 10 mg twice daily cmp reviewed-kidney function electrolytes normal

## 2020-02-08 NOTE — Assessment & Plan Note (Signed)
Chronic A1c well controlled, 6.2% Continue diabetic diet and increased activity

## 2020-02-08 NOTE — Assessment & Plan Note (Signed)
Chronic Slowly getting worse No symptoms suggestive of active GI bleed She is asymptomatic At this point we will wait to recheck her blood counts and iron levels in 6 months, sooner if needed

## 2020-02-08 NOTE — Assessment & Plan Note (Signed)
Chronic Depression overall controlled Continue Cymbalta 60 mg daily

## 2020-02-08 NOTE — Assessment & Plan Note (Signed)
Chronic Lipids are well controlled Continue atorvastatin 40 mg daily Regular exercise and healthy diet encouraged

## 2020-02-08 NOTE — Patient Instructions (Signed)
Medication Instructions:  Your physician recommends that you continue on your current medications as directed. Please refer to the Current Medication list given to you today.  *If you need a refill on your cardiac medications before your next appointment, please call your pharmacy*   Lab Work: None ordered  If you have labs (blood work) drawn today and your tests are completely normal, you will receive your results only by: . MyChart Message (if you have MyChart) OR . A paper copy in the mail If you have any lab test that is abnormal or we need to change your treatment, we will call you to review the results.   Testing/Procedures: None ordered   Follow-Up: At [facility], you and your health needs are our priority.  As part of our continuing mission to provide you with exceptional heart care, we have created designated Provider Care Teams.  These Care Teams include your primary Cardiologist (physician) and Advanced Practice Providers (APPs -  Physician Assistants and Nurse Practitioners) who all work together to provide you with the care you need, when you need it.  We recommend signing up for the patient portal called "MyChart".  Sign up information is provided on this After Visit Summary.  MyChart is used to connect with patients for Virtual Visits (Telemedicine).  Patients are able to view lab/test results, encounter notes, upcoming appointments, etc.  Non-urgent messages can be sent to your provider as well.   To learn more about what you can do with MyChart, go to [email or web address].    Your next appointment:   12 month(s)  The format for your next appointment:   In Person  Provider:   You may see Paula Ross, MD or one of the following Advanced Practice Providers on your designated Care Team:    Scott Weaver, PA-C  Vin Bhagat, PA-C    Other Instructions   

## 2020-02-09 DIAGNOSIS — M6281 Muscle weakness (generalized): Secondary | ICD-10-CM | POA: Diagnosis not present

## 2020-02-09 DIAGNOSIS — R41841 Cognitive communication deficit: Secondary | ICD-10-CM | POA: Diagnosis not present

## 2020-02-09 DIAGNOSIS — R488 Other symbolic dysfunctions: Secondary | ICD-10-CM | POA: Diagnosis not present

## 2020-02-09 DIAGNOSIS — M5459 Other low back pain: Secondary | ICD-10-CM | POA: Diagnosis not present

## 2020-02-09 DIAGNOSIS — M25562 Pain in left knee: Secondary | ICD-10-CM | POA: Diagnosis not present

## 2020-02-09 DIAGNOSIS — M25561 Pain in right knee: Secondary | ICD-10-CM | POA: Diagnosis not present

## 2020-02-12 DIAGNOSIS — R488 Other symbolic dysfunctions: Secondary | ICD-10-CM | POA: Diagnosis not present

## 2020-02-12 DIAGNOSIS — M5459 Other low back pain: Secondary | ICD-10-CM | POA: Diagnosis not present

## 2020-02-12 DIAGNOSIS — R41841 Cognitive communication deficit: Secondary | ICD-10-CM | POA: Diagnosis not present

## 2020-02-12 DIAGNOSIS — M25561 Pain in right knee: Secondary | ICD-10-CM | POA: Diagnosis not present

## 2020-02-12 DIAGNOSIS — M6281 Muscle weakness (generalized): Secondary | ICD-10-CM | POA: Diagnosis not present

## 2020-02-12 DIAGNOSIS — M25562 Pain in left knee: Secondary | ICD-10-CM | POA: Diagnosis not present

## 2020-02-13 DIAGNOSIS — M25561 Pain in right knee: Secondary | ICD-10-CM | POA: Diagnosis not present

## 2020-02-13 DIAGNOSIS — M5459 Other low back pain: Secondary | ICD-10-CM | POA: Diagnosis not present

## 2020-02-13 DIAGNOSIS — R488 Other symbolic dysfunctions: Secondary | ICD-10-CM | POA: Diagnosis not present

## 2020-02-13 DIAGNOSIS — M25562 Pain in left knee: Secondary | ICD-10-CM | POA: Diagnosis not present

## 2020-02-13 DIAGNOSIS — R41841 Cognitive communication deficit: Secondary | ICD-10-CM | POA: Diagnosis not present

## 2020-02-13 DIAGNOSIS — M6281 Muscle weakness (generalized): Secondary | ICD-10-CM | POA: Diagnosis not present

## 2020-02-14 DIAGNOSIS — M6281 Muscle weakness (generalized): Secondary | ICD-10-CM | POA: Diagnosis not present

## 2020-02-14 DIAGNOSIS — R41841 Cognitive communication deficit: Secondary | ICD-10-CM | POA: Diagnosis not present

## 2020-02-14 DIAGNOSIS — M25562 Pain in left knee: Secondary | ICD-10-CM | POA: Diagnosis not present

## 2020-02-14 DIAGNOSIS — R488 Other symbolic dysfunctions: Secondary | ICD-10-CM | POA: Diagnosis not present

## 2020-02-14 DIAGNOSIS — M5459 Other low back pain: Secondary | ICD-10-CM | POA: Diagnosis not present

## 2020-02-14 DIAGNOSIS — M25561 Pain in right knee: Secondary | ICD-10-CM | POA: Diagnosis not present

## 2020-02-15 ENCOUNTER — Other Ambulatory Visit: Payer: Self-pay | Admitting: Internal Medicine

## 2020-02-15 DIAGNOSIS — M5459 Other low back pain: Secondary | ICD-10-CM | POA: Diagnosis not present

## 2020-02-15 DIAGNOSIS — M6281 Muscle weakness (generalized): Secondary | ICD-10-CM | POA: Diagnosis not present

## 2020-02-15 DIAGNOSIS — M25562 Pain in left knee: Secondary | ICD-10-CM | POA: Diagnosis not present

## 2020-02-15 DIAGNOSIS — M25561 Pain in right knee: Secondary | ICD-10-CM | POA: Diagnosis not present

## 2020-02-15 DIAGNOSIS — R41841 Cognitive communication deficit: Secondary | ICD-10-CM | POA: Diagnosis not present

## 2020-02-15 DIAGNOSIS — R488 Other symbolic dysfunctions: Secondary | ICD-10-CM | POA: Diagnosis not present

## 2020-02-15 DIAGNOSIS — I25810 Atherosclerosis of coronary artery bypass graft(s) without angina pectoris: Secondary | ICD-10-CM

## 2020-02-16 DIAGNOSIS — M5459 Other low back pain: Secondary | ICD-10-CM | POA: Diagnosis not present

## 2020-02-16 DIAGNOSIS — R488 Other symbolic dysfunctions: Secondary | ICD-10-CM | POA: Diagnosis not present

## 2020-02-16 DIAGNOSIS — M6281 Muscle weakness (generalized): Secondary | ICD-10-CM | POA: Diagnosis not present

## 2020-02-16 DIAGNOSIS — R41841 Cognitive communication deficit: Secondary | ICD-10-CM | POA: Diagnosis not present

## 2020-02-16 DIAGNOSIS — M25561 Pain in right knee: Secondary | ICD-10-CM | POA: Diagnosis not present

## 2020-02-16 DIAGNOSIS — M25562 Pain in left knee: Secondary | ICD-10-CM | POA: Diagnosis not present

## 2020-02-19 DIAGNOSIS — M25562 Pain in left knee: Secondary | ICD-10-CM | POA: Diagnosis not present

## 2020-02-19 DIAGNOSIS — R41841 Cognitive communication deficit: Secondary | ICD-10-CM | POA: Diagnosis not present

## 2020-02-19 DIAGNOSIS — M5459 Other low back pain: Secondary | ICD-10-CM | POA: Diagnosis not present

## 2020-02-19 DIAGNOSIS — R488 Other symbolic dysfunctions: Secondary | ICD-10-CM | POA: Diagnosis not present

## 2020-02-19 DIAGNOSIS — M25561 Pain in right knee: Secondary | ICD-10-CM | POA: Diagnosis not present

## 2020-02-19 DIAGNOSIS — M6281 Muscle weakness (generalized): Secondary | ICD-10-CM | POA: Diagnosis not present

## 2020-02-21 DIAGNOSIS — M25562 Pain in left knee: Secondary | ICD-10-CM | POA: Diagnosis not present

## 2020-02-21 DIAGNOSIS — M25561 Pain in right knee: Secondary | ICD-10-CM | POA: Diagnosis not present

## 2020-02-21 DIAGNOSIS — R488 Other symbolic dysfunctions: Secondary | ICD-10-CM | POA: Diagnosis not present

## 2020-02-21 DIAGNOSIS — M5459 Other low back pain: Secondary | ICD-10-CM | POA: Diagnosis not present

## 2020-02-21 DIAGNOSIS — M6281 Muscle weakness (generalized): Secondary | ICD-10-CM | POA: Diagnosis not present

## 2020-02-21 DIAGNOSIS — R41841 Cognitive communication deficit: Secondary | ICD-10-CM | POA: Diagnosis not present

## 2020-02-23 DIAGNOSIS — M25561 Pain in right knee: Secondary | ICD-10-CM | POA: Diagnosis not present

## 2020-02-23 DIAGNOSIS — M6281 Muscle weakness (generalized): Secondary | ICD-10-CM | POA: Diagnosis not present

## 2020-02-23 DIAGNOSIS — M25562 Pain in left knee: Secondary | ICD-10-CM | POA: Diagnosis not present

## 2020-02-23 DIAGNOSIS — R488 Other symbolic dysfunctions: Secondary | ICD-10-CM | POA: Diagnosis not present

## 2020-02-23 DIAGNOSIS — M5459 Other low back pain: Secondary | ICD-10-CM | POA: Diagnosis not present

## 2020-02-23 DIAGNOSIS — R41841 Cognitive communication deficit: Secondary | ICD-10-CM | POA: Diagnosis not present

## 2020-02-26 DIAGNOSIS — R41841 Cognitive communication deficit: Secondary | ICD-10-CM | POA: Diagnosis not present

## 2020-02-26 DIAGNOSIS — M6281 Muscle weakness (generalized): Secondary | ICD-10-CM | POA: Diagnosis not present

## 2020-02-26 DIAGNOSIS — M25562 Pain in left knee: Secondary | ICD-10-CM | POA: Diagnosis not present

## 2020-02-26 DIAGNOSIS — M5459 Other low back pain: Secondary | ICD-10-CM | POA: Diagnosis not present

## 2020-02-26 DIAGNOSIS — R488 Other symbolic dysfunctions: Secondary | ICD-10-CM | POA: Diagnosis not present

## 2020-02-26 DIAGNOSIS — M25561 Pain in right knee: Secondary | ICD-10-CM | POA: Diagnosis not present

## 2020-02-27 DIAGNOSIS — M25561 Pain in right knee: Secondary | ICD-10-CM | POA: Diagnosis not present

## 2020-02-27 DIAGNOSIS — R41841 Cognitive communication deficit: Secondary | ICD-10-CM | POA: Diagnosis not present

## 2020-02-27 DIAGNOSIS — M5459 Other low back pain: Secondary | ICD-10-CM | POA: Diagnosis not present

## 2020-02-27 DIAGNOSIS — M25562 Pain in left knee: Secondary | ICD-10-CM | POA: Diagnosis not present

## 2020-02-27 DIAGNOSIS — R488 Other symbolic dysfunctions: Secondary | ICD-10-CM | POA: Diagnosis not present

## 2020-02-27 DIAGNOSIS — M6281 Muscle weakness (generalized): Secondary | ICD-10-CM | POA: Diagnosis not present

## 2020-02-28 DIAGNOSIS — M25562 Pain in left knee: Secondary | ICD-10-CM | POA: Diagnosis not present

## 2020-02-28 DIAGNOSIS — R488 Other symbolic dysfunctions: Secondary | ICD-10-CM | POA: Diagnosis not present

## 2020-02-28 DIAGNOSIS — M6281 Muscle weakness (generalized): Secondary | ICD-10-CM | POA: Diagnosis not present

## 2020-02-28 DIAGNOSIS — M25561 Pain in right knee: Secondary | ICD-10-CM | POA: Diagnosis not present

## 2020-02-28 DIAGNOSIS — M5459 Other low back pain: Secondary | ICD-10-CM | POA: Diagnosis not present

## 2020-02-28 DIAGNOSIS — R41841 Cognitive communication deficit: Secondary | ICD-10-CM | POA: Diagnosis not present

## 2020-03-01 DIAGNOSIS — M5459 Other low back pain: Secondary | ICD-10-CM | POA: Diagnosis not present

## 2020-03-01 DIAGNOSIS — R488 Other symbolic dysfunctions: Secondary | ICD-10-CM | POA: Diagnosis not present

## 2020-03-01 DIAGNOSIS — R41841 Cognitive communication deficit: Secondary | ICD-10-CM | POA: Diagnosis not present

## 2020-03-01 DIAGNOSIS — M25562 Pain in left knee: Secondary | ICD-10-CM | POA: Diagnosis not present

## 2020-03-01 DIAGNOSIS — M6281 Muscle weakness (generalized): Secondary | ICD-10-CM | POA: Diagnosis not present

## 2020-03-01 DIAGNOSIS — M25561 Pain in right knee: Secondary | ICD-10-CM | POA: Diagnosis not present

## 2020-03-02 ENCOUNTER — Other Ambulatory Visit: Payer: Self-pay

## 2020-03-02 ENCOUNTER — Emergency Department (HOSPITAL_COMMUNITY): Payer: Medicare Other

## 2020-03-02 ENCOUNTER — Emergency Department (HOSPITAL_COMMUNITY)
Admission: EM | Admit: 2020-03-02 | Discharge: 2020-03-02 | Disposition: A | Discharge status: Home / Self Care | Payer: Medicare Other | Attending: Emergency Medicine | Admitting: Emergency Medicine

## 2020-03-02 ENCOUNTER — Encounter (HOSPITAL_COMMUNITY): Payer: Self-pay

## 2020-03-02 DIAGNOSIS — I700 Atherosclerosis of aorta: Secondary | ICD-10-CM | POA: Diagnosis not present

## 2020-03-02 DIAGNOSIS — M545 Low back pain, unspecified: Secondary | ICD-10-CM | POA: Diagnosis not present

## 2020-03-02 DIAGNOSIS — M546 Pain in thoracic spine: Secondary | ICD-10-CM | POA: Insufficient documentation

## 2020-03-02 DIAGNOSIS — E119 Type 2 diabetes mellitus without complications: Secondary | ICD-10-CM | POA: Diagnosis not present

## 2020-03-02 DIAGNOSIS — I10 Essential (primary) hypertension: Secondary | ICD-10-CM | POA: Diagnosis not present

## 2020-03-02 DIAGNOSIS — M549 Dorsalgia, unspecified: Secondary | ICD-10-CM | POA: Diagnosis not present

## 2020-03-02 DIAGNOSIS — I2510 Atherosclerotic heart disease of native coronary artery without angina pectoris: Secondary | ICD-10-CM | POA: Diagnosis not present

## 2020-03-02 DIAGNOSIS — F0390 Unspecified dementia without behavioral disturbance: Secondary | ICD-10-CM | POA: Diagnosis not present

## 2020-03-02 DIAGNOSIS — Z87891 Personal history of nicotine dependence: Secondary | ICD-10-CM | POA: Diagnosis not present

## 2020-03-02 DIAGNOSIS — M5459 Other low back pain: Secondary | ICD-10-CM | POA: Diagnosis not present

## 2020-03-02 DIAGNOSIS — Z7401 Bed confinement status: Secondary | ICD-10-CM | POA: Diagnosis not present

## 2020-03-02 DIAGNOSIS — Z79899 Other long term (current) drug therapy: Secondary | ICD-10-CM | POA: Insufficient documentation

## 2020-03-02 DIAGNOSIS — Z7982 Long term (current) use of aspirin: Secondary | ICD-10-CM | POA: Diagnosis not present

## 2020-03-02 DIAGNOSIS — I959 Hypotension, unspecified: Secondary | ICD-10-CM | POA: Diagnosis not present

## 2020-03-02 DIAGNOSIS — R52 Pain, unspecified: Secondary | ICD-10-CM | POA: Diagnosis not present

## 2020-03-02 DIAGNOSIS — M4184 Other forms of scoliosis, thoracic region: Secondary | ICD-10-CM | POA: Diagnosis not present

## 2020-03-02 DIAGNOSIS — G8929 Other chronic pain: Secondary | ICD-10-CM

## 2020-03-02 DIAGNOSIS — M2550 Pain in unspecified joint: Secondary | ICD-10-CM | POA: Diagnosis not present

## 2020-03-02 MED ORDER — DIAZEPAM 2 MG PO TABS
2.0000 mg | ORAL_TABLET | Freq: Once | ORAL | Status: AC
  Administered 2020-03-02: 2 mg via ORAL
  Filled 2020-03-02: qty 1

## 2020-03-02 MED ORDER — HYDROCODONE-ACETAMINOPHEN 5-325 MG PO TABS
1.0000 | ORAL_TABLET | Freq: Four times a day (QID) | ORAL | 0 refills | Status: DC | PRN
Start: 2020-03-02 — End: 2020-03-04

## 2020-03-02 MED ORDER — DIAZEPAM 2 MG PO TABS: 2.0000 mg | ORAL_TABLET | Freq: Four times a day (QID) | ORAL | 0 refills | Status: DC | PRN

## 2020-03-02 MED ORDER — OXYCODONE-ACETAMINOPHEN 5-325 MG PO TABS
1.0000 | ORAL_TABLET | Freq: Once | ORAL | Status: AC
  Administered 2020-03-02: 1 via ORAL
  Filled 2020-03-02: qty 1

## 2020-03-02 NOTE — ED Notes (Addendum)
Pt's daughter updated; discharge instructions discussed

## 2020-03-02 NOTE — ED Notes (Signed)
Pt given food and beverage per Dr. Zenia Resides

## 2020-03-02 NOTE — ED Triage Notes (Signed)
Pt bib ems from [facility]; c/o back pain x 3-4 weeks; lower back, worse today; radiates to bilateral hips and down legs, pain 6/10, when pt repositioned pain decreased to a 3 or 4; hx HTN, 192/90, p 84, 98% RA, T 98.7, cbg 119; pt has no other complaints; denies injury

## 2020-03-02 NOTE — ED Notes (Signed)
Report given to [employer], staff at [facility]

## 2020-03-02 NOTE — ED Notes (Signed)
Pt transported to xray 

## 2020-03-02 NOTE — ED Provider Notes (Signed)
[facility] EMERGENCY DEPARTMENT Provider Note   CSN: [id number] Arrival date & time: 03/02/20  0756     History Chief Complaint  Patient presents with  . Back Pain    [name] is a 86 y.o. female.  86 year old female with history of chronic back pain presents with 3 to 4-week history of worsening mid thoracic and lower lumbar spine pain.  Pain is unresponsive to home anti-inflammatories.  Denies any bowel or bladder dysfunction.  No distal numbness or tingling to her legs.  Denies any abdominal comfort.  Denies any numbness around her abdominal area.  Pain is characterizes sharp and worse with movement.  Denies any new trauma        Past Medical History:  Diagnosis Date  . CAD, NATIVE VESSEL 06/09/2008  . DIABETES MELLITUS, TYPE II   . FIBROMYALGIA   . GERD   . Gout   . HIATAL HERNIA   . HYPERLIPIDEMIA   . HYPERTENSION   . Insomnia   . OA (osteoarthritis)    severe, shoulders, hands - inflammatory, ?RA  . S/P CABG x 1   . S/P inguinal hernia repair   . TUBULOVILLOUS ADENOMA, 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   colo 04/2012 - no polyps - no further screening planned (age >48)  . UNSPECIFIED ANEMIA     Patient Active Problem List   Diagnosis Date Noted  . Mild dementia ([place]) 07/27/2017  . Chronic RUQ pain 05/28/2016  . Right knee pain 06/14/2015  . Diabetic neuropathy ([place]) 03/21/2014  . Bunion 03/21/2014  . PVD (pulmonary valve disease) 01/23/2014  . Onychomycosis 12/20/2013  . PVD (peripheral vascular disease) ([place]) 12/19/2013  . Gout 12/13/2013  . Depression   . Spinal stenosis of lumbar region 04/24/2013  . Arthritis of shoulder region, left 09/11/2011  . Type II diabetes mellitus with neurological manifestations ([place]) 01/17/2009  . Anemia 10/15/2008  . CAD, NATIVE VESSEL 06/09/2008  . Hyperlipidemia 06/15/2007  . Essential hypertension 06/15/2007  . GERD 06/15/2007  . Fibromyalgia syndrome 06/15/2007  . TUBULOVILLOUS ADENOMA, COLON  03/02/2007  . DIVERTICULOSIS, 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    Past Surgical History:  Procedure Laterality Date  . ABDOMINAL HYSTERECTOMY  1981  . APPENDECTOMY    . CATARACT EXTRACTION, BILATERAL    . CORONARY ARTERY BYPASS GRAFT  2010  . HERNIA REPAIR     INGUINAL  . I & D EXTREMITY Left 12/07/2017   Procedure: IRRIGATION AND DEBRIDEMENT EXTREMITY WITH FOREIGN BODY REMOVAL;  Surgeon: Nicholes Stairs, MD;  Location: [place];  Service: Orthopedics;  Laterality: Left;  . JOINT REPLACEMENT    . OVARIAN CYST REMOVAL    . REPLACEMENT TOTAL KNEE BILATERAL Bilateral    Rt-1974, Lt -1989  . REVERSE SHOULDER ARTHROPLASTY  09/08/2011   Procedure: REVERSE SHOULDER ARTHROPLASTY;  Surgeon: Nita Sells, MD;  Location: [place];  Service: Orthopedics;  Laterality: Left;  left reverse total shoulder arthroplasty  . ROTATOR CUFF REPAIR     Left     OB History   No obstetric history on file.     Family History  Problem Relation Age of Onset  . Cancer Mother        Stomach  . Stomach cancer Mother 11  . Hypertension Mother   . Heart disease Father 66       MI  . Diabetes Father   . Heart attack Father   . Diabetes Daughter   . Hypertension Daughter   . Vascular Disease Sister   .  Stroke Sister   . Dementia Sister   . Diabetes Sister   . Parkinsonism Sister   . Stroke Sister   . Heart disease Sister   . Heart attack Brother   . Anesthesia problems Neg Hx   . Colon cancer Neg Hx     Social History   Tobacco Use  . Smoking status: Former Smoker    Quit date: 04/26/1970    Years since quitting: 49.8  . Smokeless tobacco: Never Used  Vaping Use  . Vaping Use: Never used  Substance Use Topics  . Alcohol use: Yes    Alcohol/week: 1.0 standard drink    Types: 1 Glasses of wine per week  . Drug use: No    Home Medications Prior to Admission medications   Medication Sig Start Date End Date Taking? Authorizing Provider  acetaminophen (TYLENOL) 500 MG tablet Take 1,000 mg by  mouth 2 (two) times daily.     [provider]  amLODipine (NORVASC) 5 MG tablet TAKE ONE TABLET BY MOUTH TWICE A DAY 02/16/20   Binnie Rail, MD  aspirin EC 81 MG tablet Take 81 mg by mouth daily.    [provider]  atorvastatin (LIPITOR) 40 MG tablet Take 1 tablet (40 mg total) by mouth every other day. 02/08/20   Bhagat, Crista Luria, PA  Biotin 1 MG CAPS Take 1 capsule by mouth daily.    [provider]  busPIRone (BUSPAR) 5 MG tablet Take 1 tablet (5 mg total) by mouth 2 (two) times daily. 01/12/20   Cameron Sprang, MD  carvedilol (COREG) 6.25 MG tablet TAKE ONE TABLET BY MOUTH TWICE A DAY 02/08/20   Bhagat, Bhavinkumar, PA  cetirizine (ZYRTEC) 10 MG tablet TAKE ONE TABLET BY MOUTH DAILY Patient taking differently: Take 10 mg by mouth at bedtime.  02/22/17   Binnie Rail, MD  Cholecalciferol (D3-1000) 25 MCG (1000 UT) capsule Take 1,000 Units by mouth daily.    [provider]  diclofenac sodium (VOLTAREN) 1 % GEL Apply 2 g topically 4 (four) times daily as needed (pain). 08/25/18   Binnie Rail, MD  donepezil (ARICEPT) 10 MG tablet Take 1 tablet daily 05/26/19   Cameron Sprang, MD  DULoxetine (CYMBALTA) 60 MG capsule TAKE ONE CAPSULE BY MOUTH DAILY 11/27/19   Cameron Sprang, MD  gabapentin (NEURONTIN) 300 MG capsule TAKE ONE CAPSULE BY MOUTH EVERY NIGHT AT BEDTIME 11/13/19   Binnie Rail, MD  meloxicam (MOBIC) 7.5 MG tablet TAKE ONE TABLET BY MOUTH DAILY 12/11/19   Binnie Rail, MD  memantine (NAMENDA) 10 MG tablet Take 1 tablet (10 mg total) by mouth 2 (two) times daily. 01/12/20   Cameron Sprang, MD  omeprazole (PRILOSEC) 40 MG capsule TAKE ONE CAPSULE BY MOUTH DAILY 02/15/20   Binnie Rail, MD  ramipril (ALTACE) 10 MG capsule TAKE ONE CAPSULE BY MOUTH TWICE A DAY 02/15/20   Binnie Rail, MD    Allergies    Patient has no known allergies.  Review of Systems   Review of Systems  All other systems reviewed and are negative.   Physical  Exam Updated Vital Signs BP (!) 155/80   Pulse 65   Temp 98.3 F (36.8 C)   Resp 16   Ht 1.499 m (4\' 11" )   Wt 74.8 kg   SpO2 98%   BMI 33.33 kg/m   Physical Exam Vitals and nursing note reviewed.  Constitutional:      General:  She is not in acute distress.    Appearance: Normal appearance. She is well-developed. She is not toxic-appearing.  HENT:     Head: Normocephalic and atraumatic.  Eyes:     General: Lids are normal.     Conjunctiva/sclera: Conjunctivae normal.     Pupils: Pupils are equal, round, and reactive to light.  Neck:     Thyroid: No thyroid mass.     Trachea: No tracheal deviation.  Cardiovascular:     Rate and Rhythm: Normal rate and regular rhythm.     Heart sounds: Normal heart sounds. No murmur heard.  No gallop.   Pulmonary:     Effort: Pulmonary effort is normal. No respiratory distress.     Breath sounds: Normal breath sounds. No stridor. No decreased breath sounds, wheezing, rhonchi or rales.  Abdominal:     General: Bowel sounds are normal. There is no distension.     Palpations: Abdomen is soft.     Tenderness: There is no abdominal tenderness. There is no rebound.  Musculoskeletal:        General: No tenderness. Normal range of motion.     Cervical back: Normal range of motion and neck supple.       Back:  Skin:    General: Skin is warm and dry.     Findings: No abrasion or rash.  Neurological:     Mental Status: She is alert and oriented to person, place, and time.     GCS: GCS eye subscore is 4. GCS verbal subscore is 5. GCS motor subscore is 6.     Cranial Nerves: No cranial nerve deficit.     Sensory: No sensory deficit.     Motor: Motor function is intact.     Comments: Strength is 5 of 5 in bilateral lower extremities  Psychiatric:        Speech: Speech normal.        Behavior: Behavior normal.     ED Results / Procedures / Treatments   Labs (all labs ordered are listed, but only abnormal results are displayed) Labs  Reviewed - No data to display  EKG None  Radiology No results found.  Procedures Procedures (including critical care time)  Medications Ordered in ED Medications  oxyCODONE-acetaminophen (PERCOCET/ROXICET) 5-325 MG per tablet 1 tablet (has no administration in time range)  diazepam (VALIUM) tablet 2 mg (has no administration in time range)    ED Course  I have reviewed the triage vital signs and the nursing notes.  Pertinent labs & imaging results that were available during my care of the patient were reviewed by me and considered in my medical decision making (see chart for details).    MDM Rules/Calculators/A&P                          Patient medicated for pain and feels better here.  X-rays are without acute injury.  Will discharge home Final Clinical Impression(s) / ED Diagnoses Final diagnoses:  None    Rx / DC Orders ED Discharge Orders    None       Lacretia Leigh, MD 03/02/20 [phone]

## 2020-03-02 NOTE — ED Notes (Signed)
Attempted report to [facility]; no answer

## 2020-03-04 ENCOUNTER — Other Ambulatory Visit: Payer: Self-pay

## 2020-03-04 ENCOUNTER — Encounter: Payer: Self-pay | Admitting: Neurology

## 2020-03-04 ENCOUNTER — Ambulatory Visit (INDEPENDENT_AMBULATORY_CARE_PROVIDER_SITE_OTHER): Payer: Medicare Other | Admitting: Neurology

## 2020-03-04 DIAGNOSIS — R488 Other symbolic dysfunctions: Secondary | ICD-10-CM | POA: Diagnosis not present

## 2020-03-04 DIAGNOSIS — M5459 Other low back pain: Secondary | ICD-10-CM | POA: Diagnosis not present

## 2020-03-04 DIAGNOSIS — I25810 Atherosclerosis of coronary artery bypass graft(s) without angina pectoris: Secondary | ICD-10-CM

## 2020-03-04 DIAGNOSIS — M25562 Pain in left knee: Secondary | ICD-10-CM | POA: Diagnosis not present

## 2020-03-04 DIAGNOSIS — F03A0 Unspecified dementia, mild, without behavioral disturbance, psychotic disturbance, mood disturbance, and anxiety: Secondary | ICD-10-CM

## 2020-03-04 DIAGNOSIS — F0390 Unspecified dementia without behavioral disturbance: Secondary | ICD-10-CM

## 2020-03-04 DIAGNOSIS — R41841 Cognitive communication deficit: Secondary | ICD-10-CM | POA: Diagnosis not present

## 2020-03-04 DIAGNOSIS — M6281 Muscle weakness (generalized): Secondary | ICD-10-CM | POA: Diagnosis not present

## 2020-03-04 DIAGNOSIS — M25561 Pain in right knee: Secondary | ICD-10-CM | POA: Diagnosis not present

## 2020-03-04 DIAGNOSIS — Z23 Encounter for immunization: Secondary | ICD-10-CM | POA: Diagnosis not present

## 2020-03-04 MED ORDER — DONEPEZIL HCL 10 MG PO TABS: ORAL_TABLET | ORAL | 3 refills | Status: DC

## 2020-03-04 MED ORDER — BUSPIRONE HCL 10 MG PO TABS: 10.0000 mg | ORAL_TABLET | Freq: Two times a day (BID) | ORAL | 11 refills | Status: DC

## 2020-03-04 MED ORDER — DULOXETINE HCL 60 MG PO CPEP
60.0000 mg | ORAL_CAPSULE | Freq: Every day | ORAL | 3 refills | Status: DC
Start: 2020-03-04 — End: 2020-12-12

## 2020-03-04 MED ORDER — MEMANTINE HCL 10 MG PO TABS: 10.0000 mg | ORAL_TABLET | Freq: Two times a day (BID) | ORAL | 3 refills | Status: DC

## 2020-03-04 NOTE — Patient Instructions (Signed)
1. Increase Buspar to 10mg  twice a day  2. Continue all your other medications  3. Follow-up in 6-8 months, call for any changes   FALL PRECAUTIONS: Be cautious when walking. Scan the area for obstacles that may increase the risk of trips and falls. When getting up in the mornings, sit up at the edge of the bed for a few minutes before getting out of bed. Consider elevating the bed at the head end to avoid drop of blood pressure when getting up. Walk always in a well-lit room (use night lights in the walls). Avoid area rugs or power cords from appliances in the middle of the walkways. Use a walker or a cane if necessary and consider physical therapy for balance exercise. Get your eyesight checked regularly.  HOME SAFETY: Consider the safety of the kitchen when operating appliances like stoves, microwave oven, and blender. Consider having supervision and share cooking responsibilities until no longer able to participate in those. Accidents with firearms and other hazards in the house should be identified and addressed as well.  ABILITY TO BE LEFT ALONE: If patient is unable to contact 911 operator, consider using LifeLine, or when the need is there, arrange for someone to stay with patients. Smoking is a fire hazard, consider supervision or cessation. Risk of wandering should be assessed by caregiver and if detected at any point, supervision and safe proof recommendations should be instituted.  MEDICATION SUPERVISION: Inability to self-administer medication needs to be constantly addressed. Implement a mechanism to ensure safe administration of the medications.  RECOMMENDATIONS FOR ALL PATIENTS WITH MEMORY PROBLEMS: 1. Continue to exercise (Recommend 30 minutes of walking everyday, or 3 hours every week) 2. Increase social interactions - continue going to [place] and enjoy social gatherings with friends and family 3. Eat healthy, avoid fried foods and eat more fruits and vegetables 4. Maintain  adequate blood pressure, blood sugar, and blood cholesterol level. Reducing the risk of stroke and cardiovascular disease also helps promoting better memory. 5. Avoid stressful situations. Live a simple life and avoid aggravations. Organize your time and prepare for the next day in anticipation. 6. Sleep well, avoid any interruptions of sleep and avoid any distractions in the bedroom that may interfere with adequate sleep quality 7. Avoid sugar, avoid sweets as there is a strong link between excessive sugar intake, diabetes, and cognitive impairment The Mediterranean diet has been shown to help patients reduce the risk of progressive memory disorders and reduces cardiovascular risk. This includes eating fish, eat fruits and green leafy vegetables, nuts like almonds and hazelnuts, walnuts, and also use olive oil. Avoid fast foods and fried foods as much as possible. Avoid sweets and sugar as sugar use has been linked to worsening of memory function.  There is always a concern of gradual progression of memory problems. If this is the case, then we may need to adjust level of care according to patient needs. Support, both to the patient and caregiver, should then be put into place.
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
## 2020-03-06 DIAGNOSIS — M25561 Pain in right knee: Secondary | ICD-10-CM | POA: Diagnosis not present

## 2020-03-06 DIAGNOSIS — R41841 Cognitive communication deficit: Secondary | ICD-10-CM | POA: Diagnosis not present

## 2020-03-06 DIAGNOSIS — R488 Other symbolic dysfunctions: Secondary | ICD-10-CM | POA: Diagnosis not present

## 2020-03-06 DIAGNOSIS — M5459 Other low back pain: Secondary | ICD-10-CM | POA: Diagnosis not present

## 2020-03-06 DIAGNOSIS — M25562 Pain in left knee: Secondary | ICD-10-CM | POA: Diagnosis not present

## 2020-03-06 DIAGNOSIS — M6281 Muscle weakness (generalized): Secondary | ICD-10-CM | POA: Diagnosis not present

## 2020-03-07 DIAGNOSIS — M5459 Other low back pain: Secondary | ICD-10-CM | POA: Diagnosis not present

## 2020-03-07 DIAGNOSIS — M6281 Muscle weakness (generalized): Secondary | ICD-10-CM | POA: Diagnosis not present

## 2020-03-07 DIAGNOSIS — R41841 Cognitive communication deficit: Secondary | ICD-10-CM | POA: Diagnosis not present

## 2020-03-07 DIAGNOSIS — R488 Other symbolic dysfunctions: Secondary | ICD-10-CM | POA: Diagnosis not present

## 2020-03-07 DIAGNOSIS — M25561 Pain in right knee: Secondary | ICD-10-CM | POA: Diagnosis not present

## 2020-03-07 DIAGNOSIS — M25562 Pain in left knee: Secondary | ICD-10-CM | POA: Diagnosis not present

## 2020-03-11 DIAGNOSIS — M5459 Other low back pain: Secondary | ICD-10-CM | POA: Diagnosis not present

## 2020-03-11 DIAGNOSIS — R488 Other symbolic dysfunctions: Secondary | ICD-10-CM | POA: Diagnosis not present

## 2020-03-11 DIAGNOSIS — M25561 Pain in right knee: Secondary | ICD-10-CM | POA: Diagnosis not present

## 2020-03-11 DIAGNOSIS — M25562 Pain in left knee: Secondary | ICD-10-CM | POA: Diagnosis not present

## 2020-03-11 DIAGNOSIS — M6281 Muscle weakness (generalized): Secondary | ICD-10-CM | POA: Diagnosis not present

## 2020-03-11 DIAGNOSIS — R41841 Cognitive communication deficit: Secondary | ICD-10-CM | POA: Diagnosis not present

## 2020-04-17 ENCOUNTER — Telehealth: Payer: Self-pay | Admitting: Internal Medicine

## 2020-04-17 DIAGNOSIS — F03A0 Unspecified dementia, mild, without behavioral disturbance, psychotic disturbance, mood disturbance, and anxiety: Secondary | ICD-10-CM

## 2020-04-17 DIAGNOSIS — F3289 Other specified depressive episodes: Secondary | ICD-10-CM

## 2020-04-17 NOTE — Telephone Encounter (Signed)
Patients daughter called and is requesting a psychiatry  referral for her mother to Dr. Archer Asa. Patients daughter is requesting a call back at [phone]   Phone: [phone] Ext [phone]

## 2020-04-17 NOTE — Telephone Encounter (Signed)
° °  Patient's daughter calling to request order for OT and speech be faxed to [facility] at [facility]. She states staff at facility has faxed office forms as well  Phone [phone] Fax [phone]

## 2020-04-18 NOTE — Telephone Encounter (Signed)
ordered

## 2020-04-23 DIAGNOSIS — R488 Other symbolic dysfunctions: Secondary | ICD-10-CM | POA: Diagnosis not present

## 2020-04-23 DIAGNOSIS — F0390 Unspecified dementia without behavioral disturbance: Secondary | ICD-10-CM | POA: Diagnosis not present

## 2020-04-23 DIAGNOSIS — R41841 Cognitive communication deficit: Secondary | ICD-10-CM | POA: Diagnosis not present

## 2020-04-23 DIAGNOSIS — M6281 Muscle weakness (generalized): Secondary | ICD-10-CM | POA: Diagnosis not present

## 2020-04-24 DIAGNOSIS — F0390 Unspecified dementia without behavioral disturbance: Secondary | ICD-10-CM | POA: Diagnosis not present

## 2020-04-24 DIAGNOSIS — M6281 Muscle weakness (generalized): Secondary | ICD-10-CM | POA: Diagnosis not present

## 2020-04-24 DIAGNOSIS — R488 Other symbolic dysfunctions: Secondary | ICD-10-CM | POA: Diagnosis not present

## 2020-04-24 DIAGNOSIS — R41841 Cognitive communication deficit: Secondary | ICD-10-CM | POA: Diagnosis not present

## 2020-04-25 DIAGNOSIS — R488 Other symbolic dysfunctions: Secondary | ICD-10-CM | POA: Diagnosis not present

## 2020-04-25 DIAGNOSIS — R41841 Cognitive communication deficit: Secondary | ICD-10-CM | POA: Diagnosis not present

## 2020-04-25 DIAGNOSIS — M6281 Muscle weakness (generalized): Secondary | ICD-10-CM | POA: Diagnosis not present

## 2020-04-25 DIAGNOSIS — F0390 Unspecified dementia without behavioral disturbance: Secondary | ICD-10-CM | POA: Diagnosis not present

## 2020-04-26 DIAGNOSIS — F0390 Unspecified dementia without behavioral disturbance: Secondary | ICD-10-CM | POA: Diagnosis not present

## 2020-04-26 DIAGNOSIS — R488 Other symbolic dysfunctions: Secondary | ICD-10-CM | POA: Diagnosis not present

## 2020-04-26 DIAGNOSIS — M6281 Muscle weakness (generalized): Secondary | ICD-10-CM | POA: Diagnosis not present

## 2020-04-26 DIAGNOSIS — R41841 Cognitive communication deficit: Secondary | ICD-10-CM | POA: Diagnosis not present

## 2020-04-29 DIAGNOSIS — F0390 Unspecified dementia without behavioral disturbance: Secondary | ICD-10-CM | POA: Diagnosis not present

## 2020-04-29 DIAGNOSIS — M6281 Muscle weakness (generalized): Secondary | ICD-10-CM | POA: Diagnosis not present

## 2020-04-29 DIAGNOSIS — R41841 Cognitive communication deficit: Secondary | ICD-10-CM | POA: Diagnosis not present

## 2020-04-29 DIAGNOSIS — R488 Other symbolic dysfunctions: Secondary | ICD-10-CM | POA: Diagnosis not present

## 2020-04-30 DIAGNOSIS — F0390 Unspecified dementia without behavioral disturbance: Secondary | ICD-10-CM | POA: Diagnosis not present

## 2020-04-30 DIAGNOSIS — R41841 Cognitive communication deficit: Secondary | ICD-10-CM | POA: Diagnosis not present

## 2020-04-30 DIAGNOSIS — R488 Other symbolic dysfunctions: Secondary | ICD-10-CM | POA: Diagnosis not present

## 2020-04-30 DIAGNOSIS — M6281 Muscle weakness (generalized): Secondary | ICD-10-CM | POA: Diagnosis not present

## 2020-05-01 DIAGNOSIS — F0390 Unspecified dementia without behavioral disturbance: Secondary | ICD-10-CM | POA: Diagnosis not present

## 2020-05-01 DIAGNOSIS — M6281 Muscle weakness (generalized): Secondary | ICD-10-CM | POA: Diagnosis not present

## 2020-05-01 DIAGNOSIS — R488 Other symbolic dysfunctions: Secondary | ICD-10-CM | POA: Diagnosis not present

## 2020-05-01 DIAGNOSIS — R41841 Cognitive communication deficit: Secondary | ICD-10-CM | POA: Diagnosis not present

## 2020-05-02 DIAGNOSIS — M6281 Muscle weakness (generalized): Secondary | ICD-10-CM | POA: Diagnosis not present

## 2020-05-02 DIAGNOSIS — R41841 Cognitive communication deficit: Secondary | ICD-10-CM | POA: Diagnosis not present

## 2020-05-02 DIAGNOSIS — R488 Other symbolic dysfunctions: Secondary | ICD-10-CM | POA: Diagnosis not present

## 2020-05-02 DIAGNOSIS — F0390 Unspecified dementia without behavioral disturbance: Secondary | ICD-10-CM | POA: Diagnosis not present

## 2020-05-03 DIAGNOSIS — R488 Other symbolic dysfunctions: Secondary | ICD-10-CM | POA: Diagnosis not present

## 2020-05-03 DIAGNOSIS — M6281 Muscle weakness (generalized): Secondary | ICD-10-CM | POA: Diagnosis not present

## 2020-05-03 DIAGNOSIS — R41841 Cognitive communication deficit: Secondary | ICD-10-CM | POA: Diagnosis not present

## 2020-05-03 DIAGNOSIS — F0390 Unspecified dementia without behavioral disturbance: Secondary | ICD-10-CM | POA: Diagnosis not present

## 2020-05-07 DIAGNOSIS — F0390 Unspecified dementia without behavioral disturbance: Secondary | ICD-10-CM | POA: Diagnosis not present

## 2020-05-07 DIAGNOSIS — R41841 Cognitive communication deficit: Secondary | ICD-10-CM | POA: Diagnosis not present

## 2020-05-07 DIAGNOSIS — R488 Other symbolic dysfunctions: Secondary | ICD-10-CM | POA: Diagnosis not present

## 2020-05-07 DIAGNOSIS — M6281 Muscle weakness (generalized): Secondary | ICD-10-CM | POA: Diagnosis not present

## 2020-05-08 DIAGNOSIS — M6281 Muscle weakness (generalized): Secondary | ICD-10-CM | POA: Diagnosis not present

## 2020-05-08 DIAGNOSIS — R488 Other symbolic dysfunctions: Secondary | ICD-10-CM | POA: Diagnosis not present

## 2020-05-08 DIAGNOSIS — F0390 Unspecified dementia without behavioral disturbance: Secondary | ICD-10-CM | POA: Diagnosis not present

## 2020-05-08 DIAGNOSIS — R41841 Cognitive communication deficit: Secondary | ICD-10-CM | POA: Diagnosis not present

## 2020-05-09 DIAGNOSIS — M6281 Muscle weakness (generalized): Secondary | ICD-10-CM | POA: Diagnosis not present

## 2020-05-09 DIAGNOSIS — R488 Other symbolic dysfunctions: Secondary | ICD-10-CM | POA: Diagnosis not present

## 2020-05-09 DIAGNOSIS — F0390 Unspecified dementia without behavioral disturbance: Secondary | ICD-10-CM | POA: Diagnosis not present

## 2020-05-09 DIAGNOSIS — R41841 Cognitive communication deficit: Secondary | ICD-10-CM | POA: Diagnosis not present

## 2020-05-10 DIAGNOSIS — F0390 Unspecified dementia without behavioral disturbance: Secondary | ICD-10-CM | POA: Diagnosis not present

## 2020-05-10 DIAGNOSIS — M6281 Muscle weakness (generalized): Secondary | ICD-10-CM | POA: Diagnosis not present

## 2020-05-10 DIAGNOSIS — R488 Other symbolic dysfunctions: Secondary | ICD-10-CM | POA: Diagnosis not present

## 2020-05-10 DIAGNOSIS — R41841 Cognitive communication deficit: Secondary | ICD-10-CM | POA: Diagnosis not present

## 2020-05-13 DIAGNOSIS — M6281 Muscle weakness (generalized): Secondary | ICD-10-CM | POA: Diagnosis not present

## 2020-05-13 DIAGNOSIS — R488 Other symbolic dysfunctions: Secondary | ICD-10-CM | POA: Diagnosis not present

## 2020-05-13 DIAGNOSIS — R41841 Cognitive communication deficit: Secondary | ICD-10-CM | POA: Diagnosis not present

## 2020-05-13 DIAGNOSIS — F0390 Unspecified dementia without behavioral disturbance: Secondary | ICD-10-CM | POA: Diagnosis not present

## 2020-05-14 DIAGNOSIS — M6281 Muscle weakness (generalized): Secondary | ICD-10-CM | POA: Diagnosis not present

## 2020-05-14 DIAGNOSIS — F0390 Unspecified dementia without behavioral disturbance: Secondary | ICD-10-CM | POA: Diagnosis not present

## 2020-05-14 DIAGNOSIS — R488 Other symbolic dysfunctions: Secondary | ICD-10-CM | POA: Diagnosis not present

## 2020-05-14 DIAGNOSIS — R41841 Cognitive communication deficit: Secondary | ICD-10-CM | POA: Diagnosis not present

## 2020-05-15 DIAGNOSIS — F0390 Unspecified dementia without behavioral disturbance: Secondary | ICD-10-CM | POA: Diagnosis not present

## 2020-05-15 DIAGNOSIS — M6281 Muscle weakness (generalized): Secondary | ICD-10-CM | POA: Diagnosis not present

## 2020-05-15 DIAGNOSIS — R488 Other symbolic dysfunctions: Secondary | ICD-10-CM | POA: Diagnosis not present

## 2020-05-15 DIAGNOSIS — R41841 Cognitive communication deficit: Secondary | ICD-10-CM | POA: Diagnosis not present

## 2020-05-17 DIAGNOSIS — F0390 Unspecified dementia without behavioral disturbance: Secondary | ICD-10-CM | POA: Diagnosis not present

## 2020-05-17 DIAGNOSIS — M6281 Muscle weakness (generalized): Secondary | ICD-10-CM | POA: Diagnosis not present

## 2020-05-17 DIAGNOSIS — R488 Other symbolic dysfunctions: Secondary | ICD-10-CM | POA: Diagnosis not present

## 2020-05-17 DIAGNOSIS — R41841 Cognitive communication deficit: Secondary | ICD-10-CM | POA: Diagnosis not present

## 2020-05-21 DIAGNOSIS — M6281 Muscle weakness (generalized): Secondary | ICD-10-CM | POA: Diagnosis not present

## 2020-05-21 DIAGNOSIS — R488 Other symbolic dysfunctions: Secondary | ICD-10-CM | POA: Diagnosis not present

## 2020-05-21 DIAGNOSIS — F0390 Unspecified dementia without behavioral disturbance: Secondary | ICD-10-CM | POA: Diagnosis not present

## 2020-05-21 DIAGNOSIS — R41841 Cognitive communication deficit: Secondary | ICD-10-CM | POA: Diagnosis not present

## 2020-05-22 DIAGNOSIS — R41841 Cognitive communication deficit: Secondary | ICD-10-CM | POA: Diagnosis not present

## 2020-05-22 DIAGNOSIS — F0390 Unspecified dementia without behavioral disturbance: Secondary | ICD-10-CM | POA: Diagnosis not present

## 2020-05-22 DIAGNOSIS — M6281 Muscle weakness (generalized): Secondary | ICD-10-CM | POA: Diagnosis not present

## 2020-05-22 DIAGNOSIS — R488 Other symbolic dysfunctions: Secondary | ICD-10-CM | POA: Diagnosis not present

## 2020-05-23 DIAGNOSIS — R488 Other symbolic dysfunctions: Secondary | ICD-10-CM | POA: Diagnosis not present

## 2020-05-23 DIAGNOSIS — M6281 Muscle weakness (generalized): Secondary | ICD-10-CM | POA: Diagnosis not present

## 2020-05-23 DIAGNOSIS — R41841 Cognitive communication deficit: Secondary | ICD-10-CM | POA: Diagnosis not present

## 2020-05-23 DIAGNOSIS — F0390 Unspecified dementia without behavioral disturbance: Secondary | ICD-10-CM | POA: Diagnosis not present

## 2020-05-24 DIAGNOSIS — M6281 Muscle weakness (generalized): Secondary | ICD-10-CM | POA: Diagnosis not present

## 2020-05-24 DIAGNOSIS — R41841 Cognitive communication deficit: Secondary | ICD-10-CM | POA: Diagnosis not present

## 2020-05-24 DIAGNOSIS — R488 Other symbolic dysfunctions: Secondary | ICD-10-CM | POA: Diagnosis not present

## 2020-05-24 DIAGNOSIS — F0390 Unspecified dementia without behavioral disturbance: Secondary | ICD-10-CM | POA: Diagnosis not present

## 2020-05-26 NOTE — Progress Notes (Deleted)
Subjective:    Patient ID: [name], female    DOB: 05-Jan-1931, 87 y.o.   MRN: [id number]  HPI The patient is here for an acute visit.   Back pain -   Medications and allergies reviewed with patient and updated if appropriate.  Patient Active Problem List   Diagnosis Date Noted  . Mild dementia ([place]) 07/27/2017  . Chronic RUQ pain 05/28/2016  . Right knee pain 06/14/2015  . Diabetic neuropathy ([place]) 03/21/2014  . Bunion 03/21/2014  . PVD (pulmonary valve disease) 01/23/2014  . Onychomycosis 12/20/2013  . PVD (peripheral vascular disease) ([place]) 12/19/2013  . Gout 12/13/2013  . Depression   . Spinal stenosis of lumbar region 04/24/2013  . Arthritis of shoulder region, left 09/11/2011  . Type II diabetes mellitus with neurological manifestations ([place]) 01/17/2009  . Anemia 10/15/2008  . CAD, NATIVE VESSEL 06/09/2008  . Hyperlipidemia 06/15/2007  . Essential hypertension 06/15/2007  . GERD 06/15/2007  . Fibromyalgia syndrome 06/15/2007  . 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 DIVERTICULOSIS, 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    Current Outpatient Medications on File Prior to Visit  Medication Sig Dispense Refill  . acetaminophen (TYLENOL) 500 MG tablet Take 1,000 mg by mouth 2 (two) times daily.     Marland Kitchen amLODipine (NORVASC) 5 MG tablet TAKE ONE TABLET BY MOUTH TWICE A DAY 180 tablet 1  . aspirin EC 81 MG tablet Take 81 mg by mouth daily.    Marland Kitchen atorvastatin (LIPITOR) 40 MG tablet Take 1 tablet (40 mg total) by mouth every other day. 140 tablet 3  . Biotin 1 MG CAPS Take 1 capsule by mouth daily.    . busPIRone (BUSPAR) 10 MG tablet Take 1 tablet (10 mg total) by mouth 2 (two) times daily. 60 tablet 11  . carvedilol (COREG) 6.25 MG tablet TAKE ONE TABLET BY MOUTH TWICE A DAY 180 tablet 3  . cetirizine (ZYRTEC) 10 MG tablet TAKE ONE TABLET BY MOUTH DAILY (Patient taking differently: Take 10 mg by mouth at bedtime. ) 90 tablet 3  . Cholecalciferol (D3-1000) 25 MCG (1000 UT)  capsule Take 1,000 Units by mouth daily.    . diclofenac sodium (VOLTAREN) 1 % GEL Apply 2 g topically 4 (four) times daily as needed (pain). 100 g 5  . donepezil (ARICEPT) 10 MG tablet Take 1 tablet daily 90 tablet 3  . DULoxetine (CYMBALTA) 60 MG capsule Take 1 capsule (60 mg total) by mouth daily. 90 capsule 3  . gabapentin (NEURONTIN) 300 MG capsule TAKE ONE CAPSULE BY MOUTH EVERY NIGHT AT BEDTIME 90 capsule 3  . meloxicam (MOBIC) 7.5 MG tablet TAKE ONE TABLET BY MOUTH DAILY 90 tablet 1  . memantine (NAMENDA) 10 MG tablet Take 1 tablet (10 mg total) by mouth 2 (two) times daily. 180 tablet 3  . omeprazole (PRILOSEC) 40 MG capsule TAKE ONE CAPSULE BY MOUTH DAILY 90 capsule 1  . ramipril (ALTACE) 10 MG capsule TAKE ONE CAPSULE BY MOUTH TWICE A DAY 180 capsule 1   No current facility-administered medications on file prior to visit.    Past Medical History:  Diagnosis Date  . CAD, NATIVE VESSEL 06/09/2008  . DIABETES MELLITUS, TYPE II   . FIBROMYALGIA   . GERD   . Gout   . HIATAL HERNIA   . HYPERLIPIDEMIA   . HYPERTENSION   . Insomnia   . OA (osteoarthritis)    severe, shoulders, hands - inflammatory, ?RA  . S/P CABG x 1   . S/P  inguinal hernia repair   . TUBULOVILLOUS ADENOMA, 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   colo 04/2012 - no polyps - no further screening planned (age >22)  . UNSPECIFIED ANEMIA     Past Surgical History:  Procedure Laterality Date  . ABDOMINAL HYSTERECTOMY  1981  . APPENDECTOMY    . CATARACT EXTRACTION, BILATERAL    . CORONARY ARTERY BYPASS GRAFT  2010  . HERNIA REPAIR     INGUINAL  . I & D EXTREMITY Left 12/07/2017   Procedure: IRRIGATION AND DEBRIDEMENT EXTREMITY WITH FOREIGN BODY REMOVAL;  Surgeon: Nicholes Stairs, MD;  Location: [facility];  Service: Orthopedics;  Laterality: Left;  . JOINT REPLACEMENT    . OVARIAN CYST REMOVAL    . REPLACEMENT TOTAL KNEE BILATERAL Bilateral    Rt-1974, Lt -1989  . REVERSE SHOULDER ARTHROPLASTY  09/08/2011   Procedure: REVERSE  SHOULDER ARTHROPLASTY;  Surgeon: Nita Sells, MD;  Location: [facility];  Service: Orthopedics;  Laterality: Left;  left reverse total shoulder arthroplasty  . ROTATOR CUFF REPAIR     Left    Social History   Socioeconomic History  . Marital status: Widowed    Spouse name: Not on file  . Number of children: Not on file  . Years of education: Not on file  . Highest education level: Not on file  Occupational History  . Not on file  Tobacco Use  . Smoking status: Former Smoker    Quit date: 04/26/1970    Years since quitting: 50.1  . Smokeless tobacco: Never Used  Vaping Use  . Vaping Use: Never used  Substance and Sexual Activity  . Alcohol use: Yes    Alcohol/week: 1.0 standard drink    Types: 1 Glasses of wine per week  . Drug use: No  . Sexual activity: Not on file  Other Topics Concern  . Not on file  Social History Narrative   Widows, lives alone. 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 Cancer Mother        Stomach  . Stomach cancer Mother 50  . Hypertension Mother   . Heart disease Father 1       MI  . Diabetes Father   . Heart attack Father   . Diabetes Daughter   . Hypertension Daughter   . Vascular Disease Sister   . Stroke Sister   . Dementia Sister   . Diabetes Sister   . Parkinsonism Sister   . Stroke Sister   . Heart disease Sister   . Heart attack Brother   . Anesthesia problems Neg Hx   . Colon cancer Neg Hx     Review of Systems     Objective:  There were no vitals filed for this visit. BP Readings from Last 3 Encounters:  03/04/20 (!) 150/74  03/02/20 (!) 147/79  02/08/20 136/70   Wt Readings from Last 3  Encounters:  03/04/20 143 lb (64.9 kg)  03/02/20 165 lb (74.8 kg)  02/08/20 153 lb (69.4 kg)   There is no height or weight on file to calculate BMI.   Physical Exam  Assessment & Plan:    See Problem List for Assessment and Plan of chronic medical problems.    This visit occurred during the SARS-CoV-2 public health emergency.  Safety protocols were in place, including screening questions prior to the visit, additional usage of staff PPE, and extensive cleaning of exam room while observing appropriate contact time as indicated for disinfecting solutions.

## 2020-05-27 DIAGNOSIS — R488 Other symbolic dysfunctions: Secondary | ICD-10-CM | POA: Diagnosis not present

## 2020-05-27 DIAGNOSIS — R41841 Cognitive communication deficit: Secondary | ICD-10-CM | POA: Diagnosis not present

## 2020-05-27 DIAGNOSIS — M6281 Muscle weakness (generalized): Secondary | ICD-10-CM | POA: Diagnosis not present

## 2020-05-27 DIAGNOSIS — F0390 Unspecified dementia without behavioral disturbance: Secondary | ICD-10-CM | POA: Diagnosis not present

## 2020-05-27 IMAGING — CR DG LUMBAR SPINE COMPLETE 4+V
5 series · 5 of 5 positions shown · non-contrast
Comparison: 11/18/2018, CT 10/19/2014

CLINICAL DATA: Chronic pain

EXAM:
LUMBAR SPINE - COMPLETE 4+ VIEW

[t lumbar spine ap]
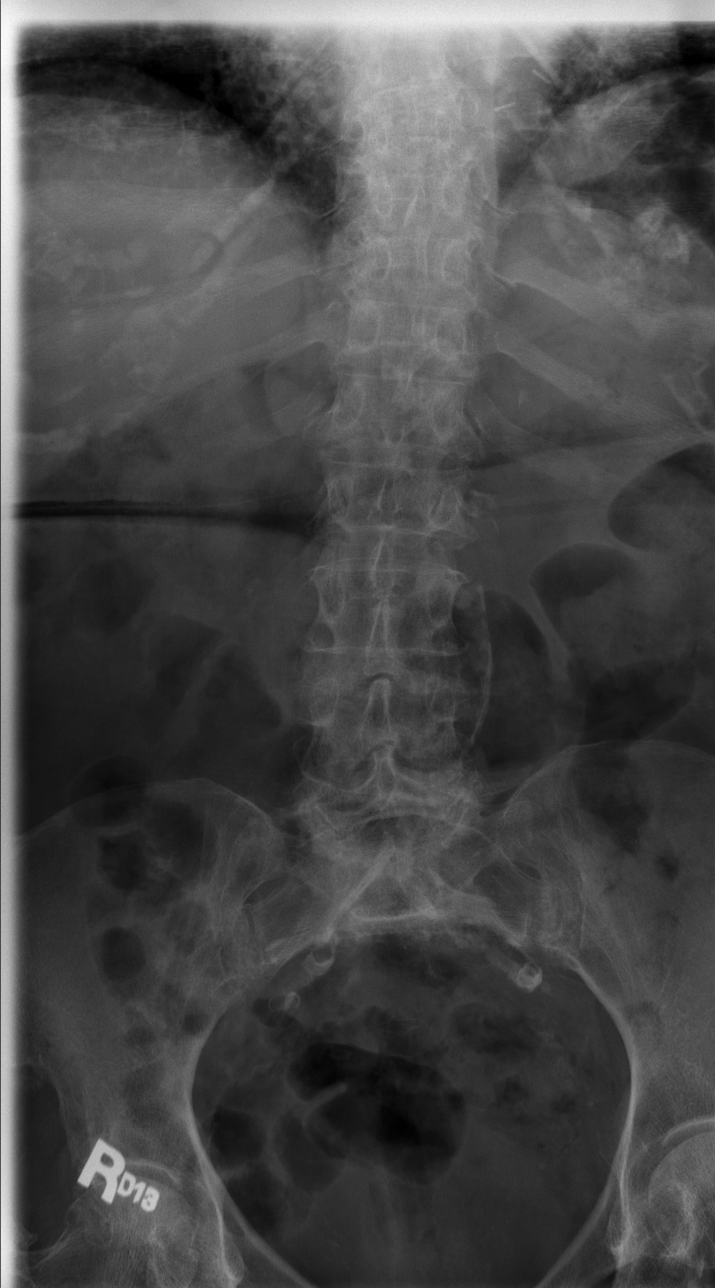

[t lumbar spine obl (1 of 2)]
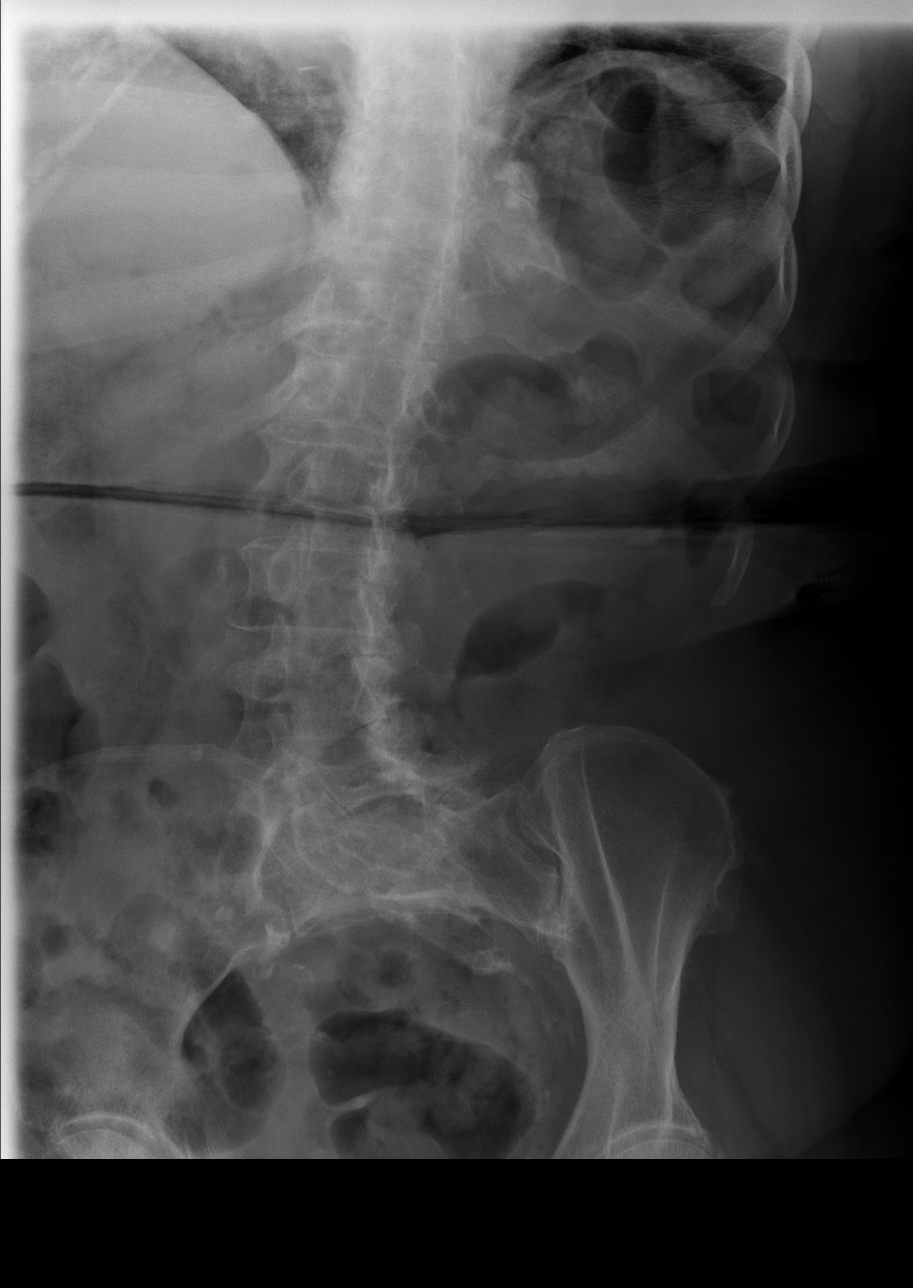

[t lumbar spine obl (2 of 2)]
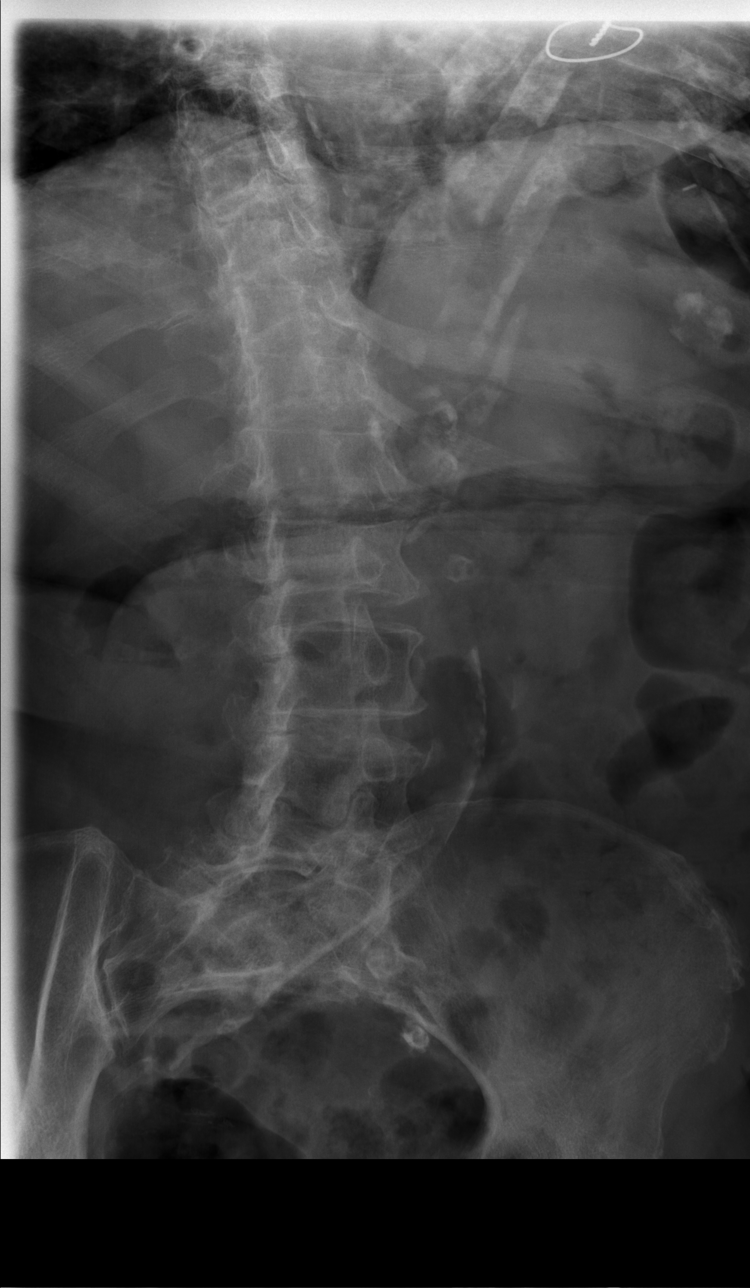

[t lumbar spine lat]
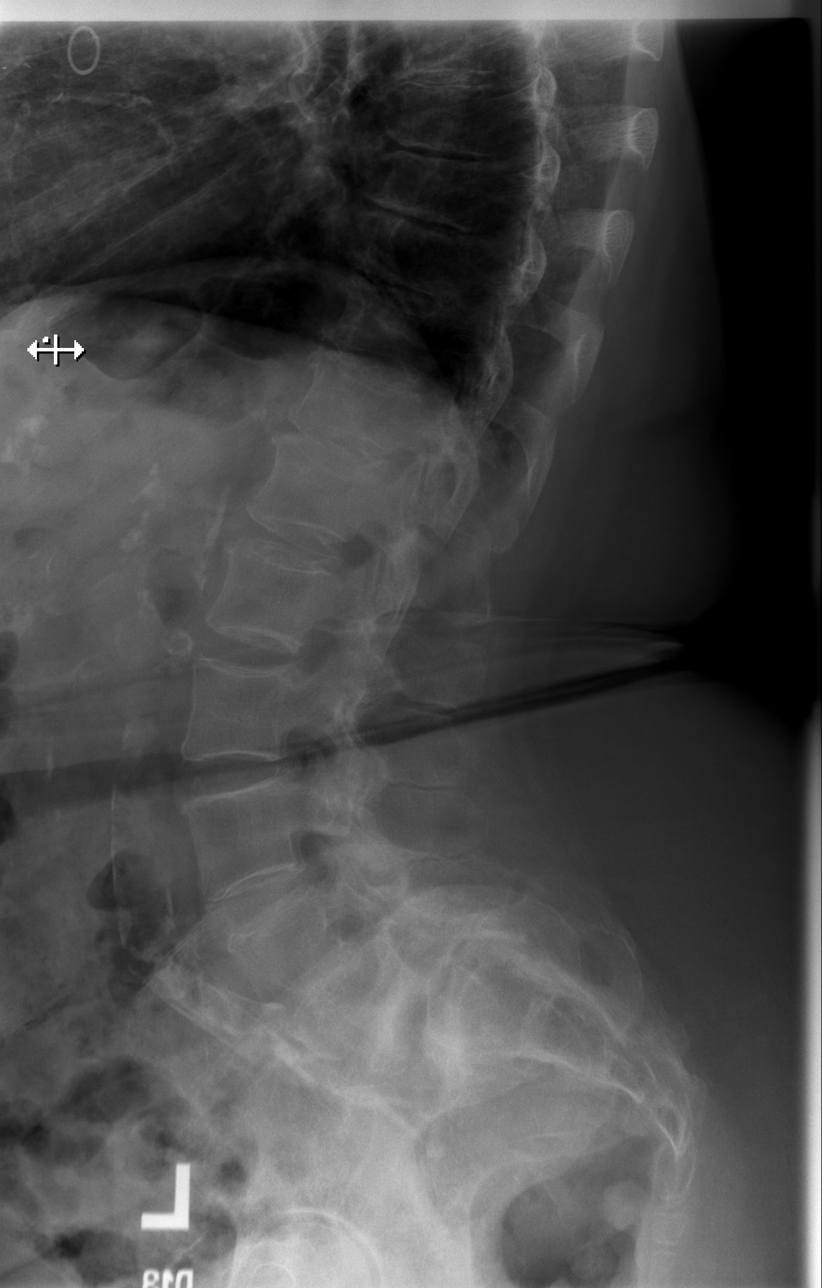

[t lumbar l-5 s-1 spot]
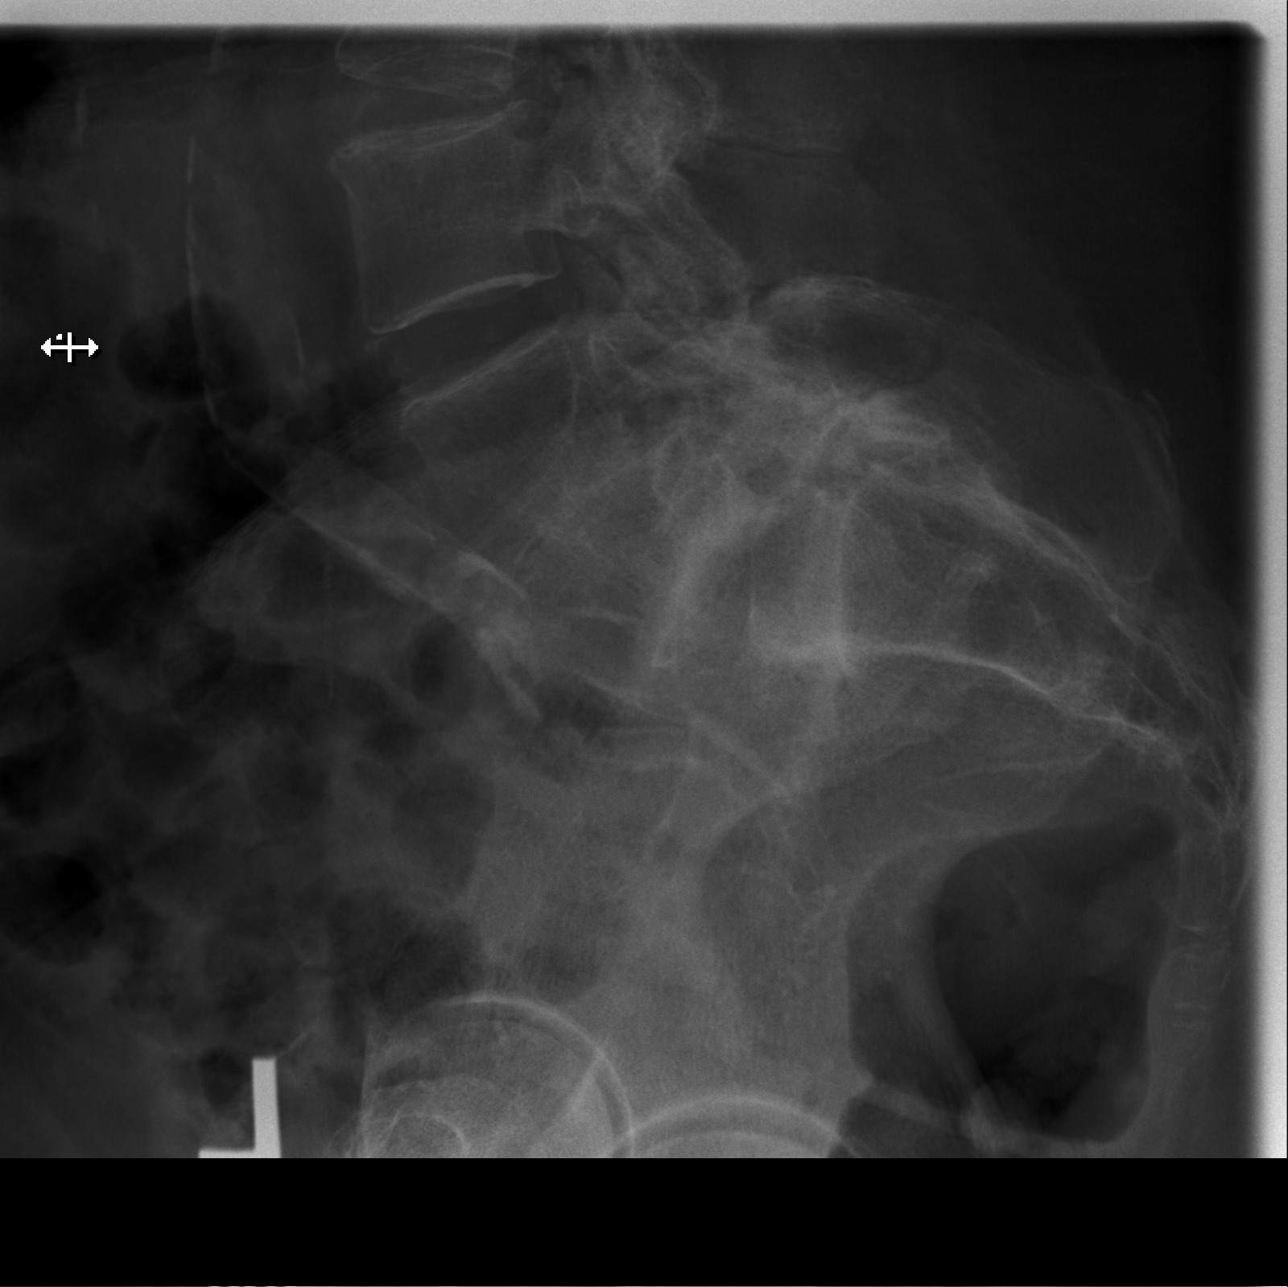

[5 of 5 positions shown; findings below may reference images not displayed]

FINDINGS: Similar numbering scheme as 11/18/2018. 10 mm anterolisthesis L4 on
L5 and 5 mm anterolisthesis L5 on S1. Vertebral body heights are
maintained. History of chronic pars defect at L5. Advanced
degenerative disease at L4-L5. Similar moderate degenerative change
at L5-S1. Aortic atherosclerosis
IMPRESSION: 1. No acute osseous abnormality
2. Similar anterolisthesis L4 on L5 and L5 on S1 with associated
degenerative changes

## 2020-05-28 DIAGNOSIS — F0390 Unspecified dementia without behavioral disturbance: Secondary | ICD-10-CM | POA: Diagnosis not present

## 2020-05-28 DIAGNOSIS — R488 Other symbolic dysfunctions: Secondary | ICD-10-CM | POA: Diagnosis not present

## 2020-05-28 DIAGNOSIS — M6281 Muscle weakness (generalized): Secondary | ICD-10-CM | POA: Diagnosis not present

## 2020-05-28 DIAGNOSIS — R41841 Cognitive communication deficit: Secondary | ICD-10-CM | POA: Diagnosis not present

## 2020-05-29 DIAGNOSIS — F0390 Unspecified dementia without behavioral disturbance: Secondary | ICD-10-CM | POA: Diagnosis not present

## 2020-05-29 DIAGNOSIS — M6281 Muscle weakness (generalized): Secondary | ICD-10-CM | POA: Diagnosis not present

## 2020-05-29 DIAGNOSIS — R41841 Cognitive communication deficit: Secondary | ICD-10-CM | POA: Diagnosis not present

## 2020-05-29 DIAGNOSIS — R488 Other symbolic dysfunctions: Secondary | ICD-10-CM | POA: Diagnosis not present

## 2020-05-30 ENCOUNTER — Ambulatory Visit: Payer: Medicare Other | Admitting: Internal Medicine

## 2020-05-30 DIAGNOSIS — M6281 Muscle weakness (generalized): Secondary | ICD-10-CM | POA: Diagnosis not present

## 2020-05-30 DIAGNOSIS — F0390 Unspecified dementia without behavioral disturbance: Secondary | ICD-10-CM | POA: Diagnosis not present

## 2020-05-30 DIAGNOSIS — R41841 Cognitive communication deficit: Secondary | ICD-10-CM | POA: Diagnosis not present

## 2020-05-30 DIAGNOSIS — R488 Other symbolic dysfunctions: Secondary | ICD-10-CM | POA: Diagnosis not present

## 2020-05-31 DIAGNOSIS — M6281 Muscle weakness (generalized): Secondary | ICD-10-CM | POA: Diagnosis not present

## 2020-05-31 DIAGNOSIS — R488 Other symbolic dysfunctions: Secondary | ICD-10-CM | POA: Diagnosis not present

## 2020-05-31 DIAGNOSIS — R41841 Cognitive communication deficit: Secondary | ICD-10-CM | POA: Diagnosis not present

## 2020-05-31 DIAGNOSIS — F0390 Unspecified dementia without behavioral disturbance: Secondary | ICD-10-CM | POA: Diagnosis not present

## 2020-06-03 DIAGNOSIS — R41841 Cognitive communication deficit: Secondary | ICD-10-CM | POA: Diagnosis not present

## 2020-06-03 DIAGNOSIS — R488 Other symbolic dysfunctions: Secondary | ICD-10-CM | POA: Diagnosis not present

## 2020-06-03 DIAGNOSIS — F0390 Unspecified dementia without behavioral disturbance: Secondary | ICD-10-CM | POA: Diagnosis not present

## 2020-06-03 DIAGNOSIS — M6281 Muscle weakness (generalized): Secondary | ICD-10-CM | POA: Diagnosis not present

## 2020-06-04 DIAGNOSIS — R41841 Cognitive communication deficit: Secondary | ICD-10-CM | POA: Diagnosis not present

## 2020-06-04 DIAGNOSIS — M6281 Muscle weakness (generalized): Secondary | ICD-10-CM | POA: Diagnosis not present

## 2020-06-04 DIAGNOSIS — F0390 Unspecified dementia without behavioral disturbance: Secondary | ICD-10-CM | POA: Diagnosis not present

## 2020-06-04 DIAGNOSIS — R488 Other symbolic dysfunctions: Secondary | ICD-10-CM | POA: Diagnosis not present

## 2020-06-05 ENCOUNTER — Other Ambulatory Visit: Payer: Self-pay | Admitting: Neurology

## 2020-06-05 DIAGNOSIS — R41841 Cognitive communication deficit: Secondary | ICD-10-CM | POA: Diagnosis not present

## 2020-06-05 DIAGNOSIS — F03A0 Unspecified dementia, mild, without behavioral disturbance, psychotic disturbance, mood disturbance, and anxiety: Secondary | ICD-10-CM

## 2020-06-05 DIAGNOSIS — F0390 Unspecified dementia without behavioral disturbance: Secondary | ICD-10-CM | POA: Diagnosis not present

## 2020-06-05 DIAGNOSIS — R488 Other symbolic dysfunctions: Secondary | ICD-10-CM | POA: Diagnosis not present

## 2020-06-05 DIAGNOSIS — M6281 Muscle weakness (generalized): Secondary | ICD-10-CM | POA: Diagnosis not present

## 2020-06-07 DIAGNOSIS — R41841 Cognitive communication deficit: Secondary | ICD-10-CM | POA: Diagnosis not present

## 2020-06-07 DIAGNOSIS — M6281 Muscle weakness (generalized): Secondary | ICD-10-CM | POA: Diagnosis not present

## 2020-06-07 DIAGNOSIS — F0390 Unspecified dementia without behavioral disturbance: Secondary | ICD-10-CM | POA: Diagnosis not present

## 2020-06-07 DIAGNOSIS — R488 Other symbolic dysfunctions: Secondary | ICD-10-CM | POA: Diagnosis not present

## 2020-06-11 DIAGNOSIS — R488 Other symbolic dysfunctions: Secondary | ICD-10-CM | POA: Diagnosis not present

## 2020-06-11 DIAGNOSIS — M6281 Muscle weakness (generalized): Secondary | ICD-10-CM | POA: Diagnosis not present

## 2020-06-11 DIAGNOSIS — R41841 Cognitive communication deficit: Secondary | ICD-10-CM | POA: Diagnosis not present

## 2020-06-11 DIAGNOSIS — F0390 Unspecified dementia without behavioral disturbance: Secondary | ICD-10-CM | POA: Diagnosis not present

## 2020-06-12 DIAGNOSIS — R41841 Cognitive communication deficit: Secondary | ICD-10-CM | POA: Diagnosis not present

## 2020-06-12 DIAGNOSIS — M6281 Muscle weakness (generalized): Secondary | ICD-10-CM | POA: Diagnosis not present

## 2020-06-12 DIAGNOSIS — F0390 Unspecified dementia without behavioral disturbance: Secondary | ICD-10-CM | POA: Diagnosis not present

## 2020-06-12 DIAGNOSIS — R488 Other symbolic dysfunctions: Secondary | ICD-10-CM | POA: Diagnosis not present

## 2020-06-13 DIAGNOSIS — L603 Nail dystrophy: Secondary | ICD-10-CM | POA: Diagnosis not present

## 2020-06-13 DIAGNOSIS — M79671 Pain in right foot: Secondary | ICD-10-CM | POA: Diagnosis not present

## 2020-06-13 DIAGNOSIS — R488 Other symbolic dysfunctions: Secondary | ICD-10-CM | POA: Diagnosis not present

## 2020-06-13 DIAGNOSIS — F0390 Unspecified dementia without behavioral disturbance: Secondary | ICD-10-CM | POA: Diagnosis not present

## 2020-06-13 DIAGNOSIS — R41841 Cognitive communication deficit: Secondary | ICD-10-CM | POA: Diagnosis not present

## 2020-06-13 DIAGNOSIS — E1151 Type 2 diabetes mellitus with diabetic peripheral angiopathy without gangrene: Secondary | ICD-10-CM | POA: Diagnosis not present

## 2020-06-13 DIAGNOSIS — M79672 Pain in left foot: Secondary | ICD-10-CM | POA: Diagnosis not present

## 2020-06-13 DIAGNOSIS — I739 Peripheral vascular disease, unspecified: Secondary | ICD-10-CM | POA: Diagnosis not present

## 2020-06-13 DIAGNOSIS — L84 Corns and callosities: Secondary | ICD-10-CM | POA: Diagnosis not present

## 2020-06-13 DIAGNOSIS — M6281 Muscle weakness (generalized): Secondary | ICD-10-CM | POA: Diagnosis not present

## 2020-06-17 ENCOUNTER — Other Ambulatory Visit: Payer: Self-pay | Admitting: Internal Medicine

## 2020-06-17 DIAGNOSIS — R41841 Cognitive communication deficit: Secondary | ICD-10-CM | POA: Diagnosis not present

## 2020-06-17 DIAGNOSIS — M6281 Muscle weakness (generalized): Secondary | ICD-10-CM | POA: Diagnosis not present

## 2020-06-17 DIAGNOSIS — F0390 Unspecified dementia without behavioral disturbance: Secondary | ICD-10-CM | POA: Diagnosis not present

## 2020-06-17 DIAGNOSIS — R488 Other symbolic dysfunctions: Secondary | ICD-10-CM | POA: Diagnosis not present

## 2020-06-18 DIAGNOSIS — F0391 Unspecified dementia with behavioral disturbance: Secondary | ICD-10-CM | POA: Diagnosis not present

## 2020-06-18 DIAGNOSIS — R41841 Cognitive communication deficit: Secondary | ICD-10-CM | POA: Diagnosis not present

## 2020-06-18 DIAGNOSIS — M6281 Muscle weakness (generalized): Secondary | ICD-10-CM | POA: Diagnosis not present

## 2020-06-18 DIAGNOSIS — R488 Other symbolic dysfunctions: Secondary | ICD-10-CM | POA: Diagnosis not present

## 2020-06-19 DIAGNOSIS — F0391 Unspecified dementia with behavioral disturbance: Secondary | ICD-10-CM | POA: Diagnosis not present

## 2020-06-19 DIAGNOSIS — M6281 Muscle weakness (generalized): Secondary | ICD-10-CM | POA: Diagnosis not present

## 2020-06-19 DIAGNOSIS — R488 Other symbolic dysfunctions: Secondary | ICD-10-CM | POA: Diagnosis not present

## 2020-06-19 DIAGNOSIS — R41841 Cognitive communication deficit: Secondary | ICD-10-CM | POA: Diagnosis not present

## 2020-06-20 DIAGNOSIS — F0391 Unspecified dementia with behavioral disturbance: Secondary | ICD-10-CM | POA: Diagnosis not present

## 2020-06-20 DIAGNOSIS — M6281 Muscle weakness (generalized): Secondary | ICD-10-CM | POA: Diagnosis not present

## 2020-06-20 DIAGNOSIS — R488 Other symbolic dysfunctions: Secondary | ICD-10-CM | POA: Diagnosis not present

## 2020-06-20 DIAGNOSIS — R41841 Cognitive communication deficit: Secondary | ICD-10-CM | POA: Diagnosis not present

## 2020-06-24 DIAGNOSIS — M6281 Muscle weakness (generalized): Secondary | ICD-10-CM | POA: Diagnosis not present

## 2020-06-24 DIAGNOSIS — F0391 Unspecified dementia with behavioral disturbance: Secondary | ICD-10-CM | POA: Diagnosis not present

## 2020-06-24 DIAGNOSIS — R41841 Cognitive communication deficit: Secondary | ICD-10-CM | POA: Diagnosis not present

## 2020-06-24 DIAGNOSIS — R488 Other symbolic dysfunctions: Secondary | ICD-10-CM | POA: Diagnosis not present

## 2020-06-25 DIAGNOSIS — R488 Other symbolic dysfunctions: Secondary | ICD-10-CM | POA: Diagnosis not present

## 2020-06-25 DIAGNOSIS — F0391 Unspecified dementia with behavioral disturbance: Secondary | ICD-10-CM | POA: Diagnosis not present

## 2020-06-25 DIAGNOSIS — R41841 Cognitive communication deficit: Secondary | ICD-10-CM | POA: Diagnosis not present

## 2020-06-25 DIAGNOSIS — M6281 Muscle weakness (generalized): Secondary | ICD-10-CM | POA: Diagnosis not present

## 2020-06-26 DIAGNOSIS — F0391 Unspecified dementia with behavioral disturbance: Secondary | ICD-10-CM | POA: Diagnosis not present

## 2020-06-26 DIAGNOSIS — R41841 Cognitive communication deficit: Secondary | ICD-10-CM | POA: Diagnosis not present

## 2020-06-26 DIAGNOSIS — M6281 Muscle weakness (generalized): Secondary | ICD-10-CM | POA: Diagnosis not present

## 2020-06-26 DIAGNOSIS — R488 Other symbolic dysfunctions: Secondary | ICD-10-CM | POA: Diagnosis not present

## 2020-06-27 DIAGNOSIS — M6281 Muscle weakness (generalized): Secondary | ICD-10-CM | POA: Diagnosis not present

## 2020-06-27 DIAGNOSIS — R488 Other symbolic dysfunctions: Secondary | ICD-10-CM | POA: Diagnosis not present

## 2020-06-27 DIAGNOSIS — F0391 Unspecified dementia with behavioral disturbance: Secondary | ICD-10-CM | POA: Diagnosis not present

## 2020-06-27 DIAGNOSIS — R41841 Cognitive communication deficit: Secondary | ICD-10-CM | POA: Diagnosis not present

## 2020-06-28 DIAGNOSIS — M6281 Muscle weakness (generalized): Secondary | ICD-10-CM | POA: Diagnosis not present

## 2020-06-28 DIAGNOSIS — R488 Other symbolic dysfunctions: Secondary | ICD-10-CM | POA: Diagnosis not present

## 2020-06-28 DIAGNOSIS — R41841 Cognitive communication deficit: Secondary | ICD-10-CM | POA: Diagnosis not present

## 2020-06-28 DIAGNOSIS — F0391 Unspecified dementia with behavioral disturbance: Secondary | ICD-10-CM | POA: Diagnosis not present

## 2020-07-01 DIAGNOSIS — F0391 Unspecified dementia with behavioral disturbance: Secondary | ICD-10-CM | POA: Diagnosis not present

## 2020-07-01 DIAGNOSIS — R41841 Cognitive communication deficit: Secondary | ICD-10-CM | POA: Diagnosis not present

## 2020-07-01 DIAGNOSIS — M6281 Muscle weakness (generalized): Secondary | ICD-10-CM | POA: Diagnosis not present

## 2020-07-01 DIAGNOSIS — R488 Other symbolic dysfunctions: Secondary | ICD-10-CM | POA: Diagnosis not present

## 2020-07-02 DIAGNOSIS — R41841 Cognitive communication deficit: Secondary | ICD-10-CM | POA: Diagnosis not present

## 2020-07-02 DIAGNOSIS — R488 Other symbolic dysfunctions: Secondary | ICD-10-CM | POA: Diagnosis not present

## 2020-07-02 DIAGNOSIS — F0391 Unspecified dementia with behavioral disturbance: Secondary | ICD-10-CM | POA: Diagnosis not present

## 2020-07-02 DIAGNOSIS — M6281 Muscle weakness (generalized): Secondary | ICD-10-CM | POA: Diagnosis not present

## 2020-07-03 DIAGNOSIS — R41841 Cognitive communication deficit: Secondary | ICD-10-CM | POA: Diagnosis not present

## 2020-07-03 DIAGNOSIS — F0391 Unspecified dementia with behavioral disturbance: Secondary | ICD-10-CM | POA: Diagnosis not present

## 2020-07-03 DIAGNOSIS — R488 Other symbolic dysfunctions: Secondary | ICD-10-CM | POA: Diagnosis not present

## 2020-07-03 DIAGNOSIS — M6281 Muscle weakness (generalized): Secondary | ICD-10-CM | POA: Diagnosis not present

## 2020-07-08 DIAGNOSIS — Z20828 Contact with and (suspected) exposure to other viral communicable diseases: Secondary | ICD-10-CM | POA: Diagnosis not present

## 2020-07-08 DIAGNOSIS — F0391 Unspecified dementia with behavioral disturbance: Secondary | ICD-10-CM | POA: Diagnosis not present

## 2020-07-08 DIAGNOSIS — R488 Other symbolic dysfunctions: Secondary | ICD-10-CM | POA: Diagnosis not present

## 2020-07-08 DIAGNOSIS — M6281 Muscle weakness (generalized): Secondary | ICD-10-CM | POA: Diagnosis not present

## 2020-07-08 DIAGNOSIS — R41841 Cognitive communication deficit: Secondary | ICD-10-CM | POA: Diagnosis not present

## 2020-07-09 DIAGNOSIS — R41841 Cognitive communication deficit: Secondary | ICD-10-CM | POA: Diagnosis not present

## 2020-07-09 DIAGNOSIS — M6281 Muscle weakness (generalized): Secondary | ICD-10-CM | POA: Diagnosis not present

## 2020-07-09 DIAGNOSIS — R488 Other symbolic dysfunctions: Secondary | ICD-10-CM | POA: Diagnosis not present

## 2020-07-09 DIAGNOSIS — F0391 Unspecified dementia with behavioral disturbance: Secondary | ICD-10-CM | POA: Diagnosis not present

## 2020-07-11 DIAGNOSIS — R488 Other symbolic dysfunctions: Secondary | ICD-10-CM | POA: Diagnosis not present

## 2020-07-11 DIAGNOSIS — F0391 Unspecified dementia with behavioral disturbance: Secondary | ICD-10-CM | POA: Diagnosis not present

## 2020-07-11 DIAGNOSIS — R41841 Cognitive communication deficit: Secondary | ICD-10-CM | POA: Diagnosis not present

## 2020-07-11 DIAGNOSIS — M6281 Muscle weakness (generalized): Secondary | ICD-10-CM | POA: Diagnosis not present

## 2020-07-12 DIAGNOSIS — F0391 Unspecified dementia with behavioral disturbance: Secondary | ICD-10-CM | POA: Diagnosis not present

## 2020-07-12 DIAGNOSIS — M6281 Muscle weakness (generalized): Secondary | ICD-10-CM | POA: Diagnosis not present

## 2020-07-12 DIAGNOSIS — R488 Other symbolic dysfunctions: Secondary | ICD-10-CM | POA: Diagnosis not present

## 2020-07-12 DIAGNOSIS — R41841 Cognitive communication deficit: Secondary | ICD-10-CM | POA: Diagnosis not present

## 2020-07-15 ENCOUNTER — Telehealth: Payer: Self-pay | Admitting: Internal Medicine

## 2020-07-15 DIAGNOSIS — R488 Other symbolic dysfunctions: Secondary | ICD-10-CM | POA: Diagnosis not present

## 2020-07-15 DIAGNOSIS — F0391 Unspecified dementia with behavioral disturbance: Secondary | ICD-10-CM | POA: Diagnosis not present

## 2020-07-15 DIAGNOSIS — M6281 Muscle weakness (generalized): Secondary | ICD-10-CM | POA: Diagnosis not present

## 2020-07-15 DIAGNOSIS — R41841 Cognitive communication deficit: Secondary | ICD-10-CM | POA: Diagnosis not present

## 2020-07-15 DIAGNOSIS — Z20828 Contact with and (suspected) exposure to other viral communicable diseases: Secondary | ICD-10-CM | POA: Diagnosis not present

## 2020-07-15 NOTE — Telephone Encounter (Signed)
LVM for pt to rtn y call to schedule AWV with [facility]. Please schedule appt if pt calls the office.

## 2020-07-16 ENCOUNTER — Telehealth: Payer: Self-pay | Admitting: Internal Medicine

## 2020-07-16 NOTE — Telephone Encounter (Signed)
LVM for pt to rtn my call to schedule AWV with [facility]. Please schedule appt if pt calls the office.  

## 2020-07-17 DIAGNOSIS — M6281 Muscle weakness (generalized): Secondary | ICD-10-CM | POA: Diagnosis not present

## 2020-07-17 DIAGNOSIS — F0391 Unspecified dementia with behavioral disturbance: Secondary | ICD-10-CM | POA: Diagnosis not present

## 2020-07-17 DIAGNOSIS — R41841 Cognitive communication deficit: Secondary | ICD-10-CM | POA: Diagnosis not present

## 2020-07-17 DIAGNOSIS — R488 Other symbolic dysfunctions: Secondary | ICD-10-CM | POA: Diagnosis not present

## 2020-07-22 DIAGNOSIS — Z20828 Contact with and (suspected) exposure to other viral communicable diseases: Secondary | ICD-10-CM | POA: Diagnosis not present

## 2020-07-22 DIAGNOSIS — R488 Other symbolic dysfunctions: Secondary | ICD-10-CM | POA: Diagnosis not present

## 2020-07-22 DIAGNOSIS — F0391 Unspecified dementia with behavioral disturbance: Secondary | ICD-10-CM | POA: Diagnosis not present

## 2020-07-22 DIAGNOSIS — M6281 Muscle weakness (generalized): Secondary | ICD-10-CM | POA: Diagnosis not present

## 2020-07-24 DIAGNOSIS — F0391 Unspecified dementia with behavioral disturbance: Secondary | ICD-10-CM | POA: Diagnosis not present

## 2020-07-24 DIAGNOSIS — M6281 Muscle weakness (generalized): Secondary | ICD-10-CM | POA: Diagnosis not present

## 2020-07-24 DIAGNOSIS — R488 Other symbolic dysfunctions: Secondary | ICD-10-CM | POA: Diagnosis not present

## 2020-07-25 DIAGNOSIS — M6281 Muscle weakness (generalized): Secondary | ICD-10-CM | POA: Diagnosis not present

## 2020-07-25 DIAGNOSIS — F0391 Unspecified dementia with behavioral disturbance: Secondary | ICD-10-CM | POA: Diagnosis not present

## 2020-07-25 DIAGNOSIS — R488 Other symbolic dysfunctions: Secondary | ICD-10-CM | POA: Diagnosis not present

## 2020-07-29 DIAGNOSIS — Z20828 Contact with and (suspected) exposure to other viral communicable diseases: Secondary | ICD-10-CM | POA: Diagnosis not present

## 2020-07-30 DIAGNOSIS — R488 Other symbolic dysfunctions: Secondary | ICD-10-CM | POA: Diagnosis not present

## 2020-07-30 DIAGNOSIS — M6281 Muscle weakness (generalized): Secondary | ICD-10-CM | POA: Diagnosis not present

## 2020-07-30 DIAGNOSIS — F0391 Unspecified dementia with behavioral disturbance: Secondary | ICD-10-CM | POA: Diagnosis not present

## 2020-07-31 DIAGNOSIS — F0391 Unspecified dementia with behavioral disturbance: Secondary | ICD-10-CM | POA: Diagnosis not present

## 2020-07-31 DIAGNOSIS — R488 Other symbolic dysfunctions: Secondary | ICD-10-CM | POA: Diagnosis not present

## 2020-07-31 DIAGNOSIS — M6281 Muscle weakness (generalized): Secondary | ICD-10-CM | POA: Diagnosis not present

## 2020-08-01 DIAGNOSIS — R488 Other symbolic dysfunctions: Secondary | ICD-10-CM | POA: Diagnosis not present

## 2020-08-01 DIAGNOSIS — M6281 Muscle weakness (generalized): Secondary | ICD-10-CM | POA: Diagnosis not present

## 2020-08-01 DIAGNOSIS — F0391 Unspecified dementia with behavioral disturbance: Secondary | ICD-10-CM | POA: Diagnosis not present

## 2020-08-05 DIAGNOSIS — F0391 Unspecified dementia with behavioral disturbance: Secondary | ICD-10-CM | POA: Diagnosis not present

## 2020-08-05 DIAGNOSIS — R488 Other symbolic dysfunctions: Secondary | ICD-10-CM | POA: Diagnosis not present

## 2020-08-05 DIAGNOSIS — Z20828 Contact with and (suspected) exposure to other viral communicable diseases: Secondary | ICD-10-CM | POA: Diagnosis not present

## 2020-08-05 DIAGNOSIS — M6281 Muscle weakness (generalized): Secondary | ICD-10-CM | POA: Diagnosis not present

## 2020-08-07 DIAGNOSIS — I700 Atherosclerosis of aorta: Secondary | ICD-10-CM | POA: Insufficient documentation

## 2020-08-07 NOTE — Progress Notes (Signed)
Subjective:    Patient ID: [name], female    DOB: [birth year], 87 y.o.   MRN: [id number]  HPI The patient is here for follow up of their chronic medical problems, including htn, hyperlipidemia, DM, gerd, depression, dementia, chronic back pain   She walks the halls.     She has chronic back pain.  She is doing PT. OT.     Medications and allergies reviewed with patient and updated if appropriate.  Patient Active Problem List   Diagnosis Date Noted  . Aortic atherosclerosis ([place]) 08/07/2020  . Mild dementia ([place]) 07/27/2017  . Chronic RUQ pain 05/28/2016  . Right knee pain 06/14/2015  . Diabetic neuropathy ([place]) 03/21/2014  . Bunion 03/21/2014  . PVD (pulmonary valve disease) 01/23/2014  . Onychomycosis 12/20/2013  . PVD (peripheral vascular disease) ([place]) 12/19/2013  . Gout 12/13/2013  . Depression   . Spinal stenosis of lumbar region 04/24/2013  . Arthritis of shoulder region, left 09/11/2011  . Type II diabetes mellitus with neurological manifestations ([place]) 01/17/2009  . Anemia 10/15/2008  . CAD, NATIVE VESSEL 06/09/2008  . Hyperlipidemia 06/15/2007  . Essential hypertension 06/15/2007  . GERD 06/15/2007  . 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 DIVERTICULOSIS, 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    Current Outpatient Medications on File Prior to Visit  Medication Sig Dispense Refill  . acetaminophen (TYLENOL) 500 MG tablet Take 1,000 mg by mouth 2 (two) times daily.     Marland Kitchen amLODipine (NORVASC) 5 MG tablet TAKE ONE TABLET BY MOUTH TWICE A DAY 180 tablet 1  . aspirin EC 81 MG tablet Take 81 mg by mouth daily.    Marland Kitchen atorvastatin (LIPITOR) 40 MG tablet Take 1 tablet (40 mg total) by mouth every other day. 140 tablet 3  . Biotin 1 MG CAPS Take 1 capsule by mouth daily.    . busPIRone (BUSPAR) 10 MG tablet Take 1 tablet (10 mg total) by mouth 2 (two) times daily. 60 tablet 11  . carvedilol (COREG) 6.25 MG tablet TAKE ONE TABLET BY MOUTH TWICE A DAY 180 tablet 3  .  cetirizine (ZYRTEC) 10 MG tablet TAKE ONE TABLET BY MOUTH DAILY (Patient taking differently: Take 10 mg by mouth at bedtime.) 90 tablet 3  . Cholecalciferol (D3-1000) 25 MCG (1000 UT) capsule Take 1,000 Units by mouth daily.    . diclofenac sodium (VOLTAREN) 1 % GEL Apply 2 g topically 4 (four) times daily as needed (pain). 100 g 5  . donepezil (ARICEPT) 10 MG tablet Take 1 tablet daily 90 tablet 3  . DULoxetine (CYMBALTA) 60 MG capsule Take 1 capsule (60 mg total) by mouth daily. 90 capsule 3  . gabapentin (NEURONTIN) 300 MG capsule TAKE ONE CAPSULE BY MOUTH EVERY NIGHT AT BEDTIME 90 capsule 3  . meloxicam (MOBIC) 7.5 MG tablet TAKE ONE TABLET BY MOUTH DAILY 90 tablet 1  . memantine (NAMENDA) 10 MG tablet Take 1 tablet (10 mg total) by mouth 2 (two) times daily. 180 tablet 3  . omeprazole (PRILOSEC) 40 MG capsule TAKE ONE CAPSULE BY MOUTH DAILY 90 capsule 1  . ramipril (ALTACE) 10 MG capsule TAKE ONE CAPSULE BY MOUTH TWICE A DAY 180 capsule 1   No current facility-administered medications on file prior to visit.    Past Medical History:  Diagnosis Date  . CAD, NATIVE VESSEL 06/09/2008  . DIABETES MELLITUS, TYPE II   . FIBROMYALGIA   . GERD   . Gout   . HIATAL HERNIA   . HYPERLIPIDEMIA   .  HYPERTENSION   . Insomnia   . OA (osteoarthritis)    severe, shoulders, hands - inflammatory, ?RA  . S/P CABG x 1   . S/P inguinal hernia repair   . TUBULOVILLOUS ADENOMA, 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   colo 04/2012 - no polyps - no further screening planned (age >40)  . UNSPECIFIED ANEMIA     Past Surgical History:  Procedure Laterality Date  . ABDOMINAL HYSTERECTOMY  1981  . APPENDECTOMY    . CATARACT EXTRACTION, BILATERAL    . CORONARY ARTERY BYPASS GRAFT  2010  . HERNIA REPAIR     INGUINAL  . I & D EXTREMITY Left 12/07/2017   Procedure: IRRIGATION AND DEBRIDEMENT EXTREMITY WITH FOREIGN BODY REMOVAL;  Surgeon: Nicholes Stairs, MD;  Location: [facility];  Service: Orthopedics;  Laterality: Left;  .  JOINT REPLACEMENT    . OVARIAN CYST REMOVAL    . REPLACEMENT TOTAL KNEE BILATERAL Bilateral    Rt-1974, Lt -1989  . REVERSE SHOULDER ARTHROPLASTY  09/08/2011   Procedure: REVERSE SHOULDER ARTHROPLASTY;  Surgeon: Nita Sells, MD;  Location: [facility];  Service: Orthopedics;  Laterality: Left;  left reverse total shoulder arthroplasty  . ROTATOR CUFF REPAIR     Left    Social History   Socioeconomic History  . Marital status: Widowed    Spouse name: Not on file  . Number of children: Not on file  . Years of education: Not on file  . Highest education level: Not on file  Occupational History  . Not on file  Tobacco Use  . Smoking status: Former Smoker    Quit date: 04/26/1970    Years since quitting: 50.3  . Smokeless tobacco: Never Used  Vaping Use  . Vaping Use: Never used  Substance and Sexual Activity  . Alcohol use: Yes    Alcohol/week: 1.0 standard drink    Types: 1 Glasses of wine per week  . Drug use: No  . Sexual activity: Not on file  Other Topics Concern  . Not on file  Social History Narrative   Widows, lives alone. 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 Cancer Mother        Stomach  . Stomach cancer Mother 74  . Hypertension Mother   . Heart disease Father 16       MI  . Diabetes Father   . Heart attack Father   . Diabetes Daughter   . Hypertension Daughter   . Vascular Disease Sister   . Stroke Sister   . Dementia Sister   . Diabetes Sister   . Parkinsonism Sister   . Stroke Sister   . Heart disease Sister   . Heart attack Brother   . Anesthesia problems Neg Hx   . Colon cancer Neg Hx     Review of Systems   Constitutional: Negative for fever.  Respiratory: Negative for cough, shortness of breath and wheezing.   Cardiovascular: Negative for chest pain, palpitations and leg swelling.  Gastrointestinal: Negative for abdominal pain, constipation and diarrhea.  Neurological: Negative for light-headedness and headaches.       Objective:   Vitals:  08/08/20 1028  BP: 132/68  Pulse: 60  Temp: 98.1 F (36.7 C)  SpO2: 95%   BP Readings from Last 3 Encounters:  08/08/20 132/68  03/04/20 (!) 150/74  03/02/20 (!) 147/79   Wt Readings from Last 3 Encounters:  08/08/20 153 lb (69.4 kg)  03/04/20 143 lb (64.9 kg)  03/02/20 165 lb (74.8 kg)   Body mass index is 30.9 kg/m.   Physical Exam    Constitutional: Appears well-developed and well-nourished. No distress.  HENT:  Head: Normocephalic and atraumatic.  Neck: Neck supple. No tracheal deviation present. No thyromegaly present.  No cervical lymphadenopathy Cardiovascular: Normal rate, regular rhythm and normal heart sounds.   2/6 sys murmur heard. No carotid bruit .  No edema Pulmonary/Chest: Effort normal and breath sounds normal. No respiratory distress. No has no wheezes. No rales.  Skin: Skin is warm and dry. Not diaphoretic.  Psychiatric: Normal mood and affect. Behavior is normal.      Assessment & Plan:    See Problem List for Assessment and Plan of chronic medical problems.    This visit occurred during the SARS-CoV-2 public health emergency.  Safety protocols were in place, including screening questions prior to the visit, additional usage of staff PPE, and extensive cleaning of exam room while observing appropriate contact time as indicated for disinfecting solutions.

## 2020-08-07 NOTE — Patient Instructions (Addendum)
    Blood work was ordered.     Medications changes include :  none   Your prescription(s) have been submitted to your pharmacy. Please take as directed and contact our office if you believe you are having problem(s) with the medication(s).   Please followup in 6 months  

## 2020-08-08 ENCOUNTER — Ambulatory Visit (INDEPENDENT_AMBULATORY_CARE_PROVIDER_SITE_OTHER): Payer: Medicare Other | Admitting: Internal Medicine

## 2020-08-08 ENCOUNTER — Encounter: Payer: Self-pay | Admitting: Internal Medicine

## 2020-08-08 ENCOUNTER — Other Ambulatory Visit: Payer: Self-pay

## 2020-08-08 VITALS — BP 132/68 | HR 60 | Temp 98.1°F | Ht 59.0 in | Wt 153.0 lb

## 2020-08-08 DIAGNOSIS — D509 Iron deficiency anemia, unspecified: Secondary | ICD-10-CM | POA: Diagnosis not present

## 2020-08-08 DIAGNOSIS — K219 Gastro-esophageal reflux disease without esophagitis: Secondary | ICD-10-CM

## 2020-08-08 DIAGNOSIS — I10 Essential (primary) hypertension: Secondary | ICD-10-CM | POA: Diagnosis not present

## 2020-08-08 DIAGNOSIS — F3289 Other specified depressive episodes: Secondary | ICD-10-CM | POA: Diagnosis not present

## 2020-08-08 DIAGNOSIS — I700 Atherosclerosis of aorta: Secondary | ICD-10-CM

## 2020-08-08 DIAGNOSIS — E1149 Type 2 diabetes mellitus with other diabetic neurological complication: Secondary | ICD-10-CM

## 2020-08-08 DIAGNOSIS — E782 Mixed hyperlipidemia: Secondary | ICD-10-CM | POA: Diagnosis not present

## 2020-08-08 DIAGNOSIS — M48061 Spinal stenosis, lumbar region without neurogenic claudication: Secondary | ICD-10-CM

## 2020-08-08 DIAGNOSIS — E0841 Diabetes mellitus due to underlying condition with diabetic mononeuropathy: Secondary | ICD-10-CM

## 2020-08-08 DIAGNOSIS — F0390 Unspecified dementia without behavioral disturbance: Secondary | ICD-10-CM

## 2020-08-08 DIAGNOSIS — I25810 Atherosclerosis of coronary artery bypass graft(s) without angina pectoris: Secondary | ICD-10-CM | POA: Diagnosis not present

## 2020-08-08 DIAGNOSIS — F03A0 Unspecified dementia, mild, without behavioral disturbance, psychotic disturbance, mood disturbance, and anxiety: Secondary | ICD-10-CM

## 2020-08-08 LAB — CBC WITH DIFFERENTIAL/PLATELET
Basophils Absolute: 0 10*3/uL (ref 0.0–0.1)
Basophils Relative: 0.6 % (ref 0.0–3.0)
Eosinophils Absolute: 0.2 10*3/uL (ref 0.0–0.7)
Eosinophils Relative: 3.7 % (ref 0.0–5.0)
HCT: 31.2 % — ABNORMAL LOW (ref 36.0–46.0)
Hemoglobin: 9.8 g/dL — ABNORMAL LOW (ref 12.0–15.0)
Lymphocytes Relative: 30.1 % (ref 12.0–46.0)
Lymphs Abs: 1.8 10*3/uL (ref 0.7–4.0)
MCHC: 31.3 g/dL (ref 30.0–36.0)
MCV: 83.2 fl (ref 78.0–100.0)
Monocytes Absolute: 0.4 10*3/uL (ref 0.1–1.0)
Monocytes Relative: 7.5 % (ref 3.0–12.0)
Neutro Abs: 3.4 10*3/uL (ref 1.4–7.7)
Neutrophils Relative %: 58.1 % (ref 43.0–77.0)
Platelets: 211 10*3/uL (ref 150.0–400.0)
RBC: 3.75 Mil/uL — ABNORMAL LOW (ref 3.87–5.11)
RDW: 16.3 % — ABNORMAL HIGH (ref 11.5–15.5)
WBC: 5.8 10*3/uL (ref 4.0–10.5)

## 2020-08-08 LAB — COMPREHENSIVE METABOLIC PANEL
ALT: 13 U/L (ref 0–35)
AST: 21 U/L (ref 0–37)
Albumin: 4 g/dL (ref 3.5–5.2)
Alkaline Phosphatase: 83 U/L (ref 39–117)
BUN: 19 mg/dL (ref 6–23)
CO2: 29 mEq/L (ref 19–32)
Calcium: 9.4 mg/dL (ref 8.4–10.5)
Chloride: 103 mEq/L (ref 96–112)
Creatinine, Ser: 1 mg/dL (ref 0.40–1.20)
GFR: 50.42 mL/min — ABNORMAL LOW (ref >60.00)
Glucose, Bld: 101 mg/dL — ABNORMAL HIGH (ref 70–99)
Potassium: 4.9 mEq/L (ref 3.5–5.1)
Sodium: 138 mEq/L (ref 135–145)
Total Bilirubin: 0.6 mg/dL (ref 0.2–1.2)
Total Protein: 6.8 g/dL (ref 6.0–8.3)

## 2020-08-08 LAB — HEMOGLOBIN A1C: Hgb A1c MFr Bld: 6.3 % (ref 4.6–6.5)

## 2020-08-08 MED ORDER — OMEPRAZOLE 40 MG PO CPDR
DELAYED_RELEASE_CAPSULE | ORAL | 3 refills | Status: DC
Start: 2020-08-08 — End: 2021-08-19

## 2020-08-08 MED ORDER — RAMIPRIL 10 MG PO CAPS
10.0000 mg | ORAL_CAPSULE | Freq: Two times a day (BID) | ORAL | 3 refills | Status: DC
Start: 2020-08-08 — End: 2021-08-06

## 2020-08-08 MED ORDER — MELOXICAM 7.5 MG PO TABS
7.5000 mg | ORAL_TABLET | Freq: Every day | ORAL | 3 refills | Status: DC
Start: 2020-08-08 — End: 2020-12-26

## 2020-08-08 MED ORDER — GABAPENTIN 300 MG PO CAPS
1.0000 | ORAL_CAPSULE | Freq: Every day | ORAL | 3 refills | Status: DC
Start: 2020-08-08 — End: 2021-09-02

## 2020-08-08 MED ORDER — AMLODIPINE BESYLATE 5 MG PO TABS: 1.0000 | ORAL_TABLET | Freq: Two times a day (BID) | ORAL | 3 refills | Status: DC

## 2020-08-08 NOTE — Assessment & Plan Note (Signed)
Chronic Intermittent pain Fairly controlled Continue meloxicam 7.5 mg daily and voltaren gel daily

## 2020-08-08 NOTE — Assessment & Plan Note (Signed)
Chronic Check cbc, iron panel

## 2020-08-08 NOTE — Assessment & Plan Note (Signed)
Chronic BP well controlled Continue amlodipine 5 mg bid, coreg 6.25 mg bid, ramipril 10 mg bid cmp

## 2020-08-08 NOTE — Assessment & Plan Note (Signed)
Chronic Lab Results  Component Value Date   HGBA1C 6.2 (H) 12/28/2019    Diet controlled Check a1c today

## 2020-08-08 NOTE — Assessment & Plan Note (Signed)
Chronic Per Dr Delice Lesch On aricept, namenda and buspar

## 2020-08-08 NOTE — Assessment & Plan Note (Addendum)
Chronic Controlled, stable - PHA9 score 0 Continue cymbalta 60 mg daily

## 2020-08-08 NOTE — Assessment & Plan Note (Addendum)
Chronic atorvastatin 40 mg daily stopped by cardio due to concerns over side effects She is eating fairly healthy and is as active as she can be for her age and back pain

## 2020-08-08 NOTE — Assessment & Plan Note (Signed)
Chronic Sugars well controlled Neuropathy controlled Continue gabapentin 300 mg HS, cymbalta 60 mg daily

## 2020-08-08 NOTE — Assessment & Plan Note (Addendum)
Chronic GERD controlled Continue omeprazole 40 mg qd

## 2020-08-08 NOTE — Assessment & Plan Note (Addendum)
Chronic Off atorvastatin 40 mg daily - stopped due to possible side effects Regular exercise and healthy diet encouraged

## 2020-08-09 LAB — IRON,TIBC AND FERRITIN PANEL
%SAT: 9 % (calc) — ABNORMAL LOW (ref 16–45)
Ferritin: 19 ng/mL (ref 16–288)
Iron: 39 ug/dL — ABNORMAL LOW (ref 45–160)
TIBC: 437 mcg/dL (calc) (ref 250–450)

## 2020-08-10 NOTE — Addendum Note (Signed)
Addended by: Binnie Rail on: 08/10/2020 02:47 PM   Modules accepted: Orders

## 2020-08-12 ENCOUNTER — Telehealth: Payer: Self-pay | Admitting: Internal Medicine

## 2020-08-12 DIAGNOSIS — F0390 Unspecified dementia without behavioral disturbance: Secondary | ICD-10-CM | POA: Diagnosis not present

## 2020-08-12 DIAGNOSIS — R262 Difficulty in walking, not elsewhere classified: Secondary | ICD-10-CM | POA: Diagnosis not present

## 2020-08-12 DIAGNOSIS — M6281 Muscle weakness (generalized): Secondary | ICD-10-CM | POA: Diagnosis not present

## 2020-08-12 DIAGNOSIS — Z20828 Contact with and (suspected) exposure to other viral communicable diseases: Secondary | ICD-10-CM | POA: Diagnosis not present

## 2020-08-12 NOTE — Telephone Encounter (Signed)
Patients daughter called back in regards to lab results

## 2020-08-13 ENCOUNTER — Other Ambulatory Visit: Payer: Self-pay | Admitting: Internal Medicine

## 2020-08-13 DIAGNOSIS — F0390 Unspecified dementia without behavioral disturbance: Secondary | ICD-10-CM | POA: Diagnosis not present

## 2020-08-13 DIAGNOSIS — R262 Difficulty in walking, not elsewhere classified: Secondary | ICD-10-CM | POA: Diagnosis not present

## 2020-08-13 DIAGNOSIS — M6281 Muscle weakness (generalized): Secondary | ICD-10-CM | POA: Diagnosis not present

## 2020-08-13 NOTE — Telephone Encounter (Signed)
Spoke with patient's daughter today and results given.

## 2020-08-14 DIAGNOSIS — R262 Difficulty in walking, not elsewhere classified: Secondary | ICD-10-CM | POA: Diagnosis not present

## 2020-08-14 DIAGNOSIS — M6281 Muscle weakness (generalized): Secondary | ICD-10-CM | POA: Diagnosis not present

## 2020-08-14 DIAGNOSIS — F0390 Unspecified dementia without behavioral disturbance: Secondary | ICD-10-CM | POA: Diagnosis not present

## 2020-08-19 DIAGNOSIS — Z20828 Contact with and (suspected) exposure to other viral communicable diseases: Secondary | ICD-10-CM | POA: Diagnosis not present

## 2020-08-20 DIAGNOSIS — F0390 Unspecified dementia without behavioral disturbance: Secondary | ICD-10-CM | POA: Diagnosis not present

## 2020-08-20 DIAGNOSIS — R262 Difficulty in walking, not elsewhere classified: Secondary | ICD-10-CM | POA: Diagnosis not present

## 2020-08-20 DIAGNOSIS — M6281 Muscle weakness (generalized): Secondary | ICD-10-CM | POA: Diagnosis not present

## 2020-08-22 DIAGNOSIS — R262 Difficulty in walking, not elsewhere classified: Secondary | ICD-10-CM | POA: Diagnosis not present

## 2020-08-22 DIAGNOSIS — M6281 Muscle weakness (generalized): Secondary | ICD-10-CM | POA: Diagnosis not present

## 2020-08-22 DIAGNOSIS — F0390 Unspecified dementia without behavioral disturbance: Secondary | ICD-10-CM | POA: Diagnosis not present

## 2020-08-23 DIAGNOSIS — R262 Difficulty in walking, not elsewhere classified: Secondary | ICD-10-CM | POA: Diagnosis not present

## 2020-08-23 DIAGNOSIS — F0390 Unspecified dementia without behavioral disturbance: Secondary | ICD-10-CM | POA: Diagnosis not present

## 2020-08-23 DIAGNOSIS — M6281 Muscle weakness (generalized): Secondary | ICD-10-CM | POA: Diagnosis not present

## 2020-08-26 DIAGNOSIS — R262 Difficulty in walking, not elsewhere classified: Secondary | ICD-10-CM | POA: Diagnosis not present

## 2020-08-26 DIAGNOSIS — Z20828 Contact with and (suspected) exposure to other viral communicable diseases: Secondary | ICD-10-CM | POA: Diagnosis not present

## 2020-08-26 DIAGNOSIS — F0390 Unspecified dementia without behavioral disturbance: Secondary | ICD-10-CM | POA: Diagnosis not present

## 2020-08-26 DIAGNOSIS — M6281 Muscle weakness (generalized): Secondary | ICD-10-CM | POA: Diagnosis not present

## 2020-08-29 ENCOUNTER — Ambulatory Visit: Payer: No Typology Code available for payment source | Admitting: Neurology

## 2020-08-29 DIAGNOSIS — M6281 Muscle weakness (generalized): Secondary | ICD-10-CM | POA: Diagnosis not present

## 2020-08-29 DIAGNOSIS — R262 Difficulty in walking, not elsewhere classified: Secondary | ICD-10-CM | POA: Diagnosis not present

## 2020-08-29 DIAGNOSIS — F0390 Unspecified dementia without behavioral disturbance: Secondary | ICD-10-CM | POA: Diagnosis not present

## 2020-08-30 DIAGNOSIS — M6281 Muscle weakness (generalized): Secondary | ICD-10-CM | POA: Diagnosis not present

## 2020-08-30 DIAGNOSIS — F0390 Unspecified dementia without behavioral disturbance: Secondary | ICD-10-CM | POA: Diagnosis not present

## 2020-08-30 DIAGNOSIS — R262 Difficulty in walking, not elsewhere classified: Secondary | ICD-10-CM | POA: Diagnosis not present

## 2020-09-02 DIAGNOSIS — M6281 Muscle weakness (generalized): Secondary | ICD-10-CM | POA: Diagnosis not present

## 2020-09-02 DIAGNOSIS — R262 Difficulty in walking, not elsewhere classified: Secondary | ICD-10-CM | POA: Diagnosis not present

## 2020-09-02 DIAGNOSIS — F0390 Unspecified dementia without behavioral disturbance: Secondary | ICD-10-CM | POA: Diagnosis not present

## 2020-09-02 DIAGNOSIS — Z20828 Contact with and (suspected) exposure to other viral communicable diseases: Secondary | ICD-10-CM | POA: Diagnosis not present

## 2020-09-04 DIAGNOSIS — M6281 Muscle weakness (generalized): Secondary | ICD-10-CM | POA: Diagnosis not present

## 2020-09-04 DIAGNOSIS — F0390 Unspecified dementia without behavioral disturbance: Secondary | ICD-10-CM | POA: Diagnosis not present

## 2020-09-04 DIAGNOSIS — R262 Difficulty in walking, not elsewhere classified: Secondary | ICD-10-CM | POA: Diagnosis not present

## 2020-09-06 DIAGNOSIS — F0390 Unspecified dementia without behavioral disturbance: Secondary | ICD-10-CM | POA: Diagnosis not present

## 2020-09-06 DIAGNOSIS — R262 Difficulty in walking, not elsewhere classified: Secondary | ICD-10-CM | POA: Diagnosis not present

## 2020-09-06 DIAGNOSIS — M6281 Muscle weakness (generalized): Secondary | ICD-10-CM | POA: Diagnosis not present

## 2020-09-09 DIAGNOSIS — Z20828 Contact with and (suspected) exposure to other viral communicable diseases: Secondary | ICD-10-CM | POA: Diagnosis not present

## 2020-09-10 DIAGNOSIS — F0390 Unspecified dementia without behavioral disturbance: Secondary | ICD-10-CM | POA: Diagnosis not present

## 2020-09-10 DIAGNOSIS — M6281 Muscle weakness (generalized): Secondary | ICD-10-CM | POA: Diagnosis not present

## 2020-09-10 DIAGNOSIS — R262 Difficulty in walking, not elsewhere classified: Secondary | ICD-10-CM | POA: Diagnosis not present

## 2020-09-12 DIAGNOSIS — L84 Corns and callosities: Secondary | ICD-10-CM | POA: Diagnosis not present

## 2020-09-12 DIAGNOSIS — F0390 Unspecified dementia without behavioral disturbance: Secondary | ICD-10-CM | POA: Diagnosis not present

## 2020-09-12 DIAGNOSIS — R262 Difficulty in walking, not elsewhere classified: Secondary | ICD-10-CM | POA: Diagnosis not present

## 2020-09-12 DIAGNOSIS — L603 Nail dystrophy: Secondary | ICD-10-CM | POA: Diagnosis not present

## 2020-09-12 DIAGNOSIS — I739 Peripheral vascular disease, unspecified: Secondary | ICD-10-CM | POA: Diagnosis not present

## 2020-09-12 DIAGNOSIS — M6281 Muscle weakness (generalized): Secondary | ICD-10-CM | POA: Diagnosis not present

## 2020-09-12 DIAGNOSIS — E1151 Type 2 diabetes mellitus with diabetic peripheral angiopathy without gangrene: Secondary | ICD-10-CM | POA: Diagnosis not present

## 2020-09-13 DIAGNOSIS — R262 Difficulty in walking, not elsewhere classified: Secondary | ICD-10-CM | POA: Diagnosis not present

## 2020-09-13 DIAGNOSIS — F0390 Unspecified dementia without behavioral disturbance: Secondary | ICD-10-CM | POA: Diagnosis not present

## 2020-09-13 DIAGNOSIS — M6281 Muscle weakness (generalized): Secondary | ICD-10-CM | POA: Diagnosis not present

## 2020-09-16 DIAGNOSIS — R262 Difficulty in walking, not elsewhere classified: Secondary | ICD-10-CM | POA: Diagnosis not present

## 2020-09-16 DIAGNOSIS — Z20828 Contact with and (suspected) exposure to other viral communicable diseases: Secondary | ICD-10-CM | POA: Diagnosis not present

## 2020-09-16 DIAGNOSIS — M6281 Muscle weakness (generalized): Secondary | ICD-10-CM | POA: Diagnosis not present

## 2020-09-16 DIAGNOSIS — F0390 Unspecified dementia without behavioral disturbance: Secondary | ICD-10-CM | POA: Diagnosis not present

## 2020-09-17 DIAGNOSIS — F0390 Unspecified dementia without behavioral disturbance: Secondary | ICD-10-CM | POA: Diagnosis not present

## 2020-09-17 DIAGNOSIS — M6281 Muscle weakness (generalized): Secondary | ICD-10-CM | POA: Diagnosis not present

## 2020-09-17 DIAGNOSIS — R262 Difficulty in walking, not elsewhere classified: Secondary | ICD-10-CM | POA: Diagnosis not present

## 2020-09-18 ENCOUNTER — Other Ambulatory Visit: Payer: Self-pay

## 2020-09-18 ENCOUNTER — Ambulatory Visit (INDEPENDENT_AMBULATORY_CARE_PROVIDER_SITE_OTHER): Payer: Medicare Other | Admitting: Internal Medicine

## 2020-09-18 ENCOUNTER — Encounter: Payer: Self-pay | Admitting: Internal Medicine

## 2020-09-18 DIAGNOSIS — I10 Essential (primary) hypertension: Secondary | ICD-10-CM | POA: Diagnosis not present

## 2020-09-18 DIAGNOSIS — E1149 Type 2 diabetes mellitus with other diabetic neurological complication: Secondary | ICD-10-CM | POA: Diagnosis not present

## 2020-09-18 DIAGNOSIS — R350 Frequency of micturition: Secondary | ICD-10-CM

## 2020-09-18 DIAGNOSIS — I25810 Atherosclerosis of coronary artery bypass graft(s) without angina pectoris: Secondary | ICD-10-CM

## 2020-09-18 LAB — URINALYSIS, ROUTINE W REFLEX MICROSCOPIC
Bilirubin Urine: NEGATIVE
Hgb urine dipstick: NEGATIVE
Ketones, ur: NEGATIVE
Leukocytes,Ua: NEGATIVE
Nitrite: NEGATIVE
RBC / HPF: NONE SEEN (ref 0–?)
Specific Gravity, Urine: 1.02 (ref 1.000–1.030)
Urine Glucose: NEGATIVE
Urobilinogen, UA: 0.2 (ref 0.0–1.0)
pH: 6.5 (ref 5.0–8.0)

## 2020-09-18 MED ORDER — CIPROFLOXACIN HCL 500 MG PO TABS: 500.0000 mg | ORAL_TABLET | Freq: Two times a day (BID) | ORAL | 0 refills | Status: AC

## 2020-09-18 NOTE — Patient Instructions (Signed)
Please take all new medication as prescribed - the antibiotic  Please continue all other medications as before, and refills have been done if requested.  Please have the pharmacy call with any other refills you may need.  Please keep your appointments with your specialists as you may have planned  Please go to the LAB at the blood drawing area for the tests to be done - just the urine testing today  You will be contacted by phone if any changes need to be made immediately.  Otherwise, you will receive a letter about your results with an explanation, but please check with MyChart first.  Please remember to sign up for MyChart if you have not done so, as this will be important to you in the future with finding out test results, communicating by private email, and scheduling acute appointments online when needed.   

## 2020-09-18 NOTE — Progress Notes (Signed)
Patient ID: [name], female   DOB: 02-Sep-1930, 87 y.o.   MRN: [id number]        Chief Complaint: urinary frequency x 3 days       HPI:  [name] is a 87 y.o. female here with c/o above, but Denies urinary symptoms such as dysuria, frequency, urgency, flank pain, hematuria or n/v, fever, chills.  Has also been a bit off balance, irritable, currently still lives in independent living at [facility] despite her dementia with a lot support from family, daughter is psychiatrist her with her today.  Pt denies chest pain, increased sob or doe, wheezing, orthopnea, PND, increased LE swelling, palpitations, dizziness or syncope.   Pt denies polydipsia, polyuria, or new focal neuro s/s         Wt Readings from Last 3 Encounters:  09/18/20 150 lb (68 kg)  08/08/20 153 lb (69.4 kg)  03/04/20 143 lb (64.9 kg)   BP Readings from Last 3 Encounters:  09/18/20 (!) 152/68  08/08/20 132/68  03/04/20 (!) 150/74         Past Medical History:  Diagnosis Date  . CAD, NATIVE VESSEL 06/09/2008  . DIABETES MELLITUS, TYPE II   . FIBROMYALGIA   . GERD   . Gout   . HIATAL HERNIA   . HYPERLIPIDEMIA   . HYPERTENSION   . Insomnia   . OA (osteoarthritis)    severe, shoulders, hands - inflammatory, ?RA  . S/P CABG x 1   . S/P inguinal hernia repair   . TUBULOVILLOUS ADENOMA, 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   colo 04/2012 - no polyps - no further screening planned (age >7)  . UNSPECIFIED ANEMIA    Past Surgical History:  Procedure Laterality Date  . ABDOMINAL HYSTERECTOMY  1981  . APPENDECTOMY    . CATARACT EXTRACTION, BILATERAL    . CORONARY ARTERY BYPASS GRAFT  2010  . HERNIA REPAIR     INGUINAL  . I & D EXTREMITY Left 12/07/2017   Procedure: IRRIGATION AND DEBRIDEMENT EXTREMITY WITH FOREIGN BODY REMOVAL;  Surgeon: Nicholes Stairs, MD;  Location: [place];  Service: Orthopedics;  Laterality: Left;  . JOINT REPLACEMENT    . OVARIAN CYST REMOVAL    . REPLACEMENT TOTAL KNEE BILATERAL  Bilateral    Rt-1974, Lt -1989  . REVERSE SHOULDER ARTHROPLASTY  09/08/2011   Procedure: REVERSE SHOULDER ARTHROPLASTY;  Surgeon: Nita Sells, MD;  Location: [facility];  Service: Orthopedics;  Laterality: Left;  left reverse total shoulder arthroplasty  . ROTATOR CUFF REPAIR     Left    reports that she quit smoking about 50 years ago. She has never used smokeless tobacco. She reports current alcohol use of about 1.0 standard drink of alcohol per week. She reports that she does not use drugs. family history includes Cancer in her mother; Dementia in her sister; Diabetes in her daughter, father, and sister; Heart attack in her brother and father; Heart disease in her sister; Heart disease (age of onset: 33) in her father; Hypertension in her daughter and mother; Parkinsonism in her sister; Stomach cancer (age of onset: 38) in her mother; Stroke in her sister and sister; Vascular Disease in her sister. No Known Allergies Current Outpatient Medications on File Prior to Visit  Medication Sig Dispense Refill  . acetaminophen (TYLENOL) 500 MG tablet Take 1,000 mg by mouth 2 (two) times daily.     Marland Kitchen amLODipine (NORVASC) 5 MG tablet Take 1 tablet (5 mg total) by mouth 2 (  two) times daily. 180 tablet 3  . aspirin EC 81 MG tablet Take 81 mg by mouth daily.    . Biotin 1 MG CAPS Take 1 capsule by mouth daily.    . busPIRone (BUSPAR) 10 MG tablet Take 1 tablet (10 mg total) by mouth 2 (two) times daily. 60 tablet 11  . carvedilol (COREG) 6.25 MG tablet TAKE ONE TABLET BY MOUTH TWICE A DAY 180 tablet 3  . cetirizine (ZYRTEC) 10 MG tablet TAKE ONE TABLET BY MOUTH DAILY (Patient taking differently: Take 10 mg by mouth at bedtime.) 90 tablet 3  . Cholecalciferol (D3-1000) 25 MCG (1000 UT) capsule Take 1,000 Units by mouth daily.    . diclofenac sodium (VOLTAREN) 1 % GEL Apply 2 g topically 4 (four) times daily as needed (pain). 100 g 5  . donepezil (ARICEPT) 10 MG tablet Take 1 tablet daily 90 tablet 3   . DULoxetine (CYMBALTA) 60 MG capsule Take 1 capsule (60 mg total) by mouth daily. 90 capsule 3  . gabapentin (NEURONTIN) 300 MG capsule Take 1 capsule (300 mg total) by mouth at bedtime. 90 capsule 3  . meloxicam (MOBIC) 7.5 MG tablet Take 1 tablet (7.5 mg total) by mouth daily. 90 tablet 3  . memantine (NAMENDA) 10 MG tablet Take 1 tablet (10 mg total) by mouth 2 (two) times daily. 180 tablet 3  . omeprazole (PRILOSEC) 40 MG capsule TAKE ONE CAPSULE BY MOUTH DAILY 90 capsule 3  . ramipril (ALTACE) 10 MG capsule Take 1 capsule (10 mg total) by mouth 2 (two) times daily. 180 capsule 3  . atorvastatin (LIPITOR) 40 MG tablet Take 1 tablet (40 mg total) by mouth every other day. (Patient not taking: Reported on 09/18/2020) 140 tablet 3   No current facility-administered medications on file prior to visit.        ROS:  All others reviewed and negative.  Objective        PE:  BP (!) 152/68 (BP Location: Left Arm, Patient Position: Sitting, Cuff Size: Large)   Pulse 67   Temp 98.2 F (36.8 C) (Oral)   Ht 4\' 11"  (1.499 m)   Wt 150 lb (68 kg)   SpO2 97%   BMI 30.30 kg/m                 Constitutional: Pt appears in NAD               HENT: Head: NCAT.                Right Ear: External ear normal.                 Left Ear: External ear normal.                Eyes: . Pupils are equal, round, and reactive to light. Conjunctivae and EOM are normal               Nose: without d/c or deformity               Neck: Neck supple. Gross normal ROM               Cardiovascular: Normal rate and regular rhythm.                 Pulmonary/Chest: Effort normal and breath sounds without rales or wheezing.                Abd:  Soft, mild tender low mid  abd, ND, + BS, no organomegaly               Neurological: Pt is alert. At baseline orientation, motor grossly intact               Skin: Skin is warm. No rashes, no other new lesions, LE edema - none               Psychiatric: Pt behavior is normal without  agitation   Micro: none  Cardiac tracings I have personally interpreted today:  none  Pertinent Radiological findings (summarize): none   Lab Results  Component Value Date   WBC 5.8 08/08/2020   HGB 9.8 (L) 08/08/2020   HCT 31.2 (L) 08/08/2020   PLT 211.0 08/08/2020   GLUCOSE 101 (H) 08/08/2020   CHOL 187 12/28/2019   TRIG 74 12/28/2019   HDL 100 12/28/2019   LDLDIRECT 115.3 09/30/2009   LDLCALC 72 12/28/2019   ALT 13 08/08/2020   AST 21 08/08/2020   NA 138 08/08/2020   K 4.9 08/08/2020   CL 103 08/08/2020   CREATININE 1.00 08/08/2020   BUN 19 08/08/2020   CO2 29 08/08/2020   TSH 0.88 12/28/2019   INR 0.99 11/06/2010   HGBA1C 6.3 08/08/2020   MICROALBUR 1.5 04/02/2015   Assessment/Plan:  [name] is a 87 y.o. Black or African American [2] female with  has a past medical history of CAD, NATIVE VESSEL (06/09/2008), DIABETES MELLITUS, TYPE II, FIBROMYALGIA, GERD, Gout, HIATAL HERNIA, HYPERLIPIDEMIA, HYPERTENSION, Insomnia, OA (osteoarthritis), S/P CABG x 1, S/P inguinal hernia repair, TUBULOVILLOUS ADENOMA, COLON (03/02/2007), and UNSPECIFIED ANEMIA.  Urinary frequency Cant r/o uti vs other low abd/pelvic issue - for urine studies and empiric cipro,  to f/u any worsening symptoms or concerns  Essential hypertension BP Readings from Last 3 Encounters:  09/18/20 (!) 152/68  08/08/20 132/68  03/04/20 (!) 150/74   Mild elevated, likely reactive, pt to continue medical treatment norvasc, coreg, altace  Type II diabetes mellitus with neurological manifestations ([place]) Lab Results  Component Value Date   HGBA1C 6.3 08/08/2020   Stable, pt to continue current medical treatment   - diet   Followup: Return if symptoms worsen or fail to improve.  Cathlean Cower, MD 09/21/2020 8:11 PM [facility]

## 2020-09-19 ENCOUNTER — Telehealth: Payer: Self-pay | Admitting: Neurology

## 2020-09-19 DIAGNOSIS — M6281 Muscle weakness (generalized): Secondary | ICD-10-CM | POA: Diagnosis not present

## 2020-09-19 DIAGNOSIS — R262 Difficulty in walking, not elsewhere classified: Secondary | ICD-10-CM | POA: Diagnosis not present

## 2020-09-19 DIAGNOSIS — F0390 Unspecified dementia without behavioral disturbance: Secondary | ICD-10-CM | POA: Diagnosis not present

## 2020-09-19 NOTE — Telephone Encounter (Signed)
Dr [name] Heck called in and wanted to speak with nurse

## 2020-09-19 NOTE — Telephone Encounter (Signed)
Line busy at 4:19 09/19/2020

## 2020-09-20 LAB — URINE CULTURE

## 2020-09-20 NOTE — Telephone Encounter (Signed)
No answer at 10:18 09/20/2020.

## 2020-09-21 ENCOUNTER — Encounter: Payer: Self-pay | Admitting: Internal Medicine

## 2020-09-21 NOTE — Assessment & Plan Note (Signed)
Lab Results  Component Value Date   HGBA1C 6.3 08/08/2020   Stable, pt to continue current medical treatment   - diet

## 2020-09-21 NOTE — Assessment & Plan Note (Signed)
BP Readings from Last 3 Encounters:  09/18/20 (!) 152/68  08/08/20 132/68  03/04/20 (!) 150/74   Mild elevated, likely reactive, pt to continue medical treatment norvasc, coreg, altace

## 2020-09-21 NOTE — Assessment & Plan Note (Signed)
Cant r/o uti vs other low abd/pelvic issue - for urine studies and empiric cipro,  to f/u any worsening symptoms or concerns

## 2020-09-23 ENCOUNTER — Other Ambulatory Visit: Payer: Self-pay

## 2020-09-23 DIAGNOSIS — F320 Major depressive disorder, single episode, mild: Secondary | ICD-10-CM

## 2020-09-23 DIAGNOSIS — Z20828 Contact with and (suspected) exposure to other viral communicable diseases: Secondary | ICD-10-CM | POA: Diagnosis not present

## 2020-09-23 DIAGNOSIS — F0390 Unspecified dementia without behavioral disturbance: Secondary | ICD-10-CM

## 2020-09-23 DIAGNOSIS — F3289 Other specified depressive episodes: Secondary | ICD-10-CM

## 2020-09-23 DIAGNOSIS — F03A0 Unspecified dementia, mild, without behavioral disturbance, psychotic disturbance, mood disturbance, and anxiety: Secondary | ICD-10-CM

## 2020-09-23 NOTE — Telephone Encounter (Signed)
Spoke to daughter, she has periods of delusions 1-2 times a day, worse recently. Urine culture positive for UTI, treated with antibiotic but Dr. [name] Heck still feels she would benefit from Seroquel, we discussed proceeding with EKG then plan to start Seroquel 25mg  1/2 tab qhs.   Heather, pls send order for EKG to [facility], to contact daughter Dr. [name] Heck to schedule, thanks

## 2020-09-23 NOTE — Telephone Encounter (Signed)
EKG order placed and faxed to [facility]

## 2020-09-24 DIAGNOSIS — M6281 Muscle weakness (generalized): Secondary | ICD-10-CM | POA: Diagnosis not present

## 2020-09-24 DIAGNOSIS — R262 Difficulty in walking, not elsewhere classified: Secondary | ICD-10-CM | POA: Diagnosis not present

## 2020-09-24 DIAGNOSIS — F0390 Unspecified dementia without behavioral disturbance: Secondary | ICD-10-CM | POA: Diagnosis not present

## 2020-09-25 ENCOUNTER — Ambulatory Visit: Payer: Self-pay

## 2020-09-26 ENCOUNTER — Ambulatory Visit (INDEPENDENT_AMBULATORY_CARE_PROVIDER_SITE_OTHER): Payer: Medicare Other | Admitting: Internal Medicine

## 2020-09-26 ENCOUNTER — Other Ambulatory Visit: Payer: Self-pay

## 2020-09-26 ENCOUNTER — Other Ambulatory Visit: Payer: Self-pay | Admitting: Neurology

## 2020-09-26 ENCOUNTER — Encounter: Payer: Self-pay | Admitting: Internal Medicine

## 2020-09-26 VITALS — BP 128/64 | HR 71 | Ht 59.0 in | Wt 151.0 lb

## 2020-09-26 DIAGNOSIS — R350 Frequency of micturition: Secondary | ICD-10-CM

## 2020-09-26 DIAGNOSIS — M6281 Muscle weakness (generalized): Secondary | ICD-10-CM | POA: Diagnosis not present

## 2020-09-26 DIAGNOSIS — F0390 Unspecified dementia without behavioral disturbance: Secondary | ICD-10-CM | POA: Diagnosis not present

## 2020-09-26 DIAGNOSIS — I10 Essential (primary) hypertension: Secondary | ICD-10-CM | POA: Diagnosis not present

## 2020-09-26 DIAGNOSIS — F03A0 Unspecified dementia, mild, without behavioral disturbance, psychotic disturbance, mood disturbance, and anxiety: Secondary | ICD-10-CM

## 2020-09-26 DIAGNOSIS — I25810 Atherosclerosis of coronary artery bypass graft(s) without angina pectoris: Secondary | ICD-10-CM

## 2020-09-26 DIAGNOSIS — E1149 Type 2 diabetes mellitus with other diabetic neurological complication: Secondary | ICD-10-CM | POA: Diagnosis not present

## 2020-09-26 DIAGNOSIS — R262 Difficulty in walking, not elsewhere classified: Secondary | ICD-10-CM | POA: Diagnosis not present

## 2020-09-26 DIAGNOSIS — Z20828 Contact with and (suspected) exposure to other viral communicable diseases: Secondary | ICD-10-CM | POA: Diagnosis not present

## 2020-09-26 MED ORDER — QUETIAPINE FUMARATE 25 MG PO TABS: ORAL_TABLET | ORAL | 5 refills | Status: DC

## 2020-09-26 NOTE — Progress Notes (Signed)
Patient ID: Saydi Kobel, female   DOB: 04-Nov-1930, 87 y.o.   MRN: [id number]        Chief Complaint: needs ECG to r/o QT prolongation       HPI:  Magaline Steinberg is a 87 y.o. female here with above at request of neurology prior to higher risk treatment.  Pt denies chest pain, increased sob or doe, wheezing, orthopnea, PND, increased LE swelling, palpitations, dizziness or syncope.   Pt denies polydipsia, polyuria, or new focal neuro s/s.  No other new complaints  Dementia overall stable symptomatically today   Denies urinary symptoms such as dysuria, frequency, urgency, flank pain, hematuria or n/v, fever, chills - recent UTI resolved.    Wt Readings from Last 3 Encounters:  09/26/20 151 lb (68.5 kg)  09/18/20 150 lb (68 kg)  08/08/20 153 lb (69.4 kg)   BP Readings from Last 3 Encounters:  09/26/20 128/64  09/18/20 (!) 152/68  08/08/20 132/68         Past Medical History:  Diagnosis Date   CAD, NATIVE VESSEL 06/09/2008   DIABETES MELLITUS, TYPE II    FIBROMYALGIA    GERD    Gout    HIATAL HERNIA    HYPERLIPIDEMIA    HYPERTENSION    Insomnia    OA (osteoarthritis)    severe, shoulders, hands - inflammatory, ?RA   S/P CABG x 1    S/P inguinal hernia repair    TUBULOVILLOUS ADENOMA, 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   colo 04/2012 - no polyps - no further screening planned (age >56)   UNSPECIFIED ANEMIA    Past Surgical History:  Procedure Laterality Date   ABDOMINAL HYSTERECTOMY  1981   APPENDECTOMY     CATARACT EXTRACTION, BILATERAL     CORONARY ARTERY BYPASS GRAFT  2010   HERNIA REPAIR     INGUINAL   I & D EXTREMITY Left 12/07/2017   Procedure: IRRIGATION AND DEBRIDEMENT EXTREMITY WITH FOREIGN BODY REMOVAL;  Surgeon: Nicholes Stairs, MD;  Location: [place];  Service: Orthopedics;  Laterality: Left;   JOINT REPLACEMENT     OVARIAN CYST REMOVAL     REPLACEMENT TOTAL KNEE BILATERAL Bilateral    Rt-1974, Lt -1989   REVERSE SHOULDER ARTHROPLASTY  09/08/2011   Procedure:  REVERSE SHOULDER ARTHROPLASTY;  Surgeon: Nita Sells, MD;  Location: [place];  Service: Orthopedics;  Laterality: Left;  left reverse total shoulder arthroplasty   ROTATOR CUFF REPAIR     Left    reports that she quit smoking about 50 years ago. She has never used smokeless tobacco. She reports current alcohol use of about 1.0 standard drink of alcohol per week. She reports that she does not use drugs. family history includes Cancer in her mother; Dementia in her sister; Diabetes in her daughter, father, and sister; Heart attack in her brother and father; Heart disease in her sister; Heart disease (age of onset: 77) in her father; Hypertension in her daughter and mother; Parkinsonism in her sister; Stomach cancer (age of onset: 43) in her mother; Stroke in her sister and sister; Vascular Disease in her sister. No Known Allergies Current Outpatient Medications on File Prior to Visit  Medication Sig Dispense Refill   acetaminophen (TYLENOL) 500 MG tablet Take 1,000 mg by mouth 2 (two) times daily.      amLODipine (NORVASC) 5 MG tablet Take 1 tablet (5 mg total) by mouth 2 (two) times daily. 180 tablet 3   aspirin EC 81 MG tablet Take 81  mg by mouth daily.     atorvastatin (LIPITOR) 40 MG tablet Take 1 tablet (40 mg total) by mouth every other day. 140 tablet 3   Biotin 1 MG CAPS Take 1 capsule by mouth daily.     busPIRone (BUSPAR) 10 MG tablet Take 1 tablet (10 mg total) by mouth 2 (two) times daily. 60 tablet 11   carvedilol (COREG) 6.25 MG tablet TAKE ONE TABLET BY MOUTH TWICE A DAY 180 tablet 3   cetirizine (ZYRTEC) 10 MG tablet TAKE ONE TABLET BY MOUTH DAILY (Patient taking differently: Take 10 mg by mouth at bedtime.) 90 tablet 3   Cholecalciferol (D3-1000) 25 MCG (1000 UT) capsule Take 1,000 Units by mouth daily.     diclofenac sodium (VOLTAREN) 1 % GEL Apply 2 g topically 4 (four) times daily as needed (pain). 100 g 5   donepezil (ARICEPT) 10 MG tablet Take 1 tablet daily 90 tablet  3   DULoxetine (CYMBALTA) 60 MG capsule Take 1 capsule (60 mg total) by mouth daily. 90 capsule 3   gabapentin (NEURONTIN) 300 MG capsule Take 1 capsule (300 mg total) by mouth at bedtime. 90 capsule 3   meloxicam (MOBIC) 7.5 MG tablet Take 1 tablet (7.5 mg total) by mouth daily. 90 tablet 3   memantine (NAMENDA) 10 MG tablet Take 1 tablet (10 mg total) by mouth 2 (two) times daily. 180 tablet 3   omeprazole (PRILOSEC) 40 MG capsule TAKE ONE CAPSULE BY MOUTH DAILY 90 capsule 3   ramipril (ALTACE) 10 MG capsule Take 1 capsule (10 mg total) by mouth 2 (two) times daily. 180 capsule 3   No current facility-administered medications on file prior to visit.        ROS:  All others reviewed and negative.  Objective        PE:  BP 128/64 (BP Location: Left Arm, Patient Position: Sitting, Cuff Size: Large)   Pulse 71   Ht 4\' 11"  (1.499 m)   Wt 151 lb (68.5 kg)   SpO2 96%   BMI 30.50 kg/m                 Constitutional: Pt appears in NAD               HENT: Head: NCAT.                Right Ear: External ear normal.                 Left Ear: External ear normal.                Eyes: . Pupils are equal, round, and reactive to light. Conjunctivae and EOM are normal               Nose: without d/c or deformity               Neck: Neck supple. Gross normal ROM               Cardiovascular: Normal rate and regular rhythm.                 Pulmonary/Chest: Effort normal and breath sounds without rales or wheezing.                Neurological: Pt is alert. At baseline orientation, motor grossly intact               Skin: Skin is warm. No rashes, no other new lesions, LE edema -  none               Psychiatric: Pt behavior is normal without agitation   Micro: none  Cardiac tracings I have personally interpreted today:  ECG - SR with non specific changes  Pertinent Radiological findings (summarize): none   Lab Results  Component Value Date   WBC 5.8 08/08/2020   HGB 9.8 (L) 08/08/2020   HCT  31.2 (L) 08/08/2020   PLT 211.0 08/08/2020   GLUCOSE 101 (H) 08/08/2020   CHOL 187 12/28/2019   TRIG 74 12/28/2019   HDL 100 12/28/2019   LDLDIRECT 115.3 09/30/2009   LDLCALC 72 12/28/2019   ALT 13 08/08/2020   AST 21 08/08/2020   NA 138 08/08/2020   K 4.9 08/08/2020   CL 103 08/08/2020   CREATININE 1.00 08/08/2020   BUN 19 08/08/2020   CO2 29 08/08/2020   TSH 0.88 12/28/2019   INR 0.99 11/06/2010   HGBA1C 6.3 08/08/2020   MICROALBUR 1.5 04/02/2015   Assessment/Plan:  Taija Mathias is a 87 y.o. Black or African American [2] female with  has a past medical history of CAD, NATIVE VESSEL (06/09/2008), DIABETES MELLITUS, TYPE II, FIBROMYALGIA, GERD, Gout, HIATAL HERNIA, HYPERLIPIDEMIA, HYPERTENSION, Insomnia, OA (osteoarthritis), S/P CABG x 1, S/P inguinal hernia repair, TUBULOVILLOUS ADENOMA, COLON (03/02/2007), and UNSPECIFIED ANEMIA.  Essential hypertension BP Readings from Last 3 Encounters:  09/26/20 128/64  09/18/20 (!) 152/68  08/08/20 132/68   Stable, pt to continue medical treatment norvasc coreg, altace; ecg reviewed - no QT prolongation   Urinary frequency Resolved, no further tx or eval needed, exam benign  Mild dementia ([place]) Followed per neurology, to continue f/u as planned;   Type II diabetes mellitus with neurological manifestations [facility]) Lab Results  Component Value Date   HGBA1C 6.3 08/08/2020   Stable, pt to continue current medical treatment  - diet  Followup: Return if symptoms worsen or fail to improve.  Cathlean Cower, MD 09/29/2020 2:11 PM [facility]

## 2020-09-26 NOTE — Patient Instructions (Signed)
Your EKG was essentially normal today  I have sent a note to Dr Delice Lesch  Please finish your antibiotic as you have planned  Please continue all other medications as before, and refills have been done if requested.  Please have the pharmacy call with any other refills you may need.  Please keep your appointments with your specialists as you may have planned

## 2020-09-27 DIAGNOSIS — R262 Difficulty in walking, not elsewhere classified: Secondary | ICD-10-CM | POA: Diagnosis not present

## 2020-09-27 DIAGNOSIS — M6281 Muscle weakness (generalized): Secondary | ICD-10-CM | POA: Diagnosis not present

## 2020-09-27 DIAGNOSIS — F0390 Unspecified dementia without behavioral disturbance: Secondary | ICD-10-CM | POA: Diagnosis not present

## 2020-09-29 ENCOUNTER — Encounter: Payer: Self-pay | Admitting: Internal Medicine

## 2020-09-29 NOTE — Assessment & Plan Note (Signed)
Resolved, no further tx or eval needed, exam benign

## 2020-09-29 NOTE — Assessment & Plan Note (Signed)
Followed per neurology, to continue f/u as planned;

## 2020-09-29 NOTE — Assessment & Plan Note (Signed)
Lab Results  Component Value Date   HGBA1C 6.3 08/08/2020   Stable, pt to continue current medical treatment  - diet

## 2020-09-29 NOTE — Assessment & Plan Note (Signed)
BP Readings from Last 3 Encounters:  09/26/20 128/64  09/18/20 (!) 152/68  08/08/20 132/68   Stable, pt to continue medical treatment norvasc coreg, altace; ecg reviewed - no QT prolongation

## 2020-09-30 DIAGNOSIS — Z20828 Contact with and (suspected) exposure to other viral communicable diseases: Secondary | ICD-10-CM | POA: Diagnosis not present

## 2020-10-01 DIAGNOSIS — M6281 Muscle weakness (generalized): Secondary | ICD-10-CM | POA: Diagnosis not present

## 2020-10-01 DIAGNOSIS — R262 Difficulty in walking, not elsewhere classified: Secondary | ICD-10-CM | POA: Diagnosis not present

## 2020-10-01 DIAGNOSIS — F0390 Unspecified dementia without behavioral disturbance: Secondary | ICD-10-CM | POA: Diagnosis not present

## 2020-10-03 DIAGNOSIS — R262 Difficulty in walking, not elsewhere classified: Secondary | ICD-10-CM | POA: Diagnosis not present

## 2020-10-03 DIAGNOSIS — F0390 Unspecified dementia without behavioral disturbance: Secondary | ICD-10-CM | POA: Diagnosis not present

## 2020-10-03 DIAGNOSIS — M6281 Muscle weakness (generalized): Secondary | ICD-10-CM | POA: Diagnosis not present

## 2020-10-03 DIAGNOSIS — Z20828 Contact with and (suspected) exposure to other viral communicable diseases: Secondary | ICD-10-CM | POA: Diagnosis not present

## 2020-10-07 DIAGNOSIS — Z20828 Contact with and (suspected) exposure to other viral communicable diseases: Secondary | ICD-10-CM | POA: Diagnosis not present

## 2020-10-10 DIAGNOSIS — Z20828 Contact with and (suspected) exposure to other viral communicable diseases: Secondary | ICD-10-CM | POA: Diagnosis not present

## 2020-10-17 ENCOUNTER — Other Ambulatory Visit (INDEPENDENT_AMBULATORY_CARE_PROVIDER_SITE_OTHER): Payer: Medicare Other

## 2020-10-17 DIAGNOSIS — D509 Iron deficiency anemia, unspecified: Secondary | ICD-10-CM

## 2020-10-17 DIAGNOSIS — Z20828 Contact with and (suspected) exposure to other viral communicable diseases: Secondary | ICD-10-CM | POA: Diagnosis not present

## 2020-10-17 LAB — CBC WITH DIFFERENTIAL/PLATELET
Basophils Absolute: 0 10*3/uL (ref 0.0–0.1)
Basophils Relative: 0.6 % (ref 0.0–3.0)
Eosinophils Absolute: 0.6 10*3/uL (ref 0.0–0.7)
Eosinophils Relative: 10.4 % — ABNORMAL HIGH (ref 0.0–5.0)
HCT: 37.3 % (ref 36.0–46.0)
Hemoglobin: 12 g/dL (ref 12.0–15.0)
Lymphocytes Relative: 37.9 % (ref 12.0–46.0)
Lymphs Abs: 2 10*3/uL (ref 0.7–4.0)
MCHC: 32.2 g/dL (ref 30.0–36.0)
MCV: 90.4 fl (ref 78.0–100.0)
Monocytes Absolute: 0.5 10*3/uL (ref 0.1–1.0)
Monocytes Relative: 8.6 % (ref 3.0–12.0)
Neutro Abs: 2.2 10*3/uL (ref 1.4–7.7)
Neutrophils Relative %: 42.5 % — ABNORMAL LOW (ref 43.0–77.0)
Platelets: 192 10*3/uL (ref 150.0–400.0)
RBC: 4.13 Mil/uL (ref 3.87–5.11)
RDW: 19.1 % — ABNORMAL HIGH (ref 11.5–15.5)
WBC: 5.3 10*3/uL (ref 4.0–10.5)

## 2020-10-18 LAB — IRON,TIBC AND FERRITIN PANEL
%SAT: 30 % (calc) (ref 16–45)
Ferritin: 35 ng/mL (ref 16–288)
Iron: 95 ug/dL (ref 45–160)
TIBC: 321 mcg/dL (calc) (ref 250–450)

## 2020-10-22 DIAGNOSIS — Z20828 Contact with and (suspected) exposure to other viral communicable diseases: Secondary | ICD-10-CM | POA: Diagnosis not present

## 2020-10-22 DIAGNOSIS — Z20822 Contact with and (suspected) exposure to covid-19: Secondary | ICD-10-CM | POA: Diagnosis not present

## 2020-10-24 DIAGNOSIS — Z20828 Contact with and (suspected) exposure to other viral communicable diseases: Secondary | ICD-10-CM | POA: Diagnosis not present

## 2020-10-30 DIAGNOSIS — Z20822 Contact with and (suspected) exposure to covid-19: Secondary | ICD-10-CM | POA: Diagnosis not present

## 2020-10-31 DIAGNOSIS — Z20828 Contact with and (suspected) exposure to other viral communicable diseases: Secondary | ICD-10-CM | POA: Diagnosis not present

## 2020-11-14 DIAGNOSIS — Z20828 Contact with and (suspected) exposure to other viral communicable diseases: Secondary | ICD-10-CM | POA: Diagnosis not present

## 2020-11-20 DIAGNOSIS — Z20822 Contact with and (suspected) exposure to covid-19: Secondary | ICD-10-CM | POA: Diagnosis not present

## 2020-11-28 DIAGNOSIS — Z20828 Contact with and (suspected) exposure to other viral communicable diseases: Secondary | ICD-10-CM | POA: Diagnosis not present

## 2020-12-05 ENCOUNTER — Telehealth: Payer: Self-pay | Admitting: Internal Medicine

## 2020-12-05 DIAGNOSIS — I25810 Atherosclerosis of coronary artery bypass graft(s) without angina pectoris: Secondary | ICD-10-CM

## 2020-12-05 MED ORDER — CARVEDILOL 6.25 MG PO TABS
ORAL_TABLET | ORAL | 1 refills | Status: DC
Start: 2020-12-05 — End: 2021-09-19

## 2020-12-05 MED ORDER — ATORVASTATIN CALCIUM 40 MG PO TABS: 40.0000 mg | ORAL_TABLET | ORAL | 1 refills | Status: DC

## 2020-12-05 NOTE — Telephone Encounter (Signed)
*  STAT* If patient is at the pharmacy, call can be transferred to refill team.   1. Which medications need to be refilled? (please list name of each medication and dose if known) atorvastatin (LIPITOR) 40 MG tablet carvedilol (COREG) 6.25 MG tablet  2. Which pharmacy/location (including street and city if local pharmacy) is medication to be sent to? [facility] [id number] - [place], [place]  3. Do they need a 30 day or 90 day supply? 90 day supply   Pt is out of this medication.pt has a appt on 05/2021 and needs medication up until that date

## 2020-12-05 NOTE — Telephone Encounter (Signed)
Refill for Atorvastatin & Carvedilol has been sent to [facility].

## 2020-12-09 DIAGNOSIS — Z20828 Contact with and (suspected) exposure to other viral communicable diseases: Secondary | ICD-10-CM | POA: Diagnosis not present

## 2020-12-12 ENCOUNTER — Ambulatory Visit (INDEPENDENT_AMBULATORY_CARE_PROVIDER_SITE_OTHER): Payer: Medicare Other | Admitting: Neurology

## 2020-12-12 ENCOUNTER — Other Ambulatory Visit: Payer: Self-pay

## 2020-12-12 ENCOUNTER — Encounter: Payer: Self-pay | Admitting: Neurology

## 2020-12-12 VITALS — BP 138/56 | HR 77 | Ht 59.0 in | Wt 146.8 lb

## 2020-12-12 DIAGNOSIS — I25810 Atherosclerosis of coronary artery bypass graft(s) without angina pectoris: Secondary | ICD-10-CM | POA: Diagnosis not present

## 2020-12-12 DIAGNOSIS — Z20828 Contact with and (suspected) exposure to other viral communicable diseases: Secondary | ICD-10-CM | POA: Diagnosis not present

## 2020-12-12 DIAGNOSIS — E1151 Type 2 diabetes mellitus with diabetic peripheral angiopathy without gangrene: Secondary | ICD-10-CM | POA: Diagnosis not present

## 2020-12-12 DIAGNOSIS — I739 Peripheral vascular disease, unspecified: Secondary | ICD-10-CM | POA: Diagnosis not present

## 2020-12-12 DIAGNOSIS — F0390 Unspecified dementia without behavioral disturbance: Secondary | ICD-10-CM

## 2020-12-12 DIAGNOSIS — F419 Anxiety disorder, unspecified: Secondary | ICD-10-CM | POA: Diagnosis not present

## 2020-12-12 DIAGNOSIS — L603 Nail dystrophy: Secondary | ICD-10-CM | POA: Diagnosis not present

## 2020-12-12 DIAGNOSIS — F32A Depression, unspecified: Secondary | ICD-10-CM | POA: Diagnosis not present

## 2020-12-12 DIAGNOSIS — F03A0 Unspecified dementia, mild, without behavioral disturbance, psychotic disturbance, mood disturbance, and anxiety: Secondary | ICD-10-CM

## 2020-12-12 DIAGNOSIS — L84 Corns and callosities: Secondary | ICD-10-CM | POA: Diagnosis not present

## 2020-12-12 MED ORDER — BUSPIRONE HCL 15 MG PO TABS: 15.0000 mg | ORAL_TABLET | Freq: Two times a day (BID) | ORAL | 3 refills | Status: DC

## 2020-12-12 MED ORDER — DONEPEZIL HCL 10 MG PO TABS: ORAL_TABLET | ORAL | 3 refills | Status: DC

## 2020-12-12 MED ORDER — DULOXETINE HCL 60 MG PO CPEP
60.0000 mg | ORAL_CAPSULE | Freq: Every day | ORAL | 3 refills | Status: DC
Start: 2020-12-12 — End: 2021-12-12

## 2020-12-12 MED ORDER — QUETIAPINE FUMARATE 50 MG PO TABS: 50.0000 mg | ORAL_TABLET | Freq: Every day | ORAL | 3 refills | Status: DC

## 2020-12-12 MED ORDER — MEMANTINE HCL 10 MG PO TABS: 10.0000 mg | ORAL_TABLET | Freq: Two times a day (BID) | ORAL | 3 refills | Status: DC

## 2020-12-12 NOTE — Patient Instructions (Signed)
Always good to see you!  Increase Seroquel to '50mg'$ : take 1 tablet every night  2. Continue Buspar '15mg'$  twice a day, Cymbalta '60mg'$  daily, Donepezil '10mg'$  daily, and Memantine '10mg'$  twice a day  3. Continue close supervision  4. Follow-up in 6 months, call for any changes   FALL PRECAUTIONS: Be cautious when walking. Scan the area for obstacles that may increase the risk of trips and falls. When getting up in the mornings, sit up at the edge of the bed for a few minutes before getting out of bed. Consider elevating the bed at the head end to avoid drop of blood pressure when getting up. Walk always in a well-lit room (use night lights in the walls). Avoid area rugs or power cords from appliances in the middle of the walkways. Use a walker or a cane if necessary and consider physical therapy for balance exercise. Get your eyesight checked regularly.   HOME SAFETY: Consider the safety of the kitchen when operating appliances like stoves, microwave oven, and blender. Consider having supervision and share cooking responsibilities until no longer able to participate in those. Accidents with firearms and other hazards in the house should be identified and addressed as well.  ABILITY TO BE LEFT ALONE: If patient is unable to contact 911 operator, consider using LifeLine, or when the need is there, arrange for someone to stay with patients. Smoking is a fire hazard, consider supervision or cessation. Risk of wandering should be assessed by caregiver and if detected at any point, supervision and safe proof recommendations should be instituted.   RECOMMENDATIONS FOR ALL PATIENTS WITH MEMORY PROBLEMS: 1. Continue to exercise (Recommend 30 minutes of walking everyday, or 3 hours every week) 2. Increase social interactions - continue going to [place] and enjoy social gatherings with friends and family 3. Eat healthy, avoid fried foods and eat more fruits and vegetables 4. Maintain adequate blood pressure, blood  sugar, and blood cholesterol level. Reducing the risk of stroke and cardiovascular disease also helps promoting better memory. 5. Avoid stressful situations. Live a simple life and avoid aggravations. Organize your time and prepare for the next day in anticipation. 6. Sleep well, avoid any interruptions of sleep and avoid any distractions in the bedroom that may interfere with adequate sleep quality 7. Avoid sugar, avoid sweets as there is a strong link between excessive sugar intake, diabetes, and cognitive impairment The Mediterranean diet has been shown to help patients reduce the risk of progressive memory disorders and reduces cardiovascular risk. This includes eating fish, eat fruits and green leafy vegetables, nuts like almonds and hazelnuts, walnuts, and also use olive oil. Avoid fast foods and fried foods as much as possible. Avoid sweets and sugar as sugar use has been linked to worsening of memory function.  There is always a concern of gradual progression of memory problems. If this is the case, then we may need to adjust level of care according to patient needs. Support, both to the patient and caregiver, should then be put into place.
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
## 2020-12-21 ENCOUNTER — Emergency Department (HOSPITAL_BASED_OUTPATIENT_CLINIC_OR_DEPARTMENT_OTHER)
Admission: EM | Admit: 2020-12-21 | Discharge: 2020-12-21 | Disposition: A | Discharge status: Home / Self Care | Payer: Medicare Other | Attending: Emergency Medicine | Admitting: Emergency Medicine

## 2020-12-21 ENCOUNTER — Encounter (HOSPITAL_BASED_OUTPATIENT_CLINIC_OR_DEPARTMENT_OTHER): Payer: Self-pay | Admitting: Emergency Medicine

## 2020-12-21 ENCOUNTER — Other Ambulatory Visit: Payer: Self-pay

## 2020-12-21 DIAGNOSIS — Z5321 Procedure and treatment not carried out due to patient leaving prior to being seen by health care provider: Secondary | ICD-10-CM | POA: Insufficient documentation

## 2020-12-21 DIAGNOSIS — W01198A Fall on same level from slipping, tripping and stumbling with subsequent striking against other object, initial encounter: Secondary | ICD-10-CM | POA: Insufficient documentation

## 2020-12-21 DIAGNOSIS — S01511A Laceration without foreign body of lip, initial encounter: Secondary | ICD-10-CM | POA: Diagnosis not present

## 2020-12-21 NOTE — ED Triage Notes (Signed)
Pt fell yesterday, hit her lip/ has laceration. Fell last night.

## 2020-12-21 NOTE — ED Notes (Signed)
Pt has been called x3 for room. Per registration the patient left over a hour ago.

## 2020-12-26 ENCOUNTER — Other Ambulatory Visit: Payer: Self-pay | Admitting: Internal Medicine

## 2020-12-26 DIAGNOSIS — Z20828 Contact with and (suspected) exposure to other viral communicable diseases: Secondary | ICD-10-CM | POA: Diagnosis not present

## 2020-12-30 DIAGNOSIS — Z20828 Contact with and (suspected) exposure to other viral communicable diseases: Secondary | ICD-10-CM | POA: Diagnosis not present

## 2021-01-02 DIAGNOSIS — Z20828 Contact with and (suspected) exposure to other viral communicable diseases: Secondary | ICD-10-CM | POA: Diagnosis not present

## 2021-01-13 DIAGNOSIS — Z20828 Contact with and (suspected) exposure to other viral communicable diseases: Secondary | ICD-10-CM | POA: Diagnosis not present

## 2021-01-16 DIAGNOSIS — Z8616 Personal history of COVID-19: Secondary | ICD-10-CM | POA: Diagnosis not present

## 2021-01-23 DIAGNOSIS — Z20828 Contact with and (suspected) exposure to other viral communicable diseases: Secondary | ICD-10-CM | POA: Diagnosis not present

## 2021-02-12 NOTE — Patient Instructions (Signed)
  Blood work was ordered.     Medications changes include :     Your prescription(s) have been submitted to your pharmacy. Please take as directed and contact our office if you believe you are having problem(s) with the medication(s).   A referral was ordered for        Someone from their office will call you to schedule an appointment.    Please followup in 6 months  

## 2021-02-12 NOTE — Progress Notes (Signed)
Subjective:    Patient ID: [name], female    DOB: [birth year], 87 y.o.   MRN: [id number]  This visit occurred during the SARS-CoV-2 public health emergency.  Safety protocols were in place, including screening questions prior to the visit, additional usage of staff PPE, and extensive cleaning of exam room while observing appropriate contact time as indicated for disinfecting solutions.     HPI The patient is here for follow up of their chronic medical problems, including htn, hld, DM, gerd, depression, dementia, chronic back pain.  Her daughter is here with her and provides the history.  Walks the halls, doing PT   Not on statin due to possible side effects.    Medications and allergies reviewed with patient and updated if appropriate.  Patient Active Problem List   Diagnosis Date Noted  . Urinary frequency 09/18/2020  . Aortic atherosclerosis ([place]) 08/07/2020  . Mild dementia ([place]) 07/27/2017  . Chronic RUQ pain 05/28/2016  . Right knee pain 06/14/2015  . Diabetic neuropathy ([place]) 03/21/2014  . Bunion 03/21/2014  . PVD (pulmonary valve disease) 01/23/2014  . Onychomycosis 12/20/2013  . PVD (peripheral vascular disease) ([place]) 12/19/2013  . Gout 12/13/2013  . Depression   . Lumbar canal stenosis 05/26/2013  . Neuralgia neuritis, sciatic nerve 05/26/2013  . Degenerative arthritis of lumbar spine with cord compression 04/24/2013  . Arthritis of shoulder region, left 09/11/2011  . Type II diabetes mellitus with neurological manifestations ([place]) 01/17/2009  . Anemia 10/15/2008  . CAD, NATIVE VESSEL 06/09/2008  . Hyperlipidemia 06/15/2007  . Essential hypertension 06/15/2007  . GERD 06/15/2007  . 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 DIVERTICULOSIS, 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    Current Outpatient Medications on File Prior to Visit  Medication Sig Dispense Refill  . acetaminophen (TYLENOL) 500 MG tablet Take 1,000 mg by mouth 2 (two) times daily.     Marland Kitchen  amLODipine (NORVASC) 5 MG tablet Take 1 tablet (5 mg total) by mouth 2 (two) times daily. 180 tablet 3  . aspirin EC 81 MG tablet Take 81 mg by mouth daily.    . busPIRone (BUSPAR) 15 MG tablet Take 1 tablet (15 mg total) by mouth 2 (two) times daily. 180 tablet 3  . carvedilol (COREG) 6.25 MG tablet TAKE ONE TABLET BY MOUTH TWICE A DAY 180 tablet 1  . cetirizine (ZYRTEC) 10 MG tablet TAKE ONE TABLET BY MOUTH DAILY (Patient taking differently: Take 10 mg by mouth at bedtime.) 90 tablet 3  . Cholecalciferol (D3-1000) 25 MCG (1000 UT) capsule Take 1,000 Units by mouth daily.    . diclofenac sodium (VOLTAREN) 1 % GEL Apply 2 g topically 4 (four) times daily as needed (pain). 100 g 5  . DULoxetine (CYMBALTA) 60 MG capsule Take 1 capsule (60 mg total) by mouth daily. 90 capsule 3  . gabapentin (NEURONTIN) 300 MG capsule Take 1 capsule (300 mg total) by mouth at bedtime. 90 capsule 3  . Magnesium 100 MG TABS Take by mouth daily.    . meloxicam (MOBIC) 7.5 MG tablet TAKE ONE TABLET BY MOUTH DAILY 90 tablet 3  . memantine (NAMENDA) 10 MG tablet Take 1 tablet (10 mg total) by mouth 2 (two) times daily. 180 tablet 3  . omeprazole (PRILOSEC) 40 MG capsule TAKE ONE CAPSULE BY MOUTH DAILY 90 capsule 3  . QUEtiapine (SEROQUEL) 50 MG tablet Take 1 tablet (50 mg total) by mouth at bedtime. 90 tablet 3  . ramipril (ALTACE) 10 MG capsule Take  1 capsule (10 mg total) by mouth 2 (two) times daily. 180 capsule 3   No current facility-administered medications on file prior to visit.    Past Medical History:  Diagnosis Date  . CAD, NATIVE VESSEL 06/09/2008  . DIABETES MELLITUS, TYPE II   . FIBROMYALGIA   . GERD   . Gout   . HIATAL HERNIA   . HYPERLIPIDEMIA   . HYPERTENSION   . Insomnia   . OA (osteoarthritis)    severe, shoulders, hands - inflammatory, ?RA  . S/P CABG x 1   . S/P inguinal hernia repair   . TUBULOVILLOUS ADENOMA, 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   colo 04/2012 - no polyps - no further screening planned  (age >43)  . UNSPECIFIED ANEMIA     Past Surgical History:  Procedure Laterality Date  . ABDOMINAL HYSTERECTOMY  1981  . APPENDECTOMY    . CATARACT EXTRACTION, BILATERAL    . CORONARY ARTERY BYPASS GRAFT  2010  . HERNIA REPAIR     INGUINAL  . I & D EXTREMITY Left 12/07/2017   Procedure: IRRIGATION AND DEBRIDEMENT EXTREMITY WITH FOREIGN BODY REMOVAL;  Surgeon: Nicholes Stairs, MD;  Location: [facility];  Service: Orthopedics;  Laterality: Left;  . JOINT REPLACEMENT    . OVARIAN CYST REMOVAL    . REPLACEMENT TOTAL KNEE BILATERAL Bilateral    Rt-1974, Lt -1989  . REVERSE SHOULDER ARTHROPLASTY  09/08/2011   Procedure: REVERSE SHOULDER ARTHROPLASTY;  Surgeon: Nita Sells, MD;  Location: [facility];  Service: Orthopedics;  Laterality: Left;  left reverse total shoulder arthroplasty  . ROTATOR CUFF REPAIR     Left    Social History   Socioeconomic History  . Marital status: Widowed    Spouse name: Not on file  . Number of children: Not on file  . Years of education: Not on file  . Highest education level: Not on file  Occupational History  . Not on file  Tobacco Use  . Smoking status: Former    Types: Cigarettes    Quit date: 04/26/1970    Years since quitting: 50.9  . Smokeless tobacco: Never  Vaping Use  . Vaping Use: Never used  Substance and Sexual Activity  . Alcohol use: Not Currently    Alcohol/week: 1.0 standard drink    Types: 1 Glasses of wine per week  . Drug use: No  . Sexual activity: Not on file  Other Topics Concern  . Not on file  Social History Narrative   Widows, lives alone. 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 Cancer  Mother        Stomach  . Stomach cancer Mother 58  . Hypertension Mother   . Heart disease Father 60       MI  . Diabetes Father   . Heart attack Father   . Vascular Disease Sister   . Stroke Sister   . Dementia Sister   . Diabetes Sister   . Parkinsonism Sister   . Stroke Sister   . Heart disease Sister   . Heart attack Brother   . Stroke Daughter   . Diabetes  Daughter   . Hypertension Daughter   . Colon cancer Neg Hx     Review of Systems     Objective:  There were no vitals filed for this visit. BP Readings from Last 3 Encounters:  02/27/21 138/60  12/21/20 (!) 198/80  12/12/20 (!) 138/56   Wt Readings from Last 3 Encounters:  02/27/21 145 lb (65.8 kg)  12/12/20 146 lb 12.8 oz (66.6 kg)  09/26/20 151 lb (68.5 kg)   There is no height or weight on file to calculate BMI.   Physical Exam    Constitutional: Appears well-developed and well-nourished. No distress.  HENT:  Head: Normocephalic and atraumatic.  Neck: Neck supple. No tracheal deviation present. No thyromegaly present.  No cervical lymphadenopathy Cardiovascular: Normal rate, regular rhythm and normal heart sounds.   No murmur heard. No carotid bruit .  No edema Pulmonary/Chest: Effort normal and breath sounds normal. No respiratory distress. No has no wheezes. No rales.  Skin: Skin is warm and dry. Not diaphoretic.  Psychiatric: Normal mood and affect. Behavior is normal.      Assessment & Plan:    See Problem List for Assessment and Plan of chronic medical problems.     This encounter was created in error - please disregard.

## 2021-02-13 ENCOUNTER — Encounter: Payer: Self-pay | Admitting: Internal Medicine

## 2021-02-13 ENCOUNTER — Encounter: Payer: Medicare Other | Admitting: Internal Medicine

## 2021-02-13 NOTE — Assessment & Plan Note (Signed)
Chronic Management per Dr. Delice Lesch On Aricept 10 mg daily, Namenda 10 mg twice daily, Seroquel 50 mg at night, BuSpar 15 mg twice daily

## 2021-02-13 NOTE — Assessment & Plan Note (Signed)
Chronic Regular exercise and healthy diet encouraged Check lipid panel  Continue diet control-was on atorvastatin, but we stopped this for concerns with possible side effects and felt the risks outweighed the benefits

## 2021-02-13 NOTE — Assessment & Plan Note (Signed)
Chronic Blood pressure well controlled CMP Continue amlodipine 5 mg twice daily, carvedilol 6.25 mg twice daily, ramipril 10 mg twice daily

## 2021-02-13 NOTE — Assessment & Plan Note (Signed)
Chronic Diet controlled Lab Results  Component Value Date   HGBA1C 6.3 08/08/2020   Has been well controlled Check A1c

## 2021-02-13 NOTE — Assessment & Plan Note (Signed)
Depression scratch that chronic Controlled Continue Cymbalta 60 mg daily

## 2021-02-13 NOTE — Assessment & Plan Note (Signed)
Chronic Sugars well controlled Neuropathy controlled Continue gabapentin 300 mg at bedtime, Cymbalta 60 mg daily

## 2021-02-13 NOTE — Assessment & Plan Note (Signed)
Chronic GERD controlled Continue omeprazole 40 mg daily 

## 2021-02-13 NOTE — Assessment & Plan Note (Signed)
Chronic CBC, iron panel

## 2021-02-26 NOTE — Patient Instructions (Addendum)
    Flu immunization administered today.      Blood work was ordered.     Medications changes include :  none   Your prescription(s) have been submitted to your pharmacy. Please take as directed and contact our office if you believe you are having problem(s) with the medication(s).   Please followup in 6 months

## 2021-02-26 NOTE — Progress Notes (Signed)
Subjective:    Patient ID: [name], female    DOB: 1931-04-19, 87 y.o.   MRN: [id number]  This visit occurred during the SARS-CoV-2 public health emergency.  Safety protocols were in place, including screening questions prior to the visit, additional usage of staff PPE, and extensive cleaning of exam room while observing appropriate contact time as indicated for disinfecting solutions.     HPI The patient is here for follow up of their chronic medical problems, including htn, hld, DM, gerd, depression, dementia, chronic back pain.  Her daughter is here with her and provides the history.  Walks the halls, doing PT  Overall doing well.   Some increased confusion over the past week - ? Related to dementia or ? Uti - has had some increased urinary frequency but hard to tell for sure.  Not on statin due to possible side effects.    Medications and allergies reviewed with patient and updated if appropriate.  Patient Active Problem List   Diagnosis Date Noted   Urinary frequency 09/18/2020   Aortic atherosclerosis ([place]) 08/07/2020   Mild dementia ([place]) 07/27/2017   Chronic RUQ pain 05/28/2016   Right knee pain 06/14/2015   Diabetic neuropathy ([place]) 03/21/2014   Bunion 03/21/2014   PVD (pulmonary valve disease) 01/23/2014   Onychomycosis 12/20/2013   PVD (peripheral vascular disease) ([place]) 12/19/2013   Gout 12/13/2013   Depression    Lumbar canal stenosis 05/26/2013   Neuralgia neuritis, sciatic nerve 05/26/2013   Degenerative arthritis of lumbar spine with cord compression 04/24/2013   Arthritis of shoulder region, left 09/11/2011   Type II diabetes mellitus with neurological manifestations ([place]) 01/17/2009   Anemia 10/15/2008   CAD, NATIVE VESSEL 06/09/2008   Hyperlipidemia 06/15/2007   Essential hypertension 06/15/2007   GERD 06/15/2007   TUBULOVILLOUS ADENOMA, 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   DIVERTICULOSIS, 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    Current Outpatient Medications on File  Prior to Visit  Medication Sig Dispense Refill   acetaminophen (TYLENOL) 500 MG tablet Take 1,000 mg by mouth 2 (two) times daily.      amLODipine (NORVASC) 5 MG tablet Take 1 tablet (5 mg total) by mouth 2 (two) times daily. 180 tablet 3   aspirin EC 81 MG tablet Take 81 mg by mouth daily.     busPIRone (BUSPAR) 15 MG tablet Take 1 tablet (15 mg total) by mouth 2 (two) times daily. 180 tablet 3   carvedilol (COREG) 6.25 MG tablet TAKE ONE TABLET BY MOUTH TWICE A DAY 180 tablet 1   cetirizine (ZYRTEC) 10 MG tablet TAKE ONE TABLET BY MOUTH DAILY (Patient taking differently: Take 10 mg by mouth at bedtime.) 90 tablet 3   Cholecalciferol (D3-1000) 25 MCG (1000 UT) capsule Take 1,000 Units by mouth daily.     diclofenac sodium (VOLTAREN) 1 % GEL Apply 2 g topically 4 (four) times daily as needed (pain). 100 g 5   donepezil (ARICEPT) 10 MG tablet Take 1 tablet daily 90 tablet 3   DULoxetine (CYMBALTA) 60 MG capsule Take 1 capsule (60 mg total) by mouth daily. 90 capsule 3   gabapentin (NEURONTIN) 300 MG capsule Take 1 capsule (300 mg total) by mouth at bedtime. 90 capsule 3   Magnesium 100 MG TABS Take by mouth daily.     meloxicam (MOBIC) 7.5 MG tablet TAKE ONE TABLET BY MOUTH DAILY 90 tablet 3   memantine (NAMENDA) 10 MG tablet Take 1 tablet (10 mg total) by mouth 2 (two) times daily.  180 tablet 3   omeprazole (PRILOSEC) 40 MG capsule TAKE ONE CAPSULE BY MOUTH DAILY 90 capsule 3   QUEtiapine (SEROQUEL) 50 MG tablet Take 1 tablet (50 mg total) by mouth at bedtime. 90 tablet 3   ramipril (ALTACE) 10 MG capsule Take 1 capsule (10 mg total) by mouth 2 (two) times daily. 180 capsule 3   No current facility-administered medications on file prior to visit.    Past Medical History:  Diagnosis Date   CAD, NATIVE VESSEL 06/09/2008   DIABETES MELLITUS, TYPE II    FIBROMYALGIA    GERD    Gout    HIATAL HERNIA    HYPERLIPIDEMIA    HYPERTENSION    Insomnia    OA (osteoarthritis)    severe,  shoulders, hands - inflammatory, ?RA   S/P CABG x 1    S/P inguinal hernia repair    TUBULOVILLOUS ADENOMA, 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   colo 04/2012 - no polyps - no further screening planned (age >72)   UNSPECIFIED ANEMIA     Past Surgical History:  Procedure Laterality Date   ABDOMINAL HYSTERECTOMY  1981   APPENDECTOMY     CATARACT EXTRACTION, BILATERAL     CORONARY ARTERY BYPASS GRAFT  2010   HERNIA REPAIR     INGUINAL   I & D EXTREMITY Left 12/07/2017   Procedure: IRRIGATION AND DEBRIDEMENT EXTREMITY WITH FOREIGN BODY REMOVAL;  Surgeon: Nicholes Stairs, MD;  Location: [facility];  Service: Orthopedics;  Laterality: Left;   JOINT REPLACEMENT     OVARIAN CYST REMOVAL     REPLACEMENT TOTAL KNEE BILATERAL Bilateral    Rt-1974, Lt -1989   REVERSE SHOULDER ARTHROPLASTY  09/08/2011   Procedure: REVERSE SHOULDER ARTHROPLASTY;  Surgeon: Nita Sells, MD;  Location: [facility];  Service: Orthopedics;  Laterality: Left;  left reverse total shoulder arthroplasty   ROTATOR CUFF REPAIR     Left    Social History   Socioeconomic History   Marital status: Widowed    Spouse name: Not on file   Number of children: Not on file   Years of education: Not on file   Highest education level: Not on file  Occupational History   Not on file  Tobacco Use   Smoking status: Former    Types: Cigarettes    Quit date: 04/26/1970    Years since quitting: 50.8   Smokeless tobacco: Never  Vaping Use   Vaping Use: Never used  Substance and Sexual Activity   Alcohol use: Not Currently    Alcohol/week: 1.0 standard drink    Types: 1 Glasses of wine per week   Drug use: No   Sexual activity: Not on file  Other Topics Concern   Not on file  Social History Narrative   Widows, lives alone. 2 sons. 2 daughters. Retired Ambulance person   Right handed   Occasionally caffeine   Social Determinants of Radio broadcast assistant Strain: Not on file  Food Insecurity: Not on file  Transportation Needs: Not  on file  Physical Activity: Not on file  Stress: Not on file  Social Connections: Not on file    Family History  Problem Relation Age of Onset   Cancer Mother        Stomach   Stomach cancer Mother 79   Hypertension Mother    Heart disease Father 31       MI   Diabetes Father    Heart attack Father    Vascular Disease  Sister    Stroke Sister    Dementia Sister    Diabetes Sister    Parkinsonism Sister    Stroke Sister    Heart disease Sister    Heart attack Brother    Stroke Daughter    Diabetes Daughter    Hypertension Daughter    Colon cancer Neg Hx     Review of Systems  Constitutional:  Negative for appetite change, chills and fever.  Respiratory:  Negative for cough, shortness of breath and wheezing.   Cardiovascular:  Negative for chest pain, palpitations and leg swelling.  Gastrointestinal:  Negative for abdominal pain and nausea.  Genitourinary:  Positive for frequency and urgency. Negative for dysuria.  Musculoskeletal:  Positive for back pain.  Psychiatric/Behavioral:  Negative for sleep disturbance.       Objective:   Vitals:   02/27/21 1014  BP: 138/60  Pulse: 65  Temp: 98.1 F (36.7 C)  SpO2: 95%   BP Readings from Last 3 Encounters:  02/27/21 138/60  12/21/20 (!) 198/80  12/12/20 (!) 138/56   Wt Readings from Last 3 Encounters:  02/27/21 145 lb (65.8 kg)  12/12/20 146 lb 12.8 oz (66.6 kg)  09/26/20 151 lb (68.5 kg)   Body mass index is 29.29 kg/m.   Physical Exam    Constitutional: Appears well-developed and well-nourished. No distress.  HENT:  Head: Normocephalic and atraumatic.  Neck: Neck supple. No tracheal deviation present. No thyromegaly present.  No cervical lymphadenopathy Cardiovascular: Normal rate, regular rhythm and normal heart sounds.   No murmur heard. No carotid bruit .  No edema Pulmonary/Chest: Effort normal and breath sounds normal. No respiratory distress. No has no wheezes. No rales.  Skin: Skin is warm and  dry. Not diaphoretic.  Psychiatric: Normal mood and affect. Behavior is normal.      Assessment & Plan:    Flu vaccine today.  See Problem List for Assessment and Plan of chronic medical problems.

## 2021-02-27 ENCOUNTER — Ambulatory Visit (INDEPENDENT_AMBULATORY_CARE_PROVIDER_SITE_OTHER): Payer: Medicare Other | Admitting: Internal Medicine

## 2021-02-27 ENCOUNTER — Encounter: Payer: Self-pay | Admitting: Internal Medicine

## 2021-02-27 ENCOUNTER — Other Ambulatory Visit: Payer: Self-pay

## 2021-02-27 VITALS — BP 138/60 | HR 65 | Temp 98.1°F | Ht 59.0 in | Wt 145.0 lb

## 2021-02-27 DIAGNOSIS — D509 Iron deficiency anemia, unspecified: Secondary | ICD-10-CM

## 2021-02-27 DIAGNOSIS — R350 Frequency of micturition: Secondary | ICD-10-CM

## 2021-02-27 DIAGNOSIS — I25810 Atherosclerosis of coronary artery bypass graft(s) without angina pectoris: Secondary | ICD-10-CM | POA: Diagnosis not present

## 2021-02-27 DIAGNOSIS — M4716 Other spondylosis with myelopathy, lumbar region: Secondary | ICD-10-CM | POA: Diagnosis not present

## 2021-02-27 DIAGNOSIS — I10 Essential (primary) hypertension: Secondary | ICD-10-CM

## 2021-02-27 DIAGNOSIS — K219 Gastro-esophageal reflux disease without esophagitis: Secondary | ICD-10-CM | POA: Diagnosis not present

## 2021-02-27 DIAGNOSIS — F3289 Other specified depressive episodes: Secondary | ICD-10-CM | POA: Diagnosis not present

## 2021-02-27 DIAGNOSIS — F03A0 Unspecified dementia, mild, without behavioral disturbance, psychotic disturbance, mood disturbance, and anxiety: Secondary | ICD-10-CM

## 2021-02-27 DIAGNOSIS — E1149 Type 2 diabetes mellitus with other diabetic neurological complication: Secondary | ICD-10-CM | POA: Diagnosis not present

## 2021-02-27 DIAGNOSIS — E782 Mixed hyperlipidemia: Secondary | ICD-10-CM | POA: Diagnosis not present

## 2021-02-27 LAB — URINALYSIS, ROUTINE W REFLEX MICROSCOPIC
Hgb urine dipstick: NEGATIVE
Leukocytes,Ua: NEGATIVE
Nitrite: NEGATIVE
RBC / HPF: NONE SEEN (ref 0–?)
Specific Gravity, Urine: 1.025 (ref 1.000–1.030)
Total Protein, Urine: 30 — AB
Urine Glucose: NEGATIVE
Urobilinogen, UA: 0.2 (ref 0.0–1.0)
pH: 5.5 (ref 5.0–8.0)

## 2021-02-27 LAB — LIPID PANEL
Cholesterol: 222 mg/dL — ABNORMAL HIGH (ref 0–200)
HDL: 99.4 mg/dL (ref >39.00)
LDL Cholesterol: 106 mg/dL — ABNORMAL HIGH (ref 0–99)
NonHDL: 122.52
Total CHOL/HDL Ratio: 2
Triglycerides: 82 mg/dL (ref 0.0–149.0)
VLDL: 16.4 mg/dL (ref 0.0–40.0)

## 2021-02-27 LAB — CBC WITH DIFFERENTIAL/PLATELET
Basophils Absolute: 0 10*3/uL (ref 0.0–0.1)
Basophils Relative: 0.6 % (ref 0.0–3.0)
Eosinophils Absolute: 0.3 10*3/uL (ref 0.0–0.7)
Eosinophils Relative: 6.2 % — ABNORMAL HIGH (ref 0.0–5.0)
HCT: 40.7 % (ref 36.0–46.0)
Hemoglobin: 13.1 g/dL (ref 12.0–15.0)
Lymphocytes Relative: 31.5 % (ref 12.0–46.0)
Lymphs Abs: 1.5 10*3/uL (ref 0.7–4.0)
MCHC: 32.3 g/dL (ref 30.0–36.0)
MCV: 96.8 fl (ref 78.0–100.0)
Monocytes Absolute: 0.4 10*3/uL (ref 0.1–1.0)
Monocytes Relative: 7.6 % (ref 3.0–12.0)
Neutro Abs: 2.7 10*3/uL (ref 1.4–7.7)
Neutrophils Relative %: 54.1 % (ref 43.0–77.0)
Platelets: 176 10*3/uL (ref 150.0–400.0)
RBC: 4.2 Mil/uL (ref 3.87–5.11)
RDW: 13.8 % (ref 11.5–15.5)
WBC: 4.9 10*3/uL (ref 4.0–10.5)

## 2021-02-27 LAB — HEMOGLOBIN A1C: Hgb A1c MFr Bld: 6 % (ref 4.6–6.5)

## 2021-02-27 LAB — COMPREHENSIVE METABOLIC PANEL
ALT: 14 U/L (ref 0–35)
AST: 21 U/L (ref 0–37)
Albumin: 4.3 g/dL (ref 3.5–5.2)
Alkaline Phosphatase: 84 U/L (ref 39–117)
BUN: 17 mg/dL (ref 6–23)
CO2: 30 mEq/L (ref 19–32)
Calcium: 9.3 mg/dL (ref 8.4–10.5)
Chloride: 101 mEq/L (ref 96–112)
Creatinine, Ser: 0.91 mg/dL (ref 0.40–1.20)
GFR: 56.25 mL/min — ABNORMAL LOW (ref >60.00)
Glucose, Bld: 120 mg/dL — ABNORMAL HIGH (ref 70–99)
Potassium: 4.4 mEq/L (ref 3.5–5.1)
Sodium: 137 mEq/L (ref 135–145)
Total Bilirubin: 0.9 mg/dL (ref 0.2–1.2)
Total Protein: 7.2 g/dL (ref 6.0–8.3)

## 2021-02-27 NOTE — Assessment & Plan Note (Signed)
Chronic Iron deficiency anemia Improved after starting iron daily, which she is still taking and tolerating well CBC, iron panel

## 2021-02-27 NOTE — Addendum Note (Signed)
Addended by: Jacobo Forest on: 02/27/2021 11:11 AM   Modules accepted: Orders

## 2021-02-27 NOTE — Assessment & Plan Note (Signed)
Chronic Following with Dr. Delice Lesch On Aricept 10 mg daily, Namenda 10 mg twice daily, Seroquel 50 mg at bedtime and BuSpar 15 mg twice daily Overall seems to be doing well, but does have intermittent confusion as to be expected

## 2021-02-27 NOTE — Assessment & Plan Note (Signed)
Chronic °Regular exercise and healthy diet encouraged °Check lipid panel  °Lifestyle controlled °

## 2021-02-27 NOTE — Addendum Note (Signed)
Addended by: Boris Lown B on: 02/27/2021 10:46 AM   Modules accepted: Orders

## 2021-02-27 NOTE — Assessment & Plan Note (Signed)
Chronic Sugars have been controlled with diet Check A1c  Lab Results  Component Value Date   HGBA1C 6.3 08/08/2020

## 2021-02-27 NOTE — Assessment & Plan Note (Signed)
Chronic BP well controlled Continue amlodipine 5 mg twice daily, carvedilol 6.25 mg twice daily, ramipril 10 mg twice daily cmp

## 2021-02-27 NOTE — Assessment & Plan Note (Signed)
Chronic Controlled, Stable Continue Cymbalta 60 mg daily

## 2021-02-27 NOTE — Assessment & Plan Note (Signed)
Chronic Intermittent pain Continue meloxicam 7.5 mg daily and Voltaren gel

## 2021-02-27 NOTE — Assessment & Plan Note (Signed)
Acute Has had some increased confusion recently, which could be related to her dementia, but there may be some increase in urinary frequency so we will rule out a UTI UA, urine culture

## 2021-02-27 NOTE — Assessment & Plan Note (Signed)
Chronic GERD controlled Continue omeprazole 40 mg daily 

## 2021-02-28 ENCOUNTER — Other Ambulatory Visit: Payer: Self-pay

## 2021-02-28 DIAGNOSIS — D509 Iron deficiency anemia, unspecified: Secondary | ICD-10-CM

## 2021-02-28 LAB — URINE CULTURE: Result:: NO GROWTH

## 2021-02-28 LAB — IRON,TIBC AND FERRITIN PANEL
%SAT: 47 % (calc) — ABNORMAL HIGH (ref 16–45)
Ferritin: 51 ng/mL (ref 16–288)
Iron: 149 ug/dL (ref 45–160)
TIBC: 317 mcg/dL (calc) (ref 250–450)

## 2021-03-08 ENCOUNTER — Encounter: Payer: Self-pay | Admitting: Internal Medicine

## 2021-03-08 ENCOUNTER — Other Ambulatory Visit: Payer: Self-pay | Admitting: Neurology

## 2021-03-08 DIAGNOSIS — R2689 Other abnormalities of gait and mobility: Secondary | ICD-10-CM

## 2021-03-08 DIAGNOSIS — M159 Polyosteoarthritis, unspecified: Secondary | ICD-10-CM

## 2021-03-08 DIAGNOSIS — R5381 Other malaise: Secondary | ICD-10-CM

## 2021-03-08 DIAGNOSIS — F03A0 Unspecified dementia, mild, without behavioral disturbance, psychotic disturbance, mood disturbance, and anxiety: Secondary | ICD-10-CM

## 2021-03-24 DIAGNOSIS — M6281 Muscle weakness (generalized): Secondary | ICD-10-CM | POA: Diagnosis not present

## 2021-03-24 DIAGNOSIS — M5459 Other low back pain: Secondary | ICD-10-CM | POA: Diagnosis not present

## 2021-03-24 DIAGNOSIS — R488 Other symbolic dysfunctions: Secondary | ICD-10-CM | POA: Diagnosis not present

## 2021-03-24 DIAGNOSIS — R41841 Cognitive communication deficit: Secondary | ICD-10-CM | POA: Diagnosis not present

## 2021-03-24 DIAGNOSIS — R2689 Other abnormalities of gait and mobility: Secondary | ICD-10-CM | POA: Diagnosis not present

## 2021-03-24 DIAGNOSIS — R278 Other lack of coordination: Secondary | ICD-10-CM | POA: Diagnosis not present

## 2021-03-26 DIAGNOSIS — R278 Other lack of coordination: Secondary | ICD-10-CM | POA: Diagnosis not present

## 2021-03-26 DIAGNOSIS — M6281 Muscle weakness (generalized): Secondary | ICD-10-CM | POA: Diagnosis not present

## 2021-03-26 DIAGNOSIS — M5459 Other low back pain: Secondary | ICD-10-CM | POA: Diagnosis not present

## 2021-03-26 DIAGNOSIS — R41841 Cognitive communication deficit: Secondary | ICD-10-CM | POA: Diagnosis not present

## 2021-03-26 DIAGNOSIS — R2689 Other abnormalities of gait and mobility: Secondary | ICD-10-CM | POA: Diagnosis not present

## 2021-03-26 DIAGNOSIS — R488 Other symbolic dysfunctions: Secondary | ICD-10-CM | POA: Diagnosis not present

## 2021-03-28 DIAGNOSIS — R41841 Cognitive communication deficit: Secondary | ICD-10-CM | POA: Diagnosis not present

## 2021-03-28 DIAGNOSIS — R278 Other lack of coordination: Secondary | ICD-10-CM | POA: Diagnosis not present

## 2021-03-28 DIAGNOSIS — M6281 Muscle weakness (generalized): Secondary | ICD-10-CM | POA: Diagnosis not present

## 2021-03-28 DIAGNOSIS — R488 Other symbolic dysfunctions: Secondary | ICD-10-CM | POA: Diagnosis not present

## 2021-03-28 DIAGNOSIS — M5459 Other low back pain: Secondary | ICD-10-CM | POA: Diagnosis not present

## 2021-03-28 DIAGNOSIS — R2689 Other abnormalities of gait and mobility: Secondary | ICD-10-CM | POA: Diagnosis not present

## 2021-03-29 DIAGNOSIS — R2689 Other abnormalities of gait and mobility: Secondary | ICD-10-CM | POA: Diagnosis not present

## 2021-03-29 DIAGNOSIS — M6281 Muscle weakness (generalized): Secondary | ICD-10-CM | POA: Diagnosis not present

## 2021-03-29 DIAGNOSIS — R41841 Cognitive communication deficit: Secondary | ICD-10-CM | POA: Diagnosis not present

## 2021-03-29 DIAGNOSIS — M5459 Other low back pain: Secondary | ICD-10-CM | POA: Diagnosis not present

## 2021-03-29 DIAGNOSIS — R278 Other lack of coordination: Secondary | ICD-10-CM | POA: Diagnosis not present

## 2021-03-29 DIAGNOSIS — R488 Other symbolic dysfunctions: Secondary | ICD-10-CM | POA: Diagnosis not present

## 2021-04-01 DIAGNOSIS — M5459 Other low back pain: Secondary | ICD-10-CM | POA: Diagnosis not present

## 2021-04-01 DIAGNOSIS — R278 Other lack of coordination: Secondary | ICD-10-CM | POA: Diagnosis not present

## 2021-04-01 DIAGNOSIS — M6281 Muscle weakness (generalized): Secondary | ICD-10-CM | POA: Diagnosis not present

## 2021-04-01 DIAGNOSIS — R41841 Cognitive communication deficit: Secondary | ICD-10-CM | POA: Diagnosis not present

## 2021-04-01 DIAGNOSIS — R488 Other symbolic dysfunctions: Secondary | ICD-10-CM | POA: Diagnosis not present

## 2021-04-01 DIAGNOSIS — R2689 Other abnormalities of gait and mobility: Secondary | ICD-10-CM | POA: Diagnosis not present

## 2021-04-03 DIAGNOSIS — R41841 Cognitive communication deficit: Secondary | ICD-10-CM | POA: Diagnosis not present

## 2021-04-03 DIAGNOSIS — R488 Other symbolic dysfunctions: Secondary | ICD-10-CM | POA: Diagnosis not present

## 2021-04-03 DIAGNOSIS — M6281 Muscle weakness (generalized): Secondary | ICD-10-CM | POA: Diagnosis not present

## 2021-04-03 DIAGNOSIS — R2689 Other abnormalities of gait and mobility: Secondary | ICD-10-CM | POA: Diagnosis not present

## 2021-04-03 DIAGNOSIS — M5459 Other low back pain: Secondary | ICD-10-CM | POA: Diagnosis not present

## 2021-04-03 DIAGNOSIS — R278 Other lack of coordination: Secondary | ICD-10-CM | POA: Diagnosis not present

## 2021-04-04 DIAGNOSIS — R2689 Other abnormalities of gait and mobility: Secondary | ICD-10-CM | POA: Diagnosis not present

## 2021-04-04 DIAGNOSIS — R278 Other lack of coordination: Secondary | ICD-10-CM | POA: Diagnosis not present

## 2021-04-04 DIAGNOSIS — R41841 Cognitive communication deficit: Secondary | ICD-10-CM | POA: Diagnosis not present

## 2021-04-04 DIAGNOSIS — M6281 Muscle weakness (generalized): Secondary | ICD-10-CM | POA: Diagnosis not present

## 2021-04-04 DIAGNOSIS — R488 Other symbolic dysfunctions: Secondary | ICD-10-CM | POA: Diagnosis not present

## 2021-04-04 DIAGNOSIS — M5459 Other low back pain: Secondary | ICD-10-CM | POA: Diagnosis not present

## 2021-04-07 DIAGNOSIS — R278 Other lack of coordination: Secondary | ICD-10-CM | POA: Diagnosis not present

## 2021-04-07 DIAGNOSIS — R2689 Other abnormalities of gait and mobility: Secondary | ICD-10-CM | POA: Diagnosis not present

## 2021-04-07 DIAGNOSIS — R41841 Cognitive communication deficit: Secondary | ICD-10-CM | POA: Diagnosis not present

## 2021-04-07 DIAGNOSIS — M5459 Other low back pain: Secondary | ICD-10-CM | POA: Diagnosis not present

## 2021-04-07 DIAGNOSIS — R488 Other symbolic dysfunctions: Secondary | ICD-10-CM | POA: Diagnosis not present

## 2021-04-07 DIAGNOSIS — M6281 Muscle weakness (generalized): Secondary | ICD-10-CM | POA: Diagnosis not present

## 2021-04-08 DIAGNOSIS — R2689 Other abnormalities of gait and mobility: Secondary | ICD-10-CM | POA: Diagnosis not present

## 2021-04-08 DIAGNOSIS — R488 Other symbolic dysfunctions: Secondary | ICD-10-CM | POA: Diagnosis not present

## 2021-04-08 DIAGNOSIS — M6281 Muscle weakness (generalized): Secondary | ICD-10-CM | POA: Diagnosis not present

## 2021-04-08 DIAGNOSIS — M5459 Other low back pain: Secondary | ICD-10-CM | POA: Diagnosis not present

## 2021-04-08 DIAGNOSIS — R41841 Cognitive communication deficit: Secondary | ICD-10-CM | POA: Diagnosis not present

## 2021-04-08 DIAGNOSIS — R278 Other lack of coordination: Secondary | ICD-10-CM | POA: Diagnosis not present

## 2021-04-10 DIAGNOSIS — R2689 Other abnormalities of gait and mobility: Secondary | ICD-10-CM | POA: Diagnosis not present

## 2021-04-10 DIAGNOSIS — R278 Other lack of coordination: Secondary | ICD-10-CM | POA: Diagnosis not present

## 2021-04-10 DIAGNOSIS — R488 Other symbolic dysfunctions: Secondary | ICD-10-CM | POA: Diagnosis not present

## 2021-04-10 DIAGNOSIS — M6281 Muscle weakness (generalized): Secondary | ICD-10-CM | POA: Diagnosis not present

## 2021-04-10 DIAGNOSIS — M5459 Other low back pain: Secondary | ICD-10-CM | POA: Diagnosis not present

## 2021-04-10 DIAGNOSIS — R41841 Cognitive communication deficit: Secondary | ICD-10-CM | POA: Diagnosis not present

## 2021-04-11 DIAGNOSIS — M6281 Muscle weakness (generalized): Secondary | ICD-10-CM | POA: Diagnosis not present

## 2021-04-11 DIAGNOSIS — M5459 Other low back pain: Secondary | ICD-10-CM | POA: Diagnosis not present

## 2021-04-11 DIAGNOSIS — R41841 Cognitive communication deficit: Secondary | ICD-10-CM | POA: Diagnosis not present

## 2021-04-11 DIAGNOSIS — R278 Other lack of coordination: Secondary | ICD-10-CM | POA: Diagnosis not present

## 2021-04-11 DIAGNOSIS — R488 Other symbolic dysfunctions: Secondary | ICD-10-CM | POA: Diagnosis not present

## 2021-04-11 DIAGNOSIS — R2689 Other abnormalities of gait and mobility: Secondary | ICD-10-CM | POA: Diagnosis not present

## 2021-04-15 DIAGNOSIS — M6281 Muscle weakness (generalized): Secondary | ICD-10-CM | POA: Diagnosis not present

## 2021-04-15 DIAGNOSIS — R41841 Cognitive communication deficit: Secondary | ICD-10-CM | POA: Diagnosis not present

## 2021-04-15 DIAGNOSIS — R488 Other symbolic dysfunctions: Secondary | ICD-10-CM | POA: Diagnosis not present

## 2021-04-15 DIAGNOSIS — R2689 Other abnormalities of gait and mobility: Secondary | ICD-10-CM | POA: Diagnosis not present

## 2021-04-15 DIAGNOSIS — R278 Other lack of coordination: Secondary | ICD-10-CM | POA: Diagnosis not present

## 2021-04-15 DIAGNOSIS — Z20822 Contact with and (suspected) exposure to covid-19: Secondary | ICD-10-CM | POA: Diagnosis not present

## 2021-04-15 DIAGNOSIS — M5459 Other low back pain: Secondary | ICD-10-CM | POA: Diagnosis not present

## 2021-04-16 DIAGNOSIS — M6281 Muscle weakness (generalized): Secondary | ICD-10-CM | POA: Diagnosis not present

## 2021-04-16 DIAGNOSIS — R488 Other symbolic dysfunctions: Secondary | ICD-10-CM | POA: Diagnosis not present

## 2021-04-16 DIAGNOSIS — R41841 Cognitive communication deficit: Secondary | ICD-10-CM | POA: Diagnosis not present

## 2021-04-16 DIAGNOSIS — M5459 Other low back pain: Secondary | ICD-10-CM | POA: Diagnosis not present

## 2021-04-16 DIAGNOSIS — R278 Other lack of coordination: Secondary | ICD-10-CM | POA: Diagnosis not present

## 2021-04-16 DIAGNOSIS — R2689 Other abnormalities of gait and mobility: Secondary | ICD-10-CM | POA: Diagnosis not present

## 2021-04-17 DIAGNOSIS — M5459 Other low back pain: Secondary | ICD-10-CM | POA: Diagnosis not present

## 2021-04-17 DIAGNOSIS — M6281 Muscle weakness (generalized): Secondary | ICD-10-CM | POA: Diagnosis not present

## 2021-04-17 DIAGNOSIS — R41841 Cognitive communication deficit: Secondary | ICD-10-CM | POA: Diagnosis not present

## 2021-04-17 DIAGNOSIS — R2689 Other abnormalities of gait and mobility: Secondary | ICD-10-CM | POA: Diagnosis not present

## 2021-04-17 DIAGNOSIS — R488 Other symbolic dysfunctions: Secondary | ICD-10-CM | POA: Diagnosis not present

## 2021-04-17 DIAGNOSIS — R278 Other lack of coordination: Secondary | ICD-10-CM | POA: Diagnosis not present

## 2021-04-18 DIAGNOSIS — R41841 Cognitive communication deficit: Secondary | ICD-10-CM | POA: Diagnosis not present

## 2021-04-18 DIAGNOSIS — M6281 Muscle weakness (generalized): Secondary | ICD-10-CM | POA: Diagnosis not present

## 2021-04-18 DIAGNOSIS — R2689 Other abnormalities of gait and mobility: Secondary | ICD-10-CM | POA: Diagnosis not present

## 2021-04-18 DIAGNOSIS — M5459 Other low back pain: Secondary | ICD-10-CM | POA: Diagnosis not present

## 2021-04-18 DIAGNOSIS — R488 Other symbolic dysfunctions: Secondary | ICD-10-CM | POA: Diagnosis not present

## 2021-04-18 DIAGNOSIS — R278 Other lack of coordination: Secondary | ICD-10-CM | POA: Diagnosis not present

## 2021-04-22 DIAGNOSIS — R2689 Other abnormalities of gait and mobility: Secondary | ICD-10-CM | POA: Diagnosis not present

## 2021-04-22 DIAGNOSIS — R41841 Cognitive communication deficit: Secondary | ICD-10-CM | POA: Diagnosis not present

## 2021-04-22 DIAGNOSIS — R488 Other symbolic dysfunctions: Secondary | ICD-10-CM | POA: Diagnosis not present

## 2021-04-22 DIAGNOSIS — M5459 Other low back pain: Secondary | ICD-10-CM | POA: Diagnosis not present

## 2021-04-22 DIAGNOSIS — M6281 Muscle weakness (generalized): Secondary | ICD-10-CM | POA: Diagnosis not present

## 2021-04-22 DIAGNOSIS — R278 Other lack of coordination: Secondary | ICD-10-CM | POA: Diagnosis not present

## 2021-04-23 DIAGNOSIS — M5459 Other low back pain: Secondary | ICD-10-CM | POA: Diagnosis not present

## 2021-04-23 DIAGNOSIS — R2689 Other abnormalities of gait and mobility: Secondary | ICD-10-CM | POA: Diagnosis not present

## 2021-04-23 DIAGNOSIS — R41841 Cognitive communication deficit: Secondary | ICD-10-CM | POA: Diagnosis not present

## 2021-04-23 DIAGNOSIS — M6281 Muscle weakness (generalized): Secondary | ICD-10-CM | POA: Diagnosis not present

## 2021-04-23 DIAGNOSIS — R488 Other symbolic dysfunctions: Secondary | ICD-10-CM | POA: Diagnosis not present

## 2021-04-23 DIAGNOSIS — R278 Other lack of coordination: Secondary | ICD-10-CM | POA: Diagnosis not present

## 2021-04-24 DIAGNOSIS — R41841 Cognitive communication deficit: Secondary | ICD-10-CM | POA: Diagnosis not present

## 2021-04-24 DIAGNOSIS — R278 Other lack of coordination: Secondary | ICD-10-CM | POA: Diagnosis not present

## 2021-04-24 DIAGNOSIS — R2689 Other abnormalities of gait and mobility: Secondary | ICD-10-CM | POA: Diagnosis not present

## 2021-04-24 DIAGNOSIS — R488 Other symbolic dysfunctions: Secondary | ICD-10-CM | POA: Diagnosis not present

## 2021-04-24 DIAGNOSIS — M5459 Other low back pain: Secondary | ICD-10-CM | POA: Diagnosis not present

## 2021-04-24 DIAGNOSIS — M6281 Muscle weakness (generalized): Secondary | ICD-10-CM | POA: Diagnosis not present

## 2021-04-25 DIAGNOSIS — R488 Other symbolic dysfunctions: Secondary | ICD-10-CM | POA: Diagnosis not present

## 2021-04-25 DIAGNOSIS — R41841 Cognitive communication deficit: Secondary | ICD-10-CM | POA: Diagnosis not present

## 2021-04-25 DIAGNOSIS — R2689 Other abnormalities of gait and mobility: Secondary | ICD-10-CM | POA: Diagnosis not present

## 2021-04-25 DIAGNOSIS — R278 Other lack of coordination: Secondary | ICD-10-CM | POA: Diagnosis not present

## 2021-04-25 DIAGNOSIS — M5459 Other low back pain: Secondary | ICD-10-CM | POA: Diagnosis not present

## 2021-04-25 DIAGNOSIS — M6281 Muscle weakness (generalized): Secondary | ICD-10-CM | POA: Diagnosis not present

## 2021-04-28 DIAGNOSIS — R2689 Other abnormalities of gait and mobility: Secondary | ICD-10-CM | POA: Diagnosis not present

## 2021-04-28 DIAGNOSIS — R41841 Cognitive communication deficit: Secondary | ICD-10-CM | POA: Diagnosis not present

## 2021-04-28 DIAGNOSIS — R488 Other symbolic dysfunctions: Secondary | ICD-10-CM | POA: Diagnosis not present

## 2021-04-28 DIAGNOSIS — M5459 Other low back pain: Secondary | ICD-10-CM | POA: Diagnosis not present

## 2021-04-28 DIAGNOSIS — M6281 Muscle weakness (generalized): Secondary | ICD-10-CM | POA: Diagnosis not present

## 2021-04-28 DIAGNOSIS — R278 Other lack of coordination: Secondary | ICD-10-CM | POA: Diagnosis not present

## 2021-04-29 DIAGNOSIS — R488 Other symbolic dysfunctions: Secondary | ICD-10-CM | POA: Diagnosis not present

## 2021-04-29 DIAGNOSIS — M6281 Muscle weakness (generalized): Secondary | ICD-10-CM | POA: Diagnosis not present

## 2021-04-29 DIAGNOSIS — R2689 Other abnormalities of gait and mobility: Secondary | ICD-10-CM | POA: Diagnosis not present

## 2021-04-29 DIAGNOSIS — R278 Other lack of coordination: Secondary | ICD-10-CM | POA: Diagnosis not present

## 2021-04-29 DIAGNOSIS — M5459 Other low back pain: Secondary | ICD-10-CM | POA: Diagnosis not present

## 2021-04-29 DIAGNOSIS — R41841 Cognitive communication deficit: Secondary | ICD-10-CM | POA: Diagnosis not present

## 2021-04-30 DIAGNOSIS — M6281 Muscle weakness (generalized): Secondary | ICD-10-CM | POA: Diagnosis not present

## 2021-04-30 DIAGNOSIS — R488 Other symbolic dysfunctions: Secondary | ICD-10-CM | POA: Diagnosis not present

## 2021-04-30 DIAGNOSIS — R41841 Cognitive communication deficit: Secondary | ICD-10-CM | POA: Diagnosis not present

## 2021-04-30 DIAGNOSIS — M5459 Other low back pain: Secondary | ICD-10-CM | POA: Diagnosis not present

## 2021-04-30 DIAGNOSIS — R2689 Other abnormalities of gait and mobility: Secondary | ICD-10-CM | POA: Diagnosis not present

## 2021-04-30 DIAGNOSIS — R278 Other lack of coordination: Secondary | ICD-10-CM | POA: Diagnosis not present

## 2021-05-01 DIAGNOSIS — M6281 Muscle weakness (generalized): Secondary | ICD-10-CM | POA: Diagnosis not present

## 2021-05-01 DIAGNOSIS — R278 Other lack of coordination: Secondary | ICD-10-CM | POA: Diagnosis not present

## 2021-05-01 DIAGNOSIS — R2689 Other abnormalities of gait and mobility: Secondary | ICD-10-CM | POA: Diagnosis not present

## 2021-05-01 DIAGNOSIS — M5459 Other low back pain: Secondary | ICD-10-CM | POA: Diagnosis not present

## 2021-05-01 DIAGNOSIS — R488 Other symbolic dysfunctions: Secondary | ICD-10-CM | POA: Diagnosis not present

## 2021-05-01 DIAGNOSIS — R41841 Cognitive communication deficit: Secondary | ICD-10-CM | POA: Diagnosis not present

## 2021-05-05 DIAGNOSIS — M6281 Muscle weakness (generalized): Secondary | ICD-10-CM | POA: Diagnosis not present

## 2021-05-05 DIAGNOSIS — R2689 Other abnormalities of gait and mobility: Secondary | ICD-10-CM | POA: Diagnosis not present

## 2021-05-05 DIAGNOSIS — R41841 Cognitive communication deficit: Secondary | ICD-10-CM | POA: Diagnosis not present

## 2021-05-05 DIAGNOSIS — R278 Other lack of coordination: Secondary | ICD-10-CM | POA: Diagnosis not present

## 2021-05-05 DIAGNOSIS — R488 Other symbolic dysfunctions: Secondary | ICD-10-CM | POA: Diagnosis not present

## 2021-05-05 DIAGNOSIS — M5459 Other low back pain: Secondary | ICD-10-CM | POA: Diagnosis not present

## 2021-05-08 DIAGNOSIS — M6281 Muscle weakness (generalized): Secondary | ICD-10-CM | POA: Diagnosis not present

## 2021-05-08 DIAGNOSIS — R41841 Cognitive communication deficit: Secondary | ICD-10-CM | POA: Diagnosis not present

## 2021-05-08 DIAGNOSIS — L603 Nail dystrophy: Secondary | ICD-10-CM | POA: Diagnosis not present

## 2021-05-08 DIAGNOSIS — E1151 Type 2 diabetes mellitus with diabetic peripheral angiopathy without gangrene: Secondary | ICD-10-CM | POA: Diagnosis not present

## 2021-05-08 DIAGNOSIS — R278 Other lack of coordination: Secondary | ICD-10-CM | POA: Diagnosis not present

## 2021-05-08 DIAGNOSIS — R2689 Other abnormalities of gait and mobility: Secondary | ICD-10-CM | POA: Diagnosis not present

## 2021-05-08 DIAGNOSIS — R488 Other symbolic dysfunctions: Secondary | ICD-10-CM | POA: Diagnosis not present

## 2021-05-08 DIAGNOSIS — L84 Corns and callosities: Secondary | ICD-10-CM | POA: Diagnosis not present

## 2021-05-08 DIAGNOSIS — M5459 Other low back pain: Secondary | ICD-10-CM | POA: Diagnosis not present

## 2021-05-08 DIAGNOSIS — I739 Peripheral vascular disease, unspecified: Secondary | ICD-10-CM | POA: Diagnosis not present

## 2021-05-09 DIAGNOSIS — R278 Other lack of coordination: Secondary | ICD-10-CM | POA: Diagnosis not present

## 2021-05-09 DIAGNOSIS — R488 Other symbolic dysfunctions: Secondary | ICD-10-CM | POA: Diagnosis not present

## 2021-05-09 DIAGNOSIS — R41841 Cognitive communication deficit: Secondary | ICD-10-CM | POA: Diagnosis not present

## 2021-05-09 DIAGNOSIS — M6281 Muscle weakness (generalized): Secondary | ICD-10-CM | POA: Diagnosis not present

## 2021-05-09 DIAGNOSIS — M5459 Other low back pain: Secondary | ICD-10-CM | POA: Diagnosis not present

## 2021-05-09 DIAGNOSIS — R2689 Other abnormalities of gait and mobility: Secondary | ICD-10-CM | POA: Diagnosis not present

## 2021-05-12 DIAGNOSIS — R41841 Cognitive communication deficit: Secondary | ICD-10-CM | POA: Diagnosis not present

## 2021-05-12 DIAGNOSIS — R2689 Other abnormalities of gait and mobility: Secondary | ICD-10-CM | POA: Diagnosis not present

## 2021-05-12 DIAGNOSIS — M5459 Other low back pain: Secondary | ICD-10-CM | POA: Diagnosis not present

## 2021-05-12 DIAGNOSIS — M6281 Muscle weakness (generalized): Secondary | ICD-10-CM | POA: Diagnosis not present

## 2021-05-12 DIAGNOSIS — R278 Other lack of coordination: Secondary | ICD-10-CM | POA: Diagnosis not present

## 2021-05-12 DIAGNOSIS — R488 Other symbolic dysfunctions: Secondary | ICD-10-CM | POA: Diagnosis not present

## 2021-05-13 DIAGNOSIS — R488 Other symbolic dysfunctions: Secondary | ICD-10-CM | POA: Diagnosis not present

## 2021-05-13 DIAGNOSIS — R41841 Cognitive communication deficit: Secondary | ICD-10-CM | POA: Diagnosis not present

## 2021-05-13 DIAGNOSIS — R278 Other lack of coordination: Secondary | ICD-10-CM | POA: Diagnosis not present

## 2021-05-13 DIAGNOSIS — R2689 Other abnormalities of gait and mobility: Secondary | ICD-10-CM | POA: Diagnosis not present

## 2021-05-13 DIAGNOSIS — M5459 Other low back pain: Secondary | ICD-10-CM | POA: Diagnosis not present

## 2021-05-13 DIAGNOSIS — M6281 Muscle weakness (generalized): Secondary | ICD-10-CM | POA: Diagnosis not present

## 2021-05-14 DIAGNOSIS — R278 Other lack of coordination: Secondary | ICD-10-CM | POA: Diagnosis not present

## 2021-05-14 DIAGNOSIS — M5459 Other low back pain: Secondary | ICD-10-CM | POA: Diagnosis not present

## 2021-05-14 DIAGNOSIS — R41841 Cognitive communication deficit: Secondary | ICD-10-CM | POA: Diagnosis not present

## 2021-05-14 DIAGNOSIS — M6281 Muscle weakness (generalized): Secondary | ICD-10-CM | POA: Diagnosis not present

## 2021-05-14 DIAGNOSIS — R2689 Other abnormalities of gait and mobility: Secondary | ICD-10-CM | POA: Diagnosis not present

## 2021-05-14 DIAGNOSIS — R488 Other symbolic dysfunctions: Secondary | ICD-10-CM | POA: Diagnosis not present

## 2021-05-15 DIAGNOSIS — R488 Other symbolic dysfunctions: Secondary | ICD-10-CM | POA: Diagnosis not present

## 2021-05-15 DIAGNOSIS — R278 Other lack of coordination: Secondary | ICD-10-CM | POA: Diagnosis not present

## 2021-05-15 DIAGNOSIS — M6281 Muscle weakness (generalized): Secondary | ICD-10-CM | POA: Diagnosis not present

## 2021-05-15 DIAGNOSIS — R2689 Other abnormalities of gait and mobility: Secondary | ICD-10-CM | POA: Diagnosis not present

## 2021-05-15 DIAGNOSIS — R41841 Cognitive communication deficit: Secondary | ICD-10-CM | POA: Diagnosis not present

## 2021-05-15 DIAGNOSIS — M5459 Other low back pain: Secondary | ICD-10-CM | POA: Diagnosis not present

## 2021-05-16 DIAGNOSIS — M5459 Other low back pain: Secondary | ICD-10-CM | POA: Diagnosis not present

## 2021-05-16 DIAGNOSIS — R488 Other symbolic dysfunctions: Secondary | ICD-10-CM | POA: Diagnosis not present

## 2021-05-16 DIAGNOSIS — M6281 Muscle weakness (generalized): Secondary | ICD-10-CM | POA: Diagnosis not present

## 2021-05-16 DIAGNOSIS — R41841 Cognitive communication deficit: Secondary | ICD-10-CM | POA: Diagnosis not present

## 2021-05-16 DIAGNOSIS — R278 Other lack of coordination: Secondary | ICD-10-CM | POA: Diagnosis not present

## 2021-05-16 DIAGNOSIS — R2689 Other abnormalities of gait and mobility: Secondary | ICD-10-CM | POA: Diagnosis not present

## 2021-05-19 DIAGNOSIS — R488 Other symbolic dysfunctions: Secondary | ICD-10-CM | POA: Diagnosis not present

## 2021-05-19 DIAGNOSIS — R41841 Cognitive communication deficit: Secondary | ICD-10-CM | POA: Diagnosis not present

## 2021-05-19 DIAGNOSIS — R278 Other lack of coordination: Secondary | ICD-10-CM | POA: Diagnosis not present

## 2021-05-19 DIAGNOSIS — M6281 Muscle weakness (generalized): Secondary | ICD-10-CM | POA: Diagnosis not present

## 2021-05-19 DIAGNOSIS — M5459 Other low back pain: Secondary | ICD-10-CM | POA: Diagnosis not present

## 2021-05-19 DIAGNOSIS — R2689 Other abnormalities of gait and mobility: Secondary | ICD-10-CM | POA: Diagnosis not present

## 2021-05-20 DIAGNOSIS — R278 Other lack of coordination: Secondary | ICD-10-CM | POA: Diagnosis not present

## 2021-05-20 DIAGNOSIS — M6281 Muscle weakness (generalized): Secondary | ICD-10-CM | POA: Diagnosis not present

## 2021-05-20 DIAGNOSIS — R488 Other symbolic dysfunctions: Secondary | ICD-10-CM | POA: Diagnosis not present

## 2021-05-20 DIAGNOSIS — R2689 Other abnormalities of gait and mobility: Secondary | ICD-10-CM | POA: Diagnosis not present

## 2021-05-20 DIAGNOSIS — R41841 Cognitive communication deficit: Secondary | ICD-10-CM | POA: Diagnosis not present

## 2021-05-20 DIAGNOSIS — M5459 Other low back pain: Secondary | ICD-10-CM | POA: Diagnosis not present

## 2021-05-21 DIAGNOSIS — R488 Other symbolic dysfunctions: Secondary | ICD-10-CM | POA: Diagnosis not present

## 2021-05-21 DIAGNOSIS — M6281 Muscle weakness (generalized): Secondary | ICD-10-CM | POA: Diagnosis not present

## 2021-05-21 DIAGNOSIS — R2689 Other abnormalities of gait and mobility: Secondary | ICD-10-CM | POA: Diagnosis not present

## 2021-05-21 DIAGNOSIS — M5459 Other low back pain: Secondary | ICD-10-CM | POA: Diagnosis not present

## 2021-05-21 DIAGNOSIS — R41841 Cognitive communication deficit: Secondary | ICD-10-CM | POA: Diagnosis not present

## 2021-05-22 ENCOUNTER — Ambulatory Visit: Payer: Medicare Other | Admitting: Internal Medicine

## 2021-05-22 DIAGNOSIS — M6281 Muscle weakness (generalized): Secondary | ICD-10-CM | POA: Diagnosis not present

## 2021-05-22 DIAGNOSIS — R488 Other symbolic dysfunctions: Secondary | ICD-10-CM | POA: Diagnosis not present

## 2021-05-22 DIAGNOSIS — R2689 Other abnormalities of gait and mobility: Secondary | ICD-10-CM | POA: Diagnosis not present

## 2021-05-22 DIAGNOSIS — M5459 Other low back pain: Secondary | ICD-10-CM | POA: Diagnosis not present

## 2021-05-22 DIAGNOSIS — R41841 Cognitive communication deficit: Secondary | ICD-10-CM | POA: Diagnosis not present

## 2021-05-23 DIAGNOSIS — M5459 Other low back pain: Secondary | ICD-10-CM | POA: Diagnosis not present

## 2021-05-23 DIAGNOSIS — M6281 Muscle weakness (generalized): Secondary | ICD-10-CM | POA: Diagnosis not present

## 2021-05-23 DIAGNOSIS — R2689 Other abnormalities of gait and mobility: Secondary | ICD-10-CM | POA: Diagnosis not present

## 2021-05-23 DIAGNOSIS — R41841 Cognitive communication deficit: Secondary | ICD-10-CM | POA: Diagnosis not present

## 2021-05-23 DIAGNOSIS — R488 Other symbolic dysfunctions: Secondary | ICD-10-CM | POA: Diagnosis not present

## 2021-05-26 DIAGNOSIS — M6281 Muscle weakness (generalized): Secondary | ICD-10-CM | POA: Diagnosis not present

## 2021-05-26 DIAGNOSIS — R41841 Cognitive communication deficit: Secondary | ICD-10-CM | POA: Diagnosis not present

## 2021-05-26 DIAGNOSIS — R488 Other symbolic dysfunctions: Secondary | ICD-10-CM | POA: Diagnosis not present

## 2021-05-26 DIAGNOSIS — M5459 Other low back pain: Secondary | ICD-10-CM | POA: Diagnosis not present

## 2021-05-26 DIAGNOSIS — R2689 Other abnormalities of gait and mobility: Secondary | ICD-10-CM | POA: Diagnosis not present

## 2021-05-27 DIAGNOSIS — R488 Other symbolic dysfunctions: Secondary | ICD-10-CM | POA: Diagnosis not present

## 2021-05-27 DIAGNOSIS — R2689 Other abnormalities of gait and mobility: Secondary | ICD-10-CM | POA: Diagnosis not present

## 2021-05-27 DIAGNOSIS — R41841 Cognitive communication deficit: Secondary | ICD-10-CM | POA: Diagnosis not present

## 2021-05-27 DIAGNOSIS — M5459 Other low back pain: Secondary | ICD-10-CM | POA: Diagnosis not present

## 2021-05-27 DIAGNOSIS — M6281 Muscle weakness (generalized): Secondary | ICD-10-CM | POA: Diagnosis not present

## 2021-05-28 DIAGNOSIS — M5459 Other low back pain: Secondary | ICD-10-CM | POA: Diagnosis not present

## 2021-05-28 DIAGNOSIS — R488 Other symbolic dysfunctions: Secondary | ICD-10-CM | POA: Diagnosis not present

## 2021-05-28 DIAGNOSIS — M6281 Muscle weakness (generalized): Secondary | ICD-10-CM | POA: Diagnosis not present

## 2021-05-28 DIAGNOSIS — R41841 Cognitive communication deficit: Secondary | ICD-10-CM | POA: Diagnosis not present

## 2021-05-28 DIAGNOSIS — R2689 Other abnormalities of gait and mobility: Secondary | ICD-10-CM | POA: Diagnosis not present

## 2021-05-29 DIAGNOSIS — M6281 Muscle weakness (generalized): Secondary | ICD-10-CM | POA: Diagnosis not present

## 2021-05-29 DIAGNOSIS — R488 Other symbolic dysfunctions: Secondary | ICD-10-CM | POA: Diagnosis not present

## 2021-05-29 DIAGNOSIS — R2689 Other abnormalities of gait and mobility: Secondary | ICD-10-CM | POA: Diagnosis not present

## 2021-05-29 DIAGNOSIS — M5459 Other low back pain: Secondary | ICD-10-CM | POA: Diagnosis not present

## 2021-05-29 DIAGNOSIS — R41841 Cognitive communication deficit: Secondary | ICD-10-CM | POA: Diagnosis not present

## 2021-05-30 DIAGNOSIS — R488 Other symbolic dysfunctions: Secondary | ICD-10-CM | POA: Diagnosis not present

## 2021-05-30 DIAGNOSIS — M5459 Other low back pain: Secondary | ICD-10-CM | POA: Diagnosis not present

## 2021-05-30 DIAGNOSIS — R41841 Cognitive communication deficit: Secondary | ICD-10-CM | POA: Diagnosis not present

## 2021-05-30 DIAGNOSIS — R2689 Other abnormalities of gait and mobility: Secondary | ICD-10-CM | POA: Diagnosis not present

## 2021-05-30 DIAGNOSIS — M6281 Muscle weakness (generalized): Secondary | ICD-10-CM | POA: Diagnosis not present

## 2021-06-02 DIAGNOSIS — R41841 Cognitive communication deficit: Secondary | ICD-10-CM | POA: Diagnosis not present

## 2021-06-02 DIAGNOSIS — M5459 Other low back pain: Secondary | ICD-10-CM | POA: Diagnosis not present

## 2021-06-02 DIAGNOSIS — R488 Other symbolic dysfunctions: Secondary | ICD-10-CM | POA: Diagnosis not present

## 2021-06-02 DIAGNOSIS — R2689 Other abnormalities of gait and mobility: Secondary | ICD-10-CM | POA: Diagnosis not present

## 2021-06-02 DIAGNOSIS — M6281 Muscle weakness (generalized): Secondary | ICD-10-CM | POA: Diagnosis not present

## 2021-06-03 DIAGNOSIS — R488 Other symbolic dysfunctions: Secondary | ICD-10-CM | POA: Diagnosis not present

## 2021-06-03 DIAGNOSIS — R2689 Other abnormalities of gait and mobility: Secondary | ICD-10-CM | POA: Diagnosis not present

## 2021-06-03 DIAGNOSIS — M6281 Muscle weakness (generalized): Secondary | ICD-10-CM | POA: Diagnosis not present

## 2021-06-03 DIAGNOSIS — R41841 Cognitive communication deficit: Secondary | ICD-10-CM | POA: Diagnosis not present

## 2021-06-03 DIAGNOSIS — M5459 Other low back pain: Secondary | ICD-10-CM | POA: Diagnosis not present

## 2021-06-04 DIAGNOSIS — Z20822 Contact with and (suspected) exposure to covid-19: Secondary | ICD-10-CM | POA: Diagnosis not present

## 2021-06-06 DIAGNOSIS — M6281 Muscle weakness (generalized): Secondary | ICD-10-CM | POA: Diagnosis not present

## 2021-06-06 DIAGNOSIS — M5459 Other low back pain: Secondary | ICD-10-CM | POA: Diagnosis not present

## 2021-06-06 DIAGNOSIS — R488 Other symbolic dysfunctions: Secondary | ICD-10-CM | POA: Diagnosis not present

## 2021-06-06 DIAGNOSIS — R41841 Cognitive communication deficit: Secondary | ICD-10-CM | POA: Diagnosis not present

## 2021-06-06 DIAGNOSIS — R2689 Other abnormalities of gait and mobility: Secondary | ICD-10-CM | POA: Diagnosis not present

## 2021-06-10 DIAGNOSIS — M6281 Muscle weakness (generalized): Secondary | ICD-10-CM | POA: Diagnosis not present

## 2021-06-10 DIAGNOSIS — R41841 Cognitive communication deficit: Secondary | ICD-10-CM | POA: Diagnosis not present

## 2021-06-10 DIAGNOSIS — R2689 Other abnormalities of gait and mobility: Secondary | ICD-10-CM | POA: Diagnosis not present

## 2021-06-10 DIAGNOSIS — M5459 Other low back pain: Secondary | ICD-10-CM | POA: Diagnosis not present

## 2021-06-10 DIAGNOSIS — R488 Other symbolic dysfunctions: Secondary | ICD-10-CM | POA: Diagnosis not present

## 2021-06-13 DIAGNOSIS — R2689 Other abnormalities of gait and mobility: Secondary | ICD-10-CM | POA: Diagnosis not present

## 2021-06-13 DIAGNOSIS — M6281 Muscle weakness (generalized): Secondary | ICD-10-CM | POA: Diagnosis not present

## 2021-06-13 DIAGNOSIS — Z20822 Contact with and (suspected) exposure to covid-19: Secondary | ICD-10-CM | POA: Diagnosis not present

## 2021-06-13 DIAGNOSIS — R41841 Cognitive communication deficit: Secondary | ICD-10-CM | POA: Diagnosis not present

## 2021-06-13 DIAGNOSIS — M5459 Other low back pain: Secondary | ICD-10-CM | POA: Diagnosis not present

## 2021-06-13 DIAGNOSIS — R488 Other symbolic dysfunctions: Secondary | ICD-10-CM | POA: Diagnosis not present

## 2021-06-17 ENCOUNTER — Encounter (HOSPITAL_BASED_OUTPATIENT_CLINIC_OR_DEPARTMENT_OTHER): Payer: Self-pay

## 2021-06-17 ENCOUNTER — Emergency Department (HOSPITAL_BASED_OUTPATIENT_CLINIC_OR_DEPARTMENT_OTHER)
Admission: EM | Admit: 2021-06-17 | Discharge: 2021-06-17 | Disposition: A | Discharge status: Home / Self Care | Payer: Medicare Other | Attending: Emergency Medicine | Admitting: Emergency Medicine

## 2021-06-17 ENCOUNTER — Emergency Department (HOSPITAL_BASED_OUTPATIENT_CLINIC_OR_DEPARTMENT_OTHER): Payer: Medicare Other

## 2021-06-17 ENCOUNTER — Other Ambulatory Visit: Payer: Self-pay

## 2021-06-17 DIAGNOSIS — Z79899 Other long term (current) drug therapy: Secondary | ICD-10-CM | POA: Diagnosis not present

## 2021-06-17 DIAGNOSIS — I2510 Atherosclerotic heart disease of native coronary artery without angina pectoris: Secondary | ICD-10-CM | POA: Diagnosis not present

## 2021-06-17 DIAGNOSIS — Z87891 Personal history of nicotine dependence: Secondary | ICD-10-CM | POA: Diagnosis not present

## 2021-06-17 DIAGNOSIS — Z96653 Presence of artificial knee joint, bilateral: Secondary | ICD-10-CM | POA: Insufficient documentation

## 2021-06-17 DIAGNOSIS — I10 Essential (primary) hypertension: Secondary | ICD-10-CM | POA: Insufficient documentation

## 2021-06-17 DIAGNOSIS — R488 Other symbolic dysfunctions: Secondary | ICD-10-CM | POA: Diagnosis not present

## 2021-06-17 DIAGNOSIS — Z7982 Long term (current) use of aspirin: Secondary | ICD-10-CM | POA: Insufficient documentation

## 2021-06-17 DIAGNOSIS — R41841 Cognitive communication deficit: Secondary | ICD-10-CM | POA: Diagnosis not present

## 2021-06-17 DIAGNOSIS — F03911 Unspecified dementia, unspecified severity, with agitation: Secondary | ICD-10-CM | POA: Diagnosis not present

## 2021-06-17 DIAGNOSIS — Z951 Presence of aortocoronary bypass graft: Secondary | ICD-10-CM | POA: Insufficient documentation

## 2021-06-17 DIAGNOSIS — E119 Type 2 diabetes mellitus without complications: Secondary | ICD-10-CM | POA: Diagnosis not present

## 2021-06-17 DIAGNOSIS — M549 Dorsalgia, unspecified: Secondary | ICD-10-CM | POA: Diagnosis not present

## 2021-06-17 DIAGNOSIS — R2689 Other abnormalities of gait and mobility: Secondary | ICD-10-CM | POA: Diagnosis not present

## 2021-06-17 DIAGNOSIS — F0390 Unspecified dementia without behavioral disturbance: Secondary | ICD-10-CM | POA: Diagnosis not present

## 2021-06-17 DIAGNOSIS — Z96612 Presence of left artificial shoulder joint: Secondary | ICD-10-CM | POA: Diagnosis not present

## 2021-06-17 DIAGNOSIS — M6281 Muscle weakness (generalized): Secondary | ICD-10-CM | POA: Diagnosis not present

## 2021-06-17 DIAGNOSIS — M5459 Other low back pain: Secondary | ICD-10-CM | POA: Diagnosis not present

## 2021-06-17 DIAGNOSIS — R4182 Altered mental status, unspecified: Secondary | ICD-10-CM | POA: Diagnosis present

## 2021-06-17 DIAGNOSIS — R451 Restlessness and agitation: Secondary | ICD-10-CM | POA: Diagnosis not present

## 2021-06-17 LAB — URINALYSIS, ROUTINE W REFLEX MICROSCOPIC
Bilirubin Urine: NEGATIVE
Glucose, UA: NEGATIVE mg/dL
Hgb urine dipstick: NEGATIVE
Ketones, ur: NEGATIVE mg/dL
Leukocytes,Ua: NEGATIVE
Nitrite: NEGATIVE
Protein, ur: 30 mg/dL — AB
Specific Gravity, Urine: 1.024 (ref 1.005–1.030)
pH: 6.5 (ref 5.0–8.0)

## 2021-06-17 NOTE — ED Notes (Signed)
Pt and daughter refusing discharge vitals. Pt's daughter verbalizes understanding of discharge instructions. Opportunity for questioning and answers were provided. Pt discharged from ED to home.

## 2021-06-17 NOTE — ED Provider Notes (Signed)
[facility] Provider Note: Susan Spurling, MD, FACEP  CSN: [id number] MRN: [id number] ARRIVAL: 06/17/21 at [place]: [place]  Altered Mental Status   HISTORY OF PRESENT ILLNESS  06/17/21 11:04 PM [name] is a 88 y.o. female with a history of dementia.  Her daughter, who is a Teacher, music, brought her in because she has been more confused and argumentative the last several days, worse today.  Apart from complaining of intermittent back pain for the last several weeks she has had no specific complaints.  She has not had a fever or URI symptoms.  She denies any acute complaints.  The daughter is concerned she may have a urinary tract infection triggering her confusion.  The patient cannot tell me what kind of facility she is in; she believes she is in a big house.  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  Even though she acknowledges that she is retired she also insists that she needs to go back to work in the morning.   Past Medical History:  Diagnosis Date   CAD, NATIVE VESSEL 06/09/2008   DIABETES MELLITUS, TYPE II    FIBROMYALGIA    GERD    Gout    HIATAL HERNIA    HYPERLIPIDEMIA    HYPERTENSION    Insomnia    OA (osteoarthritis)    severe, shoulders, hands - inflammatory, ?RA   S/P CABG x 1    S/P inguinal hernia repair    TUBULOVILLOUS ADENOMA, 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   colo 04/2012 - no polyps - no further screening planned (age >48)   UNSPECIFIED ANEMIA     Past Surgical History:  Procedure Laterality Date   ABDOMINAL HYSTERECTOMY  1981   APPENDECTOMY     CATARACT EXTRACTION, BILATERAL     CORONARY ARTERY BYPASS GRAFT  2010   HERNIA REPAIR     INGUINAL   I & D EXTREMITY Left 12/07/2017   Procedure: IRRIGATION AND DEBRIDEMENT EXTREMITY WITH FOREIGN BODY REMOVAL;  Surgeon: Nicholes Stairs, MD;  Location: [place];  Service: Orthopedics;  Laterality: Left;   JOINT REPLACEMENT     OVARIAN CYST  REMOVAL     REPLACEMENT TOTAL KNEE BILATERAL Bilateral    Rt-1974, Lt -1989   REVERSE SHOULDER ARTHROPLASTY  09/08/2011   Procedure: REVERSE SHOULDER ARTHROPLASTY;  Surgeon: Nita Sells, MD;  Location: [place];  Service: Orthopedics;  Laterality: Left;  left reverse total shoulder arthroplasty   ROTATOR CUFF REPAIR     Left    Family History  Problem Relation Age of Onset   Cancer Mother        Stomach   Stomach cancer Mother 80   Hypertension Mother    Heart disease Father 16       MI   Diabetes Father    Heart attack Father    Vascular Disease Sister    Stroke Sister    Dementia Sister    Diabetes Sister    Parkinsonism Sister    Stroke Sister    Heart disease Sister    Heart attack Brother    Stroke Daughter    Diabetes Daughter    Hypertension Daughter    Colon cancer Neg Hx     Social History   Tobacco Use   Smoking status: Former    Types: Cigarettes    Quit date: 04/26/1970    Years since quitting: 51.1   Smokeless tobacco: Never  Vaping  Use   Vaping Use: Never used  Substance Use Topics   Alcohol use: Not Currently    Alcohol/week: 1.0 standard drink    Types: 1 Glasses of wine per week   Drug use: No    Prior to Admission medications   Medication Sig Start Date End Date Taking? Authorizing Provider  acetaminophen (TYLENOL) 500 MG tablet Take 1,000 mg by mouth 2 (two) times daily.     [provider]  amLODipine (NORVASC) 5 MG tablet Take 1 tablet (5 mg total) by mouth 2 (two) times daily. 08/08/20   Binnie Rail, MD  aspirin EC 81 MG tablet Take 81 mg by mouth daily.    [provider]  busPIRone (BUSPAR) 15 MG tablet Take 1 tablet (15 mg total) by mouth 2 (two) times daily. 12/12/20   Cameron Sprang, MD  carvedilol (COREG) 6.25 MG tablet TAKE ONE TABLET BY MOUTH TWICE A DAY 12/05/20   Fay Records, MD  cetirizine (ZYRTEC) 10 MG tablet TAKE ONE TABLET BY MOUTH DAILY Patient taking differently: Take 10 mg by mouth at bedtime.  02/22/17   Binnie Rail, MD  Cholecalciferol (D3-1000) 25 MCG (1000 UT) capsule Take 1,000 Units by mouth daily.    [provider]  diclofenac sodium (VOLTAREN) 1 % GEL Apply 2 g topically 4 (four) times daily as needed (pain). 08/25/18   Binnie Rail, MD  donepezil (ARICEPT) 10 MG tablet TAKE ONE TABLET BY MOUTH DAILY 03/10/21   Cameron Sprang, MD  DULoxetine (CYMBALTA) 60 MG capsule Take 1 capsule (60 mg total) by mouth daily. 12/12/20   Cameron Sprang, MD  gabapentin (NEURONTIN) 300 MG capsule Take 1 capsule (300 mg total) by mouth at bedtime. 08/08/20   Binnie Rail, MD  Magnesium 100 MG TABS Take by mouth daily.    [provider]  meloxicam (MOBIC) 7.5 MG tablet TAKE ONE TABLET BY MOUTH DAILY 12/26/20   Binnie Rail, MD  memantine (NAMENDA) 10 MG tablet Take 1 tablet (10 mg total) by mouth 2 (two) times daily. 12/12/20   Cameron Sprang, MD  omeprazole (PRILOSEC) 40 MG capsule TAKE ONE CAPSULE BY MOUTH DAILY 08/08/20   Binnie Rail, MD  QUEtiapine (SEROQUEL) 50 MG tablet Take 1 tablet (50 mg total) by mouth at bedtime. 12/12/20   Cameron Sprang, MD  ramipril (ALTACE) 10 MG capsule Take 1 capsule (10 mg total) by mouth 2 (two) times daily. 08/08/20   Binnie Rail, MD    Allergies Patient has no known allergies.   REVIEW OF SYSTEMS  Negative except as noted here or in the History of Present Illness.   PHYSICAL EXAMINATION  Initial Vital Signs Blood pressure (!) 172/86, pulse 76, temperature 98.3 F (36.8 C), temperature source Oral, resp. rate 18, height 4\' 11"  (1.499 m), weight 65.8 kg, SpO2 99 %.  Examination General: Well-developed, well-nourished female in no acute distress; appearance consistent with age of record HENT: normocephalic; atraumatic Eyes: pupils equal, round and reactive to light; extraocular muscles intact; arcus senilis bilaterally Neck: supple Heart: regular rate and rhythm Lungs: clear to auscultation bilaterally Abdomen: soft;  nondistended; nontender; bowel sounds present Extremities: No acute deformity; full range of motion; pulses normal Neurologic: Awake, alert and oriented to person; motor function intact in all extremities and symmetric; no facial droop Skin: Warm and dry Psychiatric: Confused; argumentative   RESULTS  Summary of this visit's results, reviewed and interpreted by myself:  EKG Interpretation  Date/Time:    Ventricular Rate:    PR Interval:    QRS Duration:   QT Interval:    QTC Calculation:   R Axis:     Text Interpretation:         Laboratory Studies: Results for orders placed or performed during the hospital encounter of 06/17/21 (from the past 24 hour(s))  Urinalysis, Routine w reflex microscopic Urine, Clean Catch     Status: Abnormal   Collection Time: 06/17/21  8:33 PM  Result Value Ref Range   Color, Urine YELLOW YELLOW   APPearance CLEAR CLEAR   Specific Gravity, Urine 1.024 1.005 - 1.030   pH 6.5 5.0 - 8.0   Glucose, UA NEGATIVE NEGATIVE mg/dL   Hgb urine dipstick NEGATIVE NEGATIVE   Bilirubin Urine NEGATIVE NEGATIVE   Ketones, ur NEGATIVE NEGATIVE mg/dL   Protein, ur 30 (A) NEGATIVE mg/dL   Nitrite NEGATIVE NEGATIVE   Leukocytes,Ua NEGATIVE NEGATIVE   RBC / HPF 0-5 0 - 5 RBC/hpf   Squamous Epithelial / LPF 0-5 0 - 5   Mucus PRESENT    Imaging Studies: No results found.  ED COURSE and MDM  Nursing notes, initial and subsequent vitals signs, including pulse oximetry, reviewed and interpreted by myself.  Vitals:   06/17/21 2026 06/17/21 2029 06/17/21 2135  BP:  (!) 190/88 (!) 172/86  Pulse:  69 76  Resp:  18 18  Temp:  98.3 F (36.8 C)   TempSrc:  Oral   SpO2:  95% 99%  Weight: 65.8 kg    Height: 4\' 11"  (1.499 m)     Medications - No data to display  11:42 PM Patient getting more argumentative and more agitated.  She avail mentally refuses any additional work-up and insists on going home because she needs to work in the morning.  This was  discussed with the daughter and we agreed that we will discharge the patient home and the daughter will contact her neurologist tomorrow morning.  This likely represents sundowning in the context of worsening dementia.  The cause for the recent worsening of the dementia is unclear.  PROCEDURES  Procedures   ED DIAGNOSES     ICD-10-CM   1. Dementia with agitation, unspecified dementia severity, unspecified dementia type  F03.911          Kenidy Crossland, Jenny Reichmann, MD 06/17/21 [phone]

## 2021-06-17 NOTE — ED Notes (Signed)
Pt's daughter requesting to wait to do labs at this time.

## 2021-06-17 NOTE — ED Triage Notes (Signed)
Per pt daughter, pt had dementia,  Worsening AMS over past couple days Concern for UTI Pt denies any urinary symptoms  Complaining of slight back pain

## 2021-06-18 DIAGNOSIS — M5459 Other low back pain: Secondary | ICD-10-CM | POA: Diagnosis not present

## 2021-06-18 DIAGNOSIS — R488 Other symbolic dysfunctions: Secondary | ICD-10-CM | POA: Diagnosis not present

## 2021-06-18 DIAGNOSIS — R41841 Cognitive communication deficit: Secondary | ICD-10-CM | POA: Diagnosis not present

## 2021-06-18 DIAGNOSIS — M6281 Muscle weakness (generalized): Secondary | ICD-10-CM | POA: Diagnosis not present

## 2021-06-18 DIAGNOSIS — R2689 Other abnormalities of gait and mobility: Secondary | ICD-10-CM | POA: Diagnosis not present

## 2021-06-19 ENCOUNTER — Encounter: Payer: Self-pay | Admitting: Neurology

## 2021-06-19 ENCOUNTER — Other Ambulatory Visit: Payer: Self-pay

## 2021-06-19 ENCOUNTER — Ambulatory Visit (INDEPENDENT_AMBULATORY_CARE_PROVIDER_SITE_OTHER): Payer: Medicare Other | Admitting: Neurology

## 2021-06-19 VITALS — BP 146/76 | HR 67 | Ht 59.0 in | Wt 139.6 lb

## 2021-06-19 DIAGNOSIS — F01B3 Vascular dementia, moderate, with mood disturbance: Secondary | ICD-10-CM | POA: Diagnosis not present

## 2021-06-19 MED ORDER — MEMANTINE HCL 10 MG PO TABS: 10.0000 mg | ORAL_TABLET | Freq: Two times a day (BID) | ORAL | 3 refills | Status: DC

## 2021-06-19 MED ORDER — BUSPIRONE HCL 15 MG PO TABS: 15.0000 mg | ORAL_TABLET | Freq: Two times a day (BID) | ORAL | 3 refills | Status: DC

## 2021-06-19 MED ORDER — QUETIAPINE FUMARATE 50 MG PO TABS: 50.0000 mg | ORAL_TABLET | Freq: Every day | ORAL | 3 refills | Status: DC

## 2021-06-19 MED ORDER — DIVALPROEX SODIUM 125 MG PO DR TAB: DELAYED_RELEASE_TABLET | ORAL | 11 refills | Status: DC

## 2021-06-19 MED ORDER — DONEPEZIL HCL 10 MG PO TABS: ORAL_TABLET | ORAL | 0 refills | Status: DC

## 2021-06-19 NOTE — Patient Instructions (Addendum)
Good to see you. ? ?Start Depakote 125mg : take 1 tablet every night for 1 week, then increase to 1 tablet twice a day ? ?2. Continue Donepezil 10mg  daily, Memantine 10mg  twice a day, Buspar 15mg  twice a day, and Seroquel 50mg  at bedtime ? ?3. Continue close supervision, follow-up in 6 months, call for any changes ? ? ?FALL PRECAUTIONS: Be cautious when walking. Scan the area for obstacles that may increase the risk of trips and falls. When getting up in the mornings, sit up at the edge of the bed for a few minutes before getting out of bed. Consider elevating the bed at the head end to avoid drop of blood pressure when getting up. Walk always in a well-lit room (use night lights in the walls). Avoid area rugs or power cords from appliances in the middle of the walkways. Use a walker or a cane if necessary and consider physical therapy for balance exercise. Get your eyesight checked regularly. ? ?HOME SAFETY: Consider the safety of the kitchen when operating appliances like stoves, microwave oven, and blender. Consider having supervision and share cooking responsibilities until no longer able to participate in those. Accidents with firearms and other hazards in the house should be identified and addressed as well. ? ?ABILITY TO BE LEFT ALONE: If patient is unable to contact 911 operator, consider using LifeLine, or when the need is there, arrange for someone to stay with patients. Smoking is a fire hazard, consider supervision or cessation. Risk of wandering should be assessed by caregiver and if detected at any point, supervision and safe proof recommendations should be instituted. ? ?MEDICATION SUPERVISION: Inability to self-administer medication needs to be constantly addressed. Implement a mechanism to ensure safe administration of the medications. ? ?RECOMMENDATIONS FOR ALL PATIENTS WITH MEMORY PROBLEMS: ?1. Continue to exercise (Recommend 30 minutes of walking everyday, or 3 hours every week) ?2. Increase  social interactions - continue going to [place] and enjoy social gatherings with friends and family ?3. Eat healthy, avoid fried foods and eat more fruits and vegetables ?4. Maintain adequate blood pressure, blood sugar, and blood cholesterol level. Reducing the risk of stroke and cardiovascular disease also helps promoting better memory. ?5. Avoid stressful situations. Live a simple life and avoid aggravations. Organize your time and prepare for the next day in anticipation. ?6. Sleep well, avoid any interruptions of sleep and avoid any distractions in the bedroom that may interfere with adequate sleep quality ?7. Avoid sugar, avoid sweets as there is a strong link between excessive sugar intake, diabetes, and cognitive impairment ?The Mediterranean diet has been shown to help patients reduce the risk of progressive memory disorders and reduces cardiovascular risk. This includes eating fish, eat fruits and green leafy vegetables, nuts like almonds and hazelnuts, walnuts, and also use olive oil. Avoid fast foods and fried foods as much as possible. Avoid sweets and sugar as sugar use has been linked to worsening of memory function. ? ?There is always a concern of gradual progression of memory problems. If this is the case, then we may need to adjust level of care according to patient needs. Support, both to the patient and caregiver, should then be put into place. ? ?
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
## 2021-06-20 DIAGNOSIS — M6281 Muscle weakness (generalized): Secondary | ICD-10-CM | POA: Diagnosis not present

## 2021-06-20 DIAGNOSIS — R488 Other symbolic dysfunctions: Secondary | ICD-10-CM | POA: Diagnosis not present

## 2021-06-20 DIAGNOSIS — M5459 Other low back pain: Secondary | ICD-10-CM | POA: Diagnosis not present

## 2021-06-20 DIAGNOSIS — R41841 Cognitive communication deficit: Secondary | ICD-10-CM | POA: Diagnosis not present

## 2021-06-20 DIAGNOSIS — R2689 Other abnormalities of gait and mobility: Secondary | ICD-10-CM | POA: Diagnosis not present

## 2021-06-22 DIAGNOSIS — Z20828 Contact with and (suspected) exposure to other viral communicable diseases: Secondary | ICD-10-CM | POA: Diagnosis not present

## 2021-06-23 DIAGNOSIS — R41841 Cognitive communication deficit: Secondary | ICD-10-CM | POA: Diagnosis not present

## 2021-06-23 DIAGNOSIS — M6281 Muscle weakness (generalized): Secondary | ICD-10-CM | POA: Diagnosis not present

## 2021-06-23 DIAGNOSIS — M5459 Other low back pain: Secondary | ICD-10-CM | POA: Diagnosis not present

## 2021-06-23 DIAGNOSIS — R2689 Other abnormalities of gait and mobility: Secondary | ICD-10-CM | POA: Diagnosis not present

## 2021-06-23 DIAGNOSIS — R488 Other symbolic dysfunctions: Secondary | ICD-10-CM | POA: Diagnosis not present

## 2021-06-24 DIAGNOSIS — M5459 Other low back pain: Secondary | ICD-10-CM | POA: Diagnosis not present

## 2021-06-24 DIAGNOSIS — M6281 Muscle weakness (generalized): Secondary | ICD-10-CM | POA: Diagnosis not present

## 2021-06-24 DIAGNOSIS — R41841 Cognitive communication deficit: Secondary | ICD-10-CM | POA: Diagnosis not present

## 2021-06-24 DIAGNOSIS — R488 Other symbolic dysfunctions: Secondary | ICD-10-CM | POA: Diagnosis not present

## 2021-06-24 DIAGNOSIS — R2689 Other abnormalities of gait and mobility: Secondary | ICD-10-CM | POA: Diagnosis not present

## 2021-06-25 DIAGNOSIS — R2689 Other abnormalities of gait and mobility: Secondary | ICD-10-CM | POA: Diagnosis not present

## 2021-06-25 DIAGNOSIS — R488 Other symbolic dysfunctions: Secondary | ICD-10-CM | POA: Diagnosis not present

## 2021-06-25 DIAGNOSIS — M6281 Muscle weakness (generalized): Secondary | ICD-10-CM | POA: Diagnosis not present

## 2021-06-25 DIAGNOSIS — R41841 Cognitive communication deficit: Secondary | ICD-10-CM | POA: Diagnosis not present

## 2021-06-25 DIAGNOSIS — M5459 Other low back pain: Secondary | ICD-10-CM | POA: Diagnosis not present

## 2021-06-27 DIAGNOSIS — M6281 Muscle weakness (generalized): Secondary | ICD-10-CM | POA: Diagnosis not present

## 2021-06-27 DIAGNOSIS — M5459 Other low back pain: Secondary | ICD-10-CM | POA: Diagnosis not present

## 2021-06-27 DIAGNOSIS — R41841 Cognitive communication deficit: Secondary | ICD-10-CM | POA: Diagnosis not present

## 2021-06-27 DIAGNOSIS — R2689 Other abnormalities of gait and mobility: Secondary | ICD-10-CM | POA: Diagnosis not present

## 2021-06-27 DIAGNOSIS — R488 Other symbolic dysfunctions: Secondary | ICD-10-CM | POA: Diagnosis not present

## 2021-06-28 DIAGNOSIS — Z20822 Contact with and (suspected) exposure to covid-19: Secondary | ICD-10-CM | POA: Diagnosis not present

## 2021-06-30 DIAGNOSIS — M6281 Muscle weakness (generalized): Secondary | ICD-10-CM | POA: Diagnosis not present

## 2021-06-30 DIAGNOSIS — R488 Other symbolic dysfunctions: Secondary | ICD-10-CM | POA: Diagnosis not present

## 2021-06-30 DIAGNOSIS — R2689 Other abnormalities of gait and mobility: Secondary | ICD-10-CM | POA: Diagnosis not present

## 2021-06-30 DIAGNOSIS — R41841 Cognitive communication deficit: Secondary | ICD-10-CM | POA: Diagnosis not present

## 2021-06-30 DIAGNOSIS — M5459 Other low back pain: Secondary | ICD-10-CM | POA: Diagnosis not present

## 2021-07-01 DIAGNOSIS — R2689 Other abnormalities of gait and mobility: Secondary | ICD-10-CM | POA: Diagnosis not present

## 2021-07-01 DIAGNOSIS — M6281 Muscle weakness (generalized): Secondary | ICD-10-CM | POA: Diagnosis not present

## 2021-07-01 DIAGNOSIS — M5459 Other low back pain: Secondary | ICD-10-CM | POA: Diagnosis not present

## 2021-07-01 DIAGNOSIS — R488 Other symbolic dysfunctions: Secondary | ICD-10-CM | POA: Diagnosis not present

## 2021-07-01 DIAGNOSIS — R41841 Cognitive communication deficit: Secondary | ICD-10-CM | POA: Diagnosis not present

## 2021-07-02 DIAGNOSIS — R488 Other symbolic dysfunctions: Secondary | ICD-10-CM | POA: Diagnosis not present

## 2021-07-02 DIAGNOSIS — M6281 Muscle weakness (generalized): Secondary | ICD-10-CM | POA: Diagnosis not present

## 2021-07-02 DIAGNOSIS — M5459 Other low back pain: Secondary | ICD-10-CM | POA: Diagnosis not present

## 2021-07-02 DIAGNOSIS — R2689 Other abnormalities of gait and mobility: Secondary | ICD-10-CM | POA: Diagnosis not present

## 2021-07-02 DIAGNOSIS — R41841 Cognitive communication deficit: Secondary | ICD-10-CM | POA: Diagnosis not present

## 2021-07-08 DIAGNOSIS — R2689 Other abnormalities of gait and mobility: Secondary | ICD-10-CM | POA: Diagnosis not present

## 2021-07-08 DIAGNOSIS — R488 Other symbolic dysfunctions: Secondary | ICD-10-CM | POA: Diagnosis not present

## 2021-07-08 DIAGNOSIS — M5459 Other low back pain: Secondary | ICD-10-CM | POA: Diagnosis not present

## 2021-07-08 DIAGNOSIS — M6281 Muscle weakness (generalized): Secondary | ICD-10-CM | POA: Diagnosis not present

## 2021-07-08 DIAGNOSIS — R41841 Cognitive communication deficit: Secondary | ICD-10-CM | POA: Diagnosis not present

## 2021-07-09 ENCOUNTER — Telehealth: Payer: Self-pay | Admitting: Internal Medicine

## 2021-07-09 DIAGNOSIS — R41841 Cognitive communication deficit: Secondary | ICD-10-CM | POA: Diagnosis not present

## 2021-07-09 DIAGNOSIS — R2689 Other abnormalities of gait and mobility: Secondary | ICD-10-CM | POA: Diagnosis not present

## 2021-07-09 DIAGNOSIS — M5459 Other low back pain: Secondary | ICD-10-CM | POA: Diagnosis not present

## 2021-07-09 DIAGNOSIS — R488 Other symbolic dysfunctions: Secondary | ICD-10-CM | POA: Diagnosis not present

## 2021-07-09 DIAGNOSIS — M6281 Muscle weakness (generalized): Secondary | ICD-10-CM | POA: Diagnosis not present

## 2021-07-09 NOTE — Telephone Encounter (Signed)
Left message for patient to call back to schedule Medicare Annual Wellness Visit  ? ?Last AWV  04/02/15 ? ?Please schedule at anytime with [facility] if patient calls the office back.   ? ?40 Minutes appointment  ? ?Any questions, please call me at [phone]  ?

## 2021-07-11 DIAGNOSIS — M5459 Other low back pain: Secondary | ICD-10-CM | POA: Diagnosis not present

## 2021-07-11 DIAGNOSIS — R41841 Cognitive communication deficit: Secondary | ICD-10-CM | POA: Diagnosis not present

## 2021-07-11 DIAGNOSIS — R2689 Other abnormalities of gait and mobility: Secondary | ICD-10-CM | POA: Diagnosis not present

## 2021-07-11 DIAGNOSIS — M6281 Muscle weakness (generalized): Secondary | ICD-10-CM | POA: Diagnosis not present

## 2021-07-11 DIAGNOSIS — R488 Other symbolic dysfunctions: Secondary | ICD-10-CM | POA: Diagnosis not present

## 2021-07-14 DIAGNOSIS — M25551 Pain in right hip: Secondary | ICD-10-CM | POA: Diagnosis not present

## 2021-07-14 DIAGNOSIS — M5416 Radiculopathy, lumbar region: Secondary | ICD-10-CM | POA: Diagnosis not present

## 2021-07-17 DIAGNOSIS — Z20828 Contact with and (suspected) exposure to other viral communicable diseases: Secondary | ICD-10-CM | POA: Diagnosis not present

## 2021-07-17 DIAGNOSIS — Z1152 Encounter for screening for COVID-19: Secondary | ICD-10-CM | POA: Diagnosis not present

## 2021-07-18 DIAGNOSIS — M5459 Other low back pain: Secondary | ICD-10-CM | POA: Diagnosis not present

## 2021-07-18 DIAGNOSIS — R488 Other symbolic dysfunctions: Secondary | ICD-10-CM | POA: Diagnosis not present

## 2021-07-18 DIAGNOSIS — R41841 Cognitive communication deficit: Secondary | ICD-10-CM | POA: Diagnosis not present

## 2021-07-18 DIAGNOSIS — Z20822 Contact with and (suspected) exposure to covid-19: Secondary | ICD-10-CM | POA: Diagnosis not present

## 2021-07-18 DIAGNOSIS — M6281 Muscle weakness (generalized): Secondary | ICD-10-CM | POA: Diagnosis not present

## 2021-07-18 DIAGNOSIS — R2689 Other abnormalities of gait and mobility: Secondary | ICD-10-CM | POA: Diagnosis not present

## 2021-07-22 DIAGNOSIS — M5459 Other low back pain: Secondary | ICD-10-CM | POA: Diagnosis not present

## 2021-07-22 DIAGNOSIS — M6281 Muscle weakness (generalized): Secondary | ICD-10-CM | POA: Diagnosis not present

## 2021-07-22 DIAGNOSIS — R488 Other symbolic dysfunctions: Secondary | ICD-10-CM | POA: Diagnosis not present

## 2021-07-22 DIAGNOSIS — R2689 Other abnormalities of gait and mobility: Secondary | ICD-10-CM | POA: Diagnosis not present

## 2021-07-22 DIAGNOSIS — R41841 Cognitive communication deficit: Secondary | ICD-10-CM | POA: Diagnosis not present

## 2021-07-23 DIAGNOSIS — Z20822 Contact with and (suspected) exposure to covid-19: Secondary | ICD-10-CM | POA: Diagnosis not present

## 2021-07-24 DIAGNOSIS — M545 Low back pain, unspecified: Secondary | ICD-10-CM | POA: Diagnosis not present

## 2021-07-24 DIAGNOSIS — Z20822 Contact with and (suspected) exposure to covid-19: Secondary | ICD-10-CM | POA: Diagnosis not present

## 2021-07-25 DIAGNOSIS — M5459 Other low back pain: Secondary | ICD-10-CM | POA: Diagnosis not present

## 2021-07-25 DIAGNOSIS — R41841 Cognitive communication deficit: Secondary | ICD-10-CM | POA: Diagnosis not present

## 2021-07-25 DIAGNOSIS — R488 Other symbolic dysfunctions: Secondary | ICD-10-CM | POA: Diagnosis not present

## 2021-07-25 DIAGNOSIS — R2689 Other abnormalities of gait and mobility: Secondary | ICD-10-CM | POA: Diagnosis not present

## 2021-07-25 DIAGNOSIS — M6281 Muscle weakness (generalized): Secondary | ICD-10-CM | POA: Diagnosis not present

## 2021-07-28 DIAGNOSIS — M6281 Muscle weakness (generalized): Secondary | ICD-10-CM | POA: Diagnosis not present

## 2021-07-28 DIAGNOSIS — R488 Other symbolic dysfunctions: Secondary | ICD-10-CM | POA: Diagnosis not present

## 2021-07-28 DIAGNOSIS — Z20822 Contact with and (suspected) exposure to covid-19: Secondary | ICD-10-CM | POA: Diagnosis not present

## 2021-07-28 DIAGNOSIS — R2689 Other abnormalities of gait and mobility: Secondary | ICD-10-CM | POA: Diagnosis not present

## 2021-07-28 DIAGNOSIS — R41841 Cognitive communication deficit: Secondary | ICD-10-CM | POA: Diagnosis not present

## 2021-07-28 DIAGNOSIS — M5459 Other low back pain: Secondary | ICD-10-CM | POA: Diagnosis not present

## 2021-07-30 DIAGNOSIS — M5459 Other low back pain: Secondary | ICD-10-CM | POA: Diagnosis not present

## 2021-07-30 DIAGNOSIS — M6281 Muscle weakness (generalized): Secondary | ICD-10-CM | POA: Diagnosis not present

## 2021-07-30 DIAGNOSIS — R41841 Cognitive communication deficit: Secondary | ICD-10-CM | POA: Diagnosis not present

## 2021-07-30 DIAGNOSIS — R488 Other symbolic dysfunctions: Secondary | ICD-10-CM | POA: Diagnosis not present

## 2021-07-30 DIAGNOSIS — R2689 Other abnormalities of gait and mobility: Secondary | ICD-10-CM | POA: Diagnosis not present

## 2021-07-31 DIAGNOSIS — M5416 Radiculopathy, lumbar region: Secondary | ICD-10-CM | POA: Diagnosis not present

## 2021-07-31 DIAGNOSIS — M4316 Spondylolisthesis, lumbar region: Secondary | ICD-10-CM | POA: Diagnosis not present

## 2021-08-01 DIAGNOSIS — R2689 Other abnormalities of gait and mobility: Secondary | ICD-10-CM | POA: Diagnosis not present

## 2021-08-01 DIAGNOSIS — R488 Other symbolic dysfunctions: Secondary | ICD-10-CM | POA: Diagnosis not present

## 2021-08-01 DIAGNOSIS — M5459 Other low back pain: Secondary | ICD-10-CM | POA: Diagnosis not present

## 2021-08-01 DIAGNOSIS — M6281 Muscle weakness (generalized): Secondary | ICD-10-CM | POA: Diagnosis not present

## 2021-08-01 DIAGNOSIS — R41841 Cognitive communication deficit: Secondary | ICD-10-CM | POA: Diagnosis not present

## 2021-08-05 DIAGNOSIS — M5459 Other low back pain: Secondary | ICD-10-CM | POA: Diagnosis not present

## 2021-08-05 DIAGNOSIS — R41841 Cognitive communication deficit: Secondary | ICD-10-CM | POA: Diagnosis not present

## 2021-08-05 DIAGNOSIS — R488 Other symbolic dysfunctions: Secondary | ICD-10-CM | POA: Diagnosis not present

## 2021-08-05 DIAGNOSIS — M6281 Muscle weakness (generalized): Secondary | ICD-10-CM | POA: Diagnosis not present

## 2021-08-05 DIAGNOSIS — Z20822 Contact with and (suspected) exposure to covid-19: Secondary | ICD-10-CM | POA: Diagnosis not present

## 2021-08-05 DIAGNOSIS — R2689 Other abnormalities of gait and mobility: Secondary | ICD-10-CM | POA: Diagnosis not present

## 2021-08-06 ENCOUNTER — Other Ambulatory Visit: Payer: Self-pay | Admitting: Internal Medicine

## 2021-08-07 DIAGNOSIS — I739 Peripheral vascular disease, unspecified: Secondary | ICD-10-CM | POA: Diagnosis not present

## 2021-08-07 DIAGNOSIS — L84 Corns and callosities: Secondary | ICD-10-CM | POA: Diagnosis not present

## 2021-08-07 DIAGNOSIS — L603 Nail dystrophy: Secondary | ICD-10-CM | POA: Diagnosis not present

## 2021-08-07 DIAGNOSIS — M5416 Radiculopathy, lumbar region: Secondary | ICD-10-CM | POA: Diagnosis not present

## 2021-08-07 DIAGNOSIS — E1151 Type 2 diabetes mellitus with diabetic peripheral angiopathy without gangrene: Secondary | ICD-10-CM | POA: Diagnosis not present

## 2021-08-08 DIAGNOSIS — R2689 Other abnormalities of gait and mobility: Secondary | ICD-10-CM | POA: Diagnosis not present

## 2021-08-08 DIAGNOSIS — R41841 Cognitive communication deficit: Secondary | ICD-10-CM | POA: Diagnosis not present

## 2021-08-08 DIAGNOSIS — M5459 Other low back pain: Secondary | ICD-10-CM | POA: Diagnosis not present

## 2021-08-08 DIAGNOSIS — R488 Other symbolic dysfunctions: Secondary | ICD-10-CM | POA: Diagnosis not present

## 2021-08-08 DIAGNOSIS — M6281 Muscle weakness (generalized): Secondary | ICD-10-CM | POA: Diagnosis not present

## 2021-08-11 DIAGNOSIS — Z20828 Contact with and (suspected) exposure to other viral communicable diseases: Secondary | ICD-10-CM | POA: Diagnosis not present

## 2021-08-11 DIAGNOSIS — Z20822 Contact with and (suspected) exposure to covid-19: Secondary | ICD-10-CM | POA: Diagnosis not present

## 2021-08-12 DIAGNOSIS — R2689 Other abnormalities of gait and mobility: Secondary | ICD-10-CM | POA: Diagnosis not present

## 2021-08-12 DIAGNOSIS — R41841 Cognitive communication deficit: Secondary | ICD-10-CM | POA: Diagnosis not present

## 2021-08-12 DIAGNOSIS — M6281 Muscle weakness (generalized): Secondary | ICD-10-CM | POA: Diagnosis not present

## 2021-08-12 DIAGNOSIS — M5459 Other low back pain: Secondary | ICD-10-CM | POA: Diagnosis not present

## 2021-08-12 DIAGNOSIS — R488 Other symbolic dysfunctions: Secondary | ICD-10-CM | POA: Diagnosis not present

## 2021-08-13 DIAGNOSIS — M5459 Other low back pain: Secondary | ICD-10-CM | POA: Diagnosis not present

## 2021-08-13 DIAGNOSIS — R488 Other symbolic dysfunctions: Secondary | ICD-10-CM | POA: Diagnosis not present

## 2021-08-13 DIAGNOSIS — M6281 Muscle weakness (generalized): Secondary | ICD-10-CM | POA: Diagnosis not present

## 2021-08-13 DIAGNOSIS — R2689 Other abnormalities of gait and mobility: Secondary | ICD-10-CM | POA: Diagnosis not present

## 2021-08-13 DIAGNOSIS — R41841 Cognitive communication deficit: Secondary | ICD-10-CM | POA: Diagnosis not present

## 2021-08-15 DIAGNOSIS — R41841 Cognitive communication deficit: Secondary | ICD-10-CM | POA: Diagnosis not present

## 2021-08-15 DIAGNOSIS — R488 Other symbolic dysfunctions: Secondary | ICD-10-CM | POA: Diagnosis not present

## 2021-08-15 DIAGNOSIS — Z20822 Contact with and (suspected) exposure to covid-19: Secondary | ICD-10-CM | POA: Diagnosis not present

## 2021-08-15 DIAGNOSIS — M5459 Other low back pain: Secondary | ICD-10-CM | POA: Diagnosis not present

## 2021-08-15 DIAGNOSIS — M6281 Muscle weakness (generalized): Secondary | ICD-10-CM | POA: Diagnosis not present

## 2021-08-15 DIAGNOSIS — R2689 Other abnormalities of gait and mobility: Secondary | ICD-10-CM | POA: Diagnosis not present

## 2021-08-18 ENCOUNTER — Other Ambulatory Visit: Payer: Self-pay | Admitting: Internal Medicine

## 2021-08-19 DIAGNOSIS — M6281 Muscle weakness (generalized): Secondary | ICD-10-CM | POA: Diagnosis not present

## 2021-08-19 DIAGNOSIS — M5459 Other low back pain: Secondary | ICD-10-CM | POA: Diagnosis not present

## 2021-08-19 DIAGNOSIS — R2689 Other abnormalities of gait and mobility: Secondary | ICD-10-CM | POA: Diagnosis not present

## 2021-08-19 DIAGNOSIS — R488 Other symbolic dysfunctions: Secondary | ICD-10-CM | POA: Diagnosis not present

## 2021-08-20 DIAGNOSIS — Z20822 Contact with and (suspected) exposure to covid-19: Secondary | ICD-10-CM | POA: Diagnosis not present

## 2021-08-21 DIAGNOSIS — R2689 Other abnormalities of gait and mobility: Secondary | ICD-10-CM | POA: Diagnosis not present

## 2021-08-21 DIAGNOSIS — Z20822 Contact with and (suspected) exposure to covid-19: Secondary | ICD-10-CM | POA: Diagnosis not present

## 2021-08-21 DIAGNOSIS — M6281 Muscle weakness (generalized): Secondary | ICD-10-CM | POA: Diagnosis not present

## 2021-08-21 DIAGNOSIS — R488 Other symbolic dysfunctions: Secondary | ICD-10-CM | POA: Diagnosis not present

## 2021-08-21 DIAGNOSIS — M5459 Other low back pain: Secondary | ICD-10-CM | POA: Diagnosis not present

## 2021-08-25 DIAGNOSIS — Z20822 Contact with and (suspected) exposure to covid-19: Secondary | ICD-10-CM | POA: Diagnosis not present

## 2021-08-26 DIAGNOSIS — Z20822 Contact with and (suspected) exposure to covid-19: Secondary | ICD-10-CM | POA: Diagnosis not present

## 2021-08-26 DIAGNOSIS — R2689 Other abnormalities of gait and mobility: Secondary | ICD-10-CM | POA: Diagnosis not present

## 2021-08-26 DIAGNOSIS — R488 Other symbolic dysfunctions: Secondary | ICD-10-CM | POA: Diagnosis not present

## 2021-08-26 DIAGNOSIS — Z20828 Contact with and (suspected) exposure to other viral communicable diseases: Secondary | ICD-10-CM | POA: Diagnosis not present

## 2021-08-26 DIAGNOSIS — M6281 Muscle weakness (generalized): Secondary | ICD-10-CM | POA: Diagnosis not present

## 2021-08-26 DIAGNOSIS — M5459 Other low back pain: Secondary | ICD-10-CM | POA: Diagnosis not present

## 2021-08-27 DIAGNOSIS — M6281 Muscle weakness (generalized): Secondary | ICD-10-CM | POA: Diagnosis not present

## 2021-08-27 DIAGNOSIS — R2689 Other abnormalities of gait and mobility: Secondary | ICD-10-CM | POA: Diagnosis not present

## 2021-08-27 DIAGNOSIS — R488 Other symbolic dysfunctions: Secondary | ICD-10-CM | POA: Diagnosis not present

## 2021-08-27 DIAGNOSIS — M5459 Other low back pain: Secondary | ICD-10-CM | POA: Diagnosis not present

## 2021-09-02 ENCOUNTER — Other Ambulatory Visit: Payer: Self-pay | Admitting: Internal Medicine

## 2021-09-02 DIAGNOSIS — I25810 Atherosclerosis of coronary artery bypass graft(s) without angina pectoris: Secondary | ICD-10-CM

## 2021-09-03 DIAGNOSIS — R2689 Other abnormalities of gait and mobility: Secondary | ICD-10-CM | POA: Diagnosis not present

## 2021-09-03 DIAGNOSIS — M5459 Other low back pain: Secondary | ICD-10-CM | POA: Diagnosis not present

## 2021-09-03 DIAGNOSIS — R488 Other symbolic dysfunctions: Secondary | ICD-10-CM | POA: Diagnosis not present

## 2021-09-03 DIAGNOSIS — M6281 Muscle weakness (generalized): Secondary | ICD-10-CM | POA: Diagnosis not present

## 2021-09-04 ENCOUNTER — Encounter: Payer: Self-pay | Admitting: Internal Medicine

## 2021-09-04 ENCOUNTER — Ambulatory Visit: Payer: Medicare Other | Admitting: Internal Medicine

## 2021-09-04 DIAGNOSIS — M4316 Spondylolisthesis, lumbar region: Secondary | ICD-10-CM | POA: Diagnosis not present

## 2021-09-04 NOTE — Patient Instructions (Addendum)
     Blood work was ordered.     Medications changes include :   none      Return in about 6 months (around 03/08/2022) for follow up.

## 2021-09-04 NOTE — Progress Notes (Signed)
Subjective:    Patient ID: [name], female    DOB: [birth year], 88 y.o.   MRN: [id number]     HPI [name] is here for follow up of her chronic medical problems, including htn, hld, DM, gerd, depression, dementia, chronic back pain.   Her daughter provides the history due to her dementia.      Medications and allergies reviewed with patient and updated if appropriate.  Current Outpatient Medications on File Prior to Visit  Medication Sig Dispense Refill   acetaminophen (TYLENOL) 500 MG tablet Take 1,000 mg by mouth 2 (two) times daily.      amLODipine (NORVASC) 5 MG tablet TAKE ONE TABLET BY MOUTH TWICE A DAY 180 tablet 3   aspirin EC 81 MG tablet Take 81 mg by mouth daily.     busPIRone (BUSPAR) 15 MG tablet Take 1 tablet (15 mg total) by mouth 2 (two) times daily. 180 tablet 3   carvedilol (COREG) 6.25 MG tablet TAKE ONE TABLET BY MOUTH TWICE A DAY 180 tablet 1   cetirizine (ZYRTEC) 10 MG tablet TAKE ONE TABLET BY MOUTH DAILY (Patient taking differently: Take 10 mg by mouth at bedtime.) 90 tablet 3   Cholecalciferol (D3-1000) 25 MCG (1000 UT) capsule Take 1,000 Units by mouth daily.     diclofenac sodium (VOLTAREN) 1 % GEL Apply 2 g topically 4 (four) times daily as needed (pain). 100 g 5   divalproex (DEPAKOTE) 125 MG DR tablet Take 1 tablet every night for 1 week, then increase to 1 tablet twice a day 60 tablet 11   donepezil (ARICEPT) 10 MG tablet TAKE ONE TABLET BY MOUTH DAILY 90 tablet 0   DULoxetine (CYMBALTA) 60 MG capsule Take 1 capsule (60 mg total) by mouth daily. 90 capsule 3   ferrous sulfate 324 MG TBEC Take 324 mg by mouth daily.     gabapentin (NEURONTIN) 300 MG capsule TAKE ONE CAPSULE BY MOUTH EVERY NIGHT AT BEDTIME 90 capsule 3   MELATONIN PO Take 3 mg by mouth at bedtime as needed.     meloxicam (MOBIC) 7.5 MG tablet TAKE ONE TABLET BY MOUTH DAILY 90 tablet 3   memantine (NAMENDA) 10 MG tablet Take 1 tablet (10 mg total) by mouth 2 (two) times  daily. 180 tablet 3   omeprazole (PRILOSEC) 40 MG capsule TAKE ONE CAPSULE BY MOUTH DAILY 90 capsule 3   QUEtiapine (SEROQUEL) 50 MG tablet Take 1 tablet (50 mg total) by mouth at bedtime. 90 tablet 3   ramipril (ALTACE) 10 MG capsule TAKE ONE CAPSULE BY MOUTH TWICE A DAY 180 capsule 2   No current facility-administered medications on file prior to visit.     Review of Systems  Constitutional:  Negative for chills and fever.  Respiratory:  Negative for cough, shortness of breath and wheezing.   Cardiovascular:  Negative for chest pain, palpitations and leg swelling.  Gastrointestinal:  Negative for abdominal pain and nausea.  Neurological:  Negative for light-headedness and headaches.      Objective:   Vitals:   09/05/21 1321  BP: 136/62  Pulse: 62  Temp: 98.5 F (36.9 C)  SpO2: 94%   BP Readings from Last 3 Encounters:  09/05/21 136/62  06/19/21 (!) 146/76  06/17/21 (!) 172/86   Wt Readings from Last 3 Encounters:  06/19/21 139 lb 9.6 oz (63.3 kg)  06/17/21 145 lb 1 oz (65.8 kg)  02/27/21 145 lb (65.8 kg)   Body mass index  is 28.2 kg/m.    Physical Exam Constitutional:      General: She is not in acute distress.    Appearance: Normal appearance.  HENT:     Head: Normocephalic and atraumatic.  Eyes:     Conjunctiva/sclera: Conjunctivae normal.  Cardiovascular:     Rate and Rhythm: Normal rate and regular rhythm.     Heart sounds: Normal heart sounds. No murmur heard. Pulmonary:     Effort: Pulmonary effort is normal. No respiratory distress.     Breath sounds: Normal breath sounds. No wheezing.  Musculoskeletal:     Cervical back: Neck supple.     Right lower leg: No edema.     Left lower leg: No edema.  Lymphadenopathy:     Cervical: No cervical adenopathy.  Skin:    General: Skin is warm and dry.     Findings: No rash.  Neurological:     Mental Status: She is alert. Mental status is at baseline.  Psychiatric:        Mood and Affect: Mood normal.         Behavior: Behavior normal.       Lab Results  Component Value Date   WBC 4.9 02/27/2021   HGB 13.1 02/27/2021   HCT 40.7 02/27/2021   PLT 176.0 02/27/2021   GLUCOSE 120 (H) 02/27/2021   CHOL 222 (H) 02/27/2021   TRIG 82.0 02/27/2021   HDL 99.40 02/27/2021   LDLDIRECT 115.3 09/30/2009   LDLCALC 106 (H) 02/27/2021   ALT 14 02/27/2021   AST 21 02/27/2021   NA 137 02/27/2021   K 4.4 02/27/2021   CL 101 02/27/2021   CREATININE 0.91 02/27/2021   BUN 17 02/27/2021   CO2 30 02/27/2021   TSH 0.88 12/28/2019   INR 0.99 11/06/2010   HGBA1C 6.0 02/27/2021   MICROALBUR 1.5 04/02/2015     Assessment & Plan:    See Problem List for Assessment and Plan of chronic medical problems.

## 2021-09-05 ENCOUNTER — Encounter: Payer: Self-pay | Admitting: Internal Medicine

## 2021-09-05 ENCOUNTER — Ambulatory Visit (INDEPENDENT_AMBULATORY_CARE_PROVIDER_SITE_OTHER): Payer: Medicare Other | Admitting: Internal Medicine

## 2021-09-05 VITALS — BP 132/62 | HR 59 | Ht 59.0 in | Wt 138.4 lb

## 2021-09-05 VITALS — BP 136/62 | HR 62 | Temp 98.5°F | Ht 59.0 in

## 2021-09-05 DIAGNOSIS — M4716 Other spondylosis with myelopathy, lumbar region: Secondary | ICD-10-CM

## 2021-09-05 DIAGNOSIS — K219 Gastro-esophageal reflux disease without esophagitis: Secondary | ICD-10-CM | POA: Diagnosis not present

## 2021-09-05 DIAGNOSIS — F03A0 Unspecified dementia, mild, without behavioral disturbance, psychotic disturbance, mood disturbance, and anxiety: Secondary | ICD-10-CM

## 2021-09-05 DIAGNOSIS — I25810 Atherosclerosis of coronary artery bypass graft(s) without angina pectoris: Secondary | ICD-10-CM | POA: Diagnosis not present

## 2021-09-05 DIAGNOSIS — I10 Essential (primary) hypertension: Secondary | ICD-10-CM

## 2021-09-05 DIAGNOSIS — E782 Mixed hyperlipidemia: Secondary | ICD-10-CM | POA: Diagnosis not present

## 2021-09-05 DIAGNOSIS — E1149 Type 2 diabetes mellitus with other diabetic neurological complication: Secondary | ICD-10-CM | POA: Diagnosis not present

## 2021-09-05 DIAGNOSIS — F3289 Other specified depressive episodes: Secondary | ICD-10-CM

## 2021-09-05 LAB — CBC WITH DIFFERENTIAL/PLATELET
Basophils Absolute: 0 10*3/uL (ref 0.0–0.1)
Basophils Relative: 0.6 % (ref 0.0–3.0)
Eosinophils Absolute: 0.4 10*3/uL (ref 0.0–0.7)
Eosinophils Relative: 7.3 % — ABNORMAL HIGH (ref 0.0–5.0)
HCT: 38.1 % (ref 36.0–46.0)
Hemoglobin: 12.5 g/dL (ref 12.0–15.0)
Lymphocytes Relative: 33.5 % (ref 12.0–46.0)
Lymphs Abs: 1.9 10*3/uL (ref 0.7–4.0)
MCHC: 32.9 g/dL (ref 30.0–36.0)
MCV: 96.4 fl (ref 78.0–100.0)
Monocytes Absolute: 0.6 10*3/uL (ref 0.1–1.0)
Monocytes Relative: 10.9 % (ref 3.0–12.0)
Neutro Abs: 2.6 10*3/uL (ref 1.4–7.7)
Neutrophils Relative %: 47.7 % (ref 43.0–77.0)
Platelets: 192 10*3/uL (ref 150.0–400.0)
RBC: 3.96 Mil/uL (ref 3.87–5.11)
RDW: 14.6 % (ref 11.5–15.5)
WBC: 5.6 10*3/uL (ref 4.0–10.5)

## 2021-09-05 LAB — COMPREHENSIVE METABOLIC PANEL
ALT: 11 U/L (ref 0–35)
AST: 17 U/L (ref 0–37)
Albumin: 4.2 g/dL (ref 3.5–5.2)
Alkaline Phosphatase: 73 U/L (ref 39–117)
BUN: 28 mg/dL — ABNORMAL HIGH (ref 6–23)
CO2: 29 mEq/L (ref 19–32)
Calcium: 9.4 mg/dL (ref 8.4–10.5)
Chloride: 103 mEq/L (ref 96–112)
Creatinine, Ser: 1.13 mg/dL (ref 0.40–1.20)
GFR: 43.22 mL/min — ABNORMAL LOW (ref >60.00)
Glucose, Bld: 96 mg/dL (ref 70–99)
Potassium: 4.4 mEq/L (ref 3.5–5.1)
Sodium: 140 mEq/L (ref 135–145)
Total Bilirubin: 0.6 mg/dL (ref 0.2–1.2)
Total Protein: 7.2 g/dL (ref 6.0–8.3)

## 2021-09-05 LAB — LIPID PANEL
Cholesterol: 217 mg/dL — ABNORMAL HIGH (ref 0–200)
HDL: 97.6 mg/dL (ref >39.00)
LDL Cholesterol: 97 mg/dL (ref 0–99)
NonHDL: 119.85
Total CHOL/HDL Ratio: 2
Triglycerides: 114 mg/dL (ref 0.0–149.0)
VLDL: 22.8 mg/dL (ref 0.0–40.0)

## 2021-09-05 LAB — HEMOGLOBIN A1C: Hgb A1c MFr Bld: 5.8 % (ref 4.6–6.5)

## 2021-09-05 NOTE — Assessment & Plan Note (Signed)
Chronic Controlled, Stable Continue duloxetine 60 mg daily, Seroquel 50 mg at bedtime

## 2021-09-05 NOTE — Assessment & Plan Note (Addendum)
Chronic Following with Dr. Delice Lesch On Aricept 10 mg daily, Namenda 10 mg twice daily, Seroquel 50 mg at bedtime and BuSpar 15 mg twice daily, depakote 125 mg bid

## 2021-09-05 NOTE — Assessment & Plan Note (Signed)
Chronic Blood pressure well controlled CMP Continue amlodipine 5 mg twice daily, carvedilol 6.25 mg twice daily, ramipril 10 mg twice daily

## 2021-09-05 NOTE — Assessment & Plan Note (Signed)
Chronic Diet controlled Lab Results  Component Value Date   HGBA1C 6.0 02/27/2021   No need for medication We will check A1c

## 2021-09-05 NOTE — Assessment & Plan Note (Addendum)
Chronic Lifestyle controlled Check lipids

## 2021-09-05 NOTE — Assessment & Plan Note (Signed)
Chronic Intermittent pain Continue meloxicam 7.5 mg daily and Voltaren gel

## 2021-09-05 NOTE — Patient Instructions (Signed)
Medication Instructions:   *If you need a refill on your cardiac medications before your next appointment, please call your pharmacy*   Lab Work:  If you have labs (blood work) drawn today and your tests are completely normal, you will receive your results only by: MyChart Message (if you have MyChart) OR A paper copy in the mail If you have any lab test that is abnormal or we need to change your treatment, we will call you to review the results.   Testing/Procedures:    Follow-Up: At [facility], you and your health needs are our priority.  As part of our continuing mission to provide you with exceptional heart care, we have created designated Provider Care Teams.  These Care Teams include your primary Cardiologist (physician) and Advanced Practice Providers (APPs -  Physician Assistants and Nurse Practitioners) who all work together to provide you with the care you need, when you need it.  We recommend signing up for the patient portal called "MyChart".  Sign up information is provided on this After Visit Summary.  MyChart is used to connect with patients for Virtual Visits (Telemedicine).  Patients are able to view lab/test results, encounter notes, upcoming appointments, etc.  Non-urgent messages can be sent to your provider as well.   To learn more about what you can do with MyChart, go to [email or web address].    Your next appointment:   8 month(s)  The format for your next appointment:   In Person  Provider:   Paula Ross, MD     Other Instructions   Important Information About Sugar       

## 2021-09-05 NOTE — Assessment & Plan Note (Signed)
Chronic GERD controlled Continue omeprazole 40 mg daily 

## 2021-09-05 NOTE — Progress Notes (Signed)
Cardiology Office Note   Date:  09/05/2021   ID:  [name], DOB [birth year], MRN [id number]  PCP:  Binnie Rail, MD  Cardiologist:   Dorris Carnes, MD   F/U of CAD      History of Present Illness: [name] is a 88 y.o. female with a history of CAD (s/p CABG in 2010), HTN, HL and fibromyalgia   Myovue in  Novmeber 2018 was negative for ischemia The patient was last in cardiology clinic in Jan 2019    She had a televisit with [facility] in 2021 [facility] the pt says she is doing good  her breathing is OK  She denies chest dyscomfort.  No dizziness   No palpitations     Appetite is good   Sleeping well      Outpatient Medications Prior to Visit  Medication Sig Dispense Refill   acetaminophen (TYLENOL) 500 MG tablet Take 1,000 mg by mouth 2 (two) times daily.      amLODipine (NORVASC) 5 MG tablet TAKE ONE TABLET BY MOUTH TWICE A DAY 180 tablet 3   aspirin EC 81 MG tablet Take 81 mg by mouth daily.     busPIRone (BUSPAR) 15 MG tablet Take 1 tablet (15 mg total) by mouth 2 (two) times daily. 180 tablet 3   carvedilol (COREG) 6.25 MG tablet TAKE ONE TABLET BY MOUTH TWICE A DAY 180 tablet 1   cetirizine (ZYRTEC) 10 MG tablet TAKE ONE TABLET BY MOUTH DAILY (Patient taking differently: Take 10 mg by mouth at bedtime.) 90 tablet 3   Cholecalciferol (D3-1000) 25 MCG (1000 UT) capsule Take 1,000 Units by mouth daily.     diclofenac sodium (VOLTAREN) 1 % GEL Apply 2 g topically 4 (four) times daily as needed (pain). 100 g 5   divalproex (DEPAKOTE) 125 MG DR tablet Take 1 tablet every night for 1 week, then increase to 1 tablet twice a day 60 tablet 11   donepezil (ARICEPT) 10 MG tablet TAKE ONE TABLET BY MOUTH DAILY 90 tablet 0   DULoxetine (CYMBALTA) 60 MG capsule Take 1 capsule (60 mg total) by mouth daily. 90 capsule 3   ferrous sulfate 324 MG TBEC Take 324 mg by mouth daily.     gabapentin (NEURONTIN) 300 MG capsule TAKE ONE CAPSULE BY MOUTH EVERY NIGHT AT BEDTIME 90 capsule  3   MELATONIN PO Take 3 mg by mouth at bedtime as needed.     meloxicam (MOBIC) 7.5 MG tablet TAKE ONE TABLET BY MOUTH DAILY 90 tablet 3   memantine (NAMENDA) 10 MG tablet Take 1 tablet (10 mg total) by mouth 2 (two) times daily. 180 tablet 3   omeprazole (PRILOSEC) 40 MG capsule TAKE ONE CAPSULE BY MOUTH DAILY 90 capsule 3   QUEtiapine (SEROQUEL) 50 MG tablet Take 1 tablet (50 mg total) by mouth at bedtime. 90 tablet 3   ramipril (ALTACE) 10 MG capsule TAKE ONE CAPSULE BY MOUTH TWICE A DAY 180 capsule 2   No facility-administered medications prior to visit.     Allergies:   Patient has no known allergies.   Past Medical History:  Diagnosis Date   CAD, NATIVE VESSEL 06/09/2008   DIABETES MELLITUS, TYPE II    FIBROMYALGIA    GERD    Gout    HIATAL HERNIA    HYPERLIPIDEMIA    HYPERTENSION    Insomnia    OA (osteoarthritis)    severe, shoulders, hands - inflammatory, ?RA   S/P CABG  x 1    S/P inguinal hernia repair    TUBULOVILLOUS ADENOMA, 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   colo 04/2012 - no polyps - no further screening planned (age >77)   UNSPECIFIED ANEMIA     Past Surgical History:  Procedure Laterality Date   ABDOMINAL HYSTERECTOMY  1981   APPENDECTOMY     CATARACT EXTRACTION, BILATERAL     CORONARY ARTERY BYPASS GRAFT  2010   HERNIA REPAIR     INGUINAL   I & D EXTREMITY Left 12/07/2017   Procedure: IRRIGATION AND DEBRIDEMENT EXTREMITY WITH FOREIGN BODY REMOVAL;  Surgeon: Nicholes Stairs, MD;  Location: [facility];  Service: Orthopedics;  Laterality: Left;   JOINT REPLACEMENT     OVARIAN CYST REMOVAL     REPLACEMENT TOTAL KNEE BILATERAL Bilateral    Rt-1974, Lt -1989   REVERSE SHOULDER ARTHROPLASTY  09/08/2011   Procedure: REVERSE SHOULDER ARTHROPLASTY;  Surgeon: Nita Sells, MD;  Location: [facility];  Service: Orthopedics;  Laterality: Left;  left reverse total shoulder arthroplasty   ROTATOR CUFF REPAIR     Left     Social History:  The patient  reports that she quit  smoking about 51 years ago. She has never used smokeless tobacco. She reports that she does not currently use alcohol after a past usage of about 1.0 Flynn drink per week. She reports that she does not use drugs.   Family History:  The patient's family history includes Cancer in her mother; Dementia in her sister; Diabetes in her daughter, father, and sister; Heart attack in her brother and father; Heart disease in her sister; Heart disease (age of onset: 24) in her father; Hypertension in her daughter and mother; Parkinsonism in her sister; Stomach cancer (age of onset: 82) in her mother; Stroke in her daughter, sister, and sister; Vascular Disease in her sister.    ROS:  Please see the history of present illness. All other systems are reviewed and  Negative to the above problem except as noted.    PHYSICAL EXAM: VS:  BP 132/62   Pulse (!) 59   Ht '4\' 11"'$  (1.499 m)   Wt 138 lb 6.4 oz (62.8 kg)   SpO2 97%   BMI 27.95 kg/m   GEN: Well nourished, well developed, in no acute distress  HEENT: normal  Neck: JVP is not elevated     No, carotid bruits Cardiac: RRR; I-II/V systlolic murmur.  No LE edema  Respiratory:  clear to auscultation bilaterally GI: soft, nontender, nondistended, + BS  No hepatomegaly  MS: no deformity Moving all extremities   Skin: warm and dry, no rash Neuro:  Strength and sensation are intact Psych: euthymic mood, full affect   EKG:  EKG shows SB 59 bpm  T wave inversion V1, V2    Lipid Panel    Component Value Date/Time   CHOL 217 (H) 09/05/2021 1352   TRIG 114.0 09/05/2021 1352   HDL 97.60 09/05/2021 1352   CHOLHDL 2 09/05/2021 1352   VLDL 22.8 09/05/2021 1352   LDLCALC 97 09/05/2021 1352   LDLCALC 72 12/28/2019 1042   LDLDIRECT 115.3 09/30/2009 1059      Wt Readings from Last 3 Encounters:  09/05/21 138 lb 6.4 oz (62.8 kg)  06/19/21 139 lb 9.6 oz (63.3 kg)  06/17/21 145 lb 1 oz (65.8 kg)      ASSESSMENT AND PLAN:  1  CAD s/p CABG (remote)    No symptoms to sugg angina   Follow  2  HL Pt just had labs by [facility]  Will review    3.  HTN  BP is adequately controlled  F/U in 8 months   Signed, Dorris Carnes, MD  09/05/2021 [address], [place], [place]  [address] Phone: [phone]; Fax: [phone]

## 2021-09-12 ENCOUNTER — Telehealth: Payer: Self-pay | Admitting: Family Medicine

## 2021-09-12 ENCOUNTER — Telehealth: Payer: Self-pay | Admitting: Neurology

## 2021-09-12 DIAGNOSIS — F03A0 Unspecified dementia, mild, without behavioral disturbance, psychotic disturbance, mood disturbance, and anxiety: Secondary | ICD-10-CM

## 2021-09-12 DIAGNOSIS — R2689 Other abnormalities of gait and mobility: Secondary | ICD-10-CM | POA: Diagnosis not present

## 2021-09-12 DIAGNOSIS — R488 Other symbolic dysfunctions: Secondary | ICD-10-CM | POA: Diagnosis not present

## 2021-09-12 DIAGNOSIS — M6281 Muscle weakness (generalized): Secondary | ICD-10-CM | POA: Diagnosis not present

## 2021-09-12 DIAGNOSIS — M5459 Other low back pain: Secondary | ICD-10-CM | POA: Diagnosis not present

## 2021-09-12 NOTE — Telephone Encounter (Signed)
Pt daughter Dr [name] Heck called an informed that we have sent the referral for palliative care

## 2021-09-12 NOTE — Telephone Encounter (Signed)
Patient's daughter, Dr. Guadlupe Spanish, called requesting a referral to [facility] for palliative care services she thinks her mom may benefit from for her dementia.

## 2021-09-12 NOTE — Telephone Encounter (Signed)
Stopped by [employer] at 1:30 pm today to see patient for initial visit, knocked on the door and there was no answer.  Left my card advising her to call me back when she received it.   Damaris Hippo FNP-C

## 2021-09-12 NOTE — Addendum Note (Signed)
Addended by: Jake Seats on: 09/12/2021 10:15 AM   Modules accepted: Orders

## 2021-09-12 NOTE — Telephone Encounter (Signed)
Ok for referral, thanks

## 2021-09-16 ENCOUNTER — Telehealth: Payer: Self-pay

## 2021-09-16 NOTE — Telephone Encounter (Signed)
Attempted to contact patient's daughter [name] to schedule a Palliative Care consult appointment. No answer left a message to return call.

## 2021-09-16 NOTE — Telephone Encounter (Signed)
Spoke with patient's daughter [name] and scheduled a in person Palliative Consult for 09/19/21 @ 2 PM.   Consent obtained; updated [employer], Team List and Epic.

## 2021-09-18 ENCOUNTER — Other Ambulatory Visit: Payer: Self-pay | Admitting: Internal Medicine

## 2021-09-18 DIAGNOSIS — M4316 Spondylolisthesis, lumbar region: Secondary | ICD-10-CM | POA: Diagnosis not present

## 2021-09-19 ENCOUNTER — Encounter: Payer: Self-pay | Admitting: Family Medicine

## 2021-09-19 ENCOUNTER — Other Ambulatory Visit: Payer: Medicare Other | Admitting: Family Medicine

## 2021-09-19 VITALS — BP 176/86 | HR 56 | Resp 18

## 2021-09-19 DIAGNOSIS — M5459 Other low back pain: Secondary | ICD-10-CM | POA: Diagnosis not present

## 2021-09-19 DIAGNOSIS — R2689 Other abnormalities of gait and mobility: Secondary | ICD-10-CM | POA: Diagnosis not present

## 2021-09-19 DIAGNOSIS — I10 Essential (primary) hypertension: Secondary | ICD-10-CM

## 2021-09-19 DIAGNOSIS — Z515 Encounter for palliative care: Secondary | ICD-10-CM

## 2021-09-19 DIAGNOSIS — R488 Other symbolic dysfunctions: Secondary | ICD-10-CM | POA: Diagnosis not present

## 2021-09-19 DIAGNOSIS — M6281 Muscle weakness (generalized): Secondary | ICD-10-CM | POA: Diagnosis not present

## 2021-09-19 NOTE — Progress Notes (Signed)
[facility] Consult Note Telephone: [phone]  Fax: [phone]   Date of encounter: 09/19/21 2:28 PM PATIENT NAME: [name] [address] [place] [address]   [phone] (home)  DOB: 1930-09-30 MRN: [id number] PRIMARY CARE PROVIDER:    Binnie Rail, MD,  [place] [address] [phone]  REFERRING PROVIDER:   Binnie Rail, MD [address] [place],  [place] [address] [phone]  RESPONSIBLE PARTY:    Contact Information     Name Relation Home Work [place] Daughter   [phone]   Chrystie, [place]   [phone]        I met face to face with patient and daughter Dr Guadlupe Spanish in patient's independent living facility. Palliative Care was asked to follow this patient by consultation request of  Burns, Claudina Lick, MD to address advance care planning and complex medical decision making. This is the initial visit.          ASSESSMENT, SYMPTOM MANAGEMENT AND PLAN / RECOMMENDATIONS:   HTN Not well controlled today. Uncertain if pt has already taken BP meds Continue Carvedilol 6.25 mg BID with meals and Norvasc 5 mg BID Keep scheduled follow up with PCP next week Check BP daily and log to take into PCP  2. Palliative Care Encounter Discussed options for MOST with pt and Daughter  Follow up Palliative Care Visit: Palliative care will continue to follow for complex medical decision making, advance care planning, and clarification of goals. Return 4 weeks or prn.    This visit was coded based on medical decision making (MDM).  PPS: 60%  HOSPICE ELIGIBILITY/DIAGNOSIS: TBD  Chief Complaint:  [facility] received a referral to follow up with patient for chronic disease management in setting of dementia.  Palliative Care is also following to assist with advance directive planning and defining/refining goals of  care.  HISTORY OF PRESENT ILLNESS:  [name] is a 88 y.o. year old female with dementia, HTN, diabetes with peripheral vascular disease and neuropathy, CAD, aortic atherosclerosis, GERD, tubulovillous adenoma of the colon, degenerative arthritis of lumbar spine with cord compression, OA of left shoulder, sciatica, HLD, anemia, gout, depression, RUQ pain and lumbar canal stenosis. Endorses occasional back pain, no falls.  No trouble sleeping.  No trouble with appetite, drinking well. No coughing or choking after eating or drinking.  No CP, SOB. No dysuria.  Occasional constipation. Continent of bowel and bladder with intermittent urge continence. Has 4 children.  History obtained from review of EMR, discussion with daughter who is [facility] POA and a psychiatrist and/or Ms. Gerke.  I reviewed available labs, medications, imaging, studies and related documents from the EMR.  Records reviewed and summarized above.   ROS General: NAD EYES: denies vision changes ENMT: denies dysphagia Cardiovascular: denies chest pain, denies DOE Pulmonary: denies cough, denies increased SOB Abdomen: endorses good appetite, denies constipation, endorses continence of bowel GU: denies dysuria, endorses continence of urine MSK:  denies increased weakness, no falls reported Skin: denies rashes or wounds Neurological: denies pain, denies insomnia Psych: Endorses positive mood Heme/lymph/immuno: denies bruises, abnormal bleeding  Physical Exam: Current and past weights: 138 lbs 6.4 ounces as of 09/05/21, weight 06/19/21 was 139 lbs 9.6 ounces Constitutional: NAD General: WN WD EYES: anicteric sclera, lids intact, no discharge  ENMT: intact hearing, oral mucous membranes moist, dentition intact CV: S1S2, mildly IRRR, no LE edema Pulmonary: CTAB,  no increased work of breathing, no cough, room air Abdomen: normo-active BS + 4 quadrants, soft and non tender, no ascites GU: deferred MSK: no sarcopenia, moves all  extremities, ambulatory Skin: warm and dry, no rashes or wounds on visible skin Neuro:  no generalized weakness, mild cognitive impairment Psych: non-anxious affect, A and O x 3 Hem/lymph/immuno: no widespread bruising  CURRENT PROBLEM LIST:  Patient Active Problem List   Diagnosis Date Noted   Urinary frequency 09/18/2020   Aortic atherosclerosis (HCC) 08/07/2020   Mild dementia (HCC) 07/27/2017   Chronic RUQ pain 05/28/2016   Right knee pain 06/14/2015   Diabetic neuropathy (HCC) 03/21/2014   Bunion 03/21/2014   PVD (pulmonary valve disease) 01/23/2014   Onychomycosis 12/20/2013   PVD (peripheral vascular disease) (HCC) 12/19/2013   Gout 12/13/2013   Depression    Lumbar canal stenosis 05/26/2013   Neuralgia neuritis, sciatic nerve 05/26/2013   Degenerative arthritis of lumbar spine with cord compression 04/24/2013   Arthritis of shoulder region, left 09/11/2011   Type II diabetes mellitus with neurological manifestations (HCC) 01/17/2009   Anemia 10/15/2008   CAD, NATIVE VESSEL 06/09/2008   Hyperlipidemia 06/15/2007   Essential hypertension 06/15/2007   GERD 06/15/2007   TUBULOVILLOUS ADENOMA, 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   DIVERTICULOSIS, 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   PAST MEDICAL HISTORY:  Active Ambulatory Problems    Diagnosis Date Noted   TUBULOVILLOUS ADENOMA, 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   Type II diabetes mellitus with neurological manifestations (HCC) 01/17/2009   Hyperlipidemia 06/15/2007   Anemia 10/15/2008   Essential hypertension 06/15/2007   CAD, NATIVE VESSEL 06/09/2008   GERD 06/15/2007   DIVERTICULOSIS, 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   Arthritis of shoulder region, left 09/11/2011   Degenerative arthritis of lumbar spine with cord compression 04/24/2013   Depression    Gout 12/13/2013   PVD (peripheral vascular disease) (HCC) 12/19/2013   Onychomycosis 12/20/2013   PVD (pulmonary valve disease) 01/23/2014   Diabetic neuropathy (HCC) 03/21/2014   Bunion 03/21/2014   Right knee pain 06/14/2015    Chronic RUQ pain 05/28/2016   Mild dementia (HCC) 07/27/2017   Aortic atherosclerosis (HCC) 08/07/2020   Urinary frequency 09/18/2020   Lumbar canal stenosis 05/26/2013   Neuralgia neuritis, sciatic nerve 05/26/2013   Resolved Ambulatory Problems    Diagnosis Date Noted   Dermatophytosis of nail 02/20/2009   HEMORRHOIDS, INTERNAL 03/02/2007   GASTRITIS 03/12/2009   HIATAL HERNIA 10/29/2000   UNSPECIFIED HEMORRHAGE OF GASTROINTESTINAL TRACT 12/21/2008   DRY SKIN 02/20/2009   ARTHRITIS 06/15/2007   Enthesopathy of hip region 11/28/2009   Fibromyalgia syndrome 06/15/2007   INSOMNIA-SLEEP DISORDER-UNSPEC 09/18/2009   Headache(784.0) 04/08/2009   PALPITATIONS, RECURRENT 04/15/2009   Wheezing 09/18/2009   FECAL OCCULT BLOOD 01/16/2009   COLONIC POLYPS, HX OF 09/18/2008   Memory loss 07/23/2010   Palpitations 07/06/2012   Sciatica 09/05/2012   Sacroiliac joint dysfunction of right side 03/22/2013   Non-compliant behavior 12/05/2013   Radiculopathy of lumbar region 01/31/2014   HAV (hallux abducto valgus) 03/21/2014   Tenosynovitis of ankle 03/21/2014   Pronation deformity of ankle, acquired 03/21/2014   Chest pain 04/17/2014   Pain in lower limb 07/18/2014   Non-compliant behavior 11/23/2014   Acute bronchitis 12/03/2014   Cough 05/02/2015   Abdominal pain, epigastric 09/02/2015   AP (abdominal pain) 09/02/2015   Mild cognitive impairment 12/12/2015   Buttock pain 10/30/2016   Abscess of right foot 12/02/2017   Cellulitis of foot 12/05/2017   Benign hypertension 01/04/2014   Past Medical History:  Diagnosis Date  DIABETES MELLITUS, TYPE II    FIBROMYALGIA    Gout    HYPERLIPIDEMIA    HYPERTENSION    Insomnia    OA (osteoarthritis)    S/P CABG x 1    S/P inguinal hernia repair    UNSPECIFIED ANEMIA    SOCIAL HX:  Social History   Tobacco Use   Smoking status: Former    Types: Cigarettes    Quit date: 04/26/1970    Years since quitting: 51.4   Smokeless  tobacco: Never  Substance Use Topics   Alcohol use: Not Currently    Alcohol/week: 1.0 standard drink    Types: 1 Glasses of wine per week   FAMILY HX:  Family History  Problem Relation Age of Onset   Cancer Mother        Stomach   Stomach cancer Mother 50   Hypertension Mother    Heart disease Father 35       MI   Diabetes Father    Heart attack Father    Vascular Disease Sister    Stroke Sister    Dementia Sister    Diabetes Sister    Parkinsonism Sister    Stroke Sister    Heart disease Sister    Heart attack Brother    Stroke Daughter    Diabetes Daughter    Hypertension Daughter    Colon cancer Neg Hx        Preferred Pharmacy: ALLERGIES: No Known Allergies   PERTINENT MEDICATIONS:  Outpatient Encounter Medications as of 09/19/2021  Medication Sig   acetaminophen (TYLENOL) 500 MG tablet Take 1,000 mg by mouth 2 (two) times daily.    amLODipine (NORVASC) 5 MG tablet TAKE ONE TABLET BY MOUTH TWICE A DAY   aspirin EC 81 MG tablet Take 81 mg by mouth daily.   busPIRone (BUSPAR) 15 MG tablet Take 1 tablet (15 mg total) by mouth 2 (two) times daily.   carvedilol (COREG) 6.25 MG tablet TAKE ONE TABLET BY MOUTH TWICE A DAY   cetirizine (ZYRTEC) 10 MG tablet TAKE ONE TABLET BY MOUTH DAILY (Patient taking differently: Take 10 mg by mouth at bedtime.)   Cholecalciferol (D3-1000) 25 MCG (1000 UT) capsule Take 1,000 Units by mouth daily.   diclofenac sodium (VOLTAREN) 1 % GEL Apply 2 g topically 4 (four) times daily as needed (pain).   divalproex (DEPAKOTE) 125 MG DR tablet Take 1 tablet every night for 1 week, then increase to 1 tablet twice a day   donepezil (ARICEPT) 10 MG tablet TAKE ONE TABLET BY MOUTH DAILY   DULoxetine (CYMBALTA) 60 MG capsule Take 1 capsule (60 mg total) by mouth daily.   ferrous sulfate 324 MG TBEC Take 324 mg by mouth daily.   gabapentin (NEURONTIN) 300 MG capsule TAKE ONE CAPSULE BY MOUTH EVERY NIGHT AT BEDTIME   MELATONIN PO Take 3 mg by mouth at  bedtime as needed.   meloxicam (MOBIC) 7.5 MG tablet TAKE ONE TABLET BY MOUTH DAILY   memantine (NAMENDA) 10 MG tablet Take 1 tablet (10 mg total) by mouth 2 (two) times daily.   omeprazole (PRILOSEC) 40 MG capsule TAKE ONE CAPSULE BY MOUTH DAILY   QUEtiapine (SEROQUEL) 50 MG tablet Take 1 tablet (50 mg total) by mouth at bedtime.   ramipril (ALTACE) 10 MG capsule TAKE ONE CAPSULE BY MOUTH TWICE A DAY   No facility-administered encounter medications on file as of 09/19/2021.     ------------------------------------------------------------------------------------------- Advance Care Planning/Goals of Care: Goals include to  maximize quality of life and symptom management. Patient gave perimission to discuss.Our advance care planning conversation included a discussion about:    The value and importance of advance care planning  Experiences with loved ones who have been seriously ill or have died  Exploration of personal, cultural or spiritual beliefs that might influence medical decisions-"I don't want to be kept alive on machines." Exploration of goals of care in the event of a sudden injury or illness-wants to think about it and discuss with family Identification  of a healthcare agent-daughter Dr Guadlupe Spanish Review of an  advance directive document-MOST.  CODE STATUS: Currently full code    Thank you for the opportunity to participate in the care of Ms. Goupil.  The palliative care team will continue to follow. Please call our office at [phone] if we can be of additional assistance.   Marijo Conception, FNP-C  COVID-19 PATIENT SCREENING TOOL Asked and negative response unless otherwise noted:  Have you had symptoms of covid, tested positive or been in contact with someone with symptoms/positive test in the past 5-10 days? No

## 2021-09-21 DIAGNOSIS — Z515 Encounter for palliative care: Secondary | ICD-10-CM | POA: Insufficient documentation

## 2021-09-24 ENCOUNTER — Other Ambulatory Visit: Payer: Medicare Other | Admitting: *Deleted

## 2021-09-24 DIAGNOSIS — Z515 Encounter for palliative care: Secondary | ICD-10-CM

## 2021-09-24 NOTE — Progress Notes (Signed)
[facility] RN NOTE  PATIENT NAME: [name] DOB: [birth year] MRN: [id number]  PRIMARY CARE PROVIDER: Binnie Rail, MD  RESPONSIBLE PARTY: [name] (daughter) Acct ID - Guarantor Home Phone Work Phone Relationship Acct Type  [id number] ESSIE, LAGUNES* [phone]  Self P/F     [address], [place] [address]    RN telephonic encounter completed with patient's daughter [name]. She says patient is having more sundowning. She checked for a UTI last week with an OTC kit and it was negative so feels this is more likely disease progression. She has had to increase caregiver hours for more supervision. She has been more resistant and agitated. She was started on Depakote about 3 months ago at 125 mg twice daily. Initially it helped and she was not saying she was wanting to leave and more easily redirected. However now the behaviors are amping up again. She has moved the time she receives her nighttime medications from 8pm to 6pm which has helped some, but the time after dinner until 6pm she is saying she has to go to see her parents and much more difficult to redirect. Will discuss concerns with Susan Hippo FNP and contact [name] in the morning for follow up. She is appreciative.  (Duration of visit and documentation 15 minutes)   Daryl Eastern, RN BSN

## 2021-09-25 ENCOUNTER — Encounter: Payer: Self-pay | Admitting: Neurology

## 2021-09-25 DIAGNOSIS — R2689 Other abnormalities of gait and mobility: Secondary | ICD-10-CM | POA: Diagnosis not present

## 2021-09-25 DIAGNOSIS — R488 Other symbolic dysfunctions: Secondary | ICD-10-CM | POA: Diagnosis not present

## 2021-09-25 DIAGNOSIS — M5459 Other low back pain: Secondary | ICD-10-CM | POA: Diagnosis not present

## 2021-09-25 DIAGNOSIS — M6281 Muscle weakness (generalized): Secondary | ICD-10-CM | POA: Diagnosis not present

## 2021-10-02 DIAGNOSIS — M4316 Spondylolisthesis, lumbar region: Secondary | ICD-10-CM | POA: Diagnosis not present

## 2021-10-16 ENCOUNTER — Encounter: Payer: Self-pay | Admitting: Family Medicine

## 2021-10-16 ENCOUNTER — Other Ambulatory Visit: Payer: Medicare Other | Admitting: Family Medicine

## 2021-10-16 VITALS — BP 150/58 | HR 78 | Resp 16

## 2021-10-16 DIAGNOSIS — F01B3 Vascular dementia, moderate, with mood disturbance: Secondary | ICD-10-CM

## 2021-10-16 DIAGNOSIS — I10 Essential (primary) hypertension: Secondary | ICD-10-CM | POA: Diagnosis not present

## 2021-10-16 NOTE — Progress Notes (Signed)
[facility] Consult Note Telephone: [phone]  Fax: [phone]   Date of encounter: 10/16/21 4:45 PM PATIENT NAME: [name] [address] [place] [address]   [phone] (home)  DOB: 1930/08/26 MRN: [id number] PRIMARY CARE PROVIDER:    Binnie Rail, MD,  [place] [address] [phone]  REFERRING PROVIDER:   Binnie Rail, MD [address] [place],  [place] [address] [phone]  RESPONSIBLE PARTY:    Contact Information     Name Relation Home Work [place] Daughter   [phone]   Ayako, Tapanes   [phone]        I met face to face with patient and daughter Dr Guadlupe Spanish in patient's independent living facility. Palliative Care was asked to follow this patient by consultation request of  Burns, Claudina Lick, MD to address advance care planning and complex medical decision making. This is a follow up visit.          ASSESSMENT, SYMPTOM MANAGEMENT AND PLAN / RECOMMENDATIONS:   HTN Improved but not at goal Continue Carvedilol 6.25 mg BID with meals, Ramipril 10 mg daily and Norvasc 5 mg BID   2. Moderate Mixed Vascular and Neurodegenerative Dementia with mood disturbance (agitation and anxiety) Decrease Buspar to 15 mg daily (eliminate PM dosing) Improved mood stability since starting Depakote, no adverse effects.  Increase Depakote to 250 mg BID Will likely obtain depakote level and CBC around next visit.   Follow up Palliative Care Visit: Palliative care will continue to follow for complex medical decision making, advance care planning, and clarification of goals. Return 4 weeks or prn.    This visit was coded based on medical decision making (MDM).  PPS: 60%  HOSPICE ELIGIBILITY/DIAGNOSIS: TBD  Chief Complaint:  Palliative Care is following for chronic medical management in setting of dementia.  HISTORY OF PRESENT ILLNESS:   [name] is a 88 y.o. year old female with dementia, HTN, diabetes with peripheral vascular disease and neuropathy, CAD, aortic atherosclerosis, GERD, tubulovillous adenoma of the colon, degenerative arthritis of lumbar spine with cord compression, OA of left shoulder, sciatica, HLD, anemia, gout, depression, RUQ pain and lumbar canal stenosis. Endorses single fall to knee without injury.  Pt and daughter note stabilization of mood since increasing Depakote to 250 mg in am, 125 mg QHS.  Denies bleeding gums, epistaxis, blood in stools or recent infections. No coughing or choking after eating or drinking.  No CP, SOB. No dysuria.  Occasional constipation. Continent of bowel and bladder with intermittent urge continence, independent with ADLs.   Daughter states she has seen no change in pt's anxiety with Buspar and would like to de-escalate that medication. Will wean starting with the PM dose down to 15 mg daily.    History obtained from review of EMR, discussion with daughter who is [facility] POA and a psychiatrist and/or Ms. [place]church.  I reviewed available labs, medications, imaging, studies and related documents from the EMR with no new records since last visit.    ROS General: NAD Cardiovascular: denies chest pain, denies DOE Pulmonary: denies cough, denies increased SOB Abdomen: endorses good appetite, denies constipation, endorses continence of bowel GU: denies dysuria, endorses continence of urine with occasional urge incontinence MSK:  denies increased weakness, single fall reported Neurological: denies pain, denies insomnia Psych: Endorses positive mood, less anxiety and agitation Heme/lymph/immuno: denies bruises, abnormal bleeding  Physical Exam: Current and  past weights: 138 lbs 6.4 ounces as of 09/05/21, weight 06/19/21 was 139 lbs 9.6 ounces Constitutional: NAD General: WN, WD CV: S1S2, RRR, no LE edema Pulmonary: CTAB, no increased work of breathing, no cough, room air Abdomen:  normo-active BS + 4 quadrants, soft and non tender MSK: moves all extremities, ambulatory Neuro:  no generalized weakness, noted short term memory impairment and repetition Psych: non-anxious affect, A and O x 3 Hem/lymph/immuno: no widespread bruising  CURRENT PROBLEM LIST:  Patient Active Problem List   Diagnosis Date Noted   Palliative care encounter 09/21/2021   Urinary frequency 09/18/2020   Aortic atherosclerosis ([place]) 08/07/2020   Mild dementia ([place]) 07/27/2017   Chronic RUQ pain 05/28/2016   Right knee pain 06/14/2015   Diabetic neuropathy ([place]) 03/21/2014   Bunion 03/21/2014   PVD (pulmonary valve disease) 01/23/2014   Onychomycosis 12/20/2013   PVD (peripheral vascular disease) ([place]) 12/19/2013   Gout 12/13/2013   Depression    Lumbar canal stenosis 05/26/2013   Neuralgia neuritis, sciatic nerve 05/26/2013   Degenerative arthritis of lumbar spine with cord compression 04/24/2013   Arthritis of shoulder region, left 09/11/2011   Type II diabetes mellitus with neurological manifestations ([place]) 01/17/2009   Anemia 10/15/2008   CAD, NATIVE VESSEL 06/09/2008   Hyperlipidemia 06/15/2007   Essential hypertension 06/15/2007   GERD 06/15/2007   TUBULOVILLOUS ADENOMA, 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   DIVERTICULOSIS, 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   PAST MEDICAL HISTORY:  Active Ambulatory Problems    Diagnosis Date Noted   TUBULOVILLOUS ADENOMA, 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   Type II diabetes mellitus with neurological manifestations ([place]) 01/17/2009   Hyperlipidemia 06/15/2007   Anemia 10/15/2008   Essential hypertension 06/15/2007   CAD, NATIVE VESSEL 06/09/2008   GERD 06/15/2007   DIVERTICULOSIS, 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   Arthritis of shoulder region, left 09/11/2011   Degenerative arthritis of lumbar spine with cord compression 04/24/2013   Depression    Gout 12/13/2013   PVD (peripheral vascular disease) ([place]) 12/19/2013   Onychomycosis 12/20/2013   PVD (pulmonary valve disease) 01/23/2014   Diabetic  neuropathy ([place]) 03/21/2014   Bunion 03/21/2014   Right knee pain 06/14/2015   Chronic RUQ pain 05/28/2016   Mild dementia ([place]) 07/27/2017   Aortic atherosclerosis ([place]) 08/07/2020   Urinary frequency 09/18/2020   Lumbar canal stenosis 05/26/2013   Neuralgia neuritis, sciatic nerve 05/26/2013   Palliative care encounter 09/21/2021   Resolved Ambulatory Problems    Diagnosis Date Noted   Dermatophytosis of nail 02/20/2009   HEMORRHOIDS, INTERNAL 03/02/2007   GASTRITIS 03/12/2009   HIATAL HERNIA 10/29/2000   UNSPECIFIED HEMORRHAGE OF GASTROINTESTINAL TRACT 12/21/2008   DRY SKIN 02/20/2009   ARTHRITIS 06/15/2007   Enthesopathy of hip region 11/28/2009   Fibromyalgia syndrome 06/15/2007   INSOMNIA-SLEEP DISORDER-UNSPEC 09/18/2009   Headache(784.0) 04/08/2009   PALPITATIONS, RECURRENT 04/15/2009   Wheezing 09/18/2009   FECAL OCCULT BLOOD 01/16/2009   COLONIC POLYPS, HX OF 09/18/2008   Memory loss 07/23/2010   Palpitations 07/06/2012   Sciatica 09/05/2012   Sacroiliac joint dysfunction of right side 03/22/2013   Non-compliant behavior 12/05/2013   Radiculopathy of lumbar region 01/31/2014   HAV (hallux abducto valgus) 03/21/2014   Tenosynovitis of ankle 03/21/2014   Pronation deformity of ankle, acquired 03/21/2014   Chest pain 04/17/2014   Pain in lower limb 07/18/2014   Non-compliant behavior 11/23/2014   Acute bronchitis 12/03/2014   Cough 05/02/2015   Abdominal pain, epigastric 09/02/2015   AP (abdominal pain) 09/02/2015   Mild cognitive impairment 12/12/2015   Buttock pain  10/30/2016   Abscess of right foot 12/02/2017   Cellulitis of foot 12/05/2017   Benign hypertension 01/04/2014   Past Medical History:  Diagnosis Date   DIABETES MELLITUS, TYPE II    FIBROMYALGIA    Gout    HYPERLIPIDEMIA    HYPERTENSION    Insomnia    OA (osteoarthritis)    S/P CABG x 1    S/P inguinal hernia repair    UNSPECIFIED ANEMIA    SOCIAL HX:  Social History   Tobacco Use    Smoking status: Former    Types: Cigarettes    Quit date: 04/26/1970    Years since quitting: 51.5   Smokeless tobacco: Never  Substance Use Topics   Alcohol use: Not Currently    Alcohol/week: 1.0 standard drink of alcohol    Types: 1 Glasses of wine per week   FAMILY HX:  Family History  Problem Relation Age of Onset   Cancer Mother        Stomach   Stomach cancer Mother 70   Hypertension Mother    Heart disease Father 50       MI   Diabetes Father    Heart attack Father    Vascular Disease Sister    Stroke Sister    Dementia Sister    Diabetes Sister    Parkinsonism Sister    Stroke Sister    Heart disease Sister    Heart attack Brother    Stroke Daughter    Diabetes Daughter    Hypertension Daughter    Colon cancer Neg Hx        Preferred Pharmacy: ALLERGIES: No Known Allergies   PERTINENT MEDICATIONS:  Outpatient Encounter Medications as of 10/16/2021  Medication Sig   acetaminophen (TYLENOL) 500 MG tablet Take 1,000 mg by mouth 2 (two) times daily.    amLODipine (NORVASC) 5 MG tablet TAKE ONE TABLET BY MOUTH TWICE A DAY   aspirin EC 81 MG tablet Take 81 mg by mouth daily.   busPIRone (BUSPAR) 15 MG tablet Take 1 tablet (15 mg total) by mouth 2 (two) times daily.   carvedilol (COREG) 6.25 MG tablet TAKE ONE TABLET BY MOUTH TWICE A DAY   cetirizine (ZYRTEC) 10 MG tablet TAKE ONE TABLET BY MOUTH DAILY (Patient taking differently: Take 10 mg by mouth at bedtime.)   Cholecalciferol (D3-1000) 25 MCG (1000 UT) capsule Take 1,000 Units by mouth daily.   diclofenac sodium (VOLTAREN) 1 % GEL Apply 2 g topically 4 (four) times daily as needed (pain).   divalproex (DEPAKOTE) 125 MG DR tablet Take 1 tablet every night for 1 week, then increase to 1 tablet twice a day   donepezil (ARICEPT) 10 MG tablet TAKE ONE TABLET BY MOUTH DAILY   DULoxetine (CYMBALTA) 60 MG capsule Take 1 capsule (60 mg total) by mouth daily.   ferrous sulfate 324 MG TBEC Take 324 mg by mouth daily.    gabapentin (NEURONTIN) 300 MG capsule TAKE ONE CAPSULE BY MOUTH EVERY NIGHT AT BEDTIME   MELATONIN PO Take 3 mg by mouth at bedtime as needed.   meloxicam (MOBIC) 7.5 MG tablet TAKE ONE TABLET BY MOUTH DAILY   memantine (NAMENDA) 10 MG tablet Take 1 tablet (10 mg total) by mouth 2 (two) times daily.   omeprazole (PRILOSEC) 40 MG capsule TAKE ONE CAPSULE BY MOUTH DAILY   QUEtiapine (SEROQUEL) 50 MG tablet Take 1 tablet (50 mg total) by mouth at bedtime.   ramipril (ALTACE) 10 MG capsule TAKE  ONE CAPSULE BY MOUTH TWICE A DAY   No facility-administered encounter medications on file as of 10/16/2021.       CODE STATUS: Currently full code    Thank you for the opportunity to participate in the care of Ms. Zubiate.  The palliative care team will continue to follow. Please call our office at [phone] if we can be of additional assistance.   Marijo Conception, FNP-C  COVID-19 PATIENT SCREENING TOOL Asked and negative response unless otherwise noted:  Have you had symptoms of covid, tested positive or been in contact with someone with symptoms/positive test in the past 5-10 days? No

## 2021-10-30 DIAGNOSIS — M47816 Spondylosis without myelopathy or radiculopathy, lumbar region: Secondary | ICD-10-CM | POA: Diagnosis not present

## 2021-11-06 DIAGNOSIS — E1151 Type 2 diabetes mellitus with diabetic peripheral angiopathy without gangrene: Secondary | ICD-10-CM | POA: Diagnosis not present

## 2021-11-06 DIAGNOSIS — L603 Nail dystrophy: Secondary | ICD-10-CM | POA: Diagnosis not present

## 2021-11-06 DIAGNOSIS — L84 Corns and callosities: Secondary | ICD-10-CM | POA: Diagnosis not present

## 2021-11-06 DIAGNOSIS — I739 Peripheral vascular disease, unspecified: Secondary | ICD-10-CM | POA: Diagnosis not present

## 2021-11-13 ENCOUNTER — Non-Acute Institutional Stay: Payer: Medicare Other | Admitting: Family Medicine

## 2021-11-13 ENCOUNTER — Encounter: Payer: Self-pay | Admitting: Family Medicine

## 2021-11-13 VITALS — BP 140/70 | HR 60 | Resp 18

## 2021-11-13 DIAGNOSIS — I10 Essential (primary) hypertension: Secondary | ICD-10-CM | POA: Diagnosis not present

## 2021-11-13 DIAGNOSIS — F01B3 Vascular dementia, moderate, with mood disturbance: Secondary | ICD-10-CM

## 2021-11-13 DIAGNOSIS — M47816 Spondylosis without myelopathy or radiculopathy, lumbar region: Secondary | ICD-10-CM | POA: Diagnosis not present

## 2021-11-13 DIAGNOSIS — M4316 Spondylolisthesis, lumbar region: Secondary | ICD-10-CM | POA: Diagnosis not present

## 2021-11-13 NOTE — Progress Notes (Signed)
Therapist, nutritional Palliative Care Consult Note Telephone: [phone]  Fax: [phone]   Date of encounter: 11/13/21 4:45 PM PATIENT NAME: [name] [address] [place] [address]   [phone] (home)  DOB: Sep 12, 1930 MRN: [id number] PRIMARY CARE PROVIDER:    Pincus Sanes, MD,  [address] [place] [address] [phone]  REFERRING PROVIDER:   Pincus Sanes, MD [address] [place],  [place] [address] [phone]  RESPONSIBLE PARTY:    Contact Information     Name Relation Home Work [name] Daughter   [phone]   [name], Ramnath   [phone]        I met face to face with patient and daughter Dr Gardiner Ramus in patient's independent living facility. Palliative Care was asked to follow this patient by consultation request of  Burns, Bobette Mo, MD to address advance care planning and complex medical decision making. This is a follow up visit.          ASSESSMENT, SYMPTOM MANAGEMENT AND PLAN / RECOMMENDATIONS:   HTN stable Continue Carvedilol 6.25 mg BID with meals, Ramipril 10 mg daily and Norvasc 5 mg BID   2. Moderate Mixed Vascular and Neurodegenerative Dementia with mood disturbance (agitation and anxiety) FAST 7 score 6b Continues on Buspar 15 mg BID Improved mood stability since starting/titrating Depakote, no adverse effects.  Continue Depakote 250 mg BID Recommend depakote level and CBC around next visit.   Follow up Palliative Care Visit: Palliative care will continue to follow for complex medical decision making, advance care planning, and clarification of goals. Return 4 weeks or prn.    This visit was coded based on medical decision making (MDM).  PPS: 60%  HOSPICE ELIGIBILITY/DIAGNOSIS: TBD  Chief Complaint:  Palliative Care is following for chronic medical management in setting of dementia.  HISTORY OF PRESENT ILLNESS:  [name] is a  88 y.o. year old female with dementia, HTN, diabetes with peripheral vascular disease and neuropathy, CAD, aortic atherosclerosis, GERD, tubulovillous adenoma of the colon, degenerative arthritis of lumbar spine with cord compression, OA of left shoulder, sciatica, HLD, anemia, gout, depression, RUQ pain and lumbar canal stenosis. Continues on Buspar 15 mg BID.  Per daughter, mood has been stable since titrating to Depakote 250 mg BID without adverse effects.  She states in the evenings pt is not remembering that her mom and dad are gone.  Has been sleeping good, no falls. No coughing or choking after eating or drinking. No cough, SOB. No dysuria or constipation. Eating well. Daughter states she has not tried to walk away recently.    History obtained from review of EMR, discussion with daughter who is [facility] POA/psychiatrist and/or [name].   I reviewed available labs, medications, imaging, studies and related documents from the EMR with no new records since last visit.    ROS General: NAD Cardiovascular: denies chest pain, denies DOE Pulmonary: denies cough, denies increased SOB Abdomen: endorses good appetite, denies constipation, endorses continence of bowel GU: denies dysuria, endorses continence of urine with occasional urge incontinence MSK:  denies increased weakness, no fall reported Neurological: denies pain, denies insomnia Psych: Endorses positive mood, less anxiety and agitation Heme/lymph/immuno: denies bruises, abnormal bleeding  Physical Exam: Current and past weights: 138 lbs 6.4 ounces as of 09/05/21, weight 06/19/21 was 139 lbs 9.6 ounces Constitutional: NAD General: WN, WD CV: S1S2, RRR, no LE edema Pulmonary: CTAB, no increased work of  breathing, no cough, room air Abdomen: normo-active BS + 4 quadrants, soft and non tender MSK: moves all extremities, ambulatory Neuro:  no generalized weakness Psych: non-anxious affect, A and O x 3 Hem/lymph/immuno: no widespread  bruising  CURRENT PROBLEM LIST:  Patient Active Problem List   Diagnosis Date Noted   Palliative care encounter 09/21/2021   Urinary frequency 09/18/2020   Aortic atherosclerosis ([place]) 08/07/2020   Moderate mixed vascular and neurodegenerative dementia with mood disturbance (HCC) 07/27/2017   Chronic RUQ pain 05/28/2016   Right knee pain 06/14/2015   Diabetic neuropathy ([place]) 03/21/2014   Bunion 03/21/2014   PVD (pulmonary valve disease) 01/23/2014   Onychomycosis 12/20/2013   PVD (peripheral vascular disease) ([place]) 12/19/2013   Gout 12/13/2013   Depression    Lumbar canal stenosis 05/26/2013   Neuralgia neuritis, sciatic nerve 05/26/2013   Degenerative arthritis of lumbar spine with cord compression 04/24/2013   Arthritis of shoulder region, left 09/11/2011   Type II diabetes mellitus with neurological manifestations ([place]) 01/17/2009   Anemia 10/15/2008   CAD, NATIVE VESSEL 06/09/2008   Hyperlipidemia 06/15/2007   Essential hypertension 06/15/2007   GERD 06/15/2007   TUBULOVILLOUS ADENOMA, 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   DIVERTICULOSIS, 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   PAST MEDICAL HISTORY:  Active Ambulatory Problems    Diagnosis Date Noted   TUBULOVILLOUS ADENOMA, 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   Type II diabetes mellitus with neurological manifestations ([place]) 01/17/2009   Hyperlipidemia 06/15/2007   Anemia 10/15/2008   Essential hypertension 06/15/2007   CAD, NATIVE VESSEL 06/09/2008   GERD 06/15/2007   DIVERTICULOSIS, 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   Arthritis of shoulder region, left 09/11/2011   Degenerative arthritis of lumbar spine with cord compression 04/24/2013   Depression    Gout 12/13/2013   PVD (peripheral vascular disease) ([place]) 12/19/2013   Onychomycosis 12/20/2013   PVD (pulmonary valve disease) 01/23/2014   Diabetic neuropathy ([place]) 03/21/2014   Bunion 03/21/2014   Right knee pain 06/14/2015   Chronic RUQ pain 05/28/2016   Moderate mixed vascular and neurodegenerative dementia with mood disturbance  ([place]) 07/27/2017   Aortic atherosclerosis ([place]) 08/07/2020   Urinary frequency 09/18/2020   Lumbar canal stenosis 05/26/2013   Neuralgia neuritis, sciatic nerve 05/26/2013   Palliative care encounter 09/21/2021   Resolved Ambulatory Problems    Diagnosis Date Noted   Dermatophytosis of nail 02/20/2009   HEMORRHOIDS, INTERNAL 03/02/2007   GASTRITIS 03/12/2009   HIATAL HERNIA 10/29/2000   UNSPECIFIED HEMORRHAGE OF GASTROINTESTINAL TRACT 12/21/2008   DRY SKIN 02/20/2009   ARTHRITIS 06/15/2007   Enthesopathy of hip region 11/28/2009   Fibromyalgia syndrome 06/15/2007   INSOMNIA-SLEEP DISORDER-UNSPEC 09/18/2009   Headache(784.0) 04/08/2009   PALPITATIONS, RECURRENT 04/15/2009   Wheezing 09/18/2009   FECAL OCCULT BLOOD 01/16/2009   COLONIC POLYPS, HX OF 09/18/2008   Memory loss 07/23/2010   Palpitations 07/06/2012   Sciatica 09/05/2012   Sacroiliac joint dysfunction of right side 03/22/2013   Non-compliant behavior 12/05/2013   Radiculopathy of lumbar region 01/31/2014   HAV (hallux abducto valgus) 03/21/2014   Tenosynovitis of ankle 03/21/2014   Pronation deformity of ankle, acquired 03/21/2014   Chest pain 04/17/2014   Pain in lower limb 07/18/2014   Non-compliant behavior 11/23/2014   Acute bronchitis 12/03/2014   Cough 05/02/2015   Abdominal pain, epigastric 09/02/2015   AP (abdominal pain) 09/02/2015   Mild cognitive impairment 12/12/2015   Buttock pain 10/30/2016   Abscess of right foot 12/02/2017   Cellulitis of foot 12/05/2017   Benign hypertension 01/04/2014   Past Medical History:  Diagnosis  Date   DIABETES MELLITUS, TYPE II    FIBROMYALGIA    Gout    HYPERLIPIDEMIA    HYPERTENSION    Insomnia    OA (osteoarthritis)    S/P CABG x 1    S/P inguinal hernia repair    UNSPECIFIED ANEMIA    SOCIAL HX:  Social History   Tobacco Use   Smoking status: Former    Types: Cigarettes    Quit date: 04/26/1970    Years since quitting: 51.5   Smokeless tobacco:  Never  Substance Use Topics   Alcohol use: Not Currently    Alcohol/week: 1.0 standard drink of alcohol    Types: 1 Glasses of wine per week   FAMILY HX:  Family History  Problem Relation Age of Onset   Cancer Mother        Stomach   Stomach cancer Mother 73   Hypertension Mother    Heart disease Father 93       MI   Diabetes Father    Heart attack Father    Vascular Disease Sister    Stroke Sister    Dementia Sister    Diabetes Sister    Parkinsonism Sister    Stroke Sister    Heart disease Sister    Heart attack Brother    Stroke Daughter    Diabetes Daughter    Hypertension Daughter    Colon cancer Neg Hx        Preferred Pharmacy: ALLERGIES: No Known Allergies   PERTINENT MEDICATIONS:  Outpatient Encounter Medications as of 11/13/2021  Medication Sig   acetaminophen (TYLENOL) 500 MG tablet Take 1,000 mg by mouth 2 (two) times daily.    amLODipine (NORVASC) 5 MG tablet TAKE ONE TABLET BY MOUTH TWICE A DAY   aspirin EC 81 MG tablet Take 81 mg by mouth daily.   busPIRone (BUSPAR) 15 MG tablet Take 1 tablet (15 mg total) by mouth 2 (two) times daily.   carvedilol (COREG) 6.25 MG tablet TAKE ONE TABLET BY MOUTH TWICE A DAY   cetirizine (ZYRTEC) 10 MG tablet TAKE ONE TABLET BY MOUTH DAILY (Patient taking differently: Take 10 mg by mouth at bedtime.)   Cholecalciferol (D3-1000) 25 MCG (1000 UT) capsule Take 1,000 Units by mouth daily.   diclofenac sodium (VOLTAREN) 1 % GEL Apply 2 g topically 4 (four) times daily as needed (pain).   divalproex (DEPAKOTE) 125 MG DR tablet Take 125 mg by mouth 2 (two) times daily.   donepezil (ARICEPT) 10 MG tablet TAKE ONE TABLET BY MOUTH DAILY   DULoxetine (CYMBALTA) 60 MG capsule Take 1 capsule (60 mg total) by mouth daily.   ferrous sulfate 324 MG TBEC Take 324 mg by mouth daily.   gabapentin (NEURONTIN) 300 MG capsule TAKE ONE CAPSULE BY MOUTH EVERY NIGHT AT BEDTIME   MELATONIN PO Take 3 mg by mouth at bedtime as needed.   meloxicam  (MOBIC) 7.5 MG tablet TAKE ONE TABLET BY MOUTH DAILY   memantine (NAMENDA) 10 MG tablet Take 1 tablet (10 mg total) by mouth 2 (two) times daily.   omeprazole (PRILOSEC) 40 MG capsule TAKE ONE CAPSULE BY MOUTH DAILY   QUEtiapine (SEROQUEL) 50 MG tablet Take 1 tablet (50 mg total) by mouth at bedtime.   ramipril (ALTACE) 10 MG capsule TAKE ONE CAPSULE BY MOUTH TWICE A DAY   No facility-administered encounter medications on file as of 11/13/2021.       CODE STATUS: Currently full code    Thank  you for the opportunity to participate in the care of Ms. Medel.  The palliative care team will continue to follow. Please call our office at [phone] if we can be of additional assistance.   Marijo Conception, FNP-C  COVID-19 PATIENT SCREENING TOOL Asked and negative response unless otherwise noted:  Have you had symptoms of covid, tested positive or been in contact with someone with symptoms/positive test in the past 5-10 days? No

## 2021-12-11 ENCOUNTER — Non-Acute Institutional Stay: Payer: Medicare Other | Admitting: Family Medicine

## 2021-12-11 ENCOUNTER — Encounter: Payer: Self-pay | Admitting: Neurology

## 2021-12-12 ENCOUNTER — Non-Acute Institutional Stay: Payer: Medicare Other | Admitting: Family Medicine

## 2021-12-12 ENCOUNTER — Ambulatory Visit (INDEPENDENT_AMBULATORY_CARE_PROVIDER_SITE_OTHER): Payer: Medicare Other | Admitting: Neurology

## 2021-12-12 ENCOUNTER — Encounter: Payer: Self-pay | Admitting: Neurology

## 2021-12-12 VITALS — BP 132/60 | HR 58 | Resp 20

## 2021-12-12 VITALS — BP 139/65 | HR 62 | Resp 20 | Ht 59.0 in | Wt 133.0 lb

## 2021-12-12 DIAGNOSIS — F32A Depression, unspecified: Secondary | ICD-10-CM | POA: Diagnosis not present

## 2021-12-12 DIAGNOSIS — F419 Anxiety disorder, unspecified: Secondary | ICD-10-CM

## 2021-12-12 DIAGNOSIS — F01B3 Vascular dementia, moderate, with mood disturbance: Secondary | ICD-10-CM

## 2021-12-12 DIAGNOSIS — I25810 Atherosclerosis of coronary artery bypass graft(s) without angina pectoris: Secondary | ICD-10-CM | POA: Diagnosis not present

## 2021-12-12 MED ORDER — DONEPEZIL HCL 10 MG PO TABS: ORAL_TABLET | ORAL | 3 refills | Status: DC

## 2021-12-12 MED ORDER — QUETIAPINE FUMARATE 50 MG PO TABS: 50.0000 mg | ORAL_TABLET | Freq: Every day | ORAL | 3 refills | Status: DC

## 2021-12-12 MED ORDER — DIVALPROEX SODIUM 125 MG PO DR TAB
DELAYED_RELEASE_TABLET | ORAL | 3 refills | Status: DC
Start: 2021-12-12 — End: 2022-01-22

## 2021-12-12 MED ORDER — DULOXETINE HCL 60 MG PO CPEP: 60.0000 mg | ORAL_CAPSULE | Freq: Every day | ORAL | 3 refills | Status: DC

## 2021-12-12 MED ORDER — MEMANTINE HCL 10 MG PO TABS: 10.0000 mg | ORAL_TABLET | Freq: Two times a day (BID) | ORAL | 3 refills | Status: DC

## 2021-12-12 MED ORDER — BUSPIRONE HCL 15 MG PO TABS: 15.0000 mg | ORAL_TABLET | Freq: Two times a day (BID) | ORAL | 3 refills | Status: DC
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
## 2021-12-12 NOTE — Patient Instructions (Signed)
Always good to see you.  Increase Depakote '125mg'$ : Take 3 capsules twice a day (or may take 2 caps three times a day)  2. Continue all your other medications  3. Follow-up in 6 months, call for any changes   FALL PRECAUTIONS: Be cautious when walking. Scan the area for obstacles that may increase the risk of trips and falls. When getting up in the mornings, sit up at the edge of the bed for a few minutes before getting out of bed. Consider elevating the bed at the head end to avoid drop of blood pressure when getting up. Walk always in a well-lit room (use night lights in the walls). Avoid area rugs or power cords from appliances in the middle of the walkways. Use a walker or a cane if necessary and consider physical therapy for balance exercise. Get your eyesight checked regularly.  HOME SAFETY: Consider the safety of the kitchen when operating appliances like stoves, microwave oven, and blender. Consider having supervision and share cooking responsibilities until no longer able to participate in those. Accidents with firearms and other hazards in the house should be identified and addressed as well.  ABILITY TO BE LEFT ALONE: If patient is unable to contact 911 operator, consider using LifeLine, or when the need is there, arrange for someone to stay with patients. Smoking is a fire hazard, consider supervision or cessation. Risk of wandering should be assessed by caregiver and if detected at any point, supervision and safe proof recommendations should be instituted.   RECOMMENDATIONS FOR ALL PATIENTS WITH MEMORY PROBLEMS: 1. Continue to exercise (Recommend 30 minutes of walking everyday, or 3 hours every week) 2. Increase social interactions - continue going to [place] and enjoy social gatherings with friends and family 3. Eat healthy, avoid fried foods and eat more fruits and vegetables 4. Maintain adequate blood pressure, blood sugar, and blood cholesterol level. Reducing the risk of stroke and  cardiovascular disease also helps promoting better memory. 5. Avoid stressful situations. Live a simple life and avoid aggravations. Organize your time and prepare for the next day in anticipation. 6. Sleep well, avoid any interruptions of sleep and avoid any distractions in the bedroom that may interfere with adequate sleep quality 7. Avoid sugar, avoid sweets as there is a strong link between excessive sugar intake, diabetes, and cognitive impairment The Mediterranean diet has been shown to help patients reduce the risk of progressive memory disorders and reduces cardiovascular risk. This includes eating fish, eat fruits and green leafy vegetables, nuts like almonds and hazelnuts, walnuts, and also use olive oil. Avoid fast foods and fried foods as much as possible. Avoid sweets and sugar as sugar use has been linked to worsening of memory function.  There is always a concern of gradual progression of memory problems. If this is the case, then we may need to adjust level of care according to patient needs. Support, both to the patient and caregiver, should then be put into place.
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
## 2021-12-13 ENCOUNTER — Encounter: Payer: Self-pay | Admitting: Family Medicine

## 2021-12-19 DIAGNOSIS — M4316 Spondylolisthesis, lumbar region: Secondary | ICD-10-CM | POA: Diagnosis not present

## 2021-12-19 DIAGNOSIS — M47816 Spondylosis without myelopathy or radiculopathy, lumbar region: Secondary | ICD-10-CM | POA: Diagnosis not present

## 2021-12-20 ENCOUNTER — Emergency Department (HOSPITAL_BASED_OUTPATIENT_CLINIC_OR_DEPARTMENT_OTHER)
Admission: EM | Admit: 2021-12-20 | Discharge: 2021-12-20 | Disposition: A | Discharge status: Home / Self Care | Payer: Medicare Other | Attending: Emergency Medicine | Admitting: Emergency Medicine

## 2021-12-20 ENCOUNTER — Encounter (HOSPITAL_BASED_OUTPATIENT_CLINIC_OR_DEPARTMENT_OTHER): Payer: Self-pay

## 2021-12-20 DIAGNOSIS — R197 Diarrhea, unspecified: Secondary | ICD-10-CM | POA: Insufficient documentation

## 2021-12-20 DIAGNOSIS — Z79899 Other long term (current) drug therapy: Secondary | ICD-10-CM | POA: Diagnosis not present

## 2021-12-20 DIAGNOSIS — Z7982 Long term (current) use of aspirin: Secondary | ICD-10-CM | POA: Diagnosis not present

## 2021-12-20 DIAGNOSIS — R195 Other fecal abnormalities: Secondary | ICD-10-CM

## 2021-12-20 DIAGNOSIS — K625 Hemorrhage of anus and rectum: Secondary | ICD-10-CM | POA: Diagnosis not present

## 2021-12-20 DIAGNOSIS — K921 Melena: Secondary | ICD-10-CM | POA: Diagnosis not present

## 2021-12-20 DIAGNOSIS — F0390 Unspecified dementia without behavioral disturbance: Secondary | ICD-10-CM | POA: Insufficient documentation

## 2021-12-20 DIAGNOSIS — I10 Essential (primary) hypertension: Secondary | ICD-10-CM | POA: Insufficient documentation

## 2021-12-20 LAB — URINALYSIS, ROUTINE W REFLEX MICROSCOPIC
Bilirubin Urine: NEGATIVE
Glucose, UA: NEGATIVE mg/dL
Hgb urine dipstick: NEGATIVE
Ketones, ur: NEGATIVE mg/dL
Leukocytes,Ua: NEGATIVE
Nitrite: NEGATIVE
Specific Gravity, Urine: 1.015 (ref 1.005–1.030)
pH: 7 (ref 5.0–8.0)

## 2021-12-20 LAB — CBC
HCT: 41.3 % (ref 36.0–46.0)
Hemoglobin: 13.6 g/dL (ref 12.0–15.0)
MCH: 31.7 pg (ref 26.0–34.0)
MCHC: 32.9 g/dL (ref 30.0–36.0)
MCV: 96.3 fL (ref 80.0–100.0)
Platelets: 125 10*3/uL — ABNORMAL LOW (ref 150–400)
RBC: 4.29 MIL/uL (ref 3.87–5.11)
RDW: 12.7 % (ref 11.5–15.5)
WBC: 5 10*3/uL (ref 4.0–10.5)
nRBC: 0 % (ref 0.0–0.2)

## 2021-12-20 LAB — COMPREHENSIVE METABOLIC PANEL
ALT: 7 U/L (ref 0–44)
AST: 17 U/L (ref 15–41)
Albumin: 4 g/dL (ref 3.5–5.0)
Alkaline Phosphatase: 54 U/L (ref 38–126)
Anion gap: 9 (ref 5–15)
BUN: 14 mg/dL (ref 8–23)
CO2: 30 mmol/L (ref 22–32)
Calcium: 9.5 mg/dL (ref 8.9–10.3)
Chloride: 100 mmol/L (ref 98–111)
Creatinine, Ser: 0.77 mg/dL (ref 0.44–1.00)
GFR, Estimated: >60 mL/min (ref >60)
Glucose, Bld: 88 mg/dL (ref 70–99)
Potassium: 3.9 mmol/L (ref 3.5–5.1)
Sodium: 139 mmol/L (ref 135–145)
Total Bilirubin: 0.7 mg/dL (ref 0.3–1.2)
Total Protein: 6.6 g/dL (ref 6.5–8.1)

## 2021-12-20 LAB — OCCULT BLOOD X 1 CARD TO LAB, STOOL: Fecal Occult Bld: NEGATIVE

## 2021-12-20 NOTE — ED Notes (Signed)
Dc instructions reviewed with patient. Patient voiced understanding. Dc with belongings. Pt has dementia, easily frustrated, did not want vs. Wanted to go home

## 2021-12-20 NOTE — ED Triage Notes (Signed)
Pt had med changes to her depakote last week. Pt has had diarrhea since, pt had some black stools, as well as bright red.   Pt also had nerve ablation yesterday, was given toradol.

## 2021-12-20 NOTE — ED Provider Notes (Signed)
[facility]ERGENC[facility] DEPT Provider Note   CSN: [id number] Arrival date & time: 12/20/21  1337     History  Chief Complaint  Patient presents with   Rectal Bleeding    [name] Date is a 88 y.o. female with past medical history significant for dementia, diabetes, hyperlipidemia, hypertension, anemia, diverticulosis, advanced age who presents with concern for diarrhea, dark tarry stools, occasional bright red stools for around 1 to 2 weeks.  Daughter who is present with patient is a physician, reports that she recently increased her dose of Depakote and then began having these new diarrhea.  Patient denies any abdominal pain, is not in any discomfort on my evaluation.  She is worried about an upper GI bleed or lower GI bleed due to the consistency of her stool.  She denies any Pepto-Bismol, iron supplementation, she had taken 1 dose of Imodium prior to arrival today.  She has not had any fever, chills, cough, wheezing, shortness of breath.  It has been many years since patient's last colonoscopy but no previous history of colon cancer, she did have a tubulovillous adenoma previously.   Rectal Bleeding      Home Medications Prior to Admission medications   Medication Sig Start Date End Date Taking? Authorizing Provider  acetaminophen (TYLENOL) 500 MG tablet Take 1,000 mg by mouth 2 (two) times daily.     [provider]  amLODipine (NORVASC) 5 MG tablet TAKE ONE TABLET BY MOUTH TWICE A DAY 09/02/21   Binnie Rail, MD  aspirin EC 81 MG tablet Take 81 mg by mouth daily.    [provider]  busPIRone (BUSPAR) 15 MG tablet Take 1 tablet (15 mg total) by mouth 2 (two) times daily. 12/12/21   Cameron Sprang, MD  carvedilol (COREG) 6.25 MG tablet TAKE ONE TABLET BY MOUTH TWICE A DAY 09/19/21   Fay Records, MD  cetirizine (ZYRTEC) 10 MG tablet TAKE ONE TABLET BY MOUTH DAILY Patient taking differently: Take 10 mg by mouth at bedtime. 02/22/17   Binnie Rail, MD  Cholecalciferol (D3-1000) 25 MCG (1000 UT) capsule Take 1,000 Units by mouth daily.    [provider]  diclofenac sodium (VOLTAREN) 1 % GEL Apply 2 g topically 4 (four) times daily as needed (pain). 08/25/18   Binnie Rail, MD  divalproex (DEPAKOTE) 125 MG DR tablet Take 3 tablets twice a day 12/12/21   Cameron Sprang, MD  donepezil (ARICEPT) 10 MG tablet TAKE ONE TABLET BY MOUTH DAILY 12/12/21   Cameron Sprang, MD  DULoxetine (CYMBALTA) 60 MG capsule Take 1 capsule (60 mg total) by mouth daily. 12/12/21   Cameron Sprang, MD  ferrous sulfate 324 MG TBEC Take 324 mg by mouth daily.    [provider]  gabapentin (NEURONTIN) 300 MG capsule TAKE ONE CAPSULE BY MOUTH EVERY NIGHT AT BEDTIME 09/02/21   Burns, Claudina Lick, MD  MELATONIN PO Take 3 mg by mouth at bedtime as needed.    [provider]  meloxicam (MOBIC) 7.5 MG tablet TAKE ONE TABLET BY MOUTH DAILY 12/26/20   Binnie Rail, MD  memantine (NAMENDA) 10 MG tablet Take 1 tablet (10 mg total) by mouth 2 (two) times daily. 12/12/21   Cameron Sprang, MD  omeprazole (PRILOSEC) 40 MG capsule TAKE ONE CAPSULE BY MOUTH DAILY 08/19/21   Binnie Rail, MD  QUEtiapine (SEROQUEL) 50 MG tablet Take 1 tablet (50 mg total) by mouth at bedtime. 12/12/21  Cameron Sprang, MD  ramipril (ALTACE) 10 MG capsule TAKE ONE CAPSULE BY MOUTH TWICE A DAY 08/06/21   Binnie Rail, MD      Allergies    Patient has no known allergies.    Review of Systems   Review of Systems  Gastrointestinal:  Positive for hematochezia.  All other systems reviewed and are negative.   Physical Exam Updated Vital Signs BP (!) 184/77   Pulse (!) 57   Temp 98.2 F (36.8 C) (Oral)   Resp 12   Ht '4\' 11"'$  (1.499 m)   Wt 60.3 kg   SpO2 95%   BMI 26.86 kg/m  Physical Exam Vitals and nursing note reviewed.  Constitutional:      General: She is not in acute distress.    Appearance: Normal appearance.  HENT:     Head: Normocephalic and atraumatic.   Eyes:     General:        Right eye: No discharge.        Left eye: No discharge.  Cardiovascular:     Rate and Rhythm: Normal rate and regular rhythm.     Heart sounds: No murmur heard.    No friction rub. No gallop.  Pulmonary:     Effort: Pulmonary effort is normal.     Breath sounds: Normal breath sounds.  Abdominal:     General: Bowel sounds are normal.     Palpations: Abdomen is soft.     Comments: No tenderness palpation throughout abdomen, no rebound, rigidity, guarding, normal bowel sounds throughout  Genitourinary:    Comments: Normal appearance of external rectum, patient does have grossly melanotic stool, normal tone of rectal vault Skin:    General: Skin is warm and dry.     Capillary Refill: Capillary refill takes less than 2 seconds.  Neurological:     Mental Status: She is alert and oriented to person, place, and time.  Psychiatric:        Mood and Affect: Mood normal.        Behavior: Behavior normal.     ED Results / Procedures / Treatments   Labs (all labs ordered are listed, but only abnormal results are displayed) Labs Reviewed  CBC - Abnormal; Notable for the following components:      Result Value   Platelets 125 (*)    All other components within normal limits  URINALYSIS, ROUTINE W REFLEX MICROSCOPIC - Abnormal; Notable for the following components:   Protein, ur TRACE (*)    All other components within normal limits  COMPREHENSIVE METABOLIC PANEL  OCCULT BLOOD X 1 CARD TO LAB, STOOL  POC OCCULT BLOOD, ED    EKG EKG Interpretation  Date/Time:  Saturday December 20 2021 14:38:18 EDT Ventricular Rate:  63 PR Interval:  139 QRS Duration: 101 QT Interval:  486 QTC Calculation: 498 R Axis:   -2 Text Interpretation: Sinus rhythm Nonspecific T abnrm, anterolateral leads Confirmed by Octaviano Glow [phone]) on 12/20/2021 2:39:37 PM  Radiology No results found.  Procedures Procedures    Medications Ordered in ED Medications - No data to  display  ED Course/ Medical Decision Making/ A&P                           Medical Decision Making Amount and/or Complexity of Data Reviewed Labs: ordered.   This patient is a 88 y.o. female who presents to the ED for concern of diarrhea for 1 to  2 weeks, recent medication change increased dose of Depakote, and dark tarry stools with concern for possible melena or upper GI bleed, this involves an extensive number of treatment options, and is a complaint that carries with it a high risk of complications and morbidity. The emergent differential diagnosis prior to evaluation includes, but is not limited to, upper GI bleed, lower GI bleed, functional versus infectious diarrhea, COVID, flu, medication reaction, colitis, diverticulitis, diverticulosis versus other.   This is not an exhaustive differential.   Past Medical History / Co-morbidities / Social History: dementia, diabetes, hyperlipidemia, hypertension, anemia, diverticulosis, advanced age   Additional history: Chart reviewed. Pertinent results include: Reviewed patient's outpatient cardiology, neurology visits recently as well as lab work, imaging from previous emergency department visits  Physical Exam: Physical exam performed. The pertinent findings include: Patient is pleasant, not endorsing any pain and has benign abdominal exam.  She does have some dark apparently melanotic stool in the rectal vault but no bright red blood, no internal or external thrombosed hemorrhoids noted.  No rectal fissure noted.  Patient is in no acute distress with overall stable vital signs, she has been mildly hypertensive with systolic max of 768.  She is not endorsing any chest pain, shortness of breath, double vision or other vision changes.  Lab Tests: I ordered, and personally interpreted labs.  The pertinent results include: urinalysis did not show any evidence of urinary tract infection, CBC overall unremarkable, very mild thrombocytopenia, platelets  125, her Hemoccult is negative, and CMP without any electrolyte abnormalities    Disposition: After consideration of the diagnostic results and the patients response to treatment, I feel that Depakote has had reported a side effect of diarrhea, I have not seen any reports of Depakote causing dark in stools.  Patient does not endorse any Pepto-Bismol, iron supplementation or other iatrogenic causes of darkened stool.  She does not have any evidence of rectal bleeding, melena, and her work-up today is otherwise unremarkable with no abdominal pain and patient in no acute distress I think that she is stable for discharge with close PCP follow-up regarding her diarrhea and increased dark consistency of stool.  They may consider decreasing her Depakote, or referring for further investigation with GI if her diarrhea and dark stool continues.  Discussed extensive return precautions.  emergency department workup does not suggest an emergent condition requiring admission or immediate intervention beyond what has been performed at this time. The plan is: as above. The patient is safe for discharge and has been instructed to return immediately for worsening symptoms, change in symptoms or any other concerns.  I discussed this case with my attending physician Dr. Langston Masker who cosigned this note including patient's presenting symptoms, physical exam, and planned diagnostics and interventions. Attending physician stated agreement with plan or made changes to plan which were implemented.    Final Clinical Impression(s) / ED Diagnoses Final diagnoses:  Diarrhea, unspecified type  Dark stools    Rx / DC Orders ED Discharge Orders     None         Anselmo Pickler, PA-C 12/20/21 1703    Malvin Johns, MD 12/21/21 1459

## 2021-12-20 NOTE — Discharge Instructions (Addendum)
Her evaluation, lab work today was reassuring, she has no evidence of urinary tract infection, blood in her stool, or electrolyte abnormalities from her diarrhea.  Diarrhea has been reported side effect of Depakote, although it has not been reported to have changes in the color of stool, as she is overall well-appearing today and I would not recommend any further work-up in the emergency department, I do recommend that you follow-up with her primary care doctor to ensure that her symptoms are improving, and discuss adjustment of her Depakote medication.  Time you are welcome to Imodium as prescribed to help with excessive stool production.  I continue to encourage plenty of hydration, please return if she begins having severe abdominal pain, feeling dizzy, lightheaded, or you notice bright red blood in stool.

## 2022-01-08 ENCOUNTER — Ambulatory Visit: Admitting: Neurology

## 2022-01-08 ENCOUNTER — Encounter: Payer: Self-pay | Admitting: Family Medicine

## 2022-01-08 ENCOUNTER — Non-Acute Institutional Stay: Payer: Medicare Other | Admitting: Family Medicine

## 2022-01-08 VITALS — BP 140/84 | HR 71 | Resp 18

## 2022-01-08 DIAGNOSIS — R410 Disorientation, unspecified: Secondary | ICD-10-CM | POA: Diagnosis not present

## 2022-01-08 DIAGNOSIS — D696 Thrombocytopenia, unspecified: Secondary | ICD-10-CM | POA: Diagnosis not present

## 2022-01-08 DIAGNOSIS — Z515 Encounter for palliative care: Secondary | ICD-10-CM

## 2022-01-08 DIAGNOSIS — F01B3 Vascular dementia, moderate, with mood disturbance: Secondary | ICD-10-CM | POA: Diagnosis not present

## 2022-01-08 DIAGNOSIS — R350 Frequency of micturition: Secondary | ICD-10-CM | POA: Diagnosis not present

## 2022-01-08 DIAGNOSIS — F0390 Unspecified dementia without behavioral disturbance: Secondary | ICD-10-CM

## 2022-01-08 HISTORY — DX: Disorientation, unspecified: R41.0

## 2022-01-08 HISTORY — DX: Unspecified dementia, unspecified severity, without behavioral disturbance, psychotic disturbance, mood disturbance, and anxiety: F03.90

## 2022-01-08 NOTE — Progress Notes (Signed)
[facility] Palliative Care Consult Note Telephone: [phone]  Fax: [phone]   Date of encounter: 01/08/22 4:25 PM PATIENT NAME: [name] [address] [place] [address]   [phone] (home)  DOB: 03/11/1931 MRN: [id number] PRIMARY CARE PROVIDER:    Binnie Rail, MD,  [place] [address] [phone]  REFERRING PROVIDER:   Binnie Rail, MD [address] [place],  [place] [address] [phone]  RESPONSIBLE PARTY:    Contact Information     Name Relation Home Work [place] Daughter   [phone]   Susan, Flynn   [phone]        I met face to face with patient and daughter [name] in patient's independent living facility. Palliative Care was asked to follow this patient by consultation request of  Burns, Susan Lick, MD to address advance care planning and complex medical decision making. This is a follow up visit.          ASSESSMENT, SYMPTOM MANAGEMENT AND PLAN / RECOMMENDATIONS:   Moderate Mixed Vascular and Neurodegenerative Dementia with mood disturbance (agitation and anxiety)/Delirium with Dementia FAST 7 score 6b Continues on Aricept 10 mg Buspar 15 mg BID, Cymbalta 60 mg daily. Continue Depakote 375 mg BID. Recommend VPA level, ammonia and recheck PLT.  2.  Thrombocytopenia Recent WBC normal.  PLT mildly low at 125. Recommend repeat CBC with differential. Monitor for s/sx of bleeding  3.  Urinary frequency Give Cefdinir 300 mg po BID x 5 days for possible UTI    Follow up Palliative Care Visit: Palliative care will continue to follow for complex medical decision making, advance care planning, and clarification of goals. Return 4 weeks or prn.    This visit was coded based on medical decision making (MDM).  PPS: 60%  HOSPICE ELIGIBILITY/DIAGNOSIS: TBD  Chief Complaint:  Palliative Care is following for medical management  of chronic disease in setting of dementia with c/o increased confusion and dysuria.  HISTORY OF PRESENT ILLNESS:  [name] is a 88 y.o. year old female with dementia, HTN, diabetes with peripheral vascular disease and neuropathy, CAD, aortic atherosclerosis, GERD, tubulovillous adenoma of the colon, degenerative arthritis of lumbar spine with cord compression, OA of left shoulder, sciatica, HLD, anemia, gout, depression, RUQ pain and lumbar canal stenosis. Daughter states she didn't sleep well last night.  She states pt has been c/o burning on urination and has been much more confused the last couple of days. Pt is usually very pleasant and jovial during visit, cooperative.  Today she appears angry and when asked about checking her BP she says, "If you feel like you have to, go ahead!" Pt denies pain, SOB, nausea or vomiting.  Daughter [name] indicates that she has looked at increasing level of care to memory care and may be considering [facility] and/or memory care at [facility]. Will see if antibiotics will improve confusion and symptoms. Neurologist, Susan [name], indicated on August visit that pt was having increased confusion, agitation and sundowning, particularly starting around 4 pm.  Pt thinks she can give her own medicines and fights the ALF staff giving it to her. Neurologist indicates likely mixed vascular and Alzheimer's Dementia and can increase Depakote to 250 mg TID or 375 mg BID with follow up in 6 months.     History obtained from review of EMR, discussion with daughter who is [name] POA and a psychiatrist and/or  [name].   I reviewed EMR and there are no new available labs, imaging studies since last visit.  Reviewed Neurologist note as per above.  ROS obtained from pt and daughter General: NAD Cardiovascular: denies chest pain, denies DOE Pulmonary: denies cough, denies increased SOB Abdomen: endorses good appetite, denies constipation, endorses continence of  bowel GU: denies dysuria, endorses continence of urine with occasional urge incontinence MSK:  denies increased weakness, no fall reported Neurological: some intermittent back pain, denies insomnia Psych: Pt reports mood is good. Daughter endorses sundowning with increased agitation and paranoia particularly around 4 pm   Physical Exam: Current and past weights: weight 133 lbs as of 12/20/21, prior weight 138 lbs 6.4 ounces as of 09/05/21 Constitutional: NAD General: WN, WD CV: S1S2, RRR, no LE edema Pulmonary: CTAB, no increased work of breathing, no cough, room air Abdomen: normo-active BS + 4 quadrants, soft and non tender MSK: moves all extremities, ambulatory with cane or walker Neuro:  no generalized weakness, forgets short term details Psych: non-anxious affect, A and O x 3, intermittently irritable   CURRENT PROBLEM LIST:  Patient Active Problem List   Diagnosis Date Noted   Palliative care encounter 09/21/2021   Urinary frequency 09/18/2020   Aortic atherosclerosis ([place]) 08/07/2020   Moderate mixed vascular and neurodegenerative dementia with mood disturbance ([place]) 07/27/2017   Chronic RUQ pain 05/28/2016   Right knee pain 06/14/2015   Diabetic neuropathy ([place]) 03/21/2014   Bunion 03/21/2014   PVD (pulmonary valve disease) 01/23/2014   Onychomycosis 12/20/2013   PVD (peripheral vascular disease) ([place]) 12/19/2013   Gout 12/13/2013   Depression    Lumbar canal stenosis 05/26/2013   Neuralgia neuritis, sciatic nerve 05/26/2013   Degenerative arthritis of lumbar spine with cord compression 04/24/2013   Arthritis of shoulder region, left 09/11/2011   Type II diabetes mellitus with neurological manifestations ([place]) 01/17/2009   Anemia 10/15/2008   CAD, NATIVE VESSEL 06/09/2008   Hyperlipidemia 06/15/2007   Essential hypertension 06/15/2007   GERD 06/15/2007   TUBULOVILLOUS ADENOMA, 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   DIVERTICULOSIS, 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   PAST MEDICAL HISTORY:  Active  Ambulatory Problems    Diagnosis Date Noted   TUBULOVILLOUS ADENOMA, 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   Type II diabetes mellitus with neurological manifestations ([place]) 01/17/2009   Hyperlipidemia 06/15/2007   Anemia 10/15/2008   Essential hypertension 06/15/2007   CAD, NATIVE VESSEL 06/09/2008   GERD 06/15/2007   DIVERTICULOSIS, 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   Arthritis of shoulder region, left 09/11/2011   Degenerative arthritis of lumbar spine with cord compression 04/24/2013   Depression    Gout 12/13/2013   PVD (peripheral vascular disease) ([place]) 12/19/2013   Onychomycosis 12/20/2013   PVD (pulmonary valve disease) 01/23/2014   Diabetic neuropathy ([place]) 03/21/2014   Bunion 03/21/2014   Right knee pain 06/14/2015   Chronic RUQ pain 05/28/2016   Moderate mixed vascular and neurodegenerative dementia with mood disturbance ([place]) 07/27/2017   Aortic atherosclerosis ([place]) 08/07/2020   Urinary frequency 09/18/2020   Lumbar canal stenosis 05/26/2013   Neuralgia neuritis, sciatic nerve 05/26/2013   Palliative care encounter 09/21/2021   Resolved Ambulatory Problems    Diagnosis Date Noted   Dermatophytosis of nail 02/20/2009   HEMORRHOIDS, INTERNAL 03/02/2007   GASTRITIS 03/12/2009   HIATAL HERNIA 10/29/2000   UNSPECIFIED HEMORRHAGE OF GASTROINTESTINAL TRACT 12/21/2008   DRY SKIN 02/20/2009   ARTHRITIS 06/15/2007   Enthesopathy of hip region 11/28/2009   Fibromyalgia syndrome 06/15/2007   INSOMNIA-SLEEP DISORDER-UNSPEC 09/18/2009   Headache(784.0) 04/08/2009   PALPITATIONS,  RECURRENT 04/15/2009   Wheezing 09/18/2009   FECAL OCCULT BLOOD 01/16/2009   COLONIC POLYPS, HX OF 09/18/2008   Memory loss 07/23/2010   Palpitations 07/06/2012   Sciatica 09/05/2012   Sacroiliac joint dysfunction of right side 03/22/2013   Non-compliant behavior 12/05/2013   Radiculopathy of lumbar region 01/31/2014   HAV (hallux abducto valgus) 03/21/2014   Tenosynovitis of ankle 03/21/2014   Pronation deformity of ankle,  acquired 03/21/2014   Chest pain 04/17/2014   Pain in lower limb 07/18/2014   Non-compliant behavior 11/23/2014   Acute bronchitis 12/03/2014   Cough 05/02/2015   Abdominal pain, epigastric 09/02/2015   AP (abdominal pain) 09/02/2015   Mild cognitive impairment 12/12/2015   Buttock pain 10/30/2016   Abscess of right foot 12/02/2017   Cellulitis of foot 12/05/2017   Benign hypertension 01/04/2014   Past Medical History:  Diagnosis Date   DIABETES MELLITUS, TYPE II    FIBROMYALGIA    Gout    HYPERLIPIDEMIA    HYPERTENSION    Insomnia    OA (osteoarthritis)    S/P CABG x 1    S/P inguinal hernia repair    UNSPECIFIED ANEMIA    SOCIAL HX:  Social History   Tobacco Use   Smoking status: Former    Types: Cigarettes    Quit date: 04/26/1970    Years since quitting: 51.7   Smokeless tobacco: Never  Substance Use Topics   Alcohol use: Not Currently    Alcohol/week: 1.0 standard drink of alcohol    Types: 1 Glasses of wine per week   FAMILY HX:  Family History  Problem Relation Age of Onset   Cancer Mother        Stomach   Stomach cancer Mother 56   Hypertension Mother    Heart disease Father 53       MI   Diabetes Father    Heart attack Father    Vascular Disease Sister    Stroke Sister    Dementia Sister    Diabetes Sister    Parkinsonism Sister    Stroke Sister    Heart disease Sister    Heart attack Brother    Stroke Daughter    Diabetes Daughter    Hypertension Daughter    Colon cancer Neg Hx        Preferred Pharmacy: ALLERGIES: No Known Allergies   PERTINENT MEDICATIONS:  Outpatient Encounter Medications as of 01/08/2022  Medication Sig   acetaminophen (TYLENOL) 500 MG tablet Take 1,000 mg by mouth 2 (two) times daily.    amLODipine (NORVASC) 5 MG tablet TAKE ONE TABLET BY MOUTH TWICE A DAY   aspirin EC 81 MG tablet Take 81 mg by mouth daily.   busPIRone (BUSPAR) 15 MG tablet Take 1 tablet (15 mg total) by mouth 2 (two) times daily.   carvedilol  (COREG) 6.25 MG tablet TAKE ONE TABLET BY MOUTH TWICE A DAY   cetirizine (ZYRTEC) 10 MG tablet TAKE ONE TABLET BY MOUTH DAILY (Patient taking differently: Take 10 mg by mouth at bedtime.)   Cholecalciferol (D3-1000) 25 MCG (1000 UT) capsule Take 1,000 Units by mouth daily.   diclofenac sodium (VOLTAREN) 1 % GEL Apply 2 g topically 4 (four) times daily as needed (pain).   divalproex (DEPAKOTE) 125 MG Susan tablet Take 3 tablets twice a day   donepezil (ARICEPT) 10 MG tablet TAKE ONE TABLET BY MOUTH DAILY   DULoxetine (CYMBALTA) 60 MG capsule Take 1 capsule (60 mg total) by mouth daily.  ferrous sulfate 324 MG TBEC Take 324 mg by mouth daily.   gabapentin (NEURONTIN) 300 MG capsule TAKE ONE CAPSULE BY MOUTH EVERY NIGHT AT BEDTIME   MELATONIN PO Take 3 mg by mouth at bedtime as needed.   meloxicam (MOBIC) 7.5 MG tablet TAKE ONE TABLET BY MOUTH DAILY   memantine (NAMENDA) 10 MG tablet Take 1 tablet (10 mg total) by mouth 2 (two) times daily.   omeprazole (PRILOSEC) 40 MG capsule TAKE ONE CAPSULE BY MOUTH DAILY   QUEtiapine (SEROQUEL) 50 MG tablet Take 1 tablet (50 mg total) by mouth at bedtime.   ramipril (ALTACE) 10 MG capsule TAKE ONE CAPSULE BY MOUTH TWICE A DAY   No facility-administered encounter medications on file as of 01/08/2022.     CODE STATUS: Currently full code    Thank you for the opportunity to participate in the care of Ms. Cedar.  The palliative care team will continue to follow. Please call our office at [phone] if we can be of additional assistance.   Marijo Conception, FNP-C  COVID-19 PATIENT SCREENING TOOL Asked and negative response unless otherwise noted:  Have you had symptoms of covid, tested positive or been in contact with someone with symptoms/positive test in the past 5-10 days? No

## 2022-01-12 ENCOUNTER — Encounter: Payer: Self-pay | Admitting: Neurology

## 2022-01-13 ENCOUNTER — Telehealth: Payer: Self-pay | Admitting: Internal Medicine

## 2022-01-13 ENCOUNTER — Other Ambulatory Visit: Payer: Self-pay | Admitting: Family Medicine

## 2022-01-13 ENCOUNTER — Encounter: Payer: Self-pay | Admitting: Internal Medicine

## 2022-01-13 ENCOUNTER — Telehealth: Payer: Self-pay | Admitting: Family Medicine

## 2022-01-13 ENCOUNTER — Telehealth: Payer: Self-pay | Admitting: Neurology

## 2022-01-13 DIAGNOSIS — R410 Disorientation, unspecified: Secondary | ICD-10-CM

## 2022-01-13 DIAGNOSIS — Z5181 Encounter for therapeutic drug level monitoring: Secondary | ICD-10-CM | POA: Insufficient documentation

## 2022-01-13 DIAGNOSIS — Z111 Encounter for screening for respiratory tuberculosis: Secondary | ICD-10-CM

## 2022-01-13 DIAGNOSIS — Z117 Encounter for testing for latent tuberculosis infection: Secondary | ICD-10-CM | POA: Insufficient documentation

## 2022-01-13 DIAGNOSIS — D696 Thrombocytopenia, unspecified: Secondary | ICD-10-CM

## 2022-01-13 DIAGNOSIS — Z0279 Encounter for issue of other medical certificate: Secondary | ICD-10-CM

## 2022-01-13 DIAGNOSIS — F01B3 Vascular dementia, moderate, with mood disturbance: Secondary | ICD-10-CM | POA: Diagnosis not present

## 2022-01-13 HISTORY — DX: Encounter for testing for latent tuberculosis infection: Z11.7

## 2022-01-13 NOTE — Telephone Encounter (Signed)
Pts daughter arlene needs heather to call her to get a letter set up. Her and aquino has been talking back and forth in her mothers mychart. Her mother needs memory care placement due to her roaming. [phone]

## 2022-01-13 NOTE — Telephone Encounter (Signed)
Follow up of increased delirium superimposed on dementia as daughter left a message stating she needed some help with memory care placement. TCT  daughter Dr Guadlupe Spanish  who states pt was wandering and became agitated during the night.  She is not seeing significant improvement in acute delirium with antibiotic given for possible UTI. Pt had recent increase in Depakote dose per Neurologist to 375 mg BID.  Last PLT count 12/27/21 showed mild thrombocytopenia.  Acute worsening of confusion and agitation noted in the last 2 weeks.  Daughter is trying for memory care placement but needs TB gold test urgently.  Will send orders to [facility] on [facility] for am stat draw of VPA level, ammonia, CBC and TB gold.  Will have results called to myself and will share with PCP-Dr Burns.  Have advised Dr [name] Heck to hold am dose of Depakote and decrease tonight's dose back to 250 mg.  Damaris Hippo FNP-C

## 2022-01-13 NOTE — Telephone Encounter (Signed)
Dr [name] Heck called informed that we are faxing the [facility] for her mom today double checked fax number it is [phone]

## 2022-01-13 NOTE — Progress Notes (Signed)
A/P:  Delirium with Dementia (R41.0), Encounter for therapeutic drug level monitoring (Z51.81) On Depakote with recent upwards titration of dose to 375 mg BID. Obtain Depakote and ammonia level at [facility] 01/14/22 as stat. Have results faxed to [phone] [employer]. Hold am dose of Depakote, decreased tonight's dose to 250 mg.  2.   Thrombocytopenia (D69.6) Stat CBC with PLT and differential per [facility] Have results faxed to [phone] [facility]  3.  Encounter for testing for latent tuberculosis Has hx of + PPD skin test in past. Stat Quantiferon Gold TB plus Have results faxed to [phone] [facility]  S/O:  TCF pt's daughter, return call to daughter.  Pt had cough and dysuria when seen last week, started on Cefdinir 300 mg BID x 5 days.  She was demonstrating acute confusion and agitation during routine visit.  Pt had seen Neurologist approximately 4-5 weeks prior and dose of Depakote sprinkles had been increased from 250 mg BID to 375 mg BID.  Pt has previously had normal PLT count but had CBC done 12/20/21 which showed mildly low PLT 125 with normal WBC.  Daughter states she did not see any significant improvement in confusion with antibiotics and pt has had worsening behaviors such as wandering into someone else's room overnight and being argumentative.  Daughter is looking for emergent placement in memory care.  She states in the remote past, pt has had + PPD skin test and facility is requiring Quantiferon Plus TB Gold.    Daughter will take patient to [facility] 01/14/22 for stat labs as listed above.  Results will be faxed to [facility], atten Damaris Hippo FNP-C  Damaris Hippo FNP-C NPI [id number] [facility] [id number]

## 2022-01-13 NOTE — Telephone Encounter (Signed)
Patient's daughter states that she needs a TB test as soon as possible - Patient would like the blood test done.  An order needs to be placed in Epic - It can be done through [facility].  Please let daughter know - she is not sure if they have Epic or not.  It may have to be faxed to [facility]

## 2022-01-13 NOTE — Telephone Encounter (Signed)
Done, thanks

## 2022-01-14 DIAGNOSIS — D696 Thrombocytopenia, unspecified: Secondary | ICD-10-CM | POA: Diagnosis not present

## 2022-01-14 DIAGNOSIS — Z5181 Encounter for therapeutic drug level monitoring: Secondary | ICD-10-CM | POA: Diagnosis not present

## 2022-01-14 DIAGNOSIS — Z117 Encounter for testing for latent tuberculosis infection: Secondary | ICD-10-CM | POA: Diagnosis not present

## 2022-01-14 DIAGNOSIS — R410 Disorientation, unspecified: Secondary | ICD-10-CM | POA: Diagnosis not present

## 2022-01-14 NOTE — Telephone Encounter (Signed)
Yes--ordered

## 2022-01-22 ENCOUNTER — Encounter: Payer: Self-pay | Admitting: Neurology

## 2022-01-27 ENCOUNTER — Emergency Department (HOSPITAL_COMMUNITY)
Admission: EM | Admit: 2022-01-27 | Discharge: 2022-01-28 | Disposition: A | Discharge status: Skilled Nursing Facility | Payer: Medicare Other | Attending: Emergency Medicine | Admitting: Emergency Medicine

## 2022-01-27 ENCOUNTER — Encounter (HOSPITAL_COMMUNITY): Payer: Self-pay | Admitting: Emergency Medicine

## 2022-01-27 ENCOUNTER — Ambulatory Visit: Admitting: Neurology

## 2022-01-27 ENCOUNTER — Emergency Department (HOSPITAL_COMMUNITY): Payer: Medicare Other

## 2022-01-27 DIAGNOSIS — R410 Disorientation, unspecified: Secondary | ICD-10-CM | POA: Diagnosis not present

## 2022-01-27 DIAGNOSIS — F0390 Unspecified dementia without behavioral disturbance: Secondary | ICD-10-CM | POA: Diagnosis not present

## 2022-01-27 DIAGNOSIS — G8929 Other chronic pain: Secondary | ICD-10-CM | POA: Diagnosis not present

## 2022-01-27 DIAGNOSIS — R6889 Other general symptoms and signs: Secondary | ICD-10-CM | POA: Diagnosis not present

## 2022-01-27 DIAGNOSIS — M545 Low back pain, unspecified: Secondary | ICD-10-CM | POA: Diagnosis not present

## 2022-01-27 DIAGNOSIS — Z743 Need for continuous supervision: Secondary | ICD-10-CM | POA: Diagnosis not present

## 2022-01-27 DIAGNOSIS — Z7982 Long term (current) use of aspirin: Secondary | ICD-10-CM | POA: Diagnosis not present

## 2022-01-27 DIAGNOSIS — R262 Difficulty in walking, not elsewhere classified: Secondary | ICD-10-CM | POA: Diagnosis not present

## 2022-01-27 DIAGNOSIS — M549 Dorsalgia, unspecified: Secondary | ICD-10-CM | POA: Diagnosis not present

## 2022-01-27 DIAGNOSIS — M5442 Lumbago with sciatica, left side: Secondary | ICD-10-CM | POA: Diagnosis not present

## 2022-01-27 DIAGNOSIS — Z79899 Other long term (current) drug therapy: Secondary | ICD-10-CM | POA: Insufficient documentation

## 2022-01-27 LAB — CBC WITH DIFFERENTIAL/PLATELET
Abs Immature Granulocytes: 0.1 10*3/uL — ABNORMAL HIGH (ref 0.00–0.07)
Basophils Absolute: 0 10*3/uL (ref 0.0–0.1)
Basophils Relative: 0 %
Eosinophils Absolute: 0 10*3/uL (ref 0.0–0.5)
Eosinophils Relative: 0 %
HCT: 40.1 % (ref 36.0–46.0)
Hemoglobin: 13.7 g/dL (ref 12.0–15.0)
Immature Granulocytes: 1 %
Lymphocytes Relative: 15 %
Lymphs Abs: 1.9 10*3/uL (ref 0.7–4.0)
MCH: 32.5 pg (ref 26.0–34.0)
MCHC: 34.2 g/dL (ref 30.0–36.0)
MCV: 95.2 fL (ref 80.0–100.0)
Monocytes Absolute: 1.6 10*3/uL — ABNORMAL HIGH (ref 0.1–1.0)
Monocytes Relative: 12 %
Neutro Abs: 9.2 10*3/uL — ABNORMAL HIGH (ref 1.7–7.7)
Neutrophils Relative %: 72 %
Platelets: 152 10*3/uL (ref 150–400)
RBC: 4.21 MIL/uL (ref 3.87–5.11)
RDW: 13.1 % (ref 11.5–15.5)
WBC: 12.9 10*3/uL — ABNORMAL HIGH (ref 4.0–10.5)
nRBC: 0 % (ref 0.0–0.2)

## 2022-01-27 LAB — BASIC METABOLIC PANEL
Anion gap: 13 (ref 5–15)
BUN: 12 mg/dL (ref 8–23)
CO2: 28 mmol/L (ref 22–32)
Calcium: 8.9 mg/dL (ref 8.9–10.3)
Chloride: 97 mmol/L — ABNORMAL LOW (ref 98–111)
Creatinine, Ser: 0.8 mg/dL (ref 0.44–1.00)
GFR, Estimated: >60 mL/min (ref >60)
Glucose, Bld: 140 mg/dL — ABNORMAL HIGH (ref 70–99)
Potassium: 3.1 mmol/L — ABNORMAL LOW (ref 3.5–5.1)
Sodium: 138 mmol/L (ref 135–145)

## 2022-01-27 NOTE — ED Triage Notes (Signed)
Patient BIB [facility] from [facility] with complaint of back pain, facility reports she is normally able to walk independently but has been unable due to pain since yesterday. Patient is disoriented with dementia at baseline.

## 2022-01-27 NOTE — ED Provider Triage Note (Signed)
Emergency Medicine Provider Triage Evaluation Note  [name] , a 88 y.o. female  was evaluated in triage.  Pt complains of low back pain.  Per EMS, patient is typically able to ambulate independently however, has been unable to due to pain.  Patient unable to give history due to history of dementia.  Review of Systems  Positive: Back pain Negative:   Physical Exam  BP 124/63 (BP Location: Right Arm)   Pulse 68   Temp 98.3 F (36.8 C) (Oral)   Resp 14   SpO2 93%  Gen:   Awake, no distress   Resp:  Normal effort  MSK:   Moves extremities without difficulty  Other:  TTP throughout L spine  Medical Decision Making  Medically screening exam initiated at 6:23 PM.  Appropriate orders placed.  [name] was informed that the remainder of the evaluation will be completed by another provider, this initial triage assessment does not replace that evaluation, and the importance of remaining in the ED until their evaluation is complete.  CT scan Labs UA   [name], [place] 01/27/22 1824

## 2022-01-28 ENCOUNTER — Emergency Department (HOSPITAL_COMMUNITY): Payer: Medicare Other

## 2022-01-28 DIAGNOSIS — G8929 Other chronic pain: Secondary | ICD-10-CM | POA: Diagnosis present

## 2022-01-28 DIAGNOSIS — T50905A Adverse effect of unspecified drugs, medicaments and biological substances, initial encounter: Secondary | ICD-10-CM | POA: Diagnosis not present

## 2022-01-28 DIAGNOSIS — E119 Type 2 diabetes mellitus without complications: Secondary | ICD-10-CM | POA: Diagnosis not present

## 2022-01-28 DIAGNOSIS — Z743 Need for continuous supervision: Secondary | ICD-10-CM | POA: Diagnosis not present

## 2022-01-28 DIAGNOSIS — F01B3 Vascular dementia, moderate, with mood disturbance: Secondary | ICD-10-CM | POA: Diagnosis not present

## 2022-01-28 DIAGNOSIS — Z1152 Encounter for screening for COVID-19: Secondary | ICD-10-CM | POA: Diagnosis not present

## 2022-01-28 DIAGNOSIS — R6889 Other general symptoms and signs: Secondary | ICD-10-CM | POA: Diagnosis not present

## 2022-01-28 DIAGNOSIS — G9341 Metabolic encephalopathy: Secondary | ICD-10-CM | POA: Diagnosis not present

## 2022-01-28 DIAGNOSIS — M797 Fibromyalgia: Secondary | ICD-10-CM | POA: Diagnosis present

## 2022-01-28 DIAGNOSIS — F03918 Unspecified dementia, unspecified severity, with other behavioral disturbance: Secondary | ICD-10-CM | POA: Diagnosis not present

## 2022-01-28 DIAGNOSIS — Z800 Family history of malignant neoplasm of digestive organs: Secondary | ICD-10-CM | POA: Diagnosis not present

## 2022-01-28 DIAGNOSIS — K219 Gastro-esophageal reflux disease without esophagitis: Secondary | ICD-10-CM | POA: Diagnosis present

## 2022-01-28 DIAGNOSIS — G4700 Insomnia, unspecified: Secondary | ICD-10-CM | POA: Diagnosis present

## 2022-01-28 DIAGNOSIS — M6281 Muscle weakness (generalized): Secondary | ICD-10-CM | POA: Diagnosis not present

## 2022-01-28 DIAGNOSIS — E876 Hypokalemia: Secondary | ICD-10-CM | POA: Diagnosis not present

## 2022-01-28 DIAGNOSIS — F0393 Unspecified dementia, unspecified severity, with mood disturbance: Secondary | ICD-10-CM | POA: Diagnosis not present

## 2022-01-28 DIAGNOSIS — F0390 Unspecified dementia without behavioral disturbance: Secondary | ICD-10-CM | POA: Diagnosis not present

## 2022-01-28 DIAGNOSIS — J9601 Acute respiratory failure with hypoxia: Secondary | ICD-10-CM | POA: Diagnosis not present

## 2022-01-28 DIAGNOSIS — R0902 Hypoxemia: Secondary | ICD-10-CM | POA: Diagnosis not present

## 2022-01-28 DIAGNOSIS — R0602 Shortness of breath: Secondary | ICD-10-CM | POA: Diagnosis not present

## 2022-01-28 DIAGNOSIS — R41841 Cognitive communication deficit: Secondary | ICD-10-CM | POA: Diagnosis not present

## 2022-01-28 DIAGNOSIS — I10 Essential (primary) hypertension: Secondary | ICD-10-CM | POA: Diagnosis not present

## 2022-01-28 DIAGNOSIS — I2510 Atherosclerotic heart disease of native coronary artery without angina pectoris: Secondary | ICD-10-CM | POA: Diagnosis present

## 2022-01-28 DIAGNOSIS — R2689 Other abnormalities of gait and mobility: Secondary | ICD-10-CM | POA: Diagnosis not present

## 2022-01-28 DIAGNOSIS — E860 Dehydration: Secondary | ICD-10-CM | POA: Diagnosis not present

## 2022-01-28 DIAGNOSIS — I491 Atrial premature depolarization: Secondary | ICD-10-CM | POA: Diagnosis not present

## 2022-01-28 DIAGNOSIS — N3000 Acute cystitis without hematuria: Secondary | ICD-10-CM | POA: Diagnosis not present

## 2022-01-28 DIAGNOSIS — I499 Cardiac arrhythmia, unspecified: Secondary | ICD-10-CM | POA: Diagnosis not present

## 2022-01-28 DIAGNOSIS — E785 Hyperlipidemia, unspecified: Secondary | ICD-10-CM | POA: Diagnosis not present

## 2022-01-28 DIAGNOSIS — M19031 Primary osteoarthritis, right wrist: Secondary | ICD-10-CM | POA: Diagnosis not present

## 2022-01-28 DIAGNOSIS — F32A Depression, unspecified: Secondary | ICD-10-CM | POA: Diagnosis not present

## 2022-01-28 DIAGNOSIS — Z951 Presence of aortocoronary bypass graft: Secondary | ICD-10-CM | POA: Diagnosis not present

## 2022-01-28 DIAGNOSIS — N390 Urinary tract infection, site not specified: Secondary | ICD-10-CM | POA: Diagnosis not present

## 2022-01-28 DIAGNOSIS — M159 Polyosteoarthritis, unspecified: Secondary | ICD-10-CM | POA: Diagnosis not present

## 2022-01-28 DIAGNOSIS — N179 Acute kidney failure, unspecified: Secondary | ICD-10-CM | POA: Diagnosis not present

## 2022-01-28 DIAGNOSIS — M109 Gout, unspecified: Secondary | ICD-10-CM | POA: Diagnosis present

## 2022-01-28 DIAGNOSIS — R278 Other lack of coordination: Secondary | ICD-10-CM | POA: Diagnosis not present

## 2022-01-28 DIAGNOSIS — R410 Disorientation, unspecified: Secondary | ICD-10-CM | POA: Diagnosis not present

## 2022-01-28 LAB — URINALYSIS, ROUTINE W REFLEX MICROSCOPIC
Bilirubin Urine: NEGATIVE
Glucose, UA: NEGATIVE mg/dL
Ketones, ur: 20 mg/dL — AB
Leukocytes,Ua: NEGATIVE
Nitrite: NEGATIVE
Protein, ur: 100 mg/dL — AB
Specific Gravity, Urine: 1.019 (ref 1.005–1.030)
pH: 6 (ref 5.0–8.0)

## 2022-01-28 MED ORDER — PANTOPRAZOLE SODIUM 40 MG PO TBEC
40.0000 mg | DELAYED_RELEASE_TABLET | Freq: Every day | ORAL | Status: DC
  Administered 2022-01-28: 40 mg via ORAL
  Filled 2022-01-28: qty 1

## 2022-01-28 MED ORDER — ACETAMINOPHEN 325 MG PO TABS
650.0000 mg | ORAL_TABLET | Freq: Once | ORAL | Status: AC
  Administered 2022-01-28: 650 mg via ORAL
  Filled 2022-01-28: qty 2

## 2022-01-28 MED ORDER — GABAPENTIN 300 MG PO CAPS
300.0000 mg | ORAL_CAPSULE | Freq: Once | ORAL | Status: AC
  Administered 2022-01-28: 300 mg via ORAL
  Filled 2022-01-28: qty 1

## 2022-01-28 MED ORDER — RAMIPRIL 5 MG PO CAPS
10.0000 mg | ORAL_CAPSULE | Freq: Every day | ORAL | Status: DC
  Administered 2022-01-28: 10 mg via ORAL
  Filled 2022-01-28: qty 2

## 2022-01-28 MED ORDER — AMLODIPINE BESYLATE 5 MG PO TABS
5.0000 mg | ORAL_TABLET | Freq: Once | ORAL | Status: AC
  Administered 2022-01-28: 5 mg via ORAL
  Filled 2022-01-28: qty 1

## 2022-01-28 MED ORDER — MELOXICAM 7.5 MG PO TABS
7.5000 mg | ORAL_TABLET | Freq: Every day | ORAL | Status: DC
  Administered 2022-01-28: 7.5 mg via ORAL
  Filled 2022-01-28: qty 1

## 2022-01-28 MED ORDER — BUSPIRONE HCL 10 MG PO TABS: 15.0000 mg | ORAL_TABLET | Freq: Once | ORAL | Status: DC

## 2022-01-28 MED ORDER — CARVEDILOL 3.125 MG PO TABS
6.2500 mg | ORAL_TABLET | Freq: Once | ORAL | Status: AC
  Administered 2022-01-28: 6.25 mg via ORAL
  Filled 2022-01-28: qty 2

## 2022-01-28 NOTE — Discharge Instructions (Addendum)
Patient was evaluated in the [facility] department for back pain. Her daughter came to the ED and states that she has chronic back pain at baseline. A CT scan of her lumbar spine was completed and demonstrates no acute process. She has underlying degenerative changes that are likely causing today's symptoms. Please continue her home doses of tylenol and mobic as needed for pain. Her urine was check and demonstrates no urinary tract infection.   She is pleasantly demented but

## 2022-01-28 NOTE — ED Provider Notes (Signed)
[facility] EMERGENCY DEPARTMENT Provider Note   CSN: [id number] Arrival date & time: 01/27/22  1754     History  Chief Complaint  Patient presents with   Back Pain    [name] is a 88 y.o. female.  Patient is an 88 year old female past medical history of dementia presenting with her daughter for back pain.  The patient's daughter states that she recently got placed in a nursing home and they do not know her well yet.  States she was sent to the emergency department prior to her being called. Patient admits to chronic history of lower back pain that has been recently worsening over the past several days.  Patient denies any dysuria, increased frequency, urgency.  Denies any sensation or motor dysfunction in the lower extremities.  The history is provided by the patient. No language interpreter was used.  Back Pain Associated symptoms: no abdominal pain, no chest pain, no dysuria and no fever        Home Medications Prior to Admission medications   Medication Sig Start Date End Date Taking? Authorizing Provider  acetaminophen (TYLENOL) 500 MG tablet Take 1,000 mg by mouth 2 (two) times daily.     [provider]  amLODipine (NORVASC) 5 MG tablet TAKE ONE TABLET BY MOUTH TWICE A DAY 09/02/21   Binnie Rail, MD  aspirin EC 81 MG tablet Take 81 mg by mouth daily.    [provider]  busPIRone (BUSPAR) 15 MG tablet Take 1 tablet (15 mg total) by mouth 2 (two) times daily. 12/12/21   Cameron Sprang, MD  carvedilol (COREG) 6.25 MG tablet TAKE ONE TABLET BY MOUTH TWICE A DAY 09/19/21   Fay Records, MD  cephALEXin (KEFLEX) 500 MG capsule Take 1 capsule (500 mg total) by mouth every 12 (twelve) hours. 02/04/22   Shalhoub, Sherryll Burger, MD  cetirizine (ZYRTEC) 10 MG tablet TAKE ONE TABLET BY MOUTH DAILY Patient taking differently: Take 10 mg by mouth at bedtime. 02/22/17   Binnie Rail, MD  Cholecalciferol (D3-1000) 25 MCG (1000 UT) capsule Take  1,000 Units by mouth daily.    [provider]  divalproex (DEPAKOTE) 125 MG DR tablet Take 1 tablet (125 mg total) by mouth 2 (two) times daily. Please administer this course of Depakote second 02/14/22   Vernelle Emerald, MD  divalproex (DEPAKOTE) 125 MG DR tablet Take 1 tablet (125 mg total) by mouth at bedtime. Please administer this course of Depakote third 02/28/22   Vernelle Emerald, MD  divalproex (DEPAKOTE) 250 MG DR tablet Take 1 tablet (250 mg total) by mouth 2 (two) times daily. Please administer this course of Depakote first 02/04/22   Vernelle Emerald, MD  donepezil (ARICEPT) 10 MG tablet TAKE ONE TABLET BY MOUTH DAILY 12/12/21   Cameron Sprang, MD  ferrous sulfate 324 MG TBEC Take 324 mg by mouth daily.    [provider]  gabapentin (NEURONTIN) 300 MG capsule TAKE ONE CAPSULE BY MOUTH EVERY NIGHT AT BEDTIME 09/02/21   Burns, Claudina Lick, MD  MELATONIN PO Take 3 mg by mouth at bedtime.    [provider]  meloxicam (MOBIC) 7.5 MG tablet TAKE ONE TABLET BY MOUTH DAILY 12/26/20   Binnie Rail, MD  memantine (NAMENDA) 10 MG tablet Take 1 tablet (10 mg total) by mouth 2 (two) times daily. 12/12/21   Cameron Sprang, MD  omeprazole (PRILOSEC) 40 MG capsule TAKE ONE CAPSULE BY MOUTH  DAILY 08/19/21   Binnie Rail, MD  QUEtiapine (SEROQUEL) 50 MG tablet Take 1 tablet (50 mg total) by mouth at bedtime. 12/12/21   Cameron Sprang, MD      Allergies    Patient has no known allergies.    Review of Systems   Review of Systems  Constitutional:  Negative for chills and fever.  HENT:  Negative for ear pain and sore throat.   Eyes:  Negative for pain and visual disturbance.  Respiratory:  Negative for cough and shortness of breath.   Cardiovascular:  Negative for chest pain and palpitations.  Gastrointestinal:  Negative for abdominal pain and vomiting.  Genitourinary:  Negative for dysuria and hematuria.  Musculoskeletal:  Positive for back pain. Negative for  arthralgias.  Skin:  Negative for color change and rash.  Neurological:  Negative for seizures and syncope.  All other systems reviewed and are negative.   Physical Exam Updated Vital Signs BP (!) 148/72 (BP Location: Right Arm)   Pulse 78   Temp 98.2 F (36.8 C) (Oral)   Resp 16   SpO2 93%  Physical Exam Vitals and nursing note reviewed.  Constitutional:      General: She is not in acute distress.    Appearance: She is well-developed.  HENT:     Head: Normocephalic and atraumatic.  Eyes:     Conjunctiva/sclera: Conjunctivae normal.  Cardiovascular:     Rate and Rhythm: Normal rate and regular rhythm.     Pulses:          Dorsalis pedis pulses are 2+ on the right side and 2+ on the left side.     Heart sounds: No murmur heard. Pulmonary:     Effort: Pulmonary effort is normal. No respiratory distress.     Breath sounds: Normal breath sounds.  Abdominal:     Palpations: Abdomen is soft.     Tenderness: There is no abdominal tenderness.  Musculoskeletal:        General: No swelling.     Cervical back: Neck supple. No bony tenderness.     Thoracic back: No bony tenderness.     Lumbar back: Tenderness present. No bony tenderness.  Skin:    General: Skin is warm and dry.     Capillary Refill: Capillary refill takes less than 2 seconds.  Neurological:     Mental Status: She is alert and oriented to person, place, and time. She is confused.     GCS: GCS eye subscore is 4. GCS verbal subscore is 5. GCS motor subscore is 6.     Sensory: Sensation is intact.     Motor: Motor function is intact.  Psychiatric:        Mood and Affect: Mood normal.     ED Results / Procedures / Treatments   Labs (all labs ordered are listed, but only abnormal results are displayed) Labs Reviewed  CBC WITH DIFFERENTIAL/PLATELET - Abnormal; Notable for the following components:      Result Value   WBC 12.9 (*)    Neutro Abs 9.2 (*)    Monocytes Absolute 1.6 (*)    Abs Immature  Granulocytes 0.10 (*)    All other components within normal limits  BASIC METABOLIC PANEL - Abnormal; Notable for the following components:   Potassium 3.1 (*)    Chloride 97 (*)    Glucose, Bld 140 (*)    All other components within normal limits  URINALYSIS, ROUTINE W REFLEX MICROSCOPIC - Abnormal; Notable  for the following components:   Hgb urine dipstick SMALL (*)    Ketones, ur 20 (*)    Protein, ur 100 (*)    Bacteria, UA RARE (*)    All other components within normal limits    EKG None  Radiology No results found.  Procedures Procedures    Medications Ordered in ED Medications  acetaminophen (TYLENOL) tablet 650 mg (650 mg Oral Given 01/28/22 0858)  gabapentin (NEURONTIN) capsule 300 mg (300 mg Oral Given 01/28/22 0858)  amLODipine (NORVASC) tablet 5 mg (5 mg Oral Given 01/28/22 1131)  carvedilol (COREG) tablet 6.25 mg (6.25 mg Oral Given 01/28/22 1131)    ED Course/ Medical Decision Making/ A&P                           Medical Decision Making Amount and/or Complexity of Data Reviewed Labs: ordered. Radiology: ordered.  Risk OTC drugs. Prescription drug management.   31:7 PM 88 year old female past medical history of dementia presenting with her daughter for back pain.  Patient is alert and oriented but confused and asked questions repetitively in nature.  Patient's daughter states that this is her current baseline.  She does have some lumbar paraspinal tenderness to both sides.  No midline spinal pain.  CT lumbar spine demonstrates multilevel degenerative changes but no acute lumbar fractures.  Patient does have moderate spinal stenosis.  These findings were discussed with the patient's daughter who states these are chronic findings. Urinalysis demonstrates no urinary tract infection.  No signs of cauda equina syndrome.  Lower extremities are neurovascularly intact.  Does have difficulty ambulating.  Daughter states this is her baseline.  Patient has no other  signs or symptoms of sepsis.   Patient also does report a fall in the past week.  Tenderness to palpation of the right hand and wrist.  X-rays demonstrate no acute fractures or dislocations.  She does have severe osteoarthritic patterns.  At this time she will be discharged back to her nursing facility.  Patient in no distress and overall condition improved here in the ED. Detailed discussions were had with the patient regarding current findings, and need for close f/u with PCP or on call doctor. The patient has been instructed to return immediately if the symptoms worsen in any way for re-evaluation. Patient verbalized understanding and is in agreement with current care plan. All questions answered prior to discharge.        Final Clinical Impression(s) / ED Diagnoses Final diagnoses:  Chronic left-sided low back pain with sciatica, sciatica laterality unspecified    Rx / DC Orders ED Discharge Orders     None         Lianne Cure, DO 98/92/11 1755

## 2022-01-28 NOTE — ED Notes (Addendum)
CHANGE PATIENT CLEAN HER UP .PLACE DRY DIAPER ON CHUX UNDER PATIENT.SHE IS IN THE RECLINER  COVERED UP WITH Curlene Dolphin

## 2022-01-30 ENCOUNTER — Other Ambulatory Visit: Payer: Self-pay

## 2022-01-30 ENCOUNTER — Inpatient Hospital Stay (HOSPITAL_COMMUNITY)
Admission: EM | Admit: 2022-01-30 | Discharge: 2022-02-04 | DRG: 682 | Disposition: A | Discharge status: Home / Self Care | Payer: Medicare Other | Source: Skilled Nursing Facility | Attending: Internal Medicine | Admitting: Internal Medicine

## 2022-01-30 ENCOUNTER — Non-Acute Institutional Stay: Payer: Medicare Other | Admitting: Family Medicine

## 2022-01-30 ENCOUNTER — Encounter: Payer: Self-pay | Admitting: Family Medicine

## 2022-01-30 ENCOUNTER — Emergency Department (HOSPITAL_COMMUNITY): Payer: Medicare Other

## 2022-01-30 ENCOUNTER — Encounter (HOSPITAL_COMMUNITY): Payer: Self-pay | Admitting: *Deleted

## 2022-01-30 VITALS — BP 132/80 | HR 71 | Resp 20

## 2022-01-30 DIAGNOSIS — R0902 Hypoxemia: Secondary | ICD-10-CM | POA: Diagnosis not present

## 2022-01-30 DIAGNOSIS — E876 Hypokalemia: Secondary | ICD-10-CM

## 2022-01-30 DIAGNOSIS — N390 Urinary tract infection, site not specified: Secondary | ICD-10-CM

## 2022-01-30 DIAGNOSIS — Z951 Presence of aortocoronary bypass graft: Secondary | ICD-10-CM

## 2022-01-30 DIAGNOSIS — M109 Gout, unspecified: Secondary | ICD-10-CM | POA: Diagnosis present

## 2022-01-30 DIAGNOSIS — Z743 Need for continuous supervision: Secondary | ICD-10-CM | POA: Diagnosis not present

## 2022-01-30 DIAGNOSIS — Z833 Family history of diabetes mellitus: Secondary | ICD-10-CM

## 2022-01-30 DIAGNOSIS — T426X5A Adverse effect of other antiepileptic and sedative-hypnotic drugs, initial encounter: Secondary | ICD-10-CM

## 2022-01-30 DIAGNOSIS — Z800 Family history of malignant neoplasm of digestive organs: Secondary | ICD-10-CM

## 2022-01-30 DIAGNOSIS — Z1152 Encounter for screening for COVID-19: Secondary | ICD-10-CM

## 2022-01-30 DIAGNOSIS — N179 Acute kidney failure, unspecified: Principal | ICD-10-CM

## 2022-01-30 DIAGNOSIS — M19031 Primary osteoarthritis, right wrist: Secondary | ICD-10-CM | POA: Diagnosis present

## 2022-01-30 DIAGNOSIS — I499 Cardiac arrhythmia, unspecified: Secondary | ICD-10-CM | POA: Diagnosis not present

## 2022-01-30 DIAGNOSIS — F01B3 Vascular dementia, moderate, with mood disturbance: Secondary | ICD-10-CM

## 2022-01-30 DIAGNOSIS — F32A Depression, unspecified: Secondary | ICD-10-CM | POA: Diagnosis present

## 2022-01-30 DIAGNOSIS — M797 Fibromyalgia: Secondary | ICD-10-CM | POA: Diagnosis present

## 2022-01-30 DIAGNOSIS — G8929 Other chronic pain: Secondary | ICD-10-CM | POA: Diagnosis present

## 2022-01-30 DIAGNOSIS — E860 Dehydration: Secondary | ICD-10-CM

## 2022-01-30 DIAGNOSIS — E119 Type 2 diabetes mellitus without complications: Secondary | ICD-10-CM

## 2022-01-30 DIAGNOSIS — T50905A Adverse effect of unspecified drugs, medicaments and biological substances, initial encounter: Secondary | ICD-10-CM | POA: Insufficient documentation

## 2022-01-30 DIAGNOSIS — F0393 Unspecified dementia, unspecified severity, with mood disturbance: Secondary | ICD-10-CM | POA: Diagnosis present

## 2022-01-30 DIAGNOSIS — J9601 Acute respiratory failure with hypoxia: Secondary | ICD-10-CM | POA: Diagnosis not present

## 2022-01-30 DIAGNOSIS — I10 Essential (primary) hypertension: Secondary | ICD-10-CM | POA: Diagnosis present

## 2022-01-30 DIAGNOSIS — I2510 Atherosclerotic heart disease of native coronary artery without angina pectoris: Secondary | ICD-10-CM | POA: Diagnosis present

## 2022-01-30 DIAGNOSIS — R0602 Shortness of breath: Secondary | ICD-10-CM | POA: Diagnosis not present

## 2022-01-30 DIAGNOSIS — F03918 Unspecified dementia, unspecified severity, with other behavioral disturbance: Secondary | ICD-10-CM | POA: Diagnosis present

## 2022-01-30 DIAGNOSIS — Z8249 Family history of ischemic heart disease and other diseases of the circulatory system: Secondary | ICD-10-CM

## 2022-01-30 DIAGNOSIS — F0390 Unspecified dementia without behavioral disturbance: Secondary | ICD-10-CM

## 2022-01-30 DIAGNOSIS — N3000 Acute cystitis without hematuria: Secondary | ICD-10-CM

## 2022-01-30 DIAGNOSIS — G9341 Metabolic encephalopathy: Secondary | ICD-10-CM

## 2022-01-30 DIAGNOSIS — Z823 Family history of stroke: Secondary | ICD-10-CM

## 2022-01-30 DIAGNOSIS — E785 Hyperlipidemia, unspecified: Secondary | ICD-10-CM | POA: Diagnosis present

## 2022-01-30 DIAGNOSIS — Z791 Long term (current) use of non-steroidal anti-inflammatories (NSAID): Secondary | ICD-10-CM

## 2022-01-30 DIAGNOSIS — Z90710 Acquired absence of both cervix and uterus: Secondary | ICD-10-CM

## 2022-01-30 DIAGNOSIS — K219 Gastro-esophageal reflux disease without esophagitis: Secondary | ICD-10-CM | POA: Diagnosis present

## 2022-01-30 DIAGNOSIS — R410 Disorientation, unspecified: Secondary | ICD-10-CM | POA: Diagnosis not present

## 2022-01-30 DIAGNOSIS — Z79899 Other long term (current) drug therapy: Secondary | ICD-10-CM

## 2022-01-30 DIAGNOSIS — G4700 Insomnia, unspecified: Secondary | ICD-10-CM | POA: Diagnosis present

## 2022-01-30 DIAGNOSIS — Z7982 Long term (current) use of aspirin: Secondary | ICD-10-CM

## 2022-01-30 DIAGNOSIS — R6889 Other general symptoms and signs: Secondary | ICD-10-CM | POA: Diagnosis not present

## 2022-01-30 DIAGNOSIS — I491 Atrial premature depolarization: Secondary | ICD-10-CM | POA: Diagnosis not present

## 2022-01-30 DIAGNOSIS — M1990 Unspecified osteoarthritis, unspecified site: Secondary | ICD-10-CM

## 2022-01-30 HISTORY — DX: Dehydration: E86.0

## 2022-01-30 HISTORY — DX: Acute respiratory failure with hypoxia: J96.01

## 2022-01-30 LAB — MAGNESIUM: Magnesium: 2.2 mg/dL (ref 1.7–2.4)

## 2022-01-30 LAB — BRAIN NATRIURETIC PEPTIDE: B Natriuretic Peptide: 150.6 pg/mL — ABNORMAL HIGH (ref 0.0–100.0)

## 2022-01-30 LAB — BASIC METABOLIC PANEL
Anion gap: 12 (ref 5–15)
BUN: 47 mg/dL — ABNORMAL HIGH (ref 8–23)
CO2: 30 mmol/L (ref 22–32)
Calcium: 8.4 mg/dL — ABNORMAL LOW (ref 8.9–10.3)
Chloride: 97 mmol/L — ABNORMAL LOW (ref 98–111)
Creatinine, Ser: 1.59 mg/dL — ABNORMAL HIGH (ref 0.44–1.00)
GFR, Estimated: 31 mL/min — ABNORMAL LOW (ref >60)
Glucose, Bld: 125 mg/dL — ABNORMAL HIGH (ref 70–99)
Potassium: 3.2 mmol/L — ABNORMAL LOW (ref 3.5–5.1)
Sodium: 139 mmol/L (ref 135–145)

## 2022-01-30 LAB — URINALYSIS, ROUTINE W REFLEX MICROSCOPIC
Bilirubin Urine: NEGATIVE
Glucose, UA: NEGATIVE mg/dL
Hgb urine dipstick: NEGATIVE
Ketones, ur: 5 mg/dL — AB
Nitrite: NEGATIVE
Protein, ur: 30 mg/dL — AB
Specific Gravity, Urine: 1.018 (ref 1.005–1.030)
pH: 5 (ref 5.0–8.0)

## 2022-01-30 LAB — CBC WITH DIFFERENTIAL/PLATELET
Abs Immature Granulocytes: 0.05 10*3/uL (ref 0.00–0.07)
Basophils Absolute: 0 10*3/uL (ref 0.0–0.1)
Basophils Relative: 0 %
Eosinophils Absolute: 0.1 10*3/uL (ref 0.0–0.5)
Eosinophils Relative: 1 %
HCT: 38.2 % (ref 36.0–46.0)
Hemoglobin: 12.7 g/dL (ref 12.0–15.0)
Immature Granulocytes: 1 %
Lymphocytes Relative: 18 %
Lymphs Abs: 1.4 10*3/uL (ref 0.7–4.0)
MCH: 31.9 pg (ref 26.0–34.0)
MCHC: 33.2 g/dL (ref 30.0–36.0)
MCV: 96 fL (ref 80.0–100.0)
Monocytes Absolute: 0.8 10*3/uL (ref 0.1–1.0)
Monocytes Relative: 10 %
Neutro Abs: 5.3 10*3/uL (ref 1.7–7.7)
Neutrophils Relative %: 70 %
Platelets: 180 10*3/uL (ref 150–400)
RBC: 3.98 MIL/uL (ref 3.87–5.11)
RDW: 13.2 % (ref 11.5–15.5)
WBC: 7.6 10*3/uL (ref 4.0–10.5)
nRBC: 0 % (ref 0.0–0.2)

## 2022-01-30 LAB — VALPROIC ACID LEVEL: Valproic Acid Lvl: 33 ug/mL — ABNORMAL LOW (ref 50.0–100.0)

## 2022-01-30 LAB — SARS CORONAVIRUS 2 BY RT PCR: SARS Coronavirus 2 by RT PCR: NEGATIVE

## 2022-01-30 MED ORDER — QUETIAPINE FUMARATE 50 MG PO TABS
50.0000 mg | ORAL_TABLET | Freq: Once | ORAL | Status: AC
  Administered 2022-01-30: 50 mg via ORAL
  Filled 2022-01-30: qty 1

## 2022-01-30 MED ORDER — SODIUM CHLORIDE 0.9 % IV BOLUS
1000.0000 mL | Freq: Once | INTRAVENOUS | Status: AC
  Administered 2022-01-30: 1000 mL via INTRAVENOUS

## 2022-01-30 MED ORDER — POTASSIUM CHLORIDE CRYS ER 20 MEQ PO TBCR
40.0000 meq | EXTENDED_RELEASE_TABLET | Freq: Once | ORAL | Status: AC
  Administered 2022-01-30: 40 meq via ORAL
  Filled 2022-01-30: qty 2

## 2022-01-30 MED ORDER — CEPHALEXIN 500 MG PO CAPS: 500.0000 mg | ORAL_CAPSULE | Freq: Three times a day (TID) | ORAL | 0 refills | Status: DC

## 2022-01-30 MED ORDER — ALIGN 4 MG PO CAPS: 1.0000 | ORAL_CAPSULE | Freq: Every day | ORAL | 0 refills | Status: DC

## 2022-01-30 MED ORDER — GABAPENTIN 300 MG PO CAPS
300.0000 mg | ORAL_CAPSULE | Freq: Once | ORAL | Status: AC
  Administered 2022-01-30: 300 mg via ORAL
  Filled 2022-01-30: qty 1

## 2022-01-30 MED ORDER — CEPHALEXIN 500 MG PO CAPS
500.0000 mg | ORAL_CAPSULE | Freq: Once | ORAL | Status: AC
  Administered 2022-01-30: 500 mg via ORAL
  Filled 2022-01-30: qty 1

## 2022-01-30 NOTE — ED Triage Notes (Signed)
BIB [facility] for sob/hypoxia from [facility], see paperwork, h/o vasc dementia and alzheimers, SPO2 for facility reported as 80% RA, was 92-93% on RA with EMS, placed on 2L and increased to 96-98%. Pt is 97% on RA on arrival. Alert, confused at baseline, NAD, calm, resps e/u. CBG 126. NSL L FA, 20g. Sinus arrhythmia with frequent PVC 50s-70s on monitor. No h/o afib. Dr. Gilford Raid present on arrival.

## 2022-01-30 NOTE — ED Provider Notes (Addendum)
[place] DEPT Provider Note   CSN: [id number] Arrival date & time: 01/30/22  1839     History  Chief Complaint  Patient presents with   Shortness of Breath    [name] is a 88 y.o. female.  Pt is a 88 yo female with a pmhx significant for dementia, htn, depression, CAD s/p CABG, HLD, DM2, and fibromyalgia.  She presents to the ED today with low O2 sats.  EMS said they were told that she had an O2 sat of 80%.  When they arrived, pt's O2 sat was normal.  They did put her on 2L for transport.  They told us that her MS was baseline.  Her daughter who is a physician said she is much more confused than usual.  Pt was moved to a memory care unit on 10/5.  Pt was supposed to be getting a tapering dose of Depakote (daughter said that was causing some confusion and problems with her cbc).  However, the facility did not get an updated MAR and were giving pt her regular dose of Depakote.  Pt is unable to give any hx.  She denies any pain or sob.       Home Medications Prior to Admission medications   Medication Sig Start Date End Date Taking? Authorizing Provider  acetaminophen (TYLENOL) 500 MG tablet Take 1,000 mg by mouth 2 (two) times daily.     [provider]  amLODipine (NORVASC) 5 MG tablet TAKE ONE TABLET BY MOUTH TWICE A DAY 09/02/21   Binnie Rail, MD  aspirin EC 81 MG tablet Take 81 mg by mouth daily.    [provider]  busPIRone (BUSPAR) 15 MG tablet Take 1 tablet (15 mg total) by mouth 2 (two) times daily. 12/12/21   Cameron Sprang, MD  carvedilol (COREG) 6.25 MG tablet TAKE ONE TABLET BY MOUTH TWICE A DAY 09/19/21   Fay Records, MD  cetirizine (ZYRTEC) 10 MG tablet TAKE ONE TABLET BY MOUTH DAILY Patient taking differently: Take 10 mg by mouth at bedtime. 02/22/17   Binnie Rail, MD  Cholecalciferol (D3-1000) 25 MCG (1000 UT) capsule Take 1,000 Units by mouth daily.    [provider]  diclofenac sodium  (VOLTAREN) 1 % GEL Apply 2 g topically 4 (four) times daily as needed (pain). 08/25/18   Binnie Rail, MD  donepezil (ARICEPT) 10 MG tablet TAKE ONE TABLET BY MOUTH DAILY 12/12/21   Cameron Sprang, MD  DULoxetine (CYMBALTA) 60 MG capsule Take 1 capsule (60 mg total) by mouth daily. 12/12/21   Cameron Sprang, MD  ferrous sulfate 324 MG TBEC Take 324 mg by mouth daily.    [provider]  gabapentin (NEURONTIN) 300 MG capsule TAKE ONE CAPSULE BY MOUTH EVERY NIGHT AT BEDTIME 09/02/21   Burns, Claudina Lick, MD  MELATONIN PO Take 3 mg by mouth at bedtime as needed.    [provider]  meloxicam (MOBIC) 7.5 MG tablet TAKE ONE TABLET BY MOUTH DAILY 12/26/20   Binnie Rail, MD  memantine (NAMENDA) 10 MG tablet Take 1 tablet (10 mg total) by mouth 2 (two) times daily. 12/12/21   Cameron Sprang, MD  omeprazole (PRILOSEC) 40 MG capsule TAKE ONE CAPSULE BY MOUTH DAILY 08/19/21   Binnie Rail, MD  QUEtiapine (SEROQUEL) 50 MG tablet Take 1 tablet (50 mg total) by mouth at bedtime. 12/12/21   Cameron Sprang, MD  ramipril (ALTACE) 10 MG capsule  TAKE ONE CAPSULE BY MOUTH TWICE A DAY 08/06/21   Binnie Rail, MD      Allergies    Patient has no known allergies.    Review of Systems   Review of Systems  Unable to perform ROS: Dementia  All other systems reviewed and are negative.   Physical Exam Updated Vital Signs BP 124/63   Pulse 62 Comment: Simultaneous filing. User may not have seen previous data.  Temp 97.6 F (36.4 C)   Resp 18 Comment: Simultaneous filing. User may not have seen previous data.  Ht '4\' 11"'$  (1.499 m)   Wt 60.3 kg   SpO2 99% Comment: Simultaneous filing. User may not have seen previous data.  BMI 26.86 kg/m  Physical Exam Vitals and nursing note reviewed.  Constitutional:      Appearance: She is well-developed.  HENT:     Head: Normocephalic and atraumatic.     Mouth/Throat:     Mouth: Mucous membranes are moist.     Pharynx: Oropharynx is clear.  Eyes:      Extraocular Movements: Extraocular movements intact.     Pupils: Pupils are equal, round, and reactive to light.  Cardiovascular:     Rate and Rhythm: Normal rate and regular rhythm.  Pulmonary:     Effort: Pulmonary effort is normal.     Breath sounds: Normal breath sounds.  Abdominal:     General: Bowel sounds are normal.     Palpations: Abdomen is soft.  Musculoskeletal:        General: Normal range of motion.     Cervical back: Normal range of motion and neck supple.  Skin:    General: Skin is warm.     Capillary Refill: Capillary refill takes less than 2 seconds.  Neurological:     General: No focal deficit present.     Mental Status: She is alert. She is disoriented.  Psychiatric:        Mood and Affect: Mood normal.        Behavior: Behavior normal.     ED Results / Procedures / Treatments   Labs (all labs ordered are listed, but only abnormal results are displayed) Labs Reviewed  BASIC METABOLIC PANEL - Abnormal; Notable for the following components:      Result Value   Potassium 3.2 (*)    Chloride 97 (*)    Glucose, Bld 125 (*)    BUN 47 (*)    Creatinine, Ser 1.59 (*)    Calcium 8.4 (*)    GFR, Estimated 31 (*)    All other components within normal limits  BRAIN NATRIURETIC PEPTIDE - Abnormal; Notable for the following components:   B Natriuretic Peptide 150.6 (*)    All other components within normal limits  VALPROIC ACID LEVEL - Abnormal; Notable for the following components:   Valproic Acid Lvl 33 (*)    All other components within normal limits  URINALYSIS, ROUTINE W REFLEX MICROSCOPIC - Abnormal; Notable for the following components:   APPearance HAZY (*)    Ketones, ur 5 (*)    Protein, ur 30 (*)    Leukocytes,Ua TRACE (*)    Bacteria, UA RARE (*)    All other components within normal limits  SARS CORONAVIRUS 2 BY RT PCR  CBC WITH DIFFERENTIAL/PLATELET  MAGNESIUM    EKG EKG Interpretation  Date/Time:  Friday January 30 2022 18:55:24  EDT Ventricular Rate:  60 PR Interval:  115 QRS Duration: 102 QT Interval:  431  QTC Calculation: 431 R Axis:   -7 Text Interpretation: Sinus rhythm Atrial premature complexes Borderline short PR interval Abnormal R-wave progression, early transition Left ventricular hypertrophy Borderline T abnormalities, anterior leads No significant change since last tracing Confirmed by Isla Pence [phone]) on 01/30/2022 6:57:02 PM  Radiology DG Chest Port 1 View  Result Date: 01/30/2022 CLINICAL DATA:  Shortness of breath, hypoxia EXAM: PORTABLE CHEST 1 VIEW COMPARISON:  04/17/2014 FINDINGS: There is poor inspiration. Cardiac size is within normal limits. There is previous coronary bypass surgery. Left hemidiaphragm is elevated. There are no signs of alveolar pulmonary edema or focal pulmonary consolidation. There is no pleural effusion or pneumothorax. There is arthroplasty in both shoulders. IMPRESSION: Poor inspiration. There are no signs of pulmonary edema or focal pulmonary consolidation. Electronically Signed   By: Elmer Picker M.D.   On: 01/30/2022 19:24    Procedures Procedures    Medications Ordered in ED Medications  sodium chloride 0.9 % bolus 1,000 mL (0 mLs Intravenous Stopped 01/30/22 2216)  potassium chloride SA (KLOR-CON M) CR tablet 40 mEq (40 mEq Oral Given 01/30/22 2104)  cephALEXin (KEFLEX) capsule 500 mg (500 mg Oral Given 01/30/22 2212)  QUEtiapine (SEROQUEL) tablet 50 mg (50 mg Oral Given 01/30/22 2211)  gabapentin (NEURONTIN) capsule 300 mg (300 mg Oral Given 01/30/22 2212)    ED Course/ Medical Decision Making/ A&P                           Medical Decision Making Amount and/or Complexity of Data Reviewed Labs: ordered. Radiology: ordered.  Risk OTC drugs. Prescription drug management. Decision regarding hospitalization.   This patient presents to the ED for concern of AMS, this involves an extensive number of treatment options, and is a complaint that  carries with it a high risk of complications and morbidity.  The differential diagnosis includes infection, electrolyte abn   Co morbidities that complicate the patient evaluation  dementia, htn, depression, CAD s/p CABG, HLD, DM2, and fibromyalgia   Additional history obtained:  Additional history obtained from epic chart review External records from outside source obtained and reviewed including daughter   Lab Tests:  I Ordered, and personally interpreted labs.  The pertinent results include:  cbc nl; ua + ketones, 21-50 wbcs; bmp with k low at 3.2, bun elevated at 47 and cr elevated at 1.59; bnp 150.6; mg 2.2; covid neg   Imaging Studies ordered:  I ordered imaging studies including cxr  I independently visualized and interpreted imaging which showed  IMPRESSION:  Poor inspiration. There are no signs of pulmonary edema or focal  pulmonary consolidation.   I agree with the radiologist interpretation   Cardiac Monitoring:  The patient was maintained on a cardiac monitor.  I personally viewed and interpreted the cardiac monitored which showed an underlying rhythm of: nsr   Medicines ordered and prescription drug management:  I ordered medication including keflex  for uti  Reevaluation of the patient after these medicines showed that the patient improved I have reviewed the patients home medicines and have made adjustments as needed   Test Considered:  ct   Critical Interventions:  abx  Consults:  Pt d/w Dr. Marlowe Sax ([facility]) for admission.  Problem List / ED Course:  AMS:  likely a combination of depakote and uti. Pt's behavior is worsening.  She took a swing at her daughter.  Per daughter, this is an acute change. Hypokalemia:  pt given kdur AKI:  likely  due to dehydration.  Pt given ivfs.   Reevaluation:  After the interventions noted above, I reevaluated the patient and found that they have :improved   Social Determinants of Health:  Lives in  memory care   Dispostion:  After consideration of the diagnostic results and the patients response to treatment, I feel that the patent would benefit from admission.    Final Clinical Impression(s) / ED Diagnoses Final diagnoses:  AKI (acute kidney injury) ([place])  Dehydration  Hypokalemia  Valproic acid causing adverse effect in therapeutic use, initial encounter  Acute cystitis without hematuria  Metabolic encephalopathy    Rx / DC Orders ED Discharge Orders          Ordered    Probiotic Product (ALIGN) 4 MG CAPS  Daily,   Status:  Discontinued        01/30/22 2148    cephALEXin (KEFLEX) 500 MG capsule  3 times daily,   Status:  Discontinued        01/30/22 2148              Isla Pence, MD 01/30/22 2231    Isla Pence, MD 01/30/22 2342

## 2022-01-30 NOTE — ED Notes (Signed)
Xray at BS 

## 2022-01-30 NOTE — ED Notes (Signed)
Daughter into room

## 2022-01-30 NOTE — ED Notes (Signed)
Pt care taken, family at bedside, is more confused than normal per daughter.

## 2022-01-30 NOTE — Progress Notes (Signed)
Therapist, nutritional Palliative Care Consult Note Telephone: [phone]  Fax: [phone]   Date of encounter: 01/30/22 4:25 PM PATIENT NAME: [name] [address] [place] [address]   [phone] (home)  DOB: 04/04/1931 MRN: [id number] PRIMARY CARE PROVIDER:    Pincus Sanes, MD,  [address] [place] [address] [phone]  REFERRING PROVIDER:   Pincus Sanes, MD [address] [place],  [place] [address] [phone]  RESPONSIBLE PARTY:    Contact Information     Name Relation Home Work [place] Daughter   [phone]   Ahlia, Lemanski   [phone]        I met face to face with patient in patient's independent living facility. Palliative Care was asked to follow this patient by consultation request of  Burns, Bobette Mo, MD to address advance care planning and complex medical decision making. This is a follow up visit.          ASSESSMENT, SYMPTOM MANAGEMENT AND PLAN / RECOMMENDATIONS:   Moderate Mixed Vascular and Neurodegenerative Dementia with mood disturbance (agitation and anxiety)/Delirium with Dementia FAST 7 score 6b Continues on Aricept 10 mg , Buspar 15 mg BID, Cymbalta 60 mg daily. Recommend decrease/wean of Depakote 375 mg given TID to 250 mg TID x 1 week, then decrease to 125 mg BID x 1 week then stop unless different regimen proposed by Neurology Dr Aquino/PCP Dr Lawerance Bach. Delirium likely infectious in nature, so would recommend CXR, UA and CBC with diff to identify etiology and treat appropriately if indicated.  2.  Acute hypoxic respiratory failure O2 sat 88% on room air, question infectious etiology vs oversedation with medication Pt is not currently on O2 and the facility does not supply tanks unless DME is ordered through vendor.  3.  Adverse effect of drug Pt had drop in PLT and low Neutrophil count as well as increased sedation on Depakote 375 mg BID Neuro had been  weaning Depakote and PT and had dose decreased to 125 mg BID Somehow when pt admitted to memory care, her dose of Depakote was 375 mg TID.  4.  Dehydration Poor intake.  Push po fluids even If pt has to be awakened     Follow up Palliative Care Visit: Palliative care will continue to follow for complex medical decision making, advance care planning, and clarification of goals. Return visit after d/c from ED/hospital visit.    This visit was coded based on medical decision making (MDM).  PPS: 40%  HOSPICE ELIGIBILITY/DIAGNOSIS: TBD  Chief Complaint:  Palliative Care is following for chronic medical management in setting of dementia. Daughter notified provider that she was concerned about some ED testing recently, that her mother was restarted on increased Depakote and that pt was not wanting to get oob, sleeping more.Palliative Care is following for medical management of chronic disease in setting of dementia with c/o increased confusion and dysuria.  HISTORY OF PRESENT ILLNESS:  [name] is a 88 y.o. year old female with dementia, HTN, diabetes with peripheral vascular disease and neuropathy, CAD, aortic atherosclerosis, GERD, tubulovillous adenoma of the colon, degenerative arthritis of lumbar spine with cord compression, OA of left shoulder, sciatica, HLD, anemia, gout, depression, RUQ pain and lumbar canal stenosis. Received a call from pt's daughter indicating pt had been to the ED a couple of days ago and she was concerned about some of her results including a leukocytosis of 12.9 with left shift.  UA at that ED visit showed trace HGB, 20 ketones and rare bacteria.  Pt was d/c'd from ED on 01/27/22 back to her memory care facility where she was recently placed on 01/22/22.  Pt had been weaning off Depakote as she was noted to have neutropenia and mild thrombocytopenia and was more somnolent.  Her Neurologist Dr Delice Lesch had weaned her down from Depakote 375 mg BID to 125 mg BID.   When discussing and reviewing facility records, pt was receiving 375 mg TID. Staff indicated she was not wanting to get OOB today and had not eaten.  Was unable to get pt to answer the door when knocking on it so facility manager opened her door.  Pt was found lying on her back in bed, mumbling and talking.  She did not respond to questions which is not her baseline and would drift off to sleep while provider was at bedside asking questions.  Her verbal responses when given were nonsensical word salad which is also not her baseline.  She can usually answer most all questions appropriately. Her O2 sat on room air was 88% which is not her baseline.  Facility does not have O2.  TCT daughter to discuss assessment findings, hypoxia and recommended that pt go to the ER to get her labs, possibly a CXR, work on a possible UA to send for culture.  Daughter is in agreement with plan to send to [facility] by ambulance. Facility staff made aware of daughter's decision.    History obtained from review of EMR, discussion with daughter who is [facility] POA and a psychiatrist, by phone, and/or Ms. Milliner.   Component Ref Range & Units 3 d ago (01/27/22) 1 mo ago (12/20/21) 4 mo ago (09/05/21) 11 mo ago (02/27/21) 1 yr ago (08/08/20) 2 yr ago (12/28/19) 2 yr ago (08/03/19)  Sodium 135 - 145 mmol/L 138  139  140 R  137 R  138 R  136 R  137 R   Potassium 3.5 - 5.1 mmol/L 3.1 Low   3.9  4.4 R  4.4 R  4.9 R  5.1 R  5.0 R   Chloride 98 - 111 mmol/L 97 Low   100  103 R  101 R  103 R  102 R  100 R   CO2 22 - 32 mmol/L $RemoveB'28  30  29 'PgyEAzzW$ R  30 R  29 R  27 R  32 R   Glucose, Bld 70 - 99 mg/dL 140 High   88 CM  96  120 High   101 High   120 High  R, CM  132 High    Comment: Glucose reference range applies only to samples taken after fasting for at least 8 hours.  BUN 8 - 23 mg/dL $Remove'12  14  28 'CZmYzLy$ High  R  17 R  19 R  21 R  14 R   Creatinine, Ser 0.44 - 1.00 mg/dL 0.80  0.77  1.13 R  0.91 R  1.00 R  0.97 High  R, CM  0.89 R    Calcium 8.9 - 10.3 mg/dL 8.9  9.5  9.4 R  9.3 R  9.4 R  9.4 R  9.4 R   GFR, Estimated >60 mL/min >60          Component Ref Range & Units 3 d ago (01/27/22) 1 mo ago (12/20/21) 4 mo ago (09/05/21) 11 mo ago (02/27/21) 1 yr ago (10/17/20) 1 yr ago (08/08/20) 2 yr  ago (12/28/19)  WBC 4.0 - 10.5 K/uL 12.9 High   5.0  5.6  4.9  5.3  5.8  5.8 R   RBC 3.87 - 5.11 MIL/uL 4.21  4.29  3.96 R  4.20 R  4.13 R  3.75 Low  R  3.82 R   Hemoglobin 12.0 - 15.0 g/dL 13.7  13.6  12.5  13.1  12.0  9.8 Low   10.6 Low  R   HCT 36.0 - 46.0 % 40.1  41.3  38.1  40.7  37.3  31.2 Low   33.6 Low  R   MCV 80.0 - 100.0 fL 95.2  96.3  96.4 R  96.8 R  90.4 R  83.2 R  88.0   MCH 26.0 - 34.0 pg 32.5  31.7      27.7 R   MCHC 30.0 - 36.0 g/dL 34.2  32.9  32.9  32.3  32.2  31.3  31.5 Low  R   RDW 11.5 - 15.5 % 13.1  12.7  14.6  13.8  19.1 High   16.3 High   13.6 R   Platelets 150 - 400 K/uL 152  125 Low  CM  192.0 R  176.0 R  192.0 R  211.0 R  193 R   nRBC 0.0 - 0.2 % 0.0  0.0 CM        Neutrophils Relative % % 72   47.7 R  54.1 R  42.5 Low  R  58.1 R  46.3   Neutro Abs 1.7 - 7.7 K/uL 9.2 High    2.6 R  2.7 R  2.2 R  3.4 R  2,685 R   Lymphocytes Relative % 15   33.5 R  31.5 R  37.9 R  30.1 R    Lymphs Abs 0.7 - 4.0 K/uL 1.9   1.9  1.5  2.0  1.8  2,175 R   Monocytes Relative % 12   10.9 R  7.6 R  8.6 R  7.5 R  9.2   Monocytes Absolute 0.1 - 1.0 K/uL 1.6 High    0.6  0.4  0.5  0.4    Eosinophils Relative % 0   7.3 High  R  6.2 High  R  10.4 High  R  3.7 R  6.5   Eosinophils Absolute 0.0 - 0.5 K/uL 0.0   0.4 R  0.3 R  0.6 R  0.2 R  377 R   Basophils Relative % 0   0.6 R  0.6 R  0.6 R  0.6 R  0.5   Basophils Absolute 0.0 - 0.1 K/uL 0.0   0.0  0.0  0.0  0.0  29 R   Immature Granulocytes % 1         Abs Immature Granulocytes 0.00 - 0.07 K/uL 0.10 High           Component Ref Range & Units 2 d ago (01/28/22) 1 mo ago (12/20/21) 7 mo ago (06/17/21) 11 mo ago (02/27/21) 1 yr ago (09/18/20) 3 yr ago (11/18/18) 7 yr ago (04/17/14) 7  yr ago (04/17/14)  Color, Urine YELLOW YELLOW  YELLOW  YELLOW  YELLOW R  YELLOW R  STRAW Abnormal    YELLOW   APPearance CLEAR CLEAR  CLEAR  CLEAR  CLEAR R  CLEAR R  CLEAR   CLEAR   Specific Gravity, Urine 1.005 - 1.030 1.019  1.015  1.024  1.025 R  1.020 R  1.005   1.012  pH 5.0 - 8.0 6.0  7.0  6.5  5.5  6.5  7.0   7.5   Glucose, UA NEGATIVE mg/dL NEGATIVE  NEGATIVE  NEGATIVE    NEGATIVE   NEGATIVE   Hgb urine dipstick NEGATIVE SMALL Abnormal   NEGATIVE  NEGATIVE  NEGATIVE R  NEGATIVE R  NEGATIVE   NEGATIVE   Bilirubin Urine NEGATIVE NEGATIVE  NEGATIVE  NEGATIVE  SMALL Abnormal  R  NEGATIVE R  NEGATIVE   NEGATIVE   Ketones, ur NEGATIVE mg/dL 20 Abnormal   NEGATIVE  NEGATIVE  TRACE Abnormal  R  NEGATIVE R  NEGATIVE   NEGATIVE   Protein, ur NEGATIVE mg/dL 100 Abnormal   TRACE Abnormal   30 Abnormal     NEGATIVE   30 Abnormal    Nitrite NEGATIVE NEGATIVE  NEGATIVE  NEGATIVE  NEGATIVE R  NEGATIVE R  NEGATIVE   NEGATIVE   Leukocytes,Ua NEGATIVE NEGATIVE  NEGATIVE CM  NEGATIVE  NEGATIVE R  NEGATIVE R  NEGATIVE CM     RBC / HPF 0 - 5 RBC/hpf 0-5   0-5  none seen R  none seen R      WBC, UA 0 - 5 WBC/hpf 0-5    0-2/hpf R  0-2/hpf R   3-6 R    Bacteria, UA NONE SEEN RARE Abnormal     Rare(<10/hpf) Abnormal  R    RARE R    Squamous Epithelial / LPF 0 - 5 0-5   0-5  Rare(0-4/hpf) R  Rare(0-4/hpf) R   RARE R    Mucus  PRESENT   PRESENT CM        Comment: Performed at [facility], [address], [place], [place] [address]  Total Protein, Urine     30 Abnormal  R  TRACE Abnormal  R      Urine Glucose     NEGATIVE R  NEGATIVE R      Urobilinogen, UA     0.2 R  0.2 R    0.2 R   Mucus, UA     Presence of Abnormal  R       Leukocytes, UA            01/27/22 CT lumbar spine: CLINICAL DATA:  Low back pain with difficulty walking.   EXAM: CT LUMBAR SPINE WITHOUT CONTRAST   TECHNIQUE: Multidetector CT imaging of the lumbar spine was performed without intravenous contrast administration.  Multiplanar CT image reconstructions were also generated.   RADIATION DOSE REDUCTION: This exam was performed according to the departmental dose-optimization program which includes automated exposure control, adjustment of the mA and/or kV according to patient size and/or use of iterative reconstruction technique.   COMPARISON:  Lumbar MRI 07/24/2021   FINDINGS: Segmentation: 5 lumbar vertebra.  Lowest disc space L5-S1   Alignment: 7 mm anterolisthesis L4-5.  4 mm anterolisthesis L5-S1   Vertebrae: Negative for fracture or mass   Paraspinal and other soft tissues: Extensive atherosclerotic calcification aorta and iliac arteries without aneurysm. Negative for paraspinous mass or adenopathy.   Disc levels: T12-L1: Central and right-sided disc protrusion. Mild subarticular stenosis bilaterally   L1-2: Disc degeneration with disc bulging.  Negative for stenosis   L2-3: Mild disc and facet degeneration.  Negative for stenosis   L3-4: Mild disc degeneration. Moderate to advanced facet degeneration bilaterally. Negative for spinal or foraminal stenosis   L4-5: 7 mm anterolisthesis. Advanced disc and facet degeneration. Mild to moderate spinal stenosis. Mild foraminal  stenosis bilaterally. No change from the prior MRI   L5-S1: 5 mm anterolisthesis. Bilateral facet degeneration. Moderate central canal stenosis. Moderate subarticular and foraminal stenosis bilaterally. No change from the prior MRI.   IMPRESSION: 1. Negative for lumbar fracture. 2. Multilevel degenerative change in the lumbar spine. Mild to moderate spinal stenosis L4-5 with mild foraminal stenosis bilaterally. 3. 5 mm anterolisthesis L5-S1 with moderate central canal stenosis and moderate subarticular and foraminal stenosis bilaterally. 4. Aortic atherosclerosis.   Aortic Atherosclerosis (ICD10-I70.0).  I reviewed EMR  available labs, imaging studies since last visit.  ROS obtained daughter by phone Pt  unable to provide any hx currently   Physical Exam: Current and past weights: weight 133 lbs as of 12/20/21, prior weight 138 lbs 6.4 ounces as of 09/05/21 Constitutional: NAD General: WN, WD CV: S1S2, IRIR, no LE edema Pulmonary: CTAB, diminished in bases, no increased work of breathing, no cough, room air Abdomen: normo-active BS + 4 quadrants, soft and non tender MSK: moves all extremities, lying in bed Neuro:  arousable, Lethargic. Psych: responding to internal stimuli/auditory hallucinations.  Disoriented, drowsy   CURRENT PROBLEM LIST:  Patient Active Problem List   Diagnosis Date Noted   Encounter for therapeutic drug level monitoring 01/13/2022   Encounter for testing for latent tuberculosis 01/13/2022   Thrombocytopenia ([place]) 01/08/2022   Delirium with dementia 01/08/2022   Palliative care encounter 09/21/2021   Urinary frequency 09/18/2020   Aortic atherosclerosis ([place]) 08/07/2020   Moderate mixed vascular and neurodegenerative dementia with mood disturbance ([place]) 07/27/2017   Chronic RUQ pain 05/28/2016   Right knee pain 06/14/2015   Diabetic neuropathy ([place]) 03/21/2014   Bunion 03/21/2014   PVD (pulmonary valve disease) 01/23/2014   Onychomycosis 12/20/2013   PVD (peripheral vascular disease) ([place]) 12/19/2013   Gout 12/13/2013   Depression    Lumbar canal stenosis 05/26/2013   Neuralgia neuritis, sciatic nerve 05/26/2013   Degenerative arthritis of lumbar spine with cord compression 04/24/2013   Arthritis of shoulder region, left 09/11/2011   Type II diabetes mellitus with neurological manifestations ([place]) 01/17/2009   Anemia 10/15/2008   CAD, NATIVE VESSEL 06/09/2008   Hyperlipidemia 06/15/2007   Essential hypertension 06/15/2007   GERD 06/15/2007   TUBULOVILLOUS ADENOMA, 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   DIVERTICULOSIS, 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   PAST MEDICAL HISTORY:  Active Ambulatory Problems    Diagnosis Date Noted   TUBULOVILLOUS ADENOMA, 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   Type II  diabetes mellitus with neurological manifestations ([place]) 01/17/2009   Hyperlipidemia 06/15/2007   Anemia 10/15/2008   Essential hypertension 06/15/2007   CAD, NATIVE VESSEL 06/09/2008   GERD 06/15/2007   DIVERTICULOSIS, 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   Arthritis of shoulder region, left 09/11/2011   Degenerative arthritis of lumbar spine with cord compression 04/24/2013   Depression    Gout 12/13/2013   PVD (peripheral vascular disease) ([place]) 12/19/2013   Onychomycosis 12/20/2013   PVD (pulmonary valve disease) 01/23/2014   Diabetic neuropathy ([place]) 03/21/2014   Bunion 03/21/2014   Right knee pain 06/14/2015   Chronic RUQ pain 05/28/2016   Moderate mixed vascular and neurodegenerative dementia with mood disturbance ([place]) 07/27/2017   Aortic atherosclerosis ([place]) 08/07/2020   Urinary frequency 09/18/2020   Lumbar canal stenosis 05/26/2013   Neuralgia neuritis, sciatic nerve 05/26/2013   Palliative care encounter 09/21/2021   Thrombocytopenia ([place]) 01/08/2022   Delirium with dementia 01/08/2022   Encounter for therapeutic drug level monitoring 01/13/2022   Encounter for testing for latent tuberculosis 01/13/2022   Resolved Ambulatory Problems    Diagnosis Date  Noted   Dermatophytosis of nail 02/20/2009   HEMORRHOIDS, INTERNAL 03/02/2007   GASTRITIS 03/12/2009   HIATAL HERNIA 10/29/2000   UNSPECIFIED HEMORRHAGE OF GASTROINTESTINAL TRACT 12/21/2008   DRY SKIN 02/20/2009   ARTHRITIS 06/15/2007   Enthesopathy of hip region 11/28/2009   Fibromyalgia syndrome 06/15/2007   INSOMNIA-SLEEP DISORDER-UNSPEC 09/18/2009   Headache(784.0) 04/08/2009   PALPITATIONS, RECURRENT 04/15/2009   Wheezing 09/18/2009   FECAL OCCULT BLOOD 01/16/2009   COLONIC POLYPS, HX OF 09/18/2008   Memory loss 07/23/2010   Palpitations 07/06/2012   Sciatica 09/05/2012   Sacroiliac joint dysfunction of right side 03/22/2013   Non-compliant behavior 12/05/2013   Radiculopathy of lumbar region 01/31/2014   HAV (hallux  abducto valgus) 03/21/2014   Tenosynovitis of ankle 03/21/2014   Pronation deformity of ankle, acquired 03/21/2014   Chest pain 04/17/2014   Pain in lower limb 07/18/2014   Non-compliant behavior 11/23/2014   Acute bronchitis 12/03/2014   Cough 05/02/2015   Abdominal pain, epigastric 09/02/2015   AP (abdominal pain) 09/02/2015   Mild cognitive impairment 12/12/2015   Buttock pain 10/30/2016   Abscess of right foot 12/02/2017   Cellulitis of foot 12/05/2017   Benign hypertension 01/04/2014   Past Medical History:  Diagnosis Date   DIABETES MELLITUS, TYPE II    FIBROMYALGIA    Gout    HYPERLIPIDEMIA    HYPERTENSION    Insomnia    OA (osteoarthritis)    S/P CABG x 1    S/P inguinal hernia repair    UNSPECIFIED ANEMIA    SOCIAL HX:  Social History   Tobacco Use   Smoking status: Former    Types: Cigarettes    Quit date: 04/26/1970    Years since quitting: 51.8   Smokeless tobacco: Never  Substance Use Topics   Alcohol use: Not Currently    Alcohol/week: 1.0 standard drink of alcohol    Types: 1 Glasses of wine per week   FAMILY HX:  Family History  Problem Relation Age of Onset   Cancer Mother        Stomach   Stomach cancer Mother 106   Hypertension Mother    Heart disease Father 44       MI   Diabetes Father    Heart attack Father    Vascular Disease Sister    Stroke Sister    Dementia Sister    Diabetes Sister    Parkinsonism Sister    Stroke Sister    Heart disease Sister    Heart attack Brother    Stroke Daughter    Diabetes Daughter    Hypertension Daughter    Colon cancer Neg Hx        Preferred Pharmacy: ALLERGIES: No Known Allergies   PERTINENT MEDICATIONS:  Facility-Administered Encounter Medications as of 01/30/2022  Medication   cephALEXin (KEFLEX) capsule 500 mg   gabapentin (NEURONTIN) capsule 300 mg   QUEtiapine (SEROQUEL) tablet 50 mg   Outpatient Encounter Medications as of 01/30/2022  Medication Sig   acetaminophen (TYLENOL)  500 MG tablet Take 1,000 mg by mouth 2 (two) times daily.    amLODipine (NORVASC) 5 MG tablet TAKE ONE TABLET BY MOUTH TWICE A DAY   aspirin EC 81 MG tablet Take 81 mg by mouth daily.   busPIRone (BUSPAR) 15 MG tablet Take 1 tablet (15 mg total) by mouth 2 (two) times daily.   carvedilol (COREG) 6.25 MG tablet TAKE ONE TABLET BY MOUTH TWICE A DAY   cephALEXin (KEFLEX) 500 MG capsule  Take 1 capsule (500 mg total) by mouth 3 (three) times daily.   cetirizine (ZYRTEC) 10 MG tablet TAKE ONE TABLET BY MOUTH DAILY (Patient taking differently: Take 10 mg by mouth at bedtime.)   Cholecalciferol (D3-1000) 25 MCG (1000 UT) capsule Take 1,000 Units by mouth daily.   diclofenac sodium (VOLTAREN) 1 % GEL Apply 2 g topically 4 (four) times daily as needed (pain).   donepezil (ARICEPT) 10 MG tablet TAKE ONE TABLET BY MOUTH DAILY   DULoxetine (CYMBALTA) 60 MG capsule Take 1 capsule (60 mg total) by mouth daily.   ferrous sulfate 324 MG TBEC Take 324 mg by mouth daily.   gabapentin (NEURONTIN) 300 MG capsule TAKE ONE CAPSULE BY MOUTH EVERY NIGHT AT BEDTIME   MELATONIN PO Take 3 mg by mouth at bedtime as needed.   meloxicam (MOBIC) 7.5 MG tablet TAKE ONE TABLET BY MOUTH DAILY   memantine (NAMENDA) 10 MG tablet Take 1 tablet (10 mg total) by mouth 2 (two) times daily.   omeprazole (PRILOSEC) 40 MG capsule TAKE ONE CAPSULE BY MOUTH DAILY   Probiotic Product (ALIGN) 4 MG CAPS Take 1 capsule (4 mg total) by mouth daily.   QUEtiapine (SEROQUEL) 50 MG tablet Take 1 tablet (50 mg total) by mouth at bedtime.   ramipril (ALTACE) 10 MG capsule TAKE ONE CAPSULE BY MOUTH TWICE A DAY     CODE STATUS: Currently full code    Thank you for the opportunity to participate in the care of Ms. Sannes.  The palliative care team will continue to follow. Please call our office at [phone] if we can be of additional assistance.   Marijo Conception, FNP-C  COVID-19 PATIENT SCREENING TOOL Asked and negative response unless  otherwise noted:  Have you had symptoms of covid, tested positive or been in contact with someone with symptoms/positive test in the past 5-10 days? No

## 2022-01-30 NOTE — ED Notes (Signed)
MD at BS

## 2022-01-31 ENCOUNTER — Observation Stay (HOSPITAL_COMMUNITY): Payer: Medicare Other

## 2022-01-31 DIAGNOSIS — F0393 Unspecified dementia, unspecified severity, with mood disturbance: Secondary | ICD-10-CM | POA: Diagnosis present

## 2022-01-31 DIAGNOSIS — G9341 Metabolic encephalopathy: Secondary | ICD-10-CM

## 2022-01-31 DIAGNOSIS — R410 Disorientation, unspecified: Secondary | ICD-10-CM | POA: Diagnosis not present

## 2022-01-31 DIAGNOSIS — E119 Type 2 diabetes mellitus without complications: Secondary | ICD-10-CM

## 2022-01-31 DIAGNOSIS — F0390 Unspecified dementia without behavioral disturbance: Secondary | ICD-10-CM

## 2022-01-31 DIAGNOSIS — F03918 Unspecified dementia, unspecified severity, with other behavioral disturbance: Secondary | ICD-10-CM | POA: Diagnosis present

## 2022-01-31 DIAGNOSIS — N390 Urinary tract infection, site not specified: Secondary | ICD-10-CM

## 2022-01-31 DIAGNOSIS — R2689 Other abnormalities of gait and mobility: Secondary | ICD-10-CM | POA: Diagnosis not present

## 2022-01-31 DIAGNOSIS — I2510 Atherosclerotic heart disease of native coronary artery without angina pectoris: Secondary | ICD-10-CM | POA: Diagnosis present

## 2022-01-31 DIAGNOSIS — E876 Hypokalemia: Secondary | ICD-10-CM | POA: Diagnosis present

## 2022-01-31 DIAGNOSIS — N3000 Acute cystitis without hematuria: Secondary | ICD-10-CM

## 2022-01-31 DIAGNOSIS — Z951 Presence of aortocoronary bypass graft: Secondary | ICD-10-CM | POA: Diagnosis not present

## 2022-01-31 DIAGNOSIS — G4700 Insomnia, unspecified: Secondary | ICD-10-CM | POA: Diagnosis present

## 2022-01-31 DIAGNOSIS — R278 Other lack of coordination: Secondary | ICD-10-CM | POA: Diagnosis not present

## 2022-01-31 DIAGNOSIS — E860 Dehydration: Secondary | ICD-10-CM | POA: Diagnosis present

## 2022-01-31 DIAGNOSIS — Z800 Family history of malignant neoplasm of digestive organs: Secondary | ICD-10-CM | POA: Diagnosis not present

## 2022-01-31 DIAGNOSIS — R41841 Cognitive communication deficit: Secondary | ICD-10-CM | POA: Diagnosis not present

## 2022-01-31 DIAGNOSIS — Z1152 Encounter for screening for COVID-19: Secondary | ICD-10-CM | POA: Diagnosis not present

## 2022-01-31 DIAGNOSIS — M159 Polyosteoarthritis, unspecified: Secondary | ICD-10-CM | POA: Diagnosis not present

## 2022-01-31 DIAGNOSIS — R0902 Hypoxemia: Secondary | ICD-10-CM | POA: Diagnosis present

## 2022-01-31 DIAGNOSIS — K219 Gastro-esophageal reflux disease without esophagitis: Secondary | ICD-10-CM | POA: Diagnosis present

## 2022-01-31 DIAGNOSIS — G8929 Other chronic pain: Secondary | ICD-10-CM | POA: Diagnosis present

## 2022-01-31 DIAGNOSIS — N179 Acute kidney failure, unspecified: Secondary | ICD-10-CM | POA: Diagnosis not present

## 2022-01-31 DIAGNOSIS — M797 Fibromyalgia: Secondary | ICD-10-CM | POA: Diagnosis present

## 2022-01-31 DIAGNOSIS — F32A Depression, unspecified: Secondary | ICD-10-CM | POA: Diagnosis present

## 2022-01-31 DIAGNOSIS — M19031 Primary osteoarthritis, right wrist: Secondary | ICD-10-CM | POA: Diagnosis present

## 2022-01-31 DIAGNOSIS — E785 Hyperlipidemia, unspecified: Secondary | ICD-10-CM | POA: Diagnosis present

## 2022-01-31 DIAGNOSIS — M6281 Muscle weakness (generalized): Secondary | ICD-10-CM | POA: Diagnosis not present

## 2022-01-31 DIAGNOSIS — M109 Gout, unspecified: Secondary | ICD-10-CM | POA: Diagnosis present

## 2022-01-31 DIAGNOSIS — I10 Essential (primary) hypertension: Secondary | ICD-10-CM | POA: Diagnosis present

## 2022-01-31 HISTORY — DX: Hypokalemia: E87.6

## 2022-01-31 HISTORY — DX: Acute kidney failure, unspecified: N17.9

## 2022-01-31 HISTORY — DX: Metabolic encephalopathy: G93.41

## 2022-01-31 LAB — GLUCOSE, CAPILLARY
Glucose-Capillary: 110 mg/dL — ABNORMAL HIGH (ref 70–99)
Glucose-Capillary: 109 mg/dL — ABNORMAL HIGH (ref 70–99)
Glucose-Capillary: 170 mg/dL — ABNORMAL HIGH (ref 70–99)
Glucose-Capillary: 109 mg/dL — ABNORMAL HIGH (ref 70–99)

## 2022-01-31 LAB — AMMONIA: Ammonia: <10 umol/L (ref 9–35)

## 2022-01-31 LAB — COMPREHENSIVE METABOLIC PANEL
ALT: 13 U/L (ref 0–44)
AST: 18 U/L (ref 15–41)
Albumin: 3 g/dL — ABNORMAL LOW (ref 3.5–5.0)
Alkaline Phosphatase: 62 U/L (ref 38–126)
Anion gap: 11 (ref 5–15)
BUN: 32 mg/dL — ABNORMAL HIGH (ref 8–23)
CO2: 26 mmol/L (ref 22–32)
Calcium: 8.9 mg/dL (ref 8.9–10.3)
Chloride: 102 mmol/L (ref 98–111)
Creatinine, Ser: 0.86 mg/dL (ref 0.44–1.00)
GFR, Estimated: >60 mL/min (ref >60)
Glucose, Bld: 114 mg/dL — ABNORMAL HIGH (ref 70–99)
Potassium: 3.3 mmol/L — ABNORMAL LOW (ref 3.5–5.1)
Sodium: 139 mmol/L (ref 135–145)
Total Bilirubin: 1.4 mg/dL — ABNORMAL HIGH (ref 0.3–1.2)
Total Protein: 7.1 g/dL (ref 6.5–8.1)

## 2022-01-31 LAB — VITAMIN B12: Vitamin B-12: 725 pg/mL (ref 180–914)

## 2022-01-31 LAB — MRSA NEXT GEN BY PCR, NASAL: MRSA by PCR Next Gen: NOT DETECTED

## 2022-01-31 LAB — TSH: TSH: 1.306 u[IU]/mL (ref 0.350–4.500)

## 2022-01-31 MED ORDER — ACETAMINOPHEN 500 MG PO TABS
1000.0000 mg | ORAL_TABLET | Freq: Two times a day (BID) | ORAL | Status: DC
  Administered 2022-01-31 – 2022-02-02 (×4): 1000 mg via ORAL
  Filled 2022-01-31 (×5): qty 2

## 2022-01-31 MED ORDER — ACETAMINOPHEN 325 MG PO TABS
650.0000 mg | ORAL_TABLET | Freq: Four times a day (QID) | ORAL | Status: DC | PRN
  Administered 2022-02-03: 650 mg via ORAL

## 2022-01-31 MED ORDER — DIVALPROEX SODIUM 125 MG PO DR TAB: 125.0000 mg | DELAYED_RELEASE_TABLET | Freq: Every day | ORAL | Status: DC

## 2022-01-31 MED ORDER — SODIUM CHLORIDE 0.9 % IV SOLN: INTRAVENOUS | Status: DC

## 2022-01-31 MED ORDER — POLYETHYLENE GLYCOL 3350 17 G PO PACK
17.0000 g | PACK | Freq: Every day | ORAL | Status: DC
  Administered 2022-02-01 – 2022-02-04 (×2): 17 g via ORAL
  Filled 2022-01-31 (×3): qty 1

## 2022-01-31 MED ORDER — DONEPEZIL HCL 10 MG PO TABS
10.0000 mg | ORAL_TABLET | Freq: Every day | ORAL | Status: DC
  Administered 2022-01-31 – 2022-02-04 (×5): 10 mg via ORAL
  Filled 2022-01-31 (×5): qty 1

## 2022-01-31 MED ORDER — CARVEDILOL 6.25 MG PO TABS
6.2500 mg | ORAL_TABLET | Freq: Two times a day (BID) | ORAL | Status: DC
  Administered 2022-01-31 – 2022-02-04 (×9): 6.25 mg via ORAL
  Filled 2022-01-31 (×9): qty 1

## 2022-01-31 MED ORDER — MELOXICAM 7.5 MG PO TABS
7.5000 mg | ORAL_TABLET | Freq: Every day | ORAL | Status: DC
  Administered 2022-01-31 – 2022-02-04 (×5): 7.5 mg via ORAL
  Filled 2022-01-31 (×5): qty 1

## 2022-01-31 MED ORDER — ACETAMINOPHEN 650 MG RE SUPP: 650.0000 mg | Freq: Four times a day (QID) | RECTAL | Status: DC | PRN

## 2022-01-31 MED ORDER — DIVALPROEX SODIUM 125 MG PO DR TAB: 125.0000 mg | DELAYED_RELEASE_TABLET | Freq: Two times a day (BID) | ORAL | Status: DC

## 2022-01-31 MED ORDER — MEMANTINE HCL 10 MG PO TABS
10.0000 mg | ORAL_TABLET | Freq: Two times a day (BID) | ORAL | Status: DC
  Administered 2022-01-31 – 2022-02-04 (×9): 10 mg via ORAL
  Filled 2022-01-31 (×9): qty 1

## 2022-01-31 MED ORDER — SENNOSIDES-DOCUSATE SODIUM 8.6-50 MG PO TABS
1.0000 | ORAL_TABLET | Freq: Two times a day (BID) | ORAL | Status: DC
  Administered 2022-02-01 – 2022-02-04 (×7): 1 via ORAL
  Filled 2022-01-31 (×7): qty 1

## 2022-01-31 MED ORDER — GABAPENTIN 300 MG PO CAPS
300.0000 mg | ORAL_CAPSULE | Freq: Every day | ORAL | Status: DC
  Administered 2022-02-01 – 2022-02-03 (×4): 300 mg via ORAL
  Filled 2022-01-31 (×4): qty 1

## 2022-01-31 MED ORDER — BUSPIRONE HCL 5 MG PO TABS
15.0000 mg | ORAL_TABLET | Freq: Two times a day (BID) | ORAL | Status: DC
  Administered 2022-01-31 – 2022-02-04 (×9): 15 mg via ORAL
  Filled 2022-01-31 (×9): qty 3

## 2022-01-31 MED ORDER — AMLODIPINE BESYLATE 5 MG PO TABS
5.0000 mg | ORAL_TABLET | Freq: Two times a day (BID) | ORAL | Status: DC
  Administered 2022-01-31 – 2022-02-01 (×4): 5 mg via ORAL
  Filled 2022-01-31 (×4): qty 1

## 2022-01-31 MED ORDER — ASPIRIN 81 MG PO TBEC
81.0000 mg | DELAYED_RELEASE_TABLET | Freq: Every day | ORAL | Status: DC
  Administered 2022-02-01 – 2022-02-04 (×4): 81 mg via ORAL
  Filled 2022-01-31 (×4): qty 1

## 2022-01-31 MED ORDER — CEPHALEXIN 500 MG PO CAPS
500.0000 mg | ORAL_CAPSULE | Freq: Two times a day (BID) | ORAL | Status: DC
  Administered 2022-01-31 – 2022-02-04 (×9): 500 mg via ORAL
  Filled 2022-01-31 (×9): qty 1

## 2022-01-31 MED ORDER — DIVALPROEX SODIUM 250 MG PO DR TAB
250.0000 mg | DELAYED_RELEASE_TABLET | Freq: Two times a day (BID) | ORAL | Status: DC
  Administered 2022-02-01 – 2022-02-04 (×8): 250 mg via ORAL
  Filled 2022-01-31 (×8): qty 1

## 2022-01-31 MED ORDER — PANTOPRAZOLE SODIUM 40 MG PO TBEC
40.0000 mg | DELAYED_RELEASE_TABLET | Freq: Every day | ORAL | Status: DC
  Administered 2022-01-31 – 2022-02-04 (×5): 40 mg via ORAL
  Filled 2022-01-31 (×5): qty 1

## 2022-01-31 MED ORDER — QUETIAPINE FUMARATE 25 MG PO TABS
50.0000 mg | ORAL_TABLET | Freq: Every day | ORAL | Status: DC
  Administered 2022-02-01 – 2022-02-03 (×4): 50 mg via ORAL
  Filled 2022-01-31 (×4): qty 2

## 2022-01-31 NOTE — Progress Notes (Signed)
Talk with pt's daughter about the night time medications. Pt's daughter stated "she is ok to hold the medications if her mother is sleep." RN will continue monitor the pt.

## 2022-01-31 NOTE — Progress Notes (Signed)
Spoke with patient's daughter Dr. Guadlupe Spanish on the phone.  Dr. [name] Heck requested that patient be started on her arthritis medications (on PTA med list).  Per daughter, patient is AAOx4 and independent at baseline, but has been bedbound for the past 5 days with new onset confusion and hallucinations.  RN encouraged daughter to call back with any concerns.  Patient currently alert to self only, exhibited rambling speech but agreeable to letting staff perform hygiene, check VS and CBG.  Angie Fava, RN

## 2022-01-31 NOTE — Hospital Course (Addendum)
[name] is an 88 yo female with PMH dementia, hypertension, depression, CAD status post CABG, hyperlipidemia, type 2 diabetes, fibromyalgia who presented to the ED with altered mentation.   She was reported to have had hypoxia prior to EMS arrival however was found to have oxygen saturations low 90s.  She was placed on 2 L and brought to the ER for further work-up. She had recently been transferred to memory care unit on 01/22/2022.  She had been weaned off of Depakote by psychiatry outpatient however was placed back on original dosing upon transfer to memory care. Notable lab findings on work-up include BUN 47, creatinine 1.59 up from normal baseline's. Initial WBC 12.9. UA showed trace LE, negative nitrite, 21-50 WBC, hyaline casts, rare bacteria. She was started on Keflex. IV fluids also initiated on admission.

## 2022-01-31 NOTE — Progress Notes (Signed)
Progress Note    [name]   OVF:[id number]  DOB: Oct 03, 1930  DOA: 01/30/2022     0 PCP: Binnie Rail, MD  Initial CC: confusion   Hospital Course: [name] is an 88 yo female with PMH dementia, hypertension, depression, CAD status post CABG, hyperlipidemia, type 2 diabetes, fibromyalgia who presented to the ED with altered mentation.   She was reported to have had hypoxia prior to EMS arrival however was found to have oxygen saturations low 90s.  She was placed on 2 L and brought to the ER for further work-up. She had recently been transferred to memory care unit on 01/22/2022.  She had been weaned off of Depakote by psychiatry outpatient however was placed back on original dosing upon transfer to memory care. Notable lab findings on work-up include BUN 47, creatinine 1.59 up from normal baseline's. Initial WBC 12.9. UA showed trace LE, negative nitrite, 21-50 WBC, hyaline casts, rare bacteria. She was started on Keflex. IV fluids also initiated on admission.  Interval History:  No events overnight.  Seen this morning and again in the afternoon when her daughter had arrived.  Patient was able to follow commands this morning but still overtly confused.  No distress.  Assessment and Plan:  AKI -Suspected due to prerenal from poor oral intake - baseline creatinine ~ 1 - patient presents with increase in creat >0.3 mg/dL above baseline, creat increase >1.5x baseline presumed to have occurred within past 7 days PTA - creat 1.59, BUN 47 on admission - continue IVF; renal fxn improving    Acute cystitis -Encephalopathy possibly accounting as symptom as patient unable to provide much collateral information -UA notable for trace LE, negative nitrite, rare bacteria, 21-50 WBC - continue keflex - follow up cultures    Acute metabolic encephalopathy Likely multifactorial from AKI/dehydration, and ?UTI.  No focal neurodeficit on exam.  Also probable contribution from  underlying dementia -CT head unremarkable for acute findings, shows underlying cerebral atrophy - TSH normal, B12 725, NH3 negative - depakote level 33 (low) ---was being tapered off prior to tx to memory care   Hypokalemia -Replete as needed   Diet controlled type 2 diabetes A1c 5.8 on 09/05/2021. -CBG checks ACHS   Dementia Mood disorder -Discussed with daughter, prefers Depakote to be tapered back off since was restarted upon transfer to memory care -I discussed with psychiatry and taper ordered per recommendations for depakote   Hypertension -Continue amlodipine and Coreg   GERD -Continue PPI   Chronic pain -Continue gabapentin   CAD status post CABG Stable.  EKG without acute ischemic changes and patient is not endorsing chest pain. -Continue beta-blocker and asa   Old records reviewed in assessment of this patient  Antimicrobials: Keflex 10/13 >> current   DVT prophylaxis:  SCDs Start: 01/31/22 0346   Code Status:   Code Status: Full Code  Mobility Assessment (last 72 hours)     Mobility Assessment     Row Name 01/31/22 0453           Does patient have an order for bedrest or is patient medically unstable No - Continue assessment       What is the highest level of mobility based on the progressive mobility assessment? --       Is the above level different from baseline mobility prior to current illness? --                Barriers to discharge: none Disposition Plan:  SNF Status is: Inpt  Objective: Blood pressure (!) 173/87, pulse 97, temperature 100.3 F (37.9 C), temperature source Oral, resp. rate 19, height '4\' 11"'$  (1.499 m), weight 60.3 kg, SpO2 98 %.  Examination:  Physical Exam Constitutional:      Comments: Grossly confused   HENT:     Head: Normocephalic and atraumatic.     Mouth/Throat:     Mouth: Mucous membranes are moist.  Eyes:     Extraocular Movements: Extraocular movements intact.  Cardiovascular:     Rate and Rhythm:  Normal rate and regular rhythm.  Pulmonary:     Effort: Pulmonary effort is normal.     Breath sounds: Normal breath sounds.  Abdominal:     General: Bowel sounds are normal. There is no distension.     Palpations: Abdomen is soft.     Tenderness: There is no abdominal tenderness.  Musculoskeletal:        General: Normal range of motion.     Cervical back: Normal range of motion.  Skin:    General: Skin is warm.  Neurological:     Mental Status: She is alert. She is disoriented.      Consultants:    Procedures:    Data Reviewed: Results for orders placed or performed during the hospital encounter of 01/30/22 (from the past 24 hour(s))  Basic metabolic panel     Status: Abnormal   Collection Time: 01/30/22  7:28 PM  Result Value Ref Range   Sodium 139 135 - 145 mmol/L   Potassium 3.2 (L) 3.5 - 5.1 mmol/L   Chloride 97 (L) 98 - 111 mmol/L   CO2 30 22 - 32 mmol/L   Glucose, Bld 125 (H) 70 - 99 mg/dL   BUN 47 (H) 8 - 23 mg/dL   Creatinine, Ser 1.59 (H) 0.44 - 1.00 mg/dL   Calcium 8.4 (L) 8.9 - 10.3 mg/dL   GFR, Estimated 31 (L) >60 mL/min   Anion gap 12 5 - 15  Brain natriuretic peptide     Status: Abnormal   Collection Time: 01/30/22  7:28 PM  Result Value Ref Range   B Natriuretic Peptide 150.6 (H) 0.0 - 100.0 pg/mL  CBC with Differential     Status: None   Collection Time: 01/30/22  7:28 PM  Result Value Ref Range   WBC 7.6 4.0 - 10.5 K/uL   RBC 3.98 3.87 - 5.11 MIL/uL   Hemoglobin 12.7 12.0 - 15.0 g/dL   HCT 38.2 36.0 - 46.0 %   MCV 96.0 80.0 - 100.0 fL   MCH 31.9 26.0 - 34.0 pg   MCHC 33.2 30.0 - 36.0 g/dL   RDW 13.2 11.5 - 15.5 %   Platelets 180 150 - 400 K/uL   nRBC 0.0 0.0 - 0.2 %   Neutrophils Relative % 70 %   Neutro Abs 5.3 1.7 - 7.7 K/uL   Lymphocytes Relative 18 %   Lymphs Abs 1.4 0.7 - 4.0 K/uL   Monocytes Relative 10 %   Monocytes Absolute 0.8 0.1 - 1.0 K/uL   Eosinophils Relative 1 %   Eosinophils Absolute 0.1 0.0 - 0.5 K/uL   Basophils  Relative 0 %   Basophils Absolute 0.0 0.0 - 0.1 K/uL   Immature Granulocytes 1 %   Abs Immature Granulocytes 0.05 0.00 - 0.07 K/uL  SARS Coronavirus 2 by RT PCR (hospital order, performed in [place] hospital lab) *cepheid single result test* Anterior Nasal Swab     Status: None  Collection Time: 01/30/22  7:28 PM   Specimen: Anterior Nasal Swab  Result Value Ref Range   SARS Coronavirus 2 by RT PCR NEGATIVE NEGATIVE  Magnesium     Status: None   Collection Time: 01/30/22  7:28 PM  Result Value Ref Range   Magnesium 2.2 1.7 - 2.4 mg/dL  Valproic acid level     Status: Abnormal   Collection Time: 01/30/22  7:39 PM  Result Value Ref Range   Valproic Acid Lvl 33 (L) 50.0 - 100.0 ug/mL  Urinalysis, Routine w reflex microscopic Anterior Nasal Swab     Status: Abnormal   Collection Time: 01/30/22  8:55 PM  Result Value Ref Range   Color, Urine YELLOW YELLOW   APPearance HAZY (A) CLEAR   Specific Gravity, Urine 1.018 1.005 - 1.030   pH 5.0 5.0 - 8.0   Glucose, UA NEGATIVE NEGATIVE mg/dL   Hgb urine dipstick NEGATIVE NEGATIVE   Bilirubin Urine NEGATIVE NEGATIVE   Ketones, ur 5 (A) NEGATIVE mg/dL   Protein, ur 30 (A) NEGATIVE mg/dL   Nitrite NEGATIVE NEGATIVE   Leukocytes,Ua TRACE (A) NEGATIVE   RBC / HPF 0-5 0 - 5 RBC/hpf   WBC, UA 21-50 0 - 5 WBC/hpf   Bacteria, UA RARE (A) NONE SEEN   Squamous Epithelial / LPF 6-10 0 - 5   WBC Clumps PRESENT    Mucus PRESENT    Hyaline Casts, UA PRESENT   Comprehensive metabolic panel     Status: Abnormal   Collection Time: 01/31/22  6:03 AM  Result Value Ref Range   Sodium 139 135 - 145 mmol/L   Potassium 3.3 (L) 3.5 - 5.1 mmol/L   Chloride 102 98 - 111 mmol/L   CO2 26 22 - 32 mmol/L   Glucose, Bld 114 (H) 70 - 99 mg/dL   BUN 32 (H) 8 - 23 mg/dL   Creatinine, Ser 0.86 0.44 - 1.00 mg/dL   Calcium 8.9 8.9 - 10.3 mg/dL   Total Protein 7.1 6.5 - 8.1 g/dL   Albumin 3.0 (L) 3.5 - 5.0 g/dL   AST 18 15 - 41 U/L   ALT 13 0 - 44 U/L    Alkaline Phosphatase 62 38 - 126 U/L   Total Bilirubin 1.4 (H) 0.3 - 1.2 mg/dL   GFR, Estimated >60 >60 mL/min   Anion gap 11 5 - 15  TSH     Status: None   Collection Time: 01/31/22  6:03 AM  Result Value Ref Range   TSH 1.306 0.350 - 4.500 uIU/mL  Vitamin B12     Status: None   Collection Time: 01/31/22  6:03 AM  Result Value Ref Range   Vitamin B-12 725 180 - 914 pg/mL  Ammonia     Status: None   Collection Time: 01/31/22  6:03 AM  Result Value Ref Range   Ammonia <10 9 - 35 umol/L  Glucose, capillary     Status: Abnormal   Collection Time: 01/31/22  9:03 AM  Result Value Ref Range   Glucose-Capillary 110 (H) 70 - 99 mg/dL  Glucose, capillary     Status: Abnormal   Collection Time: 01/31/22 11:59 AM  Result Value Ref Range   Glucose-Capillary 109 (H) 70 - 99 mg/dL   Comment 1 Notify RN    Comment 2 Document in Chart     I have Reviewed nursing notes, Vitals, and Lab results since pt's last encounter. Pertinent lab results : see above I have ordered test  including BMP, CBC, Mg I have reviewed the last note from staff over past 24 hours I have discussed pt's care plan and test results with nursing staff, case manager   LOS: 0 days   Dwyane Dee, MD [facility] 01/31/2022, 2:42 PM

## 2022-01-31 NOTE — Progress Notes (Signed)
Received pt from ED. Pt is confused, cannot finish the admission documentation and admission assessment.

## 2022-01-31 NOTE — Progress Notes (Signed)
Breakfast ordered for patient.  RN concerned that patient not alert enough to take PO.  MD notified.  Angie Fava, RN

## 2022-01-31 NOTE — H&P (Signed)
History and Physical    [name] ACZ:[id number] DOB: 10/24/1930 DOA: 01/30/2022  PCP: Binnie Rail, MD  Patient coming from: SNF  Chief Complaint: Shortness of breath  HPI: [name] is a 88 y.o. female with medical history significant of dementia, hypertension, depression, CAD status post CABG, hyperlipidemia, type 2 diabetes, fibromyalgia who presented to the ED with low O2 sats.  EMS said they were told that she had an oxygen saturation of 80% but when they arrived her O2 sat was 92-93% on room air.  EMS placed on 2 L O2 for transport.  Patient's daughter is a Engineer, drilling.  Daughter told ED physician that patient is much more confused than usual.  She was moved to a memory care unit on 10/5.  Daughter concerned that Depakote could be contributing to her confusion, neuro had been weaning Depakote but her facility continued to give her the prior dose.  In the ED, patient's vital signs were stable.  Satting well on room air.  Labs showing no leukocytosis, potassium 3.2, magnesium 2.2, BUN 47, creatinine 1.5 (baseline 0.7-0.9), calcium 8.4, BNP 150, SARS-CoV-2 PCR negative.  UA with negative nitrite, trace leukocytes, and microscopy showing 21-50 WBCs and rare bacteria.  Chest x-ray negative for acute finding. Patient was given Keflex, gabapentin, oral potassium 40 mEq, Seroquel, and 1 L normal saline bolus.  Patient is very confused and not able to give any history.  She is oriented to self only.  Review of Systems:  Review of Systems  Reason unable to perform ROS: AMS.    Past Medical History:  Diagnosis Date   CAD, NATIVE VESSEL 06/09/2008   DIABETES MELLITUS, TYPE II    FIBROMYALGIA    GERD    Gout    HIATAL HERNIA    HYPERLIPIDEMIA    HYPERTENSION    Insomnia    OA (osteoarthritis)    severe, shoulders, hands - inflammatory, ?RA   S/P CABG x 1    S/P inguinal hernia repair    TUBULOVILLOUS ADENOMA, 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   colo 04/2012 - no polyps - no further  screening planned (age >62)   UNSPECIFIED ANEMIA     Past Surgical History:  Procedure Laterality Date   ABDOMINAL HYSTERECTOMY  1981   APPENDECTOMY     CATARACT EXTRACTION, BILATERAL     CORONARY ARTERY BYPASS GRAFT  2010   HERNIA REPAIR     INGUINAL   I & D EXTREMITY Left 12/07/2017   Procedure: IRRIGATION AND DEBRIDEMENT EXTREMITY WITH FOREIGN BODY REMOVAL;  Surgeon: Nicholes Stairs, MD;  Location: [facility];  Service: Orthopedics;  Laterality: Left;   JOINT REPLACEMENT     OVARIAN CYST REMOVAL     REPLACEMENT TOTAL KNEE BILATERAL Bilateral    Rt-1974, Lt -1989   REVERSE SHOULDER ARTHROPLASTY  09/08/2011   Procedure: REVERSE SHOULDER ARTHROPLASTY;  Surgeon: Nita Sells, MD;  Location: [facility];  Service: Orthopedics;  Laterality: Left;  left reverse total shoulder arthroplasty   ROTATOR CUFF REPAIR     Left     reports that she quit smoking about 51 years ago. Her smoking use included cigarettes. She has never used smokeless tobacco. She reports that she does not currently use alcohol after a past usage of about 1.0 standard drink of alcohol per week. She reports that she does not use drugs.  No Known Allergies  Family History  Problem Relation Age of Onset   Cancer Mother  Stomach   Stomach cancer Mother 23   Hypertension Mother    Heart disease Father 39       MI   Diabetes Father    Heart attack Father    Vascular Disease Sister    Stroke Sister    Dementia Sister    Diabetes Sister    Parkinsonism Sister    Stroke Sister    Heart disease Sister    Heart attack Brother    Stroke Daughter    Diabetes Daughter    Hypertension Daughter    Colon cancer Neg Hx     Prior to Admission medications   Medication Sig Start Date End Date Taking? Authorizing Provider  acetaminophen (TYLENOL) 500 MG tablet Take 1,000 mg by mouth 2 (two) times daily.    Yes [provider]  amLODipine (NORVASC) 5 MG tablet TAKE ONE TABLET BY MOUTH TWICE A DAY  09/02/21  Yes Burns, Claudina Lick, MD  aspirin EC 81 MG tablet Take 81 mg by mouth daily.   Yes [provider]  busPIRone (BUSPAR) 15 MG tablet Take 1 tablet (15 mg total) by mouth 2 (two) times daily. 12/12/21  Yes Cameron Sprang, MD  carvedilol (COREG) 6.25 MG tablet TAKE ONE TABLET BY MOUTH TWICE A DAY 09/19/21  Yes Fay Records, MD  cetirizine (ZYRTEC) 10 MG tablet TAKE ONE TABLET BY MOUTH DAILY Patient taking differently: Take 10 mg by mouth at bedtime. 02/22/17  Yes Burns, Claudina Lick, MD  Cholecalciferol (D3-1000) 25 MCG (1000 UT) capsule Take 1,000 Units by mouth daily.   Yes [provider]  diclofenac sodium (VOLTAREN) 1 % GEL Apply 2 g topically 4 (four) times daily as needed (pain). 08/25/18  Yes Burns, Claudina Lick, MD  divalproex (DEPAKOTE) 125 MG DR tablet Take 125-250 mg by mouth as directed. Take 2 tablets (250 mg) BID on 01/31/22 then Take 1 tablet (125 mg) BID on 10/15, then Discontinue on 02/02/22   Yes [provider]  donepezil (ARICEPT) 10 MG tablet TAKE ONE TABLET BY MOUTH DAILY 12/12/21  Yes Cameron Sprang, MD  ferrous sulfate 324 MG TBEC Take 324 mg by mouth daily.   Yes [provider]  gabapentin (NEURONTIN) 300 MG capsule TAKE ONE CAPSULE BY MOUTH EVERY NIGHT AT BEDTIME 09/02/21  Yes Burns, Claudina Lick, MD  MELATONIN PO Take 3 mg by mouth at bedtime.   Yes [provider]  meloxicam (MOBIC) 7.5 MG tablet TAKE ONE TABLET BY MOUTH DAILY 12/26/20  Yes Burns, Claudina Lick, MD  memantine (NAMENDA) 10 MG tablet Take 1 tablet (10 mg total) by mouth 2 (two) times daily. 12/12/21  Yes Cameron Sprang, MD  omeprazole (PRILOSEC) 40 MG capsule TAKE ONE CAPSULE BY MOUTH DAILY 08/19/21  Yes Burns, Claudina Lick, MD  QUEtiapine (SEROQUEL) 50 MG tablet Take 1 tablet (50 mg total) by mouth at bedtime. 12/12/21  Yes Cameron Sprang, MD  ramipril (ALTACE) 10 MG capsule TAKE ONE CAPSULE BY MOUTH TWICE A DAY 08/06/21  Yes Burns, Claudina Lick, MD  DULoxetine (CYMBALTA) 60 MG capsule Take 1  capsule (60 mg total) by mouth daily. Patient not taking: Reported on 01/31/2022 12/12/21   Cameron Sprang, MD    Physical Exam: Vitals:   01/30/22 2304 01/30/22 2330 01/31/22 0100 01/31/22 0200  BP:  124/62 122/81 134/74  Pulse:  65 70 70  Resp:  '16 16 16  '$ Temp: 97.6 F (36.4 C)     TempSrc:  SpO2:  94% 100% 95%  Weight:      Height:        Physical Exam Vitals reviewed.  Constitutional:      General: She is not in acute distress. HENT:     Head: Normocephalic and atraumatic.     Mouth/Throat:     Mouth: Mucous membranes are dry.     Comments: Very dry mucous membranes Eyes:     Pupils: Pupils are equal, round, and reactive to light.  Cardiovascular:     Rate and Rhythm: Normal rate and regular rhythm.     Pulses: Normal pulses.  Pulmonary:     Effort: Pulmonary effort is normal. No respiratory distress.     Breath sounds: Normal breath sounds. No wheezing or rales.  Abdominal:     General: Bowel sounds are normal. There is no distension.     Palpations: Abdomen is soft.     Tenderness: There is no abdominal tenderness.  Musculoskeletal:        General: No swelling or tenderness.     Cervical back: Normal range of motion.  Skin:    General: Skin is warm and dry.  Neurological:     Mental Status: She is alert.     Comments: Oriented to self only No obvious facial droop Moving all extremities on command, no focal weakness     Labs on Admission: I have personally reviewed following labs and imaging studies  CBC: Recent Labs  Lab 01/27/22 1828 01/30/22 1928  WBC 12.9* 7.6  NEUTROABS 9.2* 5.3  HGB 13.7 12.7  HCT 40.1 38.2  MCV 95.2 96.0  PLT 152 622   Basic Metabolic Panel: Recent Labs  Lab 01/27/22 1828 01/30/22 1928  NA 138 139  K 3.1* 3.2*  CL 97* 97*  CO2 28 30  GLUCOSE 140* 125*  BUN 12 47*  CREATININE 0.80 1.59*  CALCIUM 8.9 8.4*  MG  --  2.2   GFR: Estimated Creatinine Clearance: 18.9 mL/min (A) (by C-G formula based on SCr of  1.59 mg/dL (H)). Liver Function Tests: No results for input(s): "AST", "ALT", "ALKPHOS", "BILITOT", "PROT", "ALBUMIN" in the last 168 hours. No results for input(s): "LIPASE", "AMYLASE" in the last 168 hours. No results for input(s): "AMMONIA" in the last 168 hours. Coagulation Profile: No results for input(s): "INR", "PROTIME" in the last 168 hours. Cardiac Enzymes: No results for input(s): "CKTOTAL", "CKMB", "CKMBINDEX", "TROPONINI" in the last 168 hours. BNP (last 3 results) No results for input(s): "PROBNP" in the last 8760 hours. HbA1C: No results for input(s): "HGBA1C" in the last 72 hours. CBG: No results for input(s): "GLUCAP" in the last 168 hours. Lipid Profile: No results for input(s): "CHOL", "HDL", "LDLCALC", "TRIG", "CHOLHDL", "LDLDIRECT" in the last 72 hours. Thyroid Function Tests: No results for input(s): "TSH", "T4TOTAL", "FREET4", "T3FREE", "THYROIDAB" in the last 72 hours. Anemia Panel: No results for input(s): "VITAMINB12", "FOLATE", "FERRITIN", "TIBC", "IRON", "RETICCTPCT" in the last 72 hours. Urine analysis:    Component Value Date/Time   COLORURINE YELLOW 01/30/2022 2055   APPEARANCEUR HAZY (A) 01/30/2022 2055   LABSPEC 1.018 01/30/2022 2055   PHURINE 5.0 01/30/2022 2055   GLUCOSEU NEGATIVE 01/30/2022 2055   GLUCOSEU NEGATIVE 02/27/2021 [place] 01/30/2022 2055   BILIRUBINUR NEGATIVE 01/30/2022 2055   KETONESUR 5 (A) 01/30/2022 2055   PROTEINUR 30 (A) 01/30/2022 2055   UROBILINOGEN 0.2 02/27/2021 1046   NITRITE NEGATIVE 01/30/2022 2055   LEUKOCYTESUR TRACE (A) 01/30/2022 2055  Radiological Exams on Admission: DG Chest Port 1 View  Result Date: 01/30/2022 CLINICAL DATA:  Shortness of breath, hypoxia EXAM: PORTABLE CHEST 1 VIEW COMPARISON:  04/17/2014 FINDINGS: There is poor inspiration. Cardiac size is within normal limits. There is previous coronary bypass surgery. Left hemidiaphragm is elevated. There are no signs of alveolar  pulmonary edema or focal pulmonary consolidation. There is no pleural effusion or pneumothorax. There is arthroplasty in both shoulders. IMPRESSION: Poor inspiration. There are no signs of pulmonary edema or focal pulmonary consolidation. Electronically Signed   By: Elmer Picker M.D.   On: 01/30/2022 19:24    EKG: Independently reviewed.  Sinus rhythm, borderline shortened PR interval.  No significant change compared to prior tracings.  Assessment and Plan  AKI Likely prerenal from dehydration.  She has very dry mucous membranes.  BUN 47, creatinine 1.5 (baseline 0.7-0.9). -Continue gentle IV fluid hydration -Monitor renal function -Avoid nephrotoxic agents/hold home Mobic and ramipril  ?UTI UA with negative nitrite, trace leukocytes, and microscopy showing 21-50 WBCs and rare bacteria.  No fever, leukocytosis, or signs of sepsis. -Continue Keflex at this time -Add on urine culture  Acute metabolic encephalopathy Likely multifactorial from Depakote, AKI/dehydration, and ?UTI.  No focal neurodeficit on exam.  Patient appears very confused. -Stat CT head -Continue treatment for AKI and possible UTI -Check TSH, B12, ammonia -Hold Depakote at this time  Mild hypoxia Oxygen saturation in the low 90s with EMS.  Lungs clear on exam and chest x-ray negative for acute finding.  PE felt to be less likely as patient is not tachycardic and currently satting well on room air.  No clinical signs of DVT on exam. -Continue to monitor, supplemental oxygen as needed  Mild hypokalemia Potassium replacement given.  Magnesium level is normal. -Cardiac monitoring -Continue to monitor and replace potassium if still low  ?Hypocalcemia Calcium 8.4. -Check albumin level  Diet controlled type 2 diabetes A1c 5.8 on 09/05/2021. -CBG checks ACHS  Dementia Mood disorder -Continue home meds except hold Depakote at this time  Hypertension Stable. -Continue amlodipine and Coreg  GERD -Continue  PPI  Chronic pain -Continue gabapentin -Hold Mobic given AKI  CAD status post CABG Stable.  EKG without acute ischemic changes and patient is not endorsing chest pain. -Continue beta-blocker -Hold aspirin until CT head is done  DVT prophylaxis: SCDs.  No chemical DVT prophylaxis until CT head is done. Code Status: Full code per review of note from palliative care from yesterday. Family Communication: No family available at this time. Level of care: Telemetry bed Admission status: It is my clinical opinion that referral for OBSERVATION is reasonable and necessary in this patient based on the above information provided. The aforementioned taken together are felt to place the patient at high risk for further clinical deterioration. However, it is anticipated that the patient may be medically stable for discharge from the hospital within 24 to 48 hours.   Shela Leff MD [facility]  If 7PM-7AM, please contact night-coverage [email or web address]  01/31/2022, 2:47 AM

## 2022-02-01 DIAGNOSIS — N3000 Acute cystitis without hematuria: Secondary | ICD-10-CM | POA: Diagnosis not present

## 2022-02-01 DIAGNOSIS — F0390 Unspecified dementia without behavioral disturbance: Secondary | ICD-10-CM | POA: Diagnosis not present

## 2022-02-01 DIAGNOSIS — G9341 Metabolic encephalopathy: Secondary | ICD-10-CM | POA: Diagnosis not present

## 2022-02-01 DIAGNOSIS — N179 Acute kidney failure, unspecified: Secondary | ICD-10-CM | POA: Diagnosis not present

## 2022-02-01 LAB — BASIC METABOLIC PANEL
Anion gap: 7 (ref 5–15)
BUN: 21 mg/dL (ref 8–23)
CO2: 29 mmol/L (ref 22–32)
Calcium: 8.5 mg/dL — ABNORMAL LOW (ref 8.9–10.3)
Chloride: 106 mmol/L (ref 98–111)
Creatinine, Ser: 0.53 mg/dL (ref 0.44–1.00)
GFR, Estimated: >60 mL/min (ref >60)
Glucose, Bld: 136 mg/dL — ABNORMAL HIGH (ref 70–99)
Potassium: 3 mmol/L — ABNORMAL LOW (ref 3.5–5.1)
Sodium: 142 mmol/L (ref 135–145)

## 2022-02-01 LAB — MAGNESIUM: Magnesium: 2 mg/dL (ref 1.7–2.4)

## 2022-02-01 LAB — CBC WITH DIFFERENTIAL/PLATELET
Abs Immature Granulocytes: 0.04 10*3/uL (ref 0.00–0.07)
Basophils Absolute: 0 10*3/uL (ref 0.0–0.1)
Basophils Relative: 0 %
Eosinophils Absolute: 0.1 10*3/uL (ref 0.0–0.5)
Eosinophils Relative: 2 %
HCT: 37.1 % (ref 36.0–46.0)
Hemoglobin: 11.9 g/dL — ABNORMAL LOW (ref 12.0–15.0)
Immature Granulocytes: 1 %
Lymphocytes Relative: 24 %
Lymphs Abs: 1.7 10*3/uL (ref 0.7–4.0)
MCH: 31.9 pg (ref 26.0–34.0)
MCHC: 32.1 g/dL (ref 30.0–36.0)
MCV: 99.5 fL (ref 80.0–100.0)
Monocytes Absolute: 0.8 10*3/uL (ref 0.1–1.0)
Monocytes Relative: 11 %
Neutro Abs: 4.4 10*3/uL (ref 1.7–7.7)
Neutrophils Relative %: 62 %
Platelets: 151 10*3/uL (ref 150–400)
RBC: 3.73 MIL/uL — ABNORMAL LOW (ref 3.87–5.11)
RDW: 13.2 % (ref 11.5–15.5)
WBC: 7 10*3/uL (ref 4.0–10.5)
nRBC: 0 % (ref 0.0–0.2)

## 2022-02-01 LAB — URINE CULTURE: Culture: 60000 — AB

## 2022-02-01 LAB — GLUCOSE, CAPILLARY
Glucose-Capillary: 121 mg/dL — ABNORMAL HIGH (ref 70–99)
Glucose-Capillary: 122 mg/dL — ABNORMAL HIGH (ref 70–99)
Glucose-Capillary: 113 mg/dL — ABNORMAL HIGH (ref 70–99)
Glucose-Capillary: 134 mg/dL — ABNORMAL HIGH (ref 70–99)

## 2022-02-01 MED ORDER — POTASSIUM CHLORIDE CRYS ER 20 MEQ PO TBCR
40.0000 meq | EXTENDED_RELEASE_TABLET | ORAL | Status: AC
  Administered 2022-02-01 (×2): 40 meq via ORAL
  Filled 2022-02-01 (×2): qty 2

## 2022-02-01 MED ORDER — POTASSIUM CHLORIDE 20 MEQ PO PACK
40.0000 meq | PACK | ORAL | Status: DC
  Filled 2022-02-01: qty 2

## 2022-02-01 NOTE — TOC Progression Note (Signed)
Transition of Care [facility]) - Progression Note    Patient Details  Name: [name] MRN: [id number] Date of Birth: 21-Dec-1930  [facility] CM/SW Contact  Henrietta Dine, RN Phone Number: 02/01/2022, 3:58 PM  Clinical Narrative:    Pt from [facility] (memory care unit); spoke with Rachida [phone]); she confirms residency and says the pt is a care level 2; TOC consult for SNF; awaiting PT eval prior to completing FL2.        Expected Discharge Plan and Services                                                 Social Determinants of Health (SDOH) Interventions    Readmission Risk Interventions     No data to display

## 2022-02-01 NOTE — Evaluation (Signed)
Physical Therapy Evaluation Patient Details Name: [name] MRN: [id number] DOB: 06/20/1930 Today's Date: 02/01/2022  History of Present Illness  88 yo female with PMH dementia, hypertension, depression, CAD status post CABG, hyperlipidemia, type 2 diabetes, chronic pain, peripheral neuropathy, fibromyalgia who presented to the ED with altered mentation and admitted 01/30/22 for acute kidney injury and Acute cystitis  Clinical Impression  Pt admitted with above diagnosis.  Pt currently with functional limitations due to the deficits listed below (see PT Problem List). Pt will benefit from skilled PT to increase their independence and safety with mobility to allow discharge to the venue listed below.  Pt assisted with sitting EOB and pt felt unable to stand due to fatigue and back and right elbow pain at this time.  Pt poor historian however per chart review, from ALF and ambulatory with RW.  Pt may need SNF upon d/c if ALF unable to provide current level of assist.        Recommendations for follow up therapy are one component of a multi-disciplinary discharge planning process, led by the attending physician.  Recommendations may be updated based on patient status, additional functional criteria and insurance authorization.  Follow Up Recommendations Skilled nursing-short term rehab (<3 hours/day) Can patient physically be transported by private vehicle: No    Assistance Recommended at Discharge Frequent or constant Supervision/Assistance  Patient can return home with the following  A lot of help with walking and/or transfers;A lot of help with bathing/dressing/bathroom    Equipment Recommendations None recommended by PT  Recommendations for Other Services       Functional Status Assessment Patient has had a recent decline in their functional status and demonstrates the ability to make significant improvements in function in a reasonable and predictable amount of time.      Precautions / Restrictions Precautions Precautions: Fall      Mobility  Bed Mobility Overal bed mobility: Needs Assistance Bed Mobility: Supine to Sit, Sit to Supine     Supine to sit: Max assist, HOB elevated Sit to supine: Max assist, +2 for physical assistance   General bed mobility comments: multimodal cues for technique, assist to bring LEs to EOB and trunk upright; more assist upon return to supine due to fatigue and pain    Transfers Overall transfer level: Needs assistance                 General transfer comment: pt unable to perform despite attempt with RW, pt reports pain and fatigue limiting    Ambulation/Gait                  Stairs            Wheelchair Mobility    Modified Rankin (Stroke Patients Only)       Balance                                             Pertinent Vitals/Pain Pain Assessment Pain Assessment: Faces Faces Pain Scale: Hurts even more Pain Location: right elbow and back Pain Descriptors / Indicators: Sore Pain Intervention(s): Repositioned, Monitored during session (Rn is aware)    Home Living Family/patient expects to be discharged to:: Assisted living                 Home Equipment: Conservation officer, nature (2 wheels) Additional Comments: From [employer] per chart  Prior Function Prior Level of Function : Needs assist             Mobility Comments: from assisted living facility, pt poor historian, per chart review, ambulatory with RW       Hand Dominance        Extremity/Trunk Assessment   Upper Extremity Assessment Upper Extremity Assessment: RUE deficits/detail RUE Deficits / Details: R arthritis in hand per RN (per daughter), pt with mitten on right hand and c/o elbow pain RUE: Unable to fully assess due to pain    Lower Extremity Assessment Lower Extremity Assessment: Generalized weakness    Cervical / Trunk Assessment Cervical / Trunk Assessment: Kyphotic   Communication   Communication: No difficulties  Cognition Arousal/Alertness: Awake/alert Behavior During Therapy: WFL for tasks assessed/performed Overall Cognitive Status: History of cognitive impairments - at baseline                                          General Comments      Exercises     Assessment/Plan    PT Assessment Patient needs continued PT services  PT Problem List Decreased strength;Decreased balance;Decreased mobility;Decreased activity tolerance;Decreased knowledge of use of DME       PT Treatment Interventions DME instruction;Gait training;Balance training;Therapeutic exercise;Functional mobility training;Therapeutic activities;Patient/family education;Wheelchair mobility training    PT Goals (Current goals can be found in the Care Plan section)  Acute Rehab PT Goals PT Goal Formulation: Patient unable to participate in goal setting Time For Goal Achievement: 02/15/22 Potential to Achieve Goals: Fair    Frequency Min 2X/week     Co-evaluation               AM-PAC PT "6 Clicks" Mobility  Outcome Measure Help needed turning from your back to your side while in a flat bed without using bedrails?: A Lot Help needed moving from lying on your back to sitting on the side of a flat bed without using bedrails?: A Lot Help needed moving to and from a bed to a chair (including a wheelchair)?: Total Help needed standing up from a chair using your arms (e.g., wheelchair or bedside chair)?: Total Help needed to walk in hospital room?: Total Help needed climbing 3-5 steps with a railing? : Total 6 Click Score: 8    End of Session Equipment Utilized During Treatment: Gait belt Activity Tolerance: Patient limited by fatigue;Patient limited by pain Patient left: in bed;with call bell/phone within reach;with bed alarm set Nurse Communication: Mobility status PT Visit Diagnosis: Difficulty in walking, not elsewhere classified (R26.2)     Time: 7062-3762 PT Time Calculation (min) (ACUTE ONLY): 17 min   Charges:   PT Evaluation $PT Eval Low Complexity: 1 Low         Kati PT, DPT Physical Therapist Acute Rehabilitation Services Preferred contact method: Secure Chat Weekend Pager Only: [phone] Office: [place] 02/01/2022, 1:46 PM

## 2022-02-01 NOTE — Progress Notes (Signed)
Progress Note    [name]   FFM:[id number]  DOB: March 07, 1931  DOA: 01/30/2022     1 PCP: Binnie Rail, MD  Initial CC: confusion   Hospital Course: [name] is an 88 yo female with PMH dementia, hypertension, depression, CAD status post CABG, hyperlipidemia, type 2 diabetes, fibromyalgia who presented to the ED with altered mentation.   She was reported to have had hypoxia prior to EMS arrival however was found to have oxygen saturations low 90s.  She was placed on 2 L and brought to the ER for further work-up. She had recently been transferred to memory care unit on 01/22/2022.  She had been weaned off of Depakote by psychiatry outpatient however was placed back on original dosing upon transfer to memory care. Notable lab findings on work-up include BUN 47, creatinine 1.59 up from normal baseline's. Initial WBC 12.9. UA showed trace LE, negative nitrite, 21-50 WBC, hyaline casts, rare bacteria. She was started on Keflex. IV fluids also initiated on admission.  Interval History:  No events overnight.  About the same level of confusion/mentation as yesterday. Sore right wrist this morning.   Assessment and Plan:  AKI - resolved  -Suspected due to prerenal from poor oral intake - baseline creatinine ~ 1 - patient presents with increase in creat >0.3 mg/dL above baseline, creat increase >1.5x baseline presumed to have occurred within past 7 days PTA - creat 1.59, BUN 47 on admission - continue IVF; renal fxn improving    Acute cystitis -Encephalopathy possibly accounting as symptom as patient unable to provide much collateral information -UA notable for trace LE, negative nitrite, rare bacteria, 21-50 WBC - continue keflex to complete course  - follow up cultures    Acute metabolic encephalopathy Likely multifactorial from AKI/dehydration, and ?UTI.  No focal neurodeficit on exam.  Also probable contribution from underlying dementia -CT head unremarkable for  acute findings, shows underlying cerebral atrophy - TSH normal, B12 725, NH3 negative - depakote level 33 (low) ---was being tapered off prior to tx to memory care   Hypokalemia -Replete as needed   Diet controlled type 2 diabetes A1c 5.8 on 09/05/2021. -CBG checks ACHS   Dementia Mood disorder -Discussed with daughter, prefers Depakote to be tapered back off since was restarted upon transfer to memory care -I discussed with psychiatry and taper ordered per recommendations for depakote   Hypertension -Continue amlodipine and Coreg   GERD -Continue PPI   Chronic pain -Continue gabapentin   CAD status post CABG Stable.  EKG without acute ischemic changes and patient is not endorsing chest pain. -Continue beta-blocker and asa   Old records reviewed in assessment of this patient  Antimicrobials: Keflex 10/13 >> current   DVT prophylaxis:  SCDs Start: 01/31/22 0346   Code Status:   Code Status: Full Code  Mobility Assessment (last 72 hours)     Mobility Assessment     Row Name 02/01/22 1340 02/01/22 1053 02/01/22 0921 01/31/22 1935 01/31/22 1700   Does patient have an order for bedrest or is patient medically unstable -- No - Continue assessment No - Continue assessment No - Continue assessment --   What is the highest level of mobility based on the progressive mobility assessment? Level 1 (Bedfast) - Unable to balance while sitting on edge of bed Level 1 (Bedfast) - Unable to balance while sitting on edge of bed Level 1 (Bedfast) - Unable to balance while sitting on edge of bed Level 1 (Bedfast) - Unable  to balance while sitting on edge of bed Level 1 (Bedfast) - Unable to balance while sitting on edge of bed   Is the above level different from baseline mobility prior to current illness? -- Yes - Recommend PT order Yes - Recommend PT order Yes - Recommend PT order Yes - Recommend PT order    Row Name 01/31/22 0453           Does patient have an order for bedrest or  is patient medically unstable No - Continue assessment       What is the highest level of mobility based on the progressive mobility assessment? --       Is the above level different from baseline mobility prior to current illness? --                Barriers to discharge: none Disposition Plan:  SNF Status is: Inpt  Objective: Blood pressure (!) 154/60, pulse 74, temperature 98.4 F (36.9 C), temperature source Oral, resp. rate 18, height '4\' 11"'$  (1.499 m), weight 60.3 kg, SpO2 95 %.  Examination:  Physical Exam Constitutional:      Comments: Grossly confused   HENT:     Head: Normocephalic and atraumatic.     Mouth/Throat:     Mouth: Mucous membranes are moist.  Eyes:     Extraocular Movements: Extraocular movements intact.  Cardiovascular:     Rate and Rhythm: Normal rate and regular rhythm.  Pulmonary:     Effort: Pulmonary effort is normal.     Breath sounds: Normal breath sounds.  Abdominal:     General: Bowel sounds are normal. There is no distension.     Palpations: Abdomen is soft.     Tenderness: There is no abdominal tenderness.  Musculoskeletal:        General: Normal range of motion.     Cervical back: Normal range of motion.  Skin:    General: Skin is warm.  Neurological:     Mental Status: She is alert. She is disoriented.      Consultants:    Procedures:    Data Reviewed: Results for orders placed or performed during the hospital encounter of 01/30/22 (from the past 24 hour(s))  Glucose, capillary     Status: Abnormal   Collection Time: 01/31/22  5:10 PM  Result Value Ref Range   Glucose-Capillary 170 (H) 70 - 99 mg/dL  MRSA Next Gen by PCR, Nasal     Status: None   Collection Time: 01/31/22  6:56 PM   Specimen: Nasal Mucosa; Nasal Swab  Result Value Ref Range   MRSA by PCR Next Gen NOT DETECTED NOT DETECTED  Glucose, capillary     Status: Abnormal   Collection Time: 01/31/22  8:21 PM  Result Value Ref Range   Glucose-Capillary 109 (H)  70 - 99 mg/dL  Basic metabolic panel     Status: Abnormal   Collection Time: 02/01/22  5:03 AM  Result Value Ref Range   Sodium 142 135 - 145 mmol/L   Potassium 3.0 (L) 3.5 - 5.1 mmol/L   Chloride 106 98 - 111 mmol/L   CO2 29 22 - 32 mmol/L   Glucose, Bld 136 (H) 70 - 99 mg/dL   BUN 21 8 - 23 mg/dL   Creatinine, Ser 0.53 0.44 - 1.00 mg/dL   Calcium 8.5 (L) 8.9 - 10.3 mg/dL   GFR, Estimated >60 >60 mL/min   Anion gap 7 5 - 15  CBC with Differential/Platelet  Status: Abnormal   Collection Time: 02/01/22  5:03 AM  Result Value Ref Range   WBC 7.0 4.0 - 10.5 K/uL   RBC 3.73 (L) 3.87 - 5.11 MIL/uL   Hemoglobin 11.9 (L) 12.0 - 15.0 g/dL   HCT 37.1 36.0 - 46.0 %   MCV 99.5 80.0 - 100.0 fL   MCH 31.9 26.0 - 34.0 pg   MCHC 32.1 30.0 - 36.0 g/dL   RDW 13.2 11.5 - 15.5 %   Platelets 151 150 - 400 K/uL   nRBC 0.0 0.0 - 0.2 %   Neutrophils Relative % 62 %   Neutro Abs 4.4 1.7 - 7.7 K/uL   Lymphocytes Relative 24 %   Lymphs Abs 1.7 0.7 - 4.0 K/uL   Monocytes Relative 11 %   Monocytes Absolute 0.8 0.1 - 1.0 K/uL   Eosinophils Relative 2 %   Eosinophils Absolute 0.1 0.0 - 0.5 K/uL   Basophils Relative 0 %   Basophils Absolute 0.0 0.0 - 0.1 K/uL   Immature Granulocytes 1 %   Abs Immature Granulocytes 0.04 0.00 - 0.07 K/uL  Magnesium     Status: None   Collection Time: 02/01/22  5:03 AM  Result Value Ref Range   Magnesium 2.0 1.7 - 2.4 mg/dL  Glucose, capillary     Status: Abnormal   Collection Time: 02/01/22  8:04 AM  Result Value Ref Range   Glucose-Capillary 121 (H) 70 - 99 mg/dL  Glucose, capillary     Status: Abnormal   Collection Time: 02/01/22 11:41 AM  Result Value Ref Range   Glucose-Capillary 122 (H) 70 - 99 mg/dL    I have Reviewed nursing notes, Vitals, and Lab results since pt's last encounter. Pertinent lab results : see above I have ordered test including BMP, CBC, Mg I have reviewed the last note from staff over past 24 hours I have discussed pt's care plan and  test results with nursing staff, case manager   LOS: 1 day   Dwyane Dee, MD [facility] 02/01/2022, 3:29 PM

## 2022-02-01 NOTE — Progress Notes (Signed)
Attempted to feed patient breakfast and pills crushed in applesauce, however RN concerned that patient too drowsy to swallow safely.  Will attempt again later.  Angie Fava, RN

## 2022-02-01 NOTE — Progress Notes (Signed)
CCMD called pt has 5 beats running V-tach, 12 lead ECG performed. Pt denied chest pain, discomforted. MD notified. RN will continued monitor pt.

## 2022-02-01 NOTE — Progress Notes (Deleted)
Pt wake up, RN crush pills mixed with apple sauce, pt only take one spoon mixed pills and refused take the left pills.

## 2022-02-01 NOTE — Progress Notes (Signed)
Pt wake up, RN crush medications and mixed with apple sauce, pt take one spoon mixed medications and refused to take rest. RN educated the pt, and pt take rest of medications. RN looked the PTA medication, the divalproex dose is taper down, medication given. RN will continue monitor pt's mental status.

## 2022-02-02 DIAGNOSIS — G9341 Metabolic encephalopathy: Secondary | ICD-10-CM | POA: Diagnosis not present

## 2022-02-02 DIAGNOSIS — N3000 Acute cystitis without hematuria: Secondary | ICD-10-CM | POA: Diagnosis not present

## 2022-02-02 DIAGNOSIS — M1990 Unspecified osteoarthritis, unspecified site: Secondary | ICD-10-CM

## 2022-02-02 DIAGNOSIS — N179 Acute kidney failure, unspecified: Secondary | ICD-10-CM | POA: Diagnosis not present

## 2022-02-02 DIAGNOSIS — F0390 Unspecified dementia without behavioral disturbance: Secondary | ICD-10-CM | POA: Diagnosis not present

## 2022-02-02 DIAGNOSIS — M159 Polyosteoarthritis, unspecified: Secondary | ICD-10-CM

## 2022-02-02 LAB — CBC WITH DIFFERENTIAL/PLATELET
Abs Immature Granulocytes: 0.07 10*3/uL (ref 0.00–0.07)
Basophils Absolute: 0 10*3/uL (ref 0.0–0.1)
Basophils Relative: 0 %
Eosinophils Absolute: 0.2 10*3/uL (ref 0.0–0.5)
Eosinophils Relative: 2 %
HCT: 35 % — ABNORMAL LOW (ref 36.0–46.0)
Hemoglobin: 11.1 g/dL — ABNORMAL LOW (ref 12.0–15.0)
Immature Granulocytes: 1 %
Lymphocytes Relative: 25 %
Lymphs Abs: 1.8 10*3/uL (ref 0.7–4.0)
MCH: 31.4 pg (ref 26.0–34.0)
MCHC: 31.7 g/dL (ref 30.0–36.0)
MCV: 98.9 fL (ref 80.0–100.0)
Monocytes Absolute: 0.7 10*3/uL (ref 0.1–1.0)
Monocytes Relative: 10 %
Neutro Abs: 4.4 10*3/uL (ref 1.7–7.7)
Neutrophils Relative %: 62 %
Platelets: 158 10*3/uL (ref 150–400)
RBC: 3.54 MIL/uL — ABNORMAL LOW (ref 3.87–5.11)
RDW: 13.1 % (ref 11.5–15.5)
WBC: 7.2 10*3/uL (ref 4.0–10.5)
nRBC: 0 % (ref 0.0–0.2)

## 2022-02-02 LAB — GLUCOSE, CAPILLARY
Glucose-Capillary: 108 mg/dL — ABNORMAL HIGH (ref 70–99)
Glucose-Capillary: 175 mg/dL — ABNORMAL HIGH (ref 70–99)
Glucose-Capillary: 124 mg/dL — ABNORMAL HIGH (ref 70–99)

## 2022-02-02 LAB — BASIC METABOLIC PANEL
Anion gap: 7 (ref 5–15)
BUN: 11 mg/dL (ref 8–23)
CO2: 27 mmol/L (ref 22–32)
Calcium: 7.9 mg/dL — ABNORMAL LOW (ref 8.9–10.3)
Chloride: 107 mmol/L (ref 98–111)
Creatinine, Ser: 0.55 mg/dL (ref 0.44–1.00)
GFR, Estimated: >60 mL/min (ref >60)
Glucose, Bld: 100 mg/dL — ABNORMAL HIGH (ref 70–99)
Potassium: 3.3 mmol/L — ABNORMAL LOW (ref 3.5–5.1)
Sodium: 141 mmol/L (ref 135–145)

## 2022-02-02 LAB — MAGNESIUM: Magnesium: 1.4 mg/dL — ABNORMAL LOW (ref 1.7–2.4)

## 2022-02-02 MED ORDER — ACETAMINOPHEN 500 MG PO TABS
1000.0000 mg | ORAL_TABLET | Freq: Three times a day (TID) | ORAL | Status: DC
  Administered 2022-02-02 – 2022-02-04 (×4): 1000 mg via ORAL
  Filled 2022-02-02 (×4): qty 2

## 2022-02-02 MED ORDER — POTASSIUM CHLORIDE CRYS ER 20 MEQ PO TBCR
40.0000 meq | EXTENDED_RELEASE_TABLET | Freq: Once | ORAL | Status: AC
  Administered 2022-02-02: 40 meq via ORAL
  Filled 2022-02-02: qty 2

## 2022-02-02 MED ORDER — MAGNESIUM SULFATE 4 GM/100ML IV SOLN
4.0000 g | Freq: Once | INTRAVENOUS | Status: AC
  Administered 2022-02-02: 4 g via INTRAVENOUS
  Filled 2022-02-02: qty 100

## 2022-02-02 MED ORDER — AMLODIPINE BESYLATE 10 MG PO TABS
10.0000 mg | ORAL_TABLET | Freq: Every day | ORAL | Status: DC
  Administered 2022-02-02 – 2022-02-04 (×3): 10 mg via ORAL
  Filled 2022-02-02 (×3): qty 1

## 2022-02-02 MED ORDER — HALOPERIDOL LACTATE 5 MG/ML IJ SOLN: 5.0000 mg | Freq: Once | INTRAMUSCULAR | Status: DC

## 2022-02-02 MED ORDER — HALOPERIDOL LACTATE 5 MG/ML IJ SOLN: 2.0000 mg | Freq: Four times a day (QID) | INTRAMUSCULAR | Status: DC | PRN

## 2022-02-02 MED ORDER — KETOROLAC TROMETHAMINE 15 MG/ML IJ SOLN: 15.0000 mg | Freq: Once | INTRAMUSCULAR | Status: DC

## 2022-02-02 NOTE — Progress Notes (Signed)
Progress Note    [name]   [id number]:[id number]  DOB: 1930/09/14  DOA: 01/30/2022     2 PCP: Binnie Rail, MD  Initial CC: confusion   Hospital Course: [name] is an 87 yo female with PMH dementia, hypertension, depression, CAD status post CABG, hyperlipidemia, type 2 diabetes, fibromyalgia who presented to the ED with altered mentation.   She was reported to have had hypoxia prior to EMS arrival however was found to have oxygen saturations low 90s.  She was placed on 2 L and brought to the ER for further work-up. She had recently been transferred to memory care unit on 01/22/2022.  She had been weaned off of Depakote by psychiatry outpatient however was placed back on original dosing upon transfer to memory care. Notable lab findings on work-up include BUN 47, creatinine 1.59 up from normal baseline's. Initial WBC 12.9. UA showed trace LE, negative nitrite, 21-50 WBC, hyaline casts, rare bacteria. She was started on Keflex. IV fluids also initiated on admission.  Interval History:  No events overnight.  Remains pleasantly demented. Still some right wrist pain today.   Assessment and Plan:  AKI - resolved  -Suspected due to prerenal from poor oral intake - baseline creatinine ~ 1 - patient presents with increase in creat >0.3 mg/dL above baseline, creat increase >1.5x baseline presumed to have occurred within past 7 days PTA - creat 1.59, BUN 47 on admission -Creatinine has improved and normalized with fluids.  Okay to discontinue   Acute cystitis - treated  -Encephalopathy possibly accounting as symptom as patient unable to provide much collateral information -UA notable for trace LE, negative nitrite, rare bacteria, 21-50 WBC - continue keflex to complete course x 5 days total - follow up cultures; equivocal. Finish abx course.    Acute metabolic encephalopathy Likely multifactorial from dementia, AKI/dehydration, and ?UTI.  No focal neurodeficit on exam.   Moreso suspect contribution from underlying dementia -CT head unremarkable for acute findings, shows underlying cerebral atrophy - TSH normal, B12 725, NH3 negative - depakote level 33 (low) ---was being tapered off prior to tx to memory care  Dementia Mood disorder -Discussed with daughter, prefers Depakote to be tapered back off since was restarted upon transfer to memory care -I discussed with psychiatry and taper ordered per recommendations for depakote - continue depakote taper at discharge  Hypokalemia -Replete as needed  Osteoarthritis - Continue Tylenol and Mobic - One-time dose of Toradol ordered for today   Diet controlled type 2 diabetes A1c 5.8 on 09/05/2021 - glucose stable, d/c CBGs   Hypertension -Continue amlodipine and Coreg   GERD -Continue PPI   Chronic pain -Continue gabapentin   CAD status post CABG Stable.  EKG without acute ischemic changes and patient is not endorsing chest pain. -Continue beta-blocker and asa   Old records reviewed in assessment of this patient  Antimicrobials: Keflex 10/13 >> current   DVT prophylaxis:  SCDs Start: 01/31/22 0346   Code Status:   Code Status: Full Code  Mobility Assessment (last 72 hours)     Mobility Assessment     Row Name 02/02/22 1056 02/02/22 0930 02/01/22 2320 02/01/22 2000 02/01/22 1340   Does patient have an order for bedrest or is patient medically unstable -- No - Continue assessment No - Continue assessment No - Continue assessment --   What is the highest level of mobility based on the progressive mobility assessment? Level 2 (Chairfast) - Balance while sitting on edge of bed and  cannot stand Level 2 (Chairfast) - Balance while sitting on edge of bed and cannot stand Level 1 (Bedfast) - Unable to balance while sitting on edge of bed Level 1 (Bedfast) - Unable to balance while sitting on edge of bed Level 1 (Bedfast) - Unable to balance while sitting on edge of bed   Is the above level different  from baseline mobility prior to current illness? -- Yes - Recommend PT order Yes - Recommend PT order Yes - Recommend PT order --    Row Name 02/01/22 1053 02/01/22 0921 01/31/22 1935 01/31/22 1700 01/31/22 0453   Does patient have an order for bedrest or is patient medically unstable No - Continue assessment No - Continue assessment No - Continue assessment -- No - Continue assessment   What is the highest level of mobility based on the progressive mobility assessment? Level 1 (Bedfast) - Unable to balance while sitting on edge of bed Level 1 (Bedfast) - Unable to balance while sitting on edge of bed Level 1 (Bedfast) - Unable to balance while sitting on edge of bed Level 1 (Bedfast) - Unable to balance while sitting on edge of bed --   Is the above level different from baseline mobility prior to current illness? Yes - Recommend PT order Yes - Recommend PT order Yes - Recommend PT order Yes - Recommend PT order --            Barriers to discharge: none Disposition Plan:  SNF Status is: Inpt  Objective: Blood pressure (!) 154/74, pulse 68, temperature 98.6 F (37 C), temperature source Oral, resp. rate 20, height '4\' 11"'$  (1.499 m), weight 60.3 kg, SpO2 96 %.  Examination:  Physical Exam Constitutional:      Comments: Grossly but pleasantly confused   HENT:     Head: Normocephalic and atraumatic.     Mouth/Throat:     Mouth: Mucous membranes are moist.  Eyes:     Extraocular Movements: Extraocular movements intact.  Cardiovascular:     Rate and Rhythm: Normal rate and regular rhythm.  Pulmonary:     Effort: Pulmonary effort is normal.     Breath sounds: Normal breath sounds.  Abdominal:     General: Bowel sounds are normal. There is no distension.     Palpations: Abdomen is soft.     Tenderness: There is no abdominal tenderness.  Musculoskeletal:        General: Normal range of motion.     Cervical back: Normal range of motion.  Skin:    General: Skin is warm.  Neurological:      Mental Status: She is alert. She is disoriented.      Consultants:    Procedures:    Data Reviewed: Results for orders placed or performed during the hospital encounter of 01/30/22 (from the past 24 hour(s))  Glucose, capillary     Status: Abnormal   Collection Time: 02/01/22  5:12 PM  Result Value Ref Range   Glucose-Capillary 113 (H) 70 - 99 mg/dL  Glucose, capillary     Status: Abnormal   Collection Time: 02/01/22  8:57 PM  Result Value Ref Range   Glucose-Capillary 134 (H) 70 - 99 mg/dL  CBC with Differential/Platelet     Status: Abnormal   Collection Time: 02/02/22  4:09 AM  Result Value Ref Range   WBC 7.2 4.0 - 10.5 K/uL   RBC 3.54 (L) 3.87 - 5.11 MIL/uL   Hemoglobin 11.1 (L) 12.0 - 15.0 g/dL  HCT 35.0 (L) 36.0 - 46.0 %   MCV 98.9 80.0 - 100.0 fL   MCH 31.4 26.0 - 34.0 pg   MCHC 31.7 30.0 - 36.0 g/dL   RDW 13.1 11.5 - 15.5 %   Platelets 158 150 - 400 K/uL   nRBC 0.0 0.0 - 0.2 %   Neutrophils Relative % 62 %   Neutro Abs 4.4 1.7 - 7.7 K/uL   Lymphocytes Relative 25 %   Lymphs Abs 1.8 0.7 - 4.0 K/uL   Monocytes Relative 10 %   Monocytes Absolute 0.7 0.1 - 1.0 K/uL   Eosinophils Relative 2 %   Eosinophils Absolute 0.2 0.0 - 0.5 K/uL   Basophils Relative 0 %   Basophils Absolute 0.0 0.0 - 0.1 K/uL   Immature Granulocytes 1 %   Abs Immature Granulocytes 0.07 0.00 - 0.07 K/uL  Basic metabolic panel     Status: Abnormal   Collection Time: 02/02/22  5:48 AM  Result Value Ref Range   Sodium 141 135 - 145 mmol/L   Potassium 3.3 (L) 3.5 - 5.1 mmol/L   Chloride 107 98 - 111 mmol/L   CO2 27 22 - 32 mmol/L   Glucose, Bld 100 (H) 70 - 99 mg/dL   BUN 11 8 - 23 mg/dL   Creatinine, Ser 0.55 0.44 - 1.00 mg/dL   Calcium 7.9 (L) 8.9 - 10.3 mg/dL   GFR, Estimated >60 >60 mL/min   Anion gap 7 5 - 15  Magnesium     Status: Abnormal   Collection Time: 02/02/22  5:48 AM  Result Value Ref Range   Magnesium 1.4 (L) 1.7 - 2.4 mg/dL  Glucose, capillary     Status:  Abnormal   Collection Time: 02/02/22  7:46 AM  Result Value Ref Range   Glucose-Capillary 108 (H) 70 - 99 mg/dL  Glucose, capillary     Status: Abnormal   Collection Time: 02/02/22 11:58 AM  Result Value Ref Range   Glucose-Capillary 175 (H) 70 - 99 mg/dL    I have Reviewed nursing notes, Vitals, and Lab results since pt's last encounter. Pertinent lab results : see above I have ordered test including BMP, CBC, Mg I have reviewed the last note from staff over past 24 hours I have discussed pt's care plan and test results with nursing staff, case manager   LOS: 2 days   Dwyane Dee, MD [facility] 02/02/2022, 3:24 PM

## 2022-02-02 NOTE — NC FL2 (Signed)
[place] LEVEL OF CARE SCREENING TOOL     IDENTIFICATION  Patient Name: [name] Birthdate: 03-14-1931 Sex: female Admission Date (Current Location): 01/30/2022  [facility] and [place] Number:  Herbalist and Address:  [facility],  [place], [place]      Provider Number: [id number]  Attending Physician Name and Address:  Dwyane Dee, MD  Relative Name and Phone Number:  [place] Spanish [phone]    Current Level of Care: Hospital Recommended Level of Care: [place] Prior Approval Number:    Date Approved/Denied: 02/02/22 PASRR Number: [id number]  Discharge Plan: SNF    Current Diagnoses: Patient Active Problem List   Diagnosis Date Noted   AKI (acute kidney injury) ([place]) 01/31/2022   UTI (urinary tract infection) 49/44/9675   Acute metabolic encephalopathy 91/63/8466   Hypoxia 01/31/2022   Hypokalemia 01/31/2022   Type 2 diabetes mellitus ([place]) 01/31/2022   Acute respiratory failure with hypoxia ([place]) 01/30/2022   Dehydration 01/30/2022   Drug reaction 01/30/2022   Encounter for therapeutic drug level monitoring 01/13/2022   Encounter for testing for latent tuberculosis 01/13/2022   Thrombocytopenia ([place]) 01/08/2022   Delirium with dementia 01/08/2022   Palliative care encounter 09/21/2021   Urinary frequency 09/18/2020   Aortic atherosclerosis ([place]) 08/07/2020   Dementia ([place]) 07/27/2017   Chronic RUQ pain 05/28/2016   Right knee pain 06/14/2015   Diabetic neuropathy ([place]) 03/21/2014   Bunion 03/21/2014   PVD (pulmonary valve disease) 01/23/2014   Onychomycosis 12/20/2013   PVD (peripheral vascular disease) ([place]) 12/19/2013   Gout 12/13/2013   Depression    Lumbar canal stenosis 05/26/2013   Neuralgia neuritis, sciatic nerve 05/26/2013   Degenerative arthritis of lumbar spine with cord compression 04/24/2013   Arthritis of shoulder region, left 09/11/2011   Type II  diabetes mellitus with neurological manifestations ([place]) 01/17/2009   Anemia 10/15/2008   CAD, NATIVE VESSEL 06/09/2008   Hyperlipidemia 06/15/2007   Essential hypertension 06/15/2007   GERD 06/15/2007   TUBULOVILLOUS ADENOMA, 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   DIVERTICULOSIS, 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    Orientation RESPIRATION BLADDER Height & Weight     Self, Time, Situation, Place  Normal Continent Weight: 133 lb (60.3 kg) Height:  '4\' 11"'$  (149.9 cm)  BEHAVIORAL SYMPTOMS/MOOD NEUROLOGICAL BOWEL NUTRITION STATUS      Continent    AMBULATORY STATUS COMMUNICATION OF NEEDS Skin   Limited Assist Verbally Normal                       Personal Care Assistance Level of Assistance  Bathing, Feeding, Dressing Bathing Assistance: Limited assistance Feeding assistance: Limited assistance Dressing Assistance: Limited assistance     Functional Limitations Info  Sight Sight Info: Adequate        SPECIAL CARE FACTORS FREQUENCY                       Contractures Contractures Info: Not present    Additional Factors Info  Code Status, Allergies               Current Medications (02/02/2022):  This is the current hospital active medication list Current Facility-Administered Medications  Medication Dose Route Frequency Provider Last Rate Last Admin   acetaminophen (TYLENOL) tablet 650 mg  650 mg Oral Q6H PRN Shela Leff, MD       Or   acetaminophen (TYLENOL) suppository 650 mg  650 mg Rectal Q6H PRN Shela Leff, MD  acetaminophen (TYLENOL) tablet 1,000 mg  1,000 mg Oral BID Dwyane Dee, MD   1,000 mg at 02/02/22 1937   amLODipine (NORVASC) tablet 10 mg  10 mg Oral Daily Dwyane Dee, MD   10 mg at 02/02/22 0900   aspirin EC tablet 81 mg  81 mg Oral Daily Dwyane Dee, MD   81 mg at 02/02/22 0900   busPIRone (BUSPAR) tablet 15 mg  15 mg Oral BID Shela Leff, MD   15 mg at 02/02/22 0859   carvedilol (COREG) tablet 6.25 mg  6.25 mg Oral BID Shela Leff, MD   6.25 mg at 02/02/22 0901   cephALEXin (KEFLEX) capsule 500 mg  500 mg Oral Q12H Shela Leff, MD   500 mg at 02/02/22 0900   divalproex (DEPAKOTE) DR tablet 250 mg  250 mg Oral BID Dwyane Dee, MD   250 mg at 02/02/22 0901   Followed by   Derrill Memo ON 02/14/2022] divalproex (DEPAKOTE) DR tablet 125 mg  125 mg Oral BID Dwyane Dee, MD       Followed by   Derrill Memo ON 02/28/2022] divalproex (DEPAKOTE) DR tablet 125 mg  125 mg Oral QHS Dwyane Dee, MD       donepezil (ARICEPT) tablet 10 mg  10 mg Oral Daily Shela Leff, MD   10 mg at 02/02/22 0901   gabapentin (NEURONTIN) capsule 300 mg  300 mg Oral QHS Shela Leff, MD   300 mg at 02/01/22 2028   ketorolac (TORADOL) 15 MG/ML injection 15 mg  15 mg Intravenous Once Dwyane Dee, MD       meloxicam [facility]) tablet 7.5 mg  7.5 mg Oral Daily Dwyane Dee, MD   7.5 mg at 02/02/22 0859   memantine (NAMENDA) tablet 10 mg  10 mg Oral BID Shela Leff, MD   10 mg at 02/02/22 0918   pantoprazole (PROTONIX) EC tablet 40 mg  40 mg Oral Daily Shela Leff, MD   40 mg at 02/02/22 9024   polyethylene glycol (MIRALAX / GLYCOLAX) packet 17 g  17 g Oral Daily Dwyane Dee, MD   17 g at 02/01/22 1200   QUEtiapine (SEROQUEL) tablet 50 mg  50 mg Oral QHS Shela Leff, MD   50 mg at 02/01/22 2027   senna-docusate (Senokot-S) tablet 1 tablet  1 tablet Oral BID Dwyane Dee, MD   1 tablet at 02/02/22 0973     Discharge Medications: Please see discharge summary for a list of discharge medications.  Relevant Imaging Results:  Relevant Lab Results:   Additional Information [place]  Pt SSI# [id number]  Rodney Booze, LCSW

## 2022-02-02 NOTE — Evaluation (Signed)
Occupational Therapy Evaluation Patient Details Name: [name] MRN: [id number] DOB: 05/26/1930 Today's Date: 02/02/2022   History of Present Illness Patient is a 88 year old female who was admitted with AKI, acute cystitis,acute metabolic encephalopathy,and hypokalemia.  XHB:ZJIRCVEL, hypertension, depression, CAD status post CABG, hyperlipidemia, type 2 diabetes, fibromyalgia GERD.   Clinical Impression   Patient is a 88 year old female who was admitted for above.patient reported living at home alone prior level. No family in room to verify but chart review stated patient lives at [place].  Patients pain in RUE, RLE and R flank impacted participation in session. Patient had all pain medications onboard at this time. Patient was noted to have decreased functional activity tolernace, decreased ROM, decreased BUE strength, decreased endurance, decreased sitting balance, decreased standing balanced, decreased safety awareness, and decreased knowledge of AE/AD impacting participation in ADLs. Patient would continue to benefit from skilled OT services at this time while admitted and after d/c to address noted deficits in order to improve overall safety and independence in ADLs.     Recommendations for follow up therapy are one component of a multi-disciplinary discharge planning process, led by the attending physician.  Recommendations may be updated based on patient status, additional functional criteria and insurance authorization.   Follow Up Recommendations  Skilled nursing-short term rehab (<3 hours/day)    Assistance Recommended at Discharge Intermittent Supervision/Assistance  Patient can return home with the following Two people to help with bathing/dressing/bathroom;Two people to help with walking and/or transfers;Assistance with cooking/housework;Direct supervision/assist for medications management;Assist for transportation;Assistance with feeding;Direct supervision/assist for  financial management;Help with stairs or ramp for entrance    Functional Status Assessment  Patient has had a recent decline in their functional status and demonstrates the ability to make significant improvements in function in a reasonable and predictable amount of time.  Equipment Recommendations  Other (comment) (defer to next venue)    Recommendations for Other Services       Precautions / Restrictions Precautions Precautions: Fall Restrictions Weight Bearing Restrictions: No      Mobility Bed Mobility Overal bed mobility: Needs Assistance Bed Mobility: Rolling Rolling: Max assist, +2 for safety/equipment   Supine to sit: Max assist, HOB elevated Sit to supine: Max assist, +2 for physical assistance   General bed mobility comments: multimodal cues for technique, assist to bring LEs to EOB and trunk upright; more assist upon return to supine due to fatigue and pain    Transfers                          Balance Overall balance assessment: Needs assistance Sitting-balance support: Single extremity supported, Feet supported Sitting balance-Leahy Scale: Poor Sitting balance - Comments: leaning to L side       Standing balance comment: deferred                           ADL either performed or assessed with clinical judgement   ADL Overall ADL's : Needs assistance/impaired Eating/Feeding: Minimal assistance;Sitting   Grooming: Minimal assistance;Sitting   Upper Body Bathing: Moderate assistance;Bed level   Lower Body Bathing: Maximal assistance;Bed level   Upper Body Dressing : Moderate assistance;Bed level   Lower Body Dressing: Maximal assistance;Bed level   Toilet Transfer: +2 for physical assistance;+2 for safety/equipment Toilet Transfer Details (indicate cue type and reason): patient declined attempts to stand. patient noted to have pain in R side UE and LE  impacting sitting balance and participation in session. patient unwilling  to move RUE or tolerate thoughts of touch Toileting- Clothing Manipulation and Hygiene: Bed level;Total assistance               Vision         Perception     Praxis      Pertinent Vitals/Pain Pain Assessment Pain Assessment: Faces Faces Pain Scale: Hurts even more Pain Location: RUE, RLE and R flank Pain Descriptors / Indicators: Discomfort, Guarding, Grimacing, Tender Pain Intervention(s): Limited activity within patient's tolerance, Monitored during session, Repositioned, Premedicated before session     Hand Dominance Right   Extremity/Trunk Assessment Upper Extremity Assessment Upper Extremity Assessment: RUE deficits/detail RUE Deficits / Details: patient reporting arthritis in RUE and RLE. patient defensive of light touch to UE with no tolerance of movement. RUE: Unable to fully assess due to pain   Lower Extremity Assessment Lower Extremity Assessment: Defer to PT evaluation   Cervical / Trunk Assessment Cervical / Trunk Assessment: Kyphotic   Communication Communication Communication: No difficulties   Cognition Arousal/Alertness: Awake/alert Behavior During Therapy: WFL for tasks assessed/performed Overall Cognitive Status: History of cognitive impairments - at baseline                                 General Comments: plesant  and easy to redirect     General Comments       Exercises     Shoulder Instructions      Home Living Family/patient expects to be discharged to:: Assisted living                             Home Equipment: Conservation officer, nature (2 wheels)   Additional Comments: From [employer] per chart      Prior Functioning/Environment Prior Level of Function : Needs assist             Mobility Comments: from assisted living facility, pt poor historian, per chart review, ambulatory with RW          OT Problem List: Decreased range of motion;Decreased activity tolerance;Impaired balance (sitting  and/or standing);Decreased coordination;Decreased cognition;Decreased safety awareness;Decreased knowledge of precautions;Decreased knowledge of use of DME or AE;Cardiopulmonary status limiting activity;Impaired sensation;Pain;Impaired UE functional use;Increased edema      OT Treatment/Interventions:      OT Goals(Current goals can be found in the care plan section) Acute Rehab OT Goals Patient Stated Goal: none stated OT Goal Formulation: Patient unable to participate in goal setting Time For Goal Achievement: 02/16/22 Potential to Achieve Goals: Fair  OT Frequency:      Co-evaluation              AM-PAC OT "6 Clicks" Daily Activity     Outcome Measure Help from another person eating meals?: A Little Help from another person taking care of personal grooming?: A Lot Help from another person toileting, which includes using toliet, bedpan, or urinal?: Total Help from another person bathing (including washing, rinsing, drying)?: A Lot Help from another person to put on and taking off regular upper body clothing?: A Lot Help from another person to put on and taking off regular lower body clothing?: A Lot 6 Click Score: 12   End of Session Nurse Communication: Mobility status;Other (comment) (pain levels and locations during session)  Activity Tolerance: Patient limited by pain Patient left: in bed;with call  bell/phone within reach;with bed alarm set  OT Visit Diagnosis: Pain;Other abnormalities of gait and mobility (R26.89);Unsteadiness on feet (R26.81) Pain - Right/Left: Right Pain - part of body: Shoulder;Arm;Hand;Hip;Knee;Leg;Ankle and joints of foot                Time: 1658-0063 OT Time Calculation (min): 18 min Charges:  OT General Charges $OT Visit: 1 Visit OT Evaluation $OT Eval Moderate Complexity: 1 Mod  [name] OTR/L, MS Acute Rehabilitation Department Office# [phone]   Marcellina Millin 02/02/2022, 10:58 AM

## 2022-02-02 NOTE — Progress Notes (Signed)
Sharyn Lull has texted back and stated that she will review in the AM.

## 2022-02-02 NOTE — Progress Notes (Signed)
[facility] [address] Manufacturing engineer [facility]) Hospital Liaison note:  This patient is currently enrolled in [facility] outpatient-based Palliative Care. Will continue to follow for disposition.  Please call with any outpatient palliative questions or concerns.  Thank you, Lorelee Market, LPN [facility] Liaison [phone]

## 2022-02-02 NOTE — Social Work (Signed)
CSW spoke to the daughter, the daughter has refused [facility] at this time no other facilities have accepted the Pt.The daughter has reached out to [facility] to see if there were any beds, [facility] that they would look at her in the [facility]. CSW has explained to the daughter that if no other offers come through they would have to choose what offer they received,or we would have to start looking at other options. CSW left 2 VM with Monica Becton at [facility] in regards to this Pt. TOC will continue to follow the Pt for DC options.

## 2022-02-03 DIAGNOSIS — M19031 Primary osteoarthritis, right wrist: Secondary | ICD-10-CM

## 2022-02-03 DIAGNOSIS — F0390 Unspecified dementia without behavioral disturbance: Secondary | ICD-10-CM | POA: Diagnosis not present

## 2022-02-03 DIAGNOSIS — G9341 Metabolic encephalopathy: Secondary | ICD-10-CM | POA: Diagnosis not present

## 2022-02-03 DIAGNOSIS — N179 Acute kidney failure, unspecified: Secondary | ICD-10-CM | POA: Diagnosis not present

## 2022-02-03 LAB — CBC WITH DIFFERENTIAL/PLATELET
Abs Immature Granulocytes: 0.12 10*3/uL — ABNORMAL HIGH (ref 0.00–0.07)
Basophils Absolute: 0 10*3/uL (ref 0.0–0.1)
Basophils Relative: 1 %
Eosinophils Absolute: 0.2 10*3/uL (ref 0.0–0.5)
Eosinophils Relative: 3 %
HCT: 37.6 % (ref 36.0–46.0)
Hemoglobin: 12 g/dL (ref 12.0–15.0)
Immature Granulocytes: 2 %
Lymphocytes Relative: 29 %
Lymphs Abs: 2.2 10*3/uL (ref 0.7–4.0)
MCH: 31.6 pg (ref 26.0–34.0)
MCHC: 31.9 g/dL (ref 30.0–36.0)
MCV: 98.9 fL (ref 80.0–100.0)
Monocytes Absolute: 0.7 10*3/uL (ref 0.1–1.0)
Monocytes Relative: 9 %
Neutro Abs: 4.3 10*3/uL (ref 1.7–7.7)
Neutrophils Relative %: 56 %
Platelets: 191 10*3/uL (ref 150–400)
RBC: 3.8 MIL/uL — ABNORMAL LOW (ref 3.87–5.11)
RDW: 13.3 % (ref 11.5–15.5)
WBC: 7.5 10*3/uL (ref 4.0–10.5)
nRBC: 0 % (ref 0.0–0.2)

## 2022-02-03 LAB — BASIC METABOLIC PANEL
Anion gap: 9 (ref 5–15)
BUN: 17 mg/dL (ref 8–23)
CO2: 30 mmol/L (ref 22–32)
Calcium: 8.8 mg/dL — ABNORMAL LOW (ref 8.9–10.3)
Chloride: 99 mmol/L (ref 98–111)
Creatinine, Ser: 0.81 mg/dL (ref 0.44–1.00)
GFR, Estimated: >60 mL/min (ref >60)
Glucose, Bld: 98 mg/dL (ref 70–99)
Potassium: 3.5 mmol/L (ref 3.5–5.1)
Sodium: 138 mmol/L (ref 135–145)

## 2022-02-03 LAB — MAGNESIUM: Magnesium: 2.2 mg/dL (ref 1.7–2.4)

## 2022-02-03 MED ORDER — ORAL CARE MOUTH RINSE: 15.0000 mL | OROMUCOSAL | Status: DC | PRN

## 2022-02-03 MED ORDER — ORAL CARE MOUTH RINSE
15.0000 mL | OROMUCOSAL | Status: DC
  Administered 2022-02-03 – 2022-02-04 (×3): 15 mL via OROMUCOSAL

## 2022-02-03 NOTE — Progress Notes (Signed)
Progress Note    [name]   IOX:[id number]  DOB: 1931-02-24  DOA: 01/30/2022     3 PCP: Binnie Rail, MD  Initial CC: confusion   Hospital Course: [name] is an 88 yo female with PMH dementia, hypertension, depression, CAD status post CABG, hyperlipidemia, type 2 diabetes, fibromyalgia who presented to the ED with altered mentation.   She was reported to have had hypoxia prior to EMS arrival however was found to have oxygen saturations low 90s.  She was placed on 2 L and brought to the ER for further work-up. She had recently been transferred to memory care unit on 01/22/2022.  She had been weaned off of Depakote by psychiatry outpatient however was placed back on original dosing upon transfer to memory care. Notable lab findings on work-up include BUN 47, creatinine 1.59 up from normal baseline's. Initial WBC 12.9. UA showed trace LE, negative nitrite, 21-50 WBC, hyaline casts, rare bacteria. She was started on Keflex. IV fluids also initiated on admission.  Interval History:  No events overnight. Pleasant and sitting in recliner. Daughter bedside this afternoon.  Right wrist still swollen and warm some but less tender than previously.  Patient planning to go to [facility] tomorrow.   Assessment and Plan:  AKI - resolved  -Suspected due to prerenal from poor oral intake - baseline creatinine ~ 1 - patient presents with increase in creat >0.3 mg/dL above baseline, creat increase >1.5x baseline presumed to have occurred within past 7 days PTA - creat 1.59, BUN 47 on admission -Creatinine has improved and normalized with fluids.  Okay to discontinue   Acute cystitis - treated  -Encephalopathy possibly accounting as symptom as patient unable to provide much collateral information -UA notable for trace LE, negative nitrite, rare bacteria, 21-50 WBC - continue keflex to complete course x 5 days total - followed up cultures; equivocal. Finish abx course.     Acute metabolic encephalopathy Likely multifactorial from dementia, AKI/dehydration, and ?UTI.  No focal neurodeficit on exam.  Moreso suspect contribution from underlying dementia -CT head unremarkable for acute findings, shows underlying cerebral atrophy - TSH normal, B12 725, NH3 negative - depakote level 33 (low) ---was being tapered off prior to tx to memory care  Dementia Mood disorder -Discussed with daughter, prefers Depakote to be tapered back off since was restarted upon transfer to memory care -I discussed with psychiatry and taper ordered per recommendations for depakote - continue depakote taper at discharge  Physical deconditioning -Evaluated by PT.  Recommended for SNF - Patient has been accepted to [facility].  Tentative plan is for discharge on 02/04/2022  Hypokalemia -Replete as needed  Osteoarthritis Right wrist pain due to OA - Continue Tylenol and Mobic - prior xray R hand/wrist from 10/11 show severe osteoarthritis   Diet controlled type 2 diabetes A1c 5.8 on 09/05/2021 - glucose stable, d/c CBGs   Hypertension -Continue amlodipine and Coreg   GERD -Continue PPI   Chronic pain -Continue gabapentin   CAD status post CABG Stable.  EKG without acute ischemic changes and patient is not endorsing chest pain. -Continue beta-blocker and asa   Old records reviewed in assessment of this patient  Antimicrobials: Keflex 10/13 >> 10/17   DVT prophylaxis:  SCDs Start: 01/31/22 0346   Code Status:   Code Status: Full Code  Mobility Assessment (last 72 hours)     Mobility Assessment     Row Name 02/03/22 1442 02/02/22 2130 02/02/22 1056 02/02/22 0930 02/01/22 2320  Does patient have an order for bedrest or is patient medically unstable -- No - Continue assessment -- No - Continue assessment No - Continue assessment   What is the highest level of mobility based on the progressive mobility assessment? Level 2 ([place]) - Balance while sitting on  edge of bed and cannot stand Level 2 (Chairfast) - Balance while sitting on edge of bed and cannot stand Level 2 (Chairfast) - Balance while sitting on edge of bed and cannot stand Level 2 (Chairfast) - Balance while sitting on edge of bed and cannot stand Level 1 (Bedfast) - Unable to balance while sitting on edge of bed   Is the above level different from baseline mobility prior to current illness? -- Yes - Recommend PT order -- Yes - Recommend PT order Yes - Recommend PT order    Row Name 02/01/22 2000 02/01/22 1340 02/01/22 1053 02/01/22 0921 01/31/22 1935   Does patient have an order for bedrest or is patient medically unstable No - Continue assessment -- No - Continue assessment No - Continue assessment No - Continue assessment   What is the highest level of mobility based on the progressive mobility assessment? Level 1 (Bedfast) - Unable to balance while sitting on edge of bed Level 1 (Bedfast) - Unable to balance while sitting on edge of bed Level 1 (Bedfast) - Unable to balance while sitting on edge of bed Level 1 (Bedfast) - Unable to balance while sitting on edge of bed Level 1 (Bedfast) - Unable to balance while sitting on edge of bed   Is the above level different from baseline mobility prior to current illness? Yes - Recommend PT order -- Yes - Recommend PT order Yes - Recommend PT order Yes - Recommend PT order    [place] Name 01/31/22 1700           What is the highest level of mobility based on the progressive mobility assessment? Level 1 (Bedfast) - Unable to balance while sitting on edge of bed       Is the above level different from baseline mobility prior to current illness? Yes - Recommend PT order                Barriers to discharge: none Disposition Plan:  SNF Status is: Inpt  Objective: Blood pressure 137/64, pulse 79, temperature 98.3 F (36.8 C), temperature source Oral, resp. rate 16, height '4\' 11"'$  (1.499 m), weight 60.3 kg, SpO2 99 %.  Examination:  Physical  Exam Constitutional:      Comments: Grossly but pleasantly confused   HENT:     Head: Normocephalic and atraumatic.     Mouth/Throat:     Mouth: Mucous membranes are moist.  Eyes:     Extraocular Movements: Extraocular movements intact.  Cardiovascular:     Rate and Rhythm: Normal rate and regular rhythm.  Pulmonary:     Effort: Pulmonary effort is normal.     Breath sounds: Normal breath sounds.  Abdominal:     General: Bowel sounds are normal. There is no distension.     Palpations: Abdomen is soft.     Tenderness: There is no abdominal tenderness.  Musculoskeletal:        General: Normal range of motion.     Cervical back: Normal range of motion.  Skin:    General: Skin is warm.  Neurological:     Mental Status: She is alert. She is disoriented.      Consultants:  Procedures:    Data Reviewed: Results for orders placed or performed during the hospital encounter of 01/30/22 (from the past 24 hour(s))  Glucose, capillary     Status: Abnormal   Collection Time: 02/02/22  4:38 PM  Result Value Ref Range   Glucose-Capillary 124 (H) 70 - 99 mg/dL  Basic metabolic panel     Status: Abnormal   Collection Time: 02/03/22  8:27 AM  Result Value Ref Range   Sodium 138 135 - 145 mmol/L   Potassium 3.5 3.5 - 5.1 mmol/L   Chloride 99 98 - 111 mmol/L   CO2 30 22 - 32 mmol/L   Glucose, Bld 98 70 - 99 mg/dL   BUN 17 8 - 23 mg/dL   Creatinine, Ser 0.81 0.44 - 1.00 mg/dL   Calcium 8.8 (L) 8.9 - 10.3 mg/dL   GFR, Estimated >60 >60 mL/min   Anion gap 9 5 - 15  CBC with Differential/Platelet     Status: Abnormal   Collection Time: 02/03/22  8:27 AM  Result Value Ref Range   WBC 7.5 4.0 - 10.5 K/uL   RBC 3.80 (L) 3.87 - 5.11 MIL/uL   Hemoglobin 12.0 12.0 - 15.0 g/dL   HCT 37.6 36.0 - 46.0 %   MCV 98.9 80.0 - 100.0 fL   MCH 31.6 26.0 - 34.0 pg   MCHC 31.9 30.0 - 36.0 g/dL   RDW 13.3 11.5 - 15.5 %   Platelets 191 150 - 400 K/uL   nRBC 0.0 0.0 - 0.2 %   Neutrophils  Relative % 56 %   Neutro Abs 4.3 1.7 - 7.7 K/uL   Lymphocytes Relative 29 %   Lymphs Abs 2.2 0.7 - 4.0 K/uL   Monocytes Relative 9 %   Monocytes Absolute 0.7 0.1 - 1.0 K/uL   Eosinophils Relative 3 %   Eosinophils Absolute 0.2 0.0 - 0.5 K/uL   Basophils Relative 1 %   Basophils Absolute 0.0 0.0 - 0.1 K/uL   Immature Granulocytes 2 %   Abs Immature Granulocytes 0.12 (H) 0.00 - 0.07 K/uL  Magnesium     Status: None   Collection Time: 02/03/22  8:27 AM  Result Value Ref Range   Magnesium 2.2 1.7 - 2.4 mg/dL    I have Reviewed nursing notes, Vitals, and Lab results since pt's last encounter. Pertinent lab results : see above I have reviewed the last note from staff over past 24 hours I have discussed pt's care plan and test results with nursing staff, case manager   LOS: 3 days   Dwyane Dee, MD [facility] 02/03/2022, 3:01 PM

## 2022-02-03 NOTE — TOC Initial Note (Addendum)
Transition of Care [facility]) - Initial/Assessment Note    Patient Details  Name: [name] MRN: [id number] Date of Birth: May 28, 1930  Transition of Care [facility] CM/SW Contact:    Dessa Phi, RN Phone Number: 02/03/2022, 11:59 AM  [facility] [facility]/memory care. PT recc [facility] SNF-Currently AF bed offer. Dtr prefers [facility] will review & let me know if she can make bed offer-await outcome. Will update dtr after outcome determined from [address]  -12:[address]p-[address] accepted can d/c tomorrow after 2p-dtr [name] aware.                 Expected Discharge Plan: Skilled Nursing Facility Barriers to Discharge: Continued Medical Work up   Patient Goals and CMS Choice Patient states their goals for this hospitalization and ongoing recovery are::  ([facility] SNF) CMS Medicare.gov Compare Post Acute Care list provided to:: Patient Represenative (must comment) ([name](dtr)) Choice offered to / list presented to : Adult Children  Expected Discharge Plan and Services Expected Discharge Plan: [place]   Discharge Planning Services: CM Consult Post Acute Care Choice: [place] Living arrangements for the past 2 months: [place]                                      Prior Living Arrangements/Services Living arrangements for the past 2 months: [place] Lives with:: Facility Resident Patient language and need for interpreter reviewed:: Yes Do you feel safe going back to the place where you live?: Yes      Need for Family Participation in Patient Care: Yes (Comment) Care giver support system in place?: Yes (comment)   Criminal Activity/Legal Involvement Pertinent to Current Situation/Hospitalization: No - Comment as needed  Activities of Daily Living Home Assistive Devices/Equipment: Walker (specify type), Cane (specify quad or straight), Grab bars around toilet, Grab bars in shower,  Eyeglasses ADL Screening (condition at time of admission) Patient's cognitive ability adequate to safely complete daily activities?: No Is the patient deaf or have difficulty hearing?: No Does the patient have difficulty seeing, even when wearing glasses/contacts?: No Does the patient have difficulty concentrating, remembering, or making decisions?: Yes Patient able to express need for assistance with ADLs?: No Does the patient have difficulty dressing or bathing?: Yes Independently performs ADLs?: No Does the patient have difficulty walking or climbing stairs?: Yes Weakness of Legs: Both Weakness of Arms/Hands: Right  Permission Sought/Granted Permission sought to share information with : Case Manager Permission granted to share information with : Yes, Verbal Permission Granted  Share Information with NAME:  (Case Manager)           Emotional Assessment Appearance:: Appears stated age Attitude/Demeanor/Rapport: Gracious Affect (typically observed): Accepting Orientation: : Oriented to Self Alcohol / Substance Use: Not Applicable Psych Involvement: No (comment)  Admission diagnosis:  Metabolic encephalopathy [F64.33] Dehydration [E86.0] Hypokalemia [E87.6] Acute cystitis without hematuria [N30.00] AKI (acute kidney injury) ([place]) [N17.9] Valproic acid causing adverse effect in therapeutic use, initial encounter [T42.6X5A] Patient Active Problem List   Diagnosis Date Noted   Osteoarthritis 02/02/2022   AKI (acute kidney injury) ([place]) 01/31/2022   UTI (urinary tract infection) 29/51/8841   Acute metabolic encephalopathy 66/09/3014   Hypoxia 01/31/2022   Hypokalemia 01/31/2022   Type 2 diabetes mellitus ([place]) 01/31/2022   Acute respiratory failure with hypoxia ([place]) 01/30/2022   Dehydration 01/30/2022   Drug reaction 01/30/2022   Encounter  for therapeutic drug level monitoring 01/13/2022   Encounter for testing for latent tuberculosis 01/13/2022   Thrombocytopenia ([place])  01/08/2022   Delirium with dementia 01/08/2022   Palliative care encounter 09/21/2021   Urinary frequency 09/18/2020   Aortic atherosclerosis ([place]) 08/07/2020   Dementia ([place]) 07/27/2017   Chronic RUQ pain 05/28/2016   Right knee pain 06/14/2015   Diabetic neuropathy ([place]) 03/21/2014   Bunion 03/21/2014   PVD (pulmonary valve disease) 01/23/2014   Onychomycosis 12/20/2013   PVD (peripheral vascular disease) ([place]) 12/19/2013   Gout 12/13/2013   Depression    Lumbar canal stenosis 05/26/2013   Neuralgia neuritis, sciatic nerve 05/26/2013   Degenerative arthritis of lumbar spine with cord compression 04/24/2013   Arthritis of shoulder region, left 09/11/2011   Type II diabetes mellitus with neurological manifestations ([place]) 01/17/2009   Anemia 10/15/2008   CAD, NATIVE VESSEL 06/09/2008   Hyperlipidemia 06/15/2007   Essential hypertension 06/15/2007   GERD 06/15/2007   TUBULOVILLOUS ADENOMA, 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   DIVERTICULOSIS, 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   PCP:  Binnie Rail, MD Pharmacy:   [facility] [id number] [place], [place] - [address] [place] [address] [place] [address] Phone: [phone] Fax: [phone]     Social Determinants of Health (SDOH) Interventions    Readmission Risk Interventions     No data to display

## 2022-02-03 NOTE — Plan of Care (Signed)

## 2022-02-03 NOTE — Progress Notes (Signed)
Physical Therapy Treatment Patient Details Name: [name] MRN: [id number] DOB: [birth year] Today's Date: 02/03/2022   History of Present Illness Patient is a 88 year old female who was admitted with AKI, acute cystitis,acute metabolic encephalopathy,and hypokalemia.  PRX:YVOPFYTW, hypertension, depression, CAD status post CABG, hyperlipidemia, type 2 diabetes, fibromyalgia GERD.    PT Comments    Pt seen for PT tx with pt's daughter [name]) present throughout session. Pt with hx of cognitive deficits & is pleasantly confused throughout session. Pt requires frequent cuing for sustained attention to task as pt is easily distracted & internally distracted. Pt requires max assist for supine>sit & attempts STS multiple times from EOB without success, however, pt is able to complete bed>drop arm recliner via lateral scoot with CGA<>close supervision with extra time. Pt & daughter appreciative of PT session. Pt endorses R wrist pain but daughter reports it may be due to "pseudo gout" or arthritis. Continue to recommend STR upon d/c to maximize independence with functional mobility & reduce fall risk prior to return home. Will continue to follow pt acutely to address strengthening, bed mobility, and transfers with LRAD.    Recommendations for follow up therapy are one component of a multi-disciplinary discharge planning process, led by the attending physician.  Recommendations may be updated based on patient status, additional functional criteria and insurance authorization.  Follow Up Recommendations  Skilled nursing-short term rehab (<3 hours/day) Can patient physically be transported by private vehicle: No   Assistance Recommended at Discharge Frequent or constant Supervision/Assistance  Patient can return home with the following Two people to help with walking and/or transfers;A lot of help with bathing/dressing/bathroom;Assistance with feeding   Equipment Recommendations  None  recommended by PT    Recommendations for Other Services       Precautions / Restrictions Precautions Precautions: Fall Restrictions Weight Bearing Restrictions: No     Mobility  Bed Mobility Overal bed mobility: Needs Assistance Bed Mobility: Supine to Sit     Supine to sit: Max assist, HOB elevated (Pt participates in moving BLE to EOB but requires assistance to upright trunk despite HOB elevated & use of bed rails.)          Transfers Overall transfer level: Needs assistance   Transfers: Sit to/from Stand, Bed to chair/wheelchair/BSC            Lateral/Scoot Transfers: Min guard (Pt is able to complete lateral scoot bed>drop arm recliner on R with surfaces at equal heights with fair<>good demo of head/hips relationship without cuing. PT provides supervision<>CGA for overall safety.) General transfer comment: Pt attempted STS from elevated EOB with RW & with BUE HHA but pt does not initiate anterior weight shifting or pushing to stand despite multimodal cuing & facilitation.    Ambulation/Gait                   Stairs             Wheelchair Mobility    Modified Rankin (Stroke Patients Only)       Balance Overall balance assessment: Needs assistance Sitting-balance support: Bilateral upper extremity supported, Feet supported Sitting balance-Leahy Scale: Good                                      Cognition Arousal/Alertness: Awake/alert Behavior During Therapy: WFL for tasks assessed/performed Overall Cognitive Status: History of cognitive impairments - at baseline  General Comments: Pt is oriented to name/self but unable to recall her age, follows simple commands throughout session with extra time & cuing for sustained attention to task as pt is internally distracted.        Exercises      General Comments        Pertinent Vitals/Pain Pain Assessment Pain Assessment:  Faces Faces Pain Scale: Hurts even more Pain Location: R wrist, R LE Pain Descriptors / Indicators: Discomfort, Guarding, Grimacing, Tender Pain Intervention(s): Repositioned, Monitored during session    Home Living                          Prior Function            PT Goals (current goals can now be found in the care plan section) Acute Rehab PT Goals PT Goal Formulation: Patient unable to participate in goal setting Time For Goal Achievement: 02/15/22 Potential to Achieve Goals: Fair Progress towards PT goals: Progressing toward goals    Frequency    Min 2X/week      PT Plan Current plan remains appropriate    Co-evaluation              AM-PAC PT "6 Clicks" Mobility   Outcome Measure  Help needed turning from your back to your side while in a flat bed without using bedrails?: A Lot Help needed moving from lying on your back to sitting on the side of a flat bed without using bedrails?: A Lot Help needed moving to and from a bed to a chair (including a wheelchair)?: A Little Help needed standing up from a chair using your arms (e.g., wheelchair or bedside chair)?: Total Help needed to walk in hospital room?: Total Help needed climbing 3-5 steps with a railing? : Total 6 Click Score: 10    End of Session   Activity Tolerance: Patient tolerated treatment well Patient left: in chair;with chair alarm set;with call bell/phone within reach;with family/visitor present Nurse Communication: Mobility status PT Visit Diagnosis: Muscle weakness (generalized) (M62.81);Difficulty in walking, not elsewhere classified (R26.2)     Time: 5170-0174 PT Time Calculation (min) (ACUTE ONLY): 20 min  Charges:  $Therapeutic Activity: 8-22 mins                     Lavone Nian, PT, DPT 02/03/22, 2:46 PM   Waunita Schooner 02/03/2022, 2:43 PM

## 2022-02-03 NOTE — Care Management Important Message (Signed)
Important Message  Patient Details IM Letter given Name: [name] MRN: [id number] Date of Birth: [birth year]   Medicare Important Message Given:  Yes     [name] 02/03/2022, 1:03 PM

## 2022-02-03 NOTE — Progress Notes (Signed)
Patient sleeping at this time & refused meds/food when offered. Will try again after patient is awake.

## 2022-02-03 NOTE — Plan of Care (Signed)
PT noted no goals in chart. All goals entered & are applicable to current plan of care.  Lavone Nian, PT, DPT 02/03/22, 3:44 PM  Problem: Acute Rehab PT Goals(only PT should resolve) Goal: Pt Will Go Supine/Side To Sit Flowsheets (Taken 02/03/2022 1543) Pt will go Supine/Side to Sit: with supervision Goal: Pt Will Go Sit To Supine/Side Flowsheets (Taken 02/03/2022 1543) Pt will go Sit to Supine/Side: with supervision Goal: Pt Will Transfer Bed To Chair/Chair To Bed Flowsheets (Taken 02/03/2022 1543) Pt will Transfer Bed to Chair/Chair to Bed: with supervision Note: With LRAD Goal: Pt Will Ambulate Flowsheets (Taken 02/03/2022 1543) Pt will Ambulate:  100 feet  with supervision  with least restrictive assistive device

## 2022-02-04 DIAGNOSIS — M545 Low back pain, unspecified: Secondary | ICD-10-CM | POA: Diagnosis not present

## 2022-02-04 DIAGNOSIS — N390 Urinary tract infection, site not specified: Secondary | ICD-10-CM | POA: Diagnosis not present

## 2022-02-04 DIAGNOSIS — F03C18 Unspecified dementia, severe, with other behavioral disturbance: Secondary | ICD-10-CM | POA: Diagnosis not present

## 2022-02-04 DIAGNOSIS — F5105 Insomnia due to other mental disorder: Secondary | ICD-10-CM | POA: Diagnosis not present

## 2022-02-04 DIAGNOSIS — F03918 Unspecified dementia, unspecified severity, with other behavioral disturbance: Secondary | ICD-10-CM | POA: Diagnosis not present

## 2022-02-04 DIAGNOSIS — I700 Atherosclerosis of aorta: Secondary | ICD-10-CM | POA: Diagnosis not present

## 2022-02-04 DIAGNOSIS — R2689 Other abnormalities of gait and mobility: Secondary | ICD-10-CM | POA: Diagnosis not present

## 2022-02-04 DIAGNOSIS — R278 Other lack of coordination: Secondary | ICD-10-CM | POA: Diagnosis not present

## 2022-02-04 DIAGNOSIS — E118 Type 2 diabetes mellitus with unspecified complications: Secondary | ICD-10-CM | POA: Diagnosis not present

## 2022-02-04 DIAGNOSIS — G9341 Metabolic encephalopathy: Secondary | ICD-10-CM | POA: Diagnosis not present

## 2022-02-04 DIAGNOSIS — N3000 Acute cystitis without hematuria: Secondary | ICD-10-CM

## 2022-02-04 DIAGNOSIS — R0902 Hypoxemia: Secondary | ICD-10-CM

## 2022-02-04 DIAGNOSIS — D649 Anemia, unspecified: Secondary | ICD-10-CM | POA: Diagnosis not present

## 2022-02-04 DIAGNOSIS — I10 Essential (primary) hypertension: Secondary | ICD-10-CM

## 2022-02-04 DIAGNOSIS — E876 Hypokalemia: Secondary | ICD-10-CM | POA: Diagnosis not present

## 2022-02-04 DIAGNOSIS — E785 Hyperlipidemia, unspecified: Secondary | ICD-10-CM | POA: Diagnosis not present

## 2022-02-04 DIAGNOSIS — F413 Other mixed anxiety disorders: Secondary | ICD-10-CM | POA: Diagnosis not present

## 2022-02-04 DIAGNOSIS — M6281 Muscle weakness (generalized): Secondary | ICD-10-CM | POA: Diagnosis not present

## 2022-02-04 DIAGNOSIS — R41841 Cognitive communication deficit: Secondary | ICD-10-CM | POA: Diagnosis not present

## 2022-02-04 DIAGNOSIS — N178 Other acute kidney failure: Secondary | ICD-10-CM | POA: Diagnosis not present

## 2022-02-04 DIAGNOSIS — M48061 Spinal stenosis, lumbar region without neurogenic claudication: Secondary | ICD-10-CM | POA: Diagnosis not present

## 2022-02-04 DIAGNOSIS — N179 Acute kidney failure, unspecified: Secondary | ICD-10-CM | POA: Diagnosis not present

## 2022-02-04 DIAGNOSIS — F0390 Unspecified dementia without behavioral disturbance: Secondary | ICD-10-CM | POA: Diagnosis not present

## 2022-02-04 DIAGNOSIS — J9601 Acute respiratory failure with hypoxia: Secondary | ICD-10-CM | POA: Diagnosis not present

## 2022-02-04 DIAGNOSIS — E1165 Type 2 diabetes mellitus with hyperglycemia: Secondary | ICD-10-CM | POA: Diagnosis not present

## 2022-02-04 DIAGNOSIS — M25531 Pain in right wrist: Secondary | ICD-10-CM | POA: Diagnosis not present

## 2022-02-04 DIAGNOSIS — F39 Unspecified mood [affective] disorder: Secondary | ICD-10-CM | POA: Diagnosis not present

## 2022-02-04 DIAGNOSIS — I739 Peripheral vascular disease, unspecified: Secondary | ICD-10-CM | POA: Diagnosis not present

## 2022-02-04 DIAGNOSIS — M19031 Primary osteoarthritis, right wrist: Secondary | ICD-10-CM | POA: Diagnosis not present

## 2022-02-04 HISTORY — DX: Acute cystitis without hematuria: N30.00

## 2022-02-04 MED ORDER — DIVALPROEX SODIUM 125 MG PO DR TAB: 125.0000 mg | DELAYED_RELEASE_TABLET | Freq: Every day | ORAL | 0 refills | Status: DC

## 2022-02-04 MED ORDER — DIVALPROEX SODIUM 125 MG PO DR TAB: 125.0000 mg | DELAYED_RELEASE_TABLET | Freq: Two times a day (BID) | ORAL | 0 refills | Status: DC

## 2022-02-04 MED ORDER — CEPHALEXIN 500 MG PO CAPS: 500.0000 mg | ORAL_CAPSULE | Freq: Two times a day (BID) | ORAL | 0 refills | Status: DC

## 2022-02-04 MED ORDER — DIVALPROEX SODIUM 250 MG PO DR TAB: 250.0000 mg | DELAYED_RELEASE_TABLET | Freq: Two times a day (BID) | ORAL | 0 refills | Status: DC

## 2022-02-04 NOTE — Assessment & Plan Note (Signed)
   Patient regimen of Depakote previously used for associated mood disorder was adjusted after reviewing the psychiatry  Is currently on an extended taper per psychiatry recommendations with the patient is to maintain at time of discharge upon arrival to her facility until the Depakote supply is complete

## 2022-02-04 NOTE — Discharge Summary (Signed)
Physician Discharge Summary   Patient: [name] MRN: [id number] DOB: 13-Jan-1931  Admit date:     01/30/2022  Discharge date: 02/04/22  Discharge Physician: Vernelle Emerald   PCP: Binnie Rail, MD   Recommendations at discharge:   Depakote taper to be followed as instructed Patient to follow-up with her primary care provider Dr. Billey Gosling in 1 to 2 weeks Patient to be brought back to the emergency department if she exhibits any worsening confusion fevers, worsening oral intake or abdominal pain  Discharge Diagnoses: Principal Problem:   Acute metabolic encephalopathy Active Problems:   Acute cystitis without hematuria   AKI (acute kidney injury) ([place])   Dementia with behavioral disturbance ([place])   Essential hypertension   Hypokalemia   Hypoxia   Type 2 diabetes mellitus ([place])   UTI (urinary tract infection)   Osteoarthritis  Resolved Problems:   * No resolved hospital problems. [facility] Course: [name] is an 88 yo female with PMH dementia, hypertension, depression, CAD status post CABG, hyperlipidemia, type 2 diabetes, fibromyalgia who presented to the ED with altered mentation.   She was reported to have had hypoxia prior to EMS arrival however was found to have oxygen saturations low 90s.  She was placed on 2 L and brought to the ER for further work-up. She had recently been transferred to memory care unit on 01/22/2022.  She had been weaned off of Depakote by psychiatry outpatient however was placed back on original dosing upon transfer to memory care. Notable lab findings on work-up include BUN 47, creatinine 1.59 up from normal baseline's. Initial WBC 12.9. UA showed trace LE, negative nitrite, 21-50 WBC, hyaline casts, rare bacteria. She was started on Keflex. IV fluids also initiated on admission.  Assessment and Plan: * Acute metabolic encephalopathy Thought to be multifactorial secondary to volume depletion, Depakote and urinary tract  infection Patient has improved baseline with volume resuscitation, treatment antibiotics as well as adjustment of Depakote tapering regimen based on discussion with psychiatry   Acute cystitis without hematuria Urinalysis suggestive of urinary tract infection Urine tract infection thought to be due to patient's encephalopathy Patient treated with antibiotics and transition to oral Keflex to complete a 5-day course Urine cultures unfortunately revealed multiple species suggesting contamination although clinically the patient is improving and therefore course of antibiotics will be completed.  AKI (acute kidney injury) ([place]) Improved with intravenous volume resuscitation to baseline Thought be secondary to urinary tract infection and volume depletion  Dementia with behavioral disturbance ([facility]) Patient regimen of Depakote previously used for associated mood disorder was adjusted after reviewing the psychiatry Is currently on an extended taper per psychiatry recommendations with the patient is to maintain at time of discharge upon arrival to her facility until the Depakote supply is complete   Essential hypertension Continuing home regimen of Coreg and Norvasc  Hypokalemia Replaced  Hypoxia Transient, resolved  Type 2 diabetes mellitus ([place]) Patient  placed on Accu-Cheks before every meal and nightly with sliding scale insulin Diabetic diet         Pain control - [place] Controlled Substance Reporting System database was reviewed. and patient was instructed, not to drive, operate heavy machinery, perform activities at heights, swimming or participation in water activities or provide baby-sitting services while on Pain, Sleep and Anxiety Medications; until their outpatient Physician has advised to do so again. Also recommended to not to take more than prescribed Pain, Sleep and Anxiety Medications.  Consultants: None Procedures performed: None  Disposition: Skilled nursing  facility - [facility] Diet recommendation:  Cardiac and Carb modified diet DISCHARGE MEDICATION: Allergies as of 02/04/2022   No Known Allergies      Medication List     STOP taking these medications    diclofenac sodium 1 % Gel Commonly known as: VOLTAREN   DULoxetine 60 MG capsule Commonly known as: CYMBALTA   ramipril 10 MG capsule Commonly known as: ALTACE       TAKE these medications    acetaminophen 500 MG tablet Commonly known as: TYLENOL Take 1,000 mg by mouth 2 (two) times daily.   amLODipine 5 MG tablet Commonly known as: NORVASC TAKE ONE TABLET BY MOUTH TWICE A DAY   aspirin EC 81 MG tablet Take 81 mg by mouth daily.   busPIRone 15 MG tablet Commonly known as: BUSPAR Take 1 tablet (15 mg total) by mouth 2 (two) times daily.   carvedilol 6.25 MG tablet Commonly known as: COREG TAKE ONE TABLET BY MOUTH TWICE A DAY   cephALEXin 500 MG capsule Commonly known as: KEFLEX Take 1 capsule (500 mg total) by mouth every 12 (twelve) hours.   cetirizine 10 MG tablet Commonly known as: ZYRTEC TAKE ONE TABLET BY MOUTH DAILY What changed: when to take this   D3-1000 25 MCG (1000 UT) capsule Generic drug: Cholecalciferol Take 1,000 Units by mouth daily.   divalproex 250 MG DR tablet Commonly known as: DEPAKOTE Take 1 tablet (250 mg total) by mouth 2 (two) times daily. Please administer this course of Depakote first What changed:  medication strength how much to take when to take this additional instructions   divalproex 125 MG DR tablet Commonly known as: DEPAKOTE Take 1 tablet (125 mg total) by mouth 2 (two) times daily. Please administer this course of Depakote second Start taking on: February 14, 2022 What changed: You were already taking a medication with the same name, and this prescription was added. Make sure you understand how and when to take each.   divalproex 125 MG DR tablet Commonly known as: DEPAKOTE Take 1 tablet (125 mg total) by  mouth at bedtime. Please administer this course of Depakote third Start taking on: February 28, 2022 What changed: You were already taking a medication with the same name, and this prescription was added. Make sure you understand how and when to take each.   donepezil 10 MG tablet Commonly known as: ARICEPT TAKE ONE TABLET BY MOUTH DAILY   ferrous sulfate 324 MG Tbec Take 324 mg by mouth daily.   gabapentin 300 MG capsule Commonly known as: NEURONTIN TAKE ONE CAPSULE BY MOUTH EVERY NIGHT AT BEDTIME   MELATONIN PO Take 3 mg by mouth at bedtime.   meloxicam 7.5 MG tablet Commonly known as: MOBIC TAKE ONE TABLET BY MOUTH DAILY   memantine 10 MG tablet Commonly known as: NAMENDA Take 1 tablet (10 mg total) by mouth 2 (two) times daily.   omeprazole 40 MG capsule Commonly known as: PRILOSEC TAKE ONE CAPSULE BY MOUTH DAILY   QUEtiapine 50 MG tablet Commonly known as: SEROquel Take 1 tablet (50 mg total) by mouth at bedtime.        Contact information for after-discharge care     Destination     [facility] .   Service: Skilled Nursing Contact information: [address] [place] [phone]                    Discharge  Exam: Filed Weights   01/30/22 1854  Weight: 60.3 kg   Constitutional: Awake alert no associated distress.   Respiratory: clear to auscultation bilaterally, no wheezing, no crackles. Normal respiratory effort. No accessory muscle use.  Cardiovascular: Regular rate and rhythm, no murmurs / rubs / gallops. No extremity edema. 2+ pedal pulses. No carotid bruits.  Abdomen: Abdomen is soft and nontender.  No evidence of intra-abdominal masses.  Positive bowel sounds noted in all quadrants.   Musculoskeletal: No joint deformity upper and lower extremities. Good ROM, no contractures. Normal muscle tone.      Condition at discharge: fair  The results of significant diagnostics from this  hospitalization (including imaging, microbiology, ancillary and laboratory) are listed below for reference.   Imaging Studies: CT HEAD WO CONTRAST (5MM)  Result Date: 01/31/2022 CLINICAL DATA:  Delirium, history of dementia. EXAM: CT HEAD WITHOUT CONTRAST TECHNIQUE: Contiguous axial images were obtained from the base of the skull through the vertex without intravenous contrast. RADIATION DOSE REDUCTION: This exam was performed according to the departmental dose-optimization program which includes automated exposure control, adjustment of the mA and/or kV according to patient size and/or use of iterative reconstruction technique. COMPARISON:  11/18/2018. FINDINGS: Brain: No acute intracranial hemorrhage, midline shift or mass effect. No extra-axial fluid collection. Diffuse atrophy is noted. Periventricular white matter hypodensities are present bilaterally. No hydrocephalus. Vascular: Atherosclerotic calcification of the vertebral arteries, basilar artery, and carotid siphons. Skull: Normal. Negative for fracture or focal lesion. Sinuses/Orbits: No acute finding. Other: None. IMPRESSION: 1. No acute intracranial process. 2. Atrophy with chronic microvascular ischemic changes. Electronically Signed   By: Brett Fairy M.D.   On: 01/31/2022 04:57   DG Chest Port 1 View  Result Date: 01/30/2022 CLINICAL DATA:  Shortness of breath, hypoxia EXAM: PORTABLE CHEST 1 VIEW COMPARISON:  04/17/2014 FINDINGS: There is poor inspiration. Cardiac size is within normal limits. There is previous coronary bypass surgery. Left hemidiaphragm is elevated. There are no signs of alveolar pulmonary edema or focal pulmonary consolidation. There is no pleural effusion or pneumothorax. There is arthroplasty in both shoulders. IMPRESSION: Poor inspiration. There are no signs of pulmonary edema or focal pulmonary consolidation. Electronically Signed   By: Elmer Picker M.D.   On: 01/30/2022 19:24   DG Wrist Complete  Right  Result Date: 01/28/2022 CLINICAL DATA:  Fall 1 week ago, pain and swelling EXAM: RIGHT WRIST - COMPLETE 3+ VIEW; RIGHT HAND - COMPLETE 3+ VIEW COMPARISON:  None Available. FINDINGS: Osteopenia. No fracture or dislocation of the right hand or wrist. Severe osteoarthritic pattern arthrosis about the hand and wrist, worst in the distal interphalangeal joints, thumb basal joints, and radiocarpal joints. Radiocarpal chondrocalcinosis. Vascular calcinosis. Soft tissues otherwise unremarkable. IMPRESSION: 1. No fracture or dislocation of the right hand or wrist. 2. Severe osteoarthritic pattern arthrosis about the hand and wrist, worst in the distal interphalangeal joints, thumb basal joints, and radiocarpal joints. Electronically Signed   By: Delanna Ahmadi M.D.   On: 01/28/2022 10:24   DG Hand Complete Right  Result Date: 01/28/2022 CLINICAL DATA:  Fall 1 week ago, pain and swelling EXAM: RIGHT WRIST - COMPLETE 3+ VIEW; RIGHT HAND - COMPLETE 3+ VIEW COMPARISON:  None Available. FINDINGS: Osteopenia. No fracture or dislocation of the right hand or wrist. Severe osteoarthritic pattern arthrosis about the hand and wrist, worst in the distal interphalangeal joints, thumb basal joints, and radiocarpal joints. Radiocarpal chondrocalcinosis. Vascular calcinosis. Soft tissues otherwise unremarkable. IMPRESSION: 1. No fracture or dislocation of the  right hand or wrist. 2. Severe osteoarthritic pattern arthrosis about the hand and wrist, worst in the distal interphalangeal joints, thumb basal joints, and radiocarpal joints. Electronically Signed   By: Delanna Ahmadi M.D.   On: 01/28/2022 10:24   CT Lumbar Spine Wo Contrast  Result Date: 01/27/2022 CLINICAL DATA:  Low back pain with difficulty walking. EXAM: CT LUMBAR SPINE WITHOUT CONTRAST TECHNIQUE: Multidetector CT imaging of the lumbar spine was performed without intravenous contrast administration. Multiplanar CT image reconstructions were also generated.  RADIATION DOSE REDUCTION: This exam was performed according to the departmental dose-optimization program which includes automated exposure control, adjustment of the mA and/or kV according to patient size and/or use of iterative reconstruction technique. COMPARISON:  Lumbar MRI 07/24/2021 FINDINGS: Segmentation: 5 lumbar vertebra.  Lowest disc space L5-S1 Alignment: 7 mm anterolisthesis L4-5.  4 mm anterolisthesis L5-S1 Vertebrae: Negative for fracture or mass Paraspinal and other soft tissues: Extensive atherosclerotic calcification aorta and iliac arteries without aneurysm. Negative for paraspinous mass or adenopathy. Disc levels: T12-L1: Central and right-sided disc protrusion. Mild subarticular stenosis bilaterally L1-2: Disc degeneration with disc bulging.  Negative for stenosis L2-3: Mild disc and facet degeneration.  Negative for stenosis L3-4: Mild disc degeneration. Moderate to advanced facet degeneration bilaterally. Negative for spinal or foraminal stenosis L4-5: 7 mm anterolisthesis. Advanced disc and facet degeneration. Mild to moderate spinal stenosis. Mild foraminal stenosis bilaterally. No change from the prior MRI L5-S1: 5 mm anterolisthesis. Bilateral facet degeneration. Moderate central canal stenosis. Moderate subarticular and foraminal stenosis bilaterally. No change from the prior MRI. IMPRESSION: 1. Negative for lumbar fracture. 2. Multilevel degenerative change in the lumbar spine. Mild to moderate spinal stenosis L4-5 with mild foraminal stenosis bilaterally. 3. 5 mm anterolisthesis L5-S1 with moderate central canal stenosis and moderate subarticular and foraminal stenosis bilaterally. 4. Aortic atherosclerosis. Aortic Atherosclerosis (ICD10-I70.0). Electronically Signed   By: Franchot Gallo M.D.   On: 01/27/2022 21:36    Microbiology: Results for orders placed or performed during the hospital encounter of 01/30/22  SARS Coronavirus 2 by RT PCR (hospital order, performed in [facility]  hospital lab) *cepheid single result test* Anterior Nasal Swab     Status: None   Collection Time: 01/30/22  7:28 PM   Specimen: Anterior Nasal Swab  Result Value Ref Range Status   SARS Coronavirus 2 by RT PCR NEGATIVE NEGATIVE Final    Comment: (NOTE) SARS-CoV-2 target nucleic acids are NOT DETECTED.  The SARS-CoV-2 RNA is generally detectable in upper and lower respiratory specimens during the acute phase of infection. The lowest concentration of SARS-CoV-2 viral copies this assay can detect is 250 copies / mL. A negative result does not preclude SARS-CoV-2 infection and should not be used as the sole basis for treatment or other patient management decisions.  A negative result may occur with improper specimen collection / handling, submission of specimen other than nasopharyngeal swab, presence of viral mutation(s) within the areas targeted by this assay, and inadequate number of viral copies (<250 copies / mL). A negative result must be combined with clinical observations, patient history, and epidemiological information.  Fact Sheet for Patients:   https://www.patel.info/  Fact Sheet for Healthcare Providers: https://hall.com/  This test is not yet approved or  cleared by the [place] FDA and has been authorized for detection and/or diagnosis of SARS-CoV-2 by FDA under an Emergency Use Authorization (EUA).  This EUA will remain in effect (meaning this test can be used) for the duration of the COVID-19 declaration under  Section 564(b)(1) of the Act, 21 U.S.C. section 360bbb-3(b)(1), unless the authorization is terminated or revoked sooner.  Performed at [facility], [place] [address] [place], [place] [address]   Urine Culture     Status: Abnormal   Collection Time: 01/30/22  8:55 PM   Specimen: Urine, Clean Catch  Result Value Ref Range Status   Specimen Description   Final    URINE, CLEAN CATCH Performed  at [facility], [place] [address] [place], [place] [address]    Special Requests   Final    NONE Performed at [facility], [place] [address] [place], [place] [address]    Culture (A)  Final    60,000 COLONIES/mL MULTIPLE SPECIES PRESENT, SUGGEST RECOLLECTION   Report Status 02/01/2022 FINAL  Final  MRSA Next Gen by PCR, Nasal     Status: None   Collection Time: 01/31/22  6:56 PM   Specimen: Nasal Mucosa; Nasal Swab  Result Value Ref Range Status   MRSA by PCR Next Gen NOT DETECTED NOT DETECTED Final    Comment: (NOTE) The GeneXpert MRSA Assay (FDA approved for NASAL specimens only), is one component of a comprehensive MRSA colonization surveillance program. It is not intended to diagnose MRSA infection nor to guide or monitor treatment for MRSA infections. Test performance is not FDA approved in patients less than 10 years old. Performed at [facility], [place] [address], [place], [place] [address]     Labs: CBC: Recent Labs  Lab 01/30/22 1928 02/01/22 0503 02/02/22 0409 02/03/22 0827  WBC 7.6 7.0 7.2 7.5  NEUTROABS 5.3 4.4 4.4 4.3  HGB 12.7 11.9* 11.1* 12.0  HCT 38.2 37.1 35.0* 37.6  MCV 96.0 99.5 98.9 98.9  PLT 180 151 158 121   Basic Metabolic Panel: Recent Labs  Lab 01/30/22 1928 01/31/22 0603 02/01/22 0503 02/02/22 0548 02/03/22 0827  NA 139 139 142 141 138  K 3.2* 3.3* 3.0* 3.3* 3.5  CL 97* 102 106 107 99  CO2 '30 26 29 27 30  '$ GLUCOSE 125* 114* 136* 100* 98  BUN 47* 32* '21 11 17  '$ CREATININE 1.59* 0.86 0.53 0.55 0.81  CALCIUM 8.4* 8.9 8.5* 7.9* 8.8*  MG 2.2  --  2.0 1.4* 2.2   Liver Function Tests: Recent Labs  Lab 01/31/22 0603  AST 18  ALT 13  ALKPHOS 62  BILITOT 1.4*  PROT 7.1  ALBUMIN 3.0*   CBG: Recent Labs  Lab 02/01/22 1712 02/01/22 2057 02/02/22 0746 02/02/22 1158 02/02/22 1638  GLUCAP 113* 134* 108* 175* 124*    Discharge time spent: greater than 30  minutes.  Signed: Vernelle Emerald, MD [facility] 02/04/2022

## 2022-02-04 NOTE — Assessment & Plan Note (Signed)
Replaced. °

## 2022-02-04 NOTE — Assessment & Plan Note (Signed)
   Urinalysis suggestive of urinary tract infection  Urine tract infection thought to be due to patient's encephalopathy  Patient treated with antibiotics and transition to oral Keflex to complete a 5-day course  Urine cultures unfortunately revealed multiple species suggesting contamination although clinically the patient is improving and therefore course of antibiotics will be completed.

## 2022-02-04 NOTE — Assessment & Plan Note (Signed)
   Thought to be multifactorial secondary to volume depletion, Depakote and urinary tract infection  Patient has improved baseline with volume resuscitation, treatment antibiotics as well as adjustment of Depakote tapering regimen based on discussion with psychiatry

## 2022-02-04 NOTE — Assessment & Plan Note (Signed)
   Improved with intravenous volume resuscitation to baseline  Thought be secondary to urinary tract infection and volume depletion

## 2022-02-04 NOTE — Progress Notes (Signed)
Occupational Therapy Treatment Patient Details Name: [name] MRN: [id number] DOB: 26-Apr-1930 Today's Date: 02/04/2022   History of present illness Patient is a 88 year old female who was admitted with AKI, acute cystitis,acute metabolic encephalopathy,and hypokalemia.  FWY:OVZCHYIF, hypertension, depression, CAD status post CABG, hyperlipidemia, type 2 diabetes, fibromyalgia GERD.   OT comments  Patient presents supine in bed - very pleasant but confused and alert to self only. She continues to reports pain in right wrist which is slightly swollen. Patient mod assist to transfer to edge of bed and demonstrated good balance. Performed sit to stand with max assist with walker but unable to take steps. Therapist had patient return to sitting. Attempted second stand patient able to to pivot with mod assist and walker to recliner. Patient assisted with meal set up so she can feed herself in recliner.   Recommendations for follow up therapy are one component of a multi-disciplinary discharge planning process, led by the attending physician.  Recommendations may be updated based on patient status, additional functional criteria and insurance authorization.    Follow Up Recommendations  Skilled nursing-short term rehab (<3 hours/day)    Assistance Recommended at Discharge Frequent or constant Supervision/Assistance  Patient can return home with the following  Two people to help with bathing/dressing/bathroom;Two people to help with walking and/or transfers;Assistance with cooking/housework;Direct supervision/assist for medications management;Assist for transportation;Assistance with feeding;Direct supervision/assist for financial management;Help with stairs or ramp for entrance   Equipment Recommendations  Other (comment) (Defer)    Recommendations for Other Services      Precautions / Restrictions Precautions Precautions: Fall Restrictions Weight Bearing Restrictions: No        Mobility Bed Mobility Overal bed mobility: Needs Assistance Bed Mobility: Supine to Sit     Supine to sit: Mod assist, HOB elevated     General bed mobility comments: assistance for trunk and use of bed pad to pivot hips to edge    Transfers Overall transfer level: Needs assistance Equipment used: Rolling walker (2 wheels) Transfers: Sit to/from Stand, Bed to chair/wheelchair/BSC Sit to Stand: Max assist, From elevated surface          Lateral/Scoot Transfers: Mod assist General transfer comment: Max assist with elevated bed hieght to stand with walker. Stand pivot with mod assist and walker to transfer to recilner. Assistance to scoot back in [place].     Balance Overall balance assessment: Needs assistance Sitting-balance support: No upper extremity supported, Feet supported Sitting balance-Leahy Scale: Good       Standing balance-Leahy Scale: Poor Standing balance comment: reliant on external support                           ADL either performed or assessed with clinical judgement   ADL Overall ADL's : Needs assistance/impaired Eating/Feeding: Set up;Sitting Eating/Feeding Details (indicate cue type and reason): required assistance with tray and encouragement to eat but demonstrated ability to spear food and feed self and drink from coffee cup.                                        Extremity/Trunk Assessment              Vision   Vision Assessment?: No apparent visual deficits   Perception     Praxis      Cognition Arousal/Alertness: Awake/alert Behavior During Therapy: [facility]  for tasks assessed/performed Overall Cognitive Status: History of cognitive impairments - at baseline                                 General Comments: Alert to self only        Exercises      Shoulder Instructions       General Comments      Pertinent Vitals/ Pain       Pain Assessment Pain Assessment: Faces Faces  Pain Scale: Hurts little more Pain Location: R wrist, R LE Pain Descriptors / Indicators: Discomfort, Guarding, Grimacing, Tender Pain Intervention(s): Monitored during session  Home Living                                          Prior Functioning/Environment              Frequency  Min 2X/week        Progress Toward Goals  OT Goals(current goals can now be found in the care plan section)  Progress towards OT goals: Progressing toward goals  Acute Rehab OT Goals OT Goal Formulation: Patient unable to participate in goal setting Time For Goal Achievement: 02/16/22 Potential to Achieve Goals: [place] Discharge plan remains appropriate    Co-evaluation                 AM-PAC OT "6 Clicks" Daily Activity     Outcome Measure   Help from another person eating meals?: A Little Help from another person taking care of personal grooming?: A Little Help from another person toileting, which includes using toliet, bedpan, or urinal?: Total Help from another person bathing (including washing, rinsing, drying)?: A Lot Help from another person to put on and taking off regular upper body clothing?: A Lot Help from another person to put on and taking off regular lower body clothing?: Total 6 Click Score: 12    End of Session Equipment Utilized During Treatment: Rolling walker (2 wheels)  OT Visit Diagnosis: Pain;Other abnormalities of gait and mobility (R26.89);Unsteadiness on feet (R26.81) Pain - Right/Left: Right Pain - part of body: Hand   Activity Tolerance Patient tolerated treatment well   Patient Left in chair;with call bell/phone within reach;with chair alarm set   Nurse Communication Mobility status        Time: 1007-1030 OT Time Calculation (min): 23 min  Charges: OT General Charges $OT Visit: 1 Visit OT Treatments $Therapeutic Activity: 8-22 mins  Gustavo Lah, OTR/L [place]  Office [phone]   [name] 02/04/2022, 12:05 PM

## 2022-02-04 NOTE — Assessment & Plan Note (Signed)
   Transient, resolved

## 2022-02-04 NOTE — Progress Notes (Addendum)
RN made second attempt to contact receiving facility to provide handoff report. Phone rang and rang with no answer.   RN will attempt again before end of shift.  RN attempted to call handoff report to RN at receiving facility. When call was transferred, phone rang multiple times with no answer.   RN will attempt again.

## 2022-02-04 NOTE — Assessment & Plan Note (Signed)
   Continuing home regimen of Coreg and Norvasc

## 2022-02-04 NOTE — TOC Transition Note (Addendum)
Transition of Care [facility]) - CM/SW Discharge Note   Patient Details  Name: [name] MRN: [id number] Date of Birth: 1930/12/19  [facility] CM/SW Contact:  Dessa Phi, RN Phone Number: 02/04/2022, 12:30 PM   Clinical Narrative: d/c today [facility] aware of bed available after 2p-dtr [name] to arrive to hospital prior PTAR. Await rm#,report tel# prior PTAR.Nsg/MD updated.   -2:43p-going to rm#[phone],report tel#[phone].PTAR to be called. -3:40p-d/c summary sent. PTAR called.   Final next level of care: Skilled Nursing Facility Barriers to Discharge: No Barriers Identified   Patient Goals and CMS Choice Patient states their goals for this hospitalization and ongoing recovery are::  (ST SNF) CMS Medicare.gov Compare Post Acute Care list provided to:: Patient Represenative (must comment) ([name](dtr)) Choice offered to / list presented to : Adult Children  Discharge Placement PASRR number recieved: 02/02/22            Patient chooses bed at: [facility] Patient to be transferred to facility by:  Corey Harold) Name of family member notified:  ([name](dtr)) Patient and family notified of of transfer: 02/04/22  Discharge Plan and Services   Discharge Planning Services: CM Consult Post Acute Care Choice: [place]                               Social Determinants of Health (SDOH) Interventions     Readmission Risk Interventions     No data to display

## 2022-02-04 NOTE — Assessment & Plan Note (Signed)
.   Patient  placed on Accu-Cheks before every meal and nightly with sliding scale insulin . Diabetic diet

## 2022-02-05 ENCOUNTER — Telehealth: Payer: Self-pay

## 2022-02-05 NOTE — Telephone Encounter (Signed)
Transition Care Management Unsuccessful Follow-up Telephone Call Date of discharge and from where:  [name] 02/04/2022  Attempts:  1st Attempt  Reason for unsuccessful TCM follow-up call:  No answer/busy

## 2022-02-06 DIAGNOSIS — E1165 Type 2 diabetes mellitus with hyperglycemia: Secondary | ICD-10-CM | POA: Diagnosis not present

## 2022-02-06 DIAGNOSIS — I739 Peripheral vascular disease, unspecified: Secondary | ICD-10-CM | POA: Diagnosis not present

## 2022-02-06 DIAGNOSIS — M6281 Muscle weakness (generalized): Secondary | ICD-10-CM | POA: Diagnosis not present

## 2022-02-06 DIAGNOSIS — F03918 Unspecified dementia, unspecified severity, with other behavioral disturbance: Secondary | ICD-10-CM | POA: Diagnosis not present

## 2022-02-06 DIAGNOSIS — J9601 Acute respiratory failure with hypoxia: Secondary | ICD-10-CM | POA: Diagnosis not present

## 2022-02-06 DIAGNOSIS — M48061 Spinal stenosis, lumbar region without neurogenic claudication: Secondary | ICD-10-CM | POA: Diagnosis not present

## 2022-02-06 DIAGNOSIS — F0390 Unspecified dementia without behavioral disturbance: Secondary | ICD-10-CM | POA: Diagnosis not present

## 2022-02-06 DIAGNOSIS — I700 Atherosclerosis of aorta: Secondary | ICD-10-CM | POA: Diagnosis not present

## 2022-02-06 DIAGNOSIS — G9341 Metabolic encephalopathy: Secondary | ICD-10-CM | POA: Diagnosis not present

## 2022-02-06 DIAGNOSIS — N390 Urinary tract infection, site not specified: Secondary | ICD-10-CM | POA: Diagnosis not present

## 2022-02-06 DIAGNOSIS — E118 Type 2 diabetes mellitus with unspecified complications: Secondary | ICD-10-CM | POA: Diagnosis not present

## 2022-02-06 DIAGNOSIS — E876 Hypokalemia: Secondary | ICD-10-CM | POA: Diagnosis not present

## 2022-02-06 DIAGNOSIS — I10 Essential (primary) hypertension: Secondary | ICD-10-CM | POA: Diagnosis not present

## 2022-02-06 DIAGNOSIS — E785 Hyperlipidemia, unspecified: Secondary | ICD-10-CM | POA: Diagnosis not present

## 2022-02-06 DIAGNOSIS — D649 Anemia, unspecified: Secondary | ICD-10-CM | POA: Diagnosis not present

## 2022-02-09 ENCOUNTER — Other Ambulatory Visit: Payer: Self-pay | Admitting: *Deleted

## 2022-02-09 DIAGNOSIS — F413 Other mixed anxiety disorders: Secondary | ICD-10-CM | POA: Diagnosis not present

## 2022-02-09 DIAGNOSIS — F03918 Unspecified dementia, unspecified severity, with other behavioral disturbance: Secondary | ICD-10-CM | POA: Diagnosis not present

## 2022-02-09 DIAGNOSIS — I10 Essential (primary) hypertension: Secondary | ICD-10-CM | POA: Diagnosis not present

## 2022-02-09 DIAGNOSIS — F03C18 Unspecified dementia, severe, with other behavioral disturbance: Secondary | ICD-10-CM | POA: Diagnosis not present

## 2022-02-09 DIAGNOSIS — G9341 Metabolic encephalopathy: Secondary | ICD-10-CM | POA: Diagnosis not present

## 2022-02-09 DIAGNOSIS — E876 Hypokalemia: Secondary | ICD-10-CM | POA: Diagnosis not present

## 2022-02-09 DIAGNOSIS — N390 Urinary tract infection, site not specified: Secondary | ICD-10-CM | POA: Diagnosis not present

## 2022-02-09 DIAGNOSIS — E1165 Type 2 diabetes mellitus with hyperglycemia: Secondary | ICD-10-CM | POA: Diagnosis not present

## 2022-02-09 DIAGNOSIS — M6281 Muscle weakness (generalized): Secondary | ICD-10-CM | POA: Diagnosis not present

## 2022-02-09 DIAGNOSIS — F5105 Insomnia due to other mental disorder: Secondary | ICD-10-CM | POA: Diagnosis not present

## 2022-02-09 NOTE — Patient Outreach (Signed)
Per [facility] Mrs. Mehrer resides in [facility]. Screening for potential [facility] care coordination services as benefit of insurance plan and PCP.  Secure message sent to [facility] to inquire about transition plans and potential [facility] needs.   Will continue to follow.    [name], MSN, RN,BSN [place] Acute Care Coordinator [phone] (Direct dial)

## 2022-02-10 DIAGNOSIS — F0390 Unspecified dementia without behavioral disturbance: Secondary | ICD-10-CM | POA: Diagnosis not present

## 2022-02-10 DIAGNOSIS — M48061 Spinal stenosis, lumbar region without neurogenic claudication: Secondary | ICD-10-CM | POA: Diagnosis not present

## 2022-02-10 DIAGNOSIS — E785 Hyperlipidemia, unspecified: Secondary | ICD-10-CM | POA: Diagnosis not present

## 2022-02-10 DIAGNOSIS — I10 Essential (primary) hypertension: Secondary | ICD-10-CM | POA: Diagnosis not present

## 2022-02-10 DIAGNOSIS — D649 Anemia, unspecified: Secondary | ICD-10-CM | POA: Diagnosis not present

## 2022-02-10 DIAGNOSIS — G9341 Metabolic encephalopathy: Secondary | ICD-10-CM | POA: Diagnosis not present

## 2022-02-10 DIAGNOSIS — I739 Peripheral vascular disease, unspecified: Secondary | ICD-10-CM | POA: Diagnosis not present

## 2022-02-10 DIAGNOSIS — I700 Atherosclerosis of aorta: Secondary | ICD-10-CM | POA: Diagnosis not present

## 2022-02-10 DIAGNOSIS — E118 Type 2 diabetes mellitus with unspecified complications: Secondary | ICD-10-CM | POA: Diagnosis not present

## 2022-02-10 DIAGNOSIS — J9601 Acute respiratory failure with hypoxia: Secondary | ICD-10-CM | POA: Diagnosis not present

## 2022-02-10 NOTE — Telephone Encounter (Signed)
Transition Care Management Unsuccessful Follow-up Telephone Call  Date of discharge and from where:  [facility] 02/04/2022  Attempts:  2nd Attempt  Reason for unsuccessful TCM follow-up call:  No answer/busy Juanda Crumble, [place] Direct Dial [phone]

## 2022-02-11 DIAGNOSIS — M545 Low back pain, unspecified: Secondary | ICD-10-CM | POA: Diagnosis not present

## 2022-02-11 DIAGNOSIS — E876 Hypokalemia: Secondary | ICD-10-CM | POA: Diagnosis not present

## 2022-02-11 DIAGNOSIS — I10 Essential (primary) hypertension: Secondary | ICD-10-CM | POA: Diagnosis not present

## 2022-02-11 DIAGNOSIS — N390 Urinary tract infection, site not specified: Secondary | ICD-10-CM | POA: Diagnosis not present

## 2022-02-11 DIAGNOSIS — G9341 Metabolic encephalopathy: Secondary | ICD-10-CM | POA: Diagnosis not present

## 2022-02-11 DIAGNOSIS — F03918 Unspecified dementia, unspecified severity, with other behavioral disturbance: Secondary | ICD-10-CM | POA: Diagnosis not present

## 2022-02-11 DIAGNOSIS — E1165 Type 2 diabetes mellitus with hyperglycemia: Secondary | ICD-10-CM | POA: Diagnosis not present

## 2022-02-11 DIAGNOSIS — M6281 Muscle weakness (generalized): Secondary | ICD-10-CM | POA: Diagnosis not present

## 2022-02-12 ENCOUNTER — Encounter: Payer: Medicare Other | Admitting: Family Medicine

## 2022-02-12 DIAGNOSIS — N390 Urinary tract infection, site not specified: Secondary | ICD-10-CM | POA: Diagnosis not present

## 2022-02-12 DIAGNOSIS — I10 Essential (primary) hypertension: Secondary | ICD-10-CM | POA: Diagnosis not present

## 2022-02-12 DIAGNOSIS — M6281 Muscle weakness (generalized): Secondary | ICD-10-CM | POA: Diagnosis not present

## 2022-02-12 DIAGNOSIS — F03918 Unspecified dementia, unspecified severity, with other behavioral disturbance: Secondary | ICD-10-CM | POA: Diagnosis not present

## 2022-02-12 DIAGNOSIS — E1165 Type 2 diabetes mellitus with hyperglycemia: Secondary | ICD-10-CM | POA: Diagnosis not present

## 2022-02-12 DIAGNOSIS — N178 Other acute kidney failure: Secondary | ICD-10-CM | POA: Diagnosis not present

## 2022-02-12 DIAGNOSIS — G9341 Metabolic encephalopathy: Secondary | ICD-10-CM | POA: Diagnosis not present

## 2022-02-12 DIAGNOSIS — E876 Hypokalemia: Secondary | ICD-10-CM | POA: Diagnosis not present

## 2022-02-13 ENCOUNTER — Other Ambulatory Visit: Payer: Self-pay | Admitting: *Deleted

## 2022-02-13 DIAGNOSIS — F03918 Unspecified dementia, unspecified severity, with other behavioral disturbance: Secondary | ICD-10-CM | POA: Diagnosis not present

## 2022-02-13 DIAGNOSIS — F0390 Unspecified dementia without behavioral disturbance: Secondary | ICD-10-CM | POA: Diagnosis not present

## 2022-02-13 DIAGNOSIS — E785 Hyperlipidemia, unspecified: Secondary | ICD-10-CM | POA: Diagnosis not present

## 2022-02-13 DIAGNOSIS — M48061 Spinal stenosis, lumbar region without neurogenic claudication: Secondary | ICD-10-CM | POA: Diagnosis not present

## 2022-02-13 DIAGNOSIS — G9341 Metabolic encephalopathy: Secondary | ICD-10-CM | POA: Diagnosis not present

## 2022-02-13 DIAGNOSIS — I10 Essential (primary) hypertension: Secondary | ICD-10-CM | POA: Diagnosis not present

## 2022-02-13 DIAGNOSIS — E1165 Type 2 diabetes mellitus with hyperglycemia: Secondary | ICD-10-CM | POA: Diagnosis not present

## 2022-02-13 DIAGNOSIS — M6281 Muscle weakness (generalized): Secondary | ICD-10-CM | POA: Diagnosis not present

## 2022-02-13 DIAGNOSIS — E118 Type 2 diabetes mellitus with unspecified complications: Secondary | ICD-10-CM | POA: Diagnosis not present

## 2022-02-13 DIAGNOSIS — E876 Hypokalemia: Secondary | ICD-10-CM | POA: Diagnosis not present

## 2022-02-13 DIAGNOSIS — I739 Peripheral vascular disease, unspecified: Secondary | ICD-10-CM | POA: Diagnosis not present

## 2022-02-13 DIAGNOSIS — N390 Urinary tract infection, site not specified: Secondary | ICD-10-CM | POA: Diagnosis not present

## 2022-02-13 DIAGNOSIS — I700 Atherosclerosis of aorta: Secondary | ICD-10-CM | POA: Diagnosis not present

## 2022-02-13 DIAGNOSIS — J9601 Acute respiratory failure with hypoxia: Secondary | ICD-10-CM | POA: Diagnosis not present

## 2022-02-13 DIAGNOSIS — M545 Low back pain, unspecified: Secondary | ICD-10-CM | POA: Diagnosis not present

## 2022-02-13 DIAGNOSIS — D649 Anemia, unspecified: Secondary | ICD-10-CM | POA: Diagnosis not present

## 2022-02-13 NOTE — Patient Outreach (Signed)
[place] Coordinator follow up. [name] resides in [facility].  Screening for potential [facility] care coordination services as benefit of insurance plan and PCP.  Previous update received from SNF social worker indicating [name] is from [facility].  Will continue to follow.   [name], MSN, RN,BSN [place] Acute Care Coordinator [phone] (Direct dial)

## 2022-02-16 DIAGNOSIS — M6281 Muscle weakness (generalized): Secondary | ICD-10-CM | POA: Diagnosis not present

## 2022-02-16 DIAGNOSIS — G9341 Metabolic encephalopathy: Secondary | ICD-10-CM | POA: Diagnosis not present

## 2022-02-16 DIAGNOSIS — N178 Other acute kidney failure: Secondary | ICD-10-CM | POA: Diagnosis not present

## 2022-02-16 DIAGNOSIS — E876 Hypokalemia: Secondary | ICD-10-CM | POA: Diagnosis not present

## 2022-02-16 DIAGNOSIS — E1165 Type 2 diabetes mellitus with hyperglycemia: Secondary | ICD-10-CM | POA: Diagnosis not present

## 2022-02-16 DIAGNOSIS — N390 Urinary tract infection, site not specified: Secondary | ICD-10-CM | POA: Diagnosis not present

## 2022-02-16 DIAGNOSIS — M545 Low back pain, unspecified: Secondary | ICD-10-CM | POA: Diagnosis not present

## 2022-02-16 DIAGNOSIS — F03918 Unspecified dementia, unspecified severity, with other behavioral disturbance: Secondary | ICD-10-CM | POA: Diagnosis not present

## 2022-02-16 DIAGNOSIS — I10 Essential (primary) hypertension: Secondary | ICD-10-CM | POA: Diagnosis not present

## 2022-02-16 NOTE — Telephone Encounter (Signed)
Transition Care Management Unsuccessful Follow-up Telephone Call  Date of discharge and from where:  [facility] 02/04/2022  Attempts:  3rd Attempt  Reason for unsuccessful TCM follow-up call:  Left voice message Juanda Crumble, [place] Direct Dial [phone]

## 2022-02-17 DIAGNOSIS — G9341 Metabolic encephalopathy: Secondary | ICD-10-CM | POA: Diagnosis not present

## 2022-02-17 DIAGNOSIS — D649 Anemia, unspecified: Secondary | ICD-10-CM | POA: Diagnosis not present

## 2022-02-17 DIAGNOSIS — E118 Type 2 diabetes mellitus with unspecified complications: Secondary | ICD-10-CM | POA: Diagnosis not present

## 2022-02-17 DIAGNOSIS — J9601 Acute respiratory failure with hypoxia: Secondary | ICD-10-CM | POA: Diagnosis not present

## 2022-02-17 DIAGNOSIS — F0390 Unspecified dementia without behavioral disturbance: Secondary | ICD-10-CM | POA: Diagnosis not present

## 2022-02-17 DIAGNOSIS — I739 Peripheral vascular disease, unspecified: Secondary | ICD-10-CM | POA: Diagnosis not present

## 2022-02-17 DIAGNOSIS — E785 Hyperlipidemia, unspecified: Secondary | ICD-10-CM | POA: Diagnosis not present

## 2022-02-17 DIAGNOSIS — I700 Atherosclerosis of aorta: Secondary | ICD-10-CM | POA: Diagnosis not present

## 2022-02-17 DIAGNOSIS — M48061 Spinal stenosis, lumbar region without neurogenic claudication: Secondary | ICD-10-CM | POA: Diagnosis not present

## 2022-02-17 DIAGNOSIS — I10 Essential (primary) hypertension: Secondary | ICD-10-CM | POA: Diagnosis not present

## 2022-02-18 ENCOUNTER — Non-Acute Institutional Stay: Payer: Medicare Other | Admitting: Student

## 2022-02-18 DIAGNOSIS — F01B3 Vascular dementia, moderate, with mood disturbance: Secondary | ICD-10-CM

## 2022-02-18 DIAGNOSIS — M25431 Effusion, right wrist: Secondary | ICD-10-CM

## 2022-02-18 DIAGNOSIS — Z515 Encounter for palliative care: Secondary | ICD-10-CM

## 2022-02-18 DIAGNOSIS — R531 Weakness: Secondary | ICD-10-CM

## 2022-02-18 DIAGNOSIS — M25531 Pain in right wrist: Secondary | ICD-10-CM

## 2022-02-18 NOTE — Progress Notes (Signed)
[facility] Palliative Care Consult Note Telephone: [phone]  Fax: [phone]   Date of encounter: 02/18/22 10:30 PM PATIENT NAME: [name] [id number]   [phone] (home)  DOB: 1930/05/27 MRN: [id number] PRIMARY CARE PROVIDER:    Binnie Rail, MD,  [place] [address] [phone]  REFERRING PROVIDER:   Binnie Rail, MD [address] [place],  [place] [address] [phone]  RESPONSIBLE PARTY:    Contact Information     Name Relation Home Work [place] Daughter   [phone]   [name], Gal   [phone]        I met face to face with patient in the facility. Palliative Care was asked to follow this patient by consultation request of  Susan Boehringer, NP to address advance care planning and complex medical decision making. This is the initial visit.                                     ASSESSMENT AND PLAN / RECOMMENDATIONS:   Advance Care Planning/Goals of Care: Goals include to maximize quality of life and symptom management. Patient/health care surrogate gave his/her permission to discuss.Our advance care planning conversation included a discussion about:    The value and importance of advance care planning  Experiences with loved ones who have been seriously ill or have died  Exploration of personal, cultural or spiritual beliefs that might influence medical decisions  Exploration of goals of care in the event of a sudden injury or illness  CODE STATUS: FULL CODE  Education provided on Palliative Medicine. Left message for patient's daughter, [name] to review goals of care; awaiting return call. Staff was unsure if patient will be remaining at facility long term.  Symptom Management/Plan:  Dementia-staff to assist with adls, reorient and redirect as needed. Continue donepezil, memantine, Depakote and quetiapine as directed. Monitor  for falls/safety.  Generalized weakness-continue therapy as directed. W/c used for locomotion.   Right wrist pain, swelling-PCP ordered x-ray. Continue acetaminophen, meloxicam as ordered.   Follow up Palliative Care Visit: Palliative care will continue to follow for complex medical decision making, advance care planning, and clarification of goals. Return in 4 weeks or prn.   This visit was coded based on medical decision making (MDM).  PPS: 40%  HOSPICE ELIGIBILITY/DIAGNOSIS: TBD  Chief Complaint: Palliative Medicine initial consult.   HISTORY OF PRESENT ILLNESS:  [name] is a 88 y.o. year old female  with dementia, T2DM, debility, hypertension. Patient recently hospitalized 32/99-24/26/83 due to metabolic encephalopathy, dementia, T2DM, UTI.  Patient currently at [facility]. She is receiving skilled therapy. She c/o right wrist pain, bilateral knee pain. Patient denies any falls or injury; none reported per staff. Patient endorses good appetite. Denies any other complaints. Patient received up to w/c, pedaling self about her room. She does have swelling to right wrist, tenderness noted upon palpation.   History obtained from review of EMR, discussion with primary team, and interview with family, facility staff/caregiver and/or [name].  I reviewed available labs, medications, imaging, studies and related documents from the EMR.  Records reviewed and summarized above.   ROS  Patient and staff contribute d/t her dementia.  Physical Exam: Weight: 124.4 pounds Constitutional: NAD General: frail appearing, thin EYES: anicteric sclera, lids intact, no discharge  ENMT:  intact hearing, oral mucous membranes moist, dentition intact CV: S1S2, RRR, no LE edema Pulmonary: LCTA, no increased work of breathing, no cough, room air Abdomen: normo-active BS + 4 quadrants, soft and non tender GU: deferred MSK: moves all extremities, right wrist swelling Skin:  warm and dry, no rashes or wounds on visible skin Neuro:  + generalized weakness,  + cognitive impairment Psych: non-anxious affect, A and O to person Hem/lymph/immuno: no widespread bruising CURRENT PROBLEM LIST:  Patient Active Problem List   Diagnosis Date Noted   Acute cystitis without hematuria 02/04/2022   Osteoarthritis 02/02/2022   AKI (acute kidney injury) ([place]) 01/31/2022   UTI (urinary tract infection) 56/31/4970   Acute metabolic encephalopathy 26/37/8588   Hypoxia 01/31/2022   Hypokalemia 01/31/2022   Type 2 diabetes mellitus ([place]) 01/31/2022   Acute respiratory failure with hypoxia ([place]) 01/30/2022   Dehydration 01/30/2022   Drug reaction 01/30/2022   Encounter for therapeutic drug level monitoring 01/13/2022   Encounter for testing for latent tuberculosis 01/13/2022   Thrombocytopenia ([place]) 01/08/2022   Delirium with dementia 01/08/2022   Palliative care encounter 09/21/2021   Urinary frequency 09/18/2020   Aortic atherosclerosis ([place]) 08/07/2020   Dementia with behavioral disturbance ([place]) 07/27/2017   Chronic RUQ pain 05/28/2016   Right knee pain 06/14/2015   Diabetic neuropathy ([place]) 03/21/2014   Bunion 03/21/2014   PVD (pulmonary valve disease) 01/23/2014   Onychomycosis 12/20/2013   PVD (peripheral vascular disease) ([place]) 12/19/2013   Gout 12/13/2013   Depression    Lumbar canal stenosis 05/26/2013   Neuralgia neuritis, sciatic nerve 05/26/2013   Degenerative arthritis of lumbar spine with cord compression 04/24/2013   Arthritis of shoulder region, left 09/11/2011   Type II diabetes mellitus with neurological manifestations ([place]) 01/17/2009   Anemia 10/15/2008   CAD, NATIVE VESSEL 06/09/2008   Hyperlipidemia 06/15/2007   Essential hypertension 06/15/2007   GERD 06/15/2007   TUBULOVILLOUS ADENOMA, 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   DIVERTICULOSIS, 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   PAST MEDICAL HISTORY:  Active Ambulatory Problems    Diagnosis Date Noted   TUBULOVILLOUS ADENOMA,  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   Type II diabetes mellitus with neurological manifestations ([place]) 01/17/2009   Hyperlipidemia 06/15/2007   Anemia 10/15/2008   Essential hypertension 06/15/2007   CAD, NATIVE VESSEL 06/09/2008   GERD 06/15/2007   DIVERTICULOSIS, 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   Arthritis of shoulder region, left 09/11/2011   Degenerative arthritis of lumbar spine with cord compression 04/24/2013   Depression    Gout 12/13/2013   PVD (peripheral vascular disease) ([place]) 12/19/2013   Onychomycosis 12/20/2013   PVD (pulmonary valve disease) 01/23/2014   Diabetic neuropathy ([place]) 03/21/2014   Bunion 03/21/2014   Right knee pain 06/14/2015   Chronic RUQ pain 05/28/2016   Dementia with behavioral disturbance ([place]) 07/27/2017   Aortic atherosclerosis ([place]) 08/07/2020   Urinary frequency 09/18/2020   Lumbar canal stenosis 05/26/2013   Neuralgia neuritis, sciatic nerve 05/26/2013   Palliative care encounter 09/21/2021   Thrombocytopenia ([place]) 01/08/2022   Delirium with dementia 01/08/2022   Encounter for therapeutic drug level monitoring 01/13/2022   Encounter for testing for latent tuberculosis 01/13/2022   Acute respiratory failure with hypoxia ([place]) 01/30/2022   Dehydration 01/30/2022   Drug reaction 01/30/2022   AKI (acute kidney injury) ([place]) 01/31/2022   UTI (urinary tract infection) 50/27/7412   Acute metabolic encephalopathy 87/86/7672   Hypoxia 01/31/2022   Hypokalemia 01/31/2022   Type 2 diabetes mellitus ([place]) 01/31/2022   Osteoarthritis 02/02/2022   Acute cystitis without hematuria 02/04/2022  Resolved Ambulatory Problems    Diagnosis Date Noted   Dermatophytosis of nail 02/20/2009   HEMORRHOIDS, INTERNAL 03/02/2007   GASTRITIS 03/12/2009   HIATAL HERNIA 10/29/2000   UNSPECIFIED HEMORRHAGE OF GASTROINTESTINAL TRACT 12/21/2008   DRY SKIN 02/20/2009   ARTHRITIS 06/15/2007   Enthesopathy of hip region 11/28/2009   Fibromyalgia syndrome 06/15/2007   INSOMNIA-SLEEP  DISORDER-UNSPEC 09/18/2009   Headache(784.0) 04/08/2009   PALPITATIONS, RECURRENT 04/15/2009   Wheezing 09/18/2009   FECAL OCCULT BLOOD 01/16/2009   COLONIC POLYPS, HX OF 09/18/2008   Memory loss 07/23/2010   Palpitations 07/06/2012   Sciatica 09/05/2012   Sacroiliac joint dysfunction of right side 03/22/2013   Non-compliant behavior 12/05/2013   Radiculopathy of lumbar region 01/31/2014   HAV (hallux abducto valgus) 03/21/2014   Tenosynovitis of ankle 03/21/2014   Pronation deformity of ankle, acquired 03/21/2014   Chest pain 04/17/2014   Pain in lower limb 07/18/2014   Non-compliant behavior 11/23/2014   Acute bronchitis 12/03/2014   Cough 05/02/2015   Abdominal pain, epigastric 09/02/2015   AP (abdominal pain) 09/02/2015   Mild cognitive impairment 12/12/2015   Buttock pain 10/30/2016   Abscess of right foot 12/02/2017   Cellulitis of foot 12/05/2017   Benign hypertension 01/04/2014   Past Medical History:  Diagnosis Date   DIABETES MELLITUS, TYPE II    FIBROMYALGIA    Gout    HYPERLIPIDEMIA    HYPERTENSION    Insomnia    OA (osteoarthritis)    S/P CABG x 1    S/P inguinal hernia repair    UNSPECIFIED ANEMIA    SOCIAL HX:  Social History   Tobacco Use   Smoking status: Former    Types: Cigarettes    Quit date: 04/26/1970    Years since quitting: 51.8   Smokeless tobacco: Never  Substance Use Topics   Alcohol use: Not Currently    Alcohol/week: 1.0 standard drink of alcohol    Types: 1 Glasses of wine per week   FAMILY HX:  Family History  Problem Relation Age of Onset   Cancer Mother        Stomach   Stomach cancer Mother 30   Hypertension Mother    Heart disease Father 42       MI   Diabetes Father    Heart attack Father    Vascular Disease Sister    Stroke Sister    Dementia Sister    Diabetes Sister    Parkinsonism Sister    Stroke Sister    Heart disease Sister    Heart attack Brother    Stroke Daughter    Diabetes Daughter     Hypertension Daughter    Colon cancer Neg Hx       ALLERGIES: No Known Allergies   PERTINENT MEDICATIONS:  Outpatient Encounter Medications as of 02/18/2022  Medication Sig   acetaminophen (TYLENOL) 500 MG tablet Take 1,000 mg by mouth 2 (two) times daily.    amLODipine (NORVASC) 5 MG tablet TAKE ONE TABLET BY MOUTH TWICE A DAY   aspirin EC 81 MG tablet Take 81 mg by mouth daily.   busPIRone (BUSPAR) 15 MG tablet Take 1 tablet (15 mg total) by mouth 2 (two) times daily.   carvedilol (COREG) 6.25 MG tablet TAKE ONE TABLET BY MOUTH TWICE A DAY   cephALEXin (KEFLEX) 500 MG capsule Take 1 capsule (500 mg total) by mouth every 12 (twelve) hours.   cetirizine (ZYRTEC) 10 MG tablet TAKE ONE TABLET BY MOUTH DAILY (Patient  taking differently: Take 10 mg by mouth at bedtime.)   Cholecalciferol (D3-1000) 25 MCG (1000 UT) capsule Take 1,000 Units by mouth daily.   divalproex (DEPAKOTE) 125 MG DR tablet Take 1 tablet (125 mg total) by mouth 2 (two) times daily. Please administer this course of Depakote second   [START ON 02/28/2022] divalproex (DEPAKOTE) 125 MG DR tablet Take 1 tablet (125 mg total) by mouth at bedtime. Please administer this course of Depakote third   divalproex (DEPAKOTE) 250 MG DR tablet Take 1 tablet (250 mg total) by mouth 2 (two) times daily. Please administer this course of Depakote first   donepezil (ARICEPT) 10 MG tablet TAKE ONE TABLET BY MOUTH DAILY   ferrous sulfate 324 MG TBEC Take 324 mg by mouth daily.   gabapentin (NEURONTIN) 300 MG capsule TAKE ONE CAPSULE BY MOUTH EVERY NIGHT AT BEDTIME   MELATONIN PO Take 3 mg by mouth at bedtime.   meloxicam (MOBIC) 7.5 MG tablet TAKE ONE TABLET BY MOUTH DAILY   memantine (NAMENDA) 10 MG tablet Take 1 tablet (10 mg total) by mouth 2 (two) times daily.   omeprazole (PRILOSEC) 40 MG capsule TAKE ONE CAPSULE BY MOUTH DAILY   QUEtiapine (SEROQUEL) 50 MG tablet Take 1 tablet (50 mg total) by mouth at bedtime.   No facility-administered  encounter medications on file as of 02/18/2022.   Thank you for the opportunity to participate in the care of Ms. Rubel.  The palliative care team will continue to follow. Please call our office at [phone] if we can be of additional assistance.   Ezekiel Slocumb, NP   COVID-19 PATIENT SCREENING TOOL Asked and negative response unless otherwise noted:  Have you had symptoms of covid, tested positive or been in contact with someone with symptoms/positive test in the past 5-10 days? No

## 2022-02-20 DIAGNOSIS — E118 Type 2 diabetes mellitus with unspecified complications: Secondary | ICD-10-CM | POA: Diagnosis not present

## 2022-02-20 DIAGNOSIS — M48061 Spinal stenosis, lumbar region without neurogenic claudication: Secondary | ICD-10-CM | POA: Diagnosis not present

## 2022-02-20 DIAGNOSIS — I700 Atherosclerosis of aorta: Secondary | ICD-10-CM | POA: Diagnosis not present

## 2022-02-20 DIAGNOSIS — M6281 Muscle weakness (generalized): Secondary | ICD-10-CM | POA: Diagnosis not present

## 2022-02-20 DIAGNOSIS — E876 Hypokalemia: Secondary | ICD-10-CM | POA: Diagnosis not present

## 2022-02-20 DIAGNOSIS — I739 Peripheral vascular disease, unspecified: Secondary | ICD-10-CM | POA: Diagnosis not present

## 2022-02-20 DIAGNOSIS — J9601 Acute respiratory failure with hypoxia: Secondary | ICD-10-CM | POA: Diagnosis not present

## 2022-02-20 DIAGNOSIS — G9341 Metabolic encephalopathy: Secondary | ICD-10-CM | POA: Diagnosis not present

## 2022-02-20 DIAGNOSIS — E1165 Type 2 diabetes mellitus with hyperglycemia: Secondary | ICD-10-CM | POA: Diagnosis not present

## 2022-02-20 DIAGNOSIS — N390 Urinary tract infection, site not specified: Secondary | ICD-10-CM | POA: Diagnosis not present

## 2022-02-20 DIAGNOSIS — E785 Hyperlipidemia, unspecified: Secondary | ICD-10-CM | POA: Diagnosis not present

## 2022-02-20 DIAGNOSIS — I10 Essential (primary) hypertension: Secondary | ICD-10-CM | POA: Diagnosis not present

## 2022-02-20 DIAGNOSIS — F03918 Unspecified dementia, unspecified severity, with other behavioral disturbance: Secondary | ICD-10-CM | POA: Diagnosis not present

## 2022-02-20 DIAGNOSIS — M545 Low back pain, unspecified: Secondary | ICD-10-CM | POA: Diagnosis not present

## 2022-02-20 DIAGNOSIS — D649 Anemia, unspecified: Secondary | ICD-10-CM | POA: Diagnosis not present

## 2022-02-20 DIAGNOSIS — N178 Other acute kidney failure: Secondary | ICD-10-CM | POA: Diagnosis not present

## 2022-02-20 DIAGNOSIS — M25531 Pain in right wrist: Secondary | ICD-10-CM | POA: Diagnosis not present

## 2022-02-20 DIAGNOSIS — F0390 Unspecified dementia without behavioral disturbance: Secondary | ICD-10-CM | POA: Diagnosis not present

## 2022-02-23 DIAGNOSIS — F03C18 Unspecified dementia, severe, with other behavioral disturbance: Secondary | ICD-10-CM | POA: Diagnosis not present

## 2022-02-23 DIAGNOSIS — F5105 Insomnia due to other mental disorder: Secondary | ICD-10-CM | POA: Diagnosis not present

## 2022-02-23 DIAGNOSIS — F39 Unspecified mood [affective] disorder: Secondary | ICD-10-CM | POA: Diagnosis not present

## 2022-02-23 DIAGNOSIS — F413 Other mixed anxiety disorders: Secondary | ICD-10-CM | POA: Diagnosis not present

## 2022-02-24 DIAGNOSIS — E785 Hyperlipidemia, unspecified: Secondary | ICD-10-CM | POA: Diagnosis not present

## 2022-02-24 DIAGNOSIS — J9601 Acute respiratory failure with hypoxia: Secondary | ICD-10-CM | POA: Diagnosis not present

## 2022-02-24 DIAGNOSIS — E118 Type 2 diabetes mellitus with unspecified complications: Secondary | ICD-10-CM | POA: Diagnosis not present

## 2022-02-24 DIAGNOSIS — I739 Peripheral vascular disease, unspecified: Secondary | ICD-10-CM | POA: Diagnosis not present

## 2022-02-24 DIAGNOSIS — I700 Atherosclerosis of aorta: Secondary | ICD-10-CM | POA: Diagnosis not present

## 2022-02-24 DIAGNOSIS — F0390 Unspecified dementia without behavioral disturbance: Secondary | ICD-10-CM | POA: Diagnosis not present

## 2022-02-24 DIAGNOSIS — M48061 Spinal stenosis, lumbar region without neurogenic claudication: Secondary | ICD-10-CM | POA: Diagnosis not present

## 2022-02-24 DIAGNOSIS — I10 Essential (primary) hypertension: Secondary | ICD-10-CM | POA: Diagnosis not present

## 2022-02-24 DIAGNOSIS — D649 Anemia, unspecified: Secondary | ICD-10-CM | POA: Diagnosis not present

## 2022-02-24 DIAGNOSIS — G9341 Metabolic encephalopathy: Secondary | ICD-10-CM | POA: Diagnosis not present

## 2022-02-25 DIAGNOSIS — F03918 Unspecified dementia, unspecified severity, with other behavioral disturbance: Secondary | ICD-10-CM | POA: Diagnosis not present

## 2022-02-25 DIAGNOSIS — M25531 Pain in right wrist: Secondary | ICD-10-CM | POA: Diagnosis not present

## 2022-02-25 DIAGNOSIS — E876 Hypokalemia: Secondary | ICD-10-CM | POA: Diagnosis not present

## 2022-02-25 DIAGNOSIS — M545 Low back pain, unspecified: Secondary | ICD-10-CM | POA: Diagnosis not present

## 2022-02-25 DIAGNOSIS — M6281 Muscle weakness (generalized): Secondary | ICD-10-CM | POA: Diagnosis not present

## 2022-02-25 DIAGNOSIS — I10 Essential (primary) hypertension: Secondary | ICD-10-CM | POA: Diagnosis not present

## 2022-02-25 DIAGNOSIS — E1165 Type 2 diabetes mellitus with hyperglycemia: Secondary | ICD-10-CM | POA: Diagnosis not present

## 2022-02-25 DIAGNOSIS — G9341 Metabolic encephalopathy: Secondary | ICD-10-CM | POA: Diagnosis not present

## 2022-02-25 DIAGNOSIS — N178 Other acute kidney failure: Secondary | ICD-10-CM | POA: Diagnosis not present

## 2022-02-25 DIAGNOSIS — N390 Urinary tract infection, site not specified: Secondary | ICD-10-CM | POA: Diagnosis not present

## 2022-03-02 DIAGNOSIS — M4726 Other spondylosis with radiculopathy, lumbar region: Secondary | ICD-10-CM | POA: Diagnosis not present

## 2022-03-02 DIAGNOSIS — I10 Essential (primary) hypertension: Secondary | ICD-10-CM | POA: Diagnosis not present

## 2022-03-02 DIAGNOSIS — F32A Depression, unspecified: Secondary | ICD-10-CM | POA: Diagnosis not present

## 2022-03-02 DIAGNOSIS — I2510 Atherosclerotic heart disease of native coronary artery without angina pectoris: Secondary | ICD-10-CM | POA: Diagnosis not present

## 2022-03-02 DIAGNOSIS — K219 Gastro-esophageal reflux disease without esophagitis: Secondary | ICD-10-CM | POA: Diagnosis not present

## 2022-03-02 DIAGNOSIS — M5430 Sciatica, unspecified side: Secondary | ICD-10-CM | POA: Diagnosis not present

## 2022-03-02 DIAGNOSIS — G4700 Insomnia, unspecified: Secondary | ICD-10-CM | POA: Diagnosis not present

## 2022-03-02 DIAGNOSIS — M797 Fibromyalgia: Secondary | ICD-10-CM | POA: Diagnosis not present

## 2022-03-02 DIAGNOSIS — E1140 Type 2 diabetes mellitus with diabetic neuropathy, unspecified: Secondary | ICD-10-CM | POA: Diagnosis not present

## 2022-03-02 DIAGNOSIS — M19012 Primary osteoarthritis, left shoulder: Secondary | ICD-10-CM | POA: Diagnosis not present

## 2022-03-02 DIAGNOSIS — M25531 Pain in right wrist: Secondary | ICD-10-CM | POA: Diagnosis not present

## 2022-03-02 DIAGNOSIS — E1151 Type 2 diabetes mellitus with diabetic peripheral angiopathy without gangrene: Secondary | ICD-10-CM | POA: Diagnosis not present

## 2022-03-02 DIAGNOSIS — F01B3 Vascular dementia, moderate, with mood disturbance: Secondary | ICD-10-CM | POA: Diagnosis not present

## 2022-03-02 DIAGNOSIS — M109 Gout, unspecified: Secondary | ICD-10-CM | POA: Diagnosis not present

## 2022-03-02 DIAGNOSIS — D126 Benign neoplasm of colon, unspecified: Secondary | ICD-10-CM | POA: Diagnosis not present

## 2022-03-02 DIAGNOSIS — F01B2 Vascular dementia, moderate, with psychotic disturbance: Secondary | ICD-10-CM | POA: Diagnosis not present

## 2022-03-02 DIAGNOSIS — D649 Anemia, unspecified: Secondary | ICD-10-CM | POA: Diagnosis not present

## 2022-03-02 DIAGNOSIS — M48061 Spinal stenosis, lumbar region without neurogenic claudication: Secondary | ICD-10-CM | POA: Diagnosis not present

## 2022-03-02 DIAGNOSIS — M21619 Bunion of unspecified foot: Secondary | ICD-10-CM | POA: Diagnosis not present

## 2022-03-02 DIAGNOSIS — Z87440 Personal history of urinary (tract) infections: Secondary | ICD-10-CM | POA: Diagnosis not present

## 2022-03-02 DIAGNOSIS — E785 Hyperlipidemia, unspecified: Secondary | ICD-10-CM | POA: Diagnosis not present

## 2022-03-02 DIAGNOSIS — N179 Acute kidney failure, unspecified: Secondary | ICD-10-CM | POA: Diagnosis not present

## 2022-03-02 DIAGNOSIS — E876 Hypokalemia: Secondary | ICD-10-CM | POA: Diagnosis not present

## 2022-03-02 DIAGNOSIS — R410 Disorientation, unspecified: Secondary | ICD-10-CM | POA: Diagnosis not present

## 2022-03-02 DIAGNOSIS — M769 Unspecified enthesopathy, lower limb, excluding foot: Secondary | ICD-10-CM | POA: Diagnosis not present

## 2022-03-03 ENCOUNTER — Other Ambulatory Visit: Payer: Self-pay | Admitting: *Deleted

## 2022-03-03 DIAGNOSIS — I10 Essential (primary) hypertension: Secondary | ICD-10-CM | POA: Diagnosis not present

## 2022-03-03 DIAGNOSIS — F01B3 Vascular dementia, moderate, with mood disturbance: Secondary | ICD-10-CM | POA: Diagnosis not present

## 2022-03-03 DIAGNOSIS — E1140 Type 2 diabetes mellitus with diabetic neuropathy, unspecified: Secondary | ICD-10-CM | POA: Diagnosis not present

## 2022-03-03 DIAGNOSIS — E1151 Type 2 diabetes mellitus with diabetic peripheral angiopathy without gangrene: Secondary | ICD-10-CM | POA: Diagnosis not present

## 2022-03-03 DIAGNOSIS — F01B2 Vascular dementia, moderate, with psychotic disturbance: Secondary | ICD-10-CM | POA: Diagnosis not present

## 2022-03-03 DIAGNOSIS — I2510 Atherosclerotic heart disease of native coronary artery without angina pectoris: Secondary | ICD-10-CM | POA: Diagnosis not present

## 2022-03-03 NOTE — Patient Outreach (Signed)
THN Post- Acute Care Coordinator follow up. Verified in [facility], Mrs. [name] discharged from [facility] on 02/26/22.   Mrs. [name] returned to [facility].   No identifiable THN care coordination needs at this time.   [name], MSN, RN,BSN [place] Acute Care Coordinator [phone] (Direct dial)

## 2022-03-04 ENCOUNTER — Telehealth: Payer: Self-pay | Admitting: Internal Medicine

## 2022-03-04 NOTE — Telephone Encounter (Signed)
Okay for orders? 

## 2022-03-04 NOTE — Telephone Encounter (Signed)
VERBAL ORDERS FOR OT  Melissa at [facility]  Call her directly to give verbal orders for occupational therapy.

## 2022-03-04 NOTE — Telephone Encounter (Signed)
Verbals left today on voicemail for patient.

## 2022-03-13 DIAGNOSIS — I2510 Atherosclerotic heart disease of native coronary artery without angina pectoris: Secondary | ICD-10-CM | POA: Diagnosis not present

## 2022-03-13 DIAGNOSIS — F01B3 Vascular dementia, moderate, with mood disturbance: Secondary | ICD-10-CM | POA: Diagnosis not present

## 2022-03-13 DIAGNOSIS — I10 Essential (primary) hypertension: Secondary | ICD-10-CM | POA: Diagnosis not present

## 2022-03-13 DIAGNOSIS — F01B2 Vascular dementia, moderate, with psychotic disturbance: Secondary | ICD-10-CM | POA: Diagnosis not present

## 2022-03-13 DIAGNOSIS — E1151 Type 2 diabetes mellitus with diabetic peripheral angiopathy without gangrene: Secondary | ICD-10-CM | POA: Diagnosis not present

## 2022-03-13 DIAGNOSIS — E1140 Type 2 diabetes mellitus with diabetic neuropathy, unspecified: Secondary | ICD-10-CM | POA: Diagnosis not present

## 2022-03-16 ENCOUNTER — Non-Acute Institutional Stay: Payer: Medicare Other | Admitting: Family Medicine

## 2022-03-16 ENCOUNTER — Encounter: Payer: Self-pay | Admitting: Family Medicine

## 2022-03-16 VITALS — BP 120/60 | HR 80 | Temp 99.6°F | Resp 18

## 2022-03-16 DIAGNOSIS — R8290 Unspecified abnormal findings in urine: Secondary | ICD-10-CM

## 2022-03-16 DIAGNOSIS — M25531 Pain in right wrist: Secondary | ICD-10-CM

## 2022-03-16 DIAGNOSIS — F03918 Unspecified dementia, unspecified severity, with other behavioral disturbance: Secondary | ICD-10-CM | POA: Diagnosis not present

## 2022-03-16 NOTE — Progress Notes (Unsigned)
[facility] Consult Note Telephone: [phone]  Fax: [phone]    Date of encounter: 03/16/22 4:52 PM PATIENT NAME: [name] [place] [address]   [phone] (home)  DOB: 01-Dec-1930 MRN: [id number] PRIMARY CARE PROVIDER:    Binnie Rail, MD,  [place] [address] [phone]  REFERRING PROVIDER:   Binnie Rail, MD [address] [place],  [place] [address] [phone]  RESPONSIBLE PARTY:    Contact Information     Name Relation Home Work [place] Daughter   [phone]   Taj, Arteaga   [phone]        I met face to face with patient in [facility] facility. Palliative Care was asked to follow this patient by consultation request of  Burns, Claudina Lick, MD to address advance care planning and complex medical decision making. This is a follow up visit   ASSESSMENT , SYMPTOM MANAGEMENT AND PLAN / RECOMMENDATIONS:  Likely UTI-Cefdinir 300 mg BID x 5 days Agitation 1/2 of 0/5 mg tab daily in am Pseudogout flare right wrist- Pred 60 mg daily x 2 days, 40 mg daily x 2 days, then 20 mg daily x 2 days, 10 mg daily x 2 days.  Advance Care Planning/Goals of Care: Goals include to maximize quality of life and symptom management. Patient/health care surrogate gave his/her permission to discuss. Our advance care planning conversation included a discussion about:    The value and importance of advance care planning  Experiences with loved ones who have been seriously ill or have died  Exploration of personal, cultural or spiritual beliefs that might influence medical decisions  Exploration of goals of care in the event of a sudden injury or illness  Identification of a healthcare agent  Review and updating or creation of an  advance directive document . Decision not to resuscitate or to de-escalate disease focused treatments due to poor  prognosis. CODE STATUS:      Follow up Palliative Care Visit: Palliative care will continue to follow for complex medical decision making, advance care planning, and clarification of goals. Return *** weeks or prn.  I  This visit was coded based on medical decision making (MDM).  PPS: ***0%  HOSPICE ELIGIBILITY/DIAGNOSIS: TBD  Chief Complaint: ***  HISTORY OF PRESENT ILLNESS:  [name] is a 88 y.o. year old female with *** .   History obtained from review of EMR, discussion with primary team, and interview with family, facility staff/caregiver and/or [name].  I reviewed EMR for available labs, medications, imaging, studies and related documents.  There are no new records since last visit/Records reviewed and summarized above.   ROS General: NAD EYES: denies vision changes ENMT: denies dysphagia Cardiovascular: denies chest pain, denies DOE Pulmonary: denies cough, denies increased SOB Abdomen: endorses good appetite, denies constipation, endorses continence of bowel GU: denies dysuria, endorses continence of urine MSK:  denies increased weakness,  no falls reported Skin: denies rashes or wounds Neurological: denies pain, denies insomnia Psych: Endorses positive mood Heme/lymph/immuno: denies bruises, abnormal bleeding  Physical Exam: Current and past weights: Constitutional: NAD General: frail appearing, thin  CV: S1S2, RRR, no LE edema Pulmonary: CTAB, no increased work of breathing, no cough, room air Abdomen: normo-active BS + 4 quadrants, soft and non tender GU: deferred MSK: no sarcopenia, moves all extremities, ambulatory with rollator.  Right wrist and hand edematous and warm, no visible erythema  Skin: warm and dry, no rashes or wounds on visible skin Neuro:  no generalized weakness, no cognitive impairment Psych: non-anxious affect, A and O to self Hem/lymph/immuno: no widespread bruising   Thank you for the opportunity to participate in  the care of Ms. Knapke.  The palliative care team will continue to follow. Please call our office at [phone] if we can be of additional assistance.   Marijo Conception, FNP -C  COVID-19 PATIENT SCREENING TOOL Asked and negative response unless otherwise noted:   Have you had symptoms of covid, tested positive or been in contact with someone with symptoms/positive test in the past 5-10 days?

## 2022-03-18 ENCOUNTER — Encounter: Payer: Self-pay | Admitting: Internal Medicine

## 2022-03-18 DIAGNOSIS — R8290 Unspecified abnormal findings in urine: Secondary | ICD-10-CM | POA: Insufficient documentation

## 2022-03-18 DIAGNOSIS — M25531 Pain in right wrist: Secondary | ICD-10-CM

## 2022-03-18 HISTORY — DX: Pain in right wrist: M25.531

## 2022-03-18 NOTE — Progress Notes (Unsigned)
Virtual Visit via Video Note  I connected with [name] on 03/19/22 at 11:00 AM EST by a video enabled telemedicine application and verified that I am speaking with the correct person using two identifiers.   I discussed the limitations of evaluation and management by telemedicine and the availability of in person appointments. The patient expressed understanding and agreed to proceed.  Present for the visit:  Myself, Dr Billey Gosling, Syliva Overman and her daughter.  The patient is currently at home and I am in the office.    No referring provider.    History of Present Illness: She is here for follow up of her chronic medical conditions, including htn, hld, DM, gerd, depression, dementia, chronic back pain.    Her daughter is present and provides the history. She is currently in a memory care unit.    Admitted in October for acute metabolic encephalopathy.   Likely related to UTI, dehydration and depakoate.  Depakoate tapered.  Has palliative care nurse coming and she saw her a couple of days ago.  She did have some wrist inflammation looks like a flareup of pseudogout and started her on prednisone.  She also has a possible UTI and started her on an antibiotic.  Patient states she is feeling a little bit better.  Her sleep is good, appetite is good and eating well.  Memory is progressive.  Needs more assistance with ADLs.  Uses rollator.    Doing PT once a week by [facility].    Social History   Socioeconomic History   Marital status: Widowed    Spouse name: Not on file   Number of children: Not on file   Years of education: Not on file   Highest education level: Not on file  Occupational History   Not on file  Tobacco Use   Smoking status: Former    Types: Cigarettes    Quit date: 04/26/1970    Years since quitting: 51.9   Smokeless tobacco: Never  Vaping Use   Vaping Use: Never used  Substance and Sexual Activity   Alcohol use: Not Currently    Alcohol/week:  1.0 standard drink of alcohol    Types: 1 Glasses of wine per week   Drug use: No   Sexual activity: Not on file  Other Topics Concern   Not on file  Social History Narrative   Widows, lives alone. 2 sons. 2 daughters. Retired Ambulance person   Right handed   Occasionally caffeine   Social Determinants of Radio broadcast assistant Strain: Not on file  Food Insecurity: No Food Insecurity (01/31/2022)   Hunger Vital Sign    Worried About Running Out of Food in the Last Year: Never true    [place] in the Last Year: Never true  Transportation Needs: No Transportation Needs (01/31/2022)   PRAPARE - Hydrologist (Medical): No    Lack of Transportation (Non-Medical): No  Physical Activity: Not on file  Stress: Not on file  Social Connections: Not on file     Observations/Objective: Appears well in NAD Breathing normally Skin appears warm and dry  Assessment and Plan:  See Problem List for Assessment and Plan of chronic medical problems.   Refill sent to pharmacy.  Daughter may transition her to PCP where she is at which would be more convenient since it is difficult to get her out to appointments, which I do think would be best for her and  easiest for her daughter  Follow Up Instructions:    I discussed the assessment and treatment plan with the patient. The patient was provided an opportunity to ask questions and all were answered. The patient agreed with the plan and demonstrated an understanding of the instructions.   The patient was advised to call back or seek an in-person evaluation if the symptoms worsen or if the condition fails to improve as anticipated.    Binnie Rail, MD

## 2022-03-19 ENCOUNTER — Telehealth (INDEPENDENT_AMBULATORY_CARE_PROVIDER_SITE_OTHER): Payer: Medicare Other | Admitting: Internal Medicine

## 2022-03-19 DIAGNOSIS — D509 Iron deficiency anemia, unspecified: Secondary | ICD-10-CM | POA: Diagnosis not present

## 2022-03-19 DIAGNOSIS — M25531 Pain in right wrist: Secondary | ICD-10-CM | POA: Diagnosis not present

## 2022-03-19 DIAGNOSIS — I25810 Atherosclerosis of coronary artery bypass graft(s) without angina pectoris: Secondary | ICD-10-CM | POA: Diagnosis not present

## 2022-03-19 DIAGNOSIS — M4716 Other spondylosis with myelopathy, lumbar region: Secondary | ICD-10-CM

## 2022-03-19 DIAGNOSIS — E1140 Type 2 diabetes mellitus with diabetic neuropathy, unspecified: Secondary | ICD-10-CM | POA: Diagnosis not present

## 2022-03-19 DIAGNOSIS — E782 Mixed hyperlipidemia: Secondary | ICD-10-CM

## 2022-03-19 DIAGNOSIS — F3289 Other specified depressive episodes: Secondary | ICD-10-CM | POA: Diagnosis not present

## 2022-03-19 DIAGNOSIS — E1151 Type 2 diabetes mellitus with diabetic peripheral angiopathy without gangrene: Secondary | ICD-10-CM | POA: Diagnosis not present

## 2022-03-19 DIAGNOSIS — E1149 Type 2 diabetes mellitus with other diabetic neurological complication: Secondary | ICD-10-CM | POA: Diagnosis not present

## 2022-03-19 DIAGNOSIS — F01B3 Vascular dementia, moderate, with mood disturbance: Secondary | ICD-10-CM | POA: Diagnosis not present

## 2022-03-19 DIAGNOSIS — F03918 Unspecified dementia, unspecified severity, with other behavioral disturbance: Secondary | ICD-10-CM

## 2022-03-19 DIAGNOSIS — K219 Gastro-esophageal reflux disease without esophagitis: Secondary | ICD-10-CM | POA: Diagnosis not present

## 2022-03-19 DIAGNOSIS — I2510 Atherosclerotic heart disease of native coronary artery without angina pectoris: Secondary | ICD-10-CM | POA: Diagnosis not present

## 2022-03-19 DIAGNOSIS — F01B2 Vascular dementia, moderate, with psychotic disturbance: Secondary | ICD-10-CM | POA: Diagnosis not present

## 2022-03-19 DIAGNOSIS — I10 Essential (primary) hypertension: Secondary | ICD-10-CM

## 2022-03-19 MED ORDER — GABAPENTIN 300 MG PO CAPS: 300.0000 mg | ORAL_CAPSULE | Freq: Every day | ORAL | 3 refills | Status: DC

## 2022-03-19 MED ORDER — MELOXICAM 7.5 MG PO TABS: 7.5000 mg | ORAL_TABLET | Freq: Every day | ORAL | 3 refills | Status: DC | PRN

## 2022-03-19 MED ORDER — AMLODIPINE BESYLATE 5 MG PO TABS: 5.0000 mg | ORAL_TABLET | Freq: Two times a day (BID) | ORAL | 3 refills | Status: DC

## 2022-03-19 MED ORDER — DIVALPROEX SODIUM 250 MG PO DR TAB: 250.0000 mg | DELAYED_RELEASE_TABLET | Freq: Two times a day (BID) | ORAL | 0 refills | Status: DC

## 2022-03-19 MED ORDER — OMEPRAZOLE 40 MG PO CPDR: 40.0000 mg | DELAYED_RELEASE_CAPSULE | Freq: Every day | ORAL | 3 refills | Status: DC

## 2022-03-19 MED ORDER — DIVALPROEX SODIUM 125 MG PO DR TAB: 125.0000 mg | DELAYED_RELEASE_TABLET | Freq: Every day | ORAL | 0 refills | Status: DC

## 2022-03-19 NOTE — Assessment & Plan Note (Signed)
Chronic Controlled, Stable Continue Seroquel 50 mg at bedtime No longer on Cymbalta

## 2022-03-19 NOTE — Assessment & Plan Note (Addendum)
Chronic Following with Dr. Delice Lesch And Seroquel 50 mg at bedtime, Aricept 10 mg nightly, Namenda 10 mg twice daily, buspirone 15 mg twice daily, just started on risperadone Overall stable, but progressive

## 2022-03-19 NOTE — Assessment & Plan Note (Addendum)
Chronic Blood pressure well controlled Continue amlodipine 5 mg twice daily, carvedilol 6.25 mg twice daily

## 2022-03-19 NOTE — Assessment & Plan Note (Signed)
Chronic Diet controlled-sugars hers have been well-controlled Lab Results  Component Value Date   HGBA1C 5.8 09/05/2021    No need for medication

## 2022-03-19 NOTE — Assessment & Plan Note (Signed)
Chronic Continue gabapentin 300 mg at night, meloxicam 7.5 mg daily with food

## 2022-03-19 NOTE — Assessment & Plan Note (Signed)
Chronic GERD controlled Continue omeprazole 40 mg daily 

## 2022-03-19 NOTE — Assessment & Plan Note (Signed)
Chronic Lifestyle controlled

## 2022-03-19 NOTE — Assessment & Plan Note (Signed)
Saw by palliative nurse 2 days ago Flare of pseudogout Started on prednisone Symptoms improved today

## 2022-03-20 ENCOUNTER — Encounter: Payer: Medicare Other | Admitting: Family Medicine

## 2022-03-20 DIAGNOSIS — E1151 Type 2 diabetes mellitus with diabetic peripheral angiopathy without gangrene: Secondary | ICD-10-CM | POA: Diagnosis not present

## 2022-03-20 DIAGNOSIS — I10 Essential (primary) hypertension: Secondary | ICD-10-CM | POA: Diagnosis not present

## 2022-03-20 DIAGNOSIS — F01B2 Vascular dementia, moderate, with psychotic disturbance: Secondary | ICD-10-CM | POA: Diagnosis not present

## 2022-03-20 DIAGNOSIS — E1140 Type 2 diabetes mellitus with diabetic neuropathy, unspecified: Secondary | ICD-10-CM | POA: Diagnosis not present

## 2022-03-20 DIAGNOSIS — I2510 Atherosclerotic heart disease of native coronary artery without angina pectoris: Secondary | ICD-10-CM | POA: Diagnosis not present

## 2022-03-20 DIAGNOSIS — F01B3 Vascular dementia, moderate, with mood disturbance: Secondary | ICD-10-CM | POA: Diagnosis not present

## 2022-03-24 ENCOUNTER — Telehealth: Payer: Self-pay | Admitting: Family Medicine

## 2022-03-24 DIAGNOSIS — F01B2 Vascular dementia, moderate, with psychotic disturbance: Secondary | ICD-10-CM | POA: Diagnosis not present

## 2022-03-24 DIAGNOSIS — I2510 Atherosclerotic heart disease of native coronary artery without angina pectoris: Secondary | ICD-10-CM | POA: Diagnosis not present

## 2022-03-24 DIAGNOSIS — E1151 Type 2 diabetes mellitus with diabetic peripheral angiopathy without gangrene: Secondary | ICD-10-CM | POA: Diagnosis not present

## 2022-03-24 DIAGNOSIS — E1140 Type 2 diabetes mellitus with diabetic neuropathy, unspecified: Secondary | ICD-10-CM | POA: Diagnosis not present

## 2022-03-24 DIAGNOSIS — F01B3 Vascular dementia, moderate, with mood disturbance: Secondary | ICD-10-CM | POA: Diagnosis not present

## 2022-03-24 DIAGNOSIS — I10 Essential (primary) hypertension: Secondary | ICD-10-CM | POA: Diagnosis not present

## 2022-03-25 NOTE — Telephone Encounter (Signed)
Late entry for 03/24/22 at 10:03 am Returning vm left by pt's daughter Guadlupe Spanish regarding pain management. She states that her mother remains in the bed, doesn't want to get up and is not eating well currently.  She is drinking fluids.  [name] states "This is how she gets when her back is hurting.  She doesn't get up, eat or want anyone touching her."  She states pain overall from pseudogout flare with Prednisone is slowly improving but wants to know if she can have something for pain.  Pt is getting Tylenol 1000 mg BID scheduled.  She states that previously pt had Tramadol which has worked well for her.  She states since initiation of Risperidone, pt has been more cooperative with staff changing her.  She plans to get some Boost or Ensure while she is currently in a flare to see if she can get in nutrition.  She states swelling has decreased in right hand but she keeps it bent.  Agree with plan to start some Tramadol for a few days to see if this will improve, will give scheduled then decrease to prn as pt is completing steroid taper to see if this will help manage pain and get patient moving.  Spoke with Phineas Real, nurse at [facility] and she seconds [name]'s comments that pt is more cooperative with changing but is staying in the bed and not eating.  She states that the facility uses [facility] out of [place].  Advised that I would send in a prescription to the pharmacy for Tramadol 50 mg TID x 3 days then change to TID prn.  She took verbal order but still needs signed copy.  Will get to facility to sign the orders.  Damaris Hippo FNP-C

## 2022-03-31 DIAGNOSIS — F01B3 Vascular dementia, moderate, with mood disturbance: Secondary | ICD-10-CM | POA: Diagnosis not present

## 2022-03-31 DIAGNOSIS — E1151 Type 2 diabetes mellitus with diabetic peripheral angiopathy without gangrene: Secondary | ICD-10-CM | POA: Diagnosis not present

## 2022-03-31 DIAGNOSIS — I2510 Atherosclerotic heart disease of native coronary artery without angina pectoris: Secondary | ICD-10-CM | POA: Diagnosis not present

## 2022-03-31 DIAGNOSIS — I10 Essential (primary) hypertension: Secondary | ICD-10-CM | POA: Diagnosis not present

## 2022-03-31 DIAGNOSIS — F01B2 Vascular dementia, moderate, with psychotic disturbance: Secondary | ICD-10-CM | POA: Diagnosis not present

## 2022-03-31 DIAGNOSIS — E1140 Type 2 diabetes mellitus with diabetic neuropathy, unspecified: Secondary | ICD-10-CM | POA: Diagnosis not present

## 2022-04-01 ENCOUNTER — Telehealth: Payer: Self-pay | Admitting: Internal Medicine

## 2022-04-01 DIAGNOSIS — E876 Hypokalemia: Secondary | ICD-10-CM | POA: Diagnosis not present

## 2022-04-01 DIAGNOSIS — G4700 Insomnia, unspecified: Secondary | ICD-10-CM | POA: Diagnosis not present

## 2022-04-01 DIAGNOSIS — M5430 Sciatica, unspecified side: Secondary | ICD-10-CM | POA: Diagnosis not present

## 2022-04-01 DIAGNOSIS — M109 Gout, unspecified: Secondary | ICD-10-CM | POA: Diagnosis not present

## 2022-04-01 DIAGNOSIS — R410 Disorientation, unspecified: Secondary | ICD-10-CM | POA: Diagnosis not present

## 2022-04-01 DIAGNOSIS — D649 Anemia, unspecified: Secondary | ICD-10-CM | POA: Diagnosis not present

## 2022-04-01 DIAGNOSIS — N179 Acute kidney failure, unspecified: Secondary | ICD-10-CM | POA: Diagnosis not present

## 2022-04-01 DIAGNOSIS — F01B3 Vascular dementia, moderate, with mood disturbance: Secondary | ICD-10-CM | POA: Diagnosis not present

## 2022-04-01 DIAGNOSIS — I2510 Atherosclerotic heart disease of native coronary artery without angina pectoris: Secondary | ICD-10-CM | POA: Diagnosis not present

## 2022-04-01 DIAGNOSIS — M48061 Spinal stenosis, lumbar region without neurogenic claudication: Secondary | ICD-10-CM | POA: Diagnosis not present

## 2022-04-01 DIAGNOSIS — K219 Gastro-esophageal reflux disease without esophagitis: Secondary | ICD-10-CM | POA: Diagnosis not present

## 2022-04-01 DIAGNOSIS — M21619 Bunion of unspecified foot: Secondary | ICD-10-CM | POA: Diagnosis not present

## 2022-04-01 DIAGNOSIS — M25531 Pain in right wrist: Secondary | ICD-10-CM | POA: Diagnosis not present

## 2022-04-01 DIAGNOSIS — E1151 Type 2 diabetes mellitus with diabetic peripheral angiopathy without gangrene: Secondary | ICD-10-CM | POA: Diagnosis not present

## 2022-04-01 DIAGNOSIS — M19012 Primary osteoarthritis, left shoulder: Secondary | ICD-10-CM | POA: Diagnosis not present

## 2022-04-01 DIAGNOSIS — D126 Benign neoplasm of colon, unspecified: Secondary | ICD-10-CM | POA: Diagnosis not present

## 2022-04-01 DIAGNOSIS — Z87440 Personal history of urinary (tract) infections: Secondary | ICD-10-CM | POA: Diagnosis not present

## 2022-04-01 DIAGNOSIS — E1140 Type 2 diabetes mellitus with diabetic neuropathy, unspecified: Secondary | ICD-10-CM | POA: Diagnosis not present

## 2022-04-01 DIAGNOSIS — M797 Fibromyalgia: Secondary | ICD-10-CM | POA: Diagnosis not present

## 2022-04-01 DIAGNOSIS — I10 Essential (primary) hypertension: Secondary | ICD-10-CM | POA: Diagnosis not present

## 2022-04-01 DIAGNOSIS — M4726 Other spondylosis with radiculopathy, lumbar region: Secondary | ICD-10-CM | POA: Diagnosis not present

## 2022-04-01 DIAGNOSIS — E785 Hyperlipidemia, unspecified: Secondary | ICD-10-CM | POA: Diagnosis not present

## 2022-04-01 DIAGNOSIS — M769 Unspecified enthesopathy, lower limb, excluding foot: Secondary | ICD-10-CM | POA: Diagnosis not present

## 2022-04-01 DIAGNOSIS — F01B2 Vascular dementia, moderate, with psychotic disturbance: Secondary | ICD-10-CM | POA: Diagnosis not present

## 2022-04-01 DIAGNOSIS — F32A Depression, unspecified: Secondary | ICD-10-CM | POA: Diagnosis not present

## 2022-04-01 NOTE — Telephone Encounter (Signed)
Melissa from [facility] called for verbals for home visits: 2X for 1 week & 1X for 3 weeks   Please call Melissa to confirm:   [phone]

## 2022-04-01 NOTE — Telephone Encounter (Signed)
Okay for orders? 

## 2022-04-01 NOTE — Telephone Encounter (Signed)
Called and left verbals today for Melissa on her voicemail.

## 2022-04-02 DIAGNOSIS — E1140 Type 2 diabetes mellitus with diabetic neuropathy, unspecified: Secondary | ICD-10-CM | POA: Diagnosis not present

## 2022-04-02 DIAGNOSIS — F01B3 Vascular dementia, moderate, with mood disturbance: Secondary | ICD-10-CM | POA: Diagnosis not present

## 2022-04-02 DIAGNOSIS — I10 Essential (primary) hypertension: Secondary | ICD-10-CM | POA: Diagnosis not present

## 2022-04-02 DIAGNOSIS — E1151 Type 2 diabetes mellitus with diabetic peripheral angiopathy without gangrene: Secondary | ICD-10-CM | POA: Diagnosis not present

## 2022-04-02 DIAGNOSIS — F01B2 Vascular dementia, moderate, with psychotic disturbance: Secondary | ICD-10-CM | POA: Diagnosis not present

## 2022-04-02 DIAGNOSIS — I2510 Atherosclerotic heart disease of native coronary artery without angina pectoris: Secondary | ICD-10-CM | POA: Diagnosis not present

## 2022-04-04 DIAGNOSIS — B351 Tinea unguium: Secondary | ICD-10-CM | POA: Diagnosis not present

## 2022-04-04 DIAGNOSIS — I7091 Generalized atherosclerosis: Secondary | ICD-10-CM | POA: Diagnosis not present

## 2022-04-07 DIAGNOSIS — I10 Essential (primary) hypertension: Secondary | ICD-10-CM | POA: Diagnosis not present

## 2022-04-07 DIAGNOSIS — E1140 Type 2 diabetes mellitus with diabetic neuropathy, unspecified: Secondary | ICD-10-CM | POA: Diagnosis not present

## 2022-04-07 DIAGNOSIS — F01B3 Vascular dementia, moderate, with mood disturbance: Secondary | ICD-10-CM | POA: Diagnosis not present

## 2022-04-07 DIAGNOSIS — F01B2 Vascular dementia, moderate, with psychotic disturbance: Secondary | ICD-10-CM | POA: Diagnosis not present

## 2022-04-07 DIAGNOSIS — I2510 Atherosclerotic heart disease of native coronary artery without angina pectoris: Secondary | ICD-10-CM | POA: Diagnosis not present

## 2022-04-07 DIAGNOSIS — E1151 Type 2 diabetes mellitus with diabetic peripheral angiopathy without gangrene: Secondary | ICD-10-CM | POA: Diagnosis not present

## 2022-04-08 DIAGNOSIS — F01B2 Vascular dementia, moderate, with psychotic disturbance: Secondary | ICD-10-CM | POA: Diagnosis not present

## 2022-04-08 DIAGNOSIS — E1151 Type 2 diabetes mellitus with diabetic peripheral angiopathy without gangrene: Secondary | ICD-10-CM | POA: Diagnosis not present

## 2022-04-08 DIAGNOSIS — F01B3 Vascular dementia, moderate, with mood disturbance: Secondary | ICD-10-CM | POA: Diagnosis not present

## 2022-04-08 DIAGNOSIS — I10 Essential (primary) hypertension: Secondary | ICD-10-CM | POA: Diagnosis not present

## 2022-04-08 DIAGNOSIS — I2510 Atherosclerotic heart disease of native coronary artery without angina pectoris: Secondary | ICD-10-CM | POA: Diagnosis not present

## 2022-04-08 DIAGNOSIS — E1140 Type 2 diabetes mellitus with diabetic neuropathy, unspecified: Secondary | ICD-10-CM | POA: Diagnosis not present

## 2022-04-11 DIAGNOSIS — E1151 Type 2 diabetes mellitus with diabetic peripheral angiopathy without gangrene: Secondary | ICD-10-CM | POA: Diagnosis not present

## 2022-04-11 DIAGNOSIS — I10 Essential (primary) hypertension: Secondary | ICD-10-CM | POA: Diagnosis not present

## 2022-04-11 DIAGNOSIS — E1140 Type 2 diabetes mellitus with diabetic neuropathy, unspecified: Secondary | ICD-10-CM | POA: Diagnosis not present

## 2022-04-11 DIAGNOSIS — F01B2 Vascular dementia, moderate, with psychotic disturbance: Secondary | ICD-10-CM | POA: Diagnosis not present

## 2022-04-11 DIAGNOSIS — F01B3 Vascular dementia, moderate, with mood disturbance: Secondary | ICD-10-CM | POA: Diagnosis not present

## 2022-04-11 DIAGNOSIS — I2510 Atherosclerotic heart disease of native coronary artery without angina pectoris: Secondary | ICD-10-CM | POA: Diagnosis not present

## 2022-04-15 DIAGNOSIS — I2510 Atherosclerotic heart disease of native coronary artery without angina pectoris: Secondary | ICD-10-CM | POA: Diagnosis not present

## 2022-04-15 DIAGNOSIS — E1140 Type 2 diabetes mellitus with diabetic neuropathy, unspecified: Secondary | ICD-10-CM | POA: Diagnosis not present

## 2022-04-15 DIAGNOSIS — I10 Essential (primary) hypertension: Secondary | ICD-10-CM | POA: Diagnosis not present

## 2022-04-15 DIAGNOSIS — F01B2 Vascular dementia, moderate, with psychotic disturbance: Secondary | ICD-10-CM | POA: Diagnosis not present

## 2022-04-15 DIAGNOSIS — E1151 Type 2 diabetes mellitus with diabetic peripheral angiopathy without gangrene: Secondary | ICD-10-CM | POA: Diagnosis not present

## 2022-04-15 DIAGNOSIS — F01B3 Vascular dementia, moderate, with mood disturbance: Secondary | ICD-10-CM | POA: Diagnosis not present

## 2022-04-17 ENCOUNTER — Encounter: Payer: Medicare Other | Admitting: Family Medicine

## 2022-04-17 DIAGNOSIS — E1140 Type 2 diabetes mellitus with diabetic neuropathy, unspecified: Secondary | ICD-10-CM | POA: Diagnosis not present

## 2022-04-17 DIAGNOSIS — I10 Essential (primary) hypertension: Secondary | ICD-10-CM | POA: Diagnosis not present

## 2022-04-17 DIAGNOSIS — F01B2 Vascular dementia, moderate, with psychotic disturbance: Secondary | ICD-10-CM | POA: Diagnosis not present

## 2022-04-17 DIAGNOSIS — F01B3 Vascular dementia, moderate, with mood disturbance: Secondary | ICD-10-CM | POA: Diagnosis not present

## 2022-04-17 DIAGNOSIS — I2510 Atherosclerotic heart disease of native coronary artery without angina pectoris: Secondary | ICD-10-CM | POA: Diagnosis not present

## 2022-04-17 DIAGNOSIS — E1151 Type 2 diabetes mellitus with diabetic peripheral angiopathy without gangrene: Secondary | ICD-10-CM | POA: Diagnosis not present

## 2022-04-21 DIAGNOSIS — E1140 Type 2 diabetes mellitus with diabetic neuropathy, unspecified: Secondary | ICD-10-CM | POA: Diagnosis not present

## 2022-04-21 DIAGNOSIS — F01B3 Vascular dementia, moderate, with mood disturbance: Secondary | ICD-10-CM | POA: Diagnosis not present

## 2022-04-21 DIAGNOSIS — E1151 Type 2 diabetes mellitus with diabetic peripheral angiopathy without gangrene: Secondary | ICD-10-CM | POA: Diagnosis not present

## 2022-04-21 DIAGNOSIS — I10 Essential (primary) hypertension: Secondary | ICD-10-CM | POA: Diagnosis not present

## 2022-04-21 DIAGNOSIS — F01B2 Vascular dementia, moderate, with psychotic disturbance: Secondary | ICD-10-CM | POA: Diagnosis not present

## 2022-04-21 DIAGNOSIS — I2510 Atherosclerotic heart disease of native coronary artery without angina pectoris: Secondary | ICD-10-CM | POA: Diagnosis not present

## 2022-04-22 DIAGNOSIS — I2510 Atherosclerotic heart disease of native coronary artery without angina pectoris: Secondary | ICD-10-CM | POA: Diagnosis not present

## 2022-04-22 DIAGNOSIS — I10 Essential (primary) hypertension: Secondary | ICD-10-CM | POA: Diagnosis not present

## 2022-04-22 DIAGNOSIS — E1140 Type 2 diabetes mellitus with diabetic neuropathy, unspecified: Secondary | ICD-10-CM | POA: Diagnosis not present

## 2022-04-22 DIAGNOSIS — F01B2 Vascular dementia, moderate, with psychotic disturbance: Secondary | ICD-10-CM | POA: Diagnosis not present

## 2022-04-22 DIAGNOSIS — E1151 Type 2 diabetes mellitus with diabetic peripheral angiopathy without gangrene: Secondary | ICD-10-CM | POA: Diagnosis not present

## 2022-04-22 DIAGNOSIS — F01B3 Vascular dementia, moderate, with mood disturbance: Secondary | ICD-10-CM | POA: Diagnosis not present

## 2022-04-29 DIAGNOSIS — F01B3 Vascular dementia, moderate, with mood disturbance: Secondary | ICD-10-CM | POA: Diagnosis not present

## 2022-04-29 DIAGNOSIS — E1151 Type 2 diabetes mellitus with diabetic peripheral angiopathy without gangrene: Secondary | ICD-10-CM | POA: Diagnosis not present

## 2022-04-29 DIAGNOSIS — I10 Essential (primary) hypertension: Secondary | ICD-10-CM | POA: Diagnosis not present

## 2022-04-29 DIAGNOSIS — I2510 Atherosclerotic heart disease of native coronary artery without angina pectoris: Secondary | ICD-10-CM | POA: Diagnosis not present

## 2022-04-29 DIAGNOSIS — E1140 Type 2 diabetes mellitus with diabetic neuropathy, unspecified: Secondary | ICD-10-CM | POA: Diagnosis not present

## 2022-04-29 DIAGNOSIS — F01B2 Vascular dementia, moderate, with psychotic disturbance: Secondary | ICD-10-CM | POA: Diagnosis not present

## 2022-04-30 ENCOUNTER — Telehealth: Payer: Self-pay | Admitting: Internal Medicine

## 2022-04-30 NOTE — Telephone Encounter (Signed)
Okay for orders? 

## 2022-04-30 NOTE — Telephone Encounter (Signed)
[place] Name: [facility] Agency Name: [facility] Phone #: [phone] Secure  Service Requested: OT (examples: OT/PT/Skilled Nursing/Social Work/Speech Therapy/Wound Care)  Frequency of Visits: 1 week 8

## 2022-05-01 DIAGNOSIS — Z87440 Personal history of urinary (tract) infections: Secondary | ICD-10-CM | POA: Diagnosis not present

## 2022-05-01 DIAGNOSIS — M4726 Other spondylosis with radiculopathy, lumbar region: Secondary | ICD-10-CM | POA: Diagnosis not present

## 2022-05-01 DIAGNOSIS — F01B2 Vascular dementia, moderate, with psychotic disturbance: Secondary | ICD-10-CM | POA: Diagnosis not present

## 2022-05-01 DIAGNOSIS — G4700 Insomnia, unspecified: Secondary | ICD-10-CM | POA: Diagnosis not present

## 2022-05-01 DIAGNOSIS — I10 Essential (primary) hypertension: Secondary | ICD-10-CM | POA: Diagnosis not present

## 2022-05-01 DIAGNOSIS — F32A Depression, unspecified: Secondary | ICD-10-CM | POA: Diagnosis not present

## 2022-05-01 DIAGNOSIS — M5430 Sciatica, unspecified side: Secondary | ICD-10-CM | POA: Diagnosis not present

## 2022-05-01 DIAGNOSIS — M25531 Pain in right wrist: Secondary | ICD-10-CM | POA: Diagnosis not present

## 2022-05-01 DIAGNOSIS — F01B3 Vascular dementia, moderate, with mood disturbance: Secondary | ICD-10-CM | POA: Diagnosis not present

## 2022-05-01 DIAGNOSIS — M797 Fibromyalgia: Secondary | ICD-10-CM | POA: Diagnosis not present

## 2022-05-01 DIAGNOSIS — D126 Benign neoplasm of colon, unspecified: Secondary | ICD-10-CM | POA: Diagnosis not present

## 2022-05-01 DIAGNOSIS — E785 Hyperlipidemia, unspecified: Secondary | ICD-10-CM | POA: Diagnosis not present

## 2022-05-01 DIAGNOSIS — E1140 Type 2 diabetes mellitus with diabetic neuropathy, unspecified: Secondary | ICD-10-CM | POA: Diagnosis not present

## 2022-05-01 DIAGNOSIS — E876 Hypokalemia: Secondary | ICD-10-CM | POA: Diagnosis not present

## 2022-05-01 DIAGNOSIS — M48061 Spinal stenosis, lumbar region without neurogenic claudication: Secondary | ICD-10-CM | POA: Diagnosis not present

## 2022-05-01 DIAGNOSIS — M109 Gout, unspecified: Secondary | ICD-10-CM | POA: Diagnosis not present

## 2022-05-01 DIAGNOSIS — N179 Acute kidney failure, unspecified: Secondary | ICD-10-CM | POA: Diagnosis not present

## 2022-05-01 DIAGNOSIS — I2510 Atherosclerotic heart disease of native coronary artery without angina pectoris: Secondary | ICD-10-CM | POA: Diagnosis not present

## 2022-05-01 DIAGNOSIS — E1151 Type 2 diabetes mellitus with diabetic peripheral angiopathy without gangrene: Secondary | ICD-10-CM | POA: Diagnosis not present

## 2022-05-01 DIAGNOSIS — M21619 Bunion of unspecified foot: Secondary | ICD-10-CM | POA: Diagnosis not present

## 2022-05-01 DIAGNOSIS — R410 Disorientation, unspecified: Secondary | ICD-10-CM | POA: Diagnosis not present

## 2022-05-01 DIAGNOSIS — D649 Anemia, unspecified: Secondary | ICD-10-CM | POA: Diagnosis not present

## 2022-05-01 DIAGNOSIS — M769 Unspecified enthesopathy, lower limb, excluding foot: Secondary | ICD-10-CM | POA: Diagnosis not present

## 2022-05-01 DIAGNOSIS — K219 Gastro-esophageal reflux disease without esophagitis: Secondary | ICD-10-CM | POA: Diagnosis not present

## 2022-05-01 DIAGNOSIS — M19012 Primary osteoarthritis, left shoulder: Secondary | ICD-10-CM | POA: Diagnosis not present

## 2022-05-01 NOTE — Telephone Encounter (Signed)
Verbals given  

## 2022-05-05 DIAGNOSIS — Z87891 Personal history of nicotine dependence: Secondary | ICD-10-CM

## 2022-05-05 DIAGNOSIS — M4726 Other spondylosis with radiculopathy, lumbar region: Secondary | ICD-10-CM | POA: Diagnosis not present

## 2022-05-05 DIAGNOSIS — M109 Gout, unspecified: Secondary | ICD-10-CM

## 2022-05-05 DIAGNOSIS — R609 Edema, unspecified: Secondary | ICD-10-CM

## 2022-05-05 DIAGNOSIS — M21619 Bunion of unspecified foot: Secondary | ICD-10-CM

## 2022-05-05 DIAGNOSIS — M769 Unspecified enthesopathy, lower limb, excluding foot: Secondary | ICD-10-CM

## 2022-05-05 DIAGNOSIS — N179 Acute kidney failure, unspecified: Secondary | ICD-10-CM

## 2022-05-05 DIAGNOSIS — D126 Benign neoplasm of colon, unspecified: Secondary | ICD-10-CM

## 2022-05-05 DIAGNOSIS — I10 Essential (primary) hypertension: Secondary | ICD-10-CM | POA: Diagnosis not present

## 2022-05-05 DIAGNOSIS — E1151 Type 2 diabetes mellitus with diabetic peripheral angiopathy without gangrene: Secondary | ICD-10-CM | POA: Diagnosis not present

## 2022-05-05 DIAGNOSIS — D649 Anemia, unspecified: Secondary | ICD-10-CM

## 2022-05-05 DIAGNOSIS — E785 Hyperlipidemia, unspecified: Secondary | ICD-10-CM | POA: Diagnosis not present

## 2022-05-05 DIAGNOSIS — Z951 Presence of aortocoronary bypass graft: Secondary | ICD-10-CM

## 2022-05-05 DIAGNOSIS — I2510 Atherosclerotic heart disease of native coronary artery without angina pectoris: Secondary | ICD-10-CM | POA: Diagnosis not present

## 2022-05-05 DIAGNOSIS — M19012 Primary osteoarthritis, left shoulder: Secondary | ICD-10-CM

## 2022-05-05 DIAGNOSIS — M25531 Pain in right wrist: Secondary | ICD-10-CM

## 2022-05-05 DIAGNOSIS — Z791 Long term (current) use of non-steroidal anti-inflammatories (NSAID): Secondary | ICD-10-CM

## 2022-05-05 DIAGNOSIS — G4700 Insomnia, unspecified: Secondary | ICD-10-CM

## 2022-05-05 DIAGNOSIS — Z7982 Long term (current) use of aspirin: Secondary | ICD-10-CM

## 2022-05-05 DIAGNOSIS — M48061 Spinal stenosis, lumbar region without neurogenic claudication: Secondary | ICD-10-CM

## 2022-05-05 DIAGNOSIS — M797 Fibromyalgia: Secondary | ICD-10-CM | POA: Diagnosis not present

## 2022-05-05 DIAGNOSIS — R410 Disorientation, unspecified: Secondary | ICD-10-CM | POA: Diagnosis not present

## 2022-05-05 DIAGNOSIS — F32A Depression, unspecified: Secondary | ICD-10-CM | POA: Diagnosis not present

## 2022-05-05 DIAGNOSIS — E1140 Type 2 diabetes mellitus with diabetic neuropathy, unspecified: Secondary | ICD-10-CM | POA: Diagnosis not present

## 2022-05-05 DIAGNOSIS — M5430 Sciatica, unspecified side: Secondary | ICD-10-CM | POA: Diagnosis not present

## 2022-05-05 DIAGNOSIS — Z96653 Presence of artificial knee joint, bilateral: Secondary | ICD-10-CM

## 2022-05-05 DIAGNOSIS — F01B2 Vascular dementia, moderate, with psychotic disturbance: Secondary | ICD-10-CM | POA: Diagnosis not present

## 2022-05-05 DIAGNOSIS — K219 Gastro-esophageal reflux disease without esophagitis: Secondary | ICD-10-CM

## 2022-05-05 DIAGNOSIS — E876 Hypokalemia: Secondary | ICD-10-CM

## 2022-05-05 DIAGNOSIS — F01B3 Vascular dementia, moderate, with mood disturbance: Secondary | ICD-10-CM | POA: Diagnosis not present

## 2022-05-05 DIAGNOSIS — Z87440 Personal history of urinary (tract) infections: Secondary | ICD-10-CM

## 2022-05-08 DIAGNOSIS — I10 Essential (primary) hypertension: Secondary | ICD-10-CM | POA: Diagnosis not present

## 2022-05-08 DIAGNOSIS — I2510 Atherosclerotic heart disease of native coronary artery without angina pectoris: Secondary | ICD-10-CM | POA: Diagnosis not present

## 2022-05-08 DIAGNOSIS — F01B3 Vascular dementia, moderate, with mood disturbance: Secondary | ICD-10-CM | POA: Diagnosis not present

## 2022-05-08 DIAGNOSIS — E1140 Type 2 diabetes mellitus with diabetic neuropathy, unspecified: Secondary | ICD-10-CM | POA: Diagnosis not present

## 2022-05-08 DIAGNOSIS — F01B2 Vascular dementia, moderate, with psychotic disturbance: Secondary | ICD-10-CM | POA: Diagnosis not present

## 2022-05-08 DIAGNOSIS — E1151 Type 2 diabetes mellitus with diabetic peripheral angiopathy without gangrene: Secondary | ICD-10-CM | POA: Diagnosis not present

## 2022-05-16 DIAGNOSIS — I10 Essential (primary) hypertension: Secondary | ICD-10-CM | POA: Diagnosis not present

## 2022-05-16 DIAGNOSIS — E1151 Type 2 diabetes mellitus with diabetic peripheral angiopathy without gangrene: Secondary | ICD-10-CM | POA: Diagnosis not present

## 2022-05-16 DIAGNOSIS — I2510 Atherosclerotic heart disease of native coronary artery without angina pectoris: Secondary | ICD-10-CM | POA: Diagnosis not present

## 2022-05-16 DIAGNOSIS — F01B3 Vascular dementia, moderate, with mood disturbance: Secondary | ICD-10-CM | POA: Diagnosis not present

## 2022-05-16 DIAGNOSIS — E1140 Type 2 diabetes mellitus with diabetic neuropathy, unspecified: Secondary | ICD-10-CM | POA: Diagnosis not present

## 2022-05-16 DIAGNOSIS — F01B2 Vascular dementia, moderate, with psychotic disturbance: Secondary | ICD-10-CM | POA: Diagnosis not present

## 2022-05-21 DIAGNOSIS — I2510 Atherosclerotic heart disease of native coronary artery without angina pectoris: Secondary | ICD-10-CM | POA: Diagnosis not present

## 2022-05-21 DIAGNOSIS — F01B3 Vascular dementia, moderate, with mood disturbance: Secondary | ICD-10-CM | POA: Diagnosis not present

## 2022-05-21 DIAGNOSIS — E1140 Type 2 diabetes mellitus with diabetic neuropathy, unspecified: Secondary | ICD-10-CM | POA: Diagnosis not present

## 2022-05-21 DIAGNOSIS — I10 Essential (primary) hypertension: Secondary | ICD-10-CM | POA: Diagnosis not present

## 2022-05-21 DIAGNOSIS — F01B2 Vascular dementia, moderate, with psychotic disturbance: Secondary | ICD-10-CM | POA: Diagnosis not present

## 2022-05-21 DIAGNOSIS — E1151 Type 2 diabetes mellitus with diabetic peripheral angiopathy without gangrene: Secondary | ICD-10-CM | POA: Diagnosis not present

## 2022-05-22 ENCOUNTER — Telehealth: Payer: Self-pay | Admitting: Internal Medicine

## 2022-05-22 NOTE — Telephone Encounter (Signed)
Daughter is asking that the patient have a mychart visit for an appt f/u. Please advise

## 2022-05-25 DIAGNOSIS — I10 Essential (primary) hypertension: Secondary | ICD-10-CM | POA: Diagnosis not present

## 2022-05-25 DIAGNOSIS — E1140 Type 2 diabetes mellitus with diabetic neuropathy, unspecified: Secondary | ICD-10-CM | POA: Diagnosis not present

## 2022-05-25 DIAGNOSIS — E1151 Type 2 diabetes mellitus with diabetic peripheral angiopathy without gangrene: Secondary | ICD-10-CM | POA: Diagnosis not present

## 2022-05-25 DIAGNOSIS — F01B3 Vascular dementia, moderate, with mood disturbance: Secondary | ICD-10-CM | POA: Diagnosis not present

## 2022-05-25 DIAGNOSIS — I2510 Atherosclerotic heart disease of native coronary artery without angina pectoris: Secondary | ICD-10-CM | POA: Diagnosis not present

## 2022-05-25 DIAGNOSIS — F01B2 Vascular dementia, moderate, with psychotic disturbance: Secondary | ICD-10-CM | POA: Diagnosis not present

## 2022-05-26 NOTE — Telephone Encounter (Signed)
Also sent a message via My Chart.

## 2022-05-26 NOTE — Telephone Encounter (Signed)
Left a message for the pts daughter that we can do a virtual visit for the pt... will also send her a My Char message.

## 2022-05-28 NOTE — Telephone Encounter (Signed)
[name], hooper are scheduled for a virtual visit with your provider on July 16, 2022.  Just as we do with appointments in the office, we must obtain your consent to participate.  Your consent will be active for this visit and any virtual visit you may have with one of our providers in the next 365 days.    If you have a MyChart account, I can also send a copy of this consent to you electronically.  All virtual visits are billed to your insurance company just like a traditional visit in the office.  As this is a virtual visit, video technology does not allow for your provider to perform a traditional examination.  This may limit your provider's ability to fully assess your condition.  If your provider identifies any concerns that need to be evaluated in person or the need to arrange testing such as labs, EKG, etc, we will make arrangements to do so.    Although advances in technology are sophisticated, we cannot ensure that it will always work on either your end or our end.  If the connection with a video visit is poor, we may have to switch to a telephone visit.  With either a video or telephone visit, we are not always able to ensure that we have a secure connection.   I need to obtain your verbal consent now.   Are you willing to proceed with your visit today?   [name] has provided verbal consent on 05/28/2022 for a virtual visit (video or telephone).

## 2022-05-31 DIAGNOSIS — M4726 Other spondylosis with radiculopathy, lumbar region: Secondary | ICD-10-CM | POA: Diagnosis not present

## 2022-05-31 DIAGNOSIS — I10 Essential (primary) hypertension: Secondary | ICD-10-CM | POA: Diagnosis not present

## 2022-05-31 DIAGNOSIS — M797 Fibromyalgia: Secondary | ICD-10-CM | POA: Diagnosis not present

## 2022-05-31 DIAGNOSIS — I2510 Atherosclerotic heart disease of native coronary artery without angina pectoris: Secondary | ICD-10-CM | POA: Diagnosis not present

## 2022-05-31 DIAGNOSIS — M19012 Primary osteoarthritis, left shoulder: Secondary | ICD-10-CM | POA: Diagnosis not present

## 2022-05-31 DIAGNOSIS — F01B2 Vascular dementia, moderate, with psychotic disturbance: Secondary | ICD-10-CM | POA: Diagnosis not present

## 2022-05-31 DIAGNOSIS — D649 Anemia, unspecified: Secondary | ICD-10-CM | POA: Diagnosis not present

## 2022-05-31 DIAGNOSIS — F01B3 Vascular dementia, moderate, with mood disturbance: Secondary | ICD-10-CM | POA: Diagnosis not present

## 2022-05-31 DIAGNOSIS — M769 Unspecified enthesopathy, lower limb, excluding foot: Secondary | ICD-10-CM | POA: Diagnosis not present

## 2022-05-31 DIAGNOSIS — F32A Depression, unspecified: Secondary | ICD-10-CM | POA: Diagnosis not present

## 2022-05-31 DIAGNOSIS — M109 Gout, unspecified: Secondary | ICD-10-CM | POA: Diagnosis not present

## 2022-05-31 DIAGNOSIS — D126 Benign neoplasm of colon, unspecified: Secondary | ICD-10-CM | POA: Diagnosis not present

## 2022-05-31 DIAGNOSIS — N179 Acute kidney failure, unspecified: Secondary | ICD-10-CM | POA: Diagnosis not present

## 2022-05-31 DIAGNOSIS — E785 Hyperlipidemia, unspecified: Secondary | ICD-10-CM | POA: Diagnosis not present

## 2022-05-31 DIAGNOSIS — M25531 Pain in right wrist: Secondary | ICD-10-CM | POA: Diagnosis not present

## 2022-05-31 DIAGNOSIS — K219 Gastro-esophageal reflux disease without esophagitis: Secondary | ICD-10-CM | POA: Diagnosis not present

## 2022-05-31 DIAGNOSIS — M48061 Spinal stenosis, lumbar region without neurogenic claudication: Secondary | ICD-10-CM | POA: Diagnosis not present

## 2022-05-31 DIAGNOSIS — E876 Hypokalemia: Secondary | ICD-10-CM | POA: Diagnosis not present

## 2022-05-31 DIAGNOSIS — M5430 Sciatica, unspecified side: Secondary | ICD-10-CM | POA: Diagnosis not present

## 2022-05-31 DIAGNOSIS — R410 Disorientation, unspecified: Secondary | ICD-10-CM | POA: Diagnosis not present

## 2022-05-31 DIAGNOSIS — M21619 Bunion of unspecified foot: Secondary | ICD-10-CM | POA: Diagnosis not present

## 2022-05-31 DIAGNOSIS — Z87440 Personal history of urinary (tract) infections: Secondary | ICD-10-CM | POA: Diagnosis not present

## 2022-05-31 DIAGNOSIS — E1151 Type 2 diabetes mellitus with diabetic peripheral angiopathy without gangrene: Secondary | ICD-10-CM | POA: Diagnosis not present

## 2022-05-31 DIAGNOSIS — G4700 Insomnia, unspecified: Secondary | ICD-10-CM | POA: Diagnosis not present

## 2022-05-31 DIAGNOSIS — E1140 Type 2 diabetes mellitus with diabetic neuropathy, unspecified: Secondary | ICD-10-CM | POA: Diagnosis not present

## 2022-06-01 ENCOUNTER — Non-Acute Institutional Stay: Payer: Medicare Other | Admitting: Family Medicine

## 2022-06-01 VITALS — Temp 98.4°F | Resp 16

## 2022-06-01 DIAGNOSIS — F03918 Unspecified dementia, unspecified severity, with other behavioral disturbance: Secondary | ICD-10-CM | POA: Diagnosis not present

## 2022-06-01 DIAGNOSIS — F01B2 Vascular dementia, moderate, with psychotic disturbance: Secondary | ICD-10-CM | POA: Diagnosis not present

## 2022-06-01 DIAGNOSIS — I10 Essential (primary) hypertension: Secondary | ICD-10-CM | POA: Diagnosis not present

## 2022-06-01 DIAGNOSIS — E1151 Type 2 diabetes mellitus with diabetic peripheral angiopathy without gangrene: Secondary | ICD-10-CM | POA: Diagnosis not present

## 2022-06-01 DIAGNOSIS — E1140 Type 2 diabetes mellitus with diabetic neuropathy, unspecified: Secondary | ICD-10-CM | POA: Diagnosis not present

## 2022-06-01 DIAGNOSIS — F01B3 Vascular dementia, moderate, with mood disturbance: Secondary | ICD-10-CM | POA: Diagnosis not present

## 2022-06-01 DIAGNOSIS — I2510 Atherosclerotic heart disease of native coronary artery without angina pectoris: Secondary | ICD-10-CM | POA: Diagnosis not present

## 2022-06-02 ENCOUNTER — Encounter: Payer: Self-pay | Admitting: Family Medicine

## 2022-06-02 NOTE — Progress Notes (Signed)
[facility] Pall[facility] Consult Note Telephone: [phone]  Fax: [phone]    Date of encounter: 06/02/22 11:52 PM PATIENT NAME: [name] [id number]   [phone] (home)  DOB: 06-22-1930 MRN: [id number] PRIMARY CARE PROVIDER:    Binnie Rail, MD,  [place] [address] [phone]  REFERRING PROVIDER:   Binnie Rail, MD [address] [place],  [place] [address] [phone]  RESPONSIBLE PARTY:    Contact Information     Name Relation Home Work [place] Daughter   [phone]   Miracle, Rabold   [phone]        I met face to face with patient in [facility] facility. Palliative Care was asked to follow this patient by consultation request of  Burns, Claudina Lick, MD to address advance care planning and complex medical decision making. This is a follow up visit   ASSESSMENT , SYMPTOM MANAGEMENT AND PLAN / RECOMMENDATIONS:   Dementia with behavioral disturbance Improved continue current tx with Seroquel, Namenda, Aricept and Risperidone Currently thriving more in current environment    Advance Care Planning/Goals of Care: Goals include to maximize quality of life and symptom management.  Identification of a healthcare agent-daughter [facility] POA)-Dr Guadlupe Spanish  CODE STATUS: Full Code at present    Follow up Palliative Care Visit: Palliative care will continue to follow for complex medical decision making, advance care planning, and clarification of goals.  Return 4-6 weeks or prn.    This visit was coded based on medical decision making (MDM).  PPS: 50%  HOSPICE ELIGIBILITY/DIAGNOSIS: TBD  Chief Complaint:  Palliative Care continues to follow up with patient in [facility] memory care for chronic medical management in setting of dementia.   HISTORY OF PRESENT ILLNESS:  [name] is a 89 y.o. year old female  with dementia with hx of behavioral disturbance intolerant of depakote with sedation, neutropenia and thrombocytopenia. On arrival facility staff  was painting pt's nails which she appeared to be fully enjoying, smiling and making jokes in the common area.  Staff indicates she is eating well, they do not believe that she has lost any weight and has been cooperative with care and been involved in facility social activities. She was pleasant and smiling on approach.  Pt denies pain, SOB, nausea/vomiting, dysuria or constipation.  Denies recent falls.    History obtained from review of EMR, discussion with facility staff and/or Ms. Bevilacqua.     I reviewed EMR for available labs, medications, imaging, studies and related documents.  No new records since last visit.   ROS General: NAD Cardiovascular: denies chest pain, denies DOE Pulmonary: denies cough, denies SOB Abdomen: endorses good appetite, denies constipation, endorses continence of bowel GU: denies dysuria, per staff incontinent of urine intermittently but mostly continent MSK:  no falls reported, denies pain Skin: denies rashes or wounds Neurological: denies insomnia Psych: Endorses positive mood Heme/lymph/immuno: denies bruises, abnormal bleeding  Physical Exam: Constitutional: NAD General:  thin, bright and smiling CV: S1S2, RRR, no LE edema Pulmonary: CTAB, no increased work of breathing, no cough, room air Abdomen: normo-active BS + 4 quadrants, soft and non tender MSK: no sarcopenia, moves all extremities, ambulatory with rollator.  Some angulation noted of right wrist but no edema or increased warmth, no visible erythema Skin: warm and dry, no rashes or wounds on visible skin Neuro:  no generalized weakness, noted significant  cognitive impairment Psych: non-anxious affect, Pleasantly confused, A and O to self Hem/lymph/immuno: no widespread bruising   Thank you for the opportunity to participate in the care of Ms.  Dowdell.  The palliative care team will continue to follow. Please call our office at [phone] if we can be of additional assistance.   Marijo Conception, FNP -C  COVID-19 PATIENT SCREENING TOOL Asked and negative response unless otherwise noted:   Have you had symptoms of covid, tested positive or been in contact with someone with symptoms/positive test in the past 5-10 days?  Unknown

## 2022-06-02 NOTE — Progress Notes (Incomplete)
[facility] Consult Note Telephone: [phone]  Fax: [phone]    Date of encounter: 06/02/22 11:52 PM PATIENT NAME: [name] [id number]   [phone] (home)  DOB: 1931/04/12 MRN: [id number] PRIMARY CARE PROVIDER:    Binnie Rail, MD,  [place] [address] [phone]  REFERRING PROVIDER:   Binnie Rail, MD [address] [place],  [place] [address] [phone]  RESPONSIBLE PARTY:    Contact Information     Name Relation Home Work [place] Daughter   [phone]   [place], Yeater   [phone]        I met face to face with patient in [facility] facility. Palliative Care was asked to follow this patient by consultation request of  Burns, Claudina Lick, MD to address advance care planning and complex medical decision making. This is a follow up visit   ASSESSMENT , SYMPTOM MANAGEMENT AND PLAN / RECOMMENDATIONS:   Dementia with behavioral disturbance Improved continue current tx with Seroquel, Namenda, Aricept and Risperidone Currently thriving more in current environment    Advance Care Planning/Goals of Care: Goals include to maximize quality of life and symptom management.  Identification of a healthcare agent-daughter [facility] POA)-Dr Guadlupe Spanish  CODE STATUS: Full Code at present    Follow up Palliative Care Visit: Palliative care will continue to follow for complex medical decision making, advance care planning, and clarification of goals.  Return 4-6 weeks or prn.    This visit was coded based on medical decision making (MDM).  PPS: 50%  HOSPICE ELIGIBILITY/DIAGNOSIS: TBD  Chief Complaint:  Palliative Care continues to follow up with patient in [facility] memory care for chronic medical management in setting of dementia.   HISTORY OF PRESENT ILLNESS:  [name] is a 89 y.o. year old female  with dementia with hx of behavioral disturbance intolerant of depakote with sedation, neutropenia and thrombocytopenia. On arrival facility staff  was painting pt's nails which she appeared to be fully enjoying, smiling and making jokes in the common area.  Staff indicates she is eating well, they do not believe that she has lost any weight and has been cooperative with care and been involved in facility social activities. She was pleasant and smiling on approach.  Pt denies pain, SOB, nausea/vomiting, dysuria or constipation.  Denies recent falls.    History obtained from review of EMR, discussion with facility staff and/or [name].     I reviewed EMR for available labs, medications, imaging, studies and related documents.  No new ecords reviewed and summarized above.   ROS General: NAD Cardiovascular: denies chest pain, denies DOE Pulmonary: denies cough, denies SOB Abdomen: endorses good appetite, denies constipation, endorses continence of bowel GU: denies dysuria, per staff incontinent of urine MSK:  no falls reported, pain with certain positions/movement of right hand/wrist Skin: denies rashes or wounds Neurological: denies insomnia Psych: Endorses positive mood Heme/lymph/immuno: denies bruises, abnormal bleeding  Physical Exam: Current and past weights: Constitutional: NAD General: frail appearing, thin  CV: S1S2, RRR, no LE edema Pulmonary: CTAB, no increased work of breathing, no cough, room air Abdomen: normo-active BS + 4 quadrants, soft and non tender GU: No CVAT MSK: no sarcopenia, moves all extremities, ambulatory with rollator.  Right wrist and hand edematous and warm, no visible erythema Skin: warm and dry, no rashes or wounds on visible skin Neuro:  no generalized  weakness, noted significant cognitive impairment Psych: non-anxious affect, A and O to self Hem/lymph/immuno: no widespread bruising   Thank you for the opportunity to participate in the care of Ms.  Flynn.  The palliative care team will continue to follow. Please call our office at [phone] if we can be of additional assistance.   Marijo Conception, FNP -C  COVID-19 PATIENT SCREENING TOOL Asked and negative response unless otherwise noted:   Have you had symptoms of covid, tested positive or been in contact with someone with symptoms/positive test in the past 5-10 days?  Unknown

## 2022-06-06 DIAGNOSIS — L89611 Pressure ulcer of right heel, stage 1: Secondary | ICD-10-CM | POA: Diagnosis not present

## 2022-06-06 DIAGNOSIS — I7091 Generalized atherosclerosis: Secondary | ICD-10-CM | POA: Diagnosis not present

## 2022-06-06 DIAGNOSIS — B351 Tinea unguium: Secondary | ICD-10-CM | POA: Diagnosis not present

## 2022-06-08 DIAGNOSIS — F01B3 Vascular dementia, moderate, with mood disturbance: Secondary | ICD-10-CM | POA: Diagnosis not present

## 2022-06-08 DIAGNOSIS — I10 Essential (primary) hypertension: Secondary | ICD-10-CM | POA: Diagnosis not present

## 2022-06-08 DIAGNOSIS — E1140 Type 2 diabetes mellitus with diabetic neuropathy, unspecified: Secondary | ICD-10-CM | POA: Diagnosis not present

## 2022-06-08 DIAGNOSIS — I2510 Atherosclerotic heart disease of native coronary artery without angina pectoris: Secondary | ICD-10-CM | POA: Diagnosis not present

## 2022-06-08 DIAGNOSIS — E1151 Type 2 diabetes mellitus with diabetic peripheral angiopathy without gangrene: Secondary | ICD-10-CM | POA: Diagnosis not present

## 2022-06-08 DIAGNOSIS — F01B2 Vascular dementia, moderate, with psychotic disturbance: Secondary | ICD-10-CM | POA: Diagnosis not present

## 2022-06-12 ENCOUNTER — Non-Acute Institutional Stay: Payer: No Typology Code available for payment source | Admitting: Family Medicine

## 2022-06-12 VITALS — HR 72 | Temp 98.2°F | Resp 16

## 2022-06-12 DIAGNOSIS — F03918 Unspecified dementia, unspecified severity, with other behavioral disturbance: Secondary | ICD-10-CM

## 2022-06-12 DIAGNOSIS — L89610 Pressure ulcer of right heel, unstageable: Secondary | ICD-10-CM | POA: Diagnosis not present

## 2022-06-12 HISTORY — DX: Pressure ulcer of right heel, unstageable: L89.610

## 2022-06-14 ENCOUNTER — Encounter: Payer: Self-pay | Admitting: Family Medicine

## 2022-06-14 NOTE — Progress Notes (Signed)
[facility] Consult Note Telephone: [phone]  Fax: [phone]   Date of encounter: 06/14/22 9:54 AM PATIENT NAME: [name] [place] [id number]   [phone] (home)  DOB: 01-02-1931 MRN: [id number] PRIMARY CARE PROVIDER:    Binnie Rail, MD,  [place] [address] [phone]  REFERRING PROVIDER:   Binnie Rail, MD [address] [place],  [place] [address] [phone]   I met face to face with patient in [facility] facility. Palliative Care was asked to follow this patient by consultation request of Burns, Claudina Lick, MD to address advance care planning and complex medical decision making. This is an acute follow up visit. Daughter was updated at conclusion of visit.  ADVANCE CARE PLANNING/GOALS OF CARE:  HEALTH CARE POA/PHONE NUMBER:  daughter, Dr Guadlupe Spanish, [phone]  Review of living will/HC POA:  Grants authority for [place] to consent, refuse or withdraw consent any and all types of medical care including but not limited to artificial respiration, nutritional support and hydration, and CPR. Has indicated she does not want life-saving treatment: If I have a condition that is incurable, irreversible and without life sustaining treatment is expected to result in death within a relatively short time or if I am in a coma or persistent vegetative state which is reasonably concluded to be irreversible.  CODE STATUS: Full Code  ASSESSMENT AND / RECOMMENDATIONS:  PPS: 50% Pressure ulcer right heel, unstageable Continue follow up with Podiatry as scheduled. Since podiatrist did not recommend debridement, have advised staff to clean daily with saline, pat dry and paint with betadine. Recommend floating heels when in bed and some pressure offloading intermittently during the day. Monitor for drainage, erythema, increased pain or size of wound.  Dementia with  Behavioral Disturbance Stable at FAST 7 score 6b Continue Seroquel, Namenda, Aricept and Risperidone Thriving in current environment without behavioral disturbance at present.   Follow up Palliative Care Visit:  Palliative Care continuing to follow up by monitoring for changes in appetite, weight, functional and cognitive status for chronic disease progression and management in agreement with patient's stated goals of care. Next visit in 2 weeks or prn.  This visit was coded based on medical decision making (MDM).  Chief Complaint: Daughter called indicating pt has a right heel wound and was having some drainage oozing from it.  HISTORY OF PRESENT ILLNESS: [name] is a 89 y.o. year old female with recurrence of heel pressure ulcer, covered with black eschar.  She has been following up with Podiatrist who indicated to patient's daughter that pressure ulcer was stable for now unless it began draining and encouraged her to purchase a specific pillow to be able to float patient's heels.  Daughter has supplied pillow to facility.  Pt seen socializing in main area of facility with other residents and having a snack.  She remembers this provider and is happy discussing her recent birthday but states she doesn't remember how old she is currently. She is pleasant with other residents. She states some pain in her right heel particularly with application of pressure on the area.  Facility staff indicates pt has been using pillow supplied by daughter to float heels when in bed.   ACTIVITIES OF DAILY LIVING: CONTINENT OF BLADDER? intermittent CONTINENT OF BOWEL? yes  MOBILITY:   INDEPENDENTLY AMBULATORY?  No  AMBULATORY WITH ASSISTIVE DEVICE: rollator  APPETITE? good   CURRENT PROBLEM LIST:  Patient Active  Problem List   Diagnosis Date Noted   Pressure ulcer of heel, right, unstageable ([place]) 06/12/2022   Osteoarthritis 02/02/2022   Type 2 diabetes mellitus ([place]) 01/31/2022   Drug reaction  01/30/2022   Thrombocytopenia ([place]) 01/08/2022   Palliative care encounter 09/21/2021   Urinary frequency 09/18/2020   Aortic atherosclerosis ([place]) 08/07/2020   Dementia with behavioral disturbance ([place]) 07/27/2017   Chronic RUQ pain 05/28/2016   Right knee pain 06/14/2015   Diabetic neuropathy ([place]) 03/21/2014   Bunion 03/21/2014   PVD (pulmonary valve disease) 01/23/2014   Onychomycosis 12/20/2013   PVD (peripheral vascular disease) ([place]) 12/19/2013   Gout 12/13/2013   Depression    Lumbar canal stenosis 05/26/2013   Neuralgia neuritis, sciatic nerve 05/26/2013   Degenerative arthritis of lumbar spine with cord compression 04/24/2013   Arthritis of shoulder region, left 09/11/2011   Type II diabetes mellitus with neurological manifestations ([place]) 01/17/2009   Anemia 10/15/2008   CAD, NATIVE VESSEL 06/09/2008   Hyperlipidemia 06/15/2007   Essential hypertension 06/15/2007   GERD 06/15/2007   TUBULOVILLOUS ADENOMA, 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   DIVERTICULOSIS, 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   PAST MEDICAL HISTORY:  Active Ambulatory Problems    Diagnosis Date Noted   TUBULOVILLOUS ADENOMA, 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   Type II diabetes mellitus with neurological manifestations ([place]) 01/17/2009   Hyperlipidemia 06/15/2007   Anemia 10/15/2008   Essential hypertension 06/15/2007   CAD, NATIVE VESSEL 06/09/2008   GERD 06/15/2007   DIVERTICULOSIS, 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   Arthritis of shoulder region, left 09/11/2011   Degenerative arthritis of lumbar spine with cord compression 04/24/2013   Depression    Gout 12/13/2013   PVD (peripheral vascular disease) ([place]) 12/19/2013   Onychomycosis 12/20/2013   PVD (pulmonary valve disease) 01/23/2014   Diabetic neuropathy ([place]) 03/21/2014   Bunion 03/21/2014   Right knee pain 06/14/2015   Chronic RUQ pain 05/28/2016   Dementia with behavioral disturbance ([employer]) 07/27/2017   Aortic atherosclerosis ([place]) 08/07/2020   Urinary frequency 09/18/2020   Lumbar canal stenosis  05/26/2013   Neuralgia neuritis, sciatic nerve 05/26/2013   Palliative care encounter 09/21/2021   Thrombocytopenia ([place]) 01/08/2022   Drug reaction 01/30/2022   Type 2 diabetes mellitus ([place]) 01/31/2022   Osteoarthritis 02/02/2022   Pressure ulcer of heel, right, unstageable ([place]) 06/12/2022   Resolved Ambulatory Problems    Diagnosis Date Noted   Dermatophytosis of nail 02/20/2009   HEMORRHOIDS, INTERNAL 03/02/2007   GASTRITIS 03/12/2009   HIATAL HERNIA 10/29/2000   UNSPECIFIED HEMORRHAGE OF GASTROINTESTINAL TRACT 12/21/2008   DRY SKIN 02/20/2009   ARTHRITIS 06/15/2007   Enthesopathy of hip region 11/28/2009   Fibromyalgia syndrome 06/15/2007   INSOMNIA-SLEEP DISORDER-UNSPEC 09/18/2009   Headache(784.0) 04/08/2009   PALPITATIONS, RECURRENT 04/15/2009   Wheezing 09/18/2009   FECAL OCCULT BLOOD 01/16/2009   COLONIC POLYPS, HX OF 09/18/2008   Memory loss 07/23/2010   Palpitations 07/06/2012   Sciatica 09/05/2012   Sacroiliac joint dysfunction of right side 03/22/2013   Non-compliant behavior 12/05/2013   Radiculopathy of lumbar region 01/31/2014   HAV (hallux abducto valgus) 03/21/2014   Tenosynovitis of ankle 03/21/2014   Pronation deformity of ankle, acquired 03/21/2014   Chest pain 04/17/2014   Pain in lower limb 07/18/2014   Non-compliant behavior 11/23/2014   Acute bronchitis 12/03/2014   Cough 05/02/2015   Abdominal pain, epigastric 09/02/2015   AP (abdominal pain) 09/02/2015   Mild cognitive impairment 12/12/2015   Buttock pain 10/30/2016   Abscess of right foot 12/02/2017   Cellulitis of foot 12/05/2017  Benign hypertension 01/04/2014   Delirium with dementia 01/08/2022   Encounter for therapeutic drug level monitoring 01/13/2022   Encounter for testing for latent tuberculosis 01/13/2022   Acute respiratory failure with hypoxia ([place]) 01/30/2022   Dehydration 01/30/2022   AKI (acute kidney injury) ([place]) 01/31/2022   UTI (urinary tract infection) 123XX123    Acute metabolic encephalopathy 123XX123   Hypoxia 01/31/2022   Hypokalemia 01/31/2022   Acute cystitis without hematuria 02/04/2022   Abnormal urine odor 03/18/2022   Wrist pain, acute, right 03/18/2022   Past Medical History:  Diagnosis Date   DIABETES MELLITUS, TYPE II    FIBROMYALGIA    Gout    HYPERLIPIDEMIA    HYPERTENSION    Insomnia    OA (osteoarthritis)    S/P CABG x 1    S/P inguinal hernia repair    UNSPECIFIED ANEMIA    SOCIAL HX:  Social History   Tobacco Use   Smoking status: Former    Types: Cigarettes    Quit date: 04/26/1970    Years since quitting: 52.1   Smokeless tobacco: Never  Substance Use Topics   Alcohol use: Not Currently    Alcohol/week: 1.0 standard drink of alcohol    Types: 1 Glasses of wine per week   FAMILY HX:  Family History  Problem Relation Age of Onset   Cancer Mother        Stomach   Stomach cancer Mother 65   Hypertension Mother    Heart disease Father 45       MI   Diabetes Father    Heart attack Father    Vascular Disease Sister    Stroke Sister    Dementia Sister    Diabetes Sister    Parkinsonism Sister    Stroke Sister    Heart disease Sister    Heart attack Brother    Stroke Daughter    Diabetes Daughter    Hypertension Daughter    Colon cancer Neg Hx       Preferred Pharmacy: ALLERGIES:  Allergies  Allergen Reactions   Depakote [Divalproex Sodium] Other (See Comments)    Had worsening neutropenia and thrombocytopenia on medication.  Damaris Hippo FNP-C     PERTINENT MEDICATIONS:  Outpatient Encounter Medications as of 06/12/2022  Medication Sig   acetaminophen (TYLENOL) 500 MG tablet Take 1,000 mg by mouth 2 (two) times daily.    amLODipine (NORVASC) 5 MG tablet Take 1 tablet (5 mg total) by mouth 2 (two) times daily.   aspirin EC 81 MG tablet Take 81 mg by mouth daily.   busPIRone (BUSPAR) 15 MG tablet Take 1 tablet (15 mg total) by mouth 2 (two) times daily.   carvedilol (COREG) 6.25 MG tablet  TAKE ONE TABLET BY MOUTH TWICE A DAY   cetirizine (ZYRTEC) 10 MG tablet TAKE ONE TABLET BY MOUTH DAILY (Patient taking differently: Take 10 mg by mouth at bedtime.)   Cholecalciferol (D3-1000) 25 MCG (1000 UT) capsule Take 1,000 Units by mouth daily.   donepezil (ARICEPT) 10 MG tablet TAKE ONE TABLET BY MOUTH DAILY   ferrous sulfate 324 MG TBEC Take 324 mg by mouth daily.   gabapentin (NEURONTIN) 300 MG capsule Take 1 capsule (300 mg total) by mouth at bedtime.   MELATONIN PO Take 3 mg by mouth at bedtime.   meloxicam (MOBIC) 7.5 MG tablet Take 1 tablet (7.5 mg total) by mouth daily as needed for pain.   memantine (NAMENDA) 10 MG tablet Take 1 tablet (10  mg total) by mouth 2 (two) times daily.   omeprazole (PRILOSEC) 40 MG capsule Take 1 capsule (40 mg total) by mouth daily.   QUEtiapine (SEROQUEL) 50 MG tablet Take 1 tablet (50 mg total) by mouth at bedtime.   risperiDONE (RISPERDAL) 0.25 MG tablet Take 0.25 mg by mouth at bedtime.   No facility-administered encounter medications on file as of 06/12/2022.    History obtained from review of EMR, discussion with daughter, facility staff and/or patient.     I reviewed available labs, medications, imaging, studies and related documents from the EMR.  There were no new records/imaging since last visit. Records reviewed and summarized above.   Physical Exam: GENERAL: NAD LUNGS: CTAB, no increased work of breathing, room air CARDIAC:  S1S2, RRR with no MRG, trace pedal edema of right foot and lower leg to mid shin, No cyanosis SKIN: approximate 2.5 x 4 cm pressure ulcer at bottom edge/lateral right heel with black eschar covering.  Eschar slightly loosened at left upper border of lesion, no active drainage noted, small area of prior drainage noted in slipper which is without discoloration.  Bilat feet are lukewarm. No lesion noted on left heel, has approximate 5 mm area of hypopigmentation noted on top of distal 2nd toe left foot NEURO:  No  weakness, Noted cognitive impairment  PSYCH:  non-anxious affect, A & O x 1, pleasant mood  Thank you for the opportunity to participate in the care of [name]. Please call our main office at [phone] if we can be of additional assistance.    Damaris Hippo FNP-C  [email or web address] .[facility]  Phone:  [phone]

## 2022-06-15 DIAGNOSIS — E1151 Type 2 diabetes mellitus with diabetic peripheral angiopathy without gangrene: Secondary | ICD-10-CM | POA: Diagnosis not present

## 2022-06-15 DIAGNOSIS — E1140 Type 2 diabetes mellitus with diabetic neuropathy, unspecified: Secondary | ICD-10-CM | POA: Diagnosis not present

## 2022-06-15 DIAGNOSIS — I2510 Atherosclerotic heart disease of native coronary artery without angina pectoris: Secondary | ICD-10-CM | POA: Diagnosis not present

## 2022-06-15 DIAGNOSIS — F01B2 Vascular dementia, moderate, with psychotic disturbance: Secondary | ICD-10-CM | POA: Diagnosis not present

## 2022-06-15 DIAGNOSIS — I10 Essential (primary) hypertension: Secondary | ICD-10-CM | POA: Diagnosis not present

## 2022-06-15 DIAGNOSIS — F01B3 Vascular dementia, moderate, with mood disturbance: Secondary | ICD-10-CM | POA: Diagnosis not present

## 2022-06-20 ENCOUNTER — Encounter (HOSPITAL_COMMUNITY): Payer: Self-pay

## 2022-06-20 ENCOUNTER — Emergency Department (HOSPITAL_COMMUNITY)
Admission: EM | Admit: 2022-06-20 | Discharge: 2022-06-20 | Disposition: A | Discharge status: Home / Self Care | Payer: Medicare Other | Attending: Emergency Medicine | Admitting: Emergency Medicine

## 2022-06-20 ENCOUNTER — Emergency Department (HOSPITAL_COMMUNITY): Payer: Medicare Other

## 2022-06-20 DIAGNOSIS — L089 Local infection of the skin and subcutaneous tissue, unspecified: Secondary | ICD-10-CM

## 2022-06-20 DIAGNOSIS — Z951 Presence of aortocoronary bypass graft: Secondary | ICD-10-CM | POA: Insufficient documentation

## 2022-06-20 DIAGNOSIS — Z79899 Other long term (current) drug therapy: Secondary | ICD-10-CM | POA: Insufficient documentation

## 2022-06-20 DIAGNOSIS — I10 Essential (primary) hypertension: Secondary | ICD-10-CM | POA: Diagnosis not present

## 2022-06-20 DIAGNOSIS — Z7982 Long term (current) use of aspirin: Secondary | ICD-10-CM | POA: Diagnosis not present

## 2022-06-20 DIAGNOSIS — R600 Localized edema: Secondary | ICD-10-CM | POA: Diagnosis not present

## 2022-06-20 DIAGNOSIS — Z5189 Encounter for other specified aftercare: Secondary | ICD-10-CM | POA: Insufficient documentation

## 2022-06-20 DIAGNOSIS — E119 Type 2 diabetes mellitus without complications: Secondary | ICD-10-CM | POA: Insufficient documentation

## 2022-06-20 DIAGNOSIS — I2510 Atherosclerotic heart disease of native coronary artery without angina pectoris: Secondary | ICD-10-CM | POA: Diagnosis not present

## 2022-06-20 DIAGNOSIS — M79671 Pain in right foot: Secondary | ICD-10-CM | POA: Diagnosis not present

## 2022-06-20 LAB — CBC WITH DIFFERENTIAL/PLATELET
Abs Immature Granulocytes: 0.02 10*3/uL (ref 0.00–0.07)
Basophils Absolute: 0 10*3/uL (ref 0.0–0.1)
Basophils Relative: 1 %
Eosinophils Absolute: 0.2 10*3/uL (ref 0.0–0.5)
Eosinophils Relative: 3 %
HCT: 36.2 % (ref 36.0–46.0)
Hemoglobin: 11.4 g/dL — ABNORMAL LOW (ref 12.0–15.0)
Immature Granulocytes: 0 %
Lymphocytes Relative: 29 %
Lymphs Abs: 1.8 10*3/uL (ref 0.7–4.0)
MCH: 30.3 pg (ref 26.0–34.0)
MCHC: 31.5 g/dL (ref 30.0–36.0)
MCV: 96.3 fL (ref 80.0–100.0)
Monocytes Absolute: 0.6 10*3/uL (ref 0.1–1.0)
Monocytes Relative: 10 %
Neutro Abs: 3.7 10*3/uL (ref 1.7–7.7)
Neutrophils Relative %: 57 %
Platelets: 173 10*3/uL (ref 150–400)
RBC: 3.76 MIL/uL — ABNORMAL LOW (ref 3.87–5.11)
RDW: 15.6 % — ABNORMAL HIGH (ref 11.5–15.5)
WBC: 6.3 10*3/uL (ref 4.0–10.5)
nRBC: 0 % (ref 0.0–0.2)

## 2022-06-20 LAB — BASIC METABOLIC PANEL
Anion gap: 7 (ref 5–15)
BUN: 17 mg/dL (ref 8–23)
CO2: 27 mmol/L (ref 22–32)
Calcium: 8.6 mg/dL — ABNORMAL LOW (ref 8.9–10.3)
Chloride: 103 mmol/L (ref 98–111)
Creatinine, Ser: 1.03 mg/dL — ABNORMAL HIGH (ref 0.44–1.00)
GFR, Estimated: 52 mL/min — ABNORMAL LOW (ref >60)
Glucose, Bld: 101 mg/dL — ABNORMAL HIGH (ref 70–99)
Potassium: 3.5 mmol/L (ref 3.5–5.1)
Sodium: 137 mmol/L (ref 135–145)

## 2022-06-20 LAB — SEDIMENTATION RATE: Sed Rate: 12 mm/hr (ref 0–22)

## 2022-06-20 LAB — C-REACTIVE PROTEIN: CRP: 1.2 mg/dL — ABNORMAL HIGH (ref <1.0)

## 2022-06-20 MED ORDER — AMOXICILLIN-POT CLAVULANATE 875-125 MG PO TABS: 1.0000 | ORAL_TABLET | Freq: Two times a day (BID) | ORAL | 0 refills | Status: DC

## 2022-06-20 NOTE — ED Notes (Signed)
Xray complete. EDP into room, at [facility]. Images taken/ uploaded into chart.

## 2022-06-20 NOTE — Discharge Instructions (Addendum)
[facility] as a local podiatry group although they are not on-call today.  Watch for worsening signs of infection.  The antibiotic should help.  Change dressings on the wound daily.

## 2022-06-20 NOTE — ED Provider Notes (Signed)
[place] AT [facility] Provider Note   CSN: [id number] Arrival date & time: 06/20/22  1344     History  Chief Complaint  Patient presents with   Wound Check    [name] is a 89 y.o. female.   Wound Check  Patient presents with wound on right heel.  Has had a eschar on the wound for a while now.  Has been told by podiatry that no surgery was planned unless something change.  Now having more drainage and more foul-smelling.  Patient otherwise feels fine.  Was seen by podiatry at the facility which she lives.    Past Medical History:  Diagnosis Date   Acute cystitis without hematuria 123XX123   Acute metabolic encephalopathy 123XX123   Acute respiratory failure with hypoxia ([place]) 01/30/2022   AKI (acute kidney injury) ([place]) 01/31/2022   CAD, NATIVE VESSEL 06/09/2008   Dehydration 01/30/2022   Delirium with dementia 01/08/2022   DIABETES MELLITUS, TYPE II    Encounter for testing for latent tuberculosis 01/13/2022   FIBROMYALGIA    GERD    Gout    HIATAL HERNIA    HYPERLIPIDEMIA    HYPERTENSION    Hypokalemia 01/31/2022   Insomnia    OA (osteoarthritis)    severe, shoulders, hands - inflammatory, ?RA   Pressure ulcer of heel, right, unstageable ([place]) 06/12/2022   S/P CABG x 1    S/P inguinal hernia repair    TUBULOVILLOUS ADENOMA, 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   colo 04/2012 - no polyps - no further screening planned (age >70)   UNSPECIFIED ANEMIA    Wrist pain, acute, right 03/18/2022    Home Medications Prior to Admission medications   Medication Sig Start Date End Date Taking? Authorizing Provider  acetaminophen (TYLENOL) 500 MG tablet Take 1,000 mg by mouth 2 (two) times daily.    Yes [provider]  amLODipine (NORVASC) 5 MG tablet Take 1 tablet (5 mg total) by mouth 2 (two) times daily. 03/19/22  Yes Burns, Claudina Lick, MD  amoxicillin-clavulanate (AUGMENTIN) 875-125 MG tablet Take 1 tablet by mouth every 12 (twelve)  hours. 06/20/22  Yes Davonna Belling, MD  aspirin EC 81 MG tablet Take 81 mg by mouth daily.   Yes [provider]  busPIRone (BUSPAR) 15 MG tablet Take 1 tablet (15 mg total) by mouth 2 (two) times daily. 12/12/21  Yes Cameron Sprang, MD  carvedilol (COREG) 6.25 MG tablet TAKE ONE TABLET BY MOUTH TWICE A DAY Patient taking differently: Take 6.25 mg by mouth 2 (two) times daily with a meal. 09/19/21  Yes Fay Records, MD  cetirizine (ZYRTEC) 10 MG tablet TAKE ONE TABLET BY MOUTH DAILY Patient taking differently: Take 10 mg by mouth daily. 02/22/17  Yes Burns, Claudina Lick, MD  Cholecalciferol (D3-1000) 25 MCG (1000 UT) capsule Take 1,000 Units by mouth daily.   Yes [provider]  donepezil (ARICEPT) 10 MG tablet TAKE ONE TABLET BY MOUTH DAILY Patient taking differently: Take 10 mg by mouth daily. TAKE ONE TABLET BY MOUTH DAILY 12/12/21  Yes Cameron Sprang, MD  ferrous sulfate 324 MG TBEC Take 324 mg by mouth daily.   Yes [provider]  gabapentin (NEURONTIN) 300 MG capsule Take 1 capsule (300 mg total) by mouth at bedtime. 03/19/22  Yes Burns, Claudina Lick, MD  MELATONIN PO Take 3 mg by mouth at bedtime.   Yes [provider]  meloxicam (MOBIC) 7.5 MG tablet Take 1 tablet (7.5  mg total) by mouth daily as needed for pain. 03/19/22  Yes Burns, Claudina Lick, MD  memantine (NAMENDA) 10 MG tablet Take 1 tablet (10 mg total) by mouth 2 (two) times daily. 12/12/21  Yes Cameron Sprang, MD  Menthol, Topical Analgesic, (BIOFREEZE) 4 % GEL Apply 1 Application topically in the morning, at noon, and at bedtime. To mid back and right wrist   Yes [provider]  omeprazole (PRILOSEC) 40 MG capsule Take 1 capsule (40 mg total) by mouth daily. 03/19/22  Yes Burns, Claudina Lick, MD  QUEtiapine (SEROQUEL) 50 MG tablet Take 1 tablet (50 mg total) by mouth at bedtime. 12/12/21  Yes Cameron Sprang, MD  risperiDONE (RISPERDAL) 0.25 MG tablet Take 0.25 mg by mouth daily.   Yes [provider]  zinc oxide 20 % ointment Apply 1 Application topically 2 (two) times daily.   Yes [provider]      Allergies    Depakote [divalproex sodium]    Review of Systems   Review of Systems  Physical Exam Updated Vital Signs BP (!) 178/74   Pulse 64   Temp 98.2 F (36.8 C) (Oral)   Resp 16   SpO2 99%  Physical Exam Vitals and nursing note reviewed.  Cardiovascular:     Rate and Rhythm: Regular rhythm.  Abdominal:     Tenderness: There is no abdominal tenderness.  Musculoskeletal:     Comments: Tenderness to right heel laterally.  Approximately 1 x 3 cm eschar that is somewhat foul-smelling and tender but no large amount of purulent drainage.  Neurological:     Mental Status: She is alert.     ED Results / Procedures / Treatments   Labs (all labs ordered are listed, but only abnormal results are displayed) Labs Reviewed  C-REACTIVE PROTEIN - Abnormal; Notable for the following components:      Result Value   CRP 1.2 (*)    All other components within normal limits  CBC WITH DIFFERENTIAL/PLATELET - Abnormal; Notable for the following components:   RBC 3.76 (*)    Hemoglobin 11.4 (*)    RDW 15.6 (*)    All other components within normal limits  BASIC METABOLIC PANEL - Abnormal; Notable for the following components:   Glucose, Bld 101 (*)    Creatinine, Ser 1.03 (*)    Calcium 8.6 (*)    GFR, Estimated 52 (*)    All other components within normal limits  SEDIMENTATION RATE    EKG None  Radiology DG Foot Complete Right  Result Date: 06/20/2022 CLINICAL DATA:  Right heel ulcer EXAM: RIGHT FOOT COMPLETE - 3+ VIEW COMPARISON:  None Available. FINDINGS: Fracture or dislocation of the right foot. Moderate first metatarsophalangeal and mild midfoot arthrosis. Plantar and Achilles calcaneal spurs. Diffuse soft tissue edema about the foot and ankle. Vascular calcinosis IMPRESSION: 1. No fracture or dislocation of the right foot. 2. Moderate first  metatarsophalangeal and mild midfoot arthrosis. 3. Diffuse soft tissue edema about the foot and ankle. Electronically Signed   By: Delanna Ahmadi M.D.   On: 06/20/2022 14:55    Procedures Procedures    Medications Ordered in ED Medications - No data to display  ED Course/ Medical Decision Making/ A&P                             Medical Decision Making Amount and/or Complexity of Data Reviewed Labs: ordered. Radiology: ordered.  Risk  Prescription drug management.   Patient with eschar foot.  Potential infection now.  More drainage.  Increasing pain.  However systemically is feeling well.  No fevers.  Will get basic blood work and x-ray.  Osteomyelitis considered but felt probably less likely.   Blood work reassuring.  White count normal as well as sed rate.  Doubt severe infection at this time but will treat with antibiotics.  Outpatient follow-up.  Family asked for podiatry that is local.  Given name of [facility] but informed that they were not on-call.  Doubt osteomyelitis.  Discharge home.       Final Clinical Impression(s) / ED Diagnoses Final diagnoses:  Wound infection    Rx / DC Orders ED Discharge Orders          Ordered    amoxicillin-clavulanate (AUGMENTIN) 875-125 MG tablet  Every 12 hours        06/20/22 1646              Davonna Belling, MD 06/20/22 2230

## 2022-06-20 NOTE — ED Triage Notes (Signed)
Pt arrived via POV with family, from [place]. C/o pressure wound to right heel. Follows with podiatry and told to come to ED for any drainage. States brown drainage for the last couple days and worsening pain. Denies any fevers, afebrile in triage

## 2022-06-22 ENCOUNTER — Telehealth: Payer: Self-pay | Admitting: Internal Medicine

## 2022-06-22 NOTE — Telephone Encounter (Signed)
Verbals left today. 

## 2022-06-22 NOTE — Telephone Encounter (Signed)
[place]:  [place]   Phone Number:  [phone]   Needs Verbal orders for what service & frequency:  Wound care to right heel -  initial assessment

## 2022-06-22 NOTE — Telephone Encounter (Signed)
Okay for orders? 

## 2022-06-23 DIAGNOSIS — I10 Essential (primary) hypertension: Secondary | ICD-10-CM | POA: Diagnosis not present

## 2022-06-23 DIAGNOSIS — F01B3 Vascular dementia, moderate, with mood disturbance: Secondary | ICD-10-CM | POA: Diagnosis not present

## 2022-06-23 DIAGNOSIS — E1151 Type 2 diabetes mellitus with diabetic peripheral angiopathy without gangrene: Secondary | ICD-10-CM | POA: Diagnosis not present

## 2022-06-23 DIAGNOSIS — F01B2 Vascular dementia, moderate, with psychotic disturbance: Secondary | ICD-10-CM | POA: Diagnosis not present

## 2022-06-23 DIAGNOSIS — E1140 Type 2 diabetes mellitus with diabetic neuropathy, unspecified: Secondary | ICD-10-CM | POA: Diagnosis not present

## 2022-06-23 DIAGNOSIS — I2510 Atherosclerotic heart disease of native coronary artery without angina pectoris: Secondary | ICD-10-CM | POA: Diagnosis not present

## 2022-06-23 DIAGNOSIS — L89899 Pressure ulcer of other site, unspecified stage: Secondary | ICD-10-CM | POA: Diagnosis not present

## 2022-06-24 DIAGNOSIS — E1151 Type 2 diabetes mellitus with diabetic peripheral angiopathy without gangrene: Secondary | ICD-10-CM | POA: Diagnosis not present

## 2022-06-24 DIAGNOSIS — F01B3 Vascular dementia, moderate, with mood disturbance: Secondary | ICD-10-CM | POA: Diagnosis not present

## 2022-06-24 DIAGNOSIS — E1140 Type 2 diabetes mellitus with diabetic neuropathy, unspecified: Secondary | ICD-10-CM | POA: Diagnosis not present

## 2022-06-24 DIAGNOSIS — F01B2 Vascular dementia, moderate, with psychotic disturbance: Secondary | ICD-10-CM | POA: Diagnosis not present

## 2022-06-24 DIAGNOSIS — I10 Essential (primary) hypertension: Secondary | ICD-10-CM | POA: Diagnosis not present

## 2022-06-24 DIAGNOSIS — I2510 Atherosclerotic heart disease of native coronary artery without angina pectoris: Secondary | ICD-10-CM | POA: Diagnosis not present

## 2022-06-24 DIAGNOSIS — L89609 Pressure ulcer of unspecified heel, unspecified stage: Secondary | ICD-10-CM | POA: Diagnosis not present

## 2022-06-26 DIAGNOSIS — E1140 Type 2 diabetes mellitus with diabetic neuropathy, unspecified: Secondary | ICD-10-CM | POA: Diagnosis not present

## 2022-06-26 DIAGNOSIS — F01B2 Vascular dementia, moderate, with psychotic disturbance: Secondary | ICD-10-CM | POA: Diagnosis not present

## 2022-06-26 DIAGNOSIS — I2510 Atherosclerotic heart disease of native coronary artery without angina pectoris: Secondary | ICD-10-CM | POA: Diagnosis not present

## 2022-06-26 DIAGNOSIS — I10 Essential (primary) hypertension: Secondary | ICD-10-CM | POA: Diagnosis not present

## 2022-06-26 DIAGNOSIS — E1151 Type 2 diabetes mellitus with diabetic peripheral angiopathy without gangrene: Secondary | ICD-10-CM | POA: Diagnosis not present

## 2022-06-26 DIAGNOSIS — F01B3 Vascular dementia, moderate, with mood disturbance: Secondary | ICD-10-CM | POA: Diagnosis not present

## 2022-06-29 DIAGNOSIS — F01B2 Vascular dementia, moderate, with psychotic disturbance: Secondary | ICD-10-CM | POA: Diagnosis not present

## 2022-06-29 DIAGNOSIS — I2510 Atherosclerotic heart disease of native coronary artery without angina pectoris: Secondary | ICD-10-CM | POA: Diagnosis not present

## 2022-06-29 DIAGNOSIS — F01B3 Vascular dementia, moderate, with mood disturbance: Secondary | ICD-10-CM | POA: Diagnosis not present

## 2022-06-29 DIAGNOSIS — E1140 Type 2 diabetes mellitus with diabetic neuropathy, unspecified: Secondary | ICD-10-CM | POA: Diagnosis not present

## 2022-06-29 DIAGNOSIS — I10 Essential (primary) hypertension: Secondary | ICD-10-CM | POA: Diagnosis not present

## 2022-06-29 DIAGNOSIS — E1151 Type 2 diabetes mellitus with diabetic peripheral angiopathy without gangrene: Secondary | ICD-10-CM | POA: Diagnosis not present

## 2022-06-30 DIAGNOSIS — M48061 Spinal stenosis, lumbar region without neurogenic claudication: Secondary | ICD-10-CM | POA: Diagnosis not present

## 2022-06-30 DIAGNOSIS — L89610 Pressure ulcer of right heel, unstageable: Secondary | ICD-10-CM | POA: Diagnosis not present

## 2022-06-30 DIAGNOSIS — F32A Depression, unspecified: Secondary | ICD-10-CM | POA: Diagnosis not present

## 2022-06-30 DIAGNOSIS — M797 Fibromyalgia: Secondary | ICD-10-CM | POA: Diagnosis not present

## 2022-06-30 DIAGNOSIS — M21619 Bunion of unspecified foot: Secondary | ICD-10-CM | POA: Diagnosis not present

## 2022-06-30 DIAGNOSIS — M769 Unspecified enthesopathy, lower limb, excluding foot: Secondary | ICD-10-CM | POA: Diagnosis not present

## 2022-06-30 DIAGNOSIS — G4700 Insomnia, unspecified: Secondary | ICD-10-CM | POA: Diagnosis not present

## 2022-06-30 DIAGNOSIS — M19012 Primary osteoarthritis, left shoulder: Secondary | ICD-10-CM | POA: Diagnosis not present

## 2022-06-30 DIAGNOSIS — E1140 Type 2 diabetes mellitus with diabetic neuropathy, unspecified: Secondary | ICD-10-CM | POA: Diagnosis not present

## 2022-06-30 DIAGNOSIS — I130 Hypertensive heart and chronic kidney disease with heart failure and stage 1 through stage 4 chronic kidney disease, or unspecified chronic kidney disease: Secondary | ICD-10-CM | POA: Diagnosis not present

## 2022-06-30 DIAGNOSIS — E1122 Type 2 diabetes mellitus with diabetic chronic kidney disease: Secondary | ICD-10-CM | POA: Diagnosis not present

## 2022-06-30 DIAGNOSIS — M5430 Sciatica, unspecified side: Secondary | ICD-10-CM | POA: Diagnosis not present

## 2022-06-30 DIAGNOSIS — D631 Anemia in chronic kidney disease: Secondary | ICD-10-CM | POA: Diagnosis not present

## 2022-06-30 DIAGNOSIS — Z87440 Personal history of urinary (tract) infections: Secondary | ICD-10-CM | POA: Diagnosis not present

## 2022-06-30 DIAGNOSIS — I5030 Unspecified diastolic (congestive) heart failure: Secondary | ICD-10-CM | POA: Diagnosis not present

## 2022-06-30 DIAGNOSIS — I2510 Atherosclerotic heart disease of native coronary artery without angina pectoris: Secondary | ICD-10-CM | POA: Diagnosis not present

## 2022-06-30 DIAGNOSIS — E785 Hyperlipidemia, unspecified: Secondary | ICD-10-CM | POA: Diagnosis not present

## 2022-06-30 DIAGNOSIS — E1151 Type 2 diabetes mellitus with diabetic peripheral angiopathy without gangrene: Secondary | ICD-10-CM | POA: Diagnosis not present

## 2022-06-30 DIAGNOSIS — F01B2 Vascular dementia, moderate, with psychotic disturbance: Secondary | ICD-10-CM | POA: Diagnosis not present

## 2022-06-30 DIAGNOSIS — F01B3 Vascular dementia, moderate, with mood disturbance: Secondary | ICD-10-CM | POA: Diagnosis not present

## 2022-06-30 DIAGNOSIS — M4726 Other spondylosis with radiculopathy, lumbar region: Secondary | ICD-10-CM | POA: Diagnosis not present

## 2022-06-30 DIAGNOSIS — Z951 Presence of aortocoronary bypass graft: Secondary | ICD-10-CM | POA: Diagnosis not present

## 2022-06-30 DIAGNOSIS — N1831 Chronic kidney disease, stage 3a: Secondary | ICD-10-CM | POA: Diagnosis not present

## 2022-06-30 DIAGNOSIS — M109 Gout, unspecified: Secondary | ICD-10-CM | POA: Diagnosis not present

## 2022-06-30 DIAGNOSIS — K219 Gastro-esophageal reflux disease without esophagitis: Secondary | ICD-10-CM | POA: Diagnosis not present

## 2022-07-01 DIAGNOSIS — I130 Hypertensive heart and chronic kidney disease with heart failure and stage 1 through stage 4 chronic kidney disease, or unspecified chronic kidney disease: Secondary | ICD-10-CM | POA: Diagnosis not present

## 2022-07-01 DIAGNOSIS — I5030 Unspecified diastolic (congestive) heart failure: Secondary | ICD-10-CM | POA: Diagnosis not present

## 2022-07-01 DIAGNOSIS — E1151 Type 2 diabetes mellitus with diabetic peripheral angiopathy without gangrene: Secondary | ICD-10-CM | POA: Diagnosis not present

## 2022-07-01 DIAGNOSIS — L89610 Pressure ulcer of right heel, unstageable: Secondary | ICD-10-CM | POA: Diagnosis not present

## 2022-07-01 DIAGNOSIS — E1122 Type 2 diabetes mellitus with diabetic chronic kidney disease: Secondary | ICD-10-CM | POA: Diagnosis not present

## 2022-07-01 DIAGNOSIS — E1140 Type 2 diabetes mellitus with diabetic neuropathy, unspecified: Secondary | ICD-10-CM | POA: Diagnosis not present

## 2022-07-02 ENCOUNTER — Ambulatory Visit (INDEPENDENT_AMBULATORY_CARE_PROVIDER_SITE_OTHER): Payer: Medicare Other | Admitting: Podiatry

## 2022-07-02 DIAGNOSIS — L97412 Non-pressure chronic ulcer of right heel and midfoot with fat layer exposed: Secondary | ICD-10-CM | POA: Diagnosis not present

## 2022-07-02 DIAGNOSIS — I739 Peripheral vascular disease, unspecified: Secondary | ICD-10-CM

## 2022-07-02 NOTE — Patient Instructions (Signed)
Continue santyl dressing changes daily Offload heal at all times I have put in a referral to see [facility].   Monitor for any signs/symptoms of infection. Call the office immediately if any occur or go directly to the emergency room. Call with any questions/concerns.

## 2022-07-02 NOTE — Progress Notes (Signed)
Subjective:   Patient ID: [name], female   DOB: 89 y.o.   MRN: [id number]   HPI Chief Complaint  Patient presents with   Wound Check    Patient was given amoxicillin, she has completed the course. Wound is located on the right heel area, patient experiencing slight pain   89 year old female presents the office with her daughter for concerns of heel ulceration on the right side.  She was recently seen by another podiatrist for this.  The wound worsened and she was seen in the emergency room.  She was started on Augmentin.  She presents today for follow-up evaluation of the wound.  She does not report any fevers or chills.   She lives at [facility]  All other systems reviewed and are negative.  Past Medical History:  Diagnosis Date   Acute cystitis without hematuria 123XX123   Acute metabolic encephalopathy 123XX123   Acute respiratory failure with hypoxia ([place]) 01/30/2022   AKI (acute kidney injury) ([place]) 01/31/2022   CAD, NATIVE VESSEL 06/09/2008   Dehydration 01/30/2022   Delirium with dementia 01/08/2022   DIABETES MELLITUS, TYPE II    Encounter for testing for latent tuberculosis 01/13/2022   FIBROMYALGIA    GERD    Gout    HIATAL HERNIA    HYPERLIPIDEMIA    HYPERTENSION    Hypokalemia 01/31/2022   Insomnia    OA (osteoarthritis)    severe, shoulders, hands - inflammatory, ?RA   Pressure ulcer of heel, right, unstageable ([place]) 06/12/2022   S/P CABG x 1    S/P inguinal hernia repair    TUBULOVILLOUS ADENOMA, 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   colo 04/2012 - no polyps - no further screening planned (age >37)   UNSPECIFIED ANEMIA    Wrist pain, acute, right 03/18/2022    Past Surgical History:  Procedure Laterality Date   ABDOMINAL HYSTERECTOMY  1981   APPENDECTOMY     CATARACT EXTRACTION, BILATERAL     CORONARY ARTERY BYPASS GRAFT  2010   HERNIA REPAIR     INGUINAL   I & D EXTREMITY Left 12/07/2017   Procedure: IRRIGATION  AND DEBRIDEMENT EXTREMITY WITH FOREIGN BODY REMOVAL;  Surgeon: Nicholes Stairs, MD;  Location: [facility];  Service: Orthopedics;  Laterality: Left;   JOINT REPLACEMENT     OVARIAN CYST REMOVAL     REPLACEMENT TOTAL KNEE BILATERAL Bilateral    Rt-1974, Lt -1989   REVERSE SHOULDER ARTHROPLASTY  09/08/2011   Procedure: REVERSE SHOULDER ARTHROPLASTY;  Surgeon: Nita Sells, MD;  Location: [facility];  Service: Orthopedics;  Laterality: Left;  left reverse total shoulder arthroplasty   ROTATOR CUFF REPAIR     Left     Current Outpatient Medications:    acetaminophen (TYLENOL) 500 MG tablet, Take 1,000 mg by mouth 2 (two) times daily. , Disp: , Rfl:    amLODipine (NORVASC) 5 MG tablet, Take 1 tablet (5 mg total) by mouth 2 (two) times daily., Disp: 180 tablet, Rfl: 3   aspirin EC 81 MG tablet, Take 81 mg by mouth daily., Disp: , Rfl:    busPIRone (BUSPAR) 15 MG tablet, Take 1 tablet (15 mg total) by mouth 2 (two) times daily., Disp: 180 tablet, Rfl: 3   carvedilol (COREG) 6.25 MG tablet, TAKE ONE TABLET BY MOUTH TWICE A DAY (Patient taking differently: Take 6.25 mg by mouth 2 (two) times daily with a meal.), Disp: 180 tablet, Rfl: 3   cetirizine (ZYRTEC) 10  MG tablet, TAKE ONE TABLET BY MOUTH DAILY (Patient taking differently: Take 10 mg by mouth daily.), Disp: 90 tablet, Rfl: 3   Cholecalciferol (D3-1000) 25 MCG (1000 UT) capsule, Take 1,000 Units by mouth daily., Disp: , Rfl:    donepezil (ARICEPT) 10 MG tablet, TAKE ONE TABLET BY MOUTH DAILY (Patient taking differently: Take 10 mg by mouth daily. TAKE ONE TABLET BY MOUTH DAILY), Disp: 90 tablet, Rfl: 3   ferrous sulfate 324 MG TBEC, Take 324 mg by mouth daily., Disp: , Rfl:    gabapentin (NEURONTIN) 300 MG capsule, Take 1 capsule (300 mg total) by mouth at bedtime., Disp: 90 capsule, Rfl: 3   MELATONIN PO, Take 3 mg by mouth at bedtime., Disp: , Rfl:    meloxicam (MOBIC) 7.5 MG tablet, Take 1 tablet (7.5 mg total) by mouth daily as needed  for pain., Disp: 90 tablet, Rfl: 3   memantine (NAMENDA) 10 MG tablet, Take 1 tablet (10 mg total) by mouth 2 (two) times daily., Disp: 180 tablet, Rfl: 3   Menthol, Topical Analgesic, (BIOFREEZE) 4 % GEL, Apply 1 Application topically in the morning, at noon, and at bedtime. To mid back and right wrist, Disp: , Rfl:    omeprazole (PRILOSEC) 40 MG capsule, Take 1 capsule (40 mg total) by mouth daily., Disp: 90 capsule, Rfl: 3   QUEtiapine (SEROQUEL) 50 MG tablet, Take 1 tablet (50 mg total) by mouth at bedtime., Disp: 90 tablet, Rfl: 3   risperiDONE (RISPERDAL) 0.25 MG tablet, Take 0.25 mg by mouth daily., Disp: , Rfl:    zinc oxide 20 % ointment, Apply 1 Application topically 2 (two) times daily., Disp: , Rfl:   Allergies  Allergen Reactions   Depakote [Divalproex Sodium] Other (See Comments)    Had worsening neutropenia and thrombocytopenia on medication.  Damaris Hippo FNP-C          Objective:  Physical Exam  General: NAD  Dermatological: On the lateral aspect of the right heel there is a fibrotic wound.  There is no probing to bone but the wound is starting to probe.  There is no undermining or tunneling.  Compared to the picture from the emergency room the necrotic tissue has come off and now it is 100% fibrotic.  There is no surrounding erythema, ascending cellulitis.  There is no fluctuation or crepitation present moderate.  Wound measures approximately 3 x 1.8 cm.  There is no fluctuance or crepitation.  There is no malodor.   Vascular: Difficult to palpate pulses bilaterally.  Neruologic: Sensation decreased with Semmes Weinstein monofilament  Musculoskeletal: No pain on exam.     Assessment:   Right heel ulceration, PAD     Plan:  -Treatment options discussed including all alternatives, risks, and complications -Etiology of symptoms were discussed -Patient x-rays in the emergency room which were negative for osteomyelitis.  Upon my evaluation there is calcaneal  spurring present with vessel calcifications. -Lab work reviewed. -As able debride some of the fibrotic tissue today.  Patient already using Santyl to think is appropriate Continue with this.  Continue daily dressing changes. -Dispensed heel pillow for offloading -I do think she should be evaluated by vascular and get an ABI.  Orders placed. -If not improving refer to wound care center. -Monitor for any clinical signs or symptoms of infection and directed to call the office immediately should any occur or go to the ER.  No follow-ups on file.  Trula Slade DPM

## 2022-07-03 DIAGNOSIS — L89610 Pressure ulcer of right heel, unstageable: Secondary | ICD-10-CM | POA: Diagnosis not present

## 2022-07-03 DIAGNOSIS — L89609 Pressure ulcer of unspecified heel, unspecified stage: Secondary | ICD-10-CM | POA: Diagnosis not present

## 2022-07-03 DIAGNOSIS — E1151 Type 2 diabetes mellitus with diabetic peripheral angiopathy without gangrene: Secondary | ICD-10-CM | POA: Diagnosis not present

## 2022-07-03 DIAGNOSIS — I739 Peripheral vascular disease, unspecified: Secondary | ICD-10-CM | POA: Diagnosis not present

## 2022-07-03 DIAGNOSIS — I130 Hypertensive heart and chronic kidney disease with heart failure and stage 1 through stage 4 chronic kidney disease, or unspecified chronic kidney disease: Secondary | ICD-10-CM | POA: Diagnosis not present

## 2022-07-03 DIAGNOSIS — R609 Edema, unspecified: Secondary | ICD-10-CM | POA: Diagnosis not present

## 2022-07-03 DIAGNOSIS — I5030 Unspecified diastolic (congestive) heart failure: Secondary | ICD-10-CM | POA: Diagnosis not present

## 2022-07-03 DIAGNOSIS — E1140 Type 2 diabetes mellitus with diabetic neuropathy, unspecified: Secondary | ICD-10-CM | POA: Diagnosis not present

## 2022-07-03 DIAGNOSIS — E1122 Type 2 diabetes mellitus with diabetic chronic kidney disease: Secondary | ICD-10-CM | POA: Diagnosis not present

## 2022-07-06 ENCOUNTER — Ambulatory Visit (INDEPENDENT_AMBULATORY_CARE_PROVIDER_SITE_OTHER): Payer: Medicare Other | Admitting: Neurology

## 2022-07-06 ENCOUNTER — Encounter: Payer: Self-pay | Admitting: Neurology

## 2022-07-06 DIAGNOSIS — L89610 Pressure ulcer of right heel, unstageable: Secondary | ICD-10-CM | POA: Diagnosis not present

## 2022-07-06 DIAGNOSIS — E1122 Type 2 diabetes mellitus with diabetic chronic kidney disease: Secondary | ICD-10-CM | POA: Diagnosis not present

## 2022-07-06 DIAGNOSIS — F01B3 Vascular dementia, moderate, with mood disturbance: Secondary | ICD-10-CM | POA: Diagnosis not present

## 2022-07-06 DIAGNOSIS — E1140 Type 2 diabetes mellitus with diabetic neuropathy, unspecified: Secondary | ICD-10-CM | POA: Diagnosis not present

## 2022-07-06 DIAGNOSIS — E1151 Type 2 diabetes mellitus with diabetic peripheral angiopathy without gangrene: Secondary | ICD-10-CM | POA: Diagnosis not present

## 2022-07-06 DIAGNOSIS — I5030 Unspecified diastolic (congestive) heart failure: Secondary | ICD-10-CM | POA: Diagnosis not present

## 2022-07-06 DIAGNOSIS — I130 Hypertensive heart and chronic kidney disease with heart failure and stage 1 through stage 4 chronic kidney disease, or unspecified chronic kidney disease: Secondary | ICD-10-CM | POA: Diagnosis not present

## 2022-07-06 MED ORDER — MEMANTINE HCL 10 MG PO TABS: 10.0000 mg | ORAL_TABLET | Freq: Two times a day (BID) | ORAL | 3 refills | Status: DC

## 2022-07-06 MED ORDER — BUSPIRONE HCL 15 MG PO TABS: 15.0000 mg | ORAL_TABLET | Freq: Two times a day (BID) | ORAL | 3 refills | Status: DC

## 2022-07-06 MED ORDER — QUETIAPINE FUMARATE 50 MG PO TABS: 50.0000 mg | ORAL_TABLET | Freq: Every day | ORAL | 3 refills | Status: DC

## 2022-07-06 MED ORDER — DONEPEZIL HCL 10 MG PO TABS: ORAL_TABLET | ORAL | 3 refills | Status: DC

## 2022-07-06 NOTE — Patient Instructions (Signed)
Always good to see you. Continue all your medications. Follow-up in 6 months, call for any changes.
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
## 2022-07-08 DIAGNOSIS — M19012 Primary osteoarthritis, left shoulder: Secondary | ICD-10-CM

## 2022-07-08 DIAGNOSIS — L89610 Pressure ulcer of right heel, unstageable: Secondary | ICD-10-CM | POA: Diagnosis not present

## 2022-07-08 DIAGNOSIS — E785 Hyperlipidemia, unspecified: Secondary | ICD-10-CM

## 2022-07-08 DIAGNOSIS — F01B2 Vascular dementia, moderate, with psychotic disturbance: Secondary | ICD-10-CM

## 2022-07-08 DIAGNOSIS — I5030 Unspecified diastolic (congestive) heart failure: Secondary | ICD-10-CM

## 2022-07-08 DIAGNOSIS — M48061 Spinal stenosis, lumbar region without neurogenic claudication: Secondary | ICD-10-CM

## 2022-07-08 DIAGNOSIS — K219 Gastro-esophageal reflux disease without esophagitis: Secondary | ICD-10-CM

## 2022-07-08 DIAGNOSIS — M21619 Bunion of unspecified foot: Secondary | ICD-10-CM

## 2022-07-08 DIAGNOSIS — Z87891 Personal history of nicotine dependence: Secondary | ICD-10-CM

## 2022-07-08 DIAGNOSIS — E1122 Type 2 diabetes mellitus with diabetic chronic kidney disease: Secondary | ICD-10-CM

## 2022-07-08 DIAGNOSIS — Z951 Presence of aortocoronary bypass graft: Secondary | ICD-10-CM

## 2022-07-08 DIAGNOSIS — M799 Soft tissue disorder, unspecified: Secondary | ICD-10-CM

## 2022-07-08 DIAGNOSIS — F01B3 Vascular dementia, moderate, with mood disturbance: Secondary | ICD-10-CM

## 2022-07-08 DIAGNOSIS — M5430 Sciatica, unspecified side: Secondary | ICD-10-CM

## 2022-07-08 DIAGNOSIS — M4726 Other spondylosis with radiculopathy, lumbar region: Secondary | ICD-10-CM

## 2022-07-08 DIAGNOSIS — E1151 Type 2 diabetes mellitus with diabetic peripheral angiopathy without gangrene: Secondary | ICD-10-CM | POA: Diagnosis not present

## 2022-07-08 DIAGNOSIS — N1831 Chronic kidney disease, stage 3a: Secondary | ICD-10-CM

## 2022-07-08 DIAGNOSIS — Z7982 Long term (current) use of aspirin: Secondary | ICD-10-CM

## 2022-07-08 DIAGNOSIS — G8929 Other chronic pain: Secondary | ICD-10-CM

## 2022-07-08 DIAGNOSIS — M109 Gout, unspecified: Secondary | ICD-10-CM

## 2022-07-08 DIAGNOSIS — M769 Unspecified enthesopathy, lower limb, excluding foot: Secondary | ICD-10-CM

## 2022-07-08 DIAGNOSIS — I2510 Atherosclerotic heart disease of native coronary artery without angina pectoris: Secondary | ICD-10-CM

## 2022-07-08 DIAGNOSIS — L89609 Pressure ulcer of unspecified heel, unspecified stage: Secondary | ICD-10-CM | POA: Diagnosis not present

## 2022-07-08 DIAGNOSIS — Z791 Long term (current) use of non-steroidal anti-inflammatories (NSAID): Secondary | ICD-10-CM

## 2022-07-08 DIAGNOSIS — Z96653 Presence of artificial knee joint, bilateral: Secondary | ICD-10-CM

## 2022-07-08 DIAGNOSIS — G4700 Insomnia, unspecified: Secondary | ICD-10-CM

## 2022-07-08 DIAGNOSIS — E1140 Type 2 diabetes mellitus with diabetic neuropathy, unspecified: Secondary | ICD-10-CM | POA: Diagnosis not present

## 2022-07-08 DIAGNOSIS — M47812 Spondylosis without myelopathy or radiculopathy, cervical region: Secondary | ICD-10-CM

## 2022-07-08 DIAGNOSIS — L719 Rosacea, unspecified: Secondary | ICD-10-CM

## 2022-07-08 DIAGNOSIS — Z87440 Personal history of urinary (tract) infections: Secondary | ICD-10-CM

## 2022-07-08 DIAGNOSIS — F32A Depression, unspecified: Secondary | ICD-10-CM

## 2022-07-08 DIAGNOSIS — D631 Anemia in chronic kidney disease: Secondary | ICD-10-CM

## 2022-07-08 DIAGNOSIS — I130 Hypertensive heart and chronic kidney disease with heart failure and stage 1 through stage 4 chronic kidney disease, or unspecified chronic kidney disease: Secondary | ICD-10-CM | POA: Diagnosis not present

## 2022-07-08 DIAGNOSIS — F0392 Unspecified dementia, unspecified severity, with psychotic disturbance: Secondary | ICD-10-CM | POA: Diagnosis not present

## 2022-07-09 ENCOUNTER — Ambulatory Visit (HOSPITAL_COMMUNITY)
Admission: RE | Admit: 2022-07-09 | Discharge: 2022-07-09 | Disposition: A | Discharge status: Home / Self Care | Payer: Medicare Other | Source: Ambulatory Visit | Attending: Vascular Surgery | Admitting: Vascular Surgery

## 2022-07-09 DIAGNOSIS — L97412 Non-pressure chronic ulcer of right heel and midfoot with fat layer exposed: Secondary | ICD-10-CM | POA: Diagnosis not present

## 2022-07-09 DIAGNOSIS — I739 Peripheral vascular disease, unspecified: Secondary | ICD-10-CM | POA: Insufficient documentation

## 2022-07-09 LAB — VAS US ABI WITH/WO TBI

## 2022-07-10 DIAGNOSIS — L089 Local infection of the skin and subcutaneous tissue, unspecified: Secondary | ICD-10-CM | POA: Diagnosis not present

## 2022-07-10 DIAGNOSIS — E1122 Type 2 diabetes mellitus with diabetic chronic kidney disease: Secondary | ICD-10-CM | POA: Diagnosis not present

## 2022-07-10 DIAGNOSIS — I130 Hypertensive heart and chronic kidney disease with heart failure and stage 1 through stage 4 chronic kidney disease, or unspecified chronic kidney disease: Secondary | ICD-10-CM | POA: Diagnosis not present

## 2022-07-10 DIAGNOSIS — I5030 Unspecified diastolic (congestive) heart failure: Secondary | ICD-10-CM | POA: Diagnosis not present

## 2022-07-10 DIAGNOSIS — E1151 Type 2 diabetes mellitus with diabetic peripheral angiopathy without gangrene: Secondary | ICD-10-CM | POA: Diagnosis not present

## 2022-07-10 DIAGNOSIS — E1140 Type 2 diabetes mellitus with diabetic neuropathy, unspecified: Secondary | ICD-10-CM | POA: Diagnosis not present

## 2022-07-10 DIAGNOSIS — L89609 Pressure ulcer of unspecified heel, unspecified stage: Secondary | ICD-10-CM | POA: Diagnosis not present

## 2022-07-10 DIAGNOSIS — L89610 Pressure ulcer of right heel, unstageable: Secondary | ICD-10-CM | POA: Diagnosis not present

## 2022-07-13 ENCOUNTER — Other Ambulatory Visit: Payer: Self-pay | Admitting: Podiatry

## 2022-07-13 DIAGNOSIS — I739 Peripheral vascular disease, unspecified: Secondary | ICD-10-CM

## 2022-07-13 DIAGNOSIS — L97412 Non-pressure chronic ulcer of right heel and midfoot with fat layer exposed: Secondary | ICD-10-CM

## 2022-07-14 DIAGNOSIS — I5030 Unspecified diastolic (congestive) heart failure: Secondary | ICD-10-CM | POA: Diagnosis not present

## 2022-07-14 DIAGNOSIS — E1122 Type 2 diabetes mellitus with diabetic chronic kidney disease: Secondary | ICD-10-CM | POA: Diagnosis not present

## 2022-07-14 DIAGNOSIS — L89610 Pressure ulcer of right heel, unstageable: Secondary | ICD-10-CM | POA: Diagnosis not present

## 2022-07-14 DIAGNOSIS — E1151 Type 2 diabetes mellitus with diabetic peripheral angiopathy without gangrene: Secondary | ICD-10-CM | POA: Diagnosis not present

## 2022-07-14 DIAGNOSIS — E1140 Type 2 diabetes mellitus with diabetic neuropathy, unspecified: Secondary | ICD-10-CM | POA: Diagnosis not present

## 2022-07-14 DIAGNOSIS — I130 Hypertensive heart and chronic kidney disease with heart failure and stage 1 through stage 4 chronic kidney disease, or unspecified chronic kidney disease: Secondary | ICD-10-CM | POA: Diagnosis not present

## 2022-07-15 DIAGNOSIS — I130 Hypertensive heart and chronic kidney disease with heart failure and stage 1 through stage 4 chronic kidney disease, or unspecified chronic kidney disease: Secondary | ICD-10-CM | POA: Diagnosis not present

## 2022-07-15 DIAGNOSIS — E1140 Type 2 diabetes mellitus with diabetic neuropathy, unspecified: Secondary | ICD-10-CM | POA: Diagnosis not present

## 2022-07-15 DIAGNOSIS — I5030 Unspecified diastolic (congestive) heart failure: Secondary | ICD-10-CM | POA: Diagnosis not present

## 2022-07-15 DIAGNOSIS — E1122 Type 2 diabetes mellitus with diabetic chronic kidney disease: Secondary | ICD-10-CM | POA: Diagnosis not present

## 2022-07-15 DIAGNOSIS — L89610 Pressure ulcer of right heel, unstageable: Secondary | ICD-10-CM | POA: Diagnosis not present

## 2022-07-15 DIAGNOSIS — E1151 Type 2 diabetes mellitus with diabetic peripheral angiopathy without gangrene: Secondary | ICD-10-CM | POA: Diagnosis not present

## 2022-07-16 ENCOUNTER — Ambulatory Visit: Payer: Medicare Other | Attending: Internal Medicine | Admitting: Internal Medicine

## 2022-07-16 ENCOUNTER — Encounter: Payer: Self-pay | Admitting: Internal Medicine

## 2022-07-16 ENCOUNTER — Telehealth: Payer: Self-pay | Admitting: Internal Medicine

## 2022-07-16 VITALS — BP 150/62 | HR 70 | Ht 59.0 in | Wt 129.0 lb

## 2022-07-16 DIAGNOSIS — I2510 Atherosclerotic heart disease of native coronary artery without angina pectoris: Secondary | ICD-10-CM | POA: Insufficient documentation

## 2022-07-16 DIAGNOSIS — R4182 Altered mental status, unspecified: Secondary | ICD-10-CM | POA: Diagnosis not present

## 2022-07-16 DIAGNOSIS — L89609 Pressure ulcer of unspecified heel, unspecified stage: Secondary | ICD-10-CM | POA: Diagnosis not present

## 2022-07-16 DIAGNOSIS — F03A0 Unspecified dementia, mild, without behavioral disturbance, psychotic disturbance, mood disturbance, and anxiety: Secondary | ICD-10-CM | POA: Diagnosis not present

## 2022-07-16 NOTE — Telephone Encounter (Signed)
Pt daughter called in upset because she states her appt was at 11:20 for a mychart visit. She states that is incorrect and her appt is supposed to be for 3:00pm. Informed her Dr. Harrington Challenger has another pt during that time. Reached out to RN Audrea Muscat and confirmed that 11:20 is the correct time. Pt daughter states she would like to speak to manager and she will be coming up to the office to "show someone" the AVS paperwork she received at last appt.

## 2022-07-16 NOTE — Telephone Encounter (Signed)
Investigated this some more. Taken to Dr. Harrington Challenger to readdress as pt/family were told 3pm today for virtual visit and showed Dr. Harrington Challenger the [place] message from RN advising as such. Dr. Harrington Challenger agreeable to see pt this afternoon at 3 for virtual visit. Dtr, Dr. [name] Heck, aware that MD will see pt this afternoon for virtual visit. She appreciates the help on this.

## 2022-07-16 NOTE — Progress Notes (Signed)
Virtual Visit via Video Note   Because of [name]'s co-morbid illnesses, she is at least at moderate risk for complications without adequate follow up.  This format is felt to be most appropriate for this patient at this time.  All issues noted in this document were discussed and addressed.  A limited physical exam was performed with this format.  Please refer to the patient's chart for her consent to telehealth for [facility].       Date:  07/16/2022   ID:  [name], DOB Nov 18, 1930, MRN [id number] The patient was identified using 2 identifiers.  Patient Location: [place] Provider Location: Office/Clinic     Evaluation Performed:  Follow-Up Visit  Chief Complaint:  Pt seen for follow up of CAD   History of Present Illness:    [name] is a 89 y.o. female with hx of  CAD (CABG in 2010) , HTN, HL T2DM, dementia fibromyalgia   Myoview in 2018 was negatvie for ischemia      I saw the pt in clinic in May 2023     Pt hospitalized Oct 2023 for altered mentation. Treated for UTI  Hydrated   Meds adjusted  She is now in memory facility    Daughter present during video visit  The pt appears comfortable    She denies CP  Breathing is OK  No dizziness  has some LE extremity edema Sleeping good   Appetite is good   Past Medical History:  Diagnosis Date   Acute cystitis without hematuria 123XX123   Acute metabolic encephalopathy 123XX123   Acute respiratory failure with hypoxia ([place]) 01/30/2022   AKI (acute kidney injury) ([place]) 01/31/2022   CAD, NATIVE VESSEL 06/09/2008   Dehydration 01/30/2022   Delirium with dementia 01/08/2022   DIABETES MELLITUS, TYPE II    Encounter for testing for latent tuberculosis 01/13/2022   FIBROMYALGIA    GERD    Gout    HIATAL HERNIA    HYPERLIPIDEMIA    HYPERTENSION    Hypokalemia 01/31/2022   Insomnia    OA (osteoarthritis)    severe, shoulders, hands - inflammatory, ?RA    Pressure ulcer of heel, right, unstageable ([place]) 06/12/2022   S/P CABG x 1    S/P inguinal hernia repair    TUBULOVILLOUS ADENOMA, 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   colo 04/2012 - no polyps - no further screening planned (age >68)   UNSPECIFIED ANEMIA    Wrist pain, acute, right 03/18/2022   Past Surgical History:  Procedure Laterality Date   ABDOMINAL HYSTERECTOMY  1981   APPENDECTOMY     CATARACT EXTRACTION, BILATERAL     CORONARY ARTERY BYPASS GRAFT  2010   HERNIA REPAIR     INGUINAL   I & D EXTREMITY Left 12/07/2017   Procedure: IRRIGATION AND DEBRIDEMENT EXTREMITY WITH FOREIGN BODY REMOVAL;  Surgeon: Nicholes Stairs, MD;  Location: [place];  Service: Orthopedics;  Laterality: Left;   JOINT REPLACEMENT     OVARIAN CYST REMOVAL     REPLACEMENT TOTAL KNEE BILATERAL Bilateral    Rt-1974, Lt -1989   REVERSE SHOULDER ARTHROPLASTY  09/08/2011   Procedure: REVERSE SHOULDER ARTHROPLASTY;  Surgeon: Nita Sells, MD;  Location: [place];  Service: Orthopedics;  Laterality: Left;  left reverse total shoulder arthroplasty   ROTATOR CUFF REPAIR     Left     Current Meds  Medication Sig   acetaminophen (TYLENOL) 500 MG tablet Take 1,000 mg by mouth  2 (two) times daily.    amLODipine (NORVASC) 5 MG tablet Take 1 tablet (5 mg total) by mouth 2 (two) times daily.   amoxicillin-clavulanate (AUGMENTIN) 875-125 MG tablet Take 1 tablet by mouth 2 (two) times daily.   aspirin EC 81 MG tablet Take 81 mg by mouth daily.   busPIRone (BUSPAR) 15 MG tablet Take 1 tablet (15 mg total) by mouth 2 (two) times daily.   carvedilol (COREG) 6.25 MG tablet TAKE ONE TABLET BY MOUTH TWICE A DAY   cetirizine (ZYRTEC) 10 MG tablet TAKE ONE TABLET BY MOUTH DAILY   Cholecalciferol (D3-1000) 25 MCG (1000 UT) capsule Take 1,000 Units by mouth daily.   donepezil (ARICEPT) 10 MG tablet TAKE ONE TABLET BY MOUTH DAILY   ferrous sulfate 324 MG TBEC Take 324 mg by mouth daily.   gabapentin (NEURONTIN) 300 MG capsule Take 1  capsule (300 mg total) by mouth at bedtime.   MELATONIN PO Take 6 mg by mouth at bedtime.   meloxicam (MOBIC) 7.5 MG tablet Take 7.5 mg by mouth daily.   memantine (NAMENDA) 10 MG tablet Take 1 tablet (10 mg total) by mouth 2 (two) times daily.   Menthol, Topical Analgesic, (BIOFREEZE) 4 % GEL Apply 1 Application topically in the morning, at noon, and at bedtime. To mid back and right wrist   omeprazole (PRILOSEC) 40 MG capsule Take 1 capsule (40 mg total) by mouth daily.   QUEtiapine (SEROQUEL) 50 MG tablet Take 1 tablet (50 mg total) by mouth at bedtime.   risperiDONE (RISPERDAL) 0.25 MG tablet Take 0.25 mg by mouth daily.   SANTYL 250 UNIT/GM ointment Apply 1 Application topically daily.   traMADol (ULTRAM) 50 MG tablet Take 50 mg by mouth 3 (three) times daily as needed for moderate pain.   zinc oxide 20 % ointment Apply 1 Application topically 2 (two) times daily.     Allergies:   Depakote [divalproex sodium]   Social History   Tobacco Use   Smoking status: Former    Types: Cigarettes    Quit date: 04/26/1970    Years since quitting: 52.2   Smokeless tobacco: Never  Vaping Use   Vaping Use: Never used  Substance Use Topics   Alcohol use: Not Currently    Alcohol/week: 1.0 standard drink of alcohol    Types: 1 Glasses of wine per week   Drug use: No     Family Hx: The patient's family history includes Cancer in her mother; Dementia in her sister; Diabetes in her daughter, father, and sister; Heart attack in her brother and father; Heart disease in her sister; Heart disease (age of onset: 50) in her father; Hypertension in her daughter and mother; Parkinsonism in her sister; Stomach cancer (age of onset: 82) in her mother; Stroke in her daughter, sister, and sister; Vascular Disease in her sister. There is no history of Colon cancer.  ROS:   Please see the history of present illness.     All other systems reviewed and are negative.      Labs/Other Tests and Data Reviewed:     EKG:  No ECG reviewed.  Recent Labs: 01/30/2022: B Natriuretic Peptide 150.6 01/31/2022: ALT 13; TSH 1.306 02/03/2022: Magnesium 2.2 06/20/2022: BUN 17; Creatinine, Ser 1.03; Hemoglobin 11.4; Platelets 173; Potassium 3.5; Sodium 137   Recent Lipid Panel Lab Results  Component Value Date/Time   CHOL 217 (H) 09/05/2021 01:52 PM   TRIG 114.0 09/05/2021 01:52 PM   HDL 97.60 09/05/2021 01:52 PM  CHOLHDL 2 09/05/2021 01:52 PM   LDLCALC 97 09/05/2021 01:52 PM   LDLCALC 72 12/28/2019 10:42 AM   LDLDIRECT 115.3 09/30/2009 10:59 AM    Wt Readings from Last 3 Encounters:  07/16/22 129 lb (58.5 kg)  07/06/22 129 lb (58.5 kg)  01/30/22 133 lb (60.3 kg)     Risk Assessment/Calculations:          Objective:    Vital Signs:  BP (!) 150/62   Pulse 70   Ht 4\' 11"  (1.499 m)   Wt 129 lb (58.5 kg)   BMI 26.05 kg/m    Pt appears comfortable   ASSESSMENT & PLAN:    CAD   Pt with remote CABG   No symptoms of angina   Follow   2  HTN  BP is increased    I would not make any changes   There is a NP that sees patient regularly at facility   Can make adjustments there   3  LE edema   watch diet    Again, NP can Rx lasix prn for comfort             Time:   Today, I have spent 20 minutes with the patient with telehealth technology discussing the above problems.     Medication Adjustments/Labs and Tests Ordered: Current medicines are reviewed at length with the patient today.  Concerns regarding medicines are outlined above.     Follow Up:  Virtual Visit   next winter; sooner if needed   Signed, Dorris Carnes, MD  07/16/2022 3:25 PM    [place]

## 2022-07-16 NOTE — Patient Instructions (Signed)
Medication Instructions:  Your physician recommends that you continue on your current medications as directed. Please refer to the Current Medication list given to you today.  *If you need a refill on your cardiac medications before your next appointment, please call your pharmacy*   Follow-Up: At [facility], you and your health needs are our priority.  As part of our continuing mission to provide you with exceptional heart care, we have created designated Provider Care Teams.  These Care Teams include your primary Cardiologist (physician) and Advanced Practice Providers (APPs -  Physician Assistants and Nurse Practitioners) who all work together to provide you with the care you need, when you need it.   Your next appointment:   9 month(s)  Provider:   Dorris Carnes, MD

## 2022-07-17 DIAGNOSIS — E1151 Type 2 diabetes mellitus with diabetic peripheral angiopathy without gangrene: Secondary | ICD-10-CM | POA: Diagnosis not present

## 2022-07-17 DIAGNOSIS — E1122 Type 2 diabetes mellitus with diabetic chronic kidney disease: Secondary | ICD-10-CM | POA: Diagnosis not present

## 2022-07-17 DIAGNOSIS — E1140 Type 2 diabetes mellitus with diabetic neuropathy, unspecified: Secondary | ICD-10-CM | POA: Diagnosis not present

## 2022-07-17 DIAGNOSIS — I5030 Unspecified diastolic (congestive) heart failure: Secondary | ICD-10-CM | POA: Diagnosis not present

## 2022-07-17 DIAGNOSIS — I130 Hypertensive heart and chronic kidney disease with heart failure and stage 1 through stage 4 chronic kidney disease, or unspecified chronic kidney disease: Secondary | ICD-10-CM | POA: Diagnosis not present

## 2022-07-17 DIAGNOSIS — L89610 Pressure ulcer of right heel, unstageable: Secondary | ICD-10-CM | POA: Diagnosis not present

## 2022-07-20 DIAGNOSIS — E1140 Type 2 diabetes mellitus with diabetic neuropathy, unspecified: Secondary | ICD-10-CM | POA: Diagnosis not present

## 2022-07-20 DIAGNOSIS — E1151 Type 2 diabetes mellitus with diabetic peripheral angiopathy without gangrene: Secondary | ICD-10-CM | POA: Diagnosis not present

## 2022-07-20 DIAGNOSIS — I5030 Unspecified diastolic (congestive) heart failure: Secondary | ICD-10-CM | POA: Diagnosis not present

## 2022-07-20 DIAGNOSIS — E1122 Type 2 diabetes mellitus with diabetic chronic kidney disease: Secondary | ICD-10-CM | POA: Diagnosis not present

## 2022-07-20 DIAGNOSIS — L89610 Pressure ulcer of right heel, unstageable: Secondary | ICD-10-CM | POA: Diagnosis not present

## 2022-07-20 DIAGNOSIS — I130 Hypertensive heart and chronic kidney disease with heart failure and stage 1 through stage 4 chronic kidney disease, or unspecified chronic kidney disease: Secondary | ICD-10-CM | POA: Diagnosis not present

## 2022-07-22 DIAGNOSIS — I5030 Unspecified diastolic (congestive) heart failure: Secondary | ICD-10-CM | POA: Diagnosis not present

## 2022-07-22 DIAGNOSIS — L89610 Pressure ulcer of right heel, unstageable: Secondary | ICD-10-CM | POA: Diagnosis not present

## 2022-07-22 DIAGNOSIS — E1151 Type 2 diabetes mellitus with diabetic peripheral angiopathy without gangrene: Secondary | ICD-10-CM | POA: Diagnosis not present

## 2022-07-22 DIAGNOSIS — I130 Hypertensive heart and chronic kidney disease with heart failure and stage 1 through stage 4 chronic kidney disease, or unspecified chronic kidney disease: Secondary | ICD-10-CM | POA: Diagnosis not present

## 2022-07-22 DIAGNOSIS — E1140 Type 2 diabetes mellitus with diabetic neuropathy, unspecified: Secondary | ICD-10-CM | POA: Diagnosis not present

## 2022-07-22 DIAGNOSIS — E1122 Type 2 diabetes mellitus with diabetic chronic kidney disease: Secondary | ICD-10-CM | POA: Diagnosis not present

## 2022-07-23 ENCOUNTER — Ambulatory Visit (INDEPENDENT_AMBULATORY_CARE_PROVIDER_SITE_OTHER): Payer: Medicare Other | Admitting: Podiatry

## 2022-07-23 DIAGNOSIS — E559 Vitamin D deficiency, unspecified: Secondary | ICD-10-CM | POA: Diagnosis not present

## 2022-07-23 DIAGNOSIS — F0390 Unspecified dementia without behavioral disturbance: Secondary | ICD-10-CM | POA: Diagnosis not present

## 2022-07-23 DIAGNOSIS — L97412 Non-pressure chronic ulcer of right heel and midfoot with fat layer exposed: Secondary | ICD-10-CM | POA: Diagnosis not present

## 2022-07-23 DIAGNOSIS — I739 Peripheral vascular disease, unspecified: Secondary | ICD-10-CM

## 2022-07-23 DIAGNOSIS — I10 Essential (primary) hypertension: Secondary | ICD-10-CM | POA: Diagnosis not present

## 2022-07-23 DIAGNOSIS — R7309 Other abnormal glucose: Secondary | ICD-10-CM | POA: Diagnosis not present

## 2022-07-24 DIAGNOSIS — I5030 Unspecified diastolic (congestive) heart failure: Secondary | ICD-10-CM | POA: Diagnosis not present

## 2022-07-24 DIAGNOSIS — E1151 Type 2 diabetes mellitus with diabetic peripheral angiopathy without gangrene: Secondary | ICD-10-CM | POA: Diagnosis not present

## 2022-07-24 DIAGNOSIS — E1122 Type 2 diabetes mellitus with diabetic chronic kidney disease: Secondary | ICD-10-CM | POA: Diagnosis not present

## 2022-07-24 DIAGNOSIS — I130 Hypertensive heart and chronic kidney disease with heart failure and stage 1 through stage 4 chronic kidney disease, or unspecified chronic kidney disease: Secondary | ICD-10-CM | POA: Diagnosis not present

## 2022-07-24 DIAGNOSIS — E1140 Type 2 diabetes mellitus with diabetic neuropathy, unspecified: Secondary | ICD-10-CM | POA: Diagnosis not present

## 2022-07-24 DIAGNOSIS — L89610 Pressure ulcer of right heel, unstageable: Secondary | ICD-10-CM | POA: Diagnosis not present

## 2022-07-26 NOTE — Progress Notes (Signed)
Subjective: Chief Complaint  Patient presents with   Foot Ulcer    Right heel ulcer    89 year old female presents For above concerns with her daughter.  They have been using Santyl on the wound.  He was getting somewhat better but still remains.  She does have an appointment to see vascular surgery in a couple weeks.  She does not report any fevers or chills.  No significant pain.  Objective: AAO x3, NAD Pulses decreased but the foot is warm and perfused. Ulceration noted to the right lateral heel.  There is increased purulence and tissue present on the periphery but fibrotic tissue centrally.  Measures about the same in diameter 3 x 1.8 cm without any probing to bone today.  There is no sign of edema.  There is mild edema.  There is no fluctuance or crepitation.  There is no malodor. No pain with calf compression, swelling, warmth, erythema    Assessment: 89 year old female with heel ulceration right foot  Plan: -All treatment options discussed with the patient including all alternatives, risks, complications.  -I did debride some of the fibrotic tissue present on the central aspect without any complications.  Continue with Santyl dressing changes.  Continue offloading at all times.  Follow-up with vascular surgery as scheduled.  Need to monitor very closely for any signs or symptoms of infection and report emergency room should any occur.  If no improvement may consider surgical debridement, grafting and wound VAC. -Patient encouraged to call the office with any questions, concerns, change in symptoms.   Vivi Barrack DPM

## 2022-07-27 DIAGNOSIS — E1122 Type 2 diabetes mellitus with diabetic chronic kidney disease: Secondary | ICD-10-CM | POA: Diagnosis not present

## 2022-07-27 DIAGNOSIS — I130 Hypertensive heart and chronic kidney disease with heart failure and stage 1 through stage 4 chronic kidney disease, or unspecified chronic kidney disease: Secondary | ICD-10-CM | POA: Diagnosis not present

## 2022-07-27 DIAGNOSIS — E1151 Type 2 diabetes mellitus with diabetic peripheral angiopathy without gangrene: Secondary | ICD-10-CM | POA: Diagnosis not present

## 2022-07-27 DIAGNOSIS — E1140 Type 2 diabetes mellitus with diabetic neuropathy, unspecified: Secondary | ICD-10-CM | POA: Diagnosis not present

## 2022-07-27 DIAGNOSIS — L89610 Pressure ulcer of right heel, unstageable: Secondary | ICD-10-CM | POA: Diagnosis not present

## 2022-07-27 DIAGNOSIS — I5030 Unspecified diastolic (congestive) heart failure: Secondary | ICD-10-CM | POA: Diagnosis not present

## 2022-07-29 DIAGNOSIS — I739 Peripheral vascular disease, unspecified: Secondary | ICD-10-CM | POA: Diagnosis not present

## 2022-07-29 DIAGNOSIS — I130 Hypertensive heart and chronic kidney disease with heart failure and stage 1 through stage 4 chronic kidney disease, or unspecified chronic kidney disease: Secondary | ICD-10-CM | POA: Diagnosis not present

## 2022-07-29 DIAGNOSIS — F0390 Unspecified dementia without behavioral disturbance: Secondary | ICD-10-CM | POA: Diagnosis not present

## 2022-07-29 DIAGNOSIS — E1122 Type 2 diabetes mellitus with diabetic chronic kidney disease: Secondary | ICD-10-CM | POA: Diagnosis not present

## 2022-07-29 DIAGNOSIS — L89609 Pressure ulcer of unspecified heel, unspecified stage: Secondary | ICD-10-CM | POA: Diagnosis not present

## 2022-07-29 DIAGNOSIS — E559 Vitamin D deficiency, unspecified: Secondary | ICD-10-CM | POA: Diagnosis not present

## 2022-07-29 DIAGNOSIS — E1151 Type 2 diabetes mellitus with diabetic peripheral angiopathy without gangrene: Secondary | ICD-10-CM | POA: Diagnosis not present

## 2022-07-29 DIAGNOSIS — I10 Essential (primary) hypertension: Secondary | ICD-10-CM | POA: Diagnosis not present

## 2022-07-29 DIAGNOSIS — R4182 Altered mental status, unspecified: Secondary | ICD-10-CM | POA: Diagnosis not present

## 2022-07-29 DIAGNOSIS — I5030 Unspecified diastolic (congestive) heart failure: Secondary | ICD-10-CM | POA: Diagnosis not present

## 2022-07-29 DIAGNOSIS — L89610 Pressure ulcer of right heel, unstageable: Secondary | ICD-10-CM | POA: Diagnosis not present

## 2022-07-29 DIAGNOSIS — E1140 Type 2 diabetes mellitus with diabetic neuropathy, unspecified: Secondary | ICD-10-CM | POA: Diagnosis not present

## 2022-07-30 DIAGNOSIS — M797 Fibromyalgia: Secondary | ICD-10-CM | POA: Diagnosis not present

## 2022-07-30 DIAGNOSIS — M48061 Spinal stenosis, lumbar region without neurogenic claudication: Secondary | ICD-10-CM | POA: Diagnosis not present

## 2022-07-30 DIAGNOSIS — E785 Hyperlipidemia, unspecified: Secondary | ICD-10-CM | POA: Diagnosis not present

## 2022-07-30 DIAGNOSIS — D631 Anemia in chronic kidney disease: Secondary | ICD-10-CM | POA: Diagnosis not present

## 2022-07-30 DIAGNOSIS — M5430 Sciatica, unspecified side: Secondary | ICD-10-CM | POA: Diagnosis not present

## 2022-07-30 DIAGNOSIS — M21619 Bunion of unspecified foot: Secondary | ICD-10-CM | POA: Diagnosis not present

## 2022-07-30 DIAGNOSIS — Z87440 Personal history of urinary (tract) infections: Secondary | ICD-10-CM | POA: Diagnosis not present

## 2022-07-30 DIAGNOSIS — M19012 Primary osteoarthritis, left shoulder: Secondary | ICD-10-CM | POA: Diagnosis not present

## 2022-07-30 DIAGNOSIS — M109 Gout, unspecified: Secondary | ICD-10-CM | POA: Diagnosis not present

## 2022-07-30 DIAGNOSIS — M769 Unspecified enthesopathy, lower limb, excluding foot: Secondary | ICD-10-CM | POA: Diagnosis not present

## 2022-07-30 DIAGNOSIS — E1140 Type 2 diabetes mellitus with diabetic neuropathy, unspecified: Secondary | ICD-10-CM | POA: Diagnosis not present

## 2022-07-30 DIAGNOSIS — F01B2 Vascular dementia, moderate, with psychotic disturbance: Secondary | ICD-10-CM | POA: Diagnosis not present

## 2022-07-30 DIAGNOSIS — K219 Gastro-esophageal reflux disease without esophagitis: Secondary | ICD-10-CM | POA: Diagnosis not present

## 2022-07-30 DIAGNOSIS — I2510 Atherosclerotic heart disease of native coronary artery without angina pectoris: Secondary | ICD-10-CM | POA: Diagnosis not present

## 2022-07-30 DIAGNOSIS — E1151 Type 2 diabetes mellitus with diabetic peripheral angiopathy without gangrene: Secondary | ICD-10-CM | POA: Diagnosis not present

## 2022-07-30 DIAGNOSIS — F32A Depression, unspecified: Secondary | ICD-10-CM | POA: Diagnosis not present

## 2022-07-30 DIAGNOSIS — M4726 Other spondylosis with radiculopathy, lumbar region: Secondary | ICD-10-CM | POA: Diagnosis not present

## 2022-07-30 DIAGNOSIS — G4700 Insomnia, unspecified: Secondary | ICD-10-CM | POA: Diagnosis not present

## 2022-07-30 DIAGNOSIS — I130 Hypertensive heart and chronic kidney disease with heart failure and stage 1 through stage 4 chronic kidney disease, or unspecified chronic kidney disease: Secondary | ICD-10-CM | POA: Diagnosis not present

## 2022-07-30 DIAGNOSIS — E1122 Type 2 diabetes mellitus with diabetic chronic kidney disease: Secondary | ICD-10-CM | POA: Diagnosis not present

## 2022-07-30 DIAGNOSIS — I5030 Unspecified diastolic (congestive) heart failure: Secondary | ICD-10-CM | POA: Diagnosis not present

## 2022-07-30 DIAGNOSIS — N1831 Chronic kidney disease, stage 3a: Secondary | ICD-10-CM | POA: Diagnosis not present

## 2022-07-30 DIAGNOSIS — Z951 Presence of aortocoronary bypass graft: Secondary | ICD-10-CM | POA: Diagnosis not present

## 2022-07-30 DIAGNOSIS — L89610 Pressure ulcer of right heel, unstageable: Secondary | ICD-10-CM | POA: Diagnosis not present

## 2022-07-30 DIAGNOSIS — F01B3 Vascular dementia, moderate, with mood disturbance: Secondary | ICD-10-CM | POA: Diagnosis not present

## 2022-07-31 DIAGNOSIS — L89610 Pressure ulcer of right heel, unstageable: Secondary | ICD-10-CM | POA: Diagnosis not present

## 2022-07-31 DIAGNOSIS — R4182 Altered mental status, unspecified: Secondary | ICD-10-CM | POA: Diagnosis not present

## 2022-07-31 DIAGNOSIS — I130 Hypertensive heart and chronic kidney disease with heart failure and stage 1 through stage 4 chronic kidney disease, or unspecified chronic kidney disease: Secondary | ICD-10-CM | POA: Diagnosis not present

## 2022-07-31 DIAGNOSIS — N390 Urinary tract infection, site not specified: Secondary | ICD-10-CM | POA: Diagnosis not present

## 2022-07-31 DIAGNOSIS — F0390 Unspecified dementia without behavioral disturbance: Secondary | ICD-10-CM | POA: Diagnosis not present

## 2022-07-31 DIAGNOSIS — E1122 Type 2 diabetes mellitus with diabetic chronic kidney disease: Secondary | ICD-10-CM | POA: Diagnosis not present

## 2022-07-31 DIAGNOSIS — I5030 Unspecified diastolic (congestive) heart failure: Secondary | ICD-10-CM | POA: Diagnosis not present

## 2022-07-31 DIAGNOSIS — E1151 Type 2 diabetes mellitus with diabetic peripheral angiopathy without gangrene: Secondary | ICD-10-CM | POA: Diagnosis not present

## 2022-07-31 DIAGNOSIS — E1140 Type 2 diabetes mellitus with diabetic neuropathy, unspecified: Secondary | ICD-10-CM | POA: Diagnosis not present

## 2022-08-03 ENCOUNTER — Telehealth: Payer: Self-pay

## 2022-08-03 DIAGNOSIS — I130 Hypertensive heart and chronic kidney disease with heart failure and stage 1 through stage 4 chronic kidney disease, or unspecified chronic kidney disease: Secondary | ICD-10-CM | POA: Diagnosis not present

## 2022-08-03 DIAGNOSIS — E1122 Type 2 diabetes mellitus with diabetic chronic kidney disease: Secondary | ICD-10-CM | POA: Diagnosis not present

## 2022-08-03 DIAGNOSIS — I739 Peripheral vascular disease, unspecified: Secondary | ICD-10-CM | POA: Diagnosis not present

## 2022-08-03 DIAGNOSIS — E1151 Type 2 diabetes mellitus with diabetic peripheral angiopathy without gangrene: Secondary | ICD-10-CM | POA: Diagnosis not present

## 2022-08-03 DIAGNOSIS — L89609 Pressure ulcer of unspecified heel, unspecified stage: Secondary | ICD-10-CM | POA: Diagnosis not present

## 2022-08-03 DIAGNOSIS — I5030 Unspecified diastolic (congestive) heart failure: Secondary | ICD-10-CM | POA: Diagnosis not present

## 2022-08-03 DIAGNOSIS — E1140 Type 2 diabetes mellitus with diabetic neuropathy, unspecified: Secondary | ICD-10-CM | POA: Diagnosis not present

## 2022-08-03 DIAGNOSIS — I10 Essential (primary) hypertension: Secondary | ICD-10-CM | POA: Diagnosis not present

## 2022-08-03 DIAGNOSIS — L89610 Pressure ulcer of right heel, unstageable: Secondary | ICD-10-CM | POA: Diagnosis not present

## 2022-08-03 NOTE — Telephone Encounter (Signed)
I was not the one who prescribed it. Can you pls check with her daughter Dr. Hyacinth Meeker? I can Rx it if needed, I just don't know the dose because it was started at facility. Thanks

## 2022-08-03 NOTE — Telephone Encounter (Signed)
Pharmacy is asking for refill on Risperidone 0.25 mg are you refilling this? Looks like another MD sent in last refill

## 2022-08-04 ENCOUNTER — Encounter: Payer: Self-pay | Admitting: Neurology

## 2022-08-04 NOTE — Telephone Encounter (Signed)
Pt daughter sent a mychart about Risperidone 0.25 mg

## 2022-08-05 DIAGNOSIS — I5030 Unspecified diastolic (congestive) heart failure: Secondary | ICD-10-CM | POA: Diagnosis not present

## 2022-08-05 DIAGNOSIS — L89610 Pressure ulcer of right heel, unstageable: Secondary | ICD-10-CM | POA: Diagnosis not present

## 2022-08-05 DIAGNOSIS — E1140 Type 2 diabetes mellitus with diabetic neuropathy, unspecified: Secondary | ICD-10-CM | POA: Diagnosis not present

## 2022-08-05 DIAGNOSIS — E1122 Type 2 diabetes mellitus with diabetic chronic kidney disease: Secondary | ICD-10-CM | POA: Diagnosis not present

## 2022-08-05 DIAGNOSIS — E1151 Type 2 diabetes mellitus with diabetic peripheral angiopathy without gangrene: Secondary | ICD-10-CM | POA: Diagnosis not present

## 2022-08-05 DIAGNOSIS — I130 Hypertensive heart and chronic kidney disease with heart failure and stage 1 through stage 4 chronic kidney disease, or unspecified chronic kidney disease: Secondary | ICD-10-CM | POA: Diagnosis not present

## 2022-08-05 MED ORDER — RISPERIDONE 0.25 MG PO TABS: 0.2500 mg | ORAL_TABLET | Freq: Every day | ORAL | 3 refills | Status: DC

## 2022-08-06 DIAGNOSIS — I7091 Generalized atherosclerosis: Secondary | ICD-10-CM | POA: Diagnosis not present

## 2022-08-06 DIAGNOSIS — B351 Tinea unguium: Secondary | ICD-10-CM | POA: Diagnosis not present

## 2022-08-07 DIAGNOSIS — E1122 Type 2 diabetes mellitus with diabetic chronic kidney disease: Secondary | ICD-10-CM | POA: Diagnosis not present

## 2022-08-07 DIAGNOSIS — I5030 Unspecified diastolic (congestive) heart failure: Secondary | ICD-10-CM | POA: Diagnosis not present

## 2022-08-07 DIAGNOSIS — L89610 Pressure ulcer of right heel, unstageable: Secondary | ICD-10-CM | POA: Diagnosis not present

## 2022-08-07 DIAGNOSIS — I130 Hypertensive heart and chronic kidney disease with heart failure and stage 1 through stage 4 chronic kidney disease, or unspecified chronic kidney disease: Secondary | ICD-10-CM | POA: Diagnosis not present

## 2022-08-07 DIAGNOSIS — E1151 Type 2 diabetes mellitus with diabetic peripheral angiopathy without gangrene: Secondary | ICD-10-CM | POA: Diagnosis not present

## 2022-08-07 DIAGNOSIS — E1140 Type 2 diabetes mellitus with diabetic neuropathy, unspecified: Secondary | ICD-10-CM | POA: Diagnosis not present

## 2022-08-10 DIAGNOSIS — E1122 Type 2 diabetes mellitus with diabetic chronic kidney disease: Secondary | ICD-10-CM | POA: Diagnosis not present

## 2022-08-10 DIAGNOSIS — E1151 Type 2 diabetes mellitus with diabetic peripheral angiopathy without gangrene: Secondary | ICD-10-CM | POA: Diagnosis not present

## 2022-08-10 DIAGNOSIS — E1140 Type 2 diabetes mellitus with diabetic neuropathy, unspecified: Secondary | ICD-10-CM | POA: Diagnosis not present

## 2022-08-10 DIAGNOSIS — I130 Hypertensive heart and chronic kidney disease with heart failure and stage 1 through stage 4 chronic kidney disease, or unspecified chronic kidney disease: Secondary | ICD-10-CM | POA: Diagnosis not present

## 2022-08-10 DIAGNOSIS — I5030 Unspecified diastolic (congestive) heart failure: Secondary | ICD-10-CM | POA: Diagnosis not present

## 2022-08-10 DIAGNOSIS — L89610 Pressure ulcer of right heel, unstageable: Secondary | ICD-10-CM | POA: Diagnosis not present

## 2022-08-11 DIAGNOSIS — F0390 Unspecified dementia without behavioral disturbance: Secondary | ICD-10-CM | POA: Diagnosis not present

## 2022-08-11 DIAGNOSIS — I10 Essential (primary) hypertension: Secondary | ICD-10-CM | POA: Diagnosis not present

## 2022-08-11 DIAGNOSIS — R4182 Altered mental status, unspecified: Secondary | ICD-10-CM | POA: Diagnosis not present

## 2022-08-11 DIAGNOSIS — R451 Restlessness and agitation: Secondary | ICD-10-CM | POA: Diagnosis not present

## 2022-08-12 DIAGNOSIS — E1122 Type 2 diabetes mellitus with diabetic chronic kidney disease: Secondary | ICD-10-CM | POA: Diagnosis not present

## 2022-08-12 DIAGNOSIS — F0390 Unspecified dementia without behavioral disturbance: Secondary | ICD-10-CM | POA: Diagnosis not present

## 2022-08-12 DIAGNOSIS — E1151 Type 2 diabetes mellitus with diabetic peripheral angiopathy without gangrene: Secondary | ICD-10-CM | POA: Diagnosis not present

## 2022-08-12 DIAGNOSIS — R451 Restlessness and agitation: Secondary | ICD-10-CM | POA: Diagnosis not present

## 2022-08-12 DIAGNOSIS — I130 Hypertensive heart and chronic kidney disease with heart failure and stage 1 through stage 4 chronic kidney disease, or unspecified chronic kidney disease: Secondary | ICD-10-CM | POA: Diagnosis not present

## 2022-08-12 DIAGNOSIS — L89610 Pressure ulcer of right heel, unstageable: Secondary | ICD-10-CM | POA: Diagnosis not present

## 2022-08-12 DIAGNOSIS — I10 Essential (primary) hypertension: Secondary | ICD-10-CM | POA: Diagnosis not present

## 2022-08-12 DIAGNOSIS — E1140 Type 2 diabetes mellitus with diabetic neuropathy, unspecified: Secondary | ICD-10-CM | POA: Diagnosis not present

## 2022-08-12 DIAGNOSIS — I5030 Unspecified diastolic (congestive) heart failure: Secondary | ICD-10-CM | POA: Diagnosis not present

## 2022-08-13 ENCOUNTER — Ambulatory Visit (INDEPENDENT_AMBULATORY_CARE_PROVIDER_SITE_OTHER): Payer: Medicare Other | Admitting: Vascular Surgery

## 2022-08-13 ENCOUNTER — Encounter: Payer: Self-pay | Admitting: Vascular Surgery

## 2022-08-13 VITALS — BP 176/80 | HR 59 | Temp 97.9°F | Resp 20 | Wt 129.0 lb

## 2022-08-13 DIAGNOSIS — L97909 Non-pressure chronic ulcer of unspecified part of unspecified lower leg with unspecified severity: Secondary | ICD-10-CM | POA: Diagnosis not present

## 2022-08-13 DIAGNOSIS — I70299 Other atherosclerosis of native arteries of extremities, unspecified extremity: Secondary | ICD-10-CM | POA: Diagnosis not present

## 2022-08-13 NOTE — Progress Notes (Signed)
ASSESSMENT & PLAN   PERIPHERAL ARTERIAL DISEASE WITH RIGHT HEEL ULCER: This patient has evidence of infrainguinal arterial occlusive disease on the right likely tibial arterial occlusive disease.  However her toe pressure is 70 which would suggest adequate circulation for healing although it is not normal.  The wound is reportedly improving.  Given that she is 89 years old, all things considered I would favor continued meticulous wound care and give this a chance to heal without considering arteriography.  Certainly if the wound stalls or worsens we should consider arteriography in order to determine if she is a candidate for an endovascular approach to her tibial disease.  We have discussed the potential risks associated with this and the patient's daughter and I agree that for now we will continue conservative treatment unless something changes.  I will be happy to see her back at any time if the wound stops making progress.   REASON FOR CONSULT:    Right heel ulcer.  The consult is requested by Dr. Ardelle Anton.   HPI:   [name] is a 89 y.o. female who was hospitalized in October of last year after being dehydrated.  She then went to a skilled nursing facility with memory care.  This is where she currently resides.  Around November of last year she developed a wound on the right heel and has been getting aggressive wound care.  Reportedly the wound is gradually improving.  She was seen by podiatry and referred for vascular consultation.  Her activity is very limited.  She walks some with a walker but denies any claudication.  She denies any history of rest pain.  She does have a history of dementia.  Her risk factors for peripheral arterial disease include type 2 diabetes, hypertension, hypercholesterolemia, and a remote history of tobacco use.  She quit smoking in 1972.  She has undergone previous coronary revascularization.  Vein was taken from the right leg.  She has had some  chronic leg swelling but has no history of DVT.  Past Medical History:  Diagnosis Date   Acute cystitis without hematuria 02/04/2022   Acute metabolic encephalopathy 01/31/2022   Acute respiratory failure with hypoxia 01/30/2022   AKI (acute kidney injury) 01/31/2022   CAD, NATIVE VESSEL 06/09/2008   Dehydration 01/30/2022   Delirium with dementia 01/08/2022   DIABETES MELLITUS, TYPE II    Encounter for testing for latent tuberculosis 01/13/2022   FIBROMYALGIA    GERD    Gout    HIATAL HERNIA    HYPERLIPIDEMIA    HYPERTENSION    Hypokalemia 01/31/2022   Insomnia    OA (osteoarthritis)    severe, shoulders, hands - inflammatory, ?RA   Pressure ulcer of heel, right, unstageable 06/12/2022   S/P CABG x 1    S/P inguinal hernia repair    TUBULOVILLOUS ADENOMA, 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   colo 04/2012 - no polyps - no further screening planned (age >36)   UNSPECIFIED ANEMIA    Wrist pain, acute, right 03/18/2022    Family History  Problem Relation Age of Onset   Cancer Mother        Stomach   Stomach cancer Mother 64   Hypertension Mother    Heart disease Father 49       MI   Diabetes Father    Heart attack Father    Vascular Disease Sister    Stroke Sister    Dementia Sister    Diabetes Sister    Parkinsonism Sister  Stroke Sister    Heart disease Sister    Heart attack Brother    Stroke Daughter    Diabetes Daughter    Hypertension Daughter    Colon cancer Neg Hx     SOCIAL HISTORY: Social History   Tobacco Use   Smoking status: Former    Types: Cigarettes    Quit date: 04/26/1970    Years since quitting: 52.3   Smokeless tobacco: Never  Substance Use Topics   Alcohol use: Not Currently    Alcohol/week: 1.0 standard drink of alcohol    Types: 1 Glasses of wine per week    Allergies  Allergen Reactions   Depakote [Divalproex Sodium] Other (See Comments)    Had worsening neutropenia and thrombocytopenia on medication.  Joycelyn Man FNP-C    Current  Outpatient Medications  Medication Sig Dispense Refill   acetaminophen (TYLENOL) 500 MG tablet Take 1,000 mg by mouth 2 (two) times daily.      amLODipine (NORVASC) 5 MG tablet Take 1 tablet (5 mg total) by mouth 2 (two) times daily. 180 tablet 3   amoxicillin-clavulanate (AUGMENTIN) 875-125 MG tablet Take 1 tablet by mouth 2 (two) times daily.     aspirin EC 81 MG tablet Take 81 mg by mouth daily.     busPIRone (BUSPAR) 15 MG tablet Take 1 tablet (15 mg total) by mouth 2 (two) times daily. 180 tablet 3   carvedilol (COREG) 6.25 MG tablet TAKE ONE TABLET BY MOUTH TWICE A DAY 180 tablet 3   cetirizine (ZYRTEC) 10 MG tablet TAKE ONE TABLET BY MOUTH DAILY 90 tablet 3   Cholecalciferol (D3-1000) 25 MCG (1000 UT) capsule Take 1,000 Units by mouth daily.     donepezil (ARICEPT) 10 MG tablet TAKE ONE TABLET BY MOUTH DAILY 90 tablet 3   ferrous sulfate 324 MG TBEC Take 324 mg by mouth daily.     gabapentin (NEURONTIN) 300 MG capsule Take 1 capsule (300 mg total) by mouth at bedtime. 90 capsule 3   MELATONIN PO Take 6 mg by mouth at bedtime.     meloxicam (MOBIC) 7.5 MG tablet Take 7.5 mg by mouth daily.     memantine (NAMENDA) 10 MG tablet Take 1 tablet (10 mg total) by mouth 2 (two) times daily. 180 tablet 3   Menthol, Topical Analgesic, (BIOFREEZE) 4 % GEL Apply 1 Application topically in the morning, at noon, and at bedtime. To mid back and right wrist     omeprazole (PRILOSEC) 40 MG capsule Take 1 capsule (40 mg total) by mouth daily. 90 capsule 3   QUEtiapine (SEROQUEL) 50 MG tablet Take 1 tablet (50 mg total) by mouth at bedtime. 90 tablet 3   risperiDONE (RISPERDAL) 0.25 MG tablet Take 1 tablet (0.25 mg total) by mouth daily. 90 tablet 3   SANTYL 250 UNIT/GM ointment Apply 1 Application topically daily.     traMADol (ULTRAM) 50 MG tablet Take 50 mg by mouth 3 (three) times daily as needed for moderate pain.     zinc oxide 20 % ointment Apply 1 Application topically 2 (two) times daily.     No  current facility-administered medications for this visit.    REVIEW OF SYSTEMS:   denotes positive finding,  denotes negative finding Cardiac  Comments:  Chest pain or chest pressure:    Shortness of breath upon exertion:    Short of breath when lying flat:    Irregular heart rhythm:        Vascular  Pain in calf, thigh, or hip brought on by ambulation:    Pain in feet at night that wakes you up from your sleep:     Blood clot in your veins:    Leg swelling:         Pulmonary    Oxygen at home:    Productive cough:     Wheezing:         Neurologic    Sudden weakness in arms or legs:     Sudden numbness in arms or legs:     Sudden onset of difficulty speaking or slurred speech:    Temporary loss of vision in one eye:     Problems with dizziness:         Gastrointestinal    Blood in stool:     Vomited blood:         Genitourinary    Burning when urinating:     Blood in urine:        Psychiatric    Major depression:         Hematologic    Bleeding problems:    Problems with blood clotting too easily:        Skin    Rashes or ulcers:        Constitutional    Fever or chills:    -  PHYSICAL EXAM:   Vitals:   08/13/22 1354  BP: (!) 176/80  Pulse: (!) 59  Resp: 20  Temp: 97.9 F (36.6 C)  SpO2: 93%  Weight: 129 lb (58.5 kg)   Body mass index is 26.05 kg/m.  GENERAL: The patient is a well-nourished female, in no acute distress. The vital signs are documented above. CARDIAC: There is a regular rate and rhythm.  VASCULAR: I do not detect carotid bruits. She has palpable femoral pulses. On the right side she has a monophasic posterior tibial signal with a barely biphasic anterior tibial and dorsalis pedis signal.  She has a barely biphasic peroneal signal on the right. On the left side she has a monophasic posterior tibial signal, dorsalis pedis signal, and peroneal signal. She has bilateral lower extremity swelling. PULMONARY: There is good air  exchange bilaterally without wheezing or rales. ABDOMEN: Soft and non-tender with normal pitched bowel sounds.  MUSCULOSKELETAL: There are no major deformities. NEUROLOGIC: No focal weakness or paresthesias are detected. SKIN: She has a small wound on the lateral aspect of her right heel.     PSYCHIATRIC: The patient has a normal affect.  DATA:    ARTERIAL DOPPLER STUDY: I have reviewed the arterial Doppler study that was done on 07/09/2022.  On the right side there was a monophasic posterior tibial signal with a biphasic dorsalis pedis signal.  The arteries were calcified and an ABI could not be obtained.  Toe pressure was 78 mmHg.  On the left side there was a monophasic posterior tibial signal with a biphasic dorsalis pedis signal.  Again the arteries were calcified and could not be compressed.  Toe pressure was 106 mmHg on the left.  Waverly Ferrari [facility]

## 2022-08-14 DIAGNOSIS — R410 Disorientation, unspecified: Secondary | ICD-10-CM | POA: Diagnosis not present

## 2022-08-14 DIAGNOSIS — E1140 Type 2 diabetes mellitus with diabetic neuropathy, unspecified: Secondary | ICD-10-CM | POA: Diagnosis not present

## 2022-08-14 DIAGNOSIS — I130 Hypertensive heart and chronic kidney disease with heart failure and stage 1 through stage 4 chronic kidney disease, or unspecified chronic kidney disease: Secondary | ICD-10-CM | POA: Diagnosis not present

## 2022-08-14 DIAGNOSIS — E1122 Type 2 diabetes mellitus with diabetic chronic kidney disease: Secondary | ICD-10-CM | POA: Diagnosis not present

## 2022-08-14 DIAGNOSIS — E1151 Type 2 diabetes mellitus with diabetic peripheral angiopathy without gangrene: Secondary | ICD-10-CM | POA: Diagnosis not present

## 2022-08-14 DIAGNOSIS — L89610 Pressure ulcer of right heel, unstageable: Secondary | ICD-10-CM | POA: Diagnosis not present

## 2022-08-14 DIAGNOSIS — R4182 Altered mental status, unspecified: Secondary | ICD-10-CM | POA: Diagnosis not present

## 2022-08-14 DIAGNOSIS — I5030 Unspecified diastolic (congestive) heart failure: Secondary | ICD-10-CM | POA: Diagnosis not present

## 2022-08-14 DIAGNOSIS — F0390 Unspecified dementia without behavioral disturbance: Secondary | ICD-10-CM | POA: Diagnosis not present

## 2022-08-17 DIAGNOSIS — I5030 Unspecified diastolic (congestive) heart failure: Secondary | ICD-10-CM | POA: Diagnosis not present

## 2022-08-17 DIAGNOSIS — E1122 Type 2 diabetes mellitus with diabetic chronic kidney disease: Secondary | ICD-10-CM | POA: Diagnosis not present

## 2022-08-17 DIAGNOSIS — I130 Hypertensive heart and chronic kidney disease with heart failure and stage 1 through stage 4 chronic kidney disease, or unspecified chronic kidney disease: Secondary | ICD-10-CM | POA: Diagnosis not present

## 2022-08-17 DIAGNOSIS — I10 Essential (primary) hypertension: Secondary | ICD-10-CM | POA: Diagnosis not present

## 2022-08-17 DIAGNOSIS — E1151 Type 2 diabetes mellitus with diabetic peripheral angiopathy without gangrene: Secondary | ICD-10-CM | POA: Diagnosis not present

## 2022-08-17 DIAGNOSIS — L89610 Pressure ulcer of right heel, unstageable: Secondary | ICD-10-CM | POA: Diagnosis not present

## 2022-08-17 DIAGNOSIS — E1140 Type 2 diabetes mellitus with diabetic neuropathy, unspecified: Secondary | ICD-10-CM | POA: Diagnosis not present

## 2022-08-17 DIAGNOSIS — N390 Urinary tract infection, site not specified: Secondary | ICD-10-CM | POA: Diagnosis not present

## 2022-08-19 DIAGNOSIS — I10 Essential (primary) hypertension: Secondary | ICD-10-CM | POA: Diagnosis not present

## 2022-08-19 DIAGNOSIS — E1151 Type 2 diabetes mellitus with diabetic peripheral angiopathy without gangrene: Secondary | ICD-10-CM | POA: Diagnosis not present

## 2022-08-19 DIAGNOSIS — E1140 Type 2 diabetes mellitus with diabetic neuropathy, unspecified: Secondary | ICD-10-CM | POA: Diagnosis not present

## 2022-08-19 DIAGNOSIS — I5030 Unspecified diastolic (congestive) heart failure: Secondary | ICD-10-CM | POA: Diagnosis not present

## 2022-08-19 DIAGNOSIS — I739 Peripheral vascular disease, unspecified: Secondary | ICD-10-CM | POA: Diagnosis not present

## 2022-08-19 DIAGNOSIS — E1122 Type 2 diabetes mellitus with diabetic chronic kidney disease: Secondary | ICD-10-CM | POA: Diagnosis not present

## 2022-08-19 DIAGNOSIS — L89610 Pressure ulcer of right heel, unstageable: Secondary | ICD-10-CM | POA: Diagnosis not present

## 2022-08-19 DIAGNOSIS — I130 Hypertensive heart and chronic kidney disease with heart failure and stage 1 through stage 4 chronic kidney disease, or unspecified chronic kidney disease: Secondary | ICD-10-CM | POA: Diagnosis not present

## 2022-08-19 DIAGNOSIS — L89609 Pressure ulcer of unspecified heel, unspecified stage: Secondary | ICD-10-CM | POA: Diagnosis not present

## 2022-08-20 ENCOUNTER — Ambulatory Visit (INDEPENDENT_AMBULATORY_CARE_PROVIDER_SITE_OTHER): Payer: Medicare Other | Admitting: Podiatry

## 2022-08-20 ENCOUNTER — Ambulatory Visit (INDEPENDENT_AMBULATORY_CARE_PROVIDER_SITE_OTHER): Payer: Medicare Other

## 2022-08-20 DIAGNOSIS — L97412 Non-pressure chronic ulcer of right heel and midfoot with fat layer exposed: Secondary | ICD-10-CM | POA: Diagnosis not present

## 2022-08-20 NOTE — Patient Instructions (Signed)
Continue with Santyl dressing changes daily. Offload the heels at all times Monitor for any signs/symptoms of infection. Call the office immediately if any occur or go directly to the emergency room. Call with any questions/concerns.

## 2022-08-21 DIAGNOSIS — E1140 Type 2 diabetes mellitus with diabetic neuropathy, unspecified: Secondary | ICD-10-CM | POA: Diagnosis not present

## 2022-08-21 DIAGNOSIS — E1151 Type 2 diabetes mellitus with diabetic peripheral angiopathy without gangrene: Secondary | ICD-10-CM | POA: Diagnosis not present

## 2022-08-21 DIAGNOSIS — E1122 Type 2 diabetes mellitus with diabetic chronic kidney disease: Secondary | ICD-10-CM | POA: Diagnosis not present

## 2022-08-21 DIAGNOSIS — I130 Hypertensive heart and chronic kidney disease with heart failure and stage 1 through stage 4 chronic kidney disease, or unspecified chronic kidney disease: Secondary | ICD-10-CM | POA: Diagnosis not present

## 2022-08-21 DIAGNOSIS — I5030 Unspecified diastolic (congestive) heart failure: Secondary | ICD-10-CM | POA: Diagnosis not present

## 2022-08-21 DIAGNOSIS — L89610 Pressure ulcer of right heel, unstageable: Secondary | ICD-10-CM | POA: Diagnosis not present

## 2022-08-23 NOTE — Progress Notes (Signed)
Subjective: Chief Complaint  Patient presents with   Wound Check    Heel ulcer right foot. Nurse at assisting living changes dressing three times a week.     89 year old female presents For above concerns with her daughter.  They have been continue with Santyl dressing changes 3 times a week and offloading.  Thinks the wound is doing better.  No fevers or chills.  Objective: AAO x3, NAD Pulses decreased but the foot is warm and perfused. Ulceration noted to the right lateral heel.  Overall the wound is doing better.  This measures 3 following 1 cm.  There is granulation tissue present with some of the fibrotic tissue.  There is no probing, or tunneling.  No surrounding erythema, ascending cellulitis.  No fluctuance or palpation there is no malodor.  Pre and post wound measurements are the same. No pain with calf compression, swelling, warmth, erythema    Assessment: 89 year old female with heel ulceration right foot  Plan: -All treatment options discussed with the patient including all alternatives, risks, complications.  -X-rays were obtained and reviewed.  3 views of the foot were obtained.  Osteopenia is present.  No definitive cortical changes to suggest osteomyelitis at this time.  There is no soft tissue edema. -Medically necessary wound debridement is performed of the right heel given the fibrotic tissue along the wound.  I utilized a #312 scalpel to sharply debride the wound down to healthy, bleeding, granular tissue to promote wound healing.  There is minimal blood loss.  Hemostasis achieved with manual compression.  Medihoney was applied followed by dressing.  She is to continue with Santyl dressing changes.  Offloading. -Monitor for any clinical signs or symptoms of infection and directed to call the office immediately should any occur or go to the ER.  Return in about 3 weeks (around 09/10/2022) for heel ulcer.  Vivi Barrack DPM

## 2022-08-24 DIAGNOSIS — L89610 Pressure ulcer of right heel, unstageable: Secondary | ICD-10-CM | POA: Diagnosis not present

## 2022-08-24 DIAGNOSIS — I130 Hypertensive heart and chronic kidney disease with heart failure and stage 1 through stage 4 chronic kidney disease, or unspecified chronic kidney disease: Secondary | ICD-10-CM | POA: Diagnosis not present

## 2022-08-24 DIAGNOSIS — E1122 Type 2 diabetes mellitus with diabetic chronic kidney disease: Secondary | ICD-10-CM | POA: Diagnosis not present

## 2022-08-24 DIAGNOSIS — E1140 Type 2 diabetes mellitus with diabetic neuropathy, unspecified: Secondary | ICD-10-CM | POA: Diagnosis not present

## 2022-08-24 DIAGNOSIS — I5030 Unspecified diastolic (congestive) heart failure: Secondary | ICD-10-CM | POA: Diagnosis not present

## 2022-08-24 DIAGNOSIS — E1151 Type 2 diabetes mellitus with diabetic peripheral angiopathy without gangrene: Secondary | ICD-10-CM | POA: Diagnosis not present

## 2022-08-26 DIAGNOSIS — I5030 Unspecified diastolic (congestive) heart failure: Secondary | ICD-10-CM | POA: Diagnosis not present

## 2022-08-26 DIAGNOSIS — E1151 Type 2 diabetes mellitus with diabetic peripheral angiopathy without gangrene: Secondary | ICD-10-CM | POA: Diagnosis not present

## 2022-08-26 DIAGNOSIS — I10 Essential (primary) hypertension: Secondary | ICD-10-CM | POA: Diagnosis not present

## 2022-08-26 DIAGNOSIS — E1140 Type 2 diabetes mellitus with diabetic neuropathy, unspecified: Secondary | ICD-10-CM | POA: Diagnosis not present

## 2022-08-26 DIAGNOSIS — I739 Peripheral vascular disease, unspecified: Secondary | ICD-10-CM | POA: Diagnosis not present

## 2022-08-26 DIAGNOSIS — I130 Hypertensive heart and chronic kidney disease with heart failure and stage 1 through stage 4 chronic kidney disease, or unspecified chronic kidney disease: Secondary | ICD-10-CM | POA: Diagnosis not present

## 2022-08-26 DIAGNOSIS — E1122 Type 2 diabetes mellitus with diabetic chronic kidney disease: Secondary | ICD-10-CM | POA: Diagnosis not present

## 2022-08-26 DIAGNOSIS — J9600 Acute respiratory failure, unspecified whether with hypoxia or hypercapnia: Secondary | ICD-10-CM | POA: Diagnosis not present

## 2022-08-26 DIAGNOSIS — L89610 Pressure ulcer of right heel, unstageable: Secondary | ICD-10-CM | POA: Diagnosis not present

## 2022-08-26 DIAGNOSIS — Z0000 Encounter for general adult medical examination without abnormal findings: Secondary | ICD-10-CM | POA: Diagnosis not present

## 2022-08-26 DIAGNOSIS — L89609 Pressure ulcer of unspecified heel, unspecified stage: Secondary | ICD-10-CM | POA: Diagnosis not present

## 2022-08-27 ENCOUNTER — Non-Acute Institutional Stay: Payer: Medicare Other | Admitting: Family Medicine

## 2022-08-27 ENCOUNTER — Encounter: Payer: Self-pay | Admitting: Family Medicine

## 2022-08-27 VITALS — BP 136/56 | HR 76 | Temp 97.7°F | Resp 18

## 2022-08-27 DIAGNOSIS — I739 Peripheral vascular disease, unspecified: Secondary | ICD-10-CM | POA: Insufficient documentation

## 2022-08-27 DIAGNOSIS — L89610 Pressure ulcer of right heel, unstageable: Secondary | ICD-10-CM

## 2022-08-27 DIAGNOSIS — F03918 Unspecified dementia, unspecified severity, with other behavioral disturbance: Secondary | ICD-10-CM

## 2022-08-27 NOTE — Progress Notes (Signed)
Therapist, nutritional Palliative Care Consult Note Telephone: [phone]  Fax: [phone]   Date of encounter: 08/27/22 9:01 AM PATIENT NAME: [name] [address] [place] [address]   [phone] (home)  DOB: 1930-08-31 MRN: [id number] PRIMARY CARE PROVIDER:    Pincus Sanes, MD,  [address] [place] [address] [phone]  REFERRING PROVIDER:   Christene Lye, NP   Emergency Contact:    Contact Information     Name Relation Home Work [place] Daughter   [phone]   Jaileen, Malkiewicz   [phone]       Health Care POA-daughter, Dr Gardiner Ramus   I met face to face with patient in [facility] facility. Palliative Care was asked to follow this patient by consultation request of Christene Lye, facility NP to address advance care planning and complex medical decision making. This is a follow up visit.  ADVANCE CARE PLANNING/GOALS OF CARE:  Review of an existing advance directive document-HC POA and Adv directive  Life prolonging treatment can be withheld:  if I have an incurable or irreversible condition which will result in death in a short period of time; if I am in a coma or persistent vegetative state which is reasonably concluded to be irreversible.  CODE STATUS: Full Code   ASSESSMENT AND / RECOMMENDATIONS:  PPS: 50%  Peripheral arterial disease Stable without claudication symptoms. Continue ASA 81 mg daily. Monitor for increasing pain with activity or rest. Monitor for temperature (cold) with color change (dark purple/brown) especially if doesn't improve with elevation or if accompanied by severe pain.  2.    Pressure ulcer of right heel unstageable Improving, follows with wound care and Dr Ardelle Anton with facility doing 3 times weekly dressing changes. Recent sharp debridement to good healthy tissue with good blood supply. Being followed by vascular surgery  conservatively. Monitor for odor, drainage or worsening size. Has Tramadol 50 mg TID prn pain.  3.    Dementia with behavioral disturbance FAST 7 score 6c Continues on Aricept 10 mg daily and Namenda 10 mg BID. Risperidone BID and Seroquel QHS appear to be providing some alleviation of mood disturbance    Follow up Palliative Care Visit:  Palliative Care continuing to follow up by monitoring for changes in appetite, weight, functional and cognitive status for chronic disease progression and management in agreement with patient's stated goals of care. Next visit in 4 weeks or prn.  This visit was coded based on medical decision making (MDM).  Chief Complaint  Palliative Care is continuing to follow patient for chronic medical management in setting of dementia with heel ulcer followed by vascular surgery, podiatry and the wound care center.  HISTORY OF PRESENT ILLNESS: [name] is a 89 y.o. year old female with Dementia.  She is being seen by vascular surgery (Dr Waverly Ferrari), Podiatry (Dr Ardelle Anton) and wound care center for heel pressure ulcer. She was diagnosed with PAD and right heel ulcer with infrainguinal arterial occlusive disease on the right with likely tibial arterial occlusive disease. Her right toe pressure was 70 which he indicates should be sufficient for healing although not normal. He recommended given her age, current wound healing that she have conservative management with meticulous wound care without intervention by arteriography at present unless wound healing stalls.  She has protective boots to wear at night. She was last seen by Podiatry on 08/20/22 indicating some healing with wound care 3 times weekly done  by facility using Santyl and offloading. He did wound debridement to remove fibrotic tissue over the wound. Over the course of the last 2 months pt has received treatment on 3 occasions for UTI, most recent was E coli and pt was treated with Fosfomycin.  HGB A1c  shows pt to be diabetic with good dietary control. Pt states she is having some mild pain in her right heel but it is healing and doing better.  She is currently wearing tennis shoes and participating in a group music activity.  Denies fever, chills, dysuria, CP, SOB, constipation, nausea or vomiting.   ACTIVITIES OF DAILY LIVING: CONTINENT OF BLADDER/ BOWEL? Yes, requires some assist with toileting BATHING/DRESSING/FEEDING: needs some assistance with bathing, cueing for dressing, independent feeding  MOBILITY:   Rollator with cueing  APPETITE? Good WEIGHT: 129 lbs as of 08/13/22, weight 01/30/22 was 132.7 lbs  CURRENT PROBLEM LIST:  Patient Active Problem List   Diagnosis Date Noted   Peripheral arterial disease (HCC) 08/27/2022   Pressure ulcer of heel, right, unstageable (HCC) 06/12/2022   Osteoarthritis 02/02/2022   Type 2 diabetes mellitus (HCC) 01/31/2022   Drug reaction 01/30/2022   Thrombocytopenia (HCC) 01/08/2022   Palliative care encounter 09/21/2021   Urinary frequency 09/18/2020   Aortic atherosclerosis (HCC) 08/07/2020   Dementia with behavioral disturbance (HCC) 07/27/2017   Chronic RUQ pain 05/28/2016   Right knee pain 06/14/2015   Diabetic neuropathy (HCC) 03/21/2014   Bunion 03/21/2014   PVD (pulmonary valve disease) 01/23/2014   Onychomycosis 12/20/2013   PVD (peripheral vascular disease) (HCC) 12/19/2013   Gout 12/13/2013   Depression    Lumbar canal stenosis 05/26/2013   Neuralgia neuritis, sciatic nerve 05/26/2013   Degenerative arthritis of lumbar spine with cord compression 04/24/2013   Arthritis of shoulder region, left 09/11/2011   Type II diabetes mellitus with neurological manifestations (HCC) 01/17/2009   Anemia 10/15/2008   CAD, NATIVE VESSEL 06/09/2008   Hyperlipidemia 06/15/2007   Essential hypertension 06/15/2007   GERD 06/15/2007   TUBULOVILLOUS ADENOMA, 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   DIVERTICULOSIS, 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   PAST MEDICAL HISTORY:   Active Ambulatory Problems    Diagnosis Date Noted   TUBULOVILLOUS ADENOMA, 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   Type II diabetes mellitus with neurological manifestations (HCC) 01/17/2009   Hyperlipidemia 06/15/2007   Anemia 10/15/2008   Essential hypertension 06/15/2007   CAD, NATIVE VESSEL 06/09/2008   GERD 06/15/2007   DIVERTICULOSIS, 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   Arthritis of shoulder region, left 09/11/2011   Degenerative arthritis of lumbar spine with cord compression 04/24/2013   Depression    Gout 12/13/2013   PVD (peripheral vascular disease) (HCC) 12/19/2013   Onychomycosis 12/20/2013   PVD (pulmonary valve disease) 01/23/2014   Diabetic neuropathy (HCC) 03/21/2014   Bunion 03/21/2014   Right knee pain 06/14/2015   Chronic RUQ pain 05/28/2016   Dementia with behavioral disturbance (HCC) 07/27/2017   Aortic atherosclerosis (HCC) 08/07/2020   Urinary frequency 09/18/2020   Lumbar canal stenosis 05/26/2013   Neuralgia neuritis, sciatic nerve 05/26/2013   Palliative care encounter 09/21/2021   Thrombocytopenia (HCC) 01/08/2022   Drug reaction 01/30/2022   Type 2 diabetes mellitus (HCC) 01/31/2022   Osteoarthritis 02/02/2022   Pressure ulcer of heel, right, unstageable (HCC) 06/12/2022   Resolved Ambulatory Problems    Diagnosis Date Noted   Dermatophytosis of nail 02/20/2009   HEMORRHOIDS, INTERNAL 03/02/2007   GASTRITIS 03/12/2009   HIATAL HERNIA 10/29/2000   UNSPECIFIED HEMORRHAGE OF GASTROINTESTINAL TRACT 12/21/2008   DRY SKIN 02/20/2009   ARTHRITIS  06/15/2007   Enthesopathy of hip region 11/28/2009   Fibromyalgia syndrome 06/15/2007   INSOMNIA-SLEEP DISORDER-UNSPEC 09/18/2009   Headache(784.0) 04/08/2009   PALPITATIONS, RECURRENT 04/15/2009   Wheezing 09/18/2009   FECAL OCCULT BLOOD 01/16/2009   COLONIC POLYPS, HX OF 09/18/2008   Memory loss 07/23/2010   Palpitations 07/06/2012   Sciatica 09/05/2012   Sacroiliac joint dysfunction of right side 03/22/2013   Non-compliant behavior  12/05/2013   Radiculopathy of lumbar region 01/31/2014   HAV (hallux abducto valgus) 03/21/2014   Tenosynovitis of ankle 03/21/2014   Pronation deformity of ankle, acquired 03/21/2014   Chest pain 04/17/2014   Pain in lower limb 07/18/2014   Non-compliant behavior 11/23/2014   Acute bronchitis 12/03/2014   Cough 05/02/2015   Abdominal pain, epigastric 09/02/2015   AP (abdominal pain) 09/02/2015   Mild cognitive impairment 12/12/2015   Buttock pain 10/30/2016   Abscess of right foot 12/02/2017   Cellulitis of foot 12/05/2017   Benign hypertension 01/04/2014   Delirium with dementia 01/08/2022   Encounter for therapeutic drug level monitoring 01/13/2022   Encounter for testing for latent tuberculosis 01/13/2022   Acute respiratory failure with hypoxia (HCC) 01/30/2022   Dehydration 01/30/2022   AKI (acute kidney injury) (HCC) 01/31/2022   UTI (urinary tract infection) 01/31/2022   Acute metabolic encephalopathy 01/31/2022   Hypoxia 01/31/2022   Hypokalemia 01/31/2022   Acute cystitis without hematuria 02/04/2022   Abnormal urine odor 03/18/2022   Wrist pain, acute, right 03/18/2022   Past Medical History:  Diagnosis Date   DIABETES MELLITUS, TYPE II    FIBROMYALGIA    Gout    HYPERLIPIDEMIA    HYPERTENSION    Insomnia    OA (osteoarthritis)    S/P CABG x 1    S/P inguinal hernia repair    UNSPECIFIED ANEMIA    SOCIAL HX:  Social History   Tobacco Use   Smoking status: Former    Types: Cigarettes    Quit date: 04/26/1970    Years since quitting: 52.3   Smokeless tobacco: Never  Substance Use Topics   Alcohol use: Not Currently    Alcohol/week: 1.0 standard drink of alcohol    Types: 1 Glasses of wine per week   FAMILY HX:  Family History  Problem Relation Age of Onset   Cancer Mother        Stomach   Stomach cancer Mother 45   Hypertension Mother    Heart disease Father 46       MI   Diabetes Father    Heart attack Father    Vascular Disease Sister     Stroke Sister    Dementia Sister    Diabetes Sister    Parkinsonism Sister    Stroke Sister    Heart disease Sister    Heart attack Brother    Stroke Daughter    Diabetes Daughter    Hypertension Daughter    Colon cancer Neg Hx        Preferred Pharmacy: ALLERGIES:  Allergies  Allergen Reactions   Depakote [Divalproex Sodium] Other (See Comments)    Had worsening neutropenia and thrombocytopenia on medication.  Joycelyn Man FNP-C     PERTINENT MEDICATIONS:  Outpatient Encounter Medications as of 08/27/2022  Medication Sig   acetaminophen (TYLENOL) 500 MG tablet Take 1,000 mg by mouth 2 (two) times daily.    amLODipine (NORVASC) 5 MG tablet Take 1 tablet (5 mg total) by mouth 2 (two) times daily.   aspirin EC 81 MG  tablet Take 81 mg by mouth daily.   busPIRone (BUSPAR) 15 MG tablet Take 1 tablet (15 mg total) by mouth 2 (two) times daily.   carvedilol (COREG) 6.25 MG tablet TAKE ONE TABLET BY MOUTH TWICE A DAY   cetirizine (ZYRTEC) 10 MG tablet TAKE ONE TABLET BY MOUTH DAILY   Cholecalciferol (D3-1000) 25 MCG (1000 UT) capsule Take 1,000 Units by mouth daily.   donepezil (ARICEPT) 10 MG tablet TAKE ONE TABLET BY MOUTH DAILY   ferrous sulfate 324 MG TBEC Take 324 mg by mouth daily.   gabapentin (NEURONTIN) 300 MG capsule Take 1 capsule (300 mg total) by mouth at bedtime.   MELATONIN PO Take 6 mg by mouth at bedtime.   meloxicam (MOBIC) 7.5 MG tablet Take 7.5 mg by mouth daily.   memantine (NAMENDA) 10 MG tablet Take 1 tablet (10 mg total) by mouth 2 (two) times daily.   Menthol, Topical Analgesic, (BIOFREEZE) 4 % GEL Apply 1 Application topically in the morning, at noon, and at bedtime. To mid back and right wrist   omeprazole (PRILOSEC) 40 MG capsule Take 1 capsule (40 mg total) by mouth daily.   QUEtiapine (SEROQUEL) 50 MG tablet Take 1 tablet (50 mg total) by mouth at bedtime.   risperiDONE (RISPERDAL) 0.25 MG tablet Take 1 tablet (0.25 mg total) by mouth daily. (Patient  taking differently: Take 0.25 mg by mouth 2 (two) times daily. At 9am and 3pm)   SANTYL 250 UNIT/GM ointment Apply 1 Application topically daily.   traMADol (ULTRAM) 50 MG tablet Take 50 mg by mouth 3 (three) times daily as needed for moderate pain.   zinc oxide 20 % ointment Apply 1 Application topically 2 (two) times daily.   [DISCONTINUED] amoxicillin-clavulanate (AUGMENTIN) 875-125 MG tablet Take 1 tablet by mouth 2 (two) times daily.   No facility-administered encounter medications on file as of 08/27/2022.    History obtained from review of EMR, discussion with facility staff/caregiver and/or patient.   06/20/22 complete right foot xray: IMPRESSION: 1. No fracture or dislocation of the right foot. 2. Moderate first metatarsophalangeal and mild midfoot arthrosis. 3. Diffuse soft tissue edema about the foot and ankle.  07/23/22 CMP unremarkable Lipids with high TC 205 with high normal HDL 111 HGB A1c 5.7% TSH normal at 2.92 CBC remarkable for mildly low PLT 148  08/12/22 Urine cx at facility pos for E coli and Yeast, recommended Bactrim, Nitrofurantoin or Fosfomycin and Fluconazole. Completed single dose Fosfomycin  I reviewed available labs, medications, imaging, studies and related documents from the EMR.  Records reviewed and summarized above.   Physical Exam: GENERAL: NAD LUNGS: CTAB, no increased work of breathing, room air CARDIAC:  S1S2, RRR with no MRG, trace LLE edema/1+ RLE edema, No cyanosis ABD:  Normo-active BS x 4 quads, soft, non-tender EXTREMITIES: Normal ROM, no deformity, strength equal, No muscle atrophy/subcutaneous fat loss NEURO:  No weakness, Noted cognitive impairment PSYCH:  non-anxious affect, A & O x 1  Thank you for the opportunity to participate in the care of [employer]. Please call our main office at [phone] if we can be of additional assistance.    Joycelyn Man FNP-C  [email or web address] .[facility]   Phone:  [phone]

## 2022-08-28 DIAGNOSIS — L89610 Pressure ulcer of right heel, unstageable: Secondary | ICD-10-CM | POA: Diagnosis not present

## 2022-08-28 DIAGNOSIS — E1122 Type 2 diabetes mellitus with diabetic chronic kidney disease: Secondary | ICD-10-CM | POA: Diagnosis not present

## 2022-08-28 DIAGNOSIS — I5030 Unspecified diastolic (congestive) heart failure: Secondary | ICD-10-CM | POA: Diagnosis not present

## 2022-08-28 DIAGNOSIS — E1151 Type 2 diabetes mellitus with diabetic peripheral angiopathy without gangrene: Secondary | ICD-10-CM | POA: Diagnosis not present

## 2022-08-28 DIAGNOSIS — I130 Hypertensive heart and chronic kidney disease with heart failure and stage 1 through stage 4 chronic kidney disease, or unspecified chronic kidney disease: Secondary | ICD-10-CM | POA: Diagnosis not present

## 2022-08-28 DIAGNOSIS — E1140 Type 2 diabetes mellitus with diabetic neuropathy, unspecified: Secondary | ICD-10-CM | POA: Diagnosis not present

## 2022-08-29 DIAGNOSIS — F01B2 Vascular dementia, moderate, with psychotic disturbance: Secondary | ICD-10-CM | POA: Diagnosis not present

## 2022-08-29 DIAGNOSIS — K219 Gastro-esophageal reflux disease without esophagitis: Secondary | ICD-10-CM | POA: Diagnosis not present

## 2022-08-29 DIAGNOSIS — E1122 Type 2 diabetes mellitus with diabetic chronic kidney disease: Secondary | ICD-10-CM | POA: Diagnosis not present

## 2022-08-29 DIAGNOSIS — M4726 Other spondylosis with radiculopathy, lumbar region: Secondary | ICD-10-CM | POA: Diagnosis not present

## 2022-08-29 DIAGNOSIS — Z87440 Personal history of urinary (tract) infections: Secondary | ICD-10-CM | POA: Diagnosis not present

## 2022-08-29 DIAGNOSIS — D631 Anemia in chronic kidney disease: Secondary | ICD-10-CM | POA: Diagnosis not present

## 2022-08-29 DIAGNOSIS — E1140 Type 2 diabetes mellitus with diabetic neuropathy, unspecified: Secondary | ICD-10-CM | POA: Diagnosis not present

## 2022-08-29 DIAGNOSIS — I5030 Unspecified diastolic (congestive) heart failure: Secondary | ICD-10-CM | POA: Diagnosis not present

## 2022-08-29 DIAGNOSIS — M797 Fibromyalgia: Secondary | ICD-10-CM | POA: Diagnosis not present

## 2022-08-29 DIAGNOSIS — N1831 Chronic kidney disease, stage 3a: Secondary | ICD-10-CM | POA: Diagnosis not present

## 2022-08-29 DIAGNOSIS — M5430 Sciatica, unspecified side: Secondary | ICD-10-CM | POA: Diagnosis not present

## 2022-08-29 DIAGNOSIS — L89610 Pressure ulcer of right heel, unstageable: Secondary | ICD-10-CM | POA: Diagnosis not present

## 2022-08-29 DIAGNOSIS — M109 Gout, unspecified: Secondary | ICD-10-CM | POA: Diagnosis not present

## 2022-08-29 DIAGNOSIS — Z951 Presence of aortocoronary bypass graft: Secondary | ICD-10-CM | POA: Diagnosis not present

## 2022-08-29 DIAGNOSIS — M48061 Spinal stenosis, lumbar region without neurogenic claudication: Secondary | ICD-10-CM | POA: Diagnosis not present

## 2022-08-29 DIAGNOSIS — F01B3 Vascular dementia, moderate, with mood disturbance: Secondary | ICD-10-CM | POA: Diagnosis not present

## 2022-08-29 DIAGNOSIS — G4700 Insomnia, unspecified: Secondary | ICD-10-CM | POA: Diagnosis not present

## 2022-08-29 DIAGNOSIS — I2510 Atherosclerotic heart disease of native coronary artery without angina pectoris: Secondary | ICD-10-CM | POA: Diagnosis not present

## 2022-08-29 DIAGNOSIS — E785 Hyperlipidemia, unspecified: Secondary | ICD-10-CM | POA: Diagnosis not present

## 2022-08-29 DIAGNOSIS — F32A Depression, unspecified: Secondary | ICD-10-CM | POA: Diagnosis not present

## 2022-08-29 DIAGNOSIS — M21619 Bunion of unspecified foot: Secondary | ICD-10-CM | POA: Diagnosis not present

## 2022-08-29 DIAGNOSIS — M19012 Primary osteoarthritis, left shoulder: Secondary | ICD-10-CM | POA: Diagnosis not present

## 2022-08-29 DIAGNOSIS — I130 Hypertensive heart and chronic kidney disease with heart failure and stage 1 through stage 4 chronic kidney disease, or unspecified chronic kidney disease: Secondary | ICD-10-CM | POA: Diagnosis not present

## 2022-08-29 DIAGNOSIS — E1151 Type 2 diabetes mellitus with diabetic peripheral angiopathy without gangrene: Secondary | ICD-10-CM | POA: Diagnosis not present

## 2022-08-29 DIAGNOSIS — M769 Unspecified enthesopathy, lower limb, excluding foot: Secondary | ICD-10-CM | POA: Diagnosis not present

## 2022-08-31 DIAGNOSIS — E1151 Type 2 diabetes mellitus with diabetic peripheral angiopathy without gangrene: Secondary | ICD-10-CM | POA: Diagnosis not present

## 2022-08-31 DIAGNOSIS — I130 Hypertensive heart and chronic kidney disease with heart failure and stage 1 through stage 4 chronic kidney disease, or unspecified chronic kidney disease: Secondary | ICD-10-CM | POA: Diagnosis not present

## 2022-08-31 DIAGNOSIS — E1122 Type 2 diabetes mellitus with diabetic chronic kidney disease: Secondary | ICD-10-CM | POA: Diagnosis not present

## 2022-08-31 DIAGNOSIS — L89610 Pressure ulcer of right heel, unstageable: Secondary | ICD-10-CM | POA: Diagnosis not present

## 2022-08-31 DIAGNOSIS — E1140 Type 2 diabetes mellitus with diabetic neuropathy, unspecified: Secondary | ICD-10-CM | POA: Diagnosis not present

## 2022-08-31 DIAGNOSIS — I5030 Unspecified diastolic (congestive) heart failure: Secondary | ICD-10-CM | POA: Diagnosis not present

## 2022-09-01 DIAGNOSIS — E785 Hyperlipidemia, unspecified: Secondary | ICD-10-CM

## 2022-09-01 DIAGNOSIS — M109 Gout, unspecified: Secondary | ICD-10-CM

## 2022-09-01 DIAGNOSIS — M47812 Spondylosis without myelopathy or radiculopathy, cervical region: Secondary | ICD-10-CM

## 2022-09-01 DIAGNOSIS — Z7982 Long term (current) use of aspirin: Secondary | ICD-10-CM

## 2022-09-01 DIAGNOSIS — M797 Fibromyalgia: Secondary | ICD-10-CM

## 2022-09-01 DIAGNOSIS — E1140 Type 2 diabetes mellitus with diabetic neuropathy, unspecified: Secondary | ICD-10-CM | POA: Diagnosis not present

## 2022-09-01 DIAGNOSIS — G8929 Other chronic pain: Secondary | ICD-10-CM

## 2022-09-01 DIAGNOSIS — Z87891 Personal history of nicotine dependence: Secondary | ICD-10-CM

## 2022-09-01 DIAGNOSIS — Z951 Presence of aortocoronary bypass graft: Secondary | ICD-10-CM

## 2022-09-01 DIAGNOSIS — M48061 Spinal stenosis, lumbar region without neurogenic claudication: Secondary | ICD-10-CM

## 2022-09-01 DIAGNOSIS — L89610 Pressure ulcer of right heel, unstageable: Secondary | ICD-10-CM | POA: Diagnosis not present

## 2022-09-01 DIAGNOSIS — K219 Gastro-esophageal reflux disease without esophagitis: Secondary | ICD-10-CM

## 2022-09-01 DIAGNOSIS — N1831 Chronic kidney disease, stage 3a: Secondary | ICD-10-CM | POA: Diagnosis not present

## 2022-09-01 DIAGNOSIS — L719 Rosacea, unspecified: Secondary | ICD-10-CM

## 2022-09-01 DIAGNOSIS — E1151 Type 2 diabetes mellitus with diabetic peripheral angiopathy without gangrene: Secondary | ICD-10-CM | POA: Diagnosis not present

## 2022-09-01 DIAGNOSIS — G4700 Insomnia, unspecified: Secondary | ICD-10-CM

## 2022-09-01 DIAGNOSIS — Z87440 Personal history of urinary (tract) infections: Secondary | ICD-10-CM

## 2022-09-01 DIAGNOSIS — M21619 Bunion of unspecified foot: Secondary | ICD-10-CM

## 2022-09-01 DIAGNOSIS — M5430 Sciatica, unspecified side: Secondary | ICD-10-CM

## 2022-09-01 DIAGNOSIS — F01B2 Vascular dementia, moderate, with psychotic disturbance: Secondary | ICD-10-CM | POA: Diagnosis not present

## 2022-09-01 DIAGNOSIS — I2510 Atherosclerotic heart disease of native coronary artery without angina pectoris: Secondary | ICD-10-CM | POA: Diagnosis not present

## 2022-09-01 DIAGNOSIS — Z96653 Presence of artificial knee joint, bilateral: Secondary | ICD-10-CM

## 2022-09-01 DIAGNOSIS — Z791 Long term (current) use of non-steroidal anti-inflammatories (NSAID): Secondary | ICD-10-CM

## 2022-09-01 DIAGNOSIS — F32A Depression, unspecified: Secondary | ICD-10-CM | POA: Diagnosis not present

## 2022-09-01 DIAGNOSIS — M19012 Primary osteoarthritis, left shoulder: Secondary | ICD-10-CM

## 2022-09-01 DIAGNOSIS — F01B3 Vascular dementia, moderate, with mood disturbance: Secondary | ICD-10-CM | POA: Diagnosis not present

## 2022-09-01 DIAGNOSIS — M4726 Other spondylosis with radiculopathy, lumbar region: Secondary | ICD-10-CM

## 2022-09-01 DIAGNOSIS — M769 Unspecified enthesopathy, lower limb, excluding foot: Secondary | ICD-10-CM

## 2022-09-01 DIAGNOSIS — E1122 Type 2 diabetes mellitus with diabetic chronic kidney disease: Secondary | ICD-10-CM | POA: Diagnosis not present

## 2022-09-01 DIAGNOSIS — I130 Hypertensive heart and chronic kidney disease with heart failure and stage 1 through stage 4 chronic kidney disease, or unspecified chronic kidney disease: Secondary | ICD-10-CM | POA: Diagnosis not present

## 2022-09-01 DIAGNOSIS — I5030 Unspecified diastolic (congestive) heart failure: Secondary | ICD-10-CM | POA: Diagnosis not present

## 2022-09-01 DIAGNOSIS — D631 Anemia in chronic kidney disease: Secondary | ICD-10-CM | POA: Diagnosis not present

## 2022-09-02 DIAGNOSIS — E1122 Type 2 diabetes mellitus with diabetic chronic kidney disease: Secondary | ICD-10-CM | POA: Diagnosis not present

## 2022-09-02 DIAGNOSIS — I130 Hypertensive heart and chronic kidney disease with heart failure and stage 1 through stage 4 chronic kidney disease, or unspecified chronic kidney disease: Secondary | ICD-10-CM | POA: Diagnosis not present

## 2022-09-02 DIAGNOSIS — I5030 Unspecified diastolic (congestive) heart failure: Secondary | ICD-10-CM | POA: Diagnosis not present

## 2022-09-02 DIAGNOSIS — E1140 Type 2 diabetes mellitus with diabetic neuropathy, unspecified: Secondary | ICD-10-CM | POA: Diagnosis not present

## 2022-09-02 DIAGNOSIS — L89610 Pressure ulcer of right heel, unstageable: Secondary | ICD-10-CM | POA: Diagnosis not present

## 2022-09-02 DIAGNOSIS — E1151 Type 2 diabetes mellitus with diabetic peripheral angiopathy without gangrene: Secondary | ICD-10-CM | POA: Diagnosis not present

## 2022-09-04 DIAGNOSIS — E1122 Type 2 diabetes mellitus with diabetic chronic kidney disease: Secondary | ICD-10-CM | POA: Diagnosis not present

## 2022-09-04 DIAGNOSIS — I130 Hypertensive heart and chronic kidney disease with heart failure and stage 1 through stage 4 chronic kidney disease, or unspecified chronic kidney disease: Secondary | ICD-10-CM | POA: Diagnosis not present

## 2022-09-04 DIAGNOSIS — E1151 Type 2 diabetes mellitus with diabetic peripheral angiopathy without gangrene: Secondary | ICD-10-CM | POA: Diagnosis not present

## 2022-09-04 DIAGNOSIS — L89610 Pressure ulcer of right heel, unstageable: Secondary | ICD-10-CM | POA: Diagnosis not present

## 2022-09-04 DIAGNOSIS — E1140 Type 2 diabetes mellitus with diabetic neuropathy, unspecified: Secondary | ICD-10-CM | POA: Diagnosis not present

## 2022-09-04 DIAGNOSIS — I5030 Unspecified diastolic (congestive) heart failure: Secondary | ICD-10-CM | POA: Diagnosis not present

## 2022-09-07 DIAGNOSIS — I130 Hypertensive heart and chronic kidney disease with heart failure and stage 1 through stage 4 chronic kidney disease, or unspecified chronic kidney disease: Secondary | ICD-10-CM | POA: Diagnosis not present

## 2022-09-07 DIAGNOSIS — E1122 Type 2 diabetes mellitus with diabetic chronic kidney disease: Secondary | ICD-10-CM | POA: Diagnosis not present

## 2022-09-07 DIAGNOSIS — E1140 Type 2 diabetes mellitus with diabetic neuropathy, unspecified: Secondary | ICD-10-CM | POA: Diagnosis not present

## 2022-09-07 DIAGNOSIS — E1151 Type 2 diabetes mellitus with diabetic peripheral angiopathy without gangrene: Secondary | ICD-10-CM | POA: Diagnosis not present

## 2022-09-07 DIAGNOSIS — L89610 Pressure ulcer of right heel, unstageable: Secondary | ICD-10-CM | POA: Diagnosis not present

## 2022-09-07 DIAGNOSIS — I5030 Unspecified diastolic (congestive) heart failure: Secondary | ICD-10-CM | POA: Diagnosis not present

## 2022-09-09 DIAGNOSIS — E1140 Type 2 diabetes mellitus with diabetic neuropathy, unspecified: Secondary | ICD-10-CM | POA: Diagnosis not present

## 2022-09-09 DIAGNOSIS — E1122 Type 2 diabetes mellitus with diabetic chronic kidney disease: Secondary | ICD-10-CM | POA: Diagnosis not present

## 2022-09-09 DIAGNOSIS — E1151 Type 2 diabetes mellitus with diabetic peripheral angiopathy without gangrene: Secondary | ICD-10-CM | POA: Diagnosis not present

## 2022-09-09 DIAGNOSIS — I10 Essential (primary) hypertension: Secondary | ICD-10-CM | POA: Diagnosis not present

## 2022-09-09 DIAGNOSIS — I5030 Unspecified diastolic (congestive) heart failure: Secondary | ICD-10-CM | POA: Diagnosis not present

## 2022-09-09 DIAGNOSIS — I739 Peripheral vascular disease, unspecified: Secondary | ICD-10-CM | POA: Diagnosis not present

## 2022-09-09 DIAGNOSIS — L89610 Pressure ulcer of right heel, unstageable: Secondary | ICD-10-CM | POA: Diagnosis not present

## 2022-09-09 DIAGNOSIS — I130 Hypertensive heart and chronic kidney disease with heart failure and stage 1 through stage 4 chronic kidney disease, or unspecified chronic kidney disease: Secondary | ICD-10-CM | POA: Diagnosis not present

## 2022-09-09 DIAGNOSIS — F0390 Unspecified dementia without behavioral disturbance: Secondary | ICD-10-CM | POA: Diagnosis not present

## 2022-09-10 ENCOUNTER — Ambulatory Visit: Payer: Medicare Other | Admitting: Podiatry

## 2022-09-11 DIAGNOSIS — I130 Hypertensive heart and chronic kidney disease with heart failure and stage 1 through stage 4 chronic kidney disease, or unspecified chronic kidney disease: Secondary | ICD-10-CM | POA: Diagnosis not present

## 2022-09-11 DIAGNOSIS — E1151 Type 2 diabetes mellitus with diabetic peripheral angiopathy without gangrene: Secondary | ICD-10-CM | POA: Diagnosis not present

## 2022-09-11 DIAGNOSIS — E1140 Type 2 diabetes mellitus with diabetic neuropathy, unspecified: Secondary | ICD-10-CM | POA: Diagnosis not present

## 2022-09-11 DIAGNOSIS — E1122 Type 2 diabetes mellitus with diabetic chronic kidney disease: Secondary | ICD-10-CM | POA: Diagnosis not present

## 2022-09-11 DIAGNOSIS — L89610 Pressure ulcer of right heel, unstageable: Secondary | ICD-10-CM | POA: Diagnosis not present

## 2022-09-11 DIAGNOSIS — I5030 Unspecified diastolic (congestive) heart failure: Secondary | ICD-10-CM | POA: Diagnosis not present

## 2022-09-15 DIAGNOSIS — E1151 Type 2 diabetes mellitus with diabetic peripheral angiopathy without gangrene: Secondary | ICD-10-CM | POA: Diagnosis not present

## 2022-09-15 DIAGNOSIS — I5030 Unspecified diastolic (congestive) heart failure: Secondary | ICD-10-CM | POA: Diagnosis not present

## 2022-09-15 DIAGNOSIS — L89610 Pressure ulcer of right heel, unstageable: Secondary | ICD-10-CM | POA: Diagnosis not present

## 2022-09-15 DIAGNOSIS — E1122 Type 2 diabetes mellitus with diabetic chronic kidney disease: Secondary | ICD-10-CM | POA: Diagnosis not present

## 2022-09-15 DIAGNOSIS — E1140 Type 2 diabetes mellitus with diabetic neuropathy, unspecified: Secondary | ICD-10-CM | POA: Diagnosis not present

## 2022-09-15 DIAGNOSIS — I130 Hypertensive heart and chronic kidney disease with heart failure and stage 1 through stage 4 chronic kidney disease, or unspecified chronic kidney disease: Secondary | ICD-10-CM | POA: Diagnosis not present

## 2022-09-16 DIAGNOSIS — L89610 Pressure ulcer of right heel, unstageable: Secondary | ICD-10-CM | POA: Diagnosis not present

## 2022-09-16 DIAGNOSIS — R609 Edema, unspecified: Secondary | ICD-10-CM | POA: Diagnosis not present

## 2022-09-16 DIAGNOSIS — L89609 Pressure ulcer of unspecified heel, unspecified stage: Secondary | ICD-10-CM | POA: Diagnosis not present

## 2022-09-16 DIAGNOSIS — I10 Essential (primary) hypertension: Secondary | ICD-10-CM | POA: Diagnosis not present

## 2022-09-16 DIAGNOSIS — I5030 Unspecified diastolic (congestive) heart failure: Secondary | ICD-10-CM | POA: Diagnosis not present

## 2022-09-16 DIAGNOSIS — I739 Peripheral vascular disease, unspecified: Secondary | ICD-10-CM | POA: Diagnosis not present

## 2022-09-16 DIAGNOSIS — E1140 Type 2 diabetes mellitus with diabetic neuropathy, unspecified: Secondary | ICD-10-CM | POA: Diagnosis not present

## 2022-09-16 DIAGNOSIS — E1122 Type 2 diabetes mellitus with diabetic chronic kidney disease: Secondary | ICD-10-CM | POA: Diagnosis not present

## 2022-09-16 DIAGNOSIS — I130 Hypertensive heart and chronic kidney disease with heart failure and stage 1 through stage 4 chronic kidney disease, or unspecified chronic kidney disease: Secondary | ICD-10-CM | POA: Diagnosis not present

## 2022-09-16 DIAGNOSIS — E1151 Type 2 diabetes mellitus with diabetic peripheral angiopathy without gangrene: Secondary | ICD-10-CM | POA: Diagnosis not present

## 2022-09-18 DIAGNOSIS — E1151 Type 2 diabetes mellitus with diabetic peripheral angiopathy without gangrene: Secondary | ICD-10-CM | POA: Diagnosis not present

## 2022-09-18 DIAGNOSIS — I5030 Unspecified diastolic (congestive) heart failure: Secondary | ICD-10-CM | POA: Diagnosis not present

## 2022-09-18 DIAGNOSIS — I130 Hypertensive heart and chronic kidney disease with heart failure and stage 1 through stage 4 chronic kidney disease, or unspecified chronic kidney disease: Secondary | ICD-10-CM | POA: Diagnosis not present

## 2022-09-18 DIAGNOSIS — E1122 Type 2 diabetes mellitus with diabetic chronic kidney disease: Secondary | ICD-10-CM | POA: Diagnosis not present

## 2022-09-18 DIAGNOSIS — E1140 Type 2 diabetes mellitus with diabetic neuropathy, unspecified: Secondary | ICD-10-CM | POA: Diagnosis not present

## 2022-09-18 DIAGNOSIS — L89610 Pressure ulcer of right heel, unstageable: Secondary | ICD-10-CM | POA: Diagnosis not present

## 2022-09-21 DIAGNOSIS — I5030 Unspecified diastolic (congestive) heart failure: Secondary | ICD-10-CM | POA: Diagnosis not present

## 2022-09-21 DIAGNOSIS — L89610 Pressure ulcer of right heel, unstageable: Secondary | ICD-10-CM | POA: Diagnosis not present

## 2022-09-21 DIAGNOSIS — I130 Hypertensive heart and chronic kidney disease with heart failure and stage 1 through stage 4 chronic kidney disease, or unspecified chronic kidney disease: Secondary | ICD-10-CM | POA: Diagnosis not present

## 2022-09-21 DIAGNOSIS — E1151 Type 2 diabetes mellitus with diabetic peripheral angiopathy without gangrene: Secondary | ICD-10-CM | POA: Diagnosis not present

## 2022-09-21 DIAGNOSIS — E1122 Type 2 diabetes mellitus with diabetic chronic kidney disease: Secondary | ICD-10-CM | POA: Diagnosis not present

## 2022-09-21 DIAGNOSIS — E1140 Type 2 diabetes mellitus with diabetic neuropathy, unspecified: Secondary | ICD-10-CM | POA: Diagnosis not present

## 2022-09-23 DIAGNOSIS — I130 Hypertensive heart and chronic kidney disease with heart failure and stage 1 through stage 4 chronic kidney disease, or unspecified chronic kidney disease: Secondary | ICD-10-CM | POA: Diagnosis not present

## 2022-09-23 DIAGNOSIS — I5030 Unspecified diastolic (congestive) heart failure: Secondary | ICD-10-CM | POA: Diagnosis not present

## 2022-09-23 DIAGNOSIS — L89610 Pressure ulcer of right heel, unstageable: Secondary | ICD-10-CM | POA: Diagnosis not present

## 2022-09-23 DIAGNOSIS — E1140 Type 2 diabetes mellitus with diabetic neuropathy, unspecified: Secondary | ICD-10-CM | POA: Diagnosis not present

## 2022-09-23 DIAGNOSIS — E1151 Type 2 diabetes mellitus with diabetic peripheral angiopathy without gangrene: Secondary | ICD-10-CM | POA: Diagnosis not present

## 2022-09-23 DIAGNOSIS — E1122 Type 2 diabetes mellitus with diabetic chronic kidney disease: Secondary | ICD-10-CM | POA: Diagnosis not present

## 2022-09-24 DIAGNOSIS — E1122 Type 2 diabetes mellitus with diabetic chronic kidney disease: Secondary | ICD-10-CM | POA: Diagnosis not present

## 2022-09-24 DIAGNOSIS — I5030 Unspecified diastolic (congestive) heart failure: Secondary | ICD-10-CM | POA: Diagnosis not present

## 2022-09-24 DIAGNOSIS — L89610 Pressure ulcer of right heel, unstageable: Secondary | ICD-10-CM | POA: Diagnosis not present

## 2022-09-24 DIAGNOSIS — I130 Hypertensive heart and chronic kidney disease with heart failure and stage 1 through stage 4 chronic kidney disease, or unspecified chronic kidney disease: Secondary | ICD-10-CM | POA: Diagnosis not present

## 2022-09-24 DIAGNOSIS — E1151 Type 2 diabetes mellitus with diabetic peripheral angiopathy without gangrene: Secondary | ICD-10-CM | POA: Diagnosis not present

## 2022-09-24 DIAGNOSIS — E1140 Type 2 diabetes mellitus with diabetic neuropathy, unspecified: Secondary | ICD-10-CM | POA: Diagnosis not present

## 2022-09-28 DIAGNOSIS — E1140 Type 2 diabetes mellitus with diabetic neuropathy, unspecified: Secondary | ICD-10-CM | POA: Diagnosis not present

## 2022-09-28 DIAGNOSIS — Z951 Presence of aortocoronary bypass graft: Secondary | ICD-10-CM | POA: Diagnosis not present

## 2022-09-28 DIAGNOSIS — M797 Fibromyalgia: Secondary | ICD-10-CM | POA: Diagnosis not present

## 2022-09-28 DIAGNOSIS — Z87440 Personal history of urinary (tract) infections: Secondary | ICD-10-CM | POA: Diagnosis not present

## 2022-09-28 DIAGNOSIS — M5430 Sciatica, unspecified side: Secondary | ICD-10-CM | POA: Diagnosis not present

## 2022-09-28 DIAGNOSIS — E1122 Type 2 diabetes mellitus with diabetic chronic kidney disease: Secondary | ICD-10-CM | POA: Diagnosis not present

## 2022-09-28 DIAGNOSIS — M4726 Other spondylosis with radiculopathy, lumbar region: Secondary | ICD-10-CM | POA: Diagnosis not present

## 2022-09-28 DIAGNOSIS — F01B2 Vascular dementia, moderate, with psychotic disturbance: Secondary | ICD-10-CM | POA: Diagnosis not present

## 2022-09-28 DIAGNOSIS — M21619 Bunion of unspecified foot: Secondary | ICD-10-CM | POA: Diagnosis not present

## 2022-09-28 DIAGNOSIS — N1831 Chronic kidney disease, stage 3a: Secondary | ICD-10-CM | POA: Diagnosis not present

## 2022-09-28 DIAGNOSIS — D631 Anemia in chronic kidney disease: Secondary | ICD-10-CM | POA: Diagnosis not present

## 2022-09-28 DIAGNOSIS — K219 Gastro-esophageal reflux disease without esophagitis: Secondary | ICD-10-CM | POA: Diagnosis not present

## 2022-09-28 DIAGNOSIS — M19012 Primary osteoarthritis, left shoulder: Secondary | ICD-10-CM | POA: Diagnosis not present

## 2022-09-28 DIAGNOSIS — I130 Hypertensive heart and chronic kidney disease with heart failure and stage 1 through stage 4 chronic kidney disease, or unspecified chronic kidney disease: Secondary | ICD-10-CM | POA: Diagnosis not present

## 2022-09-28 DIAGNOSIS — M109 Gout, unspecified: Secondary | ICD-10-CM | POA: Diagnosis not present

## 2022-09-28 DIAGNOSIS — E785 Hyperlipidemia, unspecified: Secondary | ICD-10-CM | POA: Diagnosis not present

## 2022-09-28 DIAGNOSIS — E1151 Type 2 diabetes mellitus with diabetic peripheral angiopathy without gangrene: Secondary | ICD-10-CM | POA: Diagnosis not present

## 2022-09-28 DIAGNOSIS — I5030 Unspecified diastolic (congestive) heart failure: Secondary | ICD-10-CM | POA: Diagnosis not present

## 2022-09-28 DIAGNOSIS — L89610 Pressure ulcer of right heel, unstageable: Secondary | ICD-10-CM | POA: Diagnosis not present

## 2022-09-28 DIAGNOSIS — F32A Depression, unspecified: Secondary | ICD-10-CM | POA: Diagnosis not present

## 2022-09-28 DIAGNOSIS — G4700 Insomnia, unspecified: Secondary | ICD-10-CM | POA: Diagnosis not present

## 2022-09-28 DIAGNOSIS — F01B3 Vascular dementia, moderate, with mood disturbance: Secondary | ICD-10-CM | POA: Diagnosis not present

## 2022-09-28 DIAGNOSIS — M48061 Spinal stenosis, lumbar region without neurogenic claudication: Secondary | ICD-10-CM | POA: Diagnosis not present

## 2022-09-28 DIAGNOSIS — I2510 Atherosclerotic heart disease of native coronary artery without angina pectoris: Secondary | ICD-10-CM | POA: Diagnosis not present

## 2022-09-28 DIAGNOSIS — M769 Unspecified enthesopathy, lower limb, excluding foot: Secondary | ICD-10-CM | POA: Diagnosis not present

## 2022-09-29 DIAGNOSIS — M25551 Pain in right hip: Secondary | ICD-10-CM | POA: Diagnosis not present

## 2022-09-30 DIAGNOSIS — I5030 Unspecified diastolic (congestive) heart failure: Secondary | ICD-10-CM | POA: Diagnosis not present

## 2022-09-30 DIAGNOSIS — I130 Hypertensive heart and chronic kidney disease with heart failure and stage 1 through stage 4 chronic kidney disease, or unspecified chronic kidney disease: Secondary | ICD-10-CM | POA: Diagnosis not present

## 2022-09-30 DIAGNOSIS — E1140 Type 2 diabetes mellitus with diabetic neuropathy, unspecified: Secondary | ICD-10-CM | POA: Diagnosis not present

## 2022-09-30 DIAGNOSIS — E1122 Type 2 diabetes mellitus with diabetic chronic kidney disease: Secondary | ICD-10-CM | POA: Diagnosis not present

## 2022-09-30 DIAGNOSIS — E1151 Type 2 diabetes mellitus with diabetic peripheral angiopathy without gangrene: Secondary | ICD-10-CM | POA: Diagnosis not present

## 2022-09-30 DIAGNOSIS — L89610 Pressure ulcer of right heel, unstageable: Secondary | ICD-10-CM | POA: Diagnosis not present

## 2022-10-02 DIAGNOSIS — I130 Hypertensive heart and chronic kidney disease with heart failure and stage 1 through stage 4 chronic kidney disease, or unspecified chronic kidney disease: Secondary | ICD-10-CM | POA: Diagnosis not present

## 2022-10-02 DIAGNOSIS — E1140 Type 2 diabetes mellitus with diabetic neuropathy, unspecified: Secondary | ICD-10-CM | POA: Diagnosis not present

## 2022-10-02 DIAGNOSIS — I5030 Unspecified diastolic (congestive) heart failure: Secondary | ICD-10-CM | POA: Diagnosis not present

## 2022-10-02 DIAGNOSIS — L89610 Pressure ulcer of right heel, unstageable: Secondary | ICD-10-CM | POA: Diagnosis not present

## 2022-10-02 DIAGNOSIS — E1151 Type 2 diabetes mellitus with diabetic peripheral angiopathy without gangrene: Secondary | ICD-10-CM | POA: Diagnosis not present

## 2022-10-02 DIAGNOSIS — E1122 Type 2 diabetes mellitus with diabetic chronic kidney disease: Secondary | ICD-10-CM | POA: Diagnosis not present

## 2022-10-05 DIAGNOSIS — L89610 Pressure ulcer of right heel, unstageable: Secondary | ICD-10-CM | POA: Diagnosis not present

## 2022-10-05 DIAGNOSIS — I5030 Unspecified diastolic (congestive) heart failure: Secondary | ICD-10-CM | POA: Diagnosis not present

## 2022-10-05 DIAGNOSIS — E1140 Type 2 diabetes mellitus with diabetic neuropathy, unspecified: Secondary | ICD-10-CM | POA: Diagnosis not present

## 2022-10-05 DIAGNOSIS — E1151 Type 2 diabetes mellitus with diabetic peripheral angiopathy without gangrene: Secondary | ICD-10-CM | POA: Diagnosis not present

## 2022-10-05 DIAGNOSIS — E1122 Type 2 diabetes mellitus with diabetic chronic kidney disease: Secondary | ICD-10-CM | POA: Diagnosis not present

## 2022-10-05 DIAGNOSIS — I130 Hypertensive heart and chronic kidney disease with heart failure and stage 1 through stage 4 chronic kidney disease, or unspecified chronic kidney disease: Secondary | ICD-10-CM | POA: Diagnosis not present

## 2022-10-07 DIAGNOSIS — E1151 Type 2 diabetes mellitus with diabetic peripheral angiopathy without gangrene: Secondary | ICD-10-CM | POA: Diagnosis not present

## 2022-10-07 DIAGNOSIS — L89610 Pressure ulcer of right heel, unstageable: Secondary | ICD-10-CM | POA: Diagnosis not present

## 2022-10-07 DIAGNOSIS — E1122 Type 2 diabetes mellitus with diabetic chronic kidney disease: Secondary | ICD-10-CM | POA: Diagnosis not present

## 2022-10-07 DIAGNOSIS — I130 Hypertensive heart and chronic kidney disease with heart failure and stage 1 through stage 4 chronic kidney disease, or unspecified chronic kidney disease: Secondary | ICD-10-CM | POA: Diagnosis not present

## 2022-10-07 DIAGNOSIS — B950 Streptococcus, group A, as the cause of diseases classified elsewhere: Secondary | ICD-10-CM | POA: Diagnosis not present

## 2022-10-07 DIAGNOSIS — E1140 Type 2 diabetes mellitus with diabetic neuropathy, unspecified: Secondary | ICD-10-CM | POA: Diagnosis not present

## 2022-10-07 DIAGNOSIS — I5030 Unspecified diastolic (congestive) heart failure: Secondary | ICD-10-CM | POA: Diagnosis not present

## 2022-10-08 ENCOUNTER — Ambulatory Visit (INDEPENDENT_AMBULATORY_CARE_PROVIDER_SITE_OTHER): Payer: Medicare Other | Admitting: Podiatry

## 2022-10-08 DIAGNOSIS — L97412 Non-pressure chronic ulcer of right heel and midfoot with fat layer exposed: Secondary | ICD-10-CM

## 2022-10-10 NOTE — Progress Notes (Signed)
Subjective: Chief Complaint  Patient presents with   Foot Ulcer    Heel ulcer, right, with fat layer exposed     89 year old female presents For above concerns with her daughter.  States that the wound is healed.  There is denies any drainage or pus or increasing swelling or redness.  Objective: AAO x3, NAD Pulses decreased but the foot is warm and perfused. Ulceration noted to the right lateral heel.  Wound is doing much better.  There is almost like a superficial skin fissure type lesion noted but there is no drainage or pus.  No probing to bone or tunneling.  There is no malodor. No pain with calf compression, swelling, warmth, erythema   Assessment: 89 year old female with heel ulceration right foot  Plan: -All treatment options discussed with the patient including all alternatives, risks, complications.  -Patient wound is doing substantially better and appears to have mostly healed.  I do expect the last little bit to continue to completely close.  Also keep a bandage on this during the day to also help decrease the pressure to the area.  She can continue with her regular shoe as tolerated as she is already doing this and the wound is healing.  Monitor for signs or symptoms of infection.  There is any change in the meantime to let me know.  Return in about 6 weeks (around 11/19/2022) for pre-ulcerative callus right lateral foot.  Vivi Barrack DPM

## 2022-10-11 ENCOUNTER — Other Ambulatory Visit: Payer: Self-pay | Admitting: Internal Medicine

## 2022-10-13 DIAGNOSIS — E1140 Type 2 diabetes mellitus with diabetic neuropathy, unspecified: Secondary | ICD-10-CM | POA: Diagnosis not present

## 2022-10-13 DIAGNOSIS — I130 Hypertensive heart and chronic kidney disease with heart failure and stage 1 through stage 4 chronic kidney disease, or unspecified chronic kidney disease: Secondary | ICD-10-CM | POA: Diagnosis not present

## 2022-10-13 DIAGNOSIS — E1151 Type 2 diabetes mellitus with diabetic peripheral angiopathy without gangrene: Secondary | ICD-10-CM | POA: Diagnosis not present

## 2022-10-13 DIAGNOSIS — E1122 Type 2 diabetes mellitus with diabetic chronic kidney disease: Secondary | ICD-10-CM | POA: Diagnosis not present

## 2022-10-13 DIAGNOSIS — I5030 Unspecified diastolic (congestive) heart failure: Secondary | ICD-10-CM | POA: Diagnosis not present

## 2022-10-13 DIAGNOSIS — L89610 Pressure ulcer of right heel, unstageable: Secondary | ICD-10-CM | POA: Diagnosis not present

## 2022-10-14 DIAGNOSIS — I10 Essential (primary) hypertension: Secondary | ICD-10-CM | POA: Diagnosis not present

## 2022-10-14 DIAGNOSIS — I739 Peripheral vascular disease, unspecified: Secondary | ICD-10-CM | POA: Diagnosis not present

## 2022-10-14 DIAGNOSIS — L89609 Pressure ulcer of unspecified heel, unspecified stage: Secondary | ICD-10-CM | POA: Diagnosis not present

## 2022-10-20 DIAGNOSIS — L89610 Pressure ulcer of right heel, unstageable: Secondary | ICD-10-CM | POA: Diagnosis not present

## 2022-10-20 DIAGNOSIS — E1140 Type 2 diabetes mellitus with diabetic neuropathy, unspecified: Secondary | ICD-10-CM | POA: Diagnosis not present

## 2022-10-20 DIAGNOSIS — E1151 Type 2 diabetes mellitus with diabetic peripheral angiopathy without gangrene: Secondary | ICD-10-CM | POA: Diagnosis not present

## 2022-10-20 DIAGNOSIS — E1122 Type 2 diabetes mellitus with diabetic chronic kidney disease: Secondary | ICD-10-CM | POA: Diagnosis not present

## 2022-10-20 DIAGNOSIS — I130 Hypertensive heart and chronic kidney disease with heart failure and stage 1 through stage 4 chronic kidney disease, or unspecified chronic kidney disease: Secondary | ICD-10-CM | POA: Diagnosis not present

## 2022-10-20 DIAGNOSIS — I5030 Unspecified diastolic (congestive) heart failure: Secondary | ICD-10-CM | POA: Diagnosis not present

## 2022-10-27 DIAGNOSIS — I7091 Generalized atherosclerosis: Secondary | ICD-10-CM | POA: Diagnosis not present

## 2022-10-27 DIAGNOSIS — B351 Tinea unguium: Secondary | ICD-10-CM | POA: Diagnosis not present

## 2022-11-05 DIAGNOSIS — R7303 Prediabetes: Secondary | ICD-10-CM | POA: Diagnosis not present

## 2022-11-06 DIAGNOSIS — N390 Urinary tract infection, site not specified: Secondary | ICD-10-CM | POA: Diagnosis not present

## 2022-11-11 DIAGNOSIS — N390 Urinary tract infection, site not specified: Secondary | ICD-10-CM | POA: Diagnosis not present

## 2022-11-11 DIAGNOSIS — R4182 Altered mental status, unspecified: Secondary | ICD-10-CM | POA: Diagnosis not present

## 2022-11-11 DIAGNOSIS — R7303 Prediabetes: Secondary | ICD-10-CM | POA: Diagnosis not present

## 2022-11-18 DIAGNOSIS — I739 Peripheral vascular disease, unspecified: Secondary | ICD-10-CM | POA: Diagnosis not present

## 2022-11-18 DIAGNOSIS — L89609 Pressure ulcer of unspecified heel, unspecified stage: Secondary | ICD-10-CM | POA: Diagnosis not present

## 2022-11-18 DIAGNOSIS — R609 Edema, unspecified: Secondary | ICD-10-CM | POA: Diagnosis not present

## 2022-11-18 DIAGNOSIS — R7303 Prediabetes: Secondary | ICD-10-CM | POA: Diagnosis not present

## 2022-11-19 ENCOUNTER — Ambulatory Visit (INDEPENDENT_AMBULATORY_CARE_PROVIDER_SITE_OTHER): Payer: Medicare Other | Admitting: Podiatry

## 2022-11-19 DIAGNOSIS — M797 Fibromyalgia: Secondary | ICD-10-CM | POA: Diagnosis not present

## 2022-11-19 DIAGNOSIS — E1151 Type 2 diabetes mellitus with diabetic peripheral angiopathy without gangrene: Secondary | ICD-10-CM | POA: Diagnosis not present

## 2022-11-19 DIAGNOSIS — K219 Gastro-esophageal reflux disease without esophagitis: Secondary | ICD-10-CM | POA: Diagnosis not present

## 2022-11-19 DIAGNOSIS — F32A Depression, unspecified: Secondary | ICD-10-CM | POA: Diagnosis not present

## 2022-11-19 DIAGNOSIS — L97412 Non-pressure chronic ulcer of right heel and midfoot with fat layer exposed: Secondary | ICD-10-CM | POA: Diagnosis not present

## 2022-11-19 DIAGNOSIS — Z791 Long term (current) use of non-steroidal anti-inflammatories (NSAID): Secondary | ICD-10-CM | POA: Diagnosis not present

## 2022-11-19 DIAGNOSIS — Z87440 Personal history of urinary (tract) infections: Secondary | ICD-10-CM | POA: Diagnosis not present

## 2022-11-19 DIAGNOSIS — M1990 Unspecified osteoarthritis, unspecified site: Secondary | ICD-10-CM | POA: Diagnosis not present

## 2022-11-19 DIAGNOSIS — L89613 Pressure ulcer of right heel, stage 3: Secondary | ICD-10-CM | POA: Diagnosis not present

## 2022-11-19 DIAGNOSIS — Z96619 Presence of unspecified artificial shoulder joint: Secondary | ICD-10-CM | POA: Diagnosis not present

## 2022-11-19 DIAGNOSIS — Z7982 Long term (current) use of aspirin: Secondary | ICD-10-CM | POA: Diagnosis not present

## 2022-11-19 DIAGNOSIS — Z556 Problems related to health literacy: Secondary | ICD-10-CM | POA: Diagnosis not present

## 2022-11-19 DIAGNOSIS — F03918 Unspecified dementia, unspecified severity, with other behavioral disturbance: Secondary | ICD-10-CM | POA: Diagnosis not present

## 2022-11-19 DIAGNOSIS — M479 Spondylosis, unspecified: Secondary | ICD-10-CM | POA: Diagnosis not present

## 2022-11-19 DIAGNOSIS — G4700 Insomnia, unspecified: Secondary | ICD-10-CM | POA: Diagnosis not present

## 2022-11-19 DIAGNOSIS — E785 Hyperlipidemia, unspecified: Secondary | ICD-10-CM | POA: Diagnosis not present

## 2022-11-19 DIAGNOSIS — M109 Gout, unspecified: Secondary | ICD-10-CM | POA: Diagnosis not present

## 2022-11-19 DIAGNOSIS — E559 Vitamin D deficiency, unspecified: Secondary | ICD-10-CM | POA: Diagnosis not present

## 2022-11-19 DIAGNOSIS — I10 Essential (primary) hypertension: Secondary | ICD-10-CM | POA: Diagnosis not present

## 2022-11-19 DIAGNOSIS — Z951 Presence of aortocoronary bypass graft: Secondary | ICD-10-CM | POA: Diagnosis not present

## 2022-11-19 DIAGNOSIS — Z79891 Long term (current) use of opiate analgesic: Secondary | ICD-10-CM | POA: Diagnosis not present

## 2022-11-19 DIAGNOSIS — Z96653 Presence of artificial knee joint, bilateral: Secondary | ICD-10-CM | POA: Diagnosis not present

## 2022-11-19 DIAGNOSIS — I2510 Atherosclerotic heart disease of native coronary artery without angina pectoris: Secondary | ICD-10-CM | POA: Diagnosis not present

## 2022-11-19 DIAGNOSIS — F0393 Unspecified dementia, unspecified severity, with mood disturbance: Secondary | ICD-10-CM | POA: Diagnosis not present

## 2022-11-19 DIAGNOSIS — K449 Diaphragmatic hernia without obstruction or gangrene: Secondary | ICD-10-CM | POA: Diagnosis not present

## 2022-11-19 DIAGNOSIS — G8929 Other chronic pain: Secondary | ICD-10-CM | POA: Diagnosis not present

## 2022-11-19 NOTE — Progress Notes (Signed)
Subjective: Chief Complaint  Patient presents with   Foot Problem    RIGHT HEEL OPEN, IS HAVING DRAINAGE, DENIES N/V/F/C. IS AT A NURSING HOME, STARTED IN [facility] WITH AN INFECTION.    89 year old female presents For above concerns with her daughter.  States the wound is opened back up some.  They have been applying a dressing.  No fevers or chills.  Objective: AAO x3, NAD Pulses decreased but the foot is warm and perfused. Ulceration noted to the right lateral heel.  Lateral aspect is pictured below there is a hyperkeratotic and central ulceration present.  Prior debridement there was epidermolysis, macerated tissue.  There is no probing to bone, no tunneling.  There is no fluctuation, crepitation or malodor.  No pain with calf compression, swelling, warmth, erythema     Assessment: 89 year old female with heel ulceration right foot  Plan: -All treatment options discussed with the patient including all alternatives, risks, complications.  -Separately the wound today without any complications without underlying ulceration.  Recommend continue with silver alginate dressing daily.  Offloading.  I debrided the wound with a 15 blade scalpel as well as a tissue nipper.  No bleeding. -Monitor for any clinical signs or symptoms of infection and directed to call the office immediately should any occur or go to the ER.  Return in about 3 weeks (around 12/10/2022) for heel ulcer, x-ray.  Vivi Barrack DPM

## 2022-11-24 DIAGNOSIS — I2510 Atherosclerotic heart disease of native coronary artery without angina pectoris: Secondary | ICD-10-CM | POA: Diagnosis not present

## 2022-11-24 DIAGNOSIS — F32A Depression, unspecified: Secondary | ICD-10-CM | POA: Diagnosis not present

## 2022-11-24 DIAGNOSIS — I10 Essential (primary) hypertension: Secondary | ICD-10-CM | POA: Diagnosis not present

## 2022-11-24 DIAGNOSIS — L89613 Pressure ulcer of right heel, stage 3: Secondary | ICD-10-CM | POA: Diagnosis not present

## 2022-11-24 DIAGNOSIS — E1151 Type 2 diabetes mellitus with diabetic peripheral angiopathy without gangrene: Secondary | ICD-10-CM | POA: Diagnosis not present

## 2022-11-24 DIAGNOSIS — F0393 Unspecified dementia, unspecified severity, with mood disturbance: Secondary | ICD-10-CM | POA: Diagnosis not present

## 2022-11-25 ENCOUNTER — Encounter: Payer: Self-pay | Admitting: Podiatry

## 2022-11-25 DIAGNOSIS — F0390 Unspecified dementia without behavioral disturbance: Secondary | ICD-10-CM | POA: Diagnosis not present

## 2022-11-25 DIAGNOSIS — R451 Restlessness and agitation: Secondary | ICD-10-CM | POA: Diagnosis not present

## 2022-11-25 DIAGNOSIS — I739 Peripheral vascular disease, unspecified: Secondary | ICD-10-CM | POA: Diagnosis not present

## 2022-11-25 DIAGNOSIS — R7303 Prediabetes: Secondary | ICD-10-CM | POA: Diagnosis not present

## 2022-11-25 DIAGNOSIS — R609 Edema, unspecified: Secondary | ICD-10-CM | POA: Diagnosis not present

## 2022-11-25 DIAGNOSIS — L89609 Pressure ulcer of unspecified heel, unspecified stage: Secondary | ICD-10-CM | POA: Diagnosis not present

## 2022-11-26 ENCOUNTER — Other Ambulatory Visit: Payer: Self-pay | Admitting: Podiatry

## 2022-11-27 DIAGNOSIS — F32A Depression, unspecified: Secondary | ICD-10-CM | POA: Diagnosis not present

## 2022-11-27 DIAGNOSIS — L89613 Pressure ulcer of right heel, stage 3: Secondary | ICD-10-CM | POA: Diagnosis not present

## 2022-11-27 DIAGNOSIS — I2510 Atherosclerotic heart disease of native coronary artery without angina pectoris: Secondary | ICD-10-CM | POA: Diagnosis not present

## 2022-11-27 DIAGNOSIS — E1151 Type 2 diabetes mellitus with diabetic peripheral angiopathy without gangrene: Secondary | ICD-10-CM | POA: Diagnosis not present

## 2022-11-27 DIAGNOSIS — I10 Essential (primary) hypertension: Secondary | ICD-10-CM | POA: Diagnosis not present

## 2022-11-27 DIAGNOSIS — F0393 Unspecified dementia, unspecified severity, with mood disturbance: Secondary | ICD-10-CM | POA: Diagnosis not present

## 2022-12-01 DIAGNOSIS — F32A Depression, unspecified: Secondary | ICD-10-CM | POA: Diagnosis not present

## 2022-12-01 DIAGNOSIS — F0393 Unspecified dementia, unspecified severity, with mood disturbance: Secondary | ICD-10-CM | POA: Diagnosis not present

## 2022-12-01 DIAGNOSIS — I10 Essential (primary) hypertension: Secondary | ICD-10-CM | POA: Diagnosis not present

## 2022-12-01 DIAGNOSIS — I2510 Atherosclerotic heart disease of native coronary artery without angina pectoris: Secondary | ICD-10-CM | POA: Diagnosis not present

## 2022-12-01 DIAGNOSIS — E1151 Type 2 diabetes mellitus with diabetic peripheral angiopathy without gangrene: Secondary | ICD-10-CM | POA: Diagnosis not present

## 2022-12-01 DIAGNOSIS — L89613 Pressure ulcer of right heel, stage 3: Secondary | ICD-10-CM | POA: Diagnosis not present

## 2022-12-04 DIAGNOSIS — I10 Essential (primary) hypertension: Secondary | ICD-10-CM | POA: Diagnosis not present

## 2022-12-04 DIAGNOSIS — L89613 Pressure ulcer of right heel, stage 3: Secondary | ICD-10-CM | POA: Diagnosis not present

## 2022-12-04 DIAGNOSIS — F32A Depression, unspecified: Secondary | ICD-10-CM | POA: Diagnosis not present

## 2022-12-04 DIAGNOSIS — E1151 Type 2 diabetes mellitus with diabetic peripheral angiopathy without gangrene: Secondary | ICD-10-CM | POA: Diagnosis not present

## 2022-12-04 DIAGNOSIS — F0393 Unspecified dementia, unspecified severity, with mood disturbance: Secondary | ICD-10-CM | POA: Diagnosis not present

## 2022-12-04 DIAGNOSIS — I2510 Atherosclerotic heart disease of native coronary artery without angina pectoris: Secondary | ICD-10-CM | POA: Diagnosis not present

## 2022-12-08 DIAGNOSIS — E1151 Type 2 diabetes mellitus with diabetic peripheral angiopathy without gangrene: Secondary | ICD-10-CM | POA: Diagnosis not present

## 2022-12-08 DIAGNOSIS — F0393 Unspecified dementia, unspecified severity, with mood disturbance: Secondary | ICD-10-CM | POA: Diagnosis not present

## 2022-12-08 DIAGNOSIS — L89613 Pressure ulcer of right heel, stage 3: Secondary | ICD-10-CM | POA: Diagnosis not present

## 2022-12-08 DIAGNOSIS — F32A Depression, unspecified: Secondary | ICD-10-CM | POA: Diagnosis not present

## 2022-12-08 DIAGNOSIS — I10 Essential (primary) hypertension: Secondary | ICD-10-CM | POA: Diagnosis not present

## 2022-12-08 DIAGNOSIS — I2510 Atherosclerotic heart disease of native coronary artery without angina pectoris: Secondary | ICD-10-CM | POA: Diagnosis not present

## 2022-12-09 DIAGNOSIS — F0390 Unspecified dementia without behavioral disturbance: Secondary | ICD-10-CM | POA: Diagnosis not present

## 2022-12-09 DIAGNOSIS — Z66 Do not resuscitate: Secondary | ICD-10-CM | POA: Diagnosis not present

## 2022-12-10 ENCOUNTER — Ambulatory Visit (INDEPENDENT_AMBULATORY_CARE_PROVIDER_SITE_OTHER): Payer: Medicare Other | Admitting: Podiatry

## 2022-12-10 ENCOUNTER — Ambulatory Visit (INDEPENDENT_AMBULATORY_CARE_PROVIDER_SITE_OTHER): Payer: Medicare Other

## 2022-12-10 DIAGNOSIS — L97412 Non-pressure chronic ulcer of right heel and midfoot with fat layer exposed: Secondary | ICD-10-CM

## 2022-12-11 DIAGNOSIS — I2510 Atherosclerotic heart disease of native coronary artery without angina pectoris: Secondary | ICD-10-CM | POA: Diagnosis not present

## 2022-12-11 DIAGNOSIS — F0393 Unspecified dementia, unspecified severity, with mood disturbance: Secondary | ICD-10-CM | POA: Diagnosis not present

## 2022-12-11 DIAGNOSIS — E1151 Type 2 diabetes mellitus with diabetic peripheral angiopathy without gangrene: Secondary | ICD-10-CM | POA: Diagnosis not present

## 2022-12-11 DIAGNOSIS — I10 Essential (primary) hypertension: Secondary | ICD-10-CM | POA: Diagnosis not present

## 2022-12-11 DIAGNOSIS — L89613 Pressure ulcer of right heel, stage 3: Secondary | ICD-10-CM | POA: Diagnosis not present

## 2022-12-11 DIAGNOSIS — F32A Depression, unspecified: Secondary | ICD-10-CM | POA: Diagnosis not present

## 2022-12-14 NOTE — Progress Notes (Signed)
Subjective: No chief complaint on file.   89 year old female presents with the above concerns with her daughter for further evaluation of the lateral aspect the right foot.  Nursing has been changing the bandage.  Does not endorse any fevers or chills.  Denies any drainage or pus.  Objective: AAO x3, NAD Pulses decreased but the foot is warm and perfused. Ulceration noted to the right lateral heel.  Appears to be more of dry skin, callus formation upon debridement appears the wound is healed but the area still preulcerative.  There is no skin breakdown otherwise.  There is no drainage or pus.  There is no fluctuation or crepitation.  There is no malodor. No pain with calf compression, swelling, warmth, erythema     Assessment: 89 year old female with heel ulceration right foot  Plan: -All treatment options discussed with the patient including all alternatives, risks, complications.  -X-rays were obtained reviewed.  3 views of the right foot were obtained.  Socket which does limit the exam somewhat.  There is no definitive cortical changes suggest osteomyelitis at this time.  There is no soft tissue edema. -Debrided hyperkeratotic tissue without any complications or bleeding.  Feels the wound is healed out at the continue daily dressing changes for more offloading purposes. -Vascular: Last seen on 08/13/2022- ulcer seems to be healing -Monitor for any clinical signs or symptoms of infection and directed to call the office immediately should any occur or go to the ER.  Return in about 4 weeks (around 01/07/2023) for heel ulcer.  Vivi Barrack DPM

## 2022-12-15 DIAGNOSIS — I2510 Atherosclerotic heart disease of native coronary artery without angina pectoris: Secondary | ICD-10-CM | POA: Diagnosis not present

## 2022-12-15 DIAGNOSIS — F0393 Unspecified dementia, unspecified severity, with mood disturbance: Secondary | ICD-10-CM | POA: Diagnosis not present

## 2022-12-15 DIAGNOSIS — E1151 Type 2 diabetes mellitus with diabetic peripheral angiopathy without gangrene: Secondary | ICD-10-CM | POA: Diagnosis not present

## 2022-12-15 DIAGNOSIS — I10 Essential (primary) hypertension: Secondary | ICD-10-CM | POA: Diagnosis not present

## 2022-12-15 DIAGNOSIS — F32A Depression, unspecified: Secondary | ICD-10-CM | POA: Diagnosis not present

## 2022-12-15 DIAGNOSIS — L89613 Pressure ulcer of right heel, stage 3: Secondary | ICD-10-CM | POA: Diagnosis not present

## 2022-12-18 DIAGNOSIS — I2510 Atherosclerotic heart disease of native coronary artery without angina pectoris: Secondary | ICD-10-CM | POA: Diagnosis not present

## 2022-12-18 DIAGNOSIS — F0393 Unspecified dementia, unspecified severity, with mood disturbance: Secondary | ICD-10-CM | POA: Diagnosis not present

## 2022-12-18 DIAGNOSIS — E1151 Type 2 diabetes mellitus with diabetic peripheral angiopathy without gangrene: Secondary | ICD-10-CM | POA: Diagnosis not present

## 2022-12-18 DIAGNOSIS — L89613 Pressure ulcer of right heel, stage 3: Secondary | ICD-10-CM | POA: Diagnosis not present

## 2022-12-18 DIAGNOSIS — F32A Depression, unspecified: Secondary | ICD-10-CM | POA: Diagnosis not present

## 2022-12-18 DIAGNOSIS — I10 Essential (primary) hypertension: Secondary | ICD-10-CM | POA: Diagnosis not present

## 2022-12-19 DIAGNOSIS — F32A Depression, unspecified: Secondary | ICD-10-CM | POA: Diagnosis not present

## 2022-12-19 DIAGNOSIS — Z791 Long term (current) use of non-steroidal anti-inflammatories (NSAID): Secondary | ICD-10-CM | POA: Diagnosis not present

## 2022-12-19 DIAGNOSIS — Z556 Problems related to health literacy: Secondary | ICD-10-CM | POA: Diagnosis not present

## 2022-12-19 DIAGNOSIS — F03918 Unspecified dementia, unspecified severity, with other behavioral disturbance: Secondary | ICD-10-CM | POA: Diagnosis not present

## 2022-12-19 DIAGNOSIS — K219 Gastro-esophageal reflux disease without esophagitis: Secondary | ICD-10-CM | POA: Diagnosis not present

## 2022-12-19 DIAGNOSIS — I2510 Atherosclerotic heart disease of native coronary artery without angina pectoris: Secondary | ICD-10-CM | POA: Diagnosis not present

## 2022-12-19 DIAGNOSIS — I10 Essential (primary) hypertension: Secondary | ICD-10-CM | POA: Diagnosis not present

## 2022-12-19 DIAGNOSIS — Z96619 Presence of unspecified artificial shoulder joint: Secondary | ICD-10-CM | POA: Diagnosis not present

## 2022-12-19 DIAGNOSIS — F0393 Unspecified dementia, unspecified severity, with mood disturbance: Secondary | ICD-10-CM | POA: Diagnosis not present

## 2022-12-19 DIAGNOSIS — E559 Vitamin D deficiency, unspecified: Secondary | ICD-10-CM | POA: Diagnosis not present

## 2022-12-19 DIAGNOSIS — G4700 Insomnia, unspecified: Secondary | ICD-10-CM | POA: Diagnosis not present

## 2022-12-19 DIAGNOSIS — M1990 Unspecified osteoarthritis, unspecified site: Secondary | ICD-10-CM | POA: Diagnosis not present

## 2022-12-19 DIAGNOSIS — E785 Hyperlipidemia, unspecified: Secondary | ICD-10-CM | POA: Diagnosis not present

## 2022-12-19 DIAGNOSIS — Z951 Presence of aortocoronary bypass graft: Secondary | ICD-10-CM | POA: Diagnosis not present

## 2022-12-19 DIAGNOSIS — Z7982 Long term (current) use of aspirin: Secondary | ICD-10-CM | POA: Diagnosis not present

## 2022-12-19 DIAGNOSIS — Z96653 Presence of artificial knee joint, bilateral: Secondary | ICD-10-CM | POA: Diagnosis not present

## 2022-12-19 DIAGNOSIS — M797 Fibromyalgia: Secondary | ICD-10-CM | POA: Diagnosis not present

## 2022-12-19 DIAGNOSIS — Z79891 Long term (current) use of opiate analgesic: Secondary | ICD-10-CM | POA: Diagnosis not present

## 2022-12-19 DIAGNOSIS — G8929 Other chronic pain: Secondary | ICD-10-CM | POA: Diagnosis not present

## 2022-12-19 DIAGNOSIS — Z87440 Personal history of urinary (tract) infections: Secondary | ICD-10-CM | POA: Diagnosis not present

## 2022-12-19 DIAGNOSIS — K449 Diaphragmatic hernia without obstruction or gangrene: Secondary | ICD-10-CM | POA: Diagnosis not present

## 2022-12-19 DIAGNOSIS — L89613 Pressure ulcer of right heel, stage 3: Secondary | ICD-10-CM | POA: Diagnosis not present

## 2022-12-19 DIAGNOSIS — M479 Spondylosis, unspecified: Secondary | ICD-10-CM | POA: Diagnosis not present

## 2022-12-19 DIAGNOSIS — E1151 Type 2 diabetes mellitus with diabetic peripheral angiopathy without gangrene: Secondary | ICD-10-CM | POA: Diagnosis not present

## 2022-12-19 DIAGNOSIS — M109 Gout, unspecified: Secondary | ICD-10-CM | POA: Diagnosis not present

## 2022-12-23 DIAGNOSIS — F32A Depression, unspecified: Secondary | ICD-10-CM | POA: Diagnosis not present

## 2022-12-23 DIAGNOSIS — R451 Restlessness and agitation: Secondary | ICD-10-CM | POA: Diagnosis not present

## 2022-12-23 DIAGNOSIS — L89613 Pressure ulcer of right heel, stage 3: Secondary | ICD-10-CM | POA: Diagnosis not present

## 2022-12-23 DIAGNOSIS — E1151 Type 2 diabetes mellitus with diabetic peripheral angiopathy without gangrene: Secondary | ICD-10-CM | POA: Diagnosis not present

## 2022-12-23 DIAGNOSIS — F0390 Unspecified dementia without behavioral disturbance: Secondary | ICD-10-CM | POA: Diagnosis not present

## 2022-12-23 DIAGNOSIS — I2510 Atherosclerotic heart disease of native coronary artery without angina pectoris: Secondary | ICD-10-CM | POA: Diagnosis not present

## 2022-12-23 DIAGNOSIS — F0393 Unspecified dementia, unspecified severity, with mood disturbance: Secondary | ICD-10-CM | POA: Diagnosis not present

## 2022-12-23 DIAGNOSIS — I10 Essential (primary) hypertension: Secondary | ICD-10-CM | POA: Diagnosis not present

## 2022-12-29 DIAGNOSIS — F0393 Unspecified dementia, unspecified severity, with mood disturbance: Secondary | ICD-10-CM | POA: Diagnosis not present

## 2022-12-29 DIAGNOSIS — I2510 Atherosclerotic heart disease of native coronary artery without angina pectoris: Secondary | ICD-10-CM | POA: Diagnosis not present

## 2022-12-29 DIAGNOSIS — I10 Essential (primary) hypertension: Secondary | ICD-10-CM | POA: Diagnosis not present

## 2022-12-29 DIAGNOSIS — L89613 Pressure ulcer of right heel, stage 3: Secondary | ICD-10-CM | POA: Diagnosis not present

## 2022-12-29 DIAGNOSIS — E1151 Type 2 diabetes mellitus with diabetic peripheral angiopathy without gangrene: Secondary | ICD-10-CM | POA: Diagnosis not present

## 2022-12-29 DIAGNOSIS — F32A Depression, unspecified: Secondary | ICD-10-CM | POA: Diagnosis not present

## 2023-01-04 DIAGNOSIS — N390 Urinary tract infection, site not specified: Secondary | ICD-10-CM | POA: Diagnosis not present

## 2023-01-05 DIAGNOSIS — F32A Depression, unspecified: Secondary | ICD-10-CM | POA: Diagnosis not present

## 2023-01-05 DIAGNOSIS — E1151 Type 2 diabetes mellitus with diabetic peripheral angiopathy without gangrene: Secondary | ICD-10-CM | POA: Diagnosis not present

## 2023-01-05 DIAGNOSIS — I10 Essential (primary) hypertension: Secondary | ICD-10-CM | POA: Diagnosis not present

## 2023-01-05 DIAGNOSIS — I2510 Atherosclerotic heart disease of native coronary artery without angina pectoris: Secondary | ICD-10-CM | POA: Diagnosis not present

## 2023-01-05 DIAGNOSIS — F0393 Unspecified dementia, unspecified severity, with mood disturbance: Secondary | ICD-10-CM | POA: Diagnosis not present

## 2023-01-05 DIAGNOSIS — L89613 Pressure ulcer of right heel, stage 3: Secondary | ICD-10-CM | POA: Diagnosis not present

## 2023-01-06 DIAGNOSIS — R4182 Altered mental status, unspecified: Secondary | ICD-10-CM | POA: Diagnosis not present

## 2023-01-06 DIAGNOSIS — R051 Acute cough: Secondary | ICD-10-CM | POA: Diagnosis not present

## 2023-01-06 DIAGNOSIS — N390 Urinary tract infection, site not specified: Secondary | ICD-10-CM | POA: Diagnosis not present

## 2023-01-06 DIAGNOSIS — R0981 Nasal congestion: Secondary | ICD-10-CM | POA: Diagnosis not present

## 2023-01-07 ENCOUNTER — Ambulatory Visit: Payer: Medicare Other | Admitting: Neurology

## 2023-01-12 DIAGNOSIS — Z23 Encounter for immunization: Secondary | ICD-10-CM | POA: Diagnosis not present

## 2023-01-13 DIAGNOSIS — M25512 Pain in left shoulder: Secondary | ICD-10-CM | POA: Diagnosis not present

## 2023-01-13 DIAGNOSIS — N390 Urinary tract infection, site not specified: Secondary | ICD-10-CM | POA: Diagnosis not present

## 2023-01-14 ENCOUNTER — Ambulatory Visit: Payer: Medicare Other | Admitting: Podiatry

## 2023-01-14 DIAGNOSIS — E1151 Type 2 diabetes mellitus with diabetic peripheral angiopathy without gangrene: Secondary | ICD-10-CM | POA: Diagnosis not present

## 2023-01-14 DIAGNOSIS — I2510 Atherosclerotic heart disease of native coronary artery without angina pectoris: Secondary | ICD-10-CM | POA: Diagnosis not present

## 2023-01-14 DIAGNOSIS — F32A Depression, unspecified: Secondary | ICD-10-CM | POA: Diagnosis not present

## 2023-01-14 DIAGNOSIS — F0393 Unspecified dementia, unspecified severity, with mood disturbance: Secondary | ICD-10-CM | POA: Diagnosis not present

## 2023-01-14 DIAGNOSIS — L89613 Pressure ulcer of right heel, stage 3: Secondary | ICD-10-CM | POA: Diagnosis not present

## 2023-01-14 DIAGNOSIS — I10 Essential (primary) hypertension: Secondary | ICD-10-CM | POA: Diagnosis not present

## 2023-01-16 DIAGNOSIS — B351 Tinea unguium: Secondary | ICD-10-CM | POA: Diagnosis not present

## 2023-01-16 DIAGNOSIS — I7091 Generalized atherosclerosis: Secondary | ICD-10-CM | POA: Diagnosis not present

## 2023-01-20 DIAGNOSIS — I10 Essential (primary) hypertension: Secondary | ICD-10-CM | POA: Diagnosis not present

## 2023-01-20 DIAGNOSIS — F0390 Unspecified dementia without behavioral disturbance: Secondary | ICD-10-CM | POA: Diagnosis not present

## 2023-01-20 DIAGNOSIS — T8062XA Other serum reaction due to vaccination, initial encounter: Secondary | ICD-10-CM | POA: Diagnosis not present

## 2023-01-20 DIAGNOSIS — G8918 Other acute postprocedural pain: Secondary | ICD-10-CM | POA: Diagnosis not present

## 2023-01-27 DIAGNOSIS — R051 Acute cough: Secondary | ICD-10-CM | POA: Diagnosis not present

## 2023-01-27 DIAGNOSIS — R609 Edema, unspecified: Secondary | ICD-10-CM | POA: Diagnosis not present

## 2023-01-27 DIAGNOSIS — M6281 Muscle weakness (generalized): Secondary | ICD-10-CM | POA: Diagnosis not present

## 2023-01-27 DIAGNOSIS — R0981 Nasal congestion: Secondary | ICD-10-CM | POA: Diagnosis not present

## 2023-01-28 DIAGNOSIS — R059 Cough, unspecified: Secondary | ICD-10-CM | POA: Diagnosis not present

## 2023-01-28 DIAGNOSIS — R0989 Other specified symptoms and signs involving the circulatory and respiratory systems: Secondary | ICD-10-CM | POA: Diagnosis not present

## 2023-01-29 ENCOUNTER — Other Ambulatory Visit: Payer: Self-pay | Admitting: Neurology

## 2023-01-29 DIAGNOSIS — F01B3 Vascular dementia, moderate, with mood disturbance: Secondary | ICD-10-CM

## 2023-02-03 DIAGNOSIS — R0981 Nasal congestion: Secondary | ICD-10-CM | POA: Diagnosis not present

## 2023-02-03 DIAGNOSIS — R051 Acute cough: Secondary | ICD-10-CM | POA: Diagnosis not present

## 2023-02-11 DIAGNOSIS — R609 Edema, unspecified: Secondary | ICD-10-CM | POA: Diagnosis not present

## 2023-02-11 DIAGNOSIS — E559 Vitamin D deficiency, unspecified: Secondary | ICD-10-CM | POA: Diagnosis not present

## 2023-02-11 DIAGNOSIS — R7303 Prediabetes: Secondary | ICD-10-CM | POA: Diagnosis not present

## 2023-02-11 DIAGNOSIS — I10 Essential (primary) hypertension: Secondary | ICD-10-CM | POA: Diagnosis not present

## 2023-02-17 DIAGNOSIS — R7303 Prediabetes: Secondary | ICD-10-CM | POA: Diagnosis not present

## 2023-02-17 DIAGNOSIS — I10 Essential (primary) hypertension: Secondary | ICD-10-CM | POA: Diagnosis not present

## 2023-02-17 DIAGNOSIS — R609 Edema, unspecified: Secondary | ICD-10-CM | POA: Diagnosis not present

## 2023-02-17 DIAGNOSIS — E559 Vitamin D deficiency, unspecified: Secondary | ICD-10-CM | POA: Diagnosis not present

## 2023-02-24 DIAGNOSIS — R4182 Altered mental status, unspecified: Secondary | ICD-10-CM | POA: Diagnosis not present

## 2023-02-24 DIAGNOSIS — R451 Restlessness and agitation: Secondary | ICD-10-CM | POA: Diagnosis not present

## 2023-02-24 DIAGNOSIS — F0390 Unspecified dementia without behavioral disturbance: Secondary | ICD-10-CM | POA: Diagnosis not present

## 2023-03-01 DIAGNOSIS — F0390 Unspecified dementia without behavioral disturbance: Secondary | ICD-10-CM | POA: Diagnosis not present

## 2023-03-01 DIAGNOSIS — I10 Essential (primary) hypertension: Secondary | ICD-10-CM | POA: Diagnosis not present

## 2023-03-04 DIAGNOSIS — E870 Hyperosmolality and hypernatremia: Secondary | ICD-10-CM | POA: Diagnosis not present

## 2023-03-05 DIAGNOSIS — F0390 Unspecified dementia without behavioral disturbance: Secondary | ICD-10-CM | POA: Diagnosis not present

## 2023-03-05 DIAGNOSIS — R451 Restlessness and agitation: Secondary | ICD-10-CM | POA: Diagnosis not present

## 2023-03-05 DIAGNOSIS — R4182 Altered mental status, unspecified: Secondary | ICD-10-CM | POA: Diagnosis not present

## 2023-03-05 DIAGNOSIS — R609 Edema, unspecified: Secondary | ICD-10-CM | POA: Diagnosis not present

## 2023-03-05 DIAGNOSIS — I10 Essential (primary) hypertension: Secondary | ICD-10-CM | POA: Diagnosis not present

## 2023-03-10 DIAGNOSIS — N390 Urinary tract infection, site not specified: Secondary | ICD-10-CM | POA: Diagnosis not present

## 2023-03-10 DIAGNOSIS — R4182 Altered mental status, unspecified: Secondary | ICD-10-CM | POA: Diagnosis not present

## 2023-04-07 DIAGNOSIS — R7303 Prediabetes: Secondary | ICD-10-CM | POA: Diagnosis not present

## 2023-04-07 DIAGNOSIS — R451 Restlessness and agitation: Secondary | ICD-10-CM | POA: Diagnosis not present

## 2023-04-07 DIAGNOSIS — I10 Essential (primary) hypertension: Secondary | ICD-10-CM | POA: Diagnosis not present

## 2023-04-07 DIAGNOSIS — R609 Edema, unspecified: Secondary | ICD-10-CM | POA: Diagnosis not present

## 2023-04-24 ENCOUNTER — Inpatient Hospital Stay (HOSPITAL_COMMUNITY): Payer: Medicare Other

## 2023-04-24 ENCOUNTER — Emergency Department (HOSPITAL_COMMUNITY): Payer: Medicare Other

## 2023-04-24 ENCOUNTER — Other Ambulatory Visit: Payer: Self-pay

## 2023-04-24 ENCOUNTER — Inpatient Hospital Stay (HOSPITAL_COMMUNITY)
Admission: EM | Admit: 2023-04-24 | Discharge: 2023-04-29 | DRG: 193 | Disposition: A | Discharge status: Home Health | Payer: Medicare Other | Source: Skilled Nursing Facility | Attending: Internal Medicine | Admitting: Internal Medicine

## 2023-04-24 ENCOUNTER — Encounter (HOSPITAL_COMMUNITY): Payer: Self-pay | Admitting: Internal Medicine

## 2023-04-24 DIAGNOSIS — M797 Fibromyalgia: Secondary | ICD-10-CM | POA: Diagnosis present

## 2023-04-24 DIAGNOSIS — G8929 Other chronic pain: Secondary | ICD-10-CM | POA: Diagnosis present

## 2023-04-24 DIAGNOSIS — Q249 Congenital malformation of heart, unspecified: Secondary | ICD-10-CM | POA: Diagnosis not present

## 2023-04-24 DIAGNOSIS — Z951 Presence of aortocoronary bypass graft: Secondary | ICD-10-CM | POA: Diagnosis not present

## 2023-04-24 DIAGNOSIS — R0602 Shortness of breath: Secondary | ICD-10-CM

## 2023-04-24 DIAGNOSIS — I5031 Acute diastolic (congestive) heart failure: Secondary | ICD-10-CM | POA: Diagnosis present

## 2023-04-24 DIAGNOSIS — I5023 Acute on chronic systolic (congestive) heart failure: Secondary | ICD-10-CM

## 2023-04-24 DIAGNOSIS — Z791 Long term (current) use of non-steroidal anti-inflammatories (NSAID): Secondary | ICD-10-CM

## 2023-04-24 DIAGNOSIS — F0393 Unspecified dementia, unspecified severity, with mood disturbance: Secondary | ICD-10-CM | POA: Diagnosis present

## 2023-04-24 DIAGNOSIS — E860 Dehydration: Secondary | ICD-10-CM | POA: Diagnosis present

## 2023-04-24 DIAGNOSIS — J121 Respiratory syncytial virus pneumonia: Principal | ICD-10-CM | POA: Diagnosis present

## 2023-04-24 DIAGNOSIS — E876 Hypokalemia: Secondary | ICD-10-CM | POA: Diagnosis present

## 2023-04-24 DIAGNOSIS — I110 Hypertensive heart disease with heart failure: Secondary | ICD-10-CM | POA: Diagnosis present

## 2023-04-24 DIAGNOSIS — Z8249 Family history of ischemic heart disease and other diseases of the circulatory system: Secondary | ICD-10-CM

## 2023-04-24 DIAGNOSIS — J9601 Acute respiratory failure with hypoxia: Principal | ICD-10-CM | POA: Diagnosis present

## 2023-04-24 DIAGNOSIS — Z7982 Long term (current) use of aspirin: Secondary | ICD-10-CM

## 2023-04-24 DIAGNOSIS — Z96653 Presence of artificial knee joint, bilateral: Secondary | ICD-10-CM | POA: Diagnosis present

## 2023-04-24 DIAGNOSIS — Z66 Do not resuscitate: Secondary | ICD-10-CM | POA: Diagnosis present

## 2023-04-24 DIAGNOSIS — K219 Gastro-esophageal reflux disease without esophagitis: Secondary | ICD-10-CM | POA: Diagnosis present

## 2023-04-24 DIAGNOSIS — F01B3 Vascular dementia, moderate, with mood disturbance: Secondary | ICD-10-CM

## 2023-04-24 DIAGNOSIS — Z96612 Presence of left artificial shoulder joint: Secondary | ICD-10-CM | POA: Diagnosis present

## 2023-04-24 DIAGNOSIS — Z800 Family history of malignant neoplasm of digestive organs: Secondary | ICD-10-CM

## 2023-04-24 DIAGNOSIS — N179 Acute kidney failure, unspecified: Secondary | ICD-10-CM | POA: Diagnosis present

## 2023-04-24 DIAGNOSIS — I2510 Atherosclerotic heart disease of native coronary artery without angina pectoris: Secondary | ICD-10-CM | POA: Diagnosis present

## 2023-04-24 DIAGNOSIS — Z833 Family history of diabetes mellitus: Secondary | ICD-10-CM | POA: Diagnosis not present

## 2023-04-24 DIAGNOSIS — E785 Hyperlipidemia, unspecified: Secondary | ICD-10-CM | POA: Diagnosis present

## 2023-04-24 DIAGNOSIS — J205 Acute bronchitis due to respiratory syncytial virus: Secondary | ICD-10-CM | POA: Diagnosis present

## 2023-04-24 DIAGNOSIS — E119 Type 2 diabetes mellitus without complications: Secondary | ICD-10-CM | POA: Diagnosis present

## 2023-04-24 DIAGNOSIS — C787 Secondary malignant neoplasm of liver and intrahepatic bile duct: Secondary | ICD-10-CM | POA: Diagnosis present

## 2023-04-24 DIAGNOSIS — Z87891 Personal history of nicotine dependence: Secondary | ICD-10-CM | POA: Diagnosis not present

## 2023-04-24 DIAGNOSIS — Z90710 Acquired absence of both cervix and uterus: Secondary | ICD-10-CM

## 2023-04-24 DIAGNOSIS — Z79899 Other long term (current) drug therapy: Secondary | ICD-10-CM

## 2023-04-24 DIAGNOSIS — D696 Thrombocytopenia, unspecified: Secondary | ICD-10-CM | POA: Diagnosis present

## 2023-04-24 DIAGNOSIS — Z823 Family history of stroke: Secondary | ICD-10-CM | POA: Diagnosis not present

## 2023-04-24 DIAGNOSIS — Z7189 Other specified counseling: Secondary | ICD-10-CM

## 2023-04-24 LAB — ECHOCARDIOGRAM COMPLETE
AR max vel: 2.01 cm2
AV Area VTI: 2.04 cm2
AV Area mean vel: 1.94 cm2
AV Mean grad: 4 mm[Hg]
AV Peak grad: 7.7 mm[Hg]
Ao pk vel: 1.39 m/s
Area-P 1/2: 2.46 cm2
Height: 59 in
S' Lateral: 2.3 cm
Weight: 2116.42 [oz_av]

## 2023-04-24 LAB — CBC
HCT: 43.3 % (ref 36.0–46.0)
Hemoglobin: 13.5 g/dL (ref 12.0–15.0)
MCH: 30.8 pg (ref 26.0–34.0)
MCHC: 31.2 g/dL (ref 30.0–36.0)
MCV: 98.9 fL (ref 80.0–100.0)
Platelets: 164 10*3/uL (ref 150–400)
RBC: 4.38 MIL/uL (ref 3.87–5.11)
RDW: 13.5 % (ref 11.5–15.5)
WBC: 5.5 10*3/uL (ref 4.0–10.5)
nRBC: 0 % (ref 0.0–0.2)

## 2023-04-24 LAB — BLOOD GAS, VENOUS
Acid-Base Excess: 5.7 mmol/L — ABNORMAL HIGH (ref 0.0–2.0)
Bicarbonate: 33.7 mmol/L — ABNORMAL HIGH (ref 20.0–28.0)
O2 Saturation: 83.5 %
Patient temperature: 37
pCO2, Ven: 64 mm[Hg] — ABNORMAL HIGH (ref 44–60)
pH, Ven: 7.33 (ref 7.25–7.43)
pO2, Ven: 53 mm[Hg] — ABNORMAL HIGH (ref 32–45)

## 2023-04-24 LAB — HEPATIC FUNCTION PANEL
ALT: 14 U/L (ref 0–44)
AST: 22 U/L (ref 15–41)
Albumin: 3.7 g/dL (ref 3.5–5.0)
Alkaline Phosphatase: 66 U/L (ref 38–126)
Bilirubin, Direct: 0.4 mg/dL — ABNORMAL HIGH (ref 0.0–0.2)
Indirect Bilirubin: 0.5 mg/dL (ref 0.3–0.9)
Total Bilirubin: 0.9 mg/dL (ref 0.0–1.2)
Total Protein: 7.3 g/dL (ref 6.5–8.1)

## 2023-04-24 LAB — BASIC METABOLIC PANEL
Anion gap: 8 (ref 5–15)
BUN: 27 mg/dL — ABNORMAL HIGH (ref 8–23)
CO2: 25 mmol/L (ref 22–32)
Calcium: 8.5 mg/dL — ABNORMAL LOW (ref 8.9–10.3)
Chloride: 109 mmol/L (ref 98–111)
Creatinine, Ser: 0.8 mg/dL (ref 0.44–1.00)
GFR, Estimated: >60 mL/min (ref >60)
Glucose, Bld: 145 mg/dL — ABNORMAL HIGH (ref 70–99)
Potassium: 3.9 mmol/L (ref 3.5–5.1)
Sodium: 142 mmol/L (ref 135–145)

## 2023-04-24 LAB — RESP PANEL BY RT-PCR (RSV, FLU A&B, COVID)  RVPGX2
Influenza A by PCR: NEGATIVE
Influenza B by PCR: NEGATIVE
Resp Syncytial Virus by PCR: POSITIVE — AB
SARS Coronavirus 2 by RT PCR: NEGATIVE

## 2023-04-24 LAB — CBC WITH DIFFERENTIAL/PLATELET
Abs Immature Granulocytes: 0.02 10*3/uL (ref 0.00–0.07)
Basophils Absolute: 0 10*3/uL (ref 0.0–0.1)
Basophils Relative: 0 %
Eosinophils Absolute: 0.2 10*3/uL (ref 0.0–0.5)
Eosinophils Relative: 4 %
HCT: 42.2 % (ref 36.0–46.0)
Hemoglobin: 13 g/dL (ref 12.0–15.0)
Immature Granulocytes: 0 %
Lymphocytes Relative: 32 %
Lymphs Abs: 1.6 10*3/uL (ref 0.7–4.0)
MCH: 30.8 pg (ref 26.0–34.0)
MCHC: 30.8 g/dL (ref 30.0–36.0)
MCV: 100 fL (ref 80.0–100.0)
Monocytes Absolute: 0.5 10*3/uL (ref 0.1–1.0)
Monocytes Relative: 10 %
Neutro Abs: 2.7 10*3/uL (ref 1.7–7.7)
Neutrophils Relative %: 54 %
Platelets: 144 10*3/uL — ABNORMAL LOW (ref 150–400)
RBC: 4.22 MIL/uL (ref 3.87–5.11)
RDW: 13.3 % (ref 11.5–15.5)
WBC: 5.1 10*3/uL (ref 4.0–10.5)
nRBC: 0 % (ref 0.0–0.2)

## 2023-04-24 LAB — BRAIN NATRIURETIC PEPTIDE: B Natriuretic Peptide: 107.2 pg/mL — ABNORMAL HIGH (ref 0.0–100.0)

## 2023-04-24 LAB — URINALYSIS, ROUTINE W REFLEX MICROSCOPIC
Bilirubin Urine: NEGATIVE
Glucose, UA: NEGATIVE mg/dL
Hgb urine dipstick: NEGATIVE
Ketones, ur: NEGATIVE mg/dL
Leukocytes,Ua: NEGATIVE
Nitrite: NEGATIVE
Protein, ur: NEGATIVE mg/dL
Specific Gravity, Urine: 1.039 — ABNORMAL HIGH (ref 1.005–1.030)
pH: 5 (ref 5.0–8.0)

## 2023-04-24 LAB — TSH: TSH: 1.003 u[IU]/mL (ref 0.350–4.500)

## 2023-04-24 LAB — PROCALCITONIN: Procalcitonin: <0.1 ng/mL

## 2023-04-24 LAB — CREATININE, SERUM
Creatinine, Ser: 0.7 mg/dL (ref 0.44–1.00)
GFR, Estimated: >60 mL/min (ref >60)

## 2023-04-24 LAB — LIPASE, BLOOD: Lipase: 28 U/L (ref 11–51)

## 2023-04-24 LAB — MAGNESIUM: Magnesium: 2.2 mg/dL (ref 1.7–2.4)

## 2023-04-24 LAB — PHOSPHORUS: Phosphorus: 4.2 mg/dL (ref 2.5–4.6)

## 2023-04-24 LAB — PROTIME-INR
INR: 1.1 (ref 0.8–1.2)
Prothrombin Time: 14.7 s (ref 11.4–15.2)

## 2023-04-24 LAB — TROPONIN I (HIGH SENSITIVITY)
Troponin I (High Sensitivity): 9 ng/L (ref <18)
Troponin I (High Sensitivity): 11 ng/L (ref <18)

## 2023-04-24 LAB — LACTIC ACID, PLASMA
Lactic Acid, Venous: 0.8 mmol/L (ref 0.5–1.9)
Lactic Acid, Venous: 1.2 mmol/L (ref 0.5–1.9)

## 2023-04-24 LAB — MRSA NEXT GEN BY PCR, NASAL: MRSA by PCR Next Gen: NOT DETECTED

## 2023-04-24 MED ORDER — SODIUM CHLORIDE 0.9 % IV SOLN
1.0000 g | INTRAVENOUS | Status: AC
Start: 2023-04-25 — End: 2023-04-28
  Administered 2023-04-25 – 2023-04-28 (×4): 1 g via INTRAVENOUS
  Filled 2023-04-24 (×5): qty 10

## 2023-04-24 MED ORDER — ACETAMINOPHEN 650 MG RE SUPP
650.0000 mg | Freq: Four times a day (QID) | RECTAL | Status: DC | PRN
Start: 2023-04-24 — End: 2023-04-29

## 2023-04-24 MED ORDER — SODIUM CHLORIDE 0.9 % IV SOLN
500.0000 mg | Freq: Once | INTRAVENOUS | Status: AC
  Administered 2023-04-24: 500 mg via INTRAVENOUS
  Filled 2023-04-24: qty 5

## 2023-04-24 MED ORDER — FUROSEMIDE 10 MG/ML IJ SOLN
40.0000 mg | Freq: Two times a day (BID) | INTRAMUSCULAR | Status: DC
Start: 2023-04-24 — End: 2023-04-25
  Administered 2023-04-25: 40 mg via INTRAVENOUS
  Filled 2023-04-24: qty 4

## 2023-04-24 MED ORDER — IPRATROPIUM-ALBUTEROL 0.5-2.5 (3) MG/3ML IN SOLN
3.0000 mL | Freq: Once | RESPIRATORY_TRACT | Status: AC
  Administered 2023-04-24: 3 mL via RESPIRATORY_TRACT
  Filled 2023-04-24: qty 3

## 2023-04-24 MED ORDER — SODIUM CHLORIDE 0.9 % IV SOLN
500.0000 mg | INTRAVENOUS | Status: DC
  Administered 2023-04-25: 500 mg via INTRAVENOUS
  Filled 2023-04-24: qty 5

## 2023-04-24 MED ORDER — HALOPERIDOL LACTATE 5 MG/ML IJ SOLN
2.0000 mg | Freq: Once | INTRAMUSCULAR | Status: AC
  Administered 2023-04-24: 2 mg via INTRAVENOUS
  Filled 2023-04-24: qty 1

## 2023-04-24 MED ORDER — ACETAMINOPHEN 325 MG PO TABS: 650.0000 mg | ORAL_TABLET | Freq: Four times a day (QID) | ORAL | Status: DC | PRN

## 2023-04-24 MED ORDER — ORAL CARE MOUTH RINSE
15.0000 mL | OROMUCOSAL | Status: DC | PRN
Start: 2023-04-24 — End: 2023-04-29

## 2023-04-24 MED ORDER — POLYETHYLENE GLYCOL 3350 17 G PO PACK: 17.0000 g | PACK | Freq: Every day | ORAL | Status: DC | PRN

## 2023-04-24 MED ORDER — METHYLPREDNISOLONE SODIUM SUCC 125 MG IJ SOLR
125.0000 mg | Freq: Once | INTRAMUSCULAR | Status: AC
  Administered 2023-04-24: 125 mg via INTRAVENOUS
  Filled 2023-04-24: qty 2

## 2023-04-24 MED ORDER — IOHEXOL 350 MG/ML SOLN
75.0000 mL | Freq: Once | INTRAVENOUS | Status: AC | PRN
  Administered 2023-04-24: 75 mL via INTRAVENOUS

## 2023-04-24 MED ORDER — ENOXAPARIN SODIUM 40 MG/0.4ML IJ SOSY
40.0000 mg | PREFILLED_SYRINGE | INTRAMUSCULAR | Status: DC
  Administered 2023-04-24 – 2023-04-26 (×3): 40 mg via SUBCUTANEOUS
  Filled 2023-04-24 (×3): qty 0.4

## 2023-04-24 MED ORDER — ALBUTEROL SULFATE (2.5 MG/3ML) 0.083% IN NEBU
2.5000 mg | INHALATION_SOLUTION | RESPIRATORY_TRACT | Status: DC | PRN
  Administered 2023-04-25: 2.5 mg via RESPIRATORY_TRACT
  Filled 2023-04-24: qty 3

## 2023-04-24 MED ORDER — CHLORHEXIDINE GLUCONATE CLOTH 2 % EX PADS
6.0000 | MEDICATED_PAD | Freq: Every day | CUTANEOUS | Status: DC
  Administered 2023-04-24 – 2023-04-28 (×4): 6 via TOPICAL

## 2023-04-24 MED ORDER — FUROSEMIDE 10 MG/ML IJ SOLN
20.0000 mg | Freq: Once | INTRAMUSCULAR | Status: AC
  Administered 2023-04-24: 20 mg via INTRAVENOUS
  Filled 2023-04-24: qty 4

## 2023-04-24 MED ORDER — QUETIAPINE FUMARATE 50 MG PO TABS
50.0000 mg | ORAL_TABLET | Freq: Once | ORAL | Status: AC
  Administered 2023-04-24: 50 mg via ORAL
  Filled 2023-04-24: qty 1

## 2023-04-24 MED ORDER — SODIUM CHLORIDE 0.9 % IV SOLN
1.0000 g | Freq: Once | INTRAVENOUS | Status: AC
  Administered 2023-04-24: 1 g via INTRAVENOUS
  Filled 2023-04-24: qty 10

## 2023-04-24 NOTE — ED Provider Notes (Signed)
 [place] [facility] DEPARTMENT AT [facility] Provider Note   CSN: [id number] Arrival date & time: 04/24/23  0108     History  Chief Complaint  Patient presents with   Shortness of Breath    [name] is a 89 y.o. female.  Patient does not know why she is here.  She was sent by her facility with increasing shortness of breath for the past 3 days and arrived on CPAP which was weaned off on arrival.  She has a history of a CABG remotely, CAD, diabetes, GERD, hypertension, hiatal hernia.  No known history of COPD or asthma.  No history of CHF. EMS reports increased shortness of breath over the past 3 days worse with lying down.  She was placed on CPAP on arrival.  She is alert and oriented to person on arrival.  Denies fever.  Denies chest pain.  She does not feel short of breath.  Has had some increased leg swelling as well.  No abdominal pain, vomiting or diarrhea  The history is provided by the patient and the EMS personnel. The history is limited by the condition of the patient.  Shortness of Breath      Home Medications Prior to Admission medications   Medication Sig Start Date End Date Taking? Authorizing Provider  acetaminophen  (TYLENOL ) 500 MG tablet Take 1,000 mg by mouth 2 (two) times daily.     [provider]  amLODipine  (NORVASC ) 5 MG tablet Take 1 tablet (5 mg total) by mouth 2 (two) times daily. 03/19/22   Geofm Glade PARAS, MD  aspirin  EC 81 MG tablet Take 81 mg by mouth daily.    [provider]  busPIRone  (BUSPAR ) 15 MG tablet TAKE 1 TABLET BY MOUTH TWICE A DAY 01/29/23   Georjean Darice HERO, MD  carvedilol  (COREG ) 6.25 MG tablet TAKE 1 TABLET BY MOUTH TWICE A DAY 10/12/22   Okey Vina GAILS, MD  cetirizine  (ZYRTEC ) 10 MG tablet TAKE ONE TABLET BY MOUTH DAILY 02/22/17   Geofm Glade PARAS, MD  Cholecalciferol (D3-1000) 25 MCG (1000 UT) capsule Take 1,000 Units by mouth daily.    [provider]  donepezil  (ARICEPT ) 10 MG tablet TAKE 1  TABLET BY MOUTH DAILY 01/29/23   Georjean Darice HERO, MD  ferrous sulfate 324 MG TBEC Take 324 mg by mouth daily.    [provider]  gabapentin  (NEURONTIN ) 300 MG capsule Take 1 capsule (300 mg total) by mouth at bedtime. 03/19/22   Geofm Glade PARAS, MD  MELATONIN PO Take 6 mg by mouth at bedtime.    [provider]  meloxicam  (MOBIC ) 7.5 MG tablet Take 7.5 mg by mouth daily.    [provider]  memantine  (NAMENDA ) 10 MG tablet TAKE 1 TABLET BY MOUTH TWICE A DAY 01/29/23   Georjean Darice HERO, MD  Menthol , Topical Analgesic, (BIOFREEZE) 4 % GEL Apply 1 Application topically in the morning, at noon, and at bedtime. To mid back and right wrist    [provider]  omeprazole  (PRILOSEC) 40 MG capsule Take 1 capsule (40 mg total) by mouth daily. 03/19/22   Geofm Glade PARAS, MD  QUEtiapine  (SEROQUEL ) 50 MG tablet TAKE 1 TABLET BY MOUTH AT BEDTIME 01/29/23   Georjean Darice HERO, MD  risperiDONE  (RISPERDAL ) 0.25 MG tablet Take 1 tablet (0.25 mg total) by mouth daily. Patient taking differently: Take 0.25 mg by mouth 2 (two) times daily. At 9am and 3pm 08/05/22   Georjean Darice HERO, MD  SANTYL 250 UNIT/GM ointment Apply 1 Application topically daily. 06/24/22   [provider]  traMADol  (ULTRAM ) 50 MG tablet Take 50 mg by mouth 3 (three) times daily as needed for moderate pain. 07/09/22   [provider]  zinc oxide 20 % ointment Apply 1 Application topically 2 (two) times daily.    [provider]      Allergies    Depakote  [divalproex  sodium]    Review of Systems   Review of Systems  Unable to perform ROS: Dementia  Respiratory:  Positive for shortness of breath.     Physical Exam Updated Vital Signs BP (!) 135/95 (BP Location: Right Arm)   Pulse (!) 56   Temp 97.6 F (36.4 C) (Oral)   Resp 18   Ht 4' 11 (1.499 m)   Wt 60 kg   SpO2 100%   BMI 26.72 kg/m  Physical Exam Vitals and nursing note reviewed.  Constitutional:      General: She is not  in acute distress.    Appearance: She is well-developed.     Comments: Dyspneic with conversation  HENT:     Head: Normocephalic and atraumatic.     Mouth/Throat:     Pharynx: No oropharyngeal exudate.  Eyes:     Conjunctiva/sclera: Conjunctivae normal.     Pupils: Pupils are equal, round, and reactive to light.  Neck:     Comments: No meningismus. Cardiovascular:     Rate and Rhythm: Normal rate and regular rhythm.     Heart sounds: Normal heart sounds. No murmur heard. Pulmonary:     Effort: Pulmonary effort is normal. No respiratory distress.     Breath sounds: Wheezing present.     Comments: scattered expiratory wheezing bilaterally with rhonchi. Chest:     Chest wall: No tenderness.  Abdominal:     Palpations: Abdomen is soft.     Tenderness: There is no abdominal tenderness. There is no guarding or rebound.  Musculoskeletal:        General: No tenderness. Normal range of motion.     Cervical back: Normal range of motion and neck supple.     Right lower leg: Edema present.     Left lower leg: Edema present.  Skin:    General: Skin is warm.  Neurological:     Mental Status: She is alert.     Cranial Nerves: No cranial nerve deficit.     Motor: No abnormal muscle tone.     Coordination: Coordination normal.     Comments:  5/5 strength throughout. CN 2-12 intact.Equal grip strength.  Oriented to person only  Psychiatric:        Behavior: Behavior normal.     ED Results / Procedures / Treatments   Labs (all labs ordered are listed, but only abnormal results are displayed) Labs Reviewed  RESP PANEL BY RT-PCR (RSV, FLU A&B, COVID)  RVPGX2 - Abnormal; Notable for the following components:      Result Value   Resp Syncytial Virus by PCR POSITIVE (*)    All other components within normal limits  CBC WITH DIFFERENTIAL/PLATELET - Abnormal; Notable for the following components:   Platelets 144 (*)    All other components within normal limits  BASIC METABOLIC PANEL -  Abnormal; Notable for the following components:   Glucose, Bld 145 (*)    BUN 27 (*)    Calcium  8.5 (*)    All other components within normal limits  BRAIN NATRIURETIC PEPTIDE - Abnormal; Notable  for the following components:   B Natriuretic Peptide 107.2 (*)    All other components within normal limits  BLOOD GAS, VENOUS - Abnormal; Notable for the following components:   pCO2, Ven 64 (*)    pO2, Ven 53 (*)    Bicarbonate 33.7 (*)    Acid-Base Excess 5.7 (*)    All other components within normal limits  CULTURE, BLOOD (ROUTINE X 2)  CULTURE, BLOOD (ROUTINE X 2)  LACTIC ACID, PLASMA  LACTIC ACID, PLASMA  HEPATIC FUNCTION PANEL  LIPASE, BLOOD  PROTIME-INR  TROPONIN I (HIGH SENSITIVITY)  TROPONIN I (HIGH SENSITIVITY)    EKG None  Radiology CT Angio Chest PE W and/or Wo Contrast Result Date: 04/24/2023 CLINICAL DATA:  89 year old female with history of shortness of breath for the past 3 days. EXAM: CT ANGIOGRAPHY CHEST WITH CONTRAST TECHNIQUE: Multidetector CT imaging of the chest was performed using the standard protocol during bolus administration of intravenous contrast. Multiplanar CT image reconstructions and MIPs were obtained to evaluate the vascular anatomy. RADIATION DOSE REDUCTION: This exam was performed according to the departmental dose-optimization program which includes automated exposure control, adjustment of the mA and/or kV according to patient size and/or use of iterative reconstruction technique. CONTRAST:  75mL OMNIPAQUE  IOHEXOL  350 MG/ML SOLN COMPARISON:  No priors. FINDINGS: Cardiovascular: Study is slightly limited by patient respiratory motion. With these limitations in mind, there is no central, lobar or segmental sized filling defect in the pulmonary arteries to suggest clinically significant pulmonary embolism. Smaller subsegmental sized filling defects are not entirely excluded. Heart size is mildly enlarged. There is no significant pericardial fluid,  thickening or pericardial calcification. There is aortic atherosclerosis, as well as atherosclerosis of the great vessels of the mediastinum and the coronary arteries, including calcified atherosclerotic plaque in the left main, left anterior descending, left circumflex and right coronary arteries. Status post median sternotomy for CABG. Mediastinum/Nodes: No pathologically enlarged mediastinal or hilar lymph nodes. Esophagus is unremarkable in appearance. Lungs/Pleura: Partial collapse of the trachea and mainstem bronchi indicative of tracheobronchomalacia. Patchy areas of ground-glass attenuation, septal thickening and scattered patchy consolidation in the lungs bilaterally, favored to reflect a background of pulmonary edema (although multilobar bilateral bronchopneumonia could have a similar appearance). No pleural effusions. Upper Abdomen: Multiple large masses are noted within the liver, largest of which in the superior aspect of the right lobe of the liver measures up to 13.8 x 6.8 cm (axial image 90 of series 10), incompletely characterized on today's noncontrast CT examination, but concerning for multifocal hepatic metastases. Musculoskeletal: Status post bilateral shoulder arthroplasty. Median sternotomy wires. There are no aggressive appearing lytic or blastic lesions noted in the visualized portions of the skeleton. Review of the MIP images confirms the above findings. IMPRESSION: 1. Limited study demonstrating no central, lobar or segmental sized pulmonary embolism. 2. The overall appearance of the chest suggests underlying congestive heart failure, as above. 3. Tracheobronchomalacia. 4. Multiple indeterminate liver masses highly concerning for widespread metastatic disease to the liver. Further evaluation with nonemergent abdominal MRI with and without IV gadolinium should be considered to better evaluate these findings. Aortic Atherosclerosis (ICD10-I70.0). Electronically Signed   By: Toribio Aye  M.D.   On: 04/24/2023 05:19   DG Chest Portable 1 View Result Date: 04/24/2023 CLINICAL DATA:  Shortness of breath EXAM: PORTABLE CHEST 1 VIEW COMPARISON:  01/30/2022 FINDINGS: Low lung volumes accentuate cardiomediastinal silhouette and pulmonary vascularity. Bilateral mid and lower lung airspace opacities. No definite pleural effusion or pneumothorax. Sternotomy and  CABG. Aortic atherosclerotic calcification. Bilateral reverse TSH. IMPRESSION: Hypoinflation. Bilateral mid and lower lung airspace opacities, atelectasis versus pneumonia. Electronically Signed   By: Norman Gatlin M.D.   On: 04/24/2023 02:14    Procedures .Critical Care  Performed by: Carita Senior, MD Authorized by: Carita Senior, MD   Critical care provider statement:    Critical care time (minutes):  45   Critical care time was exclusive of:  Separately billable procedures and treating other patients   Critical care was necessary to treat or prevent imminent or life-threatening deterioration of the following conditions:  Respiratory failure   Critical care was time spent personally by me on the following activities:  Development of treatment plan with patient or surrogate, discussions with consultants, evaluation of patient's response to treatment, examination of patient, ordering and review of laboratory studies, ordering and review of radiographic studies, ordering and performing treatments and interventions, pulse oximetry, re-evaluation of patient's condition, review of old charts, blood draw for specimens and obtaining history from patient or surrogate   I assumed direction of critical care for this patient from another provider in my specialty: no     Care discussed with: admitting provider       Medications Ordered in ED Medications  ipratropium-albuterol  (DUONEB) 0.5-2.5 (3) MG/3ML nebulizer solution 3 mL (has no administration in time range)    ED Course/ Medical Decision Making/ A&P                                  Medical Decision Making Amount and/or Complexity of Data Reviewed Independent Historian: EMS Labs: ordered. Decision-making details documented in ED Course. Radiology: ordered and independent interpretation performed. Decision-making details documented in ED Course. ECG/medicine tests: ordered and independent interpretation performed. Decision-making details documented in ED Course.  Risk Prescription drug management. Decision regarding hospitalization.   Shortness of breath worsening for the past 3 days.  Arrived on CPAP which was read nasal cannula on arrival.  Concern for CHF exacerbation.  Will check labs, EKG, chest x-ray.  Give DuoNebs and Lasix .  X-ray concerning for bibasilar opacities atelectasis versus pneumonia.  Results reviewed and interpreted by me.  BNP not severely elevated at 107.  EKG without acute ischemia.  Does show T wave inversion similar to previous.  Daughter at bedside confirms DNR/DNI.  Patient given bronchodilators and IV Lasix  with concern for acute onset CHF exacerbation.  Also concern for pneumonia and antibiotics were given.  RSV is positive.  CT is negative for pulmonary embolism. Does show evidence of the apparent new onset CHF as well as liver masses concerning for metastasis.  CT findings discussed with patient's daughter at bedside.  She is made aware of liver metastasis.  Given patient's age she does not feel patient would want further workup of this.  She will discuss with patient.  Plan admission for likely new onset CHF in the setting of RSV bronchitis and possible pneumonia.  Continue IV antibiotics and Lasix  blood cultures are pending.  CT negative for pulmonary embolism. Daughter request to defer further workup of potential liver metastases at this time.  Admission discussed with Dr. Alfornia.          Final Clinical Impression(s) / ED Diagnoses Final diagnoses:  Acute respiratory failure with hypoxia (HCC)  RSV bronchitis   Acute on chronic systolic congestive heart failure (HCC)    Rx / DC Orders ED Discharge Orders     None  Carita Senior, MD 04/24/23 [phone]

## 2023-04-24 NOTE — ED Notes (Signed)
Contacted respiratory

## 2023-04-24 NOTE — ED Triage Notes (Signed)
 Patient c/o SOB x3 days from [facility]. Per report pt SOB getting worse tonight. CPAP was started by EMS. Patient a/ox1. Hx of dementia.

## 2023-04-24 NOTE — ED Notes (Signed)
 Pt has two IV's in place, however neither pull blood back very well. Only 1 blue bottle could be obtained

## 2023-04-24 NOTE — H&P (Signed)
 History and Physical    [name] FMW:[id number] DOB: 06-Jun-1930 DOA: 04/24/2023  PCP: Geofm Glade PARAS, MD  Patient coming from: [facility]  I have personally briefly reviewed patient's old medical records in [facility]  Chief Complaint: Shortness of breath since 3 days  HPI: [name] is a 89 y.o. female with medical history significant of dementia, hypertension, depression, CAD status post CABG, hyperlipidemia, type 2 diabetes, fibromyalgia presented with shortness of breath since 3 days.  EMS was called and found to have low oxygen saturation and was placed on CPAP on arrival.  History gathered from the patient's daughter on the phone and ED notes.  Per report patient was doing fine 3 days ago then she started having some shortness of breath and some swelling in her legs.  No fever, chills, chest pain.    ED Course: Upon arrival, afebrile, pulse 56, RR: 18, BP: 135/95, placed on nasal cannula, no leukocytosis, H&H 13.0/42.2, PLT: 144, NA: 142, K: 3.9, creatinine: 0.80, BNP: 107, respiratory panel positive for RSV, lactic acid: WNL, troponin negative x 2.  Chest x-ray concerning for bilateral lower lung airspace opacities atelectasis versus pneumonia.  CT chest showed no PE concerning for congestive heart failure, tracheal malacia, multiple liver masses highly concerning for metastatic disease to the liver.  Patient was given IV Rocephin , Zithromax , DuoNebs, prednisone  and Lasix  20 and [facility] hospital[facility] consulted for admission  Review of Systems: As per HPI otherwise negative.    Past Medical History:  Diagnosis Date   Acute cystitis without hematuria 02/04/2022   Acute metabolic encephalopathy 01/31/2022   Acute respiratory failure with hypoxia (HCC) 01/30/2022   AKI (acute kidney injury) (HCC) 01/31/2022   CAD, NATIVE VESSEL 06/09/2008   Dehydration 01/30/2022   Delirium with dementia 01/08/2022   DIABETES MELLITUS, TYPE II    Encounter for testing for  latent tuberculosis 01/13/2022   FIBROMYALGIA    GERD    Gout    HIATAL HERNIA    HYPERLIPIDEMIA    HYPERTENSION    Hypokalemia 01/31/2022   Insomnia    OA (osteoarthritis)    severe, shoulders, hands - inflammatory, ?RA   Pressure ulcer of heel, right, unstageable (HCC) 06/12/2022   S/P CABG x 1    S/P inguinal hernia repair    TUBULOVILLOUS ADENOMA, 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   colo 04/2012 - no polyps - no further screening planned (age >30)   UNSPECIFIED ANEMIA    Wrist pain, acute, right 03/18/2022    Past Surgical History:  Procedure Laterality Date   ABDOMINAL HYSTERECTOMY  1981   APPENDECTOMY     CATARACT EXTRACTION, BILATERAL     CORONARY ARTERY BYPASS GRAFT  2010   HERNIA REPAIR     INGUINAL   I & D EXTREMITY Left 12/07/2017   Procedure: IRRIGATION AND DEBRIDEMENT EXTREMITY WITH FOREIGN BODY REMOVAL;  Surgeon: Sharl Selinda Dover, MD;  Location: [facility] OR;  Service: Orthopedics;  Laterality: Left;   JOINT REPLACEMENT     OVARIAN CYST REMOVAL     REPLACEMENT TOTAL KNEE BILATERAL Bilateral    Rt-1974, Lt -1989   REVERSE SHOULDER ARTHROPLASTY  09/08/2011   Procedure: REVERSE SHOULDER ARTHROPLASTY;  Surgeon: Eva Elsie Herring, MD;  Location: [facility] OR;  Service: Orthopedics;  Laterality: Left;  left reverse total shoulder arthroplasty   ROTATOR CUFF REPAIR     Left     reports that she quit smoking about 53 years ago. Her smoking use included cigarettes. She has never used  smokeless tobacco. She reports that she does not currently use alcohol after a past usage of about 1.0 standard drink of alcohol per week. She reports that she does not use drugs.  Allergies  Allergen Reactions   Depakote  [Divalproex  Sodium] Other (See Comments)    Had worsening neutropenia and thrombocytopenia on medication.  Darice Grayer FNP-C    Family History  Problem Relation Age of Onset   Cancer Mother        Stomach   Stomach cancer Mother 74   Hypertension Mother    Heart disease Father 66        MI   Diabetes Father    Heart attack Father    Vascular Disease Sister    Stroke Sister    Dementia Sister    Diabetes Sister    Parkinsonism Sister    Stroke Sister    Heart disease Sister    Heart attack Brother    Stroke Daughter    Diabetes Daughter    Hypertension Daughter    Colon cancer Neg Hx     Prior to Admission medications   Medication Sig Start Date End Date Taking? Authorizing Provider  acetaminophen  (TYLENOL ) 500 MG tablet Take 1,000 mg by mouth 2 (two) times daily.    Yes [provider]  amLODipine  (NORVASC ) 5 MG tablet Take 1 tablet (5 mg total) by mouth 2 (two) times daily. 03/19/22  Yes Burns, Glade PARAS, MD  busPIRone  (BUSPAR ) 15 MG tablet TAKE 1 TABLET BY MOUTH TWICE A DAY 01/29/23  Yes Georjean Darice HERO, MD  carvedilol  (COREG ) 6.25 MG tablet TAKE 1 TABLET BY MOUTH TWICE A DAY 10/12/22  Yes Okey Vina GAILS, MD  donepezil  (ARICEPT ) 10 MG tablet TAKE 1 TABLET BY MOUTH DAILY 01/29/23  Yes Georjean Darice HERO, MD  gabapentin  (NEURONTIN ) 300 MG capsule Take 1 capsule (300 mg total) by mouth at bedtime. 03/19/22  Yes Burns, Glade PARAS, MD  guaifenesin  (HUMIBID E) 400 MG TABS tablet Take 400 mg by mouth in the morning and at bedtime. For 5 days starting 04/21/23   Yes [provider]  meloxicam  (MOBIC ) 7.5 MG tablet Take 7.5 mg by mouth daily.   Yes [provider]  memantine  (NAMENDA ) 10 MG tablet TAKE 1 TABLET BY MOUTH TWICE A DAY 01/29/23  Yes Georjean Darice HERO, MD  Menthol , Topical Analgesic, (BIOFREEZE) 4 % GEL Apply 1 Application topically in the morning, at noon, and at bedtime. To mid back and right wrist   Yes [provider]  omeprazole  (PRILOSEC) 20 MG capsule Take 40 mg by mouth daily.   Yes [provider]  QUEtiapine  (SEROQUEL ) 50 MG tablet TAKE 1 TABLET BY MOUTH AT BEDTIME 01/29/23  Yes Georjean Darice HERO, MD  risperiDONE  (RISPERDAL ) 0.5 MG tablet Take 0.5 mg by mouth 2 (two) times daily.   Yes [provider]   traMADol  (ULTRAM ) 50 MG tablet Take 50 mg by mouth 3 (three) times daily as needed for moderate pain. 07/09/22  Yes [provider]  aspirin  EC 81 MG tablet Take 81 mg by mouth daily. Patient not taking: Reported on 04/24/2023    [provider]    Physical Exam: Vitals:   04/24/23 0530 04/24/23 0600 04/24/23 0900 04/24/23 0915  BP:  (!) 124/95 (!) 142/80   Pulse:  (!) 52 (!) 57   Resp:  16 18   Temp: 97.7 F (36.5 C)   (!) 97.3 F (36.3 C)  TempSrc: Axillary   Oral  SpO2:  99% 95%   Weight:      Height:        Constitutional: NAD, calm, comfortable, elderly female, on nasal cannula, appears weak, dehydrated, sick Eyes: PERRL, lids and conjunctivae normal ENMT: Mucous membranes are dry, posterior pharynx clear of any exudate or lesions.Normal dentition.  Neck: normal, supple, no masses, no thyromegaly Respiratory: clear to auscultation bilaterally, no wheezing, no crackles. Normal respiratory effort. No accessory muscle use.  Cardiovascular: Regular rate and rhythm, no murmurs / rubs / gallops. No extremity edema. 2+ pedal pulses. No carotid bruits.  Abdomen: no tenderness, no masses palpated. No hepatosplenomegaly. Bowel sounds positive.  Musculoskeletal: no clubbing / cyanosis. No joint deformity upper and lower extremities. Good ROM, no contractures. Normal muscle tone.  Skin: no rashes, lesions, ulcers. No induration Neurologic: Sleepy but arousable.  Not following commands   Labs on Admission: I have personally reviewed following labs and imaging studies  CBC: Recent Labs  Lab 04/24/23 0144  WBC 5.1  NEUTROABS 2.7  HGB 13.0  HCT 42.2  MCV 100.0  PLT 144*   Basic Metabolic Panel: Recent Labs  Lab 04/24/23 0144  NA 142  K 3.9  CL 109  CO2 25  GLUCOSE 145*  BUN 27*  CREATININE 0.80  CALCIUM  8.5*   GFR: Estimated Creatinine Clearance: 36.8 mL/min (by C-G formula based on SCr of 0.8 mg/dL). Liver Function Tests: Recent Labs  Lab  04/24/23 0646  AST 22  ALT 14  ALKPHOS 66  BILITOT 0.9  PROT 7.3  ALBUMIN 3.7   Recent Labs  Lab 04/24/23 0646  LIPASE 28   No results for input(s): AMMONIA in the last 168 hours. Coagulation Profile: Recent Labs  Lab 04/24/23 0735  INR 1.1   Cardiac Enzymes: No results for input(s): CKTOTAL, CKMB, CKMBINDEX, TROPONINI in the last 168 hours. BNP (last 3 results) No results for input(s): PROBNP in the last 8760 hours. HbA1C: No results for input(s): HGBA1C in the last 72 hours. CBG: No results for input(s): GLUCAP in the last 168 hours. Lipid Profile: No results for input(s): CHOL, HDL, LDLCALC, TRIG, CHOLHDL, LDLDIRECT in the last 72 hours. Thyroid  Function Tests: No results for input(s): TSH, T4TOTAL, FREET4, T3FREE, THYROIDAB in the last 72 hours. Anemia Panel: No results for input(s): VITAMINB12, FOLATE, FERRITIN, TIBC, IRON , RETICCTPCT in the last 72 hours. Urine analysis:    Component Value Date/Time   COLORURINE YELLOW 01/30/2022 2055   APPEARANCEUR HAZY (A) 01/30/2022 2055   LABSPEC 1.018 01/30/2022 2055   PHURINE 5.0 01/30/2022 2055   GLUCOSEU NEGATIVE 01/30/2022 2055   GLUCOSEU NEGATIVE 02/27/2021 1046   HGBUR NEGATIVE 01/30/2022 2055   BILIRUBINUR NEGATIVE 01/30/2022 2055   KETONESUR 5 (A) 01/30/2022 2055   PROTEINUR 30 (A) 01/30/2022 2055   UROBILINOGEN 0.2 02/27/2021 1046   NITRITE NEGATIVE 01/30/2022 2055   LEUKOCYTESUR TRACE (A) 01/30/2022 2055    Radiological Exams on Admission: CT Angio Chest PE W and/or Wo Contrast Result Date: 04/24/2023 CLINICAL DATA:  89 year old female with history of shortness of breath for the past 3 days. EXAM: CT ANGIOGRAPHY CHEST WITH CONTRAST TECHNIQUE: Multidetector CT imaging of the chest was performed using the standard protocol during bolus administration of intravenous contrast. Multiplanar CT image reconstructions and MIPs were obtained to evaluate the vascular  anatomy. RADIATION DOSE REDUCTION: This exam was performed according to the departmental dose-optimization program which includes automated exposure control, adjustment of the mA and/or kV according to patient size and/or use of iterative reconstruction  technique. CONTRAST:  75mL OMNIPAQUE  IOHEXOL  350 MG/ML SOLN COMPARISON:  No priors. FINDINGS: Cardiovascular: Study is slightly limited by patient respiratory motion. With these limitations in mind, there is no central, lobar or segmental sized filling defect in the pulmonary arteries to suggest clinically significant pulmonary embolism. Smaller subsegmental sized filling defects are not entirely excluded. Heart size is mildly enlarged. There is no significant pericardial fluid, thickening or pericardial calcification. There is aortic atherosclerosis, as well as atherosclerosis of the great vessels of the mediastinum and the coronary arteries, including calcified atherosclerotic plaque in the left main, left anterior descending, left circumflex and right coronary arteries. Status post median sternotomy for CABG. Mediastinum/Nodes: No pathologically enlarged mediastinal or hilar lymph nodes. Esophagus is unremarkable in appearance. Lungs/Pleura: Partial collapse of the trachea and mainstem bronchi indicative of tracheobronchomalacia. Patchy areas of ground-glass attenuation, septal thickening and scattered patchy consolidation in the lungs bilaterally, favored to reflect a background of pulmonary edema (although multilobar bilateral bronchopneumonia could have a similar appearance). No pleural effusions. Upper Abdomen: Multiple large masses are noted within the liver, largest of which in the superior aspect of the right lobe of the liver measures up to 13.8 x 6.8 cm (axial image 90 of series 10), incompletely characterized on today's noncontrast CT examination, but concerning for multifocal hepatic metastases. Musculoskeletal: Status post bilateral shoulder  arthroplasty. Median sternotomy wires. There are no aggressive appearing lytic or blastic lesions noted in the visualized portions of the skeleton. Review of the MIP images confirms the above findings. IMPRESSION: 1. Limited study demonstrating no central, lobar or segmental sized pulmonary embolism. 2. The overall appearance of the chest suggests underlying congestive heart failure, as above. 3. Tracheobronchomalacia. 4. Multiple indeterminate liver masses highly concerning for widespread metastatic disease to the liver. Further evaluation with nonemergent abdominal MRI with and without IV gadolinium should be considered to better evaluate these findings. Aortic Atherosclerosis (ICD10-I70.0). Electronically Signed   By: Toribio Aye M.D.   On: 04/24/2023 05:19   DG Chest Portable 1 View Result Date: 04/24/2023 CLINICAL DATA:  Shortness of breath EXAM: PORTABLE CHEST 1 VIEW COMPARISON:  01/30/2022 FINDINGS: Low lung volumes accentuate cardiomediastinal silhouette and pulmonary vascularity. Bilateral mid and lower lung airspace opacities. No definite pleural effusion or pneumothorax. Sternotomy and CABG. Aortic atherosclerotic calcification. Bilateral reverse TSH. IMPRESSION: Hypoinflation. Bilateral mid and lower lung airspace opacities, atelectasis versus pneumonia. Electronically Signed   By: Norman Gatlin M.D.   On: 04/24/2023 02:14    Assessment/Plan  Acute hypoxemic respiratory failure in the setting of RSV bronchitis/pneumonia and CHF exacerbation: -Currently on nasal cannula.  Reviewed chest x-ray and CTA chest.  No PE concerning for CHF -Admit patient in stepdown.  On continuous pulse ox.   -Received Rocephin  and azithromycin , Solu-Medrol  and Lasix  in ED. -Will check procalcitonin level and continue antibiotics as well as Lasix .  Blood culture is pending. -Strict INO's and daily weight. -Will get echocardiogram.  Liver lesion: -Concern for widespread metastatic disease. -I discussed  with patient's daughter on the phone who is going to discuss this with the patient and will update us  regarding their wishes to proceed with further investigations or not.  Type 2 diabetes: Will check A1c  -Diet controlled.  Dementia Mood disorder: -Will continue home medications once she passed swallow evaluation -She is on Aricept , Seroquel , risperidone  and Namenda  at home.  Hypertension: Stable -Will continue amlodipine  and Coreg    GERD: Stable on PPI  CAD status post CABG: -Patient has no ACS symptoms.  Troponin  x 2 negative.  Will continue aspirin , beta-blocker  Chronic pain: Will continue gabapentin   Thrombocytopenia: Will monitor  I will hold on her p.o. home medications until she passes swallow evaluation.  DVT prophylaxis: Lovenox  Code Status: DNR/DNI confirmed with patient's daughter on the phone Family Communication: None present at bedside.    I called patient's daughter Dr. Cleotilde on the phone and we discussed plan of care  Disposition Plan: To be determined Consults called: None Admission status: In patient   Velna JONELLE Skeeter MD [facility]  If 7PM-7AM, please contact night-coverage [email or web address]  04/24/2023, 9:32 AM

## 2023-04-24 NOTE — Progress Notes (Signed)
 No resp distress, pt on [place] saturation 100%. No new orders.

## 2023-04-24 NOTE — Evaluation (Signed)
 Clinical/Bedside Swallow Evaluation Patient Details  Name: [name] MRN: [id number] Date of Birth: May 02, 1930  Today's Date: 04/24/2023 Time: SLP Start Time (ACUTE ONLY): 1045 SLP Stop Time (ACUTE ONLY): 1100 SLP Time Calculation (min) (ACUTE ONLY): 15 min  Past Medical History:  Past Medical History:  Diagnosis Date   Acute cystitis without hematuria 02/04/2022   Acute metabolic encephalopathy 01/31/2022   Acute respiratory failure with hypoxia (HCC) 01/30/2022   AKI (acute kidney injury) (HCC) 01/31/2022   CAD, NATIVE VESSEL 06/09/2008   Dehydration 01/30/2022   Delirium with dementia 01/08/2022   DIABETES MELLITUS, TYPE II    Encounter for testing for latent tuberculosis 01/13/2022   FIBROMYALGIA    GERD    Gout    HIATAL HERNIA    HYPERLIPIDEMIA    HYPERTENSION    Hypokalemia 01/31/2022   Insomnia    OA (osteoarthritis)    severe, shoulders, hands - inflammatory, ?RA   Pressure ulcer of heel, right, unstageable (HCC) 06/12/2022   S/P CABG x 1    S/P inguinal hernia repair    TUBULOVILLOUS ADENOMA, 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   colo 04/2012 - no polyps - no further screening planned (age >68)   UNSPECIFIED ANEMIA    Wrist pain, acute, right 03/18/2022   Past Surgical History:  Past Surgical History:  Procedure Laterality Date   ABDOMINAL HYSTERECTOMY  1981   APPENDECTOMY     CATARACT EXTRACTION, BILATERAL     CORONARY ARTERY BYPASS GRAFT  2010   HERNIA REPAIR     INGUINAL   I & D EXTREMITY Left 12/07/2017   Procedure: IRRIGATION AND DEBRIDEMENT EXTREMITY WITH FOREIGN BODY REMOVAL;  Surgeon: Sharl Selinda Dover, MD;  Location: [facility] OR;  Service: Orthopedics;  Laterality: Left;   JOINT REPLACEMENT     OVARIAN CYST REMOVAL     REPLACEMENT TOTAL KNEE BILATERAL Bilateral    Rt-1974, Lt -1989   REVERSE SHOULDER ARTHROPLASTY  09/08/2011   Procedure: REVERSE SHOULDER ARTHROPLASTY;  Surgeon: Eva Elsie Herring, MD;  Location: [facility] OR;  Service: Orthopedics;  Laterality:  Left;  left reverse total shoulder arthroplasty   ROTATOR CUFF REPAIR     Left   HPI:  89 y.o. female with medical history significant of dementia, hypertension, depression, CAD status post CABG, hyperlipidemia, type 2 diabetes, fibromyalgia presented with shortness of breath since 3 days.  EMS was called and found to have low oxygen saturation and was placed on CPAP on arrival. Respiratory panel positive for RSV. Chest x-ray concerning for bilateral lower lung airspace opacities atelectasis versus pneumonia.    Assessment / Plan / Recommendation  Clinical Impression  Pt presents with a mild oral dysphagia. Note missing dentition (largely mollars) impairing ability to timely and efficiently masticate solids. Pt also with hx of dementia. She was very pleasant and cooperative but appears deconditioned. No overt s/sx of aspiration with any POs. Recommend dysphagia 3 (mechanical soft) and thin liquids with meds as tolerated. SLP to follow up for diet tolerance.  SLP Visit Diagnosis: Dysphagia, oral phase (R13.11)    Aspiration Risk  Mild aspiration risk    Diet Recommendation   Thin;Dysphagia 3 (mechanical soft)  Medication Administration: Whole meds with liquid    Other  Recommendations Oral Care Recommendations: Oral care BID    Recommendations for follow up therapy are one component of a multi-disciplinary discharge planning process, led by the attending physician.  Recommendations may be updated based on patient status, additional functional criteria and insurance authorization.  Follow up Recommendations  Follow physician's recommendations for discharge plan and follow up therapies      Assistance Recommended at Discharge    Functional Status Assessment    Frequency and Duration min 1 x/week          Prognosis Prognosis for improved oropharyngeal function: Good Barriers to Reach Goals: Time post onset      Swallow Study   General Date of Onset: 04/24/23 HPI: 89 y.o. female  with medical history significant of dementia, hypertension, depression, CAD status post CABG, hyperlipidemia, type 2 diabetes, fibromyalgia presented with shortness of breath since 3 days.  EMS was called and found to have low oxygen saturation and was placed on CPAP on arrival. Respiratory panel positive for RSV. Chest x-ray concerning for bilateral lower lung airspace opacities atelectasis versus pneumonia. Type of Study: Bedside Swallow Evaluation Previous Swallow Assessment: none on file Diet Prior to this Study: NPO Temperature Spikes Noted: No Respiratory Status: Room air History of Recent Intubation: No Behavior/Cognition: Alert;Pleasant mood;Requires cueing Oral Cavity Assessment: Dry Oral Care Completed by SLP: Yes Oral Cavity - Dentition: Missing dentition Vision: Impaired for self-feeding Self-Feeding Abilities: Needs assist Patient Positioning: Partially reclined Baseline Vocal Quality: Low vocal intensity Volitional Cough: Cognitively unable to elicit Volitional Swallow: Able to elicit    Oral/Motor/Sensory Function Overall Oral Motor/Sensory Function: Generalized oral weakness   Ice Chips Ice chips: Within functional limits   Thin Liquid Thin Liquid: Within functional limits Presentation: Cup;Straw    Nectar Thick Nectar Thick Liquid: Not tested   Honey Thick Honey Thick Liquid: Not tested   Puree Puree: Within functional limits   Solid     Solid: Impaired Presentation: Spoon Oral Phase Functional Implications: Prolonged oral transit Pharyngeal Phase Impairments: Multiple swallows      Joletta Manner H. MA, CCC-SLP Acute Rehabilitation Services   04/24/2023,11:31 AM

## 2023-04-25 DIAGNOSIS — J9601 Acute respiratory failure with hypoxia: Secondary | ICD-10-CM | POA: Diagnosis not present

## 2023-04-25 LAB — COMPREHENSIVE METABOLIC PANEL
ALT: 16 U/L (ref 0–44)
AST: 21 U/L (ref 15–41)
Albumin: 3.6 g/dL (ref 3.5–5.0)
Alkaline Phosphatase: 67 U/L (ref 38–126)
Anion gap: 11 (ref 5–15)
BUN: 19 mg/dL (ref 8–23)
CO2: 28 mmol/L (ref 22–32)
Calcium: 9.1 mg/dL (ref 8.9–10.3)
Chloride: 103 mmol/L (ref 98–111)
Creatinine, Ser: 0.6 mg/dL (ref 0.44–1.00)
GFR, Estimated: >60 mL/min (ref >60)
Glucose, Bld: 131 mg/dL — ABNORMAL HIGH (ref 70–99)
Potassium: 3.3 mmol/L — ABNORMAL LOW (ref 3.5–5.1)
Sodium: 142 mmol/L (ref 135–145)
Total Bilirubin: 0.8 mg/dL (ref 0.0–1.2)
Total Protein: 7.1 g/dL (ref 6.5–8.1)

## 2023-04-25 LAB — CBC
HCT: 40.9 % (ref 36.0–46.0)
Hemoglobin: 12.9 g/dL (ref 12.0–15.0)
MCH: 30.6 pg (ref 26.0–34.0)
MCHC: 31.5 g/dL (ref 30.0–36.0)
MCV: 96.9 fL (ref 80.0–100.0)
Platelets: 166 10*3/uL (ref 150–400)
RBC: 4.22 MIL/uL (ref 3.87–5.11)
RDW: 13.2 % (ref 11.5–15.5)
WBC: 8.2 10*3/uL (ref 4.0–10.5)
nRBC: 0 % (ref 0.0–0.2)

## 2023-04-25 MED ORDER — FUROSEMIDE 10 MG/ML IJ SOLN
40.0000 mg | Freq: Two times a day (BID) | INTRAMUSCULAR | Status: DC
  Administered 2023-04-26 (×2): 40 mg via INTRAVENOUS
  Filled 2023-04-25 (×2): qty 4

## 2023-04-25 MED ORDER — POTASSIUM CHLORIDE 10 MEQ/100ML IV SOLN
10.0000 meq | INTRAVENOUS | Status: AC
  Administered 2023-04-25 (×3): 10 meq via INTRAVENOUS
  Filled 2023-04-25 (×3): qty 100

## 2023-04-25 MED ORDER — AMLODIPINE BESYLATE 5 MG PO TABS
5.0000 mg | ORAL_TABLET | Freq: Two times a day (BID) | ORAL | Status: DC
  Administered 2023-04-25 – 2023-04-29 (×9): 5 mg via ORAL
  Filled 2023-04-25 (×9): qty 1

## 2023-04-25 MED ORDER — HYDRALAZINE HCL 20 MG/ML IJ SOLN
10.0000 mg | Freq: Four times a day (QID) | INTRAMUSCULAR | Status: DC | PRN
  Administered 2023-04-25 – 2023-04-26 (×2): 10 mg via INTRAVENOUS
  Filled 2023-04-25 (×2): qty 1

## 2023-04-25 MED ORDER — QUETIAPINE FUMARATE 25 MG PO TABS
50.0000 mg | ORAL_TABLET | Freq: Every day | ORAL | Status: DC
  Administered 2023-04-26 – 2023-04-28 (×4): 50 mg via ORAL
  Filled 2023-04-25: qty 1
  Filled 2023-04-25: qty 2
  Filled 2023-04-25 (×3): qty 1

## 2023-04-25 MED ORDER — BUSPIRONE HCL 5 MG PO TABS
15.0000 mg | ORAL_TABLET | Freq: Two times a day (BID) | ORAL | Status: DC
  Administered 2023-04-25 – 2023-04-29 (×9): 15 mg via ORAL
  Filled 2023-04-25 (×3): qty 1
  Filled 2023-04-25: qty 3
  Filled 2023-04-25 (×2): qty 1
  Filled 2023-04-25: qty 3
  Filled 2023-04-25 (×2): qty 1

## 2023-04-25 MED ORDER — HALOPERIDOL LACTATE 5 MG/ML IJ SOLN
2.0000 mg | Freq: Once | INTRAMUSCULAR | Status: AC
  Administered 2023-04-25: 2 mg via INTRAVENOUS

## 2023-04-25 MED ORDER — DONEPEZIL HCL 10 MG PO TABS
10.0000 mg | ORAL_TABLET | Freq: Every day | ORAL | Status: DC
  Administered 2023-04-26 – 2023-04-28 (×4): 10 mg via ORAL
  Filled 2023-04-25 (×4): qty 1

## 2023-04-25 MED ORDER — POTASSIUM CHLORIDE CRYS ER 20 MEQ PO TBCR
40.0000 meq | EXTENDED_RELEASE_TABLET | Freq: Once | ORAL | Status: AC
  Administered 2023-04-25: 40 meq via ORAL
  Filled 2023-04-25: qty 2

## 2023-04-25 MED ORDER — PANTOPRAZOLE SODIUM 40 MG PO TBEC
40.0000 mg | DELAYED_RELEASE_TABLET | Freq: Every day | ORAL | Status: DC
  Administered 2023-04-25 – 2023-04-29 (×5): 40 mg via ORAL
  Filled 2023-04-25 (×5): qty 1

## 2023-04-25 MED ORDER — TRAMADOL HCL 50 MG PO TABS
50.0000 mg | ORAL_TABLET | Freq: Three times a day (TID) | ORAL | Status: DC | PRN
  Administered 2023-04-28 – 2023-04-29 (×3): 50 mg via ORAL
  Filled 2023-04-25 (×4): qty 1

## 2023-04-25 MED ORDER — AZITHROMYCIN 250 MG PO TABS
500.0000 mg | ORAL_TABLET | Freq: Once | ORAL | Status: AC
  Administered 2023-04-26: 500 mg via ORAL
  Filled 2023-04-25: qty 2

## 2023-04-25 MED ORDER — HYDRALAZINE HCL 20 MG/ML IJ SOLN
10.0000 mg | Freq: Once | INTRAMUSCULAR | Status: AC
  Administered 2023-04-25: 10 mg via INTRAVENOUS
  Filled 2023-04-25: qty 1

## 2023-04-25 MED ORDER — FUROSEMIDE 10 MG/ML IJ SOLN
40.0000 mg | Freq: Once | INTRAMUSCULAR | Status: AC
  Administered 2023-04-25: 40 mg via INTRAVENOUS
  Filled 2023-04-25: qty 4

## 2023-04-25 MED ORDER — METHYLPREDNISOLONE SODIUM SUCC 40 MG IJ SOLR
40.0000 mg | Freq: Two times a day (BID) | INTRAMUSCULAR | Status: DC
  Administered 2023-04-25 – 2023-04-27 (×4): 40 mg via INTRAVENOUS
  Filled 2023-04-25 (×4): qty 1

## 2023-04-25 MED ORDER — IPRATROPIUM-ALBUTEROL 0.5-2.5 (3) MG/3ML IN SOLN: 3.0000 mL | RESPIRATORY_TRACT | Status: DC | PRN

## 2023-04-25 MED ORDER — MEMANTINE HCL 10 MG PO TABS
10.0000 mg | ORAL_TABLET | Freq: Two times a day (BID) | ORAL | Status: DC
  Administered 2023-04-25 – 2023-04-29 (×8): 10 mg via ORAL
  Filled 2023-04-25: qty 2
  Filled 2023-04-25: qty 1
  Filled 2023-04-25 (×2): qty 2
  Filled 2023-04-25: qty 1
  Filled 2023-04-25 (×3): qty 2

## 2023-04-25 MED ORDER — RISPERIDONE 0.5 MG PO TABS
0.5000 mg | ORAL_TABLET | Freq: Two times a day (BID) | ORAL | Status: DC
  Administered 2023-04-25 – 2023-04-29 (×9): 0.5 mg via ORAL
  Filled 2023-04-25 (×9): qty 1

## 2023-04-25 MED ORDER — CARVEDILOL 6.25 MG PO TABS
6.2500 mg | ORAL_TABLET | Freq: Two times a day (BID) | ORAL | Status: DC
  Administered 2023-04-25 – 2023-04-29 (×9): 6.25 mg via ORAL
  Filled 2023-04-25 (×9): qty 1

## 2023-04-25 NOTE — Progress Notes (Addendum)
 PROGRESS NOTE    [name]  FMW:[id number] DOB: [birth year] DOA: 04/24/2023 PCP: Geofm Glade PARAS, MD    Brief Narrative:   [name] is a 89 y.o. female with medical history significant of dementia, hypertension, depression, CAD status post CABG, hyperlipidemia, type 2 diabetes, fibromyalgia presented to hospital with shortness of breath for 3 days with leg swelling.[name]  EMS noted that she had low pulse ox and was put on CPAP.  In the ED, patient was tachypneic and was on nasal cannula oxygen.  Initial labs showed no leukocytosis but with mild thrombocytopenia.  Respiratory viral panel was positive for RSV.  Troponins negative.  Chest x-ray showed bilateral lower lung opacities atelectasis versus pneumonia.  CT chest showed no PE but concerning for congestive heart failure tracheomalacia and multiple liver masses concerning for metastatic disease to the liver.  Patient was given IV Rocephin , Zithromax , DuoNebs, prednisone  and Lasix  20 and [facility] hospitalist consulted for admission  Assessment plan.  Acute hypoxemic respiratory failure in the setting of RSV bronchitis/pneumonia and CHF exacerbation: -Initially required CPAP.  Continue supplemental oxygen.  Chest x-ray and CTA without any PE but infiltrates.  Initially received Rocephin  and Zithromax  and Solu-Medrol  with Lasix  in the ED. currently on IV Lasix  40 mg twice daily.  As per the daughter, scheduled to see cardiology as outpatient.  Afebrile.  Blood cultures pending.  MRSA PCR negative.  No leukocytosis today.  Procalcitonin less than 0.10.  Strict intake and output charting, daily weights.   2D echocardiogram with preserved EF at 65 percent.  BNP elevated at 107 will reassess for diuretic need in AM.  Might benefit from low-dose diuretic on discharge.  Hypokalemia.  Will replace with oral and IV today..  Check levels in AM.  Monitor closely while on diuretics.   Liver lesion: -Concern for widespread metastatic disease.   Spoke with the patient's daughter on the phone and she stated that her mother would not want to be worked up for this.   Type 2 diabetes mellitus:  Will check A1c  -Diet controlled at home.   Dementia/Mood disorder with mild agitation.:  on Aricept , Seroquel , risperidone  and Namenda  at home.  Will resume.  Patient is currently at assisted living facility memory care unit.  She does have baseline confusion as per the patient's daughter.  As per the daughter, patient takes risperidone  for it.  Needed Haldol  x 1 yesterday.  Currently on mittens.   Hypertension: Continue amlodipine  and Coreg .  Has been elevated in the hospital.  Will add as needed hydralazine .   GERD:  Continue PPI   CAD status post CABG: No acute issues.  Continue aspirin  beta-blocker   Chronic pain: Will continue gabapentin    Thrombocytopenia: Will monitor, check CBC in AM.      DVT prophylaxis: enoxaparin  (LOVENOX ) injection 40 mg Start: 04/24/23 1000 SCDs Start: 04/24/23 9070   Code Status:     Code Status: Limited: Do not attempt resuscitation (DNR) -DNR-LIMITED -Do Not Intubate/DNI   Disposition: ALF with memory place likely tomorrow if remains stable..  Status is: Inpatient Remains inpatient appropriate because: IV diuretics, pending clinical improvement.   Family Communication:   I spoke with the patient's daughter who is also the health power of attorney Dr. Cleotilde on the phone and updated her about the clinical condition of the patient.  She does not wish to her mom to undergo further testing for liver masses and wishes her to come back to her assisted living facility when  stable.  Consultants:  None  Procedures:  None  Antimicrobials:  None  Anti-infectives (From admission, onward)    Start     Dose/Rate Route Frequency Ordered Stop   04/26/23 1000  azithromycin  (ZITHROMAX ) tablet 500 mg        500 mg Oral  Once 04/25/23 1151     04/25/23 0300  azithromycin  (ZITHROMAX ) 500 mg in sodium  chloride 0.9 % 250 mL IVPB  Status:  Discontinued        500 mg 250 mL/hr over 60 Minutes Intravenous Every 24 hours 04/24/23 0947 04/25/23 1151   04/25/23 0300  cefTRIAXone  (ROCEPHIN ) 1 g in sodium chloride  0.9 % 100 mL IVPB        1 g 200 mL/hr over 30 Minutes Intravenous Every 24 hours 04/24/23 0950     04/24/23 0245  cefTRIAXone  (ROCEPHIN ) 1 g in sodium chloride  0.9 % 100 mL IVPB        1 g 200 mL/hr over 30 Minutes Intravenous  Once 04/24/23 0244 04/24/23 0336   04/24/23 0245  azithromycin  (ZITHROMAX ) 500 mg in sodium chloride  0.9 % 250 mL IVPB        500 mg 250 mL/hr over 60 Minutes Intravenous  Once 04/24/23 0244 04/24/23 0501        Subjective: Today, patient was seen and examined at bedside.  Well-appearing, confused and disoriented, as per the nursing staff was confused and on mittens.  Trying to get out of the bed.  Coughing some.  Denies pain.  Objective: Vitals:   04/25/23 1000 04/25/23 1100 04/25/23 1200 04/25/23 1329  BP:  108/66 118/72   Pulse: 84     Resp: 19 (!) 23 17   Temp:    97.8 F (36.6 C)  TempSrc:    Oral  SpO2: 90%     Weight:      Height:        Intake/Output Summary (Last 24 hours) at 04/25/2023 1354 Last data filed at 04/25/2023 1153 Gross per 24 hour  Intake 557.76 ml  Output 950 ml  Net -392.24 ml   Filed Weights   04/24/23 0119 04/25/23 0500  Weight: 60 kg 60 kg    Physical Examination: Body mass index is 26.72 kg/m.   General:  Average built, not in obvious distress, elderly female, confused and disoriented, on mittens HENT:   No scleral pallor or icterus noted. Oral mucosa is moist.  Chest:  .  Diminished breath sounds bilaterally. CVS: S1 &S2 heard. No murmur.  Regular rate and rhythm. Abdomen: Soft, nontender, nondistended.  Bowel sounds are heard.   Extremities: No cyanosis, clubbing or edema.  Peripheral pulses are palpable. Psych: Alert, awake and confused and disoriented, on mittens, has underlying dementia CNS:  No cranial  nerve deficits.  Moves all extremities Skin: Warm and dry.  No rashes noted.  Data Reviewed:   CBC: Recent Labs  Lab 04/24/23 0144 04/24/23 1307 04/25/23 0348  WBC 5.1 5.5 8.2  NEUTROABS 2.7  --   --   HGB 13.0 13.5 12.9  HCT 42.2 43.3 40.9  MCV 100.0 98.9 96.9  PLT 144* 164 166    Basic Metabolic Panel: Recent Labs  Lab 04/24/23 0144 04/24/23 1028 04/25/23 0348  NA 142  --  142  K 3.9  --  3.3*  CL 109  --  103  CO2 25  --  28  GLUCOSE 145*  --  131*  BUN 27*  --  19  CREATININE 0.80  0.70 0.60  CALCIUM  8.5*  --  9.1  MG  --  2.2  --   PHOS  --  4.2  --     Liver Function Tests: Recent Labs  Lab 04/24/23 0646 04/25/23 0348  AST 22 21  ALT 14 16  ALKPHOS 66 67  BILITOT 0.9 0.8  PROT 7.3 7.1  ALBUMIN 3.7 3.6     Radiology Studies: ECHOCARDIOGRAM COMPLETE Result Date: 04/24/2023    ECHOCARDIOGRAM REPORT   Patient Name:   [facility] Mach[facility] Date of Exam: 04/24/2023 Medical Rec #:  [id number]           Height:       59.0 in Accession #:    [id number]          Weight:       132.3 lb Date of Birth:  04/30/1930           BSA:          1.547 m Patient Age:    89 years            BP:           142/80 mmHg Patient Gender: F                   HR:           77 bpm. Exam Location:  Inpatient Procedure: 2D Echo, Cardiac Doppler and Color Doppler Indications:    Congenital Heart Disease Q24.0  History:        Patient has prior history of Echocardiogram examinations and                 Patient has no prior history of Echocardiogram examinations.                 CAD, Prior CABG; Risk Factors:Diabetes.  Sonographer:    Jayson Gaskins Referring Phys: [id number] VELNA SAUNDERS PAHWANI IMPRESSIONS  1. Left ventricular ejection fraction, by estimation, is 65 to 70%. The left ventricle has normal function. The left ventricle has no regional wall motion abnormalities. Left ventricular diastolic parameters are consistent with Grade I diastolic dysfunction (impaired relaxation).  2. RV-RA gradient 27 mmHg  suggests normal to mildly increased estimated RVSP depending on CVP. Right ventricular systolic function is normal. The right ventricular size is mildly enlarged.  3. Left atrial size was mildly dilated.  4. The mitral valve is degenerative. Trivial mitral valve regurgitation.  5. The aortic valve is tricuspid. There is mild calcification of the aortic valve. Aortic valve regurgitation is not visualized. Aortic valve sclerosis/calcification is present, without any evidence of aortic stenosis. Aortic valve mean gradient measures 4.0 mmHg.  6. Unable to estimate CVP. Comparison(s): No prior Echocardiogram. FINDINGS  Left Ventricle: Left ventricular ejection fraction, by estimation, is 65 to 70%. The left ventricle has normal function. The left ventricle has no regional wall motion abnormalities. The left ventricular internal cavity size was normal in size. There is  no left ventricular hypertrophy. Left ventricular diastolic parameters are consistent with Grade I diastolic dysfunction (impaired relaxation). Right Ventricle: RV-RA gradient 27 mmHg suggests normal to mildly increased estimated RVSP depending on CVP. The right ventricular size is mildly enlarged. No increase in right ventricular wall thickness. Right ventricular systolic function is normal. Left Atrium: Left atrial size was mildly dilated. Right Atrium: Right atrial size was normal in size. Pericardium: There is no evidence of pericardial effusion. Mitral Valve: The mitral valve is degenerative in appearance. There is mild  calcification of the mitral valve leaflet(s). Mild mitral annular calcification. Trivial mitral valve regurgitation. Tricuspid Valve: The tricuspid valve is grossly normal. Tricuspid valve regurgitation is trivial. Aortic Valve: The aortic valve is tricuspid. There is mild calcification of the aortic valve. There is mild aortic valve annular calcification. Aortic valve regurgitation is not visualized. Aortic valve  sclerosis/calcification is present, without any evidence of aortic stenosis. Aortic valve mean gradient measures 4.0 mmHg. Aortic valve peak gradient measures 7.7 mmHg. Aortic valve area, by VTI measures 2.04 cm. Pulmonic Valve: The pulmonic valve was grossly normal. Pulmonic valve regurgitation is trivial. Aorta: The aortic root is normal in size and structure. Venous: Unable to estimate CVP. The inferior vena cava was not well visualized. IAS/Shunts: No atrial level shunt detected by color flow Doppler.  LEFT VENTRICLE PLAX 2D LVIDd:         3.10 cm   Diastology LVIDs:         2.30 cm   LV e' medial:    4.03 cm/s LV PW:         0.90 cm   LV E/e' medial:  20.2 LV IVS:        0.90 cm   LV e' lateral:   5.77 cm/s LVOT diam:     1.80 cm   LV E/e' lateral: 14.1 LV SV:         62 LV SV Index:   40 LVOT Area:     2.54 cm  RIGHT VENTRICLE RV S prime:     8.70 cm/s TAPSE (M-mode): 1.6 cm LEFT ATRIUM             Index        RIGHT ATRIUM           Index LA Vol (A2C):   69.7 ml 45.05 ml/m  RA Area:     16.00 cm LA Vol (A4C):   43.0 ml 27.79 ml/m  RA Volume:   38.60 ml  24.95 ml/m LA Biplane Vol: 58.9 ml 38.07 ml/m  AORTIC VALVE AV Area (Vmax):    2.01 cm AV Area (Vmean):   1.94 cm AV Area (VTI):     2.04 cm AV Vmax:           139.00 cm/s AV Vmean:          97.300 cm/s AV VTI:            0.303 m AV Peak Grad:      7.7 mmHg AV Mean Grad:      4.0 mmHg LVOT Vmax:         110.00 cm/s LVOT Vmean:        74.200 cm/s LVOT VTI:          0.243 m LVOT/AV VTI ratio: 0.80  AORTA Ao Root diam: 2.60 cm MITRAL VALVE                TRICUSPID VALVE MV Area (PHT): 2.46 cm     TR Peak grad:   27.2 mmHg MV Decel Time: 308 msec     TR Vmax:        261.00 cm/s MV E velocity: 81.40 cm/s MV A velocity: 114.00 cm/s  SHUNTS MV E/A ratio:  0.71         Systemic VTI:  0.24 m                             Systemic Diam: 1.80  cm Jayson Sierras MD Electronically signed by Jayson Sierras MD Signature Date/Time: 04/24/2023/5:23:52 PM    Final     CT Angio Chest PE W and/or Wo Contrast Result Date: 04/24/2023 CLINICAL DATA:  89 year old female with history of shortness of breath for the past 3 days. EXAM: CT ANGIOGRAPHY CHEST WITH CONTRAST TECHNIQUE: Multidetector CT imaging of the chest was performed using the standard protocol during bolus administration of intravenous contrast. Multiplanar CT image reconstructions and MIPs were obtained to evaluate the vascular anatomy. RADIATION DOSE REDUCTION: This exam was performed according to the departmental dose-optimization program which includes automated exposure control, adjustment of the mA and/or kV according to patient size and/or use of iterative reconstruction technique. CONTRAST:  75mL OMNIPAQUE  IOHEXOL  350 MG/ML SOLN COMPARISON:  No priors. FINDINGS: Cardiovascular: Study is slightly limited by patient respiratory motion. With these limitations in mind, there is no central, lobar or segmental sized filling defect in the pulmonary arteries to suggest clinically significant pulmonary embolism. Smaller subsegmental sized filling defects are not entirely excluded. Heart size is mildly enlarged. There is no significant pericardial fluid, thickening or pericardial calcification. There is aortic atherosclerosis, as well as atherosclerosis of the great vessels of the mediastinum and the coronary arteries, including calcified atherosclerotic plaque in the left main, left anterior descending, left circumflex and right coronary arteries. Status post median sternotomy for CABG. Mediastinum/Nodes: No pathologically enlarged mediastinal or hilar lymph nodes. Esophagus is unremarkable in appearance. Lungs/Pleura: Partial collapse of the trachea and mainstem bronchi indicative of tracheobronchomalacia. Patchy areas of ground-glass attenuation, septal thickening and scattered patchy consolidation in the lungs bilaterally, favored to reflect a background of pulmonary edema (although multilobar bilateral  bronchopneumonia could have a similar appearance). No pleural effusions. Upper Abdomen: Multiple large masses are noted within the liver, largest of which in the superior aspect of the right lobe of the liver measures up to 13.8 x 6.8 cm (axial image 90 of series 10), incompletely characterized on today's noncontrast CT examination, but concerning for multifocal hepatic metastases. Musculoskeletal: Status post bilateral shoulder arthroplasty. Median sternotomy wires. There are no aggressive appearing lytic or blastic lesions noted in the visualized portions of the skeleton. Review of the MIP images confirms the above findings. IMPRESSION: 1. Limited study demonstrating no central, lobar or segmental sized pulmonary embolism. 2. The overall appearance of the chest suggests underlying congestive heart failure, as above. 3. Tracheobronchomalacia. 4. Multiple indeterminate liver masses highly concerning for widespread metastatic disease to the liver. Further evaluation with nonemergent abdominal MRI with and without IV gadolinium should be considered to better evaluate these findings. Aortic Atherosclerosis (ICD10-I70.0). Electronically Signed   By: Toribio Aye M.D.   On: 04/24/2023 05:19   DG Chest Portable 1 View Result Date: 04/24/2023 CLINICAL DATA:  Shortness of breath EXAM: PORTABLE CHEST 1 VIEW COMPARISON:  01/30/2022 FINDINGS: Low lung volumes accentuate cardiomediastinal silhouette and pulmonary vascularity. Bilateral mid and lower lung airspace opacities. No definite pleural effusion or pneumothorax. Sternotomy and CABG. Aortic atherosclerotic calcification. Bilateral reverse TSH. IMPRESSION: Hypoinflation. Bilateral mid and lower lung airspace opacities, atelectasis versus pneumonia. Electronically Signed   By: Norman Gatlin M.D.   On: 04/24/2023 02:14      LOS: 1 day    Vernal Alstrom, MD [facility] Available via Epic secure chat 7am-7pm After these hours, please refer to coverage  provider listed on [email or web address] 04/25/2023, 1:54 PM

## 2023-04-25 NOTE — Evaluation (Signed)
 Physical Therapy Evaluation Patient Details Name: [name] MRN: [id number] DOB: 07-Jan-1931 Today's Date: 04/25/2023  History of Present Illness  89 y.o. brought to ED with c/o SOB. admitted with acute  hypoxemic respiratory failure in the setting of RSV bronchitis/pneumonia and CHF exacerbation emale PMH: dementia, hypertension, depression, CAD status post CABG, hyperlipidemia, type 2 diabetes, fibromyalgia  Clinical Impression  Pt admitted with above diagnosis.  Pt pleasantly confused, cooperative with PT, follows one step commands consistently. Pt min assist overall this session for bed mobility and transfers.  Pt with desaturation to 85% on 3L O2with mobility, otherwise VSS--RR 18, SpO2=94% at rest, HR 71-80 . Pt left in chair with alarm in place (telesitter also), waist restraint and bil mittens replaced.  Anticipate pt will be able to return to ALF with HHPT.  Pt currently with functional limitations due to the deficits listed below (see PT Problem List). Pt will benefit from acute skilled PT to increase their independence and safety with mobility to allow discharge.           If plan is discharge home, recommend the following: A little help with walking and/or transfers;A little help with bathing/dressing/bathroom;Help with stairs or ramp for entrance;Supervision due to cognitive status;Assist for transportation   Can travel by private vehicle        Equipment Recommendations None recommended by PT  Recommendations for Other Services       Functional Status Assessment Patient has had a recent decline in their functional status and demonstrates the ability to make significant improvements in function in a reasonable and predictable amount of time.     Precautions / Restrictions Precautions Precautions: Fall Restrictions Weight Bearing Restrictions Per Provider Order: No      Mobility  Bed Mobility Overal bed mobility: Needs Assistance Bed Mobility: Supine to  Sit     Supine to sit: Min assist     General bed mobility comments: light assist to progress LEs off bed and elevate trunk    Transfers Overall transfer level: Needs assistance Equipment used: Rolling walker (2 wheels) Transfers: Sit to/from Stand, Bed to chair/wheelchair/BSC Sit to Stand: Min assist   Step pivot transfers: Min assist       General transfer comment: assist to rise and transition to stand, assist to steady for pivot to chair, HHA on L side    Ambulation/Gait               General Gait Details: deferred d/t decr sats and +1 assist with pt in ICU, multiple lines  Stairs            Wheelchair Mobility     Tilt Bed    Modified Rankin (Stroke Patients Only)       Balance Overall balance assessment: Needs assistance Sitting-balance support: Feet supported, No upper extremity supported Sitting balance-Leahy Scale: Fair     Standing balance support: Single extremity supported Standing balance-Leahy Scale: Poor                               Pertinent Vitals/Pain Pain Assessment Pain Assessment: No/denies pain    Home Living Family/patient expects to be discharged to:: Assisted living                   Additional Comments: from [facility] per chart    Prior Function Prior Level of Function : Patient poor historian/Family not available  Mobility Comments: per previous notes pt amb with RW       Extremity/Trunk Assessment   Upper Extremity Assessment Upper Extremity Assessment: Defer to OT evaluation    Lower Extremity Assessment Lower Extremity Assessment: Overall WFL for tasks assessed    Cervical / Trunk Assessment Cervical / Trunk Assessment: Normal  Communication   Communication Communication: No apparent difficulties  Cognition Arousal: Alert Behavior During Therapy: WFL for tasks assessed/performed Overall Cognitive Status: History of cognitive impairments - at baseline                                  General Comments: pt is pleasant and cooperative, follows one step commands        General Comments      Exercises     Assessment/Plan    PT Assessment Patient needs continued PT services  PT Problem List Decreased activity tolerance;Decreased balance;Decreased mobility;Cardiopulmonary status limiting activity;Decreased cognition       PT Treatment Interventions DME instruction;Gait training;Functional mobility training;Therapeutic activities;Therapeutic exercise;Patient/family education    PT Goals (Current goals can be found in the Care Plan section)  Acute Rehab PT Goals PT Goal Formulation: Patient unable to participate in goal setting Time For Goal Achievement: 05/09/23 Potential to Achieve Goals: Good    Frequency       Co-evaluation               AM-PAC PT 6 Clicks Mobility  Outcome Measure Help needed turning from your back to your side while in a flat bed without using bedrails?: A Little Help needed moving from lying on your back to sitting on the side of a flat bed without using bedrails?: A Little Help needed moving to and from a bed to a chair (including a wheelchair)?: A Little Help needed standing up from a chair using your arms (e.g., wheelchair or bedside chair)?: A Little Help needed to walk in hospital room?: A Little Help needed climbing 3-5 steps with a railing? : A Lot 6 Click Score: 17    End of Session Equipment Utilized During Treatment: Gait belt Activity Tolerance: Patient tolerated treatment well Patient left: in chair;with call bell/phone within reach;with chair alarm set Nurse Communication: Mobility status PT Visit Diagnosis: Other abnormalities of gait and mobility (R26.89)    Time: 8769-8744 PT Time Calculation (min) (ACUTE ONLY): 25 min   Charges:   PT Evaluation $PT Eval Low Complexity: 1 Low PT Treatments $Therapeutic Activity: 8-22 mins PT General Charges $$ ACUTE PT  VISIT: 1 Visit         Rexene, PT  Acute Rehab Dept [facility]) [phone]  04/25/2023   [facility] 04/25/2023, 1:11 PM

## 2023-04-25 NOTE — Plan of Care (Signed)

## 2023-04-25 NOTE — Hospital Course (Signed)
 [name] is a 89 y.o. female with medical history significant of dementia, hypertension, depression, CAD status post CABG, hyperlipidemia, type 2 diabetes, fibromyalgia presented to hospital with shortness of breath for 3 days with leg swelling.[name]  EMS noted that she had low pulse ox and was put on CPAP.  In the ED patient was tachypneic and was on nasal cannula oxygen.  Initial labs showed no leukocytosis but with mild thrombocytopenia.  Respiratory viral panel was positive for RSV.  Troponins negative.  Chest x-ray showed bilateral lower lung opacities atelectasis versus pneumonia.  CT chest showed no PE but concerning for congestive heart failure tracheomalacia and multiple liver masses concerning for metastatic disease to the liver.  Patient was given IV Rocephin , Zithromax , DuoNebs, prednisone  and Lasix  20 and [facility] hospitalist consulted for admission  Assessment plan.  Acute hypoxemic respiratory failure in the setting of RSV bronchitis/pneumonia and CHF exacerbation: -Initially required CPAP.  Continue supplemental oxygen.  Chest x-ray and CTA without any PE but infiltrates.  Initially received Rocephin  and Zithromax  and Solu-Medrol  with Lasix  in the ED.  Afebrile.  Blood cultures pending.  MRSA PCR negative.  No leukocytosis today.  Procalcitonin less than 0.10.  Strict intake and output charting, daily weights.  Check 2D echocardiogram.  BNP elevated at 107  Hypokalemia.  Will replace.  Check levels in AM.   Liver lesion: -Concern for widespread metastatic disease.  Family was supposed to let us  know about further testing.   Type 2 diabetes melitis:  Will check A1c  -Diet controlled at home.   Dementia Mood disorder:  on Aricept , Seroquel , risperidone  and Namenda  at home.  Will resume.   Hypertension: Stable Continue amlodipine  and Coreg     GERD:  Continue PPI   CAD status post CABG: No acute issues.  Continue aspirin  beta-blocker   Chronic pain: Will continue  gabapentin    Thrombocytopenia: Will monitor, check CBC in AM.

## 2023-04-26 DIAGNOSIS — I5023 Acute on chronic systolic (congestive) heart failure: Secondary | ICD-10-CM

## 2023-04-26 DIAGNOSIS — J9601 Acute respiratory failure with hypoxia: Secondary | ICD-10-CM | POA: Diagnosis not present

## 2023-04-26 DIAGNOSIS — J205 Acute bronchitis due to respiratory syncytial virus: Secondary | ICD-10-CM | POA: Diagnosis not present

## 2023-04-26 LAB — HEMOGLOBIN A1C
Hgb A1c MFr Bld: 6.4 % — ABNORMAL HIGH (ref 4.8–5.6)
Mean Plasma Glucose: 137 mg/dL

## 2023-04-26 LAB — BASIC METABOLIC PANEL
Anion gap: 15 (ref 5–15)
BUN: 23 mg/dL (ref 8–23)
CO2: 24 mmol/L (ref 22–32)
Calcium: 9.5 mg/dL (ref 8.9–10.3)
Chloride: 97 mmol/L — ABNORMAL LOW (ref 98–111)
Creatinine, Ser: 0.99 mg/dL (ref 0.44–1.00)
GFR, Estimated: 54 mL/min — ABNORMAL LOW (ref >60)
Glucose, Bld: 156 mg/dL — ABNORMAL HIGH (ref 70–99)
Potassium: 4 mmol/L (ref 3.5–5.1)
Sodium: 136 mmol/L (ref 135–145)

## 2023-04-26 LAB — CBC
HCT: 49.2 % — ABNORMAL HIGH (ref 36.0–46.0)
Hemoglobin: 15.8 g/dL — ABNORMAL HIGH (ref 12.0–15.0)
MCH: 30.8 pg (ref 26.0–34.0)
MCHC: 32.1 g/dL (ref 30.0–36.0)
MCV: 95.9 fL (ref 80.0–100.0)
Platelets: 165 10*3/uL (ref 150–400)
RBC: 5.13 MIL/uL — ABNORMAL HIGH (ref 3.87–5.11)
RDW: 13.2 % (ref 11.5–15.5)
WBC: 5 10*3/uL (ref 4.0–10.5)
nRBC: 0 % (ref 0.0–0.2)

## 2023-04-26 LAB — MAGNESIUM: Magnesium: 2 mg/dL (ref 1.7–2.4)

## 2023-04-26 NOTE — Progress Notes (Signed)
 [facility]  PROGRESS NOTE  [name] FMW:[id number] DOB: [birth year] DOA: 04/24/2023 PCP: Geofm Glade PARAS, MD   Brief HPI:    89 y.o. female with medical history significant of dementia, hypertension, depression, CAD status post CABG, hyperlipidemia, type 2 diabetes, fibromyalgia presented to hospital with shortness of breath for 3 days with leg swelling.[name]  EMS noted that she had low pulse ox and was put on CPAP.  In the ED, patient was tachypneic and was on nasal cannula oxygen.  Initial labs showed no leukocytosis but with mild thrombocytopenia.  Respiratory viral panel was positive for RSV.  Troponins negative.  Chest x-ray showed bilateral lower lung opacities atelectasis versus pneumonia.  CT chest showed no PE but concerning for congestive heart failure tracheomalacia and multiple liver masses concerning for metastatic disease to the liver.  Patient was given IV Rocephin , Zithromax , DuoNebs, prednisone  and Lasix  20 and [facility] consulted for admission      Assessment/Plan:   Acute hypoxemic respiratory failure in the setting of RSV bronchitis/pneumonia and CHF exacerbation: -Initially required CPAP.  Continue supplemental oxygen.  Chest x-ray and CTA without any PE but infiltrates.  Initially received Rocephin  and Zithromax  and Solu-Medrol  with Lasix  in the ED. currently on IV Lasix  40 mg twice daily.  As per the daughter, scheduled to see cardiology as outpatient.  Afebrile.  Blood cultures pending.  MRSA PCR negative.  No leukocytosis today.  Procalcitonin less than 0.10.  Strict intake and output charting, daily weights.   2D echocardiogram with preserved EF at 65 percent.  BNP elevated at 107  -Will hold further doses of IV Lasix  -Check chest x-ray in a.m.    Hypokalemia.   -Replete   Liver lesion: -Concern for widespread metastatic disease.  Spoke with the patient'Susan daughter on the phone and she stated that her mother would not want to be worked up for this.    Type 2 diabetes mellitus:  Will check A1c  -Diet controlled at home.   Dementia/Mood disorder with mild agitation.:  on Aricept , Seroquel , risperidone  and Namenda  at home.  Will resume.  Patient is currently at assisted living facility memory care unit.  She does have baseline confusion as per the patient'Susan daughter.  As per the daughter, patient takes risperidone  for it.  Needed Haldol  x 1 yesterday.  Currently on mittens.   Hypertension: Continue amlodipine  and Coreg .  Has been elevated in the hospital.  Will add as needed hydralazine .   GERD:  Continue PPI   CAD status post CABG: No acute issues.  Continue aspirin  beta-blocker   Chronic pain: Will continue gabapentin        Medications     amLODipine   5 mg Oral BID   azithromycin   500 mg Oral Once   busPIRone   15 mg Oral BID   carvedilol   6.25 mg Oral BID WC   Chlorhexidine  Gluconate Cloth  6 each Topical Daily   donepezil   10 mg Oral QHS   enoxaparin  (LOVENOX ) injection  40 mg Subcutaneous Q24H   furosemide   40 mg Intravenous Q12H   memantine   10 mg Oral BID   methylPREDNISolone  (SOLU-MEDROL ) injection  40 mg Intravenous Q12H   pantoprazole   40 mg Oral Daily   QUEtiapine   50 mg Oral QHS   risperiDONE   0.5 mg Oral BID     Data Reviewed:   CBG:  No results for input(Susan): GLUCAP in the last 168 hours.  SpO2: 97 % O2 Flow Rate (L/min): 5 L/min  Vitals:   04/26/23 0437 04/26/23 0500 04/26/23 0600 04/26/23 0800  BP:  134/62 (!) 164/68   Pulse:      Resp:  20 15   Temp:    97.8 F (36.6 C)  TempSrc:    Axillary  SpO2:  95% 97%   Weight: 54.8 kg     Height:          Data Reviewed:  Basic Metabolic Panel: Recent Labs  Lab 04/24/23 0144 04/24/23 1028 04/25/23 0348 04/26/23 0326  NA 142  --  142 136  K 3.9  --  3.3* 4.0  CL 109  --  103 97*  CO2 25  --  28 24  GLUCOSE 145*  --  131* 156*  BUN 27*  --  19 23  CREATININE 0.80 0.70 0.60 0.99  CALCIUM  8.5*  --  9.1 9.5  MG  --  2.2  --  2.0   PHOS  --  4.2  --   --     CBC: Recent Labs  Lab 04/24/23 0144 04/24/23 1307 04/25/23 0348 04/26/23 0326  WBC 5.1 5.5 8.2 5.0  NEUTROABS 2.7  --   --   --   HGB 13.0 13.5 12.9 15.8*  HCT 42.2 43.3 40.9 49.2*  MCV 100.0 98.9 96.9 95.9  PLT 144* 164 166 165    LFT Recent Labs  Lab 04/24/23 0646 04/25/23 0348  AST 22 21  ALT 14 16  ALKPHOS 66 67  BILITOT 0.9 0.8  PROT 7.3 7.1  ALBUMIN 3.7 3.6     Antibiotics: Anti-infectives (From admission, onward)    Start     Dose/Rate Route Frequency Ordered Stop   04/26/23 1000  azithromycin  (ZITHROMAX ) tablet 500 mg        500 mg Oral  Once 04/25/23 1151     04/25/23 0300  azithromycin  (ZITHROMAX ) 500 mg in sodium chloride  0.9 % 250 mL IVPB  Status:  Discontinued        500 mg 250 mL/hr over 60 Minutes Intravenous Every 24 hours 04/24/23 0947 04/25/23 1151   04/25/23 0300  cefTRIAXone  (ROCEPHIN ) 1 g in sodium chloride  0.9 % 100 mL IVPB        1 g 200 mL/hr over 30 Minutes Intravenous Every 24 hours 04/24/23 0950     04/24/23 0245  cefTRIAXone  (ROCEPHIN ) 1 g in sodium chloride  0.9 % 100 mL IVPB        1 g 200 mL/hr over 30 Minutes Intravenous  Once 04/24/23 0244 04/24/23 0336   04/24/23 0245  azithromycin  (ZITHROMAX ) 500 mg in sodium chloride  0.9 % 250 mL IVPB        500 mg 250 mL/hr over 60 Minutes Intravenous  Once 04/24/23 0244 04/24/23 0501        DVT prophylaxis: Lovenox   Code Status: DNR  Family Communication: No family at bedside   CONSULTS    Subjective   Denies shortness of breath.   Objective    Physical Examination:   General-appears in no acute distress Heart-S1-S2, regular, no murmur auscultated Lungs-decreased breath sounds bilaterally Abdomen-soft, nontender, no organomegaly Extremities-no edema in the lower extremities Neuro-alert, oriented x3, no focal deficit noted   Status is: Inpatient:             [name] [name]   [facility] If 7PM-7AM, please contact  night-coverage at [email or web address], Office  [phone]   04/26/2023, 8:35 AM  LOS: 2 days

## 2023-04-26 NOTE — Evaluation (Signed)
 Occupational Therapy Evaluation Patient Details Name: [name] MRN: [id number] DOB: 06-02-1930 Today's Date: 04/26/2023   History of Present Illness 89 y.o. brought to ED with c/o SOB. admitted with acute  hypoxemic respiratory failure in the setting of RSV bronchitis/pneumonia and CHF exacerbation emale PMH: dementia, hypertension, depression, CAD status post CABG, hyperlipidemia, type 2 diabetes, fibromyalgia   Clinical Impression   Patient is a 89 year old female who was admitted for above. Patient was living at [facility] per chart review. Currently, patient is min A for bed mobility to get out of bed with patient noted to grab and R knee with standing attempts but patient reporting no pain. Nurse made aware. Patient needing increased time to follow commands with patient having known history of dementia. Pleasant and cooperative. Patient was noted to have decreased functional activity tolerance, decreased endurance, decreased standing balance, decreased safety awareness, and decreased knowledge of AD/AE impacting participation in ADLs. Patient would continue to benefit from skilled OT services at this time while admitted and after d/c to address noted deficits in order to improve overall safety and independence in ADLs.         If plan is discharge home, recommend the following: A lot of help with bathing/dressing/bathroom;A lot of help with walking and/or transfers;Assistance with cooking/housework;Direct supervision/assist for medications management;Assist for transportation;Help with stairs or ramp for entrance;Direct supervision/assist for financial management    Functional Status Assessment  Patient has had a recent decline in their functional status and/or demonstrates limited ability to make significant improvements in function in a reasonable and predictable amount of time  Equipment Recommendations  None recommended by OT       Precautions / Restrictions  Precautions Precautions: Fall Restrictions Weight Bearing Restrictions Per Provider Order: No      Mobility Bed Mobility Overal bed mobility: Needs Assistance Bed Mobility: Supine to Sit, Sit to Supine     Supine to sit: Min assist Sit to supine: Max assist                Balance Overall balance assessment: Needs assistance Sitting-balance support: Feet supported, No upper extremity supported Sitting balance-Leahy Scale: Fair     Standing balance support: Bilateral upper extremity supported, Reliant on assistive device for balance Standing balance-Leahy Scale: Poor         ADL either performed or assessed with clinical judgement   ADL Overall ADL's : Needs assistance/impaired   Eating/Feeding Details (indicate cue type and reason): declining to eat breakfast present in room Grooming: Sitting;Minimal assistance   Upper Body Bathing: Sitting;Moderate assistance   Lower Body Bathing: Bed level;Maximal assistance   Upper Body Dressing : Sitting;Moderate assistance   Lower Body Dressing: Bed level;Maximal assistance     Toilet Transfer Details (indicate cue type and reason): attempted standing with patient grabbing at right knee. patient reported its acting up and denies pain but appears to have pain response with attempted movement. returned to bed. Toileting- Clothing Manipulation and Hygiene: Bed level;Total assistance                Pertinent Vitals/Pain Pain Assessment Pain Assessment: Faces Faces Pain Scale: Hurts a little bit Pain Location: holding R knee/thigh with movement Pain Descriptors / Indicators: Grimacing, Guarding     Extremity/Trunk Assessment Upper Extremity Assessment Upper Extremity Assessment: Difficult to assess due to impaired cognition   Lower Extremity Assessment Lower Extremity Assessment: Defer to PT evaluation   Cervical / Trunk Assessment Cervical / Trunk Assessment: Normal  Communication  Communication Communication: No apparent difficulties   Cognition Arousal: Alert Behavior During Therapy: WFL for tasks assessed/performed Overall Cognitive Status: History of cognitive impairments - at baseline         General Comments: pt is pleasant and cooperative, follows one step commands, patient when asked name reported  her name is engineer, structural. patient was asked if that was her name. patient said yes. when asked if her last name was Noguchi patient repoted yes that is my name. patient is in bed with id bracelet that says name and nurse present in room at that time.                Home Living Family/patient expects to be discharged to:: Assisted living           Additional Comments: from [facility] per chart      Prior Functioning/Environment Prior Level of Function : Patient poor historian/Family not available             Mobility Comments: per previous notes pt amb with RW ADLs Comments: unable to answer questions        OT Problem List: Impaired balance (sitting and/or standing);Decreased safety awareness;Decreased knowledge of use of DME or AE;Decreased knowledge of precautions;Pain      OT Treatment/Interventions: Self-care/ADL training;DME and/or AE instruction;Patient/family education;Balance training    OT Goals(Current goals can be found in the care plan section) Acute Rehab OT Goals Patient Stated Goal: none stated OT Goal Formulation: Patient unable to participate in goal setting Time For Goal Achievement: 05/10/23 Potential to Achieve Goals: Fair  OT Frequency: Min 1X/week       AM-PAC OT 6 Clicks Daily Activity     Outcome Measure Help from another person eating meals?: A Little Help from another person taking care of personal grooming?: A Little Help from another person toileting, which includes using toliet, bedpan, or urinal?: A Lot Help from another person bathing (including washing, rinsing, drying)?: A Lot Help from  another person to put on and taking off regular upper body clothing?: A Little Help from another person to put on and taking off regular lower body clothing?: A Lot 6 Click Score: 15   End of Session Equipment Utilized During Treatment: Gait belt;Rolling walker (2 wheels) Nurse Communication: Other (comment) (patient grabbing at R knee during session)  Activity Tolerance: Patient tolerated treatment well Patient left: in bed;with call bell/phone within reach;with bed alarm set;with restraints reapplied  OT Visit Diagnosis: Unsteadiness on feet (R26.81);Other abnormalities of gait and mobility (R26.89);Pain Pain - Right/Left: Right Pain - part of body: Knee                Time: 1025-1045 OT Time Calculation (min): 20 min Charges:  OT General Charges $OT Visit: 1 Visit OT Evaluation $OT Eval Low Complexity: 1 Low  Merline Perkin OTR/L, MS Acute Rehabilitation Department Office# [phone]   Geofm CHRISTELLA Dance 04/26/2023, 12:01 PM

## 2023-04-27 ENCOUNTER — Other Ambulatory Visit: Payer: Self-pay | Admitting: Neurology

## 2023-04-27 ENCOUNTER — Other Ambulatory Visit: Payer: Self-pay | Admitting: Internal Medicine

## 2023-04-27 ENCOUNTER — Inpatient Hospital Stay (HOSPITAL_COMMUNITY): Payer: Medicare Other

## 2023-04-27 DIAGNOSIS — I25810 Atherosclerosis of coronary artery bypass graft(s) without angina pectoris: Secondary | ICD-10-CM

## 2023-04-27 DIAGNOSIS — I5023 Acute on chronic systolic (congestive) heart failure: Secondary | ICD-10-CM | POA: Diagnosis not present

## 2023-04-27 DIAGNOSIS — F01B3 Vascular dementia, moderate, with mood disturbance: Secondary | ICD-10-CM

## 2023-04-27 DIAGNOSIS — J205 Acute bronchitis due to respiratory syncytial virus: Secondary | ICD-10-CM | POA: Diagnosis not present

## 2023-04-27 DIAGNOSIS — J9601 Acute respiratory failure with hypoxia: Secondary | ICD-10-CM | POA: Diagnosis not present

## 2023-04-27 LAB — CBC
HCT: 50.7 % — ABNORMAL HIGH (ref 36.0–46.0)
Hemoglobin: 16.5 g/dL — ABNORMAL HIGH (ref 12.0–15.0)
MCH: 30.6 pg (ref 26.0–34.0)
MCHC: 32.5 g/dL (ref 30.0–36.0)
MCV: 93.9 fL (ref 80.0–100.0)
Platelets: 142 10*3/uL — ABNORMAL LOW (ref 150–400)
RBC: 5.4 MIL/uL — ABNORMAL HIGH (ref 3.87–5.11)
RDW: 13.4 % (ref 11.5–15.5)
WBC: 6 10*3/uL (ref 4.0–10.5)
nRBC: 0 % (ref 0.0–0.2)

## 2023-04-27 LAB — COMPREHENSIVE METABOLIC PANEL
ALT: 16 U/L (ref 0–44)
AST: 16 U/L (ref 15–41)
Albumin: 3.5 g/dL (ref 3.5–5.0)
Alkaline Phosphatase: 62 U/L (ref 38–126)
Anion gap: 14 (ref 5–15)
BUN: 44 mg/dL — ABNORMAL HIGH (ref 8–23)
CO2: 23 mmol/L (ref 22–32)
Calcium: 8.8 mg/dL — ABNORMAL LOW (ref 8.9–10.3)
Chloride: 96 mmol/L — ABNORMAL LOW (ref 98–111)
Creatinine, Ser: 1.2 mg/dL — ABNORMAL HIGH (ref 0.44–1.00)
GFR, Estimated: 43 mL/min — ABNORMAL LOW (ref >60)
Glucose, Bld: 158 mg/dL — ABNORMAL HIGH (ref 70–99)
Potassium: 3.9 mmol/L (ref 3.5–5.1)
Sodium: 133 mmol/L — ABNORMAL LOW (ref 135–145)
Total Bilirubin: 0.9 mg/dL (ref 0.0–1.2)
Total Protein: 7.5 g/dL (ref 6.5–8.1)

## 2023-04-27 MED ORDER — GUAIFENESIN 100 MG/5ML PO LIQD
15.0000 mL | ORAL | Status: DC
  Administered 2023-04-27 – 2023-04-29 (×11): 15 mL via ORAL
  Filled 2023-04-27 (×11): qty 20

## 2023-04-27 MED ORDER — ENOXAPARIN SODIUM 30 MG/0.3ML IJ SOSY
30.0000 mg | PREFILLED_SYRINGE | INTRAMUSCULAR | Status: DC
  Administered 2023-04-27 – 2023-04-28 (×2): 30 mg via SUBCUTANEOUS
  Filled 2023-04-27 (×3): qty 0.3

## 2023-04-27 NOTE — Progress Notes (Signed)
 [facility]  PROGRESS NOTE  [name] FMW:[id number] DOB: 1931/01/17 DOA: 04/24/2023 PCP: Geofm Glade PARAS, MD   Brief HPI:    89 y.o. female with medical history significant of dementia, hypertension, depression, CAD status post CABG, hyperlipidemia, type 2 diabetes, fibromyalgia presented to hospital with shortness of breath for 3 days with leg swelling.[name]  EMS noted that she had low pulse ox and was put on CPAP.  In the ED, patient was tachypneic and was on nasal cannula oxygen.  Initial labs showed no leukocytosis but with mild thrombocytopenia.  Respiratory viral panel was positive for RSV.  Troponins negative.  Chest x-ray showed bilateral lower lung opacities atelectasis versus pneumonia.  CT chest showed no PE but concerning for congestive heart failure tracheomalacia and multiple liver masses concerning for metastatic disease to the liver.  Patient was given IV Rocephin , Zithromax , DuoNebs, prednisone  and Lasix  20 and [facility] consulted for admission      Assessment/Plan:   Acute hypoxemic respiratory failure in the setting of RSV bronchitis/pneumonia and CHF exacerbation: -Initially required CPAP.  Continue supplemental oxygen.  Still requiring 6 L/min HFNC -Chest x-ray and CTA were negative for PE but showed infiltrates -Started on Rocephin  and Zithromax  with Solu-Medrol  -Also started on Lasix  40 mg IV twice daily; 2D echo showed EF of 65%, BNP 107 -Further doses of Lasix  were held as patient was euvolemic -Will discontinue Solu-Medrol   Acute kidney injury -BUN/creatinine elevated 44/1.20 -Likely in setting of diuresis with IV Lasix  -IV Lasix  is currently discontinued -Follow BMP in am  Hypokalemia.   -Replete   Liver lesion: -Concern for widespread metastatic disease.  Dr. Sonjia spoke with the patient's daughter on the phone and she stated that her mother would not want to be worked up for this.   Type 2 diabetes mellitus:  Will check A1c  -Diet  controlled at home.   Dementia/Mood disorder with mild agitation.:  on Aricept , Seroquel , risperidone  and Namenda  at home.  Home meds have been resumed.  Patient is currently at assisted living facility memory care unit.  She does have baseline confusion as per the patient's daughter.  As per the daughter, patient takes risperidone  for it.   Hypertension: Continue amlodipine  and Coreg .  Has been elevated in the hospital.  Continue as needed hydralazine .   GERD:  Continue PPI   CAD status post CABG: No acute issues.  Continue aspirin  beta-blocker   Chronic pain: Will continue gabapentin        Medications     amLODipine   5 mg Oral BID   busPIRone   15 mg Oral BID   carvedilol   6.25 mg Oral BID WC   Chlorhexidine  Gluconate Cloth  6 each Topical Daily   donepezil   10 mg Oral QHS   enoxaparin  (LOVENOX ) injection  30 mg Subcutaneous Q24H   memantine   10 mg Oral BID   methylPREDNISolone  (SOLU-MEDROL ) injection  40 mg Intravenous Q12H   pantoprazole   40 mg Oral Daily   QUEtiapine   50 mg Oral QHS   risperiDONE   0.5 mg Oral BID     Data Reviewed:   CBG:  No results for input(s): GLUCAP in the last 168 hours.  SpO2: 95 % O2 Flow Rate (L/min): 6 L/min    Vitals:   04/27/23 0900 04/27/23 0915 04/27/23 1000 04/27/23 1100  BP:  131/64  (!) 130/51  Pulse:    66  Resp: (!) 22 (!) 23 (!) 21 19  Temp:      TempSrc:  SpO2: (!) 89%   95%  Weight:      Height:          Data Reviewed:  Basic Metabolic Panel: Recent Labs  Lab 04/24/23 0144 04/24/23 1028 04/25/23 0348 04/26/23 0326 04/27/23 0556  NA 142  --  142 136 133*  K 3.9  --  3.3* 4.0 3.9  CL 109  --  103 97* 96*  CO2 25  --  28 24 23   GLUCOSE 145*  --  131* 156* 158*  BUN 27*  --  19 23 44*  CREATININE 0.80 0.70 0.60 0.99 1.20*  CALCIUM  8.5*  --  9.1 9.5 8.8*  MG  --  2.2  --  2.0  --   PHOS  --  4.2  --   --   --     CBC: Recent Labs  Lab 04/24/23 0144 04/24/23 1307 04/25/23 0348  04/26/23 0326 04/27/23 0300  WBC 5.1 5.5 8.2 5.0 6.0  NEUTROABS 2.7  --   --   --   --   HGB 13.0 13.5 12.9 15.8* 16.5*  HCT 42.2 43.3 40.9 49.2* 50.7*  MCV 100.0 98.9 96.9 95.9 93.9  PLT 144* 164 166 165 142*    LFT Recent Labs  Lab 04/24/23 0646 04/25/23 0348 04/27/23 0556  AST 22 21 16   ALT 14 16 16   ALKPHOS 66 67 62  BILITOT 0.9 0.8 0.9  PROT 7.3 7.1 7.5  ALBUMIN 3.7 3.6 3.5     Antibiotics: Anti-infectives (From admission, onward)    Start     Dose/Rate Route Frequency Ordered Stop   04/26/23 1000  azithromycin  (ZITHROMAX ) tablet 500 mg        500 mg Oral  Once 04/25/23 1151 04/26/23 1016   04/25/23 0300  azithromycin  (ZITHROMAX ) 500 mg in sodium chloride  0.9 % 250 mL IVPB  Status:  Discontinued        500 mg 250 mL/hr over 60 Minutes Intravenous Every 24 hours 04/24/23 0947 04/25/23 1151   04/25/23 0300  cefTRIAXone  (ROCEPHIN ) 1 g in sodium chloride  0.9 % 100 mL IVPB        1 g 200 mL/hr over 30 Minutes Intravenous Every 24 hours 04/24/23 0950     04/24/23 0245  cefTRIAXone  (ROCEPHIN ) 1 g in sodium chloride  0.9 % 100 mL IVPB        1 g 200 mL/hr over 30 Minutes Intravenous  Once 04/24/23 0244 04/24/23 0336   04/24/23 0245  azithromycin  (ZITHROMAX ) 500 mg in sodium chloride  0.9 % 250 mL IVPB        500 mg 250 mL/hr over 60 Minutes Intravenous  Once 04/24/23 0244 04/24/23 0501        DVT prophylaxis: Lovenox   Code Status: DNR  Family Communication: No family at bedside   CONSULTS    Subjective   Patient seen and examined, denies any complaints.  Denies shortness of breath.   Objective    Physical Examination:  Appears pleasantly confused S1-S2, regular Lungs clear to auscultation bilaterally Abdomen is soft, nontender, no organomegaly    Status is: Inpatient:      Pressure Injury 04/24/23 Sacrum Medial Stage 2 -  Partial thickness loss of dermis presenting as a shallow open injury with a red, pink wound bed without slough. healing  prior pressure injury (Active)  04/24/23 1630  Location: Sacrum  Location Orientation: Medial  Staging: Stage 2 -  Partial thickness loss of dermis presenting as a shallow open injury with  a red, pink wound bed without slough.  Wound Description (Comments): healing prior pressure injury  Present on Admission: Yes        [name] S Tahiri Shareef   [facility] If 7PM-7AM, please contact night-coverage at [email or web address], Office  [phone]   04/27/2023, 11:12 AM  LOS: 3 days

## 2023-04-27 NOTE — Plan of Care (Signed)
  Problem: Clinical Measurements: Goal: Respiratory complications will improve Outcome: Progressing   Problem: Nutrition: Goal: Adequate nutrition will be maintained Outcome: Progressing   

## 2023-04-27 NOTE — Progress Notes (Signed)
 Speech Language Pathology Treatment:    Patient Details Name: [name] MRN: [id number] DOB: 10-31-1930 Today's Date: 04/27/2023 Time: 8184-8169 SLP Time Calculation (min) (ACUTE ONLY): 15 min  Assessment / Plan / Recommendation Clinical Impression  Pt seen to address dysphagia goals.  Upon entrance to room, pt with nearly full dinner tray on her table.  She had eaten a few bites as her fork was in her fish upon opening lid.   Observed pt consuming fish, cooked carrots, bites of roll, canned fruit and tea.  Pt tends to eat rapidly - with minimal delay in clearing mouth but no clinical indication of aspiration nor oral pocketing at completion of snack. Voice remained clear throughout. Pt states she would like some cornbread.    No increased work of breathing observed with all po intake  At this time, will advance to regular/thin - Recommend intermittently check on pt during po given her rapid rate of eating but otherwise no SLP follow up needed.  Spoke to RN and pt regarding recommendations.    CXR today was negative for acute findings and pt denies dysphagia. Advised pt to recommendations but her dementia does not allow her to comprehend.  She is polite and cooperative.    HPI HPI: 89 y.o. female with medical history significant of dementia, hypertension, depression, CAD status post CABG, hyperlipidemia, type 2 diabetes, fibromyalgia presented with shortness of breath since 3 days.  EMS was called and found to have low oxygen saturation and was placed on CPAP on arrival. Respiratory panel positive for RSV. Chest x-ray concerning for bilateral lower lung airspace opacities atelectasis versus pneumonia.  Repeat CXR reveals nothing new.      SLP Plan  All goals met      Recommendations for follow up therapy are one component of a multi-disciplinary discharge planning process, led by the attending physician.  Recommendations may be updated based on patient status, additional functional  criteria and insurance authorization.    Recommendations  Diet recommendations: Regular;Thin liquid Liquids provided via: Cup;Straw Medication Administration: Whole meds with liquid Supervision: Patient able to self feed;Intermittent supervision to cue for compensatory strategies Compensations: Slow rate;Small sips/bites Postural Changes and/or Swallow Maneuvers: Seated upright 90 degrees;Upright 30-60 min after meal                  Oral care BID     Dysphagia, oral phase (R13.11)     All goals met   Madelin POUR, MS [facility] SLP Acute Rehab Services Office [phone]   Nicolas Emmie Caldron  04/27/2023, 6:50 PM

## 2023-04-27 NOTE — Progress Notes (Signed)
 Physical Therapy Treatment Patient Details Name: [name] MRN: [id number] DOB: 1931/04/02 Today's Date: 04/27/2023   History of Present Illness 89 y.o. brought to ED with c/o SOB. admitted with acute  hypoxemic respiratory failure in the setting of RSV bronchitis/pneumonia and CHF exacerbation emale PMH: dementia, hypertension, depression, CAD status post CABG, hyperlipidemia, type 2 diabetes, fibromyalgia    PT Comments  The patient is pleasant and able to participate in mobility,  took a few  steps with RW. Should be able to progress when all equipment arranged to progress ambulation . Patient on /5 L HFNC, SPO2  96%.  Patient hopefully will  progress to return to ALF with HHPT.    If plan is discharge home, recommend the following: A little help with walking and/or transfers;A little help with bathing/dressing/bathroom;Help with stairs or ramp for entrance;Supervision due to cognitive status;Assist for transportation   Can travel by private vehicle        Equipment Recommendations       Recommendations for Other Services       Precautions / Restrictions Precautions Precautions: Fall Precaution Comments: monitor sats     Mobility  Bed Mobility               General bed mobility comments: in recliner    Transfers Overall transfer level: Needs assistance Equipment used: Rolling walker (2 wheels) Transfers: Sit to/from Stand Sit to Stand: Min assist           General transfer comment: assist to rise and transition to stand,    Ambulation/Gait Ambulation/Gait assistance: Min assist Gait Distance (Feet): 4 Feet Assistive device: Rolling walker (2 wheels) Gait Pattern/deviations: Step-through pattern       General Gait Details: took 5 steps forward and back and stepped in place x 10.   Stairs             Wheelchair Mobility     Tilt Bed    Modified Rankin (Stroke Patients Only)       Balance Overall balance assessment: Needs  assistance Sitting-balance support: Feet supported, No upper extremity supported Sitting balance-Leahy Scale: Fair     Standing balance support: Bilateral upper extremity supported, Reliant on assistive device for balance Standing balance-Leahy Scale: Fair                              Cognition Arousal: Alert Behavior During Therapy: WFL for tasks assessed/performed Overall Cognitive Status: History of cognitive impairments - at baseline                                 General Comments: pt is pleasant and cooperative, follows one step commands. 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, broadcasting/film/video in [place],        Exercises      General Comments        Pertinent Vitals/Pain Pain Assessment Pain Assessment: No/denies pain    Home Living                          Prior Function            PT Goals (current goals can now be found in the care plan section) Progress towards PT goals: Progressing toward goals    Frequency    Min 1X/week      PT Plan  Co-evaluation              AM-PAC PT 6 Clicks Mobility   Outcome Measure  Help needed turning from your back to your side while in a flat bed without using bedrails?: A Little Help needed moving from lying on your back to sitting on the side of a flat bed without using bedrails?: A Little Help needed moving to and from a bed to a chair (including a wheelchair)?: A Little Help needed standing up from a chair using your arms (e.g., wheelchair or bedside chair)?: A Little Help needed to walk in hospital room?: A Lot Help needed climbing 3-5 steps with a railing? : Total 6 Click Score: 15    End of Session Equipment Utilized During Treatment: Oxygen Activity Tolerance: Patient tolerated treatment well Patient left: in chair;with call bell/phone within reach;with chair alarm set;with family/visitor present Nurse Communication: Mobility status PT Visit Diagnosis: Other  abnormalities of gait and mobility (R26.89)     Time: 8447-8385 PT Time Calculation (min) (ACUTE ONLY): 22 min  Charges:    $Gait Training: 8-22 mins PT General Charges $$ ACUTE PT VISIT: 1 Visit                     Darice Potters PT Acute Rehabilitation Services Office [phone] Weekend pager-[phone]    Potters Darice Norris 04/27/2023, 4:28 PM

## 2023-04-27 NOTE — Plan of Care (Signed)
  Problem: Clinical Measurements: Goal: Respiratory complications will improve Outcome: Progressing Goal: Cardiovascular complication will be avoided Outcome: Progressing   Problem: Activity: Goal: Risk for activity intolerance will decrease Outcome: Progressing   Problem: Nutrition: Goal: Adequate nutrition will be maintained Outcome: Progressing   Problem: Coping: Goal: Level of anxiety will decrease Outcome: Progressing   Problem: Pain Management: Goal: General experience of comfort will improve Outcome: Progressing   Problem: Safety: Goal: Ability to remain free from injury will improve Outcome: Progressing   Problem: Safety: Goal: Non-violent Restraint(s) Outcome: Progressing

## 2023-04-28 DIAGNOSIS — J205 Acute bronchitis due to respiratory syncytial virus: Secondary | ICD-10-CM | POA: Diagnosis not present

## 2023-04-28 DIAGNOSIS — J9601 Acute respiratory failure with hypoxia: Secondary | ICD-10-CM | POA: Diagnosis not present

## 2023-04-28 DIAGNOSIS — I5023 Acute on chronic systolic (congestive) heart failure: Secondary | ICD-10-CM | POA: Diagnosis not present

## 2023-04-28 LAB — COMPREHENSIVE METABOLIC PANEL
ALT: 19 U/L (ref 0–44)
AST: 17 U/L (ref 15–41)
Albumin: 3.4 g/dL — ABNORMAL LOW (ref 3.5–5.0)
Alkaline Phosphatase: 58 U/L (ref 38–126)
Anion gap: 11 (ref 5–15)
BUN: 54 mg/dL — ABNORMAL HIGH (ref 8–23)
CO2: 26 mmol/L (ref 22–32)
Calcium: 8.6 mg/dL — ABNORMAL LOW (ref 8.9–10.3)
Chloride: 97 mmol/L — ABNORMAL LOW (ref 98–111)
Creatinine, Ser: 0.91 mg/dL (ref 0.44–1.00)
GFR, Estimated: 60 mL/min — ABNORMAL LOW (ref >60)
Glucose, Bld: 142 mg/dL — ABNORMAL HIGH (ref 70–99)
Potassium: 3.3 mmol/L — ABNORMAL LOW (ref 3.5–5.1)
Sodium: 134 mmol/L — ABNORMAL LOW (ref 135–145)
Total Bilirubin: 0.8 mg/dL (ref 0.0–1.2)
Total Protein: 7 g/dL (ref 6.5–8.1)

## 2023-04-28 MED ORDER — POTASSIUM CHLORIDE 20 MEQ PO PACK
40.0000 meq | PACK | Freq: Once | ORAL | Status: AC
Start: 2023-04-28 — End: 2023-04-28
  Administered 2023-04-28: 40 meq via ORAL
  Filled 2023-04-28: qty 2

## 2023-04-28 MED ORDER — BENZONATATE 100 MG PO CAPS
100.0000 mg | ORAL_CAPSULE | Freq: Three times a day (TID) | ORAL | Status: DC | PRN
  Administered 2023-04-28: 100 mg via ORAL
  Filled 2023-04-28: qty 1

## 2023-04-28 NOTE — Progress Notes (Signed)
 [facility]  PROGRESS NOTE  [name] FMW:[id number] DOB: [birth year] DOA: 04/24/2023 PCP: Geofm Glade PARAS, MD   Brief HPI:    89 y.o. female with medical history significant of dementia, hypertension, depression, CAD status post CABG, hyperlipidemia, type 2 diabetes, fibromyalgia presented to hospital with shortness of breath for 3 days with leg swelling.[name]  EMS noted that she had low pulse ox and was put on CPAP.  In the ED, patient was tachypneic and was on nasal cannula oxygen.  Initial labs showed no leukocytosis but with mild thrombocytopenia.  Respiratory viral panel was positive for RSV.  Troponins negative.  Chest x-ray showed bilateral lower lung opacities atelectasis versus pneumonia.  CT chest showed no PE but concerning for congestive heart failure tracheomalacia and multiple liver masses concerning for metastatic disease to the liver.  Patient was given IV Rocephin , Zithromax , DuoNebs, prednisone  and Lasix  20 and [facility] consulted for admission      Assessment/Plan:   Acute hypoxemic respiratory failure in the setting of RSV bronchitis/pneumonia and CHF exacerbation: -Initially required CPAP.  Continue supplemental oxygen.  Oxygen has been weaned off to room air -Chest x-ray and CTA were negative for PE but showed infiltrates -Started on Rocephin  and Zithromax  with Solu-Medrol  -Completed antibiotics, steroids have been discontinued. -Also started on Lasix  40 mg IV twice daily; 2D echo showed EF of 65%, BNP 107 -Further doses of Lasix  were held as patient was euvolemic   Acute kidney injury -BUN/creatinine was elevated 44/1.20 -Likely in setting of diuresis with IV Lasix  -Improved after stopping Lasix .  Today creatinine 0.91   Hypokalemia.   -Potassium is 3.3 -Replace potassium and follow BMP in am   Liver lesion: -Concern for widespread metastatic disease.  Dr. Sonjia spoke with the patient's daughter on the phone and she stated that her mother  would not want to be worked up for this.   Type 2 diabetes mellitus:  Will check A1c  -Diet controlled at home.   Dementia/Mood disorder with mild agitation.:  on Aricept , Seroquel , risperidone  and Namenda  at home.  Home meds have been resumed.  Patient is currently at assisted living facility memory care unit.  She does have baseline confusion as per the patient's daughter.  As per the daughter, patient takes risperidone  for it.   Hypertension: Continue amlodipine  and Coreg .  Has been elevated in the hospital.  Continue as needed hydralazine .   GERD:  Continue PPI   CAD status post CABG: No acute issues.  Continue aspirin  beta-blocker   Chronic pain: Will continue gabapentin    If continues to do well and not require oxygen, can be discharged back to facility in next 24 hours.    Medications     amLODipine   5 mg Oral BID   busPIRone   15 mg Oral BID   carvedilol   6.25 mg Oral BID WC   Chlorhexidine  Gluconate Cloth  6 each Topical Daily   donepezil   10 mg Oral QHS   enoxaparin  (LOVENOX ) injection  30 mg Subcutaneous Q24H   guaiFENesin   15 mL Oral Q4H   memantine   10 mg Oral BID   pantoprazole   40 mg Oral Daily   QUEtiapine   50 mg Oral QHS   risperiDONE   0.5 mg Oral BID     Data Reviewed:   CBG:  No results for input(s): GLUCAP in the last 168 hours.  SpO2: 95 % O2 Flow Rate (L/min): 2 L/min    Vitals:   04/28/23 0412 04/28/23 0557 04/28/23 0600  04/28/23 0730  BP: 134/64  (!) 145/60   Pulse: 64  64 65  Resp: 14  16 19   Temp:    98 F (36.7 C)  TempSrc:    Oral  SpO2: 93%  96% 95%  Weight:  54.5 kg    Height:          Data Reviewed:  Basic Metabolic Panel: Recent Labs  Lab 04/24/23 0144 04/24/23 1028 04/25/23 0348 04/26/23 0326 04/27/23 0556  NA 142  --  142 136 133*  K 3.9  --  3.3* 4.0 3.9  CL 109  --  103 97* 96*  CO2 25  --  28 24 23   GLUCOSE 145*  --  131* 156* 158*  BUN 27*  --  19 23 44*  CREATININE 0.80 0.70 0.60 0.99 1.20*   CALCIUM  8.5*  --  9.1 9.5 8.8*  MG  --  2.2  --  2.0  --   PHOS  --  4.2  --   --   --     CBC: Recent Labs  Lab 04/24/23 0144 04/24/23 1307 04/25/23 0348 04/26/23 0326 04/27/23 0300  WBC 5.1 5.5 8.2 5.0 6.0  NEUTROABS 2.7  --   --   --   --   HGB 13.0 13.5 12.9 15.8* 16.5*  HCT 42.2 43.3 40.9 49.2* 50.7*  MCV 100.0 98.9 96.9 95.9 93.9  PLT 144* 164 166 165 142*    LFT Recent Labs  Lab 04/24/23 0646 04/25/23 0348 04/27/23 0556  AST 22 21 16   ALT 14 16 16   ALKPHOS 66 67 62  BILITOT 0.9 0.8 0.9  PROT 7.3 7.1 7.5  ALBUMIN 3.7 3.6 3.5     Antibiotics: Anti-infectives (From admission, onward)    Start     Dose/Rate Route Frequency Ordered Stop   04/26/23 1000  azithromycin  (ZITHROMAX ) tablet 500 mg        500 mg Oral  Once 04/25/23 1151 04/26/23 1016   04/25/23 0300  azithromycin  (ZITHROMAX ) 500 mg in sodium chloride  0.9 % 250 mL IVPB  Status:  Discontinued        500 mg 250 mL/hr over 60 Minutes Intravenous Every 24 hours 04/24/23 0947 04/25/23 1151   04/25/23 0300  cefTRIAXone  (ROCEPHIN ) 1 g in sodium chloride  0.9 % 100 mL IVPB        1 g 200 mL/hr over 30 Minutes Intravenous Every 24 hours 04/24/23 0950 04/28/23 0508   04/24/23 0245  cefTRIAXone  (ROCEPHIN ) 1 g in sodium chloride  0.9 % 100 mL IVPB        1 g 200 mL/hr over 30 Minutes Intravenous  Once 04/24/23 0244 04/24/23 0336   04/24/23 0245  azithromycin  (ZITHROMAX ) 500 mg in sodium chloride  0.9 % 250 mL IVPB        500 mg 250 mL/hr over 60 Minutes Intravenous  Once 04/24/23 0244 04/24/23 0501        DVT prophylaxis: Lovenox   Code Status: DNR  Family Communication: No family at bedside   CONSULTS    Subjective   Continues to be pleasantly confused.  Denies any complaints   Objective    Physical Examination:  Appears in no acute distress S1-S2, regular Lungs clear to auscultation bilaterally Abdomen is soft, nontender, no organomegaly   Status is: Inpatient:      Pressure  Injury 04/24/23 Sacrum Medial Stage 2 -  Partial thickness loss of dermis presenting as a shallow open injury with a red, pink wound bed  without slough. healing prior pressure injury (Active)  04/24/23 1630  Location: Sacrum  Location Orientation: Medial  Staging: Stage 2 -  Partial thickness loss of dermis presenting as a shallow open injury with a red, pink wound bed without slough.  Wound Description (Comments): healing prior pressure injury  Present on Admission: Yes        Minah Axelrod S Dorsey Authement   [facility] If 7PM-7AM, please contact night-coverage at [email or web address], Office  [phone]   04/28/2023, 7:53 AM  LOS: 4 days

## 2023-04-28 NOTE — TOC Initial Note (Signed)
 Transition of Care [facility]) - Initial/Assessment Note    Patient Details  Name: [name] MRN: [id number] Date of Birth: May 01, 1930  [facility]) CM/SW Contact:    Dalila Camellia SAUNDERS, LCSW Phone Number: 04/28/2023, 6:07 PM  Clinical Narrative:                  Patient is a 89 year old female who is alert and oriented x1.  Patient is a resident at [facility].  CSW spoke to patient's daughter Dr. Cindia Pinal, [phone], to complete assessment due to patient's dementia.  Per patient's daughter, patient is able to walk around facility, she participates in activities.  Staff assist her with ADLs, patient is able to use the bathroom, but sometimes needs assistance due to incontinence.  Patient has been living at facility since 2023.  Patient plans to return back to [facility].  Per patient's daughter, she will transport her back once she is medically ready for discharge.  PT are recommending HH PT, per daughter, they would like to use [facility], which she has used in the past.  Patient's daughter did not have any other questions, plan to discharge back to memory care facility once medically ready for discharge.  A new FL2 will have to be completed day of discharge with discharge medications.  Expected Discharge Plan: Memory Care Barriers to Discharge: Continued Medical Work up   Patient Goals and CMS Choice Patient states their goals for this hospitalization and ongoing recovery are:: To return back to [facility]. CMS Medicare.gov Compare Post Acute Care list provided to:: Patient Represenative (must comment) (Patient's daughter) Choice offered to / list presented to : Adult Children [place] ownership interest in [facility].provided to:: Adult Children    Expected Discharge Plan and Services     Post Acute Care Choice: Home Health Living arrangements for the past 2 months: Assisted Living Facility                                       Prior Living Arrangements/Services Living arrangements for the past 2 months: Assisted Living Facility Lives with:: Facility Resident Patient language and need for interpreter reviewed:: Yes Do you feel safe going back to the place where you live?: Yes      Need for Family Participation in Patient Care: Yes (Comment) Care giver support system in place?: Yes (comment)   Criminal Activity/Legal Involvement Pertinent to Current Situation/Hospitalization: No - Comment as needed  Activities of Daily Living   ADL Screening (condition at time of admission) Independently performs ADLs?: No Does the patient have a NEW difficulty with bathing/dressing/toileting/self-feeding that is expected to last >3 days?: Yes (Initiates electronic notice to provider for possible OT consult) Does the patient have a NEW difficulty with getting in/out of bed, walking, or climbing stairs that is expected to last >3 days?: No Does the patient have a NEW difficulty with communication that is expected to last >3 days?: No Is the patient deaf or have difficulty hearing?: No Does the patient have difficulty seeing, even when wearing glasses/contacts?: No Does the patient have difficulty concentrating, remembering, or making decisions?: Yes  Permission Sought/Granted Permission sought to share information with : Case Manager, Magazine Features Editor, Family Supports Permission granted to share information with : Yes, Verbal Permission Granted, Yes, Release of Information Signed  Share Information with  NAME: Cleotilde Imagene Conch Daughter   [phone]  Permission granted to share info w AGENCY: Memory Care ALF and home health agencies        Emotional Assessment Appearance:: Appears stated age Attitude/Demeanor/Rapport: Engaged Affect (typically observed): Calm, Stable, Appropriate Orientation: : Oriented to Self Alcohol / Substance Use: Not Applicable Psych Involvement: No  (comment)  Admission diagnosis:  Acute on chronic systolic congestive heart failure (HCC) [I50.23] Acute respiratory failure with hypoxia (HCC) [J96.01] RSV bronchitis [J20.5] Acute hypoxemic respiratory failure (HCC) [J96.01] Patient Active Problem List   Diagnosis Date Noted   Acute hypoxemic respiratory failure (HCC) 04/24/2023   Peripheral arterial disease (HCC) 08/27/2022   Pressure ulcer of heel, right, unstageable (HCC) 06/12/2022   Osteoarthritis 02/02/2022   Type 2 diabetes mellitus (HCC) 01/31/2022   Drug reaction 01/30/2022   Thrombocytopenia (HCC) 01/08/2022   Palliative care encounter 09/21/2021   Urinary frequency 09/18/2020   Aortic atherosclerosis (HCC) 08/07/2020   Dementia with behavioral disturbance (HCC) 07/27/2017   Chronic RUQ pain 05/28/2016   Right knee pain 06/14/2015   Diabetic neuropathy (HCC) 03/21/2014   Bunion 03/21/2014   PVD (pulmonary valve disease) 01/23/2014   Onychomycosis 12/20/2013   PVD (peripheral vascular disease) (HCC) 12/19/2013   Gout 12/13/2013   Depression    Lumbar canal stenosis 05/26/2013   Neuralgia neuritis, sciatic nerve 05/26/2013   Degenerative arthritis of lumbar spine with cord compression 04/24/2013   Arthritis of shoulder region, left 09/11/2011   Type II diabetes mellitus with neurological manifestations (HCC) 01/17/2009   Anemia 10/15/2008   CAD, NATIVE VESSEL 06/09/2008   Hyperlipidemia 06/15/2007   Essential hypertension 06/15/2007   GERD 06/15/2007   TUBULOVILLOUS ADENOMA, 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   Diverticulosis of colon 03/02/2007   PCP:  Geofm Glade PARAS, MD Pharmacy:   [facility] [id number] [place], [place] - [address] [place] [address] Phone: [phone] Fax: [phone]     Social Drivers of Health (SDOH) Social History: SDOH Screenings   Food Insecurity: No Food Insecurity (04/24/2023)  Housing: Low Risk  (04/24/2023)  Transportation Needs: No Transportation  Needs (04/24/2023)  Utilities: Not At Risk (04/24/2023)  Depression (PHQ2-9): Low Risk  (08/08/2020)  Social Connections: Patient Unable To Answer (04/24/2023)  Tobacco Use: Medium Risk (04/24/2023)   SDOH Interventions:     Readmission Risk Interventions     No data to display

## 2023-04-28 NOTE — Plan of Care (Signed)
  Problem: Education: Goal: Knowledge of General Education information will improve Description: Including pain rating scale, medication(s)/side effects and non-pharmacologic comfort measures Outcome: Progressing   Problem: Health Behavior/Discharge Planning: Goal: Ability to manage health-related needs will improve Outcome: Progressing   Problem: Clinical Measurements: Goal: Ability to maintain clinical measurements within normal limits will improve Outcome: Progressing Goal: Will remain free from infection Outcome: Progressing Goal: Diagnostic test results will improve Outcome: Progressing Goal: Respiratory complications will improve Outcome: Progressing Goal: Cardiovascular complication will be avoided Outcome: Progressing   Problem: Activity: Goal: Risk for activity intolerance will decrease Outcome: Progressing   Problem: Nutrition: Goal: Adequate nutrition will be maintained Outcome: Progressing   Problem: Coping: Goal: Level of anxiety will decrease Outcome: Progressing   Problem: Elimination: Goal: Will not experience complications related to bowel motility Outcome: Progressing Goal: Will not experience complications related to urinary retention Outcome: Progressing   Problem: Pain Management: Goal: General experience of comfort will improve Outcome: Progressing   Problem: Safety: Goal: Ability to remain free from injury will improve Outcome: Progressing   Problem: Skin Integrity: Goal: Risk for impaired skin integrity will decrease Outcome: Progressing   Problem: Safety: Goal: Non-violent Restraint(s) Outcome: Progressing   Cindy S. Loreli BSN, RN, GOLDMAN SACHS, CCRN 04/28/2023 5:50 AM

## 2023-04-29 ENCOUNTER — Other Ambulatory Visit: Payer: Self-pay | Admitting: Internal Medicine

## 2023-04-29 ENCOUNTER — Telehealth: Payer: Self-pay | Admitting: Internal Medicine

## 2023-04-29 DIAGNOSIS — J9601 Acute respiratory failure with hypoxia: Secondary | ICD-10-CM | POA: Diagnosis not present

## 2023-04-29 LAB — CULTURE, BLOOD (ROUTINE X 2): Culture: NO GROWTH

## 2023-04-29 LAB — BASIC METABOLIC PANEL
Anion gap: 11 (ref 5–15)
BUN: 43 mg/dL — ABNORMAL HIGH (ref 8–23)
CO2: 26 mmol/L (ref 22–32)
Calcium: 8.6 mg/dL — ABNORMAL LOW (ref 8.9–10.3)
Chloride: 97 mmol/L — ABNORMAL LOW (ref 98–111)
Creatinine, Ser: 1 mg/dL (ref 0.44–1.00)
GFR, Estimated: 54 mL/min — ABNORMAL LOW (ref >60)
Glucose, Bld: 150 mg/dL — ABNORMAL HIGH (ref 70–99)
Potassium: 4 mmol/L (ref 3.5–5.1)
Sodium: 134 mmol/L — ABNORMAL LOW (ref 135–145)

## 2023-04-29 MED ORDER — DONEPEZIL HCL 10 MG PO TABS: 10.0000 mg | ORAL_TABLET | Freq: Every day | ORAL | 0 refills | Status: DC

## 2023-04-29 NOTE — [place] FL2 (Signed)
   MEDICAID FL2 LEVEL OF CARE FORM     IDENTIFICATION  Patient Name: [name] Birthdate: 1930-07-27 Sex: female Admission Date (Current Location): 04/24/2023  [facility] and [place] Number:  Producer, Television/film/video and Address:  [facility],  [address] [place], [place] [address]      Provider Number: [id number]  Attending Physician Name and Address:  Cheryle Page, MD  Relative Name and Phone Number:  Cindia Ellison [phone]    Current Level of Care: Hospital Recommended Level of Care: Assisted Living Facility, Memory Care Prior Approval Number:    Date Approved/Denied:   PASRR Number:    Discharge Plan: Other (Comment) (Memory Care)    Current Diagnoses: Patient Active Problem List   Diagnosis Date Noted   Acute hypoxemic respiratory failure (HCC) 04/24/2023   Peripheral arterial disease (HCC) 08/27/2022   Pressure ulcer of heel, right, unstageable (HCC) 06/12/2022   Osteoarthritis 02/02/2022   Type 2 diabetes mellitus (HCC) 01/31/2022   Drug reaction 01/30/2022   Thrombocytopenia (HCC) 01/08/2022   Palliative care encounter 09/21/2021   Urinary frequency 09/18/2020   Aortic atherosclerosis (HCC) 08/07/2020   Dementia with behavioral disturbance (HCC) 07/27/2017   Chronic RUQ pain 05/28/2016   Right knee pain 06/14/2015   Diabetic neuropathy (HCC) 03/21/2014   Bunion 03/21/2014   PVD (pulmonary valve disease) 01/23/2014   Onychomycosis 12/20/2013   PVD (peripheral vascular disease) (HCC) 12/19/2013   Gout 12/13/2013   Depression    Lumbar canal stenosis 05/26/2013   Neuralgia neuritis, sciatic nerve 05/26/2013   Degenerative arthritis of lumbar spine with cord compression 04/24/2013   Arthritis of shoulder region, left 09/11/2011   Type II diabetes mellitus with neurological manifestations (HCC) 01/17/2009   Anemia 10/15/2008   CAD, NATIVE VESSEL 06/09/2008   Hyperlipidemia 06/15/2007   Essential hypertension 06/15/2007    GERD 06/15/2007   TUBULOVILLOUS ADENOMA, 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   Diverticulosis of colon 03/02/2007    Orientation RESPIRATION BLADDER Height & Weight     Self  Normal   Weight: 55.3 kg Height:  4' 11 (149.9 cm)  BEHAVIORAL SYMPTOMS/MOOD NEUROLOGICAL BOWEL NUTRITION STATUS      Incontinent (P) Diet (Regular)  AMBULATORY STATUS COMMUNICATION OF NEEDS Skin   Extensive Assist Verbally Normal                       Personal Care Assistance Level of Assistance  Bathing, Dressing, Feeding Bathing Assistance: Maximum assistance Feeding assistance: Maximum assistance Dressing Assistance: Maximum assistance     Functional Limitations Info  Sight, Hearing, Speech   Hearing Info: Adequate Speech Info: Adequate    SPECIAL CARE FACTORS FREQUENCY                       Contractures Contractures Info: (P) Not present    Additional Factors Info  (P) Code Status, Allergies Code Status Info: (P) DNR Allergies Info: (P) Depakote            Current Medications (04/29/2023):  This is the current hospital active medication list Current Facility-Administered Medications  Medication Dose Route Frequency Provider Last Rate Last Admin   acetaminophen  (TYLENOL ) tablet 650 mg  650 mg Oral Q6H PRN Pahwani, Rinka R, MD       Or   acetaminophen  (TYLENOL ) suppository 650 mg  650 mg Rectal Q6H PRN Pahwani, Rinka R, MD       amLODipine  (NORVASC ) tablet 5 mg  5 mg Oral BID Pokhrel, Laxman,  MD   5 mg at 04/28/23 2132   benzonatate  (TESSALON ) capsule 100 mg  100 mg Oral TID PRN Lama, Gagan S, MD   100 mg at 04/28/23 1116   busPIRone  (BUSPAR ) tablet 15 mg  15 mg Oral BID Pokhrel, Laxman, MD   15 mg at 04/28/23 2130   carvedilol  (COREG ) tablet 6.25 mg  6.25 mg Oral BID WC Pokhrel, Laxman, MD   6.25 mg at 04/28/23 1636   Chlorhexidine  Gluconate Cloth 2 % PADS 6 each  6 each Topical Daily Pokhrel, Laxman, MD   6 each at 04/28/23 2139   donepezil  (ARICEPT ) tablet 10 mg  10 mg Oral QHS Pokhrel,  Laxman, MD   10 mg at 04/28/23 2132   enoxaparin  (LOVENOX ) injection 30 mg  30 mg Subcutaneous Q24H Carolee Browning T, RPH   30 mg at 04/28/23 0947   guaiFENesin  (ROBITUSSIN) 100 MG/5ML liquid 15 mL  15 mL Oral Q4H Drusilla Sabas RAMAN, MD   15 mL at 04/29/23 0421   hydrALAZINE  (APRESOLINE ) injection 10 mg  10 mg Intravenous Q6H PRN Pokhrel, Laxman, MD   10 mg at 04/26/23 0119   ipratropium-albuterol  (DUONEB) 0.5-2.5 (3) MG/3ML nebulizer solution 3 mL  3 mL Nebulization Q4H PRN Pokhrel, Laxman, MD       memantine  (NAMENDA ) tablet 10 mg  10 mg Oral BID Pokhrel, Laxman, MD   10 mg at 04/28/23 2131   Oral care mouth rinse  15 mL Mouth Rinse PRN Pokhrel, Laxman, MD       pantoprazole  (PROTONIX ) EC tablet 40 mg  40 mg Oral Daily Pokhrel, Laxman, MD   40 mg at 04/28/23 0950   polyethylene glycol (MIRALAX  / GLYCOLAX ) packet 17 g  17 g Oral Daily PRN Pahwani, Rinka R, MD       QUEtiapine  (SEROQUEL ) tablet 50 mg  50 mg Oral QHS Pokhrel, Laxman, MD   50 mg at 04/28/23 2146   risperiDONE  (RISPERDAL ) tablet 0.5 mg  0.5 mg Oral BID Pokhrel, Laxman, MD   0.5 mg at 04/28/23 2146   traMADol  (ULTRAM ) tablet 50 mg  50 mg Oral TID PRN Pokhrel, Laxman, MD   50 mg at 04/29/23 0441     Discharge Medications: Please see discharge summary for a list of discharge medications.  Relevant Imaging Results:  Relevant Lab Results:   Additional Information    Vanetta Rule, Nathanel, RN

## 2023-04-29 NOTE — Progress Notes (Signed)
 Report called to [facility]. Patient discharged with daughter transporting to facility. All questions and concerns addressed prior to DC.

## 2023-04-29 NOTE — Progress Notes (Signed)
 Physical Therapy Treatment Patient Details Name: [name] MRN: [id number] DOB: 02/27/1931 Today's Date: 04/29/2023   History of Present Illness 89 y.o. brought to ED with c/o SOB. admitted with acute  hypoxemic respiratory failure in the setting of RSV bronchitis/pneumonia and CHF exacerbation emale PMH: dementia, hypertension, depression, CAD status post CABG, hyperlipidemia, type 2 diabetes, fibromyalgia    PT Comments  Patient progressing with mobility walking further into hallway today.  She was on RA initially and SpO2 94%; maintained in 90's with ambulation.  Daughter present and reports plans for transport to facility today.  PT will follow up if not d/c.     If plan is discharge home, recommend the following: A little help with walking and/or transfers;A little help with bathing/dressing/bathroom;Help with stairs or ramp for entrance;Supervision due to cognitive status;Assist for transportation   Can travel by private vehicle        Equipment Recommendations  None recommended by PT    Recommendations for Other Services       Precautions / Restrictions Precautions Precautions: Fall Precaution Comments: monitor sats     Mobility  Bed Mobility Overal bed mobility: Needs Assistance Bed Mobility: Supine to Sit     Supine to sit: Mod assist     General bed mobility comments: assist to lift trunk and scoot hips    Transfers Overall transfer level: Needs assistance Equipment used: Rolling walker (2 wheels) Transfers: Sit to/from Stand Sit to Stand: Mod assist, Min assist           General transfer comment: lifting help to stand initially, improved with subsequent attempts    Ambulation/Gait Ambulation/Gait assistance: Min assist Gait Distance (Feet): 60 Feet Assistive device: Rolling walker (2 wheels) Gait Pattern/deviations: Step-to pattern, Decreased stride length, Trunk flexed, Shuffle       General Gait Details: into hallway with assist to  maneuver walker in the room, then in hallway pt able to turn walker with min A for balance   Stairs             Wheelchair Mobility     Tilt Bed    Modified Rankin (Stroke Patients Only)       Balance Overall balance assessment: Needs assistance   Sitting balance-Leahy Scale: Fair     Standing balance support: Bilateral upper extremity supported, Reliant on assistive device for balance Standing balance-Leahy Scale: Poor Standing balance comment: UE support for balance                            Cognition Arousal: Alert Behavior During Therapy: WFL for tasks assessed/performed Overall Cognitive Status: History of cognitive impairments - at baseline                                          Exercises      General Comments General comments (skin integrity, edema, etc.): SpO2 on RA 94% prior to ambulation, 91% after with HR in 60's      Pertinent Vitals/Pain Pain Assessment Pain Assessment: Faces Faces Pain Scale: Hurts a little bit Pain Location: R side at times Pain Descriptors / Indicators: Grimacing, Guarding Pain Intervention(s): Monitored during session, Repositioned    Home Living                          Prior Function  PT Goals (current goals can now be found in the care plan section) Progress towards PT goals: Progressing toward goals    Frequency    Min 1X/week      PT Plan      Co-evaluation              AM-PAC PT 6 Clicks Mobility   Outcome Measure  Help needed turning from your back to your side while in a flat bed without using bedrails?: A Little Help needed moving from lying on your back to sitting on the side of a flat bed without using bedrails?: A Lot Help needed moving to and from a bed to a chair (including a wheelchair)?: A Little Help needed standing up from a chair using your arms (e.g., wheelchair or bedside chair)?: A Little Help needed to walk in hospital  room?: A Little Help needed climbing 3-5 steps with a railing? : Total 6 Click Score: 15    End of Session Equipment Utilized During Treatment: Gait belt Activity Tolerance: Patient tolerated treatment well Patient left: in chair;with chair alarm set;with call bell/phone within reach;with family/visitor present   PT Visit Diagnosis: Other abnormalities of gait and mobility (R26.89)     Time: 8853-8794 PT Time Calculation (min) (ACUTE ONLY): 19 min  Charges:    $Gait Training: 8-22 mins PT General Charges $$ ACUTE PT VISIT: 1 Visit                     Micheline Portal, PT Acute Rehabilitation Services Office:[phone] 04/29/2023    Montie Portal 04/29/2023, 12:27 PM

## 2023-04-29 NOTE — Telephone Encounter (Signed)
 Noted.

## 2023-04-29 NOTE — Telephone Encounter (Signed)
 Copied from CRM [id number]. Topic: General - Other >> Apr 29, 2023 11:05 AM Irine Seal wrote: Reason for CRM: Rayne Du [phone] stated they received a palliative care referral and will be following it

## 2023-04-29 NOTE — TOC Transition Note (Addendum)
 Transition of Care [facility]) - Discharge Note   Patient Details  Name: [name] MRN: [id number] Date of Birth: July 07, 1930  [facility] CM/SW Contact:  Bascom Service, RN Phone Number: 04/29/2023, 10:52 AM   Clinical Narrative: d/c back to [facility]/Memory care rep [facility] aware-fl2,d/c summary faxed w/confirmation [phone] w/[facility] rep [name] aware. Otpt palliative care to f/u with dtr [name] at a later time when [name] feels appropriate-[facility] rep [name] aware. [name](dtr) to transport back on own. No further CM needs. -1p [address] rep [name] has received signed fl2, & d/c summary-accepted & agree to d/c back-going to [facility] rm#[id number], nsg call report tel#[phone].[name](dtr) to transport back on own. No further CM needs.    Final next level of care: Memory Care Barriers to Discharge: No Barriers Identified   Patient Goals and CMS Choice Patient states their goals for this hospitalization and ongoing recovery are:: Return to [facility]/memory care w/HHPT-[facility] CMS Medicare.gov Compare Post Acute Care list provided to:: Patient Represenative (must comment) ([name](dtr)) Choice offered to / list presented to : Adult Children [place] ownership interest in [facility].provided to:: Adult Children    Discharge Placement                       Discharge Plan and Services Additional resources added to the After Visit Summary for     Discharge Planning Services: CM Consult Post Acute Care Choice: Home Health                    HH Arranged: PT [facility] Agency: [facility] Date [facility] Agency Contacted: 04/29/23 Time HH Agency Contacted: 1052 Representative spoke with at [facility] Agency: [name]  Social Drivers of Health (SDOH) Interventions SDOH Screenings   Food Insecurity: No Food Insecurity (04/24/2023)  Housing: Low Risk  (04/24/2023)  Transportation Needs: No Transportation Needs (04/24/2023)  Utilities: Not  At Risk (04/24/2023)  Depression (PHQ2-9): Low Risk  (08/08/2020)  Social Connections: Patient Unable To Answer (04/24/2023)  Tobacco Use: Medium Risk (04/24/2023)     Readmission Risk Interventions     No data to display

## 2023-04-29 NOTE — Discharge Summary (Signed)
 Physician Discharge Summary  Aleeta Schmaltz FMW:[id number] DOB: [birth year] DOA: 04/24/2023  PCP: Geofm Glade PARAS, MD  Admit date: 04/24/2023 Discharge date: 04/29/2023  Admitted From: ALF Disposition: ALF  Recommendations for Outpatient Follow-up:  Follow up with PCP in 1 week with repeat CBC/BMP Recommend outpatient evaluation and follow-up by palliative care Follow up in ED if symptoms worsen or new appear   Home Health: Home health PT Equipment/Devices: None  Discharge Condition: Guarded CODE STATUS: DNR Diet recommendation: Heart healthy  Brief/Interim Summary: 89 y.o. female with medical history significant of dementia, hypertension, depression, CAD status post CABG, hyperlipidemia, type 2 diabetes, fibromyalgia presented to hospital with shortness of breath.  On presentation, chest x-ray showed bilateral lower lung opacities.  Respiratory viral panel was positive for RSV.  CT chest showed no PE but findings concerning for congestive heart failure, tracheomalacia and multiple liver masses.  She was started on IV Rocephin , Zithromax , oral steroids and also received diuretics during the hospitalization.  Patient/family opted not to pursue investigation/treatment of liver masses.  During the hospitalization, her condition has improved.  She has completed antibiotic treatment and steroids have been discontinued.  Lasix  has also been discontinued.  She will be discharged today with outpatient follow-up with PCP.  Discharge Diagnoses:   Acute respiratory failure with hypoxia RSV bronchitis/pneumonia Probable acute diastolic CHF -Initially required supplemental oxygen.  Currently on room air. -Treated with IV antibiotics and completed course.  Steroids have also been discontinued. -Echo showed EF of 65%.  Treated with IV Lasix .  Has diuresed well.  Diuretics have been discontinued.  Follow-up with PCP regarding further need for diuretics -Discharge patient to ALF today as patient remains  on room air.  AKI -Resolved.  Outpatient follow-up  Liver lesions -Concern for widespread metastatic disease.  Patient/family have opted not to pursue investigation/treatment of liver masses  Hypertension -Continue home regimen.  Outpatient follow-up with PCP  Dementia/mood disorder with agitation -Can continue Aricept , Seroquel , Namenda .  Outpatient follow-up with PCP and/or psychiatry  GERD -Continue PPI  CAD status post CABG -Stable.  Continue aspirin  and beta-blocker  Chronic pain -Continue gabapentin   Diabetes mellitus type 2 -Diet controlled.  Outpatient follow-up with PCP.   Discharge Instructions  Discharge Instructions     Amb Referral to Palliative Care   Complete by: As directed    Diet - low sodium heart healthy   Complete by: As directed    Increase activity slowly   Complete by: As directed    No wound care   Complete by: As directed       Allergies as of 04/29/2023       Reactions   Depakote  [divalproex  Sodium] Other (See Comments)   Had worsening neutropenia and thrombocytopenia on medication.  Darice Grayer FNP-C        Medication List     TAKE these medications    acetaminophen  500 MG tablet Commonly known as: TYLENOL  Take 1,000 mg by mouth 2 (two) times daily.   amLODipine  5 MG tablet Commonly known as: NORVASC  Take 1 tablet (5 mg total) by mouth 2 (two) times daily.   aspirin  EC 81 MG tablet Take 81 mg by mouth daily.   Biofreeze 4 % Gel Generic drug: Menthol  (Topical Analgesic) Apply 1 Application topically in the morning, at noon, and at bedtime. To mid back and right wrist   busPIRone  15 MG tablet Commonly known as: BUSPAR  TAKE 1 TABLET BY MOUTH TWICE A DAY   carvedilol  6.25 MG tablet Commonly  known as: COREG  TAKE 1 TABLET BY MOUTH TWICE A DAY   donepezil  10 MG tablet Commonly known as: ARICEPT  TAKE 1 TABLET BY MOUTH DAILY What changed:  how much to take how to take this when to take this additional  instructions   gabapentin  300 MG capsule Commonly known as: NEURONTIN  Take 1 capsule (300 mg total) by mouth at bedtime.   guaifenesin  400 MG Tabs tablet Commonly known as: HUMIBID E Take 400 mg by mouth in the morning and at bedtime. For 5 days starting 04/21/23   meloxicam  7.5 MG tablet Commonly known as: MOBIC  Take 7.5 mg by mouth daily.   memantine  10 MG tablet Commonly known as: NAMENDA  TAKE 1 TABLET BY MOUTH 2 TIMES A DAY   omeprazole  20 MG capsule Commonly known as: PRILOSEC Take 40 mg by mouth daily.   QUEtiapine  50 MG tablet Commonly known as: SEROQUEL  TAKE 1 TABLET BY MOUTH AT BEDTIME   risperiDONE  0.5 MG tablet Commonly known as: RISPERDAL  Take 0.5 mg by mouth 2 (two) times daily.   traMADol  50 MG tablet Commonly known as: ULTRAM  Take 50 mg by mouth 3 (three) times daily as needed for moderate pain.          Follow-up Information     Geofm Glade PARAS, MD. Schedule an appointment as soon as possible for a visit in 1 week(s).   Specialty: Internal Medicine Contact information: [address] [place] [address] [phone]                Allergies  Allergen Reactions   Depakote  [Divalproex  Sodium] Other (See Comments)    Had worsening neutropenia and thrombocytopenia on medication.  Darice Grayer FNP-C    Consultations: None   Procedures/Studies: DG Chest Port 1 View Result Date: 04/27/2023 CLINICAL DATA:  CHF EXAM: PORTABLE CHEST 1 VIEW COMPARISON:  04/24/2023 FINDINGS: Gross cardiomegaly status post median sternotomy and CABG. Unchanged bibasilar scarring or atelectasis. Status post bilateral shoulder arthroplasty. No acute osseous findings. IMPRESSION: 1. Gross cardiomegaly status post median sternotomy and CABG. 2. Unchanged bibasilar scarring or atelectasis. No acute abnormality of the lungs. Electronically Signed   By: Marolyn JONETTA Jaksch M.D.   On: 04/27/2023 09:27   ECHOCARDIOGRAM COMPLETE Result Date: 04/24/2023    ECHOCARDIOGRAM  REPORT   Patient Name:   [name] Date of Exam: 04/24/2023 Medical Rec #:  [id number]           Height:       59.0 in Accession #:    [id number]          Weight:       132.3 lb Date of Birth:  06-Mar-1931           BSA:          1.547 m Patient Age:    89 years            BP:           142/80 mmHg Patient Gender: F                   HR:           77 bpm. Exam Location:  Inpatient Procedure: 2D Echo, Cardiac Doppler and Color Doppler Indications:    Congenital Heart Disease Q24.0  History:        Patient has prior history of Echocardiogram examinations and                 Patient has  no prior history of Echocardiogram examinations.                 CAD, Prior CABG; Risk Factors:Diabetes.  Sonographer:    Jayson Gaskins Referring Phys: [id number] VELNA SAUNDERS PAHWANI IMPRESSIONS  1. Left ventricular ejection fraction, by estimation, is 65 to 70%. The left ventricle has normal function. The left ventricle has no regional wall motion abnormalities. Left ventricular diastolic parameters are consistent with Grade I diastolic dysfunction (impaired relaxation).  2. RV-RA gradient 27 mmHg suggests normal to mildly increased estimated RVSP depending on CVP. Right ventricular systolic function is normal. The right ventricular size is mildly enlarged.  3. Left atrial size was mildly dilated.  4. The mitral valve is degenerative. Trivial mitral valve regurgitation.  5. The aortic valve is tricuspid. There is mild calcification of the aortic valve. Aortic valve regurgitation is not visualized. Aortic valve sclerosis/calcification is present, without any evidence of aortic stenosis. Aortic valve mean gradient measures 4.0 mmHg.  6. Unable to estimate CVP. Comparison(s): No prior Echocardiogram. FINDINGS  Left Ventricle: Left ventricular ejection fraction, by estimation, is 65 to 70%. The left ventricle has normal function. The left ventricle has no regional wall motion abnormalities. The left ventricular internal cavity size was  normal in size. There is  no left ventricular hypertrophy. Left ventricular diastolic parameters are consistent with Grade I diastolic dysfunction (impaired relaxation). Right Ventricle: RV-RA gradient 27 mmHg suggests normal to mildly increased estimated RVSP depending on CVP. The right ventricular size is mildly enlarged. No increase in right ventricular wall thickness. Right ventricular systolic function is normal. Left Atrium: Left atrial size was mildly dilated. Right Atrium: Right atrial size was normal in size. Pericardium: There is no evidence of pericardial effusion. Mitral Valve: The mitral valve is degenerative in appearance. There is mild calcification of the mitral valve leaflet(s). Mild mitral annular calcification. Trivial mitral valve regurgitation. Tricuspid Valve: The tricuspid valve is grossly normal. Tricuspid valve regurgitation is trivial. Aortic Valve: The aortic valve is tricuspid. There is mild calcification of the aortic valve. There is mild aortic valve annular calcification. Aortic valve regurgitation is not visualized. Aortic valve sclerosis/calcification is present, without any evidence of aortic stenosis. Aortic valve mean gradient measures 4.0 mmHg. Aortic valve peak gradient measures 7.7 mmHg. Aortic valve area, by VTI measures 2.04 cm. Pulmonic Valve: The pulmonic valve was grossly normal. Pulmonic valve regurgitation is trivial. Aorta: The aortic root is normal in size and structure. Venous: Unable to estimate CVP. The inferior vena cava was not well visualized. IAS/Shunts: No atrial level shunt detected by color flow Doppler.  LEFT VENTRICLE PLAX 2D LVIDd:         3.10 cm   Diastology LVIDs:         2.30 cm   LV e' medial:    4.03 cm/s LV PW:         0.90 cm   LV E/e' medial:  20.2 LV IVS:        0.90 cm   LV e' lateral:   5.77 cm/s LVOT diam:     1.80 cm   LV E/e' lateral: 14.1 LV SV:         62 LV SV Index:   40 LVOT Area:     2.54 cm  RIGHT VENTRICLE RV S prime:     8.70 cm/s  TAPSE (M-mode): 1.6 cm LEFT ATRIUM             Index  RIGHT ATRIUM           Index LA Vol (A2C):   69.7 ml 45.05 ml/m  RA Area:     16.00 cm LA Vol (A4C):   43.0 ml 27.79 ml/m  RA Volume:   38.60 ml  24.95 ml/m LA Biplane Vol: 58.9 ml 38.07 ml/m  AORTIC VALVE AV Area (Vmax):    2.01 cm AV Area (Vmean):   1.94 cm AV Area (VTI):     2.04 cm AV Vmax:           139.00 cm/s AV Vmean:          97.300 cm/s AV VTI:            0.303 m AV Peak Grad:      7.7 mmHg AV Mean Grad:      4.0 mmHg LVOT Vmax:         110.00 cm/s LVOT Vmean:        74.200 cm/s LVOT VTI:          0.243 m LVOT/AV VTI ratio: 0.80  AORTA Ao Root diam: 2.60 cm MITRAL VALVE                TRICUSPID VALVE MV Area (PHT): 2.46 cm     TR Peak grad:   27.2 mmHg MV Decel Time: 308 msec     TR Vmax:        261.00 cm/s MV E velocity: 81.40 cm/s MV A velocity: 114.00 cm/s  SHUNTS MV E/A ratio:  0.71         Systemic VTI:  0.24 m                             Systemic Diam: 1.80 cm Jayson Sierras MD Electronically signed by Jayson Sierras MD Signature Date/Time: 04/24/2023/5:23:52 PM    Final    CT Angio Chest PE W and/or Wo Contrast Result Date: 04/24/2023 CLINICAL DATA:  89 year old female with history of shortness of breath for the past 3 days. EXAM: CT ANGIOGRAPHY CHEST WITH CONTRAST TECHNIQUE: Multidetector CT imaging of the chest was performed using the standard protocol during bolus administration of intravenous contrast. Multiplanar CT image reconstructions and MIPs were obtained to evaluate the vascular anatomy. RADIATION DOSE REDUCTION: This exam was performed according to the departmental dose-optimization program which includes automated exposure control, adjustment of the mA and/or kV according to patient size and/or use of iterative reconstruction technique. CONTRAST:  75mL OMNIPAQUE  IOHEXOL  350 MG/ML SOLN COMPARISON:  No priors. FINDINGS: Cardiovascular: Study is slightly limited by patient respiratory motion. With these limitations in  mind, there is no central, lobar or segmental sized filling defect in the pulmonary arteries to suggest clinically significant pulmonary embolism. Smaller subsegmental sized filling defects are not entirely excluded. Heart size is mildly enlarged. There is no significant pericardial fluid, thickening or pericardial calcification. There is aortic atherosclerosis, as well as atherosclerosis of the great vessels of the mediastinum and the coronary arteries, including calcified atherosclerotic plaque in the left main, left anterior descending, left circumflex and right coronary arteries. Status post median sternotomy for CABG. Mediastinum/Nodes: No pathologically enlarged mediastinal or hilar lymph nodes. Esophagus is unremarkable in appearance. Lungs/Pleura: Partial collapse of the trachea and mainstem bronchi indicative of tracheobronchomalacia. Patchy areas of ground-glass attenuation, septal thickening and scattered patchy consolidation in the lungs bilaterally, favored to reflect a background of pulmonary edema (although multilobar bilateral bronchopneumonia could have a similar  appearance). No pleural effusions. Upper Abdomen: Multiple large masses are noted within the liver, largest of which in the superior aspect of the right lobe of the liver measures up to 13.8 x 6.8 cm (axial image 90 of series 10), incompletely characterized on today's noncontrast CT examination, but concerning for multifocal hepatic metastases. Musculoskeletal: Status post bilateral shoulder arthroplasty. Median sternotomy wires. There are no aggressive appearing lytic or blastic lesions noted in the visualized portions of the skeleton. Review of the MIP images confirms the above findings. IMPRESSION: 1. Limited study demonstrating no central, lobar or segmental sized pulmonary embolism. 2. The overall appearance of the chest suggests underlying congestive heart failure, as above. 3. Tracheobronchomalacia. 4. Multiple indeterminate liver  masses highly concerning for widespread metastatic disease to the liver. Further evaluation with nonemergent abdominal MRI with and without IV gadolinium should be considered to better evaluate these findings. Aortic Atherosclerosis (ICD10-I70.0). Electronically Signed   By: Toribio Aye M.D.   On: 04/24/2023 05:19   DG Chest Portable 1 View Result Date: 04/24/2023 CLINICAL DATA:  Shortness of breath EXAM: PORTABLE CHEST 1 VIEW COMPARISON:  01/30/2022 FINDINGS: Low lung volumes accentuate cardiomediastinal silhouette and pulmonary vascularity. Bilateral mid and lower lung airspace opacities. No definite pleural effusion or pneumothorax. Sternotomy and CABG. Aortic atherosclerotic calcification. Bilateral reverse TSH. IMPRESSION: Hypoinflation. Bilateral mid and lower lung airspace opacities, atelectasis versus pneumonia. Electronically Signed   By: Norman Gatlin M.D.   On: 04/24/2023 02:14      Subjective: Patient seen and examined at bedside.  No fever, seizures, agitation reported.   Discharge Exam: Vitals:   04/28/23 1900 04/29/23 0420  BP: 122/70 (!) 156/75  Pulse: 70 65  Resp: 20 17  Temp: 97.9 F (36.6 C) 97.6 F (36.4 C)  SpO2: 94% 90%    General: Awake, slow to respond, poor historian.  On room air.  Elderly female lying in bed.  Slightly confused. Cardiovascular: rate controlled, S1/S2 + Respiratory: bilateral decreased breath sounds at bases with some scattered crackles Abdominal: Soft, NT, ND, bowel sounds + Extremities: Trace lower extremity edema; no cyanosis    The results of significant diagnostics from this hospitalization (including imaging, microbiology, ancillary and laboratory) are listed below for reference.     Microbiology: Recent Results (from the past 240 hours)  Resp panel by RT-PCR (RSV, Flu A&B, Covid)     Status: Abnormal   Collection Time: 04/24/23  2:20 AM  Result Value Ref Range Status   SARS Coronavirus 2 by RT PCR NEGATIVE NEGATIVE Final     Comment: (NOTE) SARS-CoV-2 target nucleic acids are NOT DETECTED.  The SARS-CoV-2 RNA is generally detectable in upper respiratory specimens during the acute phase of infection. The lowest concentration of SARS-CoV-2 viral copies this assay can detect is 138 copies/mL. A negative result does not preclude SARS-Cov-2 infection and should not be used as the sole basis for treatment or other patient management decisions. A negative result may occur with  improper specimen collection/handling, submission of specimen other than nasopharyngeal swab, presence of viral mutation(s) within the areas targeted by this assay, and inadequate number of viral copies(<138 copies/mL). A negative result must be combined with clinical observations, patient history, and epidemiological information. The expected result is Negative.  Fact Sheet for Patients:  bloggercourse.com  Fact Sheet for Healthcare Providers:  seriousbroker.it  This test is no t yet approved or cleared by the [place]  FDA and  has been authorized for detection and/or diagnosis of SARS-CoV-2 by FDA under  an Emergency Use Authorization (EUA). This EUA will remain  in effect (meaning this test can be used) for the duration of the COVID-19 declaration under Section 564(b)(1) of the Act, 21 U.S.C.section 360bbb-3(b)(1), unless the authorization is terminated  or revoked sooner.       Influenza A by PCR NEGATIVE NEGATIVE Final   Influenza B by PCR NEGATIVE NEGATIVE Final    Comment: (NOTE) The Xpert Xpress SARS-CoV-2/FLU/RSV plus assay is intended as an aid in the diagnosis of influenza from Nasopharyngeal swab specimens and should not be used as a sole basis for treatment. Nasal washings and aspirates are unacceptable for Xpert Xpress SARS-CoV-2/FLU/RSV testing.  Fact Sheet for Patients: bloggercourse.com  Fact Sheet for Healthcare  Providers: seriousbroker.it  This test is not yet approved or cleared by the [place]  FDA and has been authorized for detection and/or diagnosis of SARS-CoV-2 by FDA under an Emergency Use Authorization (EUA). This EUA will remain in effect (meaning this test can be used) for the duration of the COVID-19 declaration under Section 564(b)(1) of the Act, 21 U.S.C. section 360bbb-3(b)(1), unless the authorization is terminated or revoked.     Resp Syncytial Virus by PCR POSITIVE (A) NEGATIVE Final    Comment: (NOTE) Fact Sheet for Patients: bloggercourse.com  Fact Sheet for Healthcare Providers: seriousbroker.it  This test is not yet approved or cleared by the [place]  FDA and has been authorized for detection and/or diagnosis of SARS-CoV-2 by FDA under an Emergency Use Authorization (EUA). This EUA will remain in effect (meaning this test can be used) for the duration of the COVID-19 declaration under Section 564(b)(1) of the Act, 21 U.S.C. section 360bbb-3(b)(1), unless the authorization is terminated or revoked.  Performed at [facility], [address], [place], [place] [address]   Blood culture (routine x 2)     Status: None   Collection Time: 04/24/23  3:09 AM   Specimen: BLOOD  Result Value Ref Range Status   Specimen Description   Final    BLOOD SITE NOT SPECIFIED Performed at [facility], [address] [place], [place] [address]    Special Requests   Final    BOTTLES DRAWN AEROBIC ONLY Blood Culture results may not be optimal due to an inadequate volume of blood received in culture bottles Performed at [facility], [address], [place], [place] [address]    Culture   Final    NO GROWTH 5 DAYS Performed at [facility], [address], [place], [place] [address]    Report Status 04/29/2023 FINAL  Final  MRSA Next  Gen by PCR, Nasal     Status: None   Collection Time: 04/24/23  4:55 PM   Specimen: Nasal Mucosa; Nasal Swab  Result Value Ref Range Status   MRSA by PCR Next Gen NOT DETECTED NOT DETECTED Final    Comment: (NOTE) The GeneXpert MRSA Assay (FDA approved for NASAL specimens only), is one component of a comprehensive MRSA colonization surveillance program. It is not intended to diagnose MRSA infection nor to guide or monitor treatment for MRSA infections. Test performance is not FDA approved in patients less than 1 years old. Performed at [facility], [address], [place], [place] [address]   Blood culture (routine x 2)     Status: None   Collection Time: 04/24/23  5:54 PM   Specimen: BLOOD RIGHT HAND  Result Value Ref Range Status   Specimen Description  Final    BLOOD RIGHT HAND Performed at [facility], [address], [place], [place] [address]    Special Requests   Final    BOTTLES DRAWN AEROBIC ONLY Blood Culture results may not be optimal due to an inadequate volume of blood received in culture bottles Performed at [facility], [address], [place], [place] [address]    Culture   Final    NO GROWTH 5 DAYS Performed at [facility], [address], [place], [place] [address]    Report Status 04/29/2023 FINAL  Final     Labs: BNP (last 3 results) Recent Labs    04/24/23 0144  BNP 107.2*   Basic Metabolic Panel: Recent Labs  Lab 04/24/23 1028 04/25/23 0348 04/26/23 0326 04/27/23 0556 04/28/23 0742 04/29/23 0504  NA  --  142 136 133* 134* 134*  K  --  3.3* 4.0 3.9 3.3* 4.0  CL  --  103 97* 96* 97* 97*  CO2  --  28 24 23 26 26   GLUCOSE  --  131* 156* 158* 142* 150*  BUN  --  19 23 44* 54* 43*  CREATININE 0.70 0.60 0.99 1.20* 0.91 1.00  CALCIUM   --  9.1 9.5 8.8* 8.6* 8.6*  MG 2.2  --  2.0  --   --   --   PHOS 4.2  --   --   --   --   --    Liver Function Tests: Recent Labs  Lab 04/24/23 0646  04/25/23 0348 04/27/23 0556 04/28/23 0742  AST 22 21 16 17   ALT 14 16 16 19   ALKPHOS 66 67 62 58  BILITOT 0.9 0.8 0.9 0.8  PROT 7.3 7.1 7.5 7.0  ALBUMIN 3.7 3.6 3.5 3.4*   Recent Labs  Lab 04/24/23 0646  LIPASE 28   No results for input(s): AMMONIA in the last 168 hours. CBC: Recent Labs  Lab 04/24/23 0144 04/24/23 1307 04/25/23 0348 04/26/23 0326 04/27/23 0300  WBC 5.1 5.5 8.2 5.0 6.0  NEUTROABS 2.7  --   --   --   --   HGB 13.0 13.5 12.9 15.8* 16.5*  HCT 42.2 43.3 40.9 49.2* 50.7*  MCV 100.0 98.9 96.9 95.9 93.9  PLT 144* 164 166 165 142*   Cardiac Enzymes: No results for input(s): CKTOTAL, CKMB, CKMBINDEX, TROPONINI in the last 168 hours. BNP: Invalid input(s): POCBNP CBG: No results for input(s): GLUCAP in the last 168 hours. D-Dimer No results for input(s): DDIMER in the last 72 hours. Hgb A1c No results for input(s): HGBA1C in the last 72 hours. Lipid Profile No results for input(s): CHOL, HDL, LDLCALC, TRIG, CHOLHDL, LDLDIRECT in the last 72 hours. Thyroid  function studies No results for input(s): TSH, T4TOTAL, T3FREE, THYROIDAB in the last 72 hours.  Invalid input(s): FREET3 Anemia work up No results for input(s): VITAMINB12, FOLATE, FERRITIN, TIBC, IRON , RETICCTPCT in the last 72 hours. Urinalysis    Component Value Date/Time   COLORURINE YELLOW 04/24/2023 1012   APPEARANCEUR CLEAR 04/24/2023 1012   LABSPEC 1.039 (H) 04/24/2023 1012   PHURINE 5.0 04/24/2023 1012   GLUCOSEU NEGATIVE 04/24/2023 1012   GLUCOSEU NEGATIVE 02/27/2021 1046   HGBUR NEGATIVE 04/24/2023 1012   BILIRUBINUR NEGATIVE 04/24/2023 1012   KETONESUR NEGATIVE 04/24/2023 1012   PROTEINUR NEGATIVE 04/24/2023 1012   UROBILINOGEN 0.2 02/27/2021 1046   NITRITE NEGATIVE 04/24/2023 1012   LEUKOCYTESUR NEGATIVE 04/24/2023 1012   Sepsis Labs Recent Labs  Lab  04/24/23 1307 04/25/23 0348 04/26/23 0326 04/27/23 0300  WBC 5.5 8.2  5.0 6.0   Microbiology Recent Results (from the past 240 hours)  Resp panel by RT-PCR (RSV, Flu A&B, Covid)     Status: Abnormal   Collection Time: 04/24/23  2:20 AM  Result Value Ref Range Status   SARS Coronavirus 2 by RT PCR NEGATIVE NEGATIVE Final    Comment: (NOTE) SARS-CoV-2 target nucleic acids are NOT DETECTED.  The SARS-CoV-2 RNA is generally detectable in upper respiratory specimens during the acute phase of infection. The lowest concentration of SARS-CoV-2 viral copies this assay can detect is 138 copies/mL. A negative result does not preclude SARS-Cov-2 infection and should not be used as the sole basis for treatment or other patient management decisions. A negative result may occur with  improper specimen collection/handling, submission of specimen other than nasopharyngeal swab, presence of viral mutation(s) within the areas targeted by this assay, and inadequate number of viral copies(<138 copies/mL). A negative result must be combined with clinical observations, patient history, and epidemiological information. The expected result is Negative.  Fact Sheet for Patients:  bloggercourse.com  Fact Sheet for Healthcare Providers:  seriousbroker.it  This test is no t yet approved or cleared by the [place]  FDA and  has been authorized for detection and/or diagnosis of SARS-CoV-2 by FDA under an Emergency Use Authorization (EUA). This EUA will remain  in effect (meaning this test can be used) for the duration of the COVID-19 declaration under Section 564(b)(1) of the Act, 21 U.S.C.section 360bbb-3(b)(1), unless the authorization is terminated  or revoked sooner.       Influenza A by PCR NEGATIVE NEGATIVE Final   Influenza B by PCR NEGATIVE NEGATIVE Final    Comment: (NOTE) The Xpert Xpress SARS-CoV-2/FLU/RSV plus assay is intended as an aid in the diagnosis of influenza from Nasopharyngeal swab specimens  and should not be used as a sole basis for treatment. Nasal washings and aspirates are unacceptable for Xpert Xpress SARS-CoV-2/FLU/RSV testing.  Fact Sheet for Patients: bloggercourse.com  Fact Sheet for Healthcare Providers: seriousbroker.it  This test is not yet approved or cleared by the [place]  FDA and has been authorized for detection and/or diagnosis of SARS-CoV-2 by FDA under an Emergency Use Authorization (EUA). This EUA will remain in effect (meaning this test can be used) for the duration of the COVID-19 declaration under Section 564(b)(1) of the Act, 21 U.S.C. section 360bbb-3(b)(1), unless the authorization is terminated or revoked.     Resp Syncytial Virus by PCR POSITIVE (A) NEGATIVE Final    Comment: (NOTE) Fact Sheet for Patients: bloggercourse.com  Fact Sheet for Healthcare Providers: seriousbroker.it  This test is not yet approved or cleared by the [place]  FDA and has been authorized for detection and/or diagnosis of SARS-CoV-2 by FDA under an Emergency Use Authorization (EUA). This EUA will remain in effect (meaning this test can be used) for the duration of the COVID-19 declaration under Section 564(b)(1) of the Act, 21 U.S.C. section 360bbb-3(b)(1), unless the authorization is terminated or revoked.  Performed at [facility], [address] [place], [place] [address]   Blood culture (routine x 2)     Status: None   Collection Time: 04/24/23  3:09 AM   Specimen: BLOOD  Result Value Ref Range Status   Specimen Description   Final    BLOOD SITE NOT SPECIFIED Performed at [facility], [address], [place], [place] [address]    Special Requests  Final    BOTTLES DRAWN AEROBIC ONLY Blood Culture results may not be optimal due to an inadequate volume of blood received in culture bottles Performed at  [facility], [address] [place], [place] [address]    Culture   Final    NO GROWTH 5 DAYS Performed at [facility], [address] [place], [place] [address]    Report Status 04/29/2023 FINAL  Final  MRSA Next Gen by PCR, Nasal     Status: None   Collection Time: 04/24/23  4:55 PM   Specimen: Nasal Mucosa; Nasal Swab  Result Value Ref Range Status   MRSA by PCR Next Gen NOT DETECTED NOT DETECTED Final    Comment: (NOTE) The GeneXpert MRSA Assay (FDA approved for NASAL specimens only), is one component of a comprehensive MRSA colonization surveillance program. It is not intended to diagnose MRSA infection nor to guide or monitor treatment for MRSA infections. Test performance is not FDA approved in patients less than 12 years old. Performed at [facility], [address], [place], [place] [address]   Blood culture (routine x 2)     Status: None   Collection Time: 04/24/23  5:54 PM   Specimen: BLOOD RIGHT HAND  Result Value Ref Range Status   Specimen Description   Final    BLOOD RIGHT HAND Performed at [facility], [address], [place], [place] [address]    Special Requests   Final    BOTTLES DRAWN AEROBIC ONLY Blood Culture results may not be optimal due to an inadequate volume of blood received in culture bottles Performed at [facility], [address], [place], [place] [address]    Culture   Final    NO GROWTH 5 DAYS Performed at [facility], [address] [place], [place] [address]    Report Status 04/29/2023 FINAL  Final     Time coordinating discharge: 35 minutes  SIGNED:   Sophie Mao, MD  [facility] 04/29/2023, 9:10 AM

## 2023-05-04 ENCOUNTER — Other Ambulatory Visit: Payer: Self-pay | Admitting: Neurology

## 2023-05-04 DIAGNOSIS — F01B3 Vascular dementia, moderate, with mood disturbance: Secondary | ICD-10-CM

## 2023-05-07 ENCOUNTER — Telehealth: Payer: Self-pay | Admitting: Radiology

## 2023-05-07 NOTE — Telephone Encounter (Signed)
Verbals given to [employer] today

## 2023-05-07 NOTE — Telephone Encounter (Signed)
Copied from CRM [id number]. Topic: Clinical - Home Health Verbal Orders >> May 07, 2023 10:48 AM Elita Quick wrote: Caller/Agency: [facility] Callback Number: [phone] PAMELA Service Requested: Physical Therapy Frequency: 1 WEEK 2 WEEK 3 AND THEN ONE EVERY 2 WEEKS 4. ( NEW REQUEST- REQUESTING ONE WEEK ONE THE WEEK OF 06/20/23, ONE MORE WEEK) Any new concerns about the patient? Yes

## 2023-05-07 NOTE — Telephone Encounter (Signed)
Okay for orders? 

## 2023-05-18 ENCOUNTER — Other Ambulatory Visit: Payer: Self-pay | Admitting: Internal Medicine

## 2023-05-26 ENCOUNTER — Other Ambulatory Visit: Payer: Self-pay

## 2023-05-26 MED ORDER — OMEPRAZOLE 20 MG PO CPDR: 40.0000 mg | DELAYED_RELEASE_CAPSULE | Freq: Every day | ORAL | 5 refills | Status: DC

## 2023-06-27 ENCOUNTER — Other Ambulatory Visit: Payer: Self-pay | Admitting: Internal Medicine

## 2023-06-27 DIAGNOSIS — I25810 Atherosclerosis of coronary artery bypass graft(s) without angina pectoris: Secondary | ICD-10-CM

## 2023-06-30 ENCOUNTER — Ambulatory Visit: Payer: Medicare Other | Admitting: Internal Medicine

## 2023-07-11 ENCOUNTER — Other Ambulatory Visit: Payer: Self-pay | Admitting: Internal Medicine

## 2023-07-25 ENCOUNTER — Encounter: Payer: Self-pay | Admitting: Neurology

## 2023-07-27 ENCOUNTER — Ambulatory Visit (INDEPENDENT_AMBULATORY_CARE_PROVIDER_SITE_OTHER): Payer: Medicare Other | Admitting: Neurology

## 2023-07-27 ENCOUNTER — Encounter: Payer: Self-pay | Admitting: Neurology

## 2023-07-27 VITALS — BP 130/58 | HR 52 | Wt 124.6 lb

## 2023-07-27 DIAGNOSIS — F01B3 Vascular dementia, moderate, with mood disturbance: Secondary | ICD-10-CM

## 2023-07-27 MED ORDER — RISPERIDONE 0.5 MG PO TABS: 0.5000 mg | ORAL_TABLET | Freq: Two times a day (BID) | ORAL | 3 refills | Status: AC

## 2023-07-27 MED ORDER — DONEPEZIL HCL 10 MG PO TABS: 10.0000 mg | ORAL_TABLET | Freq: Every day | ORAL | 3 refills | Status: AC

## 2023-07-27 MED ORDER — QUETIAPINE FUMARATE 50 MG PO TABS: 50.0000 mg | ORAL_TABLET | Freq: Every day | ORAL | 3 refills | Status: AC

## 2023-07-27 MED ORDER — MEMANTINE HCL 10 MG PO TABS: 10.0000 mg | ORAL_TABLET | Freq: Two times a day (BID) | ORAL | 3 refills | Status: AC

## 2023-07-27 MED ORDER — BUSPIRONE HCL 15 MG PO TABS: 15.0000 mg | ORAL_TABLET | Freq: Two times a day (BID) | ORAL | 3 refills | Status: AC
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
## 2023-07-27 NOTE — Patient Instructions (Signed)
 Always a pleasure to see you. Continue all your medications.  Continue 24/7 care. Follow-up in 1 year, call for any changes.

## 2023-08-10 ENCOUNTER — Other Ambulatory Visit: Payer: Self-pay | Admitting: Internal Medicine

## 2023-08-12 NOTE — Progress Notes (Unsigned)
 Subjective:    Patient ID: [name], female    DOB: June 19, 1930, 89 y.o.   MRN: [id number]     HPI [name] is here for follow up of her chronic medical problems.  She is here with her daughter who provides the history due to the patient's dementia.  Overall [name] feels she is doing fairly well.  She has no complaints.  Medications and allergies reviewed with patient and updated if appropriate.  Current Outpatient Medications on File Prior to Visit  Medication Sig Dispense Refill   acetaminophen  (TYLENOL ) 500 MG tablet Take 1,000 mg by mouth 2 (two) times daily.      albuterol  (VENTOLIN  HFA) 108 (90 Base) MCG/ACT inhaler Inhale into the lungs every 6 (six) hours as needed for wheezing or shortness of breath.     amLODipine  (NORVASC ) 5 MG tablet TAKE 1 TABLET BY MOUTH TWICE A DAY 180 tablet 3   busPIRone  (BUSPAR ) 15 MG tablet Take 1 tablet (15 mg total) by mouth 2 (two) times daily. 180 tablet 3   carvedilol  (COREG ) 6.25 MG tablet TAKE 1 TABLET BY MOUTH 2 TIMES A DAY 30 tablet 0   Cetirizine  HCl 10 MG CAPS      donepezil  (ARICEPT ) 10 MG tablet Take 1 tablet (10 mg total) by mouth daily. 90 tablet 3   gabapentin  (NEURONTIN ) 300 MG capsule TAKE 1 CAPSULE BY MOUTH AT BEDTIME 90 capsule 3   memantine  (NAMENDA ) 10 MG tablet Take 1 tablet (10 mg total) by mouth 2 (two) times daily. 180 tablet 3   Menthol , Topical Analgesic, (BIOFREEZE) 4 % GEL Apply 1 Application topically in the morning, at noon, and at bedtime. To mid back and right wrist     omeprazole  (PRILOSEC) 20 MG capsule Take 2 capsules (40 mg total) by mouth daily. Take 40 mg by mouth daily. 30 capsule 5   QUEtiapine  (SEROQUEL ) 50 MG tablet Take 1 tablet (50 mg total) by mouth at bedtime. 90 tablet 3   risperiDONE  (RISPERDAL ) 0.5 MG tablet Take 1 tablet (0.5 mg total) by mouth 2 (two) times daily. 180 tablet 3   torsemide (DEMADEX) 10 MG tablet Take 10 mg by mouth daily.     traMADol  (ULTRAM ) 50 MG tablet Take 50  mg by mouth 3 (three) times daily as needed for moderate pain.     meloxicam  (MOBIC ) 7.5 MG tablet Take 7.5 mg by mouth daily.     No current facility-administered medications on file prior to visit.   Review of systems may not be 100% accurate secondary to patient's dementia, but still asked some questions  Review of Systems  Unable to perform ROS: Dementia  Constitutional:  Negative for appetite change.  Respiratory:  Negative for cough, shortness of breath and wheezing.   Cardiovascular:  Negative for chest pain, palpitations and leg swelling.  Gastrointestinal:  Negative for abdominal pain.  Musculoskeletal:  Positive for back pain.  Neurological:  Negative for headaches.  Psychiatric/Behavioral:  Negative for sleep disturbance.        Objective:   Vitals:   08/13/23 1350  BP: (!) 120/58  Pulse: 63  Temp: 98.3 F (36.8 C)  SpO2: 96%   BP Readings from Last 3 Encounters:  08/13/23 (!) 120/58  07/27/23 (!) 130/58  04/29/23 (!) 156/75   Wt Readings from Last 3 Encounters:  08/13/23 126 lb (57.2 kg)  07/27/23 124 lb 9.6 oz (56.5 kg)  04/29/23 121 lb 14.6 oz (55.3 kg)  Body mass index is 25.45 kg/m.    Physical Exam Constitutional:      General: She is not in acute distress.    Appearance: Normal appearance.  HENT:     Head: Normocephalic and atraumatic.  Eyes:     Conjunctiva/sclera: Conjunctivae normal.  Cardiovascular:     Rate and Rhythm: Normal rate and regular rhythm.     Heart sounds: Normal heart sounds.  Pulmonary:     Effort: Pulmonary effort is normal. No respiratory distress.     Breath sounds: Normal breath sounds. No wheezing.  Musculoskeletal:     Cervical back: Neck supple.     Right lower leg: No edema.     Left lower leg: No edema.  Lymphadenopathy:     Cervical: No cervical adenopathy.  Skin:    General: Skin is warm and dry.     Findings: No rash.  Neurological:     Mental Status: She is alert. Mental status is at baseline.   Psychiatric:        Mood and Affect: Mood normal.        Behavior: Behavior normal.        Lab Results  Component Value Date   WBC 6.0 04/27/2023   HGB 16.5 (H) 04/27/2023   HCT 50.7 (H) 04/27/2023   PLT 142 (L) 04/27/2023   GLUCOSE 150 (H) 04/29/2023   CHOL 217 (H) 09/05/2021   TRIG 114.0 09/05/2021   HDL 97.60 09/05/2021   LDLDIRECT 115.3 09/30/2009   LDLCALC 97 09/05/2021   ALT 19 04/28/2023   AST 17 04/28/2023   NA 134 (L) 04/29/2023   K 4.0 04/29/2023   CL 97 (L) 04/29/2023   CREATININE 1.00 04/29/2023   BUN 43 (H) 04/29/2023   CO2 26 04/29/2023   TSH 1.003 04/24/2023   INR 1.1 04/24/2023   HGBA1C 6.4 (H) 04/24/2023   MICROALBUR 1.5 04/02/2015     Assessment & Plan:    See Problem List for Assessment and Plan of chronic medical problems.

## 2023-08-12 NOTE — Patient Instructions (Incomplete)
      Medications changes include :   Stop ASA, iron  tablets, decrease omeprazole  to 20 mg daily

## 2023-08-13 ENCOUNTER — Ambulatory Visit: Admitting: Internal Medicine

## 2023-08-13 ENCOUNTER — Encounter: Payer: Self-pay | Admitting: Internal Medicine

## 2023-08-13 VITALS — BP 120/58 | HR 63 | Temp 98.3°F | Ht 59.0 in | Wt 126.0 lb

## 2023-08-13 DIAGNOSIS — F03918 Unspecified dementia, unspecified severity, with other behavioral disturbance: Secondary | ICD-10-CM

## 2023-08-13 DIAGNOSIS — K219 Gastro-esophageal reflux disease without esophagitis: Secondary | ICD-10-CM | POA: Diagnosis not present

## 2023-08-13 DIAGNOSIS — I10 Essential (primary) hypertension: Secondary | ICD-10-CM

## 2023-08-13 DIAGNOSIS — E1149 Type 2 diabetes mellitus with other diabetic neurological complication: Secondary | ICD-10-CM | POA: Diagnosis not present

## 2023-08-13 MED ORDER — OMEPRAZOLE 20 MG PO CPDR: 20.0000 mg | DELAYED_RELEASE_CAPSULE | Freq: Every day | ORAL | Status: DC

## 2023-08-13 MED ORDER — MELOXICAM 7.5 MG PO TABS: 7.5000 mg | ORAL_TABLET | Freq: Every day | ORAL | 3 refills | Status: AC

## 2023-08-13 NOTE — Assessment & Plan Note (Addendum)
 Chronic GERD controlled Decrease omeprazole  to 20 mg daily - would stay on omeprazole  if taking meloxicam  given hx UGIB

## 2023-08-13 NOTE — Assessment & Plan Note (Signed)
 Chronic Diet controlled-sugars hers have been well-controlled Lab Results  Component Value Date   HGBA1C 6.4 (H) 04/24/2023    No need for medication

## 2023-08-13 NOTE — Assessment & Plan Note (Signed)
 Chronic Following with Dr. Ty Gales Currently on Seroquel  50 mg at bedtime, Aricept  10 mg nightly, Namenda  10 mg twice daily, buspirone  15 mg twice daily, risperadone 0.5 mg twice daily

## 2023-08-13 NOTE — Assessment & Plan Note (Signed)
 Chronic Blood pressure well controlled Continue amlodipine  5 mg twice daily, carvedilol  6.25 mg twice daily May need to decrease medication depending on bp

## 2023-08-21 ENCOUNTER — Other Ambulatory Visit: Payer: Self-pay | Admitting: Internal Medicine

## 2023-10-21 ENCOUNTER — Telehealth: Payer: Self-pay | Admitting: Internal Medicine

## 2023-10-21 NOTE — Telephone Encounter (Signed)
 Patient's daughter left form for patient to be filled out by Dr. Geofm - please call when ready.

## 2023-10-22 ENCOUNTER — Encounter: Payer: Self-pay | Admitting: Internal Medicine

## 2023-10-25 NOTE — Telephone Encounter (Signed)
 Form received today.  My-chart sent to patient's daughter that Dr. Geofm is not back until 7/21.

## 2023-10-26 ENCOUNTER — Encounter: Payer: Self-pay | Admitting: Neurology

## 2023-12-17 ENCOUNTER — Other Ambulatory Visit: Payer: Self-pay | Admitting: Internal Medicine

## 2023-12-20 ENCOUNTER — Encounter: Payer: Self-pay | Admitting: Internal Medicine

## 2023-12-30 ENCOUNTER — Other Ambulatory Visit: Payer: Self-pay

## 2023-12-30 ENCOUNTER — Other Ambulatory Visit: Payer: Self-pay | Admitting: Internal Medicine

## 2023-12-30 MED ORDER — CARVEDILOL 6.25 MG PO TABS: 6.2500 mg | ORAL_TABLET | Freq: Two times a day (BID) | ORAL | 2 refills | Status: AC

## 2024-05-04 ENCOUNTER — Other Ambulatory Visit: Payer: Self-pay | Admitting: Internal Medicine
